# Patient Record
Sex: Male | Born: 1950 | Race: White | Hispanic: No | Smoking: Current every day smoker
Health system: Southern US, Community
[De-identification: ages and names within clinical notes are randomized; demographics above are authoritative.]

## PROBLEM LIST (undated history)

## (undated) DIAGNOSIS — R55 Syncope and collapse: Secondary | ICD-10-CM

## (undated) DIAGNOSIS — B1920 Unspecified viral hepatitis C without hepatic coma: Secondary | ICD-10-CM

## (undated) DIAGNOSIS — F13239 Sedative, hypnotic or anxiolytic dependence with withdrawal, unspecified: Secondary | ICD-10-CM

## (undated) DIAGNOSIS — F1320 Sedative, hypnotic or anxiolytic dependence, uncomplicated: Secondary | ICD-10-CM

## (undated) DIAGNOSIS — J449 Chronic obstructive pulmonary disease, unspecified: Secondary | ICD-10-CM

## (undated) DIAGNOSIS — F13939 Sedative, hypnotic or anxiolytic use, unspecified with withdrawal, unspecified: Secondary | ICD-10-CM

## (undated) DIAGNOSIS — I10 Essential (primary) hypertension: Secondary | ICD-10-CM

## (undated) DIAGNOSIS — R569 Unspecified convulsions: Secondary | ICD-10-CM

## (undated) DIAGNOSIS — C119 Malignant neoplasm of nasopharynx, unspecified: Secondary | ICD-10-CM

## (undated) DIAGNOSIS — F1010 Alcohol abuse, uncomplicated: Secondary | ICD-10-CM

## (undated) DIAGNOSIS — G4730 Sleep apnea, unspecified: Secondary | ICD-10-CM

## (undated) DIAGNOSIS — G8929 Other chronic pain: Secondary | ICD-10-CM

## (undated) DIAGNOSIS — F329 Major depressive disorder, single episode, unspecified: Secondary | ICD-10-CM

## (undated) DIAGNOSIS — M79671 Pain in right foot: Secondary | ICD-10-CM

## (undated) HISTORY — DX: Chronic obstructive pulmonary disease, unspecified: J44.9

## (undated) HISTORY — PX: ANKLE FRACTURE SURGERY: SHX122

## (undated) HISTORY — DX: Malignant neoplasm of nasopharynx, unspecified: C11.9

## (undated) HISTORY — PX: OTHER SURGICAL HISTORY: SHX169

## (undated) HISTORY — PX: COLONOSCOPY WITH PROPOFOL: SHX5780

## (undated) HISTORY — PX: NASOPHARYNGOSCOPY: SHX5210

## (undated) HISTORY — PX: FOOT SURGERY: SHX648

## (undated) HISTORY — PX: MYRINGOTOMY: SUR874

## (undated) HISTORY — PX: TONSILLECTOMY: SUR1361

## (undated) HISTORY — PX: LITHOTRIPSY: SUR834

## (undated) HISTORY — PX: HEMORRHOID SURGERY: SHX153

---

## 2002-03-12 HISTORY — PX: FOOT SURGERY: SHX648

## 2004-03-24 ENCOUNTER — Emergency Department: Payer: Self-pay | Admitting: Emergency Medicine

## 2004-03-24 ENCOUNTER — Other Ambulatory Visit: Payer: Self-pay

## 2004-07-23 ENCOUNTER — Ambulatory Visit: Payer: Self-pay | Admitting: Internal Medicine

## 2004-08-02 ENCOUNTER — Ambulatory Visit: Payer: Self-pay | Admitting: Internal Medicine

## 2006-01-10 ENCOUNTER — Other Ambulatory Visit: Payer: Self-pay

## 2006-01-11 ENCOUNTER — Inpatient Hospital Stay: Payer: Self-pay | Admitting: Internal Medicine

## 2006-02-18 ENCOUNTER — Ambulatory Visit: Payer: Self-pay | Admitting: Internal Medicine

## 2007-10-15 ENCOUNTER — Ambulatory Visit: Payer: Self-pay | Admitting: Pain Medicine

## 2007-10-21 ENCOUNTER — Ambulatory Visit: Payer: Self-pay | Admitting: Pain Medicine

## 2007-10-26 ENCOUNTER — Ambulatory Visit: Payer: Self-pay | Admitting: Pain Medicine

## 2007-11-26 ENCOUNTER — Ambulatory Visit: Payer: Self-pay | Admitting: Pain Medicine

## 2007-12-07 ENCOUNTER — Ambulatory Visit: Payer: Self-pay | Admitting: Pain Medicine

## 2008-01-12 ENCOUNTER — Ambulatory Visit: Payer: Self-pay | Admitting: Pain Medicine

## 2008-01-20 ENCOUNTER — Ambulatory Visit: Payer: Self-pay | Admitting: Pain Medicine

## 2008-02-16 ENCOUNTER — Ambulatory Visit: Payer: Self-pay | Admitting: Pain Medicine

## 2008-02-22 ENCOUNTER — Ambulatory Visit: Payer: Self-pay | Admitting: Pain Medicine

## 2008-03-13 ENCOUNTER — Ambulatory Visit: Payer: Self-pay | Admitting: Internal Medicine

## 2008-03-29 ENCOUNTER — Ambulatory Visit: Payer: Self-pay | Admitting: Internal Medicine

## 2008-03-31 ENCOUNTER — Ambulatory Visit: Payer: Self-pay | Admitting: Pain Medicine

## 2008-04-11 ENCOUNTER — Ambulatory Visit: Payer: Self-pay | Admitting: Pain Medicine

## 2008-04-12 ENCOUNTER — Ambulatory Visit: Payer: Self-pay | Admitting: Internal Medicine

## 2008-04-22 ENCOUNTER — Encounter: Payer: Self-pay | Admitting: Pain Medicine

## 2008-05-13 ENCOUNTER — Encounter: Payer: Self-pay | Admitting: Pain Medicine

## 2008-05-13 DIAGNOSIS — R569 Unspecified convulsions: Secondary | ICD-10-CM

## 2008-05-13 HISTORY — DX: Unspecified convulsions: R56.9

## 2008-05-17 ENCOUNTER — Ambulatory Visit: Payer: Self-pay | Admitting: Pain Medicine

## 2008-05-30 ENCOUNTER — Ambulatory Visit: Payer: Self-pay

## 2008-06-09 ENCOUNTER — Ambulatory Visit: Payer: Self-pay | Admitting: Pain Medicine

## 2008-08-01 ENCOUNTER — Ambulatory Visit: Payer: Self-pay | Admitting: Specialist

## 2008-08-29 ENCOUNTER — Ambulatory Visit: Payer: Self-pay | Admitting: Pain Medicine

## 2008-09-29 ENCOUNTER — Ambulatory Visit: Payer: Self-pay | Admitting: Pain Medicine

## 2009-05-09 ENCOUNTER — Emergency Department: Payer: Self-pay | Admitting: Emergency Medicine

## 2009-07-07 ENCOUNTER — Encounter (INDEPENDENT_AMBULATORY_CARE_PROVIDER_SITE_OTHER): Payer: Self-pay | Admitting: *Deleted

## 2009-07-13 ENCOUNTER — Observation Stay: Payer: Self-pay | Admitting: Internal Medicine

## 2009-12-01 ENCOUNTER — Inpatient Hospital Stay: Payer: Self-pay | Admitting: *Deleted

## 2009-12-04 ENCOUNTER — Inpatient Hospital Stay: Payer: Self-pay | Admitting: Unknown Physician Specialty

## 2010-02-14 ENCOUNTER — Telehealth: Payer: Self-pay | Admitting: Internal Medicine

## 2010-04-21 ENCOUNTER — Inpatient Hospital Stay: Payer: Self-pay | Admitting: Internal Medicine

## 2010-06-12 NOTE — Progress Notes (Signed)
Summary: Schedule Office Visit.   Phone Note Outgoing Call Call back at [facility] Phone [phone]   Call placed by: Harlow Mares CMA Duncan Dull),  February 14, 2010 9:50 AM Call placed to: Patient Summary of Call: patient lives in [place] and plans on asking his primary care MD to refer him to a GI in [place] to have his recall colonoscopy done.  Initial call taken by: Harlow Mares CMA (AAMA),  February 14, 2010 9:51 AM

## 2010-06-12 NOTE — Letter (Signed)
Summary: Office Visit Letter  [facility]  [address] [place], [place] [address]   Phone: [phone]  Fax: [phone]      July 07, 2009 MRN: [id number]   [facility] [address] [place], [place]  [address]   Dear Mr. KIESTER,   According to our records, it is time for you to schedule a follow-up office visit with [place].   At your convenience, please call [phone] (option #2)to schedule an office visit. If you have any questions, concerns, or feel that this letter is in error, we would appreciate your call.   Sincerely,  Iva Boop, M.D.  [facility] [phone]

## 2010-11-13 ENCOUNTER — Emergency Department: Payer: Self-pay | Admitting: Internal Medicine

## 2010-12-14 ENCOUNTER — Other Ambulatory Visit: Payer: Self-pay | Admitting: Diagnostic Neuroimaging

## 2010-12-14 DIAGNOSIS — R569 Unspecified convulsions: Secondary | ICD-10-CM

## 2010-12-14 DIAGNOSIS — R404 Transient alteration of awareness: Secondary | ICD-10-CM

## 2011-03-20 ENCOUNTER — Ambulatory Visit: Payer: Self-pay

## 2011-03-21 LAB — PSA: PSA: 0.4 ng/mL (ref 0.0–4.0)

## 2011-03-26 ENCOUNTER — Ambulatory Visit: Payer: Self-pay | Admitting: Internal Medicine

## 2011-08-08 DIAGNOSIS — I10 Essential (primary) hypertension: Secondary | ICD-10-CM | POA: Diagnosis not present

## 2011-08-08 DIAGNOSIS — E782 Mixed hyperlipidemia: Secondary | ICD-10-CM | POA: Diagnosis not present

## 2011-08-08 DIAGNOSIS — G40909 Epilepsy, unspecified, not intractable, without status epilepticus: Secondary | ICD-10-CM | POA: Diagnosis not present

## 2011-08-08 DIAGNOSIS — M25579 Pain in unspecified ankle and joints of unspecified foot: Secondary | ICD-10-CM | POA: Diagnosis not present

## 2011-08-08 DIAGNOSIS — F411 Generalized anxiety disorder: Secondary | ICD-10-CM | POA: Diagnosis not present

## 2011-08-08 DIAGNOSIS — B182 Chronic viral hepatitis C: Secondary | ICD-10-CM | POA: Diagnosis not present

## 2011-10-16 ENCOUNTER — Inpatient Hospital Stay: Payer: Self-pay | Admitting: Psychiatry

## 2011-10-16 DIAGNOSIS — F332 Major depressive disorder, recurrent severe without psychotic features: Secondary | ICD-10-CM | POA: Diagnosis not present

## 2011-10-16 LAB — COMPREHENSIVE METABOLIC PANEL
Albumin: 4.5 g/dL (ref 3.4–5.0)
Alkaline Phosphatase: 82 U/L (ref 50–136)
Anion Gap: 10 (ref 7–16)
Chloride: 100 mmol/L (ref 98–107)
Co2: 29 mmol/L (ref 21–32)
EGFR (African American): >60
EGFR (Non-African Amer.): >60
Glucose: 108 mg/dL — ABNORMAL HIGH (ref 65–99)
Potassium: 3.5 mmol/L (ref 3.5–5.1)
Total Protein: 8.4 g/dL — ABNORMAL HIGH (ref 6.4–8.2)

## 2011-10-16 LAB — URINALYSIS, COMPLETE
Bacteria: NONE SEEN
Ketone: NEGATIVE
Nitrite: NEGATIVE
Ph: 6 (ref 4.5–8.0)
RBC,UR: NONE SEEN /HPF (ref 0–5)
Specific Gravity: 1.005 (ref 1.003–1.030)
Squamous Epithelial: NONE SEEN

## 2011-10-16 LAB — CBC
HGB: 17.6 g/dL (ref 13.0–18.0)
MCH: 31.4 pg (ref 26.0–34.0)
MCHC: 33.4 g/dL (ref 32.0–36.0)
MCV: 94 fL (ref 80–100)
RBC: 5.6 10*6/uL (ref 4.40–5.90)
WBC: 8.1 10*3/uL (ref 3.8–10.6)

## 2011-10-16 LAB — DRUG SCREEN, URINE
Amphetamines, Ur Screen: NEGATIVE (ref ?–1000)
Barbiturates, Ur Screen: NEGATIVE (ref ?–200)
Benzodiazepine, Ur Scrn: POSITIVE (ref ?–200)
Cocaine Metabolite,Ur ~~LOC~~: NEGATIVE (ref ?–300)
MDMA (Ecstasy)Ur Screen: NEGATIVE (ref ?–500)
Methadone, Ur Screen: NEGATIVE (ref ?–300)
Opiate, Ur Screen: NEGATIVE (ref ?–300)
Phencyclidine (PCP) Ur S: NEGATIVE (ref ?–25)
Tricyclic, Ur Screen: NEGATIVE (ref ?–1000)

## 2011-10-16 LAB — TSH: Thyroid Stimulating Horm: 1.54 u[IU]/mL

## 2011-10-18 DIAGNOSIS — F332 Major depressive disorder, recurrent severe without psychotic features: Secondary | ICD-10-CM | POA: Diagnosis not present

## 2011-10-19 DIAGNOSIS — F332 Major depressive disorder, recurrent severe without psychotic features: Secondary | ICD-10-CM | POA: Diagnosis not present

## 2011-10-20 DIAGNOSIS — F332 Major depressive disorder, recurrent severe without psychotic features: Secondary | ICD-10-CM | POA: Diagnosis not present

## 2011-10-21 DIAGNOSIS — F332 Major depressive disorder, recurrent severe without psychotic features: Secondary | ICD-10-CM | POA: Diagnosis not present

## 2011-10-23 DIAGNOSIS — F332 Major depressive disorder, recurrent severe without psychotic features: Secondary | ICD-10-CM | POA: Diagnosis not present

## 2011-10-24 DIAGNOSIS — F332 Major depressive disorder, recurrent severe without psychotic features: Secondary | ICD-10-CM | POA: Diagnosis not present

## 2011-10-25 DIAGNOSIS — F332 Major depressive disorder, recurrent severe without psychotic features: Secondary | ICD-10-CM | POA: Diagnosis not present

## 2011-10-26 DIAGNOSIS — F331 Major depressive disorder, recurrent, moderate: Secondary | ICD-10-CM | POA: Diagnosis not present

## 2011-10-26 DIAGNOSIS — F4010 Social phobia, unspecified: Secondary | ICD-10-CM | POA: Diagnosis not present

## 2011-10-27 DIAGNOSIS — F331 Major depressive disorder, recurrent, moderate: Secondary | ICD-10-CM | POA: Diagnosis not present

## 2011-10-27 DIAGNOSIS — F4010 Social phobia, unspecified: Secondary | ICD-10-CM | POA: Diagnosis not present

## 2011-10-28 DIAGNOSIS — F332 Major depressive disorder, recurrent severe without psychotic features: Secondary | ICD-10-CM | POA: Diagnosis not present

## 2011-10-29 ENCOUNTER — Ambulatory Visit: Payer: Self-pay | Admitting: Unknown Physician Specialty

## 2011-10-30 DIAGNOSIS — F332 Major depressive disorder, recurrent severe without psychotic features: Secondary | ICD-10-CM | POA: Diagnosis not present

## 2011-10-30 DIAGNOSIS — F411 Generalized anxiety disorder: Secondary | ICD-10-CM | POA: Diagnosis not present

## 2011-11-06 DIAGNOSIS — F332 Major depressive disorder, recurrent severe without psychotic features: Secondary | ICD-10-CM | POA: Diagnosis not present

## 2011-11-06 DIAGNOSIS — F411 Generalized anxiety disorder: Secondary | ICD-10-CM | POA: Diagnosis not present

## 2011-11-20 DIAGNOSIS — F332 Major depressive disorder, recurrent severe without psychotic features: Secondary | ICD-10-CM | POA: Diagnosis not present

## 2011-11-20 DIAGNOSIS — F411 Generalized anxiety disorder: Secondary | ICD-10-CM | POA: Diagnosis not present

## 2011-11-21 ENCOUNTER — Ambulatory Visit: Payer: Self-pay | Admitting: Unknown Physician Specialty

## 2011-12-09 ENCOUNTER — Other Ambulatory Visit: Payer: Self-pay

## 2011-12-09 DIAGNOSIS — G40909 Epilepsy, unspecified, not intractable, without status epilepticus: Secondary | ICD-10-CM | POA: Diagnosis not present

## 2011-12-09 DIAGNOSIS — R5381 Other malaise: Secondary | ICD-10-CM | POA: Diagnosis not present

## 2011-12-09 DIAGNOSIS — E782 Mixed hyperlipidemia: Secondary | ICD-10-CM | POA: Diagnosis not present

## 2011-12-09 DIAGNOSIS — I10 Essential (primary) hypertension: Secondary | ICD-10-CM | POA: Diagnosis not present

## 2011-12-09 DIAGNOSIS — R300 Dysuria: Secondary | ICD-10-CM | POA: Diagnosis not present

## 2011-12-09 DIAGNOSIS — Z125 Encounter for screening for malignant neoplasm of prostate: Secondary | ICD-10-CM | POA: Diagnosis not present

## 2011-12-09 DIAGNOSIS — B182 Chronic viral hepatitis C: Secondary | ICD-10-CM | POA: Diagnosis not present

## 2011-12-09 DIAGNOSIS — F411 Generalized anxiety disorder: Secondary | ICD-10-CM | POA: Diagnosis not present

## 2011-12-09 DIAGNOSIS — Z0000 Encounter for general adult medical examination without abnormal findings: Secondary | ICD-10-CM | POA: Diagnosis not present

## 2011-12-09 DIAGNOSIS — M159 Polyosteoarthritis, unspecified: Secondary | ICD-10-CM | POA: Diagnosis not present

## 2011-12-09 LAB — COMPREHENSIVE METABOLIC PANEL
Alkaline Phosphatase: 102 U/L (ref 50–136)
BUN: 8 mg/dL (ref 7–18)
Chloride: 103 mmol/L (ref 98–107)
Co2: 29 mmol/L (ref 21–32)
Creatinine: 0.86 mg/dL (ref 0.60–1.30)
EGFR (Non-African Amer.): >60
SGOT(AST): 51 U/L — ABNORMAL HIGH (ref 15–37)
SGPT (ALT): 77 U/L
Total Protein: 6.8 g/dL (ref 6.4–8.2)

## 2011-12-09 LAB — CBC WITH DIFFERENTIAL/PLATELET
HGB: 14.7 g/dL (ref 13.0–18.0)
MCH: 31.9 pg (ref 26.0–34.0)
MCHC: 34.9 g/dL (ref 32.0–36.0)
Neutrophil #: 3.2 10*3/uL (ref 1.4–6.5)
Neutrophil %: 57.3 %
Platelet: 155 10*3/uL (ref 150–440)
RBC: 4.61 10*6/uL (ref 4.40–5.90)
RDW: 14.1 % (ref 11.5–14.5)
WBC: 5.6 10*3/uL (ref 3.8–10.6)

## 2011-12-09 LAB — LIPID PANEL
Cholesterol: 175 mg/dL (ref 0–200)
HDL Cholesterol: 56 mg/dL (ref 40–60)
Triglycerides: 158 mg/dL (ref 0–200)

## 2011-12-10 LAB — PSA: PSA: 0.4 ng/mL (ref 0.0–4.0)

## 2011-12-12 ENCOUNTER — Ambulatory Visit: Payer: Self-pay | Admitting: Unknown Physician Specialty

## 2012-01-04 DIAGNOSIS — M159 Polyosteoarthritis, unspecified: Secondary | ICD-10-CM | POA: Diagnosis not present

## 2012-01-04 DIAGNOSIS — Z79899 Other long term (current) drug therapy: Secondary | ICD-10-CM | POA: Diagnosis not present

## 2012-01-04 DIAGNOSIS — F411 Generalized anxiety disorder: Secondary | ICD-10-CM | POA: Diagnosis not present

## 2012-01-04 DIAGNOSIS — I10 Essential (primary) hypertension: Secondary | ICD-10-CM | POA: Diagnosis not present

## 2012-01-04 DIAGNOSIS — G40909 Epilepsy, unspecified, not intractable, without status epilepticus: Secondary | ICD-10-CM | POA: Diagnosis not present

## 2012-01-10 DIAGNOSIS — F411 Generalized anxiety disorder: Secondary | ICD-10-CM | POA: Diagnosis not present

## 2012-01-10 DIAGNOSIS — Z79899 Other long term (current) drug therapy: Secondary | ICD-10-CM | POA: Diagnosis not present

## 2012-01-10 DIAGNOSIS — I10 Essential (primary) hypertension: Secondary | ICD-10-CM | POA: Diagnosis not present

## 2012-01-10 DIAGNOSIS — G40909 Epilepsy, unspecified, not intractable, without status epilepticus: Secondary | ICD-10-CM | POA: Diagnosis not present

## 2012-01-10 DIAGNOSIS — M159 Polyosteoarthritis, unspecified: Secondary | ICD-10-CM | POA: Diagnosis not present

## 2012-01-24 DIAGNOSIS — I10 Essential (primary) hypertension: Secondary | ICD-10-CM | POA: Diagnosis not present

## 2012-01-24 DIAGNOSIS — Z79899 Other long term (current) drug therapy: Secondary | ICD-10-CM | POA: Diagnosis not present

## 2012-01-24 DIAGNOSIS — G40909 Epilepsy, unspecified, not intractable, without status epilepticus: Secondary | ICD-10-CM | POA: Diagnosis not present

## 2012-01-24 DIAGNOSIS — M159 Polyosteoarthritis, unspecified: Secondary | ICD-10-CM | POA: Diagnosis not present

## 2012-01-24 DIAGNOSIS — F411 Generalized anxiety disorder: Secondary | ICD-10-CM | POA: Diagnosis not present

## 2012-02-21 DIAGNOSIS — IMO0002 Reserved for concepts with insufficient information to code with codable children: Secondary | ICD-10-CM | POA: Diagnosis not present

## 2012-09-22 DIAGNOSIS — I10 Essential (primary) hypertension: Secondary | ICD-10-CM | POA: Diagnosis not present

## 2012-09-22 DIAGNOSIS — Z006 Encounter for examination for normal comparison and control in clinical research program: Secondary | ICD-10-CM | POA: Diagnosis not present

## 2012-09-22 DIAGNOSIS — G40909 Epilepsy, unspecified, not intractable, without status epilepticus: Secondary | ICD-10-CM | POA: Diagnosis not present

## 2012-09-22 DIAGNOSIS — F411 Generalized anxiety disorder: Secondary | ICD-10-CM | POA: Diagnosis not present

## 2012-09-22 DIAGNOSIS — B182 Chronic viral hepatitis C: Secondary | ICD-10-CM | POA: Diagnosis not present

## 2012-12-18 DIAGNOSIS — M19079 Primary osteoarthritis, unspecified ankle and foot: Secondary | ICD-10-CM | POA: Diagnosis not present

## 2012-12-18 DIAGNOSIS — G40909 Epilepsy, unspecified, not intractable, without status epilepticus: Secondary | ICD-10-CM | POA: Diagnosis not present

## 2012-12-18 DIAGNOSIS — I10 Essential (primary) hypertension: Secondary | ICD-10-CM | POA: Diagnosis not present

## 2012-12-18 DIAGNOSIS — F411 Generalized anxiety disorder: Secondary | ICD-10-CM | POA: Diagnosis not present

## 2012-12-18 DIAGNOSIS — Z0000 Encounter for general adult medical examination without abnormal findings: Secondary | ICD-10-CM | POA: Diagnosis not present

## 2013-01-15 DIAGNOSIS — F411 Generalized anxiety disorder: Secondary | ICD-10-CM | POA: Diagnosis not present

## 2013-01-15 DIAGNOSIS — M79609 Pain in unspecified limb: Secondary | ICD-10-CM | POA: Diagnosis not present

## 2013-01-15 DIAGNOSIS — Z79899 Other long term (current) drug therapy: Secondary | ICD-10-CM | POA: Diagnosis not present

## 2013-01-15 DIAGNOSIS — M159 Polyosteoarthritis, unspecified: Secondary | ICD-10-CM | POA: Diagnosis not present

## 2013-01-15 DIAGNOSIS — M948X9 Other specified disorders of cartilage, unspecified sites: Secondary | ICD-10-CM | POA: Diagnosis not present

## 2013-04-09 ENCOUNTER — Emergency Department: Payer: Self-pay | Admitting: Emergency Medicine

## 2013-04-09 DIAGNOSIS — F17200 Nicotine dependence, unspecified, uncomplicated: Secondary | ICD-10-CM | POA: Diagnosis not present

## 2013-04-09 DIAGNOSIS — T07XXXA Unspecified multiple injuries, initial encounter: Secondary | ICD-10-CM | POA: Diagnosis not present

## 2013-04-09 DIAGNOSIS — W19XXXA Unspecified fall, initial encounter: Secondary | ICD-10-CM | POA: Diagnosis not present

## 2013-04-09 DIAGNOSIS — R569 Unspecified convulsions: Secondary | ICD-10-CM | POA: Diagnosis not present

## 2013-04-09 DIAGNOSIS — S0990XA Unspecified injury of head, initial encounter: Secondary | ICD-10-CM | POA: Diagnosis not present

## 2013-04-09 DIAGNOSIS — M549 Dorsalgia, unspecified: Secondary | ICD-10-CM | POA: Diagnosis not present

## 2013-04-09 DIAGNOSIS — IMO0002 Reserved for concepts with insufficient information to code with codable children: Secondary | ICD-10-CM | POA: Diagnosis not present

## 2013-04-09 DIAGNOSIS — M129 Arthropathy, unspecified: Secondary | ICD-10-CM | POA: Diagnosis not present

## 2013-04-09 DIAGNOSIS — B1920 Unspecified viral hepatitis C without hepatic coma: Secondary | ICD-10-CM | POA: Diagnosis not present

## 2013-04-09 DIAGNOSIS — S0003XA Contusion of scalp, initial encounter: Secondary | ICD-10-CM | POA: Diagnosis not present

## 2013-04-09 DIAGNOSIS — G40909 Epilepsy, unspecified, not intractable, without status epilepticus: Secondary | ICD-10-CM | POA: Diagnosis not present

## 2013-04-09 DIAGNOSIS — R5381 Other malaise: Secondary | ICD-10-CM | POA: Diagnosis not present

## 2013-04-09 DIAGNOSIS — F3289 Other specified depressive episodes: Secondary | ICD-10-CM | POA: Diagnosis not present

## 2013-04-09 DIAGNOSIS — F329 Major depressive disorder, single episode, unspecified: Secondary | ICD-10-CM | POA: Diagnosis not present

## 2013-04-09 DIAGNOSIS — I10 Essential (primary) hypertension: Secondary | ICD-10-CM | POA: Diagnosis not present

## 2013-04-09 DIAGNOSIS — S0993XA Unspecified injury of face, initial encounter: Secondary | ICD-10-CM | POA: Diagnosis not present

## 2013-04-09 DIAGNOSIS — G8911 Acute pain due to trauma: Secondary | ICD-10-CM | POA: Diagnosis not present

## 2013-04-09 LAB — URINALYSIS, COMPLETE
Blood: NEGATIVE
Glucose,UR: NEGATIVE mg/dL (ref 0–75)
Nitrite: NEGATIVE
Ph: 5 (ref 4.5–8.0)
Protein: 30
RBC,UR: NONE SEEN /HPF (ref 0–5)
Specific Gravity: 1.024 (ref 1.003–1.030)
Squamous Epithelial: NONE SEEN
WBC UR: NONE SEEN /HPF (ref 0–5)

## 2013-04-09 LAB — CBC
HCT: 50.1 % (ref 40.0–52.0)
HGB: 17 g/dL (ref 13.0–18.0)
MCV: 91 fL (ref 80–100)
Platelet: 161 10*3/uL (ref 150–440)
RBC: 5.51 10*6/uL (ref 4.40–5.90)
RDW: 13.6 % (ref 11.5–14.5)
WBC: 12 10*3/uL — ABNORMAL HIGH (ref 3.8–10.6)

## 2013-04-09 LAB — BASIC METABOLIC PANEL
Anion Gap: 6 — ABNORMAL LOW (ref 7–16)
Chloride: 103 mmol/L (ref 98–107)
Co2: 27 mmol/L (ref 21–32)
Creatinine: 1.08 mg/dL (ref 0.60–1.30)
EGFR (African American): >60
EGFR (Non-African Amer.): >60
Sodium: 136 mmol/L (ref 136–145)

## 2013-04-09 LAB — ETHANOL
Ethanol %: <0.003 % (ref 0.000–0.080)
Ethanol: <3 mg/dL

## 2013-04-13 ENCOUNTER — Inpatient Hospital Stay: Payer: Self-pay | Admitting: Psychiatry

## 2013-04-13 DIAGNOSIS — F17200 Nicotine dependence, unspecified, uncomplicated: Secondary | ICD-10-CM | POA: Diagnosis not present

## 2013-04-13 DIAGNOSIS — R4182 Altered mental status, unspecified: Secondary | ICD-10-CM | POA: Diagnosis not present

## 2013-04-13 DIAGNOSIS — R443 Hallucinations, unspecified: Secondary | ICD-10-CM | POA: Diagnosis not present

## 2013-04-13 DIAGNOSIS — T424X4A Poisoning by benzodiazepines, undetermined, initial encounter: Secondary | ICD-10-CM | POA: Diagnosis not present

## 2013-04-13 DIAGNOSIS — E876 Hypokalemia: Secondary | ICD-10-CM | POA: Diagnosis not present

## 2013-04-13 LAB — URINALYSIS, COMPLETE
Bacteria: NONE SEEN
Bilirubin,UR: NEGATIVE
Blood: NEGATIVE
Glucose,UR: NEGATIVE mg/dL (ref 0–75)
Hyaline Cast: 13
Ketone: NEGATIVE
Leukocyte Esterase: NEGATIVE
Nitrite: NEGATIVE
Ph: 5 (ref 4.5–8.0)
Protein: 30
Specific Gravity: 1.025 (ref 1.003–1.030)
Squamous Epithelial: NONE SEEN

## 2013-04-13 LAB — COMPREHENSIVE METABOLIC PANEL
Albumin: 4 g/dL (ref 3.4–5.0)
Alkaline Phosphatase: 48 U/L
EGFR (African American): >60
EGFR (Non-African Amer.): >60
Sodium: 137 mmol/L (ref 136–145)
Total Protein: 7.5 g/dL (ref 6.4–8.2)

## 2013-04-13 LAB — DRUG SCREEN, URINE
Amphetamines, Ur Screen: NEGATIVE (ref ?–1000)
Barbiturates, Ur Screen: NEGATIVE (ref ?–200)
Cocaine Metabolite,Ur ~~LOC~~: NEGATIVE (ref ?–300)
MDMA (Ecstasy)Ur Screen: NEGATIVE (ref ?–500)
Tricyclic, Ur Screen: NEGATIVE (ref ?–1000)

## 2013-04-13 LAB — CBC
HCT: 49.3 % (ref 40.0–52.0)
HGB: 16.7 g/dL (ref 13.0–18.0)
MCV: 92 fL (ref 80–100)

## 2013-04-14 LAB — BEHAVIORAL MEDICINE 1 PANEL
Albumin: 4.1 g/dL (ref 3.4–5.0)
Alkaline Phosphatase: 51 U/L
Anion Gap: 8 (ref 7–16)
BUN: 13 mg/dL (ref 7–18)
Basophil #: 0 10*3/uL (ref 0.0–0.1)
Basophil %: 0.6 %
Bilirubin,Total: 1 mg/dL (ref 0.2–1.0)
Calcium, Total: 9.1 mg/dL (ref 8.5–10.1)
Chloride: 102 mmol/L (ref 98–107)
Co2: 27 mmol/L (ref 21–32)
Creatinine: 0.91 mg/dL (ref 0.60–1.30)
EGFR (African American): >60
EGFR (Non-African Amer.): >60
Eosinophil #: 0.3 10*3/uL (ref 0.0–0.7)
Eosinophil %: 3.5 %
Glucose: 119 mg/dL — ABNORMAL HIGH (ref 65–99)
HCT: 51.1 % (ref 40.0–52.0)
HGB: 17 g/dL (ref 13.0–18.0)
Lymphocyte #: 1.8 10*3/uL (ref 1.0–3.6)
Lymphocyte %: 25.1 %
MCH: 30.4 pg (ref 26.0–34.0)
MCHC: 33.2 g/dL (ref 32.0–36.0)
MCV: 92 fL (ref 80–100)
Monocyte #: 0.8 x10 3/mm (ref 0.2–1.0)
Monocyte %: 11.4 %
Neutrophil #: 4.3 10*3/uL (ref 1.4–6.5)
Neutrophil %: 59.4 %
Osmolality: 275 (ref 275–301)
Platelet: 146 10*3/uL — ABNORMAL LOW (ref 150–440)
Potassium: 3.4 mmol/L — ABNORMAL LOW (ref 3.5–5.1)
RBC: 5.58 10*6/uL (ref 4.40–5.90)
RDW: 13.3 % (ref 11.5–14.5)
SGOT(AST): 31 U/L (ref 15–37)
SGPT (ALT): 32 U/L (ref 12–78)
Sodium: 137 mmol/L (ref 136–145)
Thyroid Stimulating Horm: 1.35 u[IU]/mL
Total Protein: 7.8 g/dL (ref 6.4–8.2)
WBC: 7.2 10*3/uL (ref 3.8–10.6)

## 2013-04-17 DIAGNOSIS — F319 Bipolar disorder, unspecified: Secondary | ICD-10-CM | POA: Diagnosis not present

## 2013-04-18 DIAGNOSIS — F319 Bipolar disorder, unspecified: Secondary | ICD-10-CM | POA: Diagnosis not present

## 2013-04-21 ENCOUNTER — Ambulatory Visit: Payer: Self-pay | Admitting: Psychiatry

## 2013-05-14 ENCOUNTER — Ambulatory Visit: Payer: Self-pay | Admitting: Psychiatry

## 2013-06-07 ENCOUNTER — Observation Stay: Payer: Self-pay | Admitting: Internal Medicine

## 2013-06-07 DIAGNOSIS — R799 Abnormal finding of blood chemistry, unspecified: Secondary | ICD-10-CM | POA: Diagnosis not present

## 2013-06-07 DIAGNOSIS — R55 Syncope and collapse: Secondary | ICD-10-CM | POA: Diagnosis not present

## 2013-06-07 DIAGNOSIS — I10 Essential (primary) hypertension: Secondary | ICD-10-CM | POA: Diagnosis not present

## 2013-06-07 DIAGNOSIS — F17200 Nicotine dependence, unspecified, uncomplicated: Secondary | ICD-10-CM | POA: Diagnosis not present

## 2013-06-07 LAB — HEPATIC FUNCTION PANEL A (ARMC)
ALBUMIN: 4.3 g/dL (ref 3.4–5.0)
ALK PHOS: 79 U/L
BILIRUBIN DIRECT: 0.4 mg/dL — AB (ref 0.00–0.20)
Bilirubin,Total: 0.9 mg/dL (ref 0.2–1.0)
SGOT(AST): 42 U/L — ABNORMAL HIGH (ref 15–37)
SGPT (ALT): 31 U/L (ref 12–78)
TOTAL PROTEIN: 7.8 g/dL (ref 6.4–8.2)

## 2013-06-07 LAB — DRUG SCREEN, URINE
Amphetamines, Ur Screen: NEGATIVE (ref ?–1000)
Barbiturates, Ur Screen: NEGATIVE (ref ?–200)
Benzodiazepine, Ur Scrn: POSITIVE (ref ?–200)
Cannabinoid 50 Ng, Ur ~~LOC~~: NEGATIVE (ref ?–50)
Cocaine Metabolite,Ur ~~LOC~~: NEGATIVE (ref ?–300)
MDMA (ECSTASY) UR SCREEN: NEGATIVE (ref ?–500)
Methadone, Ur Screen: NEGATIVE (ref ?–300)
OPIATE, UR SCREEN: NEGATIVE (ref ?–300)
PHENCYCLIDINE (PCP) UR S: NEGATIVE (ref ?–25)
TRICYCLIC, UR SCREEN: NEGATIVE (ref ?–1000)

## 2013-06-07 LAB — URINALYSIS, COMPLETE
BLOOD: NEGATIVE
Bacteria: NONE SEEN
Bilirubin,UR: NEGATIVE
Glucose,UR: NEGATIVE mg/dL (ref 0–75)
Leukocyte Esterase: NEGATIVE
Nitrite: NEGATIVE
PH: 5 (ref 4.5–8.0)
Specific Gravity: 1.019 (ref 1.003–1.030)
Squamous Epithelial: <1
WBC UR: 5 /HPF (ref 0–5)

## 2013-06-07 LAB — CBC WITH DIFFERENTIAL/PLATELET
BASOS ABS: 0.1 10*3/uL (ref 0.0–0.1)
Basophil %: 0.6 %
EOS PCT: 0.6 %
Eosinophil #: 0.1 10*3/uL (ref 0.0–0.7)
HCT: 52.3 % — AB (ref 40.0–52.0)
HGB: 17.4 g/dL (ref 13.0–18.0)
LYMPHS ABS: 1.4 10*3/uL (ref 1.0–3.6)
LYMPHS PCT: 11.4 %
MCH: 30.8 pg (ref 26.0–34.0)
MCHC: 33.3 g/dL (ref 32.0–36.0)
MCV: 92 fL (ref 80–100)
MONO ABS: 1 x10 3/mm (ref 0.2–1.0)
Monocyte %: 8.4 %
NEUTROS PCT: 79 %
Neutrophil #: 9.5 10*3/uL — ABNORMAL HIGH (ref 1.4–6.5)
PLATELETS: 203 10*3/uL (ref 150–440)
RBC: 5.66 10*6/uL (ref 4.40–5.90)
RDW: 15.2 % — ABNORMAL HIGH (ref 11.5–14.5)
WBC: 12.1 10*3/uL — AB (ref 3.8–10.6)

## 2013-06-07 LAB — BASIC METABOLIC PANEL
Anion Gap: 6 — ABNORMAL LOW (ref 7–16)
BUN: 5 mg/dL — ABNORMAL LOW (ref 7–18)
CO2: 29 mmol/L (ref 21–32)
Calcium, Total: 9.4 mg/dL (ref 8.5–10.1)
Chloride: 99 mmol/L (ref 98–107)
Creatinine: 1.12 mg/dL (ref 0.60–1.30)
EGFR (African American): >60
EGFR (Non-African Amer.): >60
Glucose: 159 mg/dL — ABNORMAL HIGH (ref 65–99)
Osmolality: 269 (ref 275–301)
Potassium: 3.6 mmol/L (ref 3.5–5.1)
Sodium: 134 mmol/L — ABNORMAL LOW (ref 136–145)

## 2013-06-07 LAB — ETHANOL
Ethanol %: <0.003 % (ref 0.000–0.080)
Ethanol: <3 mg/dL

## 2013-06-07 LAB — TROPONIN I
Troponin-I: 0.08 ng/mL — ABNORMAL HIGH
Troponin-I: 0.16 ng/mL — ABNORMAL HIGH

## 2013-06-08 DIAGNOSIS — R55 Syncope and collapse: Secondary | ICD-10-CM | POA: Diagnosis not present

## 2013-06-08 DIAGNOSIS — R799 Abnormal finding of blood chemistry, unspecified: Secondary | ICD-10-CM | POA: Diagnosis not present

## 2013-06-08 DIAGNOSIS — R569 Unspecified convulsions: Secondary | ICD-10-CM | POA: Diagnosis not present

## 2013-06-08 DIAGNOSIS — I10 Essential (primary) hypertension: Secondary | ICD-10-CM | POA: Diagnosis not present

## 2013-06-08 LAB — CBC WITH DIFFERENTIAL/PLATELET
BASOS PCT: 0.4 %
Basophil #: 0 10*3/uL (ref 0.0–0.1)
EOS PCT: 1.3 %
Eosinophil #: 0.1 10*3/uL (ref 0.0–0.7)
HCT: 49.1 % (ref 40.0–52.0)
HGB: 16.3 g/dL (ref 13.0–18.0)
Lymphocyte #: 2.8 10*3/uL (ref 1.0–3.6)
Lymphocyte %: 26.2 %
MCH: 30.4 pg (ref 26.0–34.0)
MCHC: 33.2 g/dL (ref 32.0–36.0)
MCV: 92 fL (ref 80–100)
Monocyte #: 0.9 x10 3/mm (ref 0.2–1.0)
Monocyte %: 8.5 %
NEUTROS ABS: 6.7 10*3/uL — AB (ref 1.4–6.5)
Neutrophil %: 63.6 %
Platelet: 154 10*3/uL (ref 150–440)
RBC: 5.36 10*6/uL (ref 4.40–5.90)
RDW: 14.7 % — AB (ref 11.5–14.5)
WBC: 10.6 10*3/uL (ref 3.8–10.6)

## 2013-06-08 LAB — COMPREHENSIVE METABOLIC PANEL
ANION GAP: 0 — AB (ref 7–16)
Albumin: 3.5 g/dL (ref 3.4–5.0)
Alkaline Phosphatase: 66 U/L
BILIRUBIN TOTAL: 1.2 mg/dL — AB (ref 0.2–1.0)
BUN: 3 mg/dL — AB (ref 7–18)
Calcium, Total: 8.4 mg/dL — ABNORMAL LOW (ref 8.5–10.1)
Chloride: 105 mmol/L (ref 98–107)
Co2: 27 mmol/L (ref 21–32)
Creatinine: 0.85 mg/dL (ref 0.60–1.30)
GLUCOSE: 94 mg/dL (ref 65–99)
Osmolality: 261 (ref 275–301)
Potassium: 3.2 mmol/L — ABNORMAL LOW (ref 3.5–5.1)
SGOT(AST): 30 U/L (ref 15–37)
SGPT (ALT): 23 U/L (ref 12–78)
Sodium: 132 mmol/L — ABNORMAL LOW (ref 136–145)
Total Protein: 6.6 g/dL (ref 6.4–8.2)

## 2013-06-08 LAB — TROPONIN I: Troponin-I: 0.13 ng/mL — ABNORMAL HIGH

## 2013-06-08 LAB — CK: CK, Total: 162 U/L (ref 35–232)

## 2013-06-08 LAB — MAGNESIUM: MAGNESIUM: 1.7 mg/dL — AB

## 2013-06-10 DIAGNOSIS — R569 Unspecified convulsions: Secondary | ICD-10-CM | POA: Diagnosis not present

## 2013-06-13 ENCOUNTER — Ambulatory Visit: Payer: Self-pay | Admitting: Psychiatry

## 2013-06-14 ENCOUNTER — Emergency Department: Payer: Self-pay | Admitting: Emergency Medicine

## 2013-06-22 DIAGNOSIS — M19079 Primary osteoarthritis, unspecified ankle and foot: Secondary | ICD-10-CM | POA: Diagnosis not present

## 2013-06-22 DIAGNOSIS — F411 Generalized anxiety disorder: Secondary | ICD-10-CM | POA: Diagnosis not present

## 2013-06-22 DIAGNOSIS — G40909 Epilepsy, unspecified, not intractable, without status epilepticus: Secondary | ICD-10-CM | POA: Diagnosis not present

## 2013-06-22 DIAGNOSIS — E876 Hypokalemia: Secondary | ICD-10-CM | POA: Diagnosis not present

## 2013-06-22 DIAGNOSIS — R55 Syncope and collapse: Secondary | ICD-10-CM | POA: Diagnosis not present

## 2013-06-22 DIAGNOSIS — I10 Essential (primary) hypertension: Secondary | ICD-10-CM | POA: Diagnosis not present

## 2013-07-13 DIAGNOSIS — G40909 Epilepsy, unspecified, not intractable, without status epilepticus: Secondary | ICD-10-CM | POA: Diagnosis not present

## 2013-08-24 DIAGNOSIS — E876 Hypokalemia: Secondary | ICD-10-CM | POA: Diagnosis not present

## 2013-08-24 DIAGNOSIS — I10 Essential (primary) hypertension: Secondary | ICD-10-CM | POA: Diagnosis not present

## 2013-08-24 DIAGNOSIS — M25579 Pain in unspecified ankle and joints of unspecified foot: Secondary | ICD-10-CM | POA: Diagnosis not present

## 2013-08-24 DIAGNOSIS — G40909 Epilepsy, unspecified, not intractable, without status epilepticus: Secondary | ICD-10-CM | POA: Diagnosis not present

## 2013-08-24 DIAGNOSIS — M19079 Primary osteoarthritis, unspecified ankle and foot: Secondary | ICD-10-CM | POA: Diagnosis not present

## 2013-10-20 DIAGNOSIS — M159 Polyosteoarthritis, unspecified: Secondary | ICD-10-CM | POA: Diagnosis not present

## 2013-10-20 DIAGNOSIS — E782 Mixed hyperlipidemia: Secondary | ICD-10-CM | POA: Diagnosis not present

## 2013-10-20 DIAGNOSIS — G40909 Epilepsy, unspecified, not intractable, without status epilepticus: Secondary | ICD-10-CM | POA: Diagnosis not present

## 2013-10-20 DIAGNOSIS — I10 Essential (primary) hypertension: Secondary | ICD-10-CM | POA: Diagnosis not present

## 2013-10-20 DIAGNOSIS — M25579 Pain in unspecified ankle and joints of unspecified foot: Secondary | ICD-10-CM | POA: Diagnosis not present

## 2013-10-20 DIAGNOSIS — G4700 Insomnia, unspecified: Secondary | ICD-10-CM | POA: Diagnosis not present

## 2013-10-20 DIAGNOSIS — F411 Generalized anxiety disorder: Secondary | ICD-10-CM | POA: Diagnosis not present

## 2013-11-26 DIAGNOSIS — F411 Generalized anxiety disorder: Secondary | ICD-10-CM | POA: Diagnosis not present

## 2013-11-26 DIAGNOSIS — E782 Mixed hyperlipidemia: Secondary | ICD-10-CM | POA: Diagnosis not present

## 2013-11-26 DIAGNOSIS — G40909 Epilepsy, unspecified, not intractable, without status epilepticus: Secondary | ICD-10-CM | POA: Diagnosis not present

## 2013-11-26 DIAGNOSIS — I10 Essential (primary) hypertension: Secondary | ICD-10-CM | POA: Diagnosis not present

## 2013-11-26 DIAGNOSIS — M19079 Primary osteoarthritis, unspecified ankle and foot: Secondary | ICD-10-CM | POA: Diagnosis not present

## 2013-11-26 DIAGNOSIS — IMO0001 Reserved for inherently not codable concepts without codable children: Secondary | ICD-10-CM | POA: Diagnosis not present

## 2013-11-26 DIAGNOSIS — B182 Chronic viral hepatitis C: Secondary | ICD-10-CM | POA: Diagnosis not present

## 2013-11-29 ENCOUNTER — Emergency Department (HOSPITAL_COMMUNITY): Payer: Medicare Other

## 2013-11-29 ENCOUNTER — Observation Stay (HOSPITAL_COMMUNITY)
Admission: EM | Admit: 2013-11-29 | Discharge: 2013-11-30 | Disposition: A | Discharge status: Home / Self Care | Payer: Medicare Other | Attending: Internal Medicine | Admitting: Internal Medicine

## 2013-11-29 ENCOUNTER — Encounter (HOSPITAL_COMMUNITY): Payer: Self-pay | Admitting: Emergency Medicine

## 2013-11-29 ENCOUNTER — Observation Stay (HOSPITAL_COMMUNITY): Payer: Medicare Other

## 2013-11-29 DIAGNOSIS — B1920 Unspecified viral hepatitis C without hepatic coma: Secondary | ICD-10-CM | POA: Diagnosis not present

## 2013-11-29 DIAGNOSIS — E669 Obesity, unspecified: Secondary | ICD-10-CM | POA: Diagnosis present

## 2013-11-29 DIAGNOSIS — F19921 Other psychoactive substance use, unspecified with intoxication with delirium: Secondary | ICD-10-CM

## 2013-11-29 DIAGNOSIS — Z6830 Body mass index (BMI) 30.0-30.9, adult: Secondary | ICD-10-CM | POA: Insufficient documentation

## 2013-11-29 DIAGNOSIS — F1010 Alcohol abuse, uncomplicated: Secondary | ICD-10-CM | POA: Diagnosis present

## 2013-11-29 DIAGNOSIS — F1320 Sedative, hypnotic or anxiolytic dependence, uncomplicated: Secondary | ICD-10-CM

## 2013-11-29 DIAGNOSIS — F411 Generalized anxiety disorder: Secondary | ICD-10-CM | POA: Insufficient documentation

## 2013-11-29 DIAGNOSIS — R9389 Abnormal findings on diagnostic imaging of other specified body structures: Secondary | ICD-10-CM | POA: Diagnosis present

## 2013-11-29 DIAGNOSIS — R4789 Other speech disturbances: Secondary | ICD-10-CM | POA: Diagnosis not present

## 2013-11-29 DIAGNOSIS — R4182 Altered mental status, unspecified: Secondary | ICD-10-CM | POA: Diagnosis not present

## 2013-11-29 DIAGNOSIS — G40909 Epilepsy, unspecified, not intractable, without status epilepticus: Secondary | ICD-10-CM | POA: Diagnosis not present

## 2013-11-29 DIAGNOSIS — F13931 Sedative, hypnotic or anxiolytic use, unspecified with withdrawal delirium: Secondary | ICD-10-CM

## 2013-11-29 DIAGNOSIS — F13939 Sedative, hypnotic or anxiolytic use, unspecified with withdrawal, unspecified: Secondary | ICD-10-CM | POA: Diagnosis present

## 2013-11-29 DIAGNOSIS — F13231 Sedative, hypnotic or anxiolytic dependence with withdrawal delirium: Secondary | ICD-10-CM

## 2013-11-29 DIAGNOSIS — F4489 Other dissociative and conversion disorders: Secondary | ICD-10-CM | POA: Diagnosis not present

## 2013-11-29 DIAGNOSIS — J984 Other disorders of lung: Secondary | ICD-10-CM | POA: Diagnosis not present

## 2013-11-29 DIAGNOSIS — G8929 Other chronic pain: Secondary | ICD-10-CM | POA: Diagnosis not present

## 2013-11-29 DIAGNOSIS — I10 Essential (primary) hypertension: Secondary | ICD-10-CM | POA: Diagnosis present

## 2013-11-29 DIAGNOSIS — G9349 Other encephalopathy: Principal | ICD-10-CM | POA: Insufficient documentation

## 2013-11-29 DIAGNOSIS — F17200 Nicotine dependence, unspecified, uncomplicated: Secondary | ICD-10-CM | POA: Diagnosis present

## 2013-11-29 DIAGNOSIS — F13239 Sedative, hypnotic or anxiolytic dependence with withdrawal, unspecified: Secondary | ICD-10-CM | POA: Diagnosis present

## 2013-11-29 DIAGNOSIS — F19239 Other psychoactive substance dependence with withdrawal, unspecified: Secondary | ICD-10-CM

## 2013-11-29 DIAGNOSIS — R918 Other nonspecific abnormal finding of lung field: Secondary | ICD-10-CM | POA: Diagnosis not present

## 2013-11-29 DIAGNOSIS — R569 Unspecified convulsions: Secondary | ICD-10-CM | POA: Diagnosis not present

## 2013-11-29 DIAGNOSIS — R51 Headache: Secondary | ICD-10-CM | POA: Diagnosis not present

## 2013-11-29 DIAGNOSIS — R6889 Other general symptoms and signs: Secondary | ICD-10-CM | POA: Diagnosis not present

## 2013-11-29 DIAGNOSIS — F19939 Other psychoactive substance use, unspecified with withdrawal, unspecified: Secondary | ICD-10-CM

## 2013-11-29 HISTORY — DX: Sedative, hypnotic or anxiolytic dependence, uncomplicated: F13.20

## 2013-11-29 HISTORY — DX: Sedative, hypnotic or anxiolytic use, unspecified with withdrawal, unspecified: F13.939

## 2013-11-29 HISTORY — DX: Other chronic pain: G89.29

## 2013-11-29 HISTORY — DX: Unspecified viral hepatitis C without hepatic coma: B19.20

## 2013-11-29 HISTORY — DX: Alcohol abuse, uncomplicated: F10.10

## 2013-11-29 HISTORY — DX: Unspecified convulsions: R56.9

## 2013-11-29 HISTORY — DX: Essential (primary) hypertension: I10

## 2013-11-29 HISTORY — DX: Sedative, hypnotic or anxiolytic dependence with withdrawal, unspecified: F13.239

## 2013-11-29 HISTORY — DX: Pain in right foot: M79.671

## 2013-11-29 HISTORY — DX: Epilepsy, unspecified, not intractable, without status epilepticus: G40.909

## 2013-11-29 LAB — URINE MICROSCOPIC-ADD ON

## 2013-11-29 LAB — URINALYSIS, ROUTINE W REFLEX MICROSCOPIC
Glucose, UA: NEGATIVE mg/dL
Hgb urine dipstick: NEGATIVE
Ketones, ur: >80 mg/dL — AB
NITRITE: NEGATIVE
PH: 5.5 (ref 5.0–8.0)
Protein, ur: 30 mg/dL — AB
SPECIFIC GRAVITY, URINE: 1.022 (ref 1.005–1.030)
Urobilinogen, UA: 2 mg/dL — ABNORMAL HIGH (ref 0.0–1.0)

## 2013-11-29 LAB — COMPREHENSIVE METABOLIC PANEL
ALT: 64 U/L — ABNORMAL HIGH (ref 0–53)
ANION GAP: 18 — AB (ref 5–15)
AST: 44 U/L — AB (ref 0–37)
Albumin: 4.1 g/dL (ref 3.5–5.2)
Alkaline Phosphatase: 61 U/L (ref 39–117)
BUN: 10 mg/dL (ref 6–23)
CALCIUM: 8.7 mg/dL (ref 8.4–10.5)
CO2: 23 meq/L (ref 19–32)
Chloride: 100 mEq/L (ref 96–112)
Creatinine, Ser: 0.87 mg/dL (ref 0.50–1.35)
Glucose, Bld: 107 mg/dL — ABNORMAL HIGH (ref 70–99)
Potassium: 4 mEq/L (ref 3.7–5.3)
Sodium: 141 mEq/L (ref 137–147)
Total Bilirubin: 1 mg/dL (ref 0.3–1.2)
Total Protein: 7.7 g/dL (ref 6.0–8.3)

## 2013-11-29 LAB — CBC WITH DIFFERENTIAL/PLATELET
BASOS ABS: 0 10*3/uL (ref 0.0–0.1)
Basophils Relative: 0 % (ref 0–1)
EOS PCT: 0 % (ref 0–5)
Eosinophils Absolute: 0 10*3/uL (ref 0.0–0.7)
HEMATOCRIT: 51 % (ref 39.0–52.0)
Hemoglobin: 17.5 g/dL — ABNORMAL HIGH (ref 13.0–17.0)
LYMPHS PCT: 10 % — AB (ref 12–46)
Lymphs Abs: 1.1 10*3/uL (ref 0.7–4.0)
MCH: 32.3 pg (ref 26.0–34.0)
MCHC: 34.3 g/dL (ref 30.0–36.0)
MCV: 94.1 fL (ref 78.0–100.0)
MONO ABS: 0.8 10*3/uL (ref 0.1–1.0)
Monocytes Relative: 7 % (ref 3–12)
Neutro Abs: 9 10*3/uL — ABNORMAL HIGH (ref 1.7–7.7)
Neutrophils Relative %: 83 % — ABNORMAL HIGH (ref 43–77)
Platelets: 121 10*3/uL — ABNORMAL LOW (ref 150–400)
RBC: 5.42 MIL/uL (ref 4.22–5.81)
RDW: 14.8 % (ref 11.5–15.5)
WBC: 10.9 10*3/uL — AB (ref 4.0–10.5)

## 2013-11-29 LAB — ETHANOL

## 2013-11-29 LAB — RAPID URINE DRUG SCREEN, HOSP PERFORMED
Amphetamines: NOT DETECTED
BENZODIAZEPINES: POSITIVE — AB
Barbiturates: NOT DETECTED
COCAINE: NOT DETECTED
Opiates: NOT DETECTED
Tetrahydrocannabinol: NOT DETECTED

## 2013-11-29 LAB — AMMONIA: Ammonia: 20 umol/L (ref 11–60)

## 2013-11-29 MED ORDER — CHLORDIAZEPOXIDE HCL 5 MG PO CAPS
25.0000 mg | ORAL_CAPSULE | Freq: Three times a day (TID) | ORAL | Status: AC
  Administered 2013-11-29 – 2013-11-30 (×2): 25 mg via ORAL
  Filled 2013-11-29 (×2): qty 5

## 2013-11-29 MED ORDER — LEVETIRACETAM IN NACL 500 MG/100ML IV SOLN
500.0000 mg | Freq: Once | INTRAVENOUS | Status: AC
  Administered 2013-11-29: 500 mg via INTRAVENOUS
  Filled 2013-11-29: qty 100

## 2013-11-29 MED ORDER — ONDANSETRON HCL 4 MG/2ML IJ SOLN: 4.0000 mg | Freq: Four times a day (QID) | INTRAMUSCULAR | Status: DC | PRN

## 2013-11-29 MED ORDER — LORAZEPAM 2 MG/ML IJ SOLN
1.0000 mg | Freq: Once | INTRAMUSCULAR | Status: AC
  Administered 2013-11-29: 1 mg via INTRAVENOUS
  Filled 2013-11-29: qty 1

## 2013-11-29 MED ORDER — SODIUM CHLORIDE 0.9 % IV SOLN
INTRAVENOUS | Status: AC
  Administered 2013-11-29: 20:00:00 via INTRAVENOUS

## 2013-11-29 MED ORDER — LEVETIRACETAM 500 MG PO TABS
500.0000 mg | ORAL_TABLET | Freq: Two times a day (BID) | ORAL | Status: DC
  Administered 2013-11-29 – 2013-11-30 (×2): 500 mg via ORAL
  Filled 2013-11-29 (×2): qty 1

## 2013-11-29 MED ORDER — LORAZEPAM 2 MG/ML IJ SOLN
INTRAMUSCULAR | Status: AC
  Administered 2013-11-29: 1 mg via INTRAVENOUS
  Filled 2013-11-29: qty 1

## 2013-11-29 MED ORDER — SODIUM CHLORIDE 0.9 % IV SOLN
INTRAVENOUS | Status: DC
  Administered 2013-11-29: 13:00:00 via INTRAVENOUS

## 2013-11-29 MED ORDER — OXYCODONE-ACETAMINOPHEN 5-325 MG PO TABS
1.0000 | ORAL_TABLET | Freq: Four times a day (QID) | ORAL | Status: DC | PRN
  Administered 2013-11-29 – 2013-11-30 (×2): 1 via ORAL
  Filled 2013-11-29 (×2): qty 1

## 2013-11-29 MED ORDER — LORAZEPAM 2 MG/ML IJ SOLN
1.0000 mg | Freq: Four times a day (QID) | INTRAMUSCULAR | Status: DC | PRN
Start: 2013-11-29 — End: 2013-11-30
  Administered 2013-11-29 – 2013-11-30 (×2): 1 mg via INTRAVENOUS
  Filled 2013-11-29 (×2): qty 1

## 2013-11-29 MED ORDER — ONDANSETRON HCL 4 MG PO TABS: 4.0000 mg | ORAL_TABLET | Freq: Four times a day (QID) | ORAL | Status: DC | PRN

## 2013-11-29 MED ORDER — CHLORDIAZEPOXIDE HCL 5 MG PO CAPS: 25.0000 mg | ORAL_CAPSULE | Freq: Every day | ORAL | Status: DC

## 2013-11-29 MED ORDER — LORAZEPAM 2 MG/ML IJ SOLN
INTRAMUSCULAR | Status: AC
  Administered 2013-11-29: 1 mg
  Filled 2013-11-29: qty 1

## 2013-11-29 MED ORDER — AMLODIPINE BESYLATE 2.5 MG PO TABS
5.0000 mg | ORAL_TABLET | Freq: Every day | ORAL | Status: DC
  Administered 2013-11-30: 5 mg via ORAL
  Filled 2013-11-29: qty 2

## 2013-11-29 MED ORDER — ENOXAPARIN SODIUM 40 MG/0.4ML ~~LOC~~ SOLN
40.0000 mg | SUBCUTANEOUS | Status: DC
  Administered 2013-11-29: 40 mg via SUBCUTANEOUS
  Filled 2013-11-29: qty 0.4

## 2013-11-29 NOTE — ED Notes (Signed)
Md Camelo (Neurology) at bedside

## 2013-11-29 NOTE — ED Notes (Signed)
Contacted pt placement about wait time.

## 2013-11-29 NOTE — ED Notes (Signed)
Pt noted to have twitching to his legs. Pt asked if he thought he was having a seizure and pt states "yes, this is it." Pt noted to have twitching progressively move up body with twitching to abdomen, then to bilateral arms and then to eye lips. Pt remains verbal, follows commands, PERRLA, no eye deviation noted. Airway intact.

## 2013-11-29 NOTE — H&P (Signed)
History and Physical  [name] JAS:[id number] DOB: Dec 21, 1950 DOA: 11/29/2013  Referring physician: Lurene Shadow, ER physician PCP: Lavera Guise, MD   Chief Complaint: Confusion  HPI: [name] is a 62 y.o. male  Past medical history of anxiety disorder on Xanax, seizure disorder, and hepatitis C who presented to the emergency room today after being brought in after acting confused. Apparently, the patient has a history of anxiety and exam next 2 mg 3 times a day but ran out several days ago. He was driving to his pharmacy for refill but then stated that he became lost and somewhat confused and pulled into a construction site. There he reportedly was unable to talk appropriately and was sent to the emergency room via EMS. In the emergency room, patient was noted to have trembling and lower extremities working its way up through the torso and to the arms. This required IV Ativan and Keppra. During these episodes, patient is able to interact, but it is often times not 100% accurate. ER workup was noteworthy for some bilirubin in his urine, and normal electrolytes, renal function, CBC, CT scan of head.  Patient also noted to have a urine drug screen positive for benzodiazepines. Neurology evaluated the patient and felt seizures with secondary benzo withdrawal and hospitalists were called for further evaluation.   Review of Systems:  Patient seen  after arrival to floor. Pt complains of  fatigue.  Pt denies any  headache, vision changes, dysphagia, chest pain, palpitations, shortness of breath, wheeze, cough, abdominal pain, hematuria, dysuria, constipation, diarrhea, focal extremity numbness or weakness or pain other than some chronic right foot pain..  Review of systems are otherwise negative  Past Medical History  Diagnosis Date  . Hypertension   . Chronic pain in right foot   . Seizures 2010  . Benzodiazepine dependence   . Benzodiazepine withdrawal   . Alcohol  abuse   . Hepatitis C    Past Surgical History  Procedure Laterality Date  . Lithotripsy    . Hemorrhoid surgery     Social History:  reports that he has been smoking Cigarettes.  He has been smoking about 1.00 pack per day. He does not have any smokeless tobacco history on file. He reports that he drinks alcohol. He reports that he does not use illicit drugs. Patient lives at  home by himself & is able to participate in activities of daily living with out assistance  Allergies  Allergen Reactions  . Opana [Oxymorphone Hcl]     Made him BLACKOUT  . Sulfur     Childhood reaction     Family history: Discussed with patient. Her blood pressure runs in his family.  Prior to Admission medications   Medication Sig Start Date End Date Taking? Authorizing Provider  ALPRAZolam Duanne Moron) 1 MG tablet Take 1 mg by mouth 3 (three) times daily as needed for anxiety.  06/16/09  Yes Historical Provider, MD  amLODipine (NORVASC) 5 MG tablet Take 5 mg by mouth daily. 06/16/09  Yes Historical Provider, MD  levETIRAcetam (KEPPRA) 500 MG tablet Take 500 mg by mouth 2 (two) times daily.   Yes Historical Provider, MD  Multiple Vitamin (MULTIVITAMIN WITH MINERALS) TABS tablet Take 1 tablet by mouth daily.   Yes Historical Provider, MD  oxyCODONE-acetaminophen (PERCOCET/ROXICET) 5-325 MG per tablet Take 1 tablet by mouth every 6 (six) hours as needed for moderate pain.  08/08/09  Yes Historical Provider, MD    Physical Exam: BP 146/75  Pulse 90  Temp(Src) 99.6 F (37.6 C) (Oral)  Resp 24  Ht 5\' 10"  (1.778 m)  Wt 97.523 kg (215 lb)  BMI 30.85 kg/m2  SpO2 99%  General:  Alert and oriented x3, no acute distress Eyes:  Sclera, mildly icteric, extraocular movements are intact ENT:  Normocephalic, atraumatic, mucous membranes are slightly dry Neck:  No JVD Cardiovascular:  Regular rate and rhythm, S1-S2 Respiratory:  Clear to auscultation bilaterally Abdomen:  Soft, nontender, nondistended, positive bowel  sounds Skin:  No skin breaks, tears or lesions Musculoskeletal:  No clubbing or cyanosis or edema Psychiatric:  Patient is appropriate, no evidence of psychoses Neurologic:  No focal deficits. Strength is overall slightly diminished 5/5, but symmetric. Downgoing toes          Labs on Admission:  Basic Metabolic Panel:  Recent Labs Lab 11/29/13 1505  NA 141  K 4.0  CL 100  CO2 23  GLUCOSE 107*  BUN 10  CREATININE 0.87  CALCIUM 8.7   Liver Function Tests:  Recent Labs Lab 11/29/13 1505  AST 44*  ALT 64*  ALKPHOS 61  BILITOT 1.0  PROT 7.7  ALBUMIN 4.1   No results found for this basename: LIPASE, AMYLASE,  in the last 168 hours No results found for this basename: AMMONIA,  in the last 168 hours CBC:  Recent Labs Lab 11/29/13 1235  WBC 10.9*  NEUTROABS 9.0*  HGB 17.5*  HCT 51.0  MCV 94.1  PLT 121*   Cardiac Enzymes: No results found for this basename: CKTOTAL, CKMB, CKMBINDEX, TROPONINI,  in the last 168 hours  BNP (last 3 results) No results found for this basename: PROBNP,  in the last 8760 hours CBG: No results found for this basename: GLUCAP,  in the last 168 hours  Radiological Exams on Admission: Dg Chest 2 View  11/29/2013   CLINICAL DATA:  Altered mental status.  History of seizures.  EXAM: CHEST  2 VIEW  COMPARISON:  None.  FINDINGS: Cardiopericardial silhouette within normal limits. Mediastinal contours normal. Trachea midline. No airspace disease or effusion.  Nodular densities are present over both lung bases. These potentially represent nipple shadows. Repeat frontal view of the chest recommended with nipple markers as the next step in assessment.  IMPRESSION: 1. No acute cardiopulmonary disease. 2. Bilateral lower lobe pulmonary nodules which may represent nipple shadows. Repeat frontal view of the chest is recommended with nipple markers as the next step in assessment.   Electronically Signed   By: Dereck Ligas M.D.   On: 11/29/2013 13:12    Ct Head Wo Contrast  11/29/2013   CLINICAL DATA:  Altered mental status.  Headache and slurred speech.  EXAM: CT HEAD WITHOUT CONTRAST  TECHNIQUE: Contiguous axial images were obtained from the base of the skull through the vertex without intravenous contrast.  COMPARISON:  None.  FINDINGS: There is mild cerebral atrophy. There is no evidence of acute cortical infarct, intracranial hemorrhage, mass, midline shift, or extra-axial fluid collection. Patchy hypodensities in the periventricular white matter are nonspecific but compatible with mild chronic small vessel ischemic disease.  Orbits are unremarkable. Mastoid air cells are clear. Mild mucosal thickening is noted in the frontal and maxillary sinuses.  IMPRESSION: No evidence of acute intracranial abnormality.   Electronically Signed   By: Logan Bores   On: 11/29/2013 13:32    EKG: Independently reviewed.  Sinus rhythm, left anterior fascicular block  Assessment/Plan Present on Admission:  . Seizures: Secondary to benzodiazepine withdrawal  .  Benzodiazepine withdrawal: Patient is normally on 2 mg 3 times a day, not 1 mg as stated in his med rec. I spoke to his pharmacy. She last filled his prescription on June 22. He states that he ran out of his prescription on Saturday. By my count, he still should have had 4 or 5 days of a 30 day supply. Suspect that he likely may be abusing his benzodiazepines slightly. I have put him on Librium taper plus when necessary Ativan for acute seizures. It is of note, the patient has a waiting Ativan prescription for him at his pharmacy for pickup  . Seizure disorder: Continue Keppra.  . Alcohol abuse: Patient states that he used to use alcohol at after going to rehabilitation, and now he only has a couple drinks a day and his last drink was several days ago. Monitor closely for alcohol withdrawal  . Hypertension: Continue Norvasc  . Abnormal chest x-ray: Questionable nodule seen on chest x-ray. Repeat in the  morning with 2 view  . Obesity: Patient. BMI greater than 30   history of hepatitis C: Followed by his PCP. Will check ammonia level to ensure that this is not playing underlying role initial confusion, the patient is appropriate now.  . Tobacco use disorder: Patient declined nicotine patch   Consultants:  Neurology  Code Status:  Full code  Family Communication:  Patient declined for me to call his family   Disposition Plan:  Home if no further episodes tomorrow  Time spent:  35 minutes  [facility] Pager [phone]  **Disclaimer: This note may have been dictated with voice recognition software. Similar sounding words can inadvertently be transcribed and this note may contain transcription errors which may not have been corrected upon publication of note.**

## 2013-11-29 NOTE — ED Notes (Signed)
Per EMS: Pt was driving when he started to experience a posterior HA and states "I just didn't feel well." Pt then tried to turn around, but pulled into a construction site. Construction workers state that when pt got out of car that they couldn't understand what the pt was saying and had slurred speech. Pt states "I just couldn't get my words out." On EMS arrival, pt AO x4. NIH 0. VSS. Hx: seizures, HTN

## 2013-11-29 NOTE — ED Notes (Signed)
Per neurology notes for pt, pt has hx of seizures dt benzodiazepine withdraw. Pt states he hasn't taken Xanax since Friday.

## 2013-11-29 NOTE — ED Notes (Signed)
During triage process, pt noted to have seizure like activity. When EMS was in room pt was AO x4, following all commands and was able to slide himself over to other bed. After appx 1 minute pt noted to become confused and slow to answer questions. Pt had twitching to bilateral arms and legs with occasional jerking. PT still verbal, but is slow to answer questions. Md Thurnell Garbe made aware and at bedside assessing. HR noted to increase to 110 ST.

## 2013-11-29 NOTE — Consult Note (Signed)
Reason for Consult:seizures Referring Physician: Dr Thurnell Garbe CC: seizures  HPI: [name] is a 63 y.o. 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 The patient takes Xanax, usually twice daily for his anxiety disorder. He ran out of this medication several days ago and was driving to his pharmacy for a refill. He became lost and somewhat confused and ended up in [place]. He pulled into a construction site to turn around and when he attempted to speak to the construction worker's he felt as if he knew what he wanted to say but was unable to get the words out. The workers felt that there was something wrong and called EMS. The patient was brought to the emergency department.   The nursing staff noted that he appeared to be having seizure activity which started as a trembling in both lower extremities and worked its way up through his body and into his arms. He received 1 mg of IV Ativan on 2 separate occasions in the emergency department as well as Keppra 500 mg IV x1. When I saw the patient he was having difficulty answering questions. He would stop in midsentence occasionally pulling his sheet above his head and randomly moving his arms and legs. During these episodes the patient stated that he felt as if he knew what was going on around him but could not respond. Later these activities stopped and I was able to get a history.   He receives most of his medical care in [place]. He states his seizure started 3-4 years ago. His last seizure prior to today occurred last February when he "fell out" on the way to his physician's office. He denies ever missing any doses of Keppra.  A CT of his head today showed no acute findings. A chest x-ray showed possible bilateral lower lobe pulmonary nodules with followup  recommended.  Past Medical History  Diagnosis Date  . Hypertension   . Chronic pain in right foot   . Seizures 2010  . Benzodiazepine dependence   . Benzodiazepine withdrawal   . Alcohol abuse   . Hepatitis C     Past Surgical History  Procedure Laterality Date  . Lithotripsy    . Hemorrhoid surgery     Family History: The patient's mother is alive and well. His father has hypertension and had a "mini stroke" last year.  Social History:  reports that he has been smoking Cigarettes.  He has been smoking about 1.00 pack per day. He does not have any smokeless tobacco history on file. He reports that he drinks alcohol, usually several beers per day. He reports that he does not use illicit drugs.  Allergies  Allergen Reactions  . Opana [Oxymorphone Hcl]   . Sulfur     Medications:  Scheduled: . levETIRAcetam  500 mg Intravenous Once   Current Facility-Administered Medications  Medication Dose Route Frequency Provider Last Rate Last Dose  . 0.9 %  sodium chloride infusion   Intravenous Continuous Alfonzo Feller, DO 75 mL/hr at 11/29/13 1242    . levETIRAcetam (KEPPRA) IVPB 500 mg/100 mL premix  500 mg Intravenous Once Alfonzo Feller, DO   500 mg at 11/29/13 1432   Current Outpatient Prescriptions  Medication Sig Dispense Refill  . ALPRAZolam (XANAX) 1 MG tablet Take 1 mg by mouth.      Marland Kitchen  amLODipine (NORVASC) 5 MG tablet Take 5 mg by mouth daily.      Marland Kitchen oxyCODONE-acetaminophen (PERCOCET/ROXICET) 5-325 MG per tablet Take 1 tablet by mouth.         ROS: History obtained from the patient  General ROS: negative for - chills, fatigue, fever, night sweats, weight gain or weight loss Psychological ROS: negative for - behavioral disorder, hallucinations, memory difficulties, mood swings or suicidal ideation Ophthalmic ROS: negative for - blurry vision, double vision, eye pain or loss of vision ENT ROS: negative for - epistaxis, nasal discharge, oral lesions, sore throat,  tinnitus or vertigo Allergy and Immunology ROS: negative for - hives or itchy/watery eyes Hematological and Lymphatic ROS: negative for - bleeding problems, bruising or swollen lymph nodes Endocrine ROS: negative for - galactorrhea, hair pattern changes, polydipsia/polyuria or temperature intolerance Respiratory ROS: negative for - cough, hemoptysis, shortness of breath or wheezing Cardiovascular ROS: negative for - chest pain, dyspnea on exertion, edema or irregular heartbeat Gastrointestinal ROS: negative for - abdominal pain, diarrhea, hematemesis, nausea/vomiting or stool incontinence Genito-Urinary ROS: negative for - dysuria, hematuria, incontinence or urinary frequency/urgency Musculoskeletal ROS: Positive for flulike symptoms and achiness yesterday. Neurological ROS: as noted in HPI Dermatological ROS: negative for rash and skin lesion changes   Physical Examination: Blood pressure 135/81, pulse 91, temperature 98.3 F (36.8 C), temperature source Oral, resp. rate 23, height 5\' 10"  (1.778 m), weight 215 lb (97.523 kg), SpO2 94.00%.  Neurologic Examination Mental Status: Alert, oriented, thought content appropriate.  Speech fluent without evidence of aphasia.  Able to follow 3 step commands without difficulty. Cranial Nerves: II: Discs not visualized; Visual fields grossly normal, pupils equal, round, reactive to light and accommodation III,IV, VI: ptosis not present, extra-ocular motions intact bilaterally V,VII: smile symmetric, facial light touch sensation normal bilaterally VIII: hearing normal bilaterally IX,X: gag reflex present XI: bilateral shoulder shrug XII: midline tongue extension Motor: Right : Upper extremity   5/5    Left:     Upper extremity   5/5  Lower extremity   5/5     Lower extremity   5/5 Tone and bulk:normal tone throughout; no atrophy noted Sensory: Pinprick and light touch intact throughout, bilaterally Deep Tendon Reflexes: 2+ and symmetric  throughout Plantars: Right: downgoing   Left: downgoing Cerebellar: normal finger-to-nose, normal rapid alternating movements and normal heel-to-shin test Gait: Deferred at this time for safety reasons   Laboratory Studies:   Basic Metabolic Panel: No results found for this basename: NA, K, CL, CO2, GLUCOSE, BUN, CREATININE, CALCIUM, MG, PHOS,  in the last 168 hours  Liver Function Tests: No results found for this basename: AST, ALT, ALKPHOS, BILITOT, PROT, ALBUMIN,  in the last 168 hours No results found for this basename: LIPASE, AMYLASE,  in the last 168 hours No results found for this basename: AMMONIA,  in the last 168 hours  CBC:  Recent Labs Lab 11/29/13 1235  WBC 10.9*  NEUTROABS 9.0*  HGB 17.5*  HCT 51.0  MCV 94.1  PLT 121*    Cardiac Enzymes: No results found for this basename: CKTOTAL, CKMB, CKMBINDEX, TROPONINI,  in the last 168 hours  BNP: No components found with this basename: POCBNP,   CBG: No results found for this basename: GLUCAP,  in the last 168 hours  Microbiology: No results found for this or any previous visit.  Coagulation Studies: No results found for this basename: LABPROT, INR,  in the last 72 hours  Urinalysis: No results found for this basename:  COLORURINE, APPERANCEUR, LABSPEC, [place], GLUCOSEU, HGBUR, BILIRUBINUR, KETONESUR, PROTEINUR, UROBILINOGEN, NITRITE, LEUKOCYTESUR,  in the last 168 hours  Lipid Panel:  No results found for this basename: chol, trig, hdl, cholhdl, vldl, ldlcalc    HgbA1C:  No results found for this basename: HGBA1C    Urine Drug Screen:   No results found for this basename: labopia, cocainscrnur, labbenz, amphetmu, thcu, labbarb    Alcohol Level: No results found for this basename: ETH,  in the last 168 hours  Other results: EKG: SR rate 88 BPM. Please see formal reading for complete details.  Imaging:  Dg Chest 2 View 11/29/2013    1. No acute cardiopulmonary disease.  2. Bilateral lower lobe  pulmonary nodules which may represent nipple shadows. Repeat frontal view of the chest is recommended with nipple markers as the next step in assessment.      Ct Head Wo Contrast 11/29/2013    No evidence of acute intracranial abnormality.     Assessment/Plan:  63 year old male history of a seizure disorder, anxiety disorder, and alcohol use who recently ran out of his Xanax and was on his way to his pharmacy in [place] to have the medication refilled when he became confused and disoriented. He ended up driving to [facility] and developed speech difficulties while trying to communicate with some construction worker's. EMS was summoned and the patient was brought to the emergency department at [facility]. In the emergency department the patient appear to be having seizure-like activity. He was given IV Ativan IV Keppra and the symptoms improved.  Further assessment and plan to follow per Dr. Armida Sans.   Mikey Bussing PA-C [facility] Pager [phone] 11/29/2013, 3:51 PM   Addendum: Patient seen and evaluated with physician assistant. Mr. Broady had had prior seizures in the setting of xanax withdrawal and his seizures today are strongly suggestive of recurrent provoked xanax withdraw seizures. He has been on keppra as per his outside neurologist and I will not make any changes in that regard as I have not all details regarding prior EEG's and seizure work up. Instead, will suggest management of his benzo withdraw syndrome as per medicine team. His mental status is back to baseline now and thus no further neurological intervention needed at this moment.  Donzetta Kohut Camilo,MD

## 2013-11-29 NOTE — ED Notes (Signed)
Admitting MD at bedside.

## 2013-11-29 NOTE — Progress Notes (Addendum)
Pt called out to nurse stating "its starting again." Shaking and twitching noted in bilateral arms and legs. Pt had episodes of expressive aphasia. During this time patient able to follow commands, and talk to the nurse.  PRN medication administered. This episode lasted approximately 5 minutes, patient able to form words and sentences at this time with minimal jerking noted, neurologically remains intact. On call notified of episode.  Will continue to monitor. Verdie Drown RN BSN

## 2013-11-29 NOTE — ED Provider Notes (Signed)
CSN: [id number]     Arrival date & time 11/29/13  1201 History   First MD Initiated Contact with Patient 11/29/13 1210     Chief Complaint  Patient presents with  . Seizures      HPI Pt was seen at 1215. Per EMS and pt report, c/o sudden onset and resolution of one episode of "not feeling well" that began PTA. Pt states he was driving and "just didn't feel well." Pt states he pulled the car off to the side of the road where witnesses state they "couldn't understand what he was saying" and "wasn't walking right." Pt states "I think I had a seizure." EMS states pt was A&O on their arrival to scene, neuro exam was intact/NIH 0. Pt was not incontinent of bowel/bladder, no fall, no LOC/syncope, no AMS. Pt recalls events PTA. Pt states he has hx seizures, rx keppra. Endorses his LD keppra was this morning, LD xanax "Friday or Saturday." Denies CP/SOB, no abd pain, no N/V/D, no focal motor weakness, no tingling/numbness in extremities, no fevers.    Past Medical History  Diagnosis Date  . Hypertension   . Chronic pain in right foot   . Seizures 2010  . Benzodiazepine dependence   . Benzodiazepine withdrawal   . Alcohol abuse   . Hepatitis C    Past Surgical History  Procedure Laterality Date  . Lithotripsy    . Hemorrhoid surgery      History  Substance Use Topics  . Smoking status: Current Every Day Smoker -- 1.00 packs/day    Types: Cigarettes  . Smokeless tobacco: Not on file  . Alcohol Use: Yes    Review of Systems ROS: Statement: All systems negative except as marked or noted in the HPI; Constitutional: Negative for fever and chills. ; ; Eyes: Negative for eye pain, redness and discharge. ; ; ENMT: Negative for ear pain, hoarseness, nasal congestion, sinus pressure and sore throat. ; ; Cardiovascular: Negative for chest pain, palpitations, diaphoresis, dyspnea and peripheral edema. ; ; Respiratory: Negative for cough, wheezing and stridor. ; ; Gastrointestinal: Negative for  nausea, vomiting, diarrhea, abdominal pain, blood in stool, hematemesis, jaundice and rectal bleeding. . ; ; Genitourinary: Negative for dysuria, flank pain and hematuria. ; ; Musculoskeletal: Negative for back pain and neck pain. Negative for swelling and trauma.; ; Skin: Negative for pruritus, rash, abrasions, blisters, bruising and skin lesion.; ; Neuro: +seizure. Negative for headache, lightheadedness and neck stiffness. Negative for weakness, altered level of consciousness , extremity weakness, paresthesias, syncope.      Allergies  Opana and Sulfur  Home Medications   Prior to Admission medications   Medication Sig Start Date End Date Taking? Authorizing Provider  ALPRAZolam Duanne Moron) 1 MG tablet Take 1 mg by mouth 3 (three) times daily as needed for anxiety.  06/16/09  Yes Historical Provider, MD  amLODipine (NORVASC) 5 MG tablet Take 5 mg by mouth daily. 06/16/09  Yes Historical Provider, MD  levETIRAcetam (KEPPRA) 500 MG tablet Take 500 mg by mouth 2 (two) times daily.   Yes Historical Provider, MD  Multiple Vitamin (MULTIVITAMIN WITH MINERALS) TABS tablet Take 1 tablet by mouth daily.   Yes Historical Provider, MD  oxyCODONE-acetaminophen (PERCOCET/ROXICET) 5-325 MG per tablet Take 1 tablet by mouth every 6 (six) hours as needed for moderate pain.  08/08/09  Yes Historical Provider, MD   BP 119/77  Pulse 97  Temp(Src) 98.3 F (36.8 C) (Oral)  Resp 17  Ht 5\' 10"  (1.778 m)  Wt 215 lb (97.523 kg)  BMI 30.85 kg/m2  SpO2 98% Physical Exam 1220: Physical examination:  Nursing notes reviewed; Vital signs and O2 SAT reviewed;  Constitutional: Well developed, Well nourished, Well hydrated, In no acute distress; Head:  Normocephalic, atraumatic; Eyes: EOMI, PERRL, No scleral icterus; ENMT: Mouth and pharynx normal, Mucous membranes moist; Neck: Supple, Full range of motion, No lymphadenopathy; Cardiovascular: Regular rate and rhythm, No gallop; Respiratory: Breath sounds clear & equal  bilaterally, No wheezes.  Speaking full sentences with ease, Normal respiratory effort/excursion; Chest: Nontender, Movement normal; Abdomen: Soft, Nontender, Nondistended, Normal bowel sounds; Genitourinary: No CVA tenderness; Extremities: Pulses normal, No tenderness, No edema, No calf edema or asymmetry.; Neuro: AA&Ox3, Major CN grossly intact. No facial droop. Speech clear. No gross focal motor or sensory deficits in extremities.; Skin: Color normal, Warm, Dry.   ED Course  Procedures     EKG Interpretation   Date/Time:  Monday November 29 2013 12:12:08 EDT Ventricular Rate:  88 PR Interval:  131 QRS Duration: 89 QT Interval:  337 QTC Calculation: 408 R Axis:   -63 Text Interpretation:  Sinus rhythm Left axis deviation Left anterior  fascicular block Abnormal R-wave progression, early transition No old  tracing to compare Confirmed by [facility]  MD, Nunzio Cory [phone]) on  11/29/2013 12:27:30 PM      MDM  MDM Reviewed: previous chart, nursing note and vitals Reviewed previous: labs Interpretation: labs, x-ray and CT scan   Results for orders placed during the hospital encounter of 11/29/13  URINALYSIS, ROUTINE W REFLEX MICROSCOPIC      Result Value Ref Range   Color, Urine AMBER (*) YELLOW   APPearance CLEAR  CLEAR   Specific Gravity, Urine 1.022  1.005 - 1.030   pH 5.5  5.0 - 8.0   Glucose, UA NEGATIVE  NEGATIVE mg/dL   Hgb urine dipstick NEGATIVE  NEGATIVE   Bilirubin Urine MODERATE (*) NEGATIVE   Ketones, ur >80 (*) NEGATIVE mg/dL   Protein, ur 30 (*) NEGATIVE mg/dL   Urobilinogen, UA 2.0 (*) 0.0 - 1.0 mg/dL   Nitrite NEGATIVE  NEGATIVE   Leukocytes, UA SMALL (*) NEGATIVE  URINE RAPID DRUG SCREEN (HOSP PERFORMED)      Result Value Ref Range   Opiates NONE DETECTED  NONE DETECTED   Cocaine NONE DETECTED  NONE DETECTED   Benzodiazepines POSITIVE (*) NONE DETECTED   Amphetamines NONE DETECTED  NONE DETECTED   Tetrahydrocannabinol NONE DETECTED  NONE DETECTED    Barbiturates NONE DETECTED  NONE DETECTED  CBC WITH DIFFERENTIAL      Result Value Ref Range   WBC 10.9 (*) 4.0 - 10.5 K/uL   RBC 5.42  4.22 - 5.81 MIL/uL   Hemoglobin 17.5 (*) 13.0 - 17.0 g/dL   HCT 51.0  39.0 - 52.0 %   MCV 94.1  78.0 - 100.0 fL   MCH 32.3  26.0 - 34.0 pg   MCHC 34.3  30.0 - 36.0 g/dL   RDW 14.8  11.5 - 15.5 %   Platelets 121 (*) 150 - 400 K/uL   Neutrophils Relative % 83 (*) 43 - 77 %   Neutro Abs 9.0 (*) 1.7 - 7.7 K/uL   Lymphocytes Relative 10 (*) 12 - 46 %   Lymphs Abs 1.1  0.7 - 4.0 K/uL   Monocytes Relative 7  3 - 12 %   Monocytes Absolute 0.8  0.1 - 1.0 K/uL   Eosinophils Relative 0  0 - 5 %   Eosinophils  Absolute 0.0  0.0 - 0.7 K/uL   Basophils Relative 0  0 - 1 %   Basophils Absolute 0.0  0.0 - 0.1 K/uL  COMPREHENSIVE METABOLIC PANEL      Result Value Ref Range   Sodium 141  137 - 147 mEq/L   Potassium 4.0  3.7 - 5.3 mEq/L   Chloride 100  96 - 112 mEq/L   CO2 23  19 - 32 mEq/L   Glucose, Bld 107 (*) 70 - 99 mg/dL   BUN 10  6 - 23 mg/dL   Creatinine, Ser 0.87  0.50 - 1.35 mg/dL   Calcium 8.7  8.4 - 10.5 mg/dL   Total Protein 7.7  6.0 - 8.3 g/dL   Albumin 4.1  3.5 - 5.2 g/dL   AST 44 (*) 0 - 37 U/L   ALT 64 (*) 0 - 53 U/L   Alkaline Phosphatase 61  39 - 117 U/L   Total Bilirubin 1.0  0.3 - 1.2 mg/dL   GFR calc non Af Amer >90  >90 mL/min   GFR calc Af Amer >90  >90 mL/min   Anion gap 18 (*) 5 - 15  ETHANOL      Result Value Ref Range   Alcohol, Ethyl (B) <11  0 - 11 mg/dL  URINE MICROSCOPIC-ADD ON      Result Value Ref Range   Squamous Epithelial / LPF RARE  RARE   WBC, UA 3-6  <3 WBC/hpf   Bacteria, UA FEW (*) RARE   Casts HYALINE CASTS (*) NEGATIVE   Urine-Other MUCOUS PRESENT     Dg Chest 2 View 11/29/2013   CLINICAL DATA:  Altered mental status.  History of seizures.  EXAM: CHEST  2 VIEW  COMPARISON:  None.  FINDINGS: Cardiopericardial silhouette within normal limits. Mediastinal contours normal. Trachea midline. No airspace disease or  effusion.  Nodular densities are present over both lung bases. These potentially represent nipple shadows. Repeat frontal view of the chest recommended with nipple markers as the next step in assessment.  IMPRESSION: 1. No acute cardiopulmonary disease. 2. Bilateral lower lobe pulmonary nodules which may represent nipple shadows. Repeat frontal view of the chest is recommended with nipple markers as the next step in assessment.   Electronically Signed   By: Dereck Ligas M.D.   On: 11/29/2013 13:12   Ct Head Wo Contrast 11/29/2013   CLINICAL DATA:  Altered mental status.  Headache and slurred speech.  EXAM: CT HEAD WITHOUT CONTRAST  TECHNIQUE: Contiguous axial images were obtained from the base of the skull through the vertex without intravenous contrast.  COMPARISON:  None.  FINDINGS: There is mild cerebral atrophy. There is no evidence of acute cortical infarct, intracranial hemorrhage, mass, midline shift, or extra-axial fluid collection. Patchy hypodensities in the periventricular white matter are nonspecific but compatible with mild chronic small vessel ischemic disease.  Orbits are unremarkable. Mastoid air cells are clear. Mild mucosal thickening is noted in the frontal and maxillary sinuses.  IMPRESSION: No evidence of acute intracranial abnormality.   Electronically Signed   By: Logan Bores   On: 11/29/2013 13:32    1425:  Pt has had 2 seizures while in the ED: shaking begins with bilat LE's, progresses up body to bilat arms, then head/face. Pt did not have LOC or AMS. Both episodes resolved after IV ativan. Pt remained A&O, speaking with ED staff, throughout the seizures and after the seizures. No post-ictal state. IV keppra dose ordered.  [facility]  Clinic records received: pt has hx of seizures, have been previously attributed to benzodiazepine withdrawal, or possible etoh withdrawal.  T/C to Neuro Dr. Armida Sans, case discussed, including:  HPI, pertinent PM/SHx, VS/PE, dx testing, ED course  and treatment:  Agreeable with ED treatment, will consult, requests to admit to medicine service.   1600:  No further seizures since last dose of IV ativan and dose of IV keppra. Dx and testing d/w pt.  Questions answered.  Verb understanding, agreeable to admit. T/C to [facility]. [place] Pink, case discussed, including:  HPI, pertinent PM/SHx, VS/PE, dx testing, ED course and treatment:  Agreeable to admit, requests to write temporary orders, obtain observation tele bed to team 10.    Alfonzo Feller, DO 12/02/13 1627

## 2013-11-29 NOTE — ED Notes (Signed)
PT is now AO x4. Aware of seizure. No injury or incontinence noted. Pt states "I feel like I had a seizure."

## 2013-11-30 DIAGNOSIS — E669 Obesity, unspecified: Secondary | ICD-10-CM

## 2013-11-30 DIAGNOSIS — B1920 Unspecified viral hepatitis C without hepatic coma: Secondary | ICD-10-CM

## 2013-11-30 DIAGNOSIS — F1010 Alcohol abuse, uncomplicated: Secondary | ICD-10-CM

## 2013-11-30 DIAGNOSIS — I10 Essential (primary) hypertension: Secondary | ICD-10-CM

## 2013-11-30 DIAGNOSIS — G9349 Other encephalopathy: Secondary | ICD-10-CM | POA: Diagnosis not present

## 2013-11-30 DIAGNOSIS — F17200 Nicotine dependence, unspecified, uncomplicated: Secondary | ICD-10-CM

## 2013-11-30 LAB — CBC
HCT: 46.9 % (ref 39.0–52.0)
Hemoglobin: 15.6 g/dL (ref 13.0–17.0)
MCH: 31.7 pg (ref 26.0–34.0)
MCHC: 33.3 g/dL (ref 30.0–36.0)
MCV: 95.3 fL (ref 78.0–100.0)
Platelets: 109 10*3/uL — ABNORMAL LOW (ref 150–400)
RBC: 4.92 MIL/uL (ref 4.22–5.81)
RDW: 14.6 % (ref 11.5–15.5)
WBC: 8.2 10*3/uL (ref 4.0–10.5)

## 2013-11-30 LAB — BASIC METABOLIC PANEL
ANION GAP: 11 (ref 5–15)
BUN: 10 mg/dL (ref 6–23)
CALCIUM: 8.1 mg/dL — AB (ref 8.4–10.5)
CO2: 26 meq/L (ref 19–32)
CREATININE: 0.82 mg/dL (ref 0.50–1.35)
Chloride: 102 mEq/L (ref 96–112)
GFR calc Af Amer: >90 mL/min (ref >90)
GFR calc non Af Amer: >90 mL/min (ref >90)
Glucose, Bld: 104 mg/dL — ABNORMAL HIGH (ref 70–99)
Potassium: 3.4 mEq/L — ABNORMAL LOW (ref 3.7–5.3)
Sodium: 139 mEq/L (ref 137–147)

## 2013-11-30 LAB — URINE CULTURE: Colony Count: 10000

## 2013-11-30 MED ORDER — ALPRAZOLAM 0.5 MG PO TABS: 1.0000 mg | ORAL_TABLET | Freq: Three times a day (TID) | ORAL | Status: DC | PRN

## 2013-11-30 MED ORDER — CHLORDIAZEPOXIDE HCL 10 MG PO CAPS: ORAL_CAPSULE | ORAL | Status: DC

## 2013-11-30 MED ORDER — POTASSIUM CHLORIDE CRYS ER 20 MEQ PO TBCR
40.0000 meq | EXTENDED_RELEASE_TABLET | Freq: Once | ORAL | Status: AC
  Administered 2013-11-30: 40 meq via ORAL
  Filled 2013-11-30: qty 2

## 2013-11-30 NOTE — Progress Notes (Signed)
Pt had episode of confusion, and slurred speech lasting two minutes. No jerking or spastic movement noted. Pt was unable to answer his age, and then when reasked patient able to state age, birthdate, location, and month. Pt states that he remembers getting confused and the questions that he was asked. Vitals remain stable. Will continue to monitor. Verdie Drown RN BSN

## 2013-11-30 NOTE — Discharge Instructions (Signed)
Benzodiazepine Withdrawal  °Benzodiazepines are a group of drugs that are prescribed for both short-term and long-term treatment of a variety of medical conditions. For some of these conditions, such as seizures and sudden and severe muscle spasms, they are used only for a few hours or a few days. For other conditions, such as anxiety, sleep problems, or frequent muscle spasms or to help prevent seizures, they are used for an extended period, usually weeks or months. °Benzodiazepines work by changing the way your brain functions. Normally, chemicals in your brain called neurotransmitters send messages between your brain cells. The neurotransmitter that benzodiazepines affect is called gamma-aminobutyric acid (GABA). GABA sends out messages that have a calming effect on many of the functions of your brain. Benzodiazepines make these messages stronger and increase this calming effect. °Short-term use of benzodiazepines usually does not cause problems when you stop taking the drugs. However, if you take benzodiazepines for a long time, your body can adjust to the drug and require more of it to produce the same effect (drug tolerance). Eventually, you can develop physical dependence on benzodiazepines, which is when you experience negative effects if your dosage of benzodiazepines is reduced or stopped too quickly. These negative effects are called symptoms of withdrawal. °SYMPTOMS °Symptoms of withdrawal may begin anytime within the first 10 days after you stop taking the benzodiazepine. They can last from several weeks up to a few months but usually are the worst between the first 10 to 14 days.  °The actual symptoms also vary, depending on the type of benzodiazepine you take. Possible symptoms include: °· Anxiety. °· Excitability. °· Irritability. °· Depression. °· Mood swings. °· Trouble sleeping. °· Confusion. °· Uncontrollable shaking (tremors). °· Muscle weakness. °· Seizures. °DIAGNOSIS °To diagnose  benzodiazepine withdrawal, your caregiver will examine you for certain signs, such as: °· Rapid heartbeat. °· Rapid breathing. °· Tremors. °· High blood pressure. °· Fever. °· Mood changes. °Your caregiver also may ask the following questions about your use of benzodiazepines: °· What type of benzodiazepine did you take? °· How much did you take each day? °· How long did you take the drug? °· When was the last time you took the drug? °· Do you take any other drugs? °· Have you had alcohol recently? °· Have you had a seizure recently? °· Have you lost consciousness recently? °· Have you had trouble remembering recent events? °· Have you had a recent increase in anxiety, irritability, or trouble sleeping? °A drug test also may be administered. °TREATMENT °The treatment for benzodiazepine withdrawal can vary, depending on the type and severity of your symptoms, what type of benzodiazepine you have been taking, and how long you have been taking the benzodiazepine. Sometimes it is necessary for you to be treated in a hospital, especially if you are at risk of seizures.  °Often, treatment includes a prescription for a long-acting benzodiazepine, the dosage of which is reduced slowly over a long period. This period could be several weeks or months. Eventually, your dosage will be reduced to a point that you can stop taking the drug, without experiencing withdrawal symptoms. This is called tapered withdrawal. Occasionally, minor symptoms of withdrawal continue for a few days or weeks after you have completed a tapered withdrawal. °SEEK IMMEDIATE MEDICAL CARE IF: °· You have a seizure. °· You develop a craving for drugs or alcohol. °· You begin to experience symptoms of withdrawal during your tapered withdrawal. °· You become very confused. °· You lose consciousness. °· You   have trouble breathing. °· You think about hurting yourself or someone else. °Document Released: 04/18/2011 Document Revised: 07/22/2011 Document  Reviewed: 04/18/2011 °[employer]® Patient Information ©2015 [employer]. This information is not intended to replace advice given to you by your health care provider. Make sure you discuss any questions you have with your health care provider. ° °Seizure, Adult °A seizure is abnormal electrical activity in the brain. Seizures usually last from 30 seconds to 2 minutes. There are various types of seizures. °Before a seizure, you may have a warning sensation (aura) that a seizure is about to occur. An aura may include the following symptoms:  °· Fear or anxiety. °· Nausea. °· Feeling like the room is spinning (vertigo). °· Vision changes, such as seeing flashing lights or spots. °Common symptoms during a seizure include: °· A change in attention or behavior (altered mental status). °· Convulsions with rhythmic jerking movements. °· Drooling. °· Rapid eye movements. °· Grunting. °· Loss of bladder and bowel control. °· Bitter taste in the mouth. °· Tongue biting. °After a seizure, you may feel confused and sleepy. You may also have an injury resulting from convulsions during the seizure. °HOME CARE INSTRUCTIONS  °· If you are given medicines, take them exactly as prescribed by your health care provider. °· Keep all follow-up appointments as directed by your health care provider. °· Do not swim or drive or engage in risky activity during which a seizure could cause further injury to you or others until your health care provider says it is OK. °· Get adequate rest. °· Teach friends and family what to do if you have a seizure. They should: °¨ Lay you on the ground to prevent a fall. °¨ Put a cushion under your head. °¨ Loosen any tight clothing around your neck. °¨ Turn you on your side. If vomiting occurs, this helps keep your airway clear. °¨ Stay with you until you recover. °¨ Know whether or not you need emergency care. °SEEK IMMEDIATE MEDICAL CARE IF: °· The seizure lasts longer than 5 minutes. °· The seizure is  severe or you do not wake up immediately after the seizure. °· You have an altered mental status after the seizure. °· You are having more frequent or worsening seizures. °Someone should drive you to the emergency department or call local emergency services (911 in [place] °MAKE SURE YOU: °· Understand these instructions. °· Will watch your condition. °· Will get help right away if you are not doing well or get worse. °Document Released: 04/26/2000 Document Revised: 02/17/2013 Document Reviewed: 12/09/2012 °[employer]® Patient Information ©2015 [employer]. This information is not intended to replace advice given to you by your health care provider. Make sure you discuss any questions you have with your health care provider. ° °

## 2013-11-30 NOTE — Discharge Summary (Signed)
Physician Discharge Summary  [name]:[id number] DOB: May 15, 1950 DOA: 11/29/2013  PCP: Lavera Guise, MD  Admit date: 11/29/2013 Discharge date: 11/30/2013  Time spent: 30 minutes  Recommendations for Outpatient Follow-up:  Patient will be discharged to home.  He is to continue taking his medications AS PRESCRIBED.  He will need to follow up with his primary care physician within one week of discharge.  He will need to follow up with his neurologist as well.  He is to continue a heart healthy diet. He should also continue physical activity as tolerated.  Spoke with his mother, via phone, patient will likely stay with her for a couple of days.   Discharge Diagnoses:  Acute encephalopathy with seizure disorder likely secondary to benzodiazepine withdrawal Essential hypertension Hepatitis C Abnormal chest x-ray Obesity, BMI greater than 30 Alcohol abuse Tobacco abuse  Discharge Condition: Stable  Diet recommendation: Heart healthy  Filed Weights   11/29/13 1210 11/29/13 1949  Weight: 97.523 kg (215 lb) 90.719 kg (200 lb)    History of present illness:  On 11/29/2013 [name] is a 63 y.o. male with a past medical history of anxiety disorder on Xanax, seizure disorder, and hepatitis C who presented to the emergency room with confusion. Apparently, the patient has a history of anxiety and takes Xanax 2 mg 3 times a day but ran out several days ago. He was driving to his pharmacy for refill, but then stated that he became lost and somewhat confused and pulled into a construction site. There he reportedly was unable to talk appropriately and was sent to the emergency room via EMS. In the emergency room, patient was noted to have trembling and lower extremities working its way up through the torso and to the arms. This required IV Ativan and Keppra. During these episodes, patient was able to interact, but not 100% accurate. ER workup was noteworthy for some bilirubin  in his urine, and normal electrolytes, renal function, CBC, CT scan of head. Patient also noted to have a urine drug screen positive for benzodiazepines. Neurology evaluated the patient and felt seizures with secondary benzo withdrawal and hospitalists were called for further evaluation.  Hospital Course:  Acute encephalopathy with seizure disorder likely secondary to benzodiazepine withdrawal -Resolved -It seems the patient was taking Xanax and recommended, and was found to have withdrawal seizures from benzodiazepines. -Neurology was consulted and recommended Librium taper -Patient is on [place] for seizure disorder, which should be continued -He will need to follow up with his primary care physician as well as his neurologist within one week of discharge. -Spoke with patient's mother via phone, patient will need to stay with her for approximately 2-3 days.  Essential Hypertension -Stable, continue Norvasc  Abnormal chest x-ray -Chest x-ray on 720 showed questionable nodule -Repeat CXR 7/21: Previously questioned lower lung nodules do not persist and are felt  due to summation artifact on the comparison  Obesity, BMI greater than 30 -Patient will need to discuss with his primary care physician diet modification as well as exercise program.  Alcohol abuse -Patient supposedly gone to rehabilitation, and now only drinks a couple drinks per day -Patient did not have any withdrawal symptoms during his hospital course  History of hepatitis C -Patient to continue following up with his primary care physician -Ammonia level 20  Tobacco abuse -Patient states he stopped smoking on Sunday -He refused a nicotine patch -Patient was strongly urged to continue smoking cessation  Procedures: None  Consultations: Neurology  Discharge Exam: Filed Vitals:   11/30/13 0951  BP: 110/78  Pulse: 89  Temp: 98.9 F (37.2 C)  Resp: 18     General: Well developed, well nourished, NAD, appears  stated age  HEENT: NCAT, PERRLA, EOMI,  Mildly Icteic Sclera, mucous membranes moist.  Neck: Supple, no JVD, no masses  Cardiovascular: S1 S2 auscultated, no rubs, murmurs or gallops. Regular rate and rhythm.  Respiratory: Clear to auscultation bilaterally with equal chest rise  Abdomen: Soft, nontender, nondistended, + bowel sounds  Extremities: warm dry without cyanosis clubbing or edema  Neuro: AAOx3, cranial nerves grossly intact. Strength 5/5 in patient's upper and lower extremities bilaterally  Skin: Without rashes exudates or nodules  Psych: Normal affect and demeanor  Discharge Instructions      Discharge Instructions   Discharge instructions    Complete by:  As directed   Patient will be discharged to home.  He is to continue taking his medications AS PRESCRIBED.  He will need to follow up with his primary care physician within one week of discharge.  He will need to follow up with his neurologist as well.  He is to continue a heart healthy diet. He should also continue physical activity as tolerated.  Spoke with his mother, via phone, patient will likely stay with her for a couple of days.            Medication List         ALPRAZolam 1 MG tablet  Commonly known as:  XANAX  Take 1 mg by mouth 3 (three) times daily as needed for anxiety.     amLODipine 5 MG tablet  Commonly known as:  NORVASC  Take 5 mg by mouth daily.     chlordiazePOXIDE 10 MG capsule  Commonly known as:  LIBRIUM  - Take 1 pill three times a day for day 1.   - Take 1 pill two times a day for day 2.  - Take 1 pill daily once for day 3.    - Then OFF.     levETIRAcetam 500 MG tablet  Commonly known as:  KEPPRA  Take 500 mg by mouth 2 (two) times daily.     multivitamin with minerals Tabs tablet  Take 1 tablet by mouth daily.     oxyCODONE-acetaminophen 5-325 MG per tablet  Commonly known as:  PERCOCET/ROXICET  Take 1 tablet by mouth every 6 (six) hours as needed for moderate  pain.       Allergies  Allergen Reactions  . Opana [Oxymorphone Hcl]     Made him BLACKOUT  . Sulfur     Childhood reaction       The results of significant diagnostics from this hospitalization (including imaging, microbiology, ancillary and laboratory) are listed below for reference.    Significant Diagnostic Studies: Dg Chest 2 View  11/29/2013   CLINICAL DATA:  Altered mental status.  History of seizures.  EXAM: CHEST  2 VIEW  COMPARISON:  None.  FINDINGS: Cardiopericardial silhouette within normal limits. Mediastinal contours normal. Trachea midline. No airspace disease or effusion.  Nodular densities are present over both lung bases. These potentially represent nipple shadows. Repeat frontal view of the chest recommended with nipple markers as the next step in assessment.  IMPRESSION: 1. No acute cardiopulmonary disease. 2. Bilateral lower lobe pulmonary nodules which may represent nipple shadows. Repeat frontal view of the chest is recommended with nipple markers as the next step in assessment.   Electronically Signed  By: Dereck Ligas M.D.   On: 11/29/2013 13:12   Ct Head Wo Contrast  11/29/2013   CLINICAL DATA:  Altered mental status.  Headache and slurred speech.  EXAM: CT HEAD WITHOUT CONTRAST  TECHNIQUE: Contiguous axial images were obtained from the base of the skull through the vertex without intravenous contrast.  COMPARISON:  None.  FINDINGS: There is mild cerebral atrophy. There is no evidence of acute cortical infarct, intracranial hemorrhage, mass, midline shift, or extra-axial fluid collection. Patchy hypodensities in the periventricular white matter are nonspecific but compatible with mild chronic small vessel ischemic disease.  Orbits are unremarkable. Mastoid air cells are clear. Mild mucosal thickening is noted in the frontal and maxillary sinuses.  IMPRESSION: No evidence of acute intracranial abnormality.   Electronically Signed   By: Logan Bores   On: 11/29/2013  13:32    Microbiology: No results found for this or any previous visit (from the past 240 hour(s)).   Labs: Basic Metabolic Panel:  Recent Labs Lab 11/29/13 1505 11/30/13 0411  NA 141 139  K 4.0 3.4*  CL 100 102  CO2 23 26  GLUCOSE 107* 104*  BUN 10 10  CREATININE 0.87 0.82  CALCIUM 8.7 8.1*   Liver Function Tests:  Recent Labs Lab 11/29/13 1505  AST 44*  ALT 64*  ALKPHOS 61  BILITOT 1.0  PROT 7.7  ALBUMIN 4.1   No results found for this basename: LIPASE, AMYLASE,  in the last 168 hours  Recent Labs Lab 11/29/13 2030  AMMONIA 20   CBC:  Recent Labs Lab 11/29/13 1235 11/30/13 0411  WBC 10.9* 8.2  NEUTROABS 9.0*  --   HGB 17.5* 15.6  HCT 51.0 46.9  MCV 94.1 95.3  PLT 121* 109*   Cardiac Enzymes: No results found for this basename: CKTOTAL, CKMB, CKMBINDEX, TROPONINI,  in the last 168 hours BNP: BNP (last 3 results) No results found for this basename: PROBNP,  in the last 8760 hours CBG: No results found for this basename: GLUCAP,  in the last 168 hours     Signed:  Cristal Ford  [facility] 11/30/2013, 10:20 AM

## 2013-12-17 ENCOUNTER — Inpatient Hospital Stay: Payer: Self-pay | Admitting: Internal Medicine

## 2013-12-17 DIAGNOSIS — I10 Essential (primary) hypertension: Secondary | ICD-10-CM | POA: Diagnosis not present

## 2013-12-17 DIAGNOSIS — R4182 Altered mental status, unspecified: Secondary | ICD-10-CM | POA: Diagnosis not present

## 2013-12-17 DIAGNOSIS — Z882 Allergy status to sulfonamides status: Secondary | ICD-10-CM | POA: Diagnosis not present

## 2013-12-17 DIAGNOSIS — R4789 Other speech disturbances: Secondary | ICD-10-CM | POA: Diagnosis not present

## 2013-12-17 DIAGNOSIS — R4701 Aphasia: Secondary | ICD-10-CM | POA: Diagnosis not present

## 2013-12-17 DIAGNOSIS — I658 Occlusion and stenosis of other precerebral arteries: Secondary | ICD-10-CM | POA: Diagnosis not present

## 2013-12-17 DIAGNOSIS — F17200 Nicotine dependence, unspecified, uncomplicated: Secondary | ICD-10-CM | POA: Diagnosis not present

## 2013-12-17 DIAGNOSIS — G40909 Epilepsy, unspecified, not intractable, without status epilepticus: Secondary | ICD-10-CM | POA: Diagnosis not present

## 2013-12-17 DIAGNOSIS — R404 Transient alteration of awareness: Secondary | ICD-10-CM | POA: Diagnosis not present

## 2013-12-17 DIAGNOSIS — F3289 Other specified depressive episodes: Secondary | ICD-10-CM | POA: Diagnosis present

## 2013-12-17 DIAGNOSIS — F29 Unspecified psychosis not due to a substance or known physiological condition: Secondary | ICD-10-CM | POA: Diagnosis not present

## 2013-12-17 DIAGNOSIS — M1990 Unspecified osteoarthritis, unspecified site: Secondary | ICD-10-CM | POA: Diagnosis present

## 2013-12-17 DIAGNOSIS — F329 Major depressive disorder, single episode, unspecified: Secondary | ICD-10-CM | POA: Diagnosis present

## 2013-12-17 DIAGNOSIS — I6350 Cerebral infarction due to unspecified occlusion or stenosis of unspecified cerebral artery: Secondary | ICD-10-CM | POA: Diagnosis not present

## 2013-12-17 DIAGNOSIS — I6529 Occlusion and stenosis of unspecified carotid artery: Secondary | ICD-10-CM | POA: Diagnosis not present

## 2013-12-17 DIAGNOSIS — N390 Urinary tract infection, site not specified: Secondary | ICD-10-CM | POA: Diagnosis not present

## 2013-12-17 DIAGNOSIS — B1920 Unspecified viral hepatitis C without hepatic coma: Secondary | ICD-10-CM | POA: Diagnosis present

## 2013-12-17 DIAGNOSIS — F411 Generalized anxiety disorder: Secondary | ICD-10-CM | POA: Diagnosis not present

## 2013-12-17 DIAGNOSIS — G459 Transient cerebral ischemic attack, unspecified: Secondary | ICD-10-CM | POA: Diagnosis not present

## 2013-12-17 LAB — COMPREHENSIVE METABOLIC PANEL
ANION GAP: 7 (ref 7–16)
Albumin: 3.9 g/dL (ref 3.4–5.0)
Alkaline Phosphatase: 70 U/L
BUN: 5 mg/dL — AB (ref 7–18)
Bilirubin,Total: 0.5 mg/dL (ref 0.2–1.0)
CHLORIDE: 105 mmol/L (ref 98–107)
Calcium, Total: 8.3 mg/dL — ABNORMAL LOW (ref 8.5–10.1)
Co2: 29 mmol/L (ref 21–32)
Creatinine: 1.01 mg/dL (ref 0.60–1.30)
EGFR (African American): >60
EGFR (Non-African Amer.): >60
GLUCOSE: 99 mg/dL (ref 65–99)
Osmolality: 279 (ref 275–301)
Potassium: 4.1 mmol/L (ref 3.5–5.1)
SGOT(AST): 42 U/L — ABNORMAL HIGH (ref 15–37)
SGPT (ALT): 42 U/L
Sodium: 141 mmol/L (ref 136–145)
Total Protein: 7.8 g/dL (ref 6.4–8.2)

## 2013-12-17 LAB — URINALYSIS, COMPLETE
Bacteria: NONE SEEN
Bilirubin,UR: NEGATIVE
Blood: NEGATIVE
Glucose,UR: NEGATIVE mg/dL (ref 0–75)
KETONE: NEGATIVE
Leukocyte Esterase: NEGATIVE
Nitrite: NEGATIVE
PH: 6 (ref 4.5–8.0)
PROTEIN: NEGATIVE
RBC, UR: NONE SEEN /HPF (ref 0–5)
SPECIFIC GRAVITY: 1.002 (ref 1.003–1.030)
Squamous Epithelial: NONE SEEN
WBC UR: <1 /HPF (ref 0–5)

## 2013-12-17 LAB — CBC
HCT: 50.4 % (ref 40.0–52.0)
HGB: 16.3 g/dL (ref 13.0–18.0)
MCH: 31.2 pg (ref 26.0–34.0)
MCHC: 32.3 g/dL (ref 32.0–36.0)
MCV: 97 fL (ref 80–100)
Platelet: 170 10*3/uL (ref 150–440)
RBC: 5.22 10*6/uL (ref 4.40–5.90)
RDW: 14.3 % (ref 11.5–14.5)
WBC: 6.2 10*3/uL (ref 3.8–10.6)

## 2013-12-17 LAB — CK-MB
CK-MB: 0.8 ng/mL (ref 0.5–3.6)
CK-MB: 0.7 ng/mL (ref 0.5–3.6)

## 2013-12-17 LAB — TROPONIN I: Troponin-I: <0.02 ng/mL

## 2013-12-18 LAB — BASIC METABOLIC PANEL
Anion Gap: 5 — ABNORMAL LOW (ref 7–16)
BUN: 5 mg/dL — ABNORMAL LOW (ref 7–18)
Calcium, Total: 8.8 mg/dL (ref 8.5–10.1)
Chloride: 106 mmol/L (ref 98–107)
Co2: 30 mmol/L (ref 21–32)
Creatinine: 0.97 mg/dL (ref 0.60–1.30)
EGFR (African American): >60
EGFR (Non-African Amer.): >60
Glucose: 95 mg/dL (ref 65–99)
Osmolality: 278 (ref 275–301)
Potassium: 4 mmol/L (ref 3.5–5.1)
Sodium: 141 mmol/L (ref 136–145)

## 2013-12-18 LAB — LIPID PANEL
Cholesterol: 152 mg/dL (ref 0–200)
HDL Cholesterol: 43 mg/dL (ref 40–60)
Ldl Cholesterol, Calc: 90 mg/dL (ref 0–100)
Triglycerides: 93 mg/dL (ref 0–200)
VLDL Cholesterol, Calc: 19 mg/dL (ref 5–40)

## 2013-12-18 LAB — CK-MB: CK-MB: 0.7 ng/mL (ref 0.5–3.6)

## 2013-12-18 LAB — TROPONIN I

## 2013-12-21 DIAGNOSIS — R4789 Other speech disturbances: Secondary | ICD-10-CM | POA: Diagnosis not present

## 2013-12-31 ENCOUNTER — Emergency Department: Payer: Self-pay | Admitting: Internal Medicine

## 2013-12-31 DIAGNOSIS — Z79899 Other long term (current) drug therapy: Secondary | ICD-10-CM | POA: Diagnosis not present

## 2013-12-31 DIAGNOSIS — F411 Generalized anxiety disorder: Secondary | ICD-10-CM | POA: Diagnosis not present

## 2013-12-31 DIAGNOSIS — Z880 Allergy status to penicillin: Secondary | ICD-10-CM | POA: Diagnosis not present

## 2013-12-31 DIAGNOSIS — I10 Essential (primary) hypertension: Secondary | ICD-10-CM | POA: Diagnosis not present

## 2013-12-31 DIAGNOSIS — Z7902 Long term (current) use of antithrombotics/antiplatelets: Secondary | ICD-10-CM | POA: Diagnosis not present

## 2013-12-31 DIAGNOSIS — F17200 Nicotine dependence, unspecified, uncomplicated: Secondary | ICD-10-CM | POA: Diagnosis not present

## 2013-12-31 DIAGNOSIS — F329 Major depressive disorder, single episode, unspecified: Secondary | ICD-10-CM | POA: Diagnosis not present

## 2013-12-31 DIAGNOSIS — F3289 Other specified depressive episodes: Secondary | ICD-10-CM | POA: Diagnosis not present

## 2013-12-31 LAB — CBC
HCT: 53.3 % — ABNORMAL HIGH (ref 40.0–52.0)
HGB: 17.6 g/dL (ref 13.0–18.0)
MCH: 31.1 pg (ref 26.0–34.0)
MCHC: 33 g/dL (ref 32.0–36.0)
MCV: 94 fL (ref 80–100)
PLATELETS: 164 10*3/uL (ref 150–440)
RBC: 5.65 10*6/uL (ref 4.40–5.90)
RDW: 14.3 % (ref 11.5–14.5)
WBC: 9.7 10*3/uL (ref 3.8–10.6)

## 2013-12-31 LAB — DRUG SCREEN, URINE
Amphetamines, Ur Screen: NEGATIVE (ref ?–1000)
BARBITURATES, UR SCREEN: NEGATIVE (ref ?–200)
BENZODIAZEPINE, UR SCRN: POSITIVE (ref ?–200)
Cannabinoid 50 Ng, Ur ~~LOC~~: NEGATIVE (ref ?–50)
Cocaine Metabolite,Ur ~~LOC~~: NEGATIVE (ref ?–300)
MDMA (ECSTASY) UR SCREEN: POSITIVE (ref ?–500)
Methadone, Ur Screen: NEGATIVE (ref ?–300)
Opiate, Ur Screen: NEGATIVE (ref ?–300)
PHENCYCLIDINE (PCP) UR S: NEGATIVE (ref ?–25)
Tricyclic, Ur Screen: NEGATIVE (ref ?–1000)

## 2013-12-31 LAB — COMPREHENSIVE METABOLIC PANEL
ALBUMIN: 4.3 g/dL (ref 3.4–5.0)
Alkaline Phosphatase: 66 U/L
Anion Gap: 10 (ref 7–16)
BUN: 8 mg/dL (ref 7–18)
Bilirubin,Total: 0.8 mg/dL (ref 0.2–1.0)
CALCIUM: 8.8 mg/dL (ref 8.5–10.1)
CHLORIDE: 101 mmol/L (ref 98–107)
Co2: 28 mmol/L (ref 21–32)
Creatinine: 1.03 mg/dL (ref 0.60–1.30)
EGFR (African American): >60
Glucose: 134 mg/dL — ABNORMAL HIGH (ref 65–99)
Osmolality: 278 (ref 275–301)
Potassium: 3.8 mmol/L (ref 3.5–5.1)
SGOT(AST): 48 U/L — ABNORMAL HIGH (ref 15–37)
SGPT (ALT): 33 U/L
Sodium: 139 mmol/L (ref 136–145)
Total Protein: 8.4 g/dL — ABNORMAL HIGH (ref 6.4–8.2)

## 2013-12-31 LAB — ETHANOL
Ethanol %: <0.003 % (ref 0.000–0.080)
Ethanol: <3 mg/dL

## 2013-12-31 LAB — ACETAMINOPHEN LEVEL: Acetaminophen: <2 ug/mL

## 2013-12-31 LAB — SALICYLATE LEVEL: Salicylates, Serum: <1.7 mg/dL

## 2014-01-02 DIAGNOSIS — F411 Generalized anxiety disorder: Secondary | ICD-10-CM | POA: Diagnosis not present

## 2014-01-19 DIAGNOSIS — H2510 Age-related nuclear cataract, unspecified eye: Secondary | ICD-10-CM | POA: Diagnosis not present

## 2014-01-27 DIAGNOSIS — M25579 Pain in unspecified ankle and joints of unspecified foot: Secondary | ICD-10-CM | POA: Diagnosis not present

## 2014-01-27 DIAGNOSIS — F411 Generalized anxiety disorder: Secondary | ICD-10-CM | POA: Diagnosis not present

## 2014-01-27 DIAGNOSIS — F5102 Adjustment insomnia: Secondary | ICD-10-CM | POA: Diagnosis not present

## 2014-01-27 DIAGNOSIS — I10 Essential (primary) hypertension: Secondary | ICD-10-CM | POA: Diagnosis not present

## 2014-01-27 DIAGNOSIS — F329 Major depressive disorder, single episode, unspecified: Secondary | ICD-10-CM | POA: Diagnosis not present

## 2014-01-27 DIAGNOSIS — F3289 Other specified depressive episodes: Secondary | ICD-10-CM | POA: Diagnosis not present

## 2014-01-27 DIAGNOSIS — R4701 Aphasia: Secondary | ICD-10-CM | POA: Diagnosis not present

## 2014-01-27 DIAGNOSIS — I6789 Other cerebrovascular disease: Secondary | ICD-10-CM | POA: Diagnosis not present

## 2014-01-27 DIAGNOSIS — G40909 Epilepsy, unspecified, not intractable, without status epilepticus: Secondary | ICD-10-CM | POA: Diagnosis not present

## 2014-04-14 DIAGNOSIS — G40909 Epilepsy, unspecified, not intractable, without status epilepticus: Secondary | ICD-10-CM | POA: Diagnosis not present

## 2014-04-14 DIAGNOSIS — M79671 Pain in right foot: Secondary | ICD-10-CM | POA: Diagnosis not present

## 2014-04-14 DIAGNOSIS — M150 Primary generalized (osteo)arthritis: Secondary | ICD-10-CM | POA: Diagnosis not present

## 2014-04-14 DIAGNOSIS — F411 Generalized anxiety disorder: Secondary | ICD-10-CM | POA: Diagnosis not present

## 2014-04-14 DIAGNOSIS — I10 Essential (primary) hypertension: Secondary | ICD-10-CM | POA: Diagnosis not present

## 2014-04-14 DIAGNOSIS — Z23 Encounter for immunization: Secondary | ICD-10-CM | POA: Diagnosis not present

## 2014-07-09 DIAGNOSIS — R410 Disorientation, unspecified: Secondary | ICD-10-CM | POA: Diagnosis not present

## 2014-07-09 DIAGNOSIS — F23 Brief psychotic disorder: Secondary | ICD-10-CM | POA: Diagnosis not present

## 2014-07-09 DIAGNOSIS — R4182 Altered mental status, unspecified: Secondary | ICD-10-CM | POA: Diagnosis not present

## 2014-07-11 ENCOUNTER — Inpatient Hospital Stay: Payer: Self-pay | Admitting: Psychiatry

## 2014-07-11 DIAGNOSIS — F23 Brief psychotic disorder: Secondary | ICD-10-CM | POA: Diagnosis not present

## 2014-07-16 DIAGNOSIS — F328 Other depressive episodes: Secondary | ICD-10-CM | POA: Diagnosis not present

## 2014-07-17 DIAGNOSIS — F328 Other depressive episodes: Secondary | ICD-10-CM | POA: Diagnosis not present

## 2014-08-01 DIAGNOSIS — M722 Plantar fascial fibromatosis: Secondary | ICD-10-CM | POA: Diagnosis not present

## 2014-08-01 DIAGNOSIS — M7751 Other enthesopathy of right foot: Secondary | ICD-10-CM | POA: Diagnosis not present

## 2014-08-01 DIAGNOSIS — M79671 Pain in right foot: Secondary | ICD-10-CM | POA: Diagnosis not present

## 2014-08-08 DIAGNOSIS — G40909 Epilepsy, unspecified, not intractable, without status epilepticus: Secondary | ICD-10-CM | POA: Diagnosis not present

## 2014-08-08 DIAGNOSIS — I10 Essential (primary) hypertension: Secondary | ICD-10-CM | POA: Diagnosis not present

## 2014-08-08 DIAGNOSIS — F411 Generalized anxiety disorder: Secondary | ICD-10-CM | POA: Diagnosis not present

## 2014-08-08 DIAGNOSIS — M150 Primary generalized (osteo)arthritis: Secondary | ICD-10-CM | POA: Diagnosis not present

## 2014-08-08 DIAGNOSIS — M79671 Pain in right foot: Secondary | ICD-10-CM | POA: Diagnosis not present

## 2014-09-02 NOTE — Consult Note (Signed)
PATIENT NAME:  [name], [name] MR#:  [id number] DATE OF BIRTH:  May 26, 1950  DATE OF CONSULTATION:  04/13/2013  REFERRING PHYSICIAN:  ED physician CONSULTING PHYSICIAN:  Cordelia Pen. Gretel Acre, MD  REASON FOR CONSULTATION: "I got locked out of my apartment. The police thought I was confused so they brought me here."   HISTORY OF PRESENT ILLNESS: The patient is a 64 year old male who was brought to the Emergency Room by the police as he appeared very confused and was brought by the police. He reported that he got locked out of the apartment at his apartment complex last night. He reported that he started having vivid nightmares and was telling me in details about the same. However, when he was redirected he reported that he was confused about the whole matter and he was actually locked out of the building. He reported that he is currently getting Percocet from his dentist as well as Xanax for the past 5 years from his primary psychiatrist. When asked about the psychiatrist, he reported "I would rather not say."  The patient reported that he takes Xanax 3 to 4 times per day. He also mentioned that he had 13 teeth pulled out 5 weeks ago and then he started getting Percocet from his dentist. He reported that he currently lives by himself. The patient stated that he does not abuse his Xanax. However, he has filled 1 mg #150 tablets of Xanax yesterday as reported by his pharmacy at [facility]. He denies abusing medications, but appears unsteady on his feet with slurred speech and disorganized presentation. He was presenting vague history and his mother who was present at the bedside also reported that the patient appeared very confused at this time. He kept on appearing and providing disorganized history. He appeared to be somewhat agitated at his mother also as he stated that he has to go back to his home and pack up all his apartment as they are trying to clean the apartment complex from tomorrow.   PAST  PSYCHIATRIC HISTORY: The patient reported that he has been diagnosed with anxiety disorder and is following with Dr. Toy Care in [place] and is getting Xanax for the same. He is currently getting 1 mg 5 times per day and received a prescription of 150 pills yesterday. The patient did not bring the prescription bottle so he does not know how many pills he has taken. He however appeared very confused. He is also getting Percocet from his dentist. He reported that he has never tried to harm himself and does not have any history of previous psychiatric hospitalizations.   CURRENT MEDICAL HISTORY: Hypertension.   CURRENT MEDICATIONS: Xanax 1 mg 5 times daily, Norvasc 5 mg once daily.   ALLERGIES: AMOXICILLIN, OPANA, SULFA DRUGS.   SOCIAL HISTORY: The patient was married twice in the past. He denied any pending charges. He reported that he does not have any children. He lives by himself and his parents live close by. The patient has long history of substance abuse in the past and was admitted for substance abuse detox in the past. He previously attended substance abuse IOP programs here in this facility, the last time in August 2013.   ANCILLARY DATA: Temperature 97.6, pulse 67, respirations 18, blood pressure 122/67.   Glucose 138, BUN 18, creatinine 1.23, sodium 137, potassium 3, chloride 102, bicarbonate 24, anion gap 11. Blood alcohol level less than 3. Protein 7.5, albumin 4, bilirubin 0.9, alkaline phosphatase 48, AST 39, ALT 32. WBC 6.5, RBC  5.38, hemoglobin 16.7, hematocrit 49.3, platelet count 131, MCV 92, RDW 13.7. UDS is not available.   REVIEW OF SYSTEMS: CONSTITUTIONAL: No fever or chills. No weight changes.  EYES: No double or blurred vision.  RESPIRATORY: No shortness of breath or cough.  CARDIOVASCULAR: No chest pain or orthopnea.  GASTROINTESTINAL: No abdominal pain, nausea, vomiting, or diarrhea.  GENITOURINARY: No incontinence or frequency.  ENDOCRINE: No heat or cold intolerance.   LYMPHATIC: No anemia or easy bruising.  INTEGUMENTARY: The patient has several bruises on his body, especially on the leg and feet which shows that he might have recurrent falls in the past, but he reported that they are all due to some scratches.   MENTAL STATUS EXAMINATION: The patient is a moderately built male who appeared his stated age. His speech was somewhat slurred. Eye contact was fair. Mood was depressed and anxious. Affect was congruent. Thought process was tangential. Thought content was nondelusional. He appeared confused during the interview. He was unable to provide a coherent history. He demonstrated poor insight and judgment. He denied having any suicidal ideations or plans. Intelligence was low average.   DIAGNOSTIC IMPRESSION: AXIS I:  1.  Mood disorder, not otherwise specified.  2.  Sedative hypnotic anxiolytic dependence.  AXIS II: None.  AXIS III: Hypertension.   TREATMENT PLAN: The patient will be admitted to the inpatient behavioral health unit for stabilization and safety. I discussed the case with the patient and his mother at length and they demonstrated understanding. He will be started on Librium 25 mg p.o. q. 6 hours on a regular basis to prevent him from going into withdrawals from the Xanax use and monitor closely in the unit. Once he becomes clinically stable, he will be discharged back safely.   Thank you for allowing me to participate in the care of this patient.  ____________________________ Cordelia Pen. Gretel Acre, MD usf:sb D: 04/13/2013 15:18:57 ET T: 04/13/2013 15:44:54 ET JOB#: [id number]  cc: Cordelia Pen. Gretel Acre, MD, <Dictator> Jeronimo Norma MD ELECTRONICALLY SIGNED 04/15/2013 11:15

## 2014-09-02 NOTE — Discharge Summary (Signed)
PATIENT NAME:  [name], [name] MR#:  [id number] DATE OF BIRTH:  Jun 24, 1950  DATE OF ADMISSION:  04/13/2013 DATE OF DISCHARGE:  04/20/2013  HOSPITAL COURSE: See dictated history and physical for details of admission. This 64 year old man with a history of substance abuse was found confused, unable to take care of himself, outside. He appeared to be delirious, having withdrawal from Xanax. He was treated with Librium and a withdrawal protocol. His delirium gradually improved. He did not have a seizure. He remained fairly isolated, not coming to groups or involving himself in treatment, but was pleasant in one-to-one therapy. He gradually showed some improved insight. At one point, he was at least partially agreeable to going to the alcohol and drug abuse treatment center, but then changed his mind and felt like he would prefer not to go there. He was not meeting commitment criteria. He did agree to go to the intensive outpatient program and met with Dr. Marcello Moores, and this will be his followup plan at discharge. The patient was treated with citalopram for symptoms of depression, which he tolerated well, and will be given that at discharge.   DISCHARGE MEDICATIONS: Citalopram 20 mg p.o. daily.   DISPOSITION: Follow-up with the intensive outpatient program and with Dr. Toy Care. The patient has been counseled to not resume the use of Xanax.   LABORATORY RESULTS: Chemistry: Slightly low potassium 3.4, glucose slightly high at 119. Head CT unremarkable. CBC: Slightly low platelet count, 146. Drug screen positive for benzodiazepines. Urinalysis unremarkable.   MENTAL STATUS EXAM AT DISCHARGE: Neatly dressed and groomed man who looks his stated age, cooperative and pleasant. Good eye contact. Normal psychomotor activity. Speech normal rate, tone and volume. Affect euthymic, reactive, appropriate. Mood stated as good. Thoughts lucid. No loosening of associations. No delusions. Denies auditory or visual  hallucinations. Denies suicidal or homicidal ideation. Shows good insight and judgment. Normal intelligence. Alert and oriented x4.   DIAGNOSIS, PRINCIPAL AND PRIMARY:  AXIS I: Benzodiazepine dependence.   SECONDARY DIAGNOSES: AXIS I: Depression, not otherwise specified.   AXIS II: Deferred.   AXIS III: No diagnosis.   AXIS IV: Moderate stress from recent social disruption.   AXIS V: Functioning at time of discharge 55.      ____________________________ Gonzella Lex, MD jtc:cg D: 04/20/2013 23:17:03 ET T: 04/20/2013 23:30:35 ET JOB#: [id number]  cc: Gonzella Lex, MD, <Dictator> Gonzella Lex MD ELECTRONICALLY SIGNED 04/21/2013 17:26

## 2014-09-02 NOTE — H&P (Signed)
PATIENT NAME:  [name], TINNON MR#:  [id number] DATE OF BIRTH:  05-01-1951  DATE OF ADMISSION:  04/13/2013  IDENTIFYING INFORMATION AND CHIEF COMPLAINT: A 64 year old man brought to the hospital by police after he was found confused and disoriented outdoors.   CHIEF COMPLAINT: "They just locked me out."   HISTORY OF PRESENT ILLNESS: Information obtained from the patient and the chart. According to the chart, he was brought in by police who found him wandering outside near his apartment. He appeared to be very confused and disoriented and they thought he should come to the hospital. The patient believes that his parents had gotten angry at him and had forced him to come into the hospital, although I am not sure where he gets that idea. He does state that he had just picked up his Xanax prescription yesterday and had taken 5 mg of Xanax at once because he was feeling "uptight." He also may have taken some pain medication, although that is unclear. He denies that he has been having suicidal ideation. Denies that he has been feeling very depressed. Denies any psychotic symptoms. He has been having increased chronic pain and disability recently because of a fracture to his right foot, which is still causing him pain. Additionally, he has had some falls recently, which he blames on the breaking of his foot, although I am sure that his Xanax use contributes to it. He also said he needs to move out of his apartment because they are doing some kind of repairs on it and that that it has been a stress on him.   PAST PSYCHIATRIC HISTORY: He was here last just a little over a year ago in the hospital for detox from Xanax. He has a history of Xanax dependence. After that detox, he engaged in the intensive outpatient program for quite a while before dropping out. He has a past history of depressive episodes, but mostly long-term chronic use of high doses of Xanax. Denies a history of suicidal behavior. Denies a  history of homicidal behavior. He has been on antidepressants in the past, but it looks like, especially recently, he is just getting Xanax from what I can tell.   PAST MEDICAL HISTORY: He broke his right foot from falling off a roof  sometime recently and is still having pain from it. According to the chart, he has a history of high blood pressure, hepatitis C as well, but is not apparently getting treatment for it.   FAMILY HISTORY: Positive for depression and anxiety.   SOCIAL HISTORY: The patient is twice divorced, has no children. He gets disability. He does not work, and spends most of the time around the house. He says that he sometimes helps out his parents. His elderly parents apparently live nearby and he has an ongoing relationship with them.   CURRENT MEDICATIONS: From what we can tell, Xanax 1 mg 5 times a day.   ALLERGIES: AMOXICILLIN, OPANA AND SULFA DRUGS.   REVIEW OF SYSTEMS: Currently complains of pain in his foot, mild anxiety, but denies suicidal ideation. Denies psychotic symptoms. Denies any other specific physical symptoms.   MENTAL STATUS EXAMINATION: Slightly disheveled man, who looks his stated age, cooperative with the interview. Good eye contact. Psychomotor activity a little bit slow and hesitant. Speech also a little bit slow, but not slurred anymore and easy to understand. Affect a little constricted. Mood stated as okay. Thoughts appear to be slow, a little bit concrete at times, he loses his train  of thought easily, but does not make any psychotic or bizarre statements. Denies auditory or visual hallucinations. Denies suicidal or homicidal ideation. Judgment and insight affable. Intelligence probably normal.   PHYSICAL EXAMINATION:  GENERAL: Thin gentleman has several scabs and recent skin wounds on his knees and hands, which he says were from falls he suffered. He walks with a bit of a limp. His right foot had a recent fracture, but seems to be healing.  HEENT:  Pupils equal and reactive. Face symmetric. No teeth. Strength and reflexes symmetric throughout. Full range of motion at extremities other than at his foot.  LUNGS: Clear without wheezes.  HEART: Regular rate and rhythm.  ABDOMEN: Soft, nontender, normal bowel sounds.  VITAL SIGNS: Temperature 98.4, pulse 73, respirations 20, blood pressure 130/89.   LABORATORY RESULTS: Chemistry panel normal except for elevated glucose 138, slightly low potassium at 3. CBC low platelet count at 131. Urinalysis unremarkable. Drug screen positive only for benzodiazepines. Chest x-ray and head CT normal.   ASSESSMENT: This is a 64 year old man with benzodiazepine dependence, who was detoxed and doing well last year, but now has managed to get himself back on 5 mg a day of Xanax. It seems to clearly be too much for him causing him to get confused, have high risk for falls, probably poor self-care overall. He really would do better with detox and getting back into some sobriety.   TREATMENT PLAN: He agrees to get detoxed. He is on Librium right now. We will gradually use that on other medicines as needed for detoxing him. Engage him in substance abuse treatment here. In the past, he went to the IOP program and it would be ideal if we could possibly get him re-involved in that. I will talk to Dr. Marcello Moores about it.   DIAGNOSIS, PRINCIPAL AND PRIMARY:  AXIS I: Benzodiazepine dependence.  SECONDARY DIAGNOSES:  AXIS I: Delirium due to substance intoxication, resolving.  AXIS II: Deferred.  AXIS III: Recent fracture of his foot, chronic pain, unsteadiness.  AXIS IV: Moderate to severe from chronic isolation and substance use.  AXIS V: Functioning at time of evaluation 35.   ____________________________ Gonzella Lex, MD jtc:aw D: 04/14/2013 14:47:10 ET T: 04/14/2013 14:57:24 ET JOB#: [id number]  cc: Gonzella Lex, MD, <Dictator> Gonzella Lex MD ELECTRONICALLY SIGNED 04/14/2013 16:01

## 2014-09-03 NOTE — H&P (Signed)
PATIENT NAME:  [name], [name] MR#:  [id number] DATE OF BIRTH:  May 12, 1951  DATE OF ADMISSION:  12/17/2013  PRIMARY CARE PHYSICIAN: Lavera Guise, MD  REFERRING PHYSICIAN: Baird Cancer. Jacqualine Code, MD  CHIEF COMPLAINT: Slurred speech; unable to recall the words.  HISTORY OF PRESENT ILLNESS:  Mr. Stauber is a 64 year old male with a history of anxiety, on chronic use of benzodiazepines, who had recent admission in January 2015 after the patient was found to have benzodiazepine withdrawal seizures, after the patient missed a few doses. The patient takes 1 mg of Atrovent 3 times a day. The patient states that this morning, he could not find his words. Concerning this, he called his mother and was brought to the Emergency Department. Workup in the Emergency Department, a CT head without contrast and other labs were completely unremarkable. The patient continues to smoke 1 pack a day. Concerning this, the decision was made to observe the patient. The patient is an extremely poor historian, extremely tangential; unable to obtain much history from the patient. The patient starts the sentence and remains calm, despite prompting the patient.   PAST MEDICAL HISTORY:  1.  Hypertension.  2.  Osteoarthritis.  3.  Kidney stones.  4.  Depression and anxiety. 5.  Hepatitis C.   PAST SURGICAL HISTORY:  1.  Tonsillectomy.  2.  Lithotripsy.   ALLERGIES:  1.    2.  SULFA DRUGS.  3.  AMOXICILLIN.   HOME MEDICATIONS:  1.  Potassium chloride 20 mEq once a day.  2.  Multivitamin 1 tablet once a day.  3.  Magnesium oxide 400 mg once a day.  4.  Keppra 500 mg every 12 hours.  5.  Celexa 10 mg once a day.  6.  Amlodipine 5 mg once a day.  7.  Alprazolam 1 mg 3 times a day.   SOCIAL HISTORY: Continues to smoke 1 pack a day. Denies drinking alcohol or using illicit drugs. Lives by himself.   FAMILY HISTORY: Heart problems.   REVIEW OF SYSTEMS:  CONSTITUTIONAL: Experiences generalized weakness.  EYES: No  change in vision.  ENT: No change in hearing.  RESPIRATORY: No cough or shortness of breath.  CARDIOVASCULAR: No chest pain, palpations.  GASTROINTESTINAL: No nausea, vomiting, abdominal pain.  GENITOURINARY: No dysuria or hematuria. HEMATOLOGIC: No easy bruising or bleeding.  SKIN: No rash or lesions.  MUSCULOSKELETAL: No joint pains and aches.  NEUROLOGIC: Has difficulty findings words; no weakness or numbness in any part of the body.   PHYSICAL EXAMINATION:  GENERAL: This is a well-built, age-appropriate male lying down in the bed, not in distress.  VITAL SIGNS: Temperature 98, pulse 53, blood pressure 142/86, respiratory rate of 16, oxygen saturation is 97% on room air.  HEENT: Head normocephalic, atraumatic. There is no scleral icterus. Conjunctivae normal. Pupils equal,  round, and react to light. Extraocular movements are intact. Mucous membranes moist. No pharyngeal erythema.  NECK: Supple. No lymphadenopathy. No JVD. No carotid bruit.  CHEST: No focal tenderness.  LUNGS: Bilaterally clear to auscultation.  HEART: S1, S2 regular. No murmurs are heard.  ABDOMEN: Bowel sounds plus. Soft, nontender, nondistended.  EXTREMITIES: No pedal edema. Pulses 2+.  SKIN: No rash or lesions.  MUSCULOSKELETAL: Good range of motion in all the extremities.  NEUROLOGIC: The patient is alert, oriented to place, person, and time. Cranial nerves II through XII intact. Motor 5/5 in the upper and lower extremities.   LABORATORY DATA: CT head without contrast, CBC and CMP are  completely within normal limits. Troponin I set is within normal limits. EKG, 12-lead, normal sinus rhythm with no ST-T wave abnormalities.   ASSESSMENT AND PLAN: Mr. Dineen is a 64 year old male who comes with unable to find words.  1.  Transient ischemic attack; question   medication versus transient ischemic attack. Concerning about the patient's history of smoking. The patient is at risk of having stroke. Will obtain MRI of the  brain, carotid Dopplers and echocardiogram.  2.  Continued tobacco use. Counseled with the patient.  3.  Hypertension, currently well-controlled. Continue with the amlodipine.  4.  Benzodiazepine dependence; strongly consider decreasing the doses of benzodiazepines, which keeps the patient at risk of falls and other complications.  5.  History of seizure disorder. Continue with Keppra.  6.  Keep the patient on deep vein thrombosis prophylaxis with Lovenox.   TIME SPENT: 50 minutes     ____________________________ Monica Becton, MD pv:ms D: 12/18/2013 02:04:46 ET T: 12/18/2013 05:44:09 ET JOB#: [id number]  cc: Monica Becton, MD, <Dictator> Lavera Guise, MD Grier Mitts Montee Tallman MD ELECTRONICALLY SIGNED 12/19/2013 20:58

## 2014-09-03 NOTE — Discharge Summary (Signed)
PATIENT NAME:  [name], [name] MR#:  [id number] DATE OF BIRTH:  08-Apr-1951  DATE OF ADMISSION:  12/17/2013 DATE OF DISCHARGE:  12/19/2013  PRIMARY CARE PHYSICIAN:  Lavera Guise, MD.  NEUROLOGIST:  Hemang K. Manuella Ghazi, MD.  FINAL DIAGNOSES:  1.  Possible transient ischemic attack versus anxiety with expressive aphasia.  2.  Seizure disorder.  3.  Hypertension.  4.  Anxiety.   MEDICATIONS ON DISCHARGE: Include: Multivitamin 1 tablet daily, Celexa 10 mg at bedtime, amlodipine 5 mg daily, magnesium oxide 400 mg daily, potassium chloride 20 mEq daily, Xanax 1 mg 0.5 tablet 3 times a day, Keppra 1000 mg every 12 hours, Plavix 75 mg daily, pravastatin 20 mg at bedtime.   DIET: Low sodium diet, regular consistency.   ACTIVITY: As tolerated.   FOLLOWUP: Follow up with Dr. Manuella Ghazi, neurology, as scheduled; Dr. Clayborn Bigness in 1-2 weeks.   HOSPITAL COURSE: The patient was admitted 12/17/2013 and discharged 12/19/2013. He came in with slurred speech, unable to get his words out. He was admitted with a suspected TIA. MRI of the brain was ordered. Carotid ultrasound and echocardiogram ordered.   LABORATORY AND RADIOLOGICAL DATA DURING THE HOSPITAL COURSE: Included: Glucose 99, BUN 5, creatinine 1.01, sodium 141, potassium 4.1, chloride 105, CO2 was 29, calcium 8.3. Liver function tests: AST, slightly elevated at 42. White blood cell count 6.2, H and H 16.3 and 50.4, platelet count 170. Two troponins negative. LDL 90, HDL 43, triglycerides 93. Followup creatinine on August 8th was 0.97.   CT scan of the head was negative.   Urinalysis negative.   MRI of the brain done without contrast showed no acute intracranial abnormality. Stable, noncontrast MRI appearance from previous MRI.   Echocardiogram showed EF 50-55%, low-normal global left ventricular systolic function.   Ultrasound of the carotids showed bilateral plaque, left greater than right. Estimated less than 50%.   HOSPITAL COURSE PER PROBLEM  LIST:  1.  For the patient's suspected transient ischemic attack versus anxiety reaction: The patient had aphasia. Symptoms waxed and waned throughout the entire hospital course. I was going to send the patient home on August 8th after the MRI was negative but the patient had another episode. I called Dr. Valora Corporal in consultation who believed that the physical examination and story was inconsistent with stroke but could be an anxiety reaction. Recommended following up with neurology, increasing Keppra to 1 gram b.i.d.   We did do Plavix and pravastatin just in case this was a transient ischemic attack.  2.  For the patient's seizure disorder, Keppra was increased to 1 gram b.i.d.  3.  For hypertension, blood pressure is stable on Norvasc.  4.  For anxiety. The patient has a lot of anxiety, not working at this point. His wife left him. Follow up with primary care physician. Can consider psychiatric consultation as an outpatient.    Time spent on discharge: 40 minutes.     ____________________________ Tana Conch. Leslye Peer, MD rjw:lt D: 12/19/2013 14:23:21 ET T: 12/19/2013 15:30:51 ET JOB#: [id number]  cc: Tana Conch. Leslye Peer, MD, <Dictator> Hemang K. Manuella Ghazi, MD Lavera Guise, MD Marisue Brooklyn MD ELECTRONICALLY SIGNED 12/20/2013 11:51

## 2014-09-03 NOTE — Discharge Summary (Signed)
PATIENT NAME:  [name], [name] MR#:  [id number] DATE OF BIRTH:  1950/07/10  DATE OF ADMISSION:  06/07/2013 DATE OF DISCHARGE:  06/08/2013  DISCHARGE DIAGNOSES: 1.  Syncopal episode likely seizure. Was taking Xanax 2 mg b.i.d. for years and stopped 3 days ago, as he did not have any pills.  2.  Hypertension, uncontrolled.    CONDITION ON DISCHARGE:  Stable.  MEDICATIONS ON DISCHARGE: 1.  Multivitamin 1 tablet orally once a day.  2.  Celexa 10 mg oral daily.  3.  Alprazolam 1 mg oral 2 times a day for 8 days.   DIET ON DISCHARGE: Low-sodium diet. Diet consistency regular.   FOLLOWUP: Advised to follow up in neurologically with Dr. Manuella Ghazi in 1 to 2 days and advised not to drive for at least six months.   HISTORY OF PRESENT ILLNESS: A 64 year old male with known history of seizure, not on medication.  He was planning to go to the psychiatric appointment around 1:00 p.m., then around 12:30, he got disoriented for about 5 to 10 minutes. He noticed a laceration underneath his chin and he does not know what happened in those 5 to 10 minutes. So there was some laceration and blood was oozing through that so he decided to come to Emergency Room. While in the ED, his chin laceration was repaired with sutures, but he was found having troponin of 0.08 and so was admitted for further work-up.   HOSPITAL COURSE AND STAY:  As he was admitted with this syncopal episode and further work-up, his serial troponin remained negative. On further questioning, he told that he was taking Xanax 2 mg twice daily, but for the last few days as he ran out of the medicine, he stopped taking Xanax currently and so where as it most likely was a seizure episode due to benzodiazepine withdrawal. He was started on Keppra. I spoke to Dr. Manuella Ghazi, neurologist, and he advised to follow within 2 days in office and advised to send him home. So I explained to the patient and he agreed to follow up in neurology clinic.   OTHER MEDICAL  ISSUES:  1.  Borderline elevation troponin, most likely it was due to subclinical ischemia due to seizure. Non-ST-elevation ruled out because troponin remained stable and no EKG changes.  2.  Uncontrolled hypertension. Clonidine was given and medications were adjusted.  3.  Smoking. Counseling was done.  4.  Chronic alcoholism. Advised to quit drinking habit.  There was no sign of alcohol withdrawal during the hospital course.   IMPORTANT LABORATORY RESULTS: WBC count 12.1, hemoglobin 17.4, platelet count 203. Creatinine was 1.12, sodium 134 and potassium was 3.6, bilirubin was 0.9. SGPT was 31, SGOT 42.  CT scan of the head without contrast was done which showed no acute abnormality. Urinalysis on admission was negative. Urine for toxicology was positive for benzodiazepines. Troponin level went up to 0.16 and came down to 0.13. Creatinine level came down to 0.85 on further followup.    TIME SPENT ON THIS DISCHARGE: 40 minutes.    ____________________________ Ceasar Lund Anselm Jungling, MD vgv:dp D: 06/11/2013 22:13:35 ET T: 06/12/2013 08:33:18 ET JOB#: [id number]  cc: Ceasar Lund. Anselm Jungling, MD, <Dictator> Hemang K. Manuella Ghazi, MD Vaughan Basta MD ELECTRONICALLY SIGNED 06/17/2013 23:57

## 2014-09-03 NOTE — Consult Note (Signed)
Psychiatry: Consult note for this 64 year old gentleman who presented to the emergency room complaining of anxiety.  Consultation for appropriate treatment. presented voluntarily to the emergency room complaining of anxiety.  Stating that he was extremely nervous.  On interview with him the patient is able to tell me that he thinks he was born with severe anxiety.  He says he has been treated for anxiety throughout his life.  His history is somewhat vague and rambling.  He indicates that his primary psychiatrist had been prescribing antianxiety medicine for him.  He seems to indicate that he no longer has any of this medicine and needs to take it again.  At another point he stated that he wished to stop it voluntarily.  He told one interviewer that he was trying to get himself detoxed from his medication.  He is a little unclear about exactly what medicine it is seeming to indicate that he is taking both alprazolam and lorazepam.  Patient denies suicidal or homicidal ideation. psychiatric history is unclear.  Patient is a poor historian and becomes disorganized and bizarre in his behavior when questioned closely.  It's not clear whether he has had previous hospitalizations or not.  Not clear what other medications if any he has been treated with. history: Evidently he has dyslipidemia and possibly high blood pressure.  Other medical history unclear. history: He was not able to give any information about this. status exam: Patient appears his stated age.  When allowed to rest on his own me he is calm with no observed bizarre behavior.  When I approached him for an interview he changed his behavior dramatically.  Speech became stammering and eventually stopped altogether.  Behavior included stereotyped tremblings and jerking movements.  Eye contact intermittent.  Thoughts halting and eventually completely blocked.  Denied suicidal or homicidal ideation.  Unclear about hallucinations.  Unable to cooperate with any  cognitive testing. was such a poor historian that ultimately it was impossible to conclude what the best course of action would be.  It's not clear whether he is really being prescribed benzodiazepines or not.  The history he gave did not match up with what I could locate on the Internet data base.  Not clear that he is acutely dangerous on the other hand he was not able to give any information about a living situation. plan: Not clear again that he requires hospital level admission but he was not able to cooperate with discharge planning.  I have restarted him on his medication including low dosage benzodiazepines.  He is also apparently on Keppra and may have a history of seizure disorder although he was not able to give clear history about that.  He can be reevaluated once he is back on his medicine and possibly somewhat more stabilized.  Possibly he can be discharged from the emergency room if he is able to cooperate with some discharge planning. Anxiety disorder NOS, rule out benzodiazepine dependence.  Total time spent 50 minutes.  Electronic Signatures: Gonzella Lex (MD)  (Signed on 21-Aug-15 23:06)  Authored  Last Updated: 21-Aug-15 23:06 by Gonzella Lex (MD)

## 2014-09-03 NOTE — Consult Note (Signed)
PATIENT NAME:  [name], [name] MR#:  [id number] DATE OF BIRTH:  March 28, 1951  DATE OF CONSULTATION:  01/02/2014  REFERRING PHYSICIAN:   CONSULTING PHYSICIAN:  Gustabo Gordillo K. Lasya Vetter, MD  SUBJECTIVE: The patient is a 64 year old white male, not employed and has been on disability and has been living at [facility] for 5 years and he likes it there. The patient is being followed by Dr. Toy Care in [place], [place] for generalized anxiety disorder. He has been prescribed Xanax 2 mg p.o. t.i.d. and according to information obtained from the staff, the patient had been self medicating and taking more than he was supposed to, to the extent that he ran out early and the pharmacy would not fill it for an early refill. The patient had been at [facility] Emergency Room at [facility] for a few days, that is since 12/31/2013, until today, that is 01/02/2014.   ALCOHOL AND DRUGS: Denied. Denies street or prescription drug abuse.  MENTAL STATUS: The patient is alert and oriented to place, person and time, fully aware of situation that got him here. He appears anxious when questions are asked. Otherwise, staff reports that he is calm and quiet and cooperative. No agitation. No psychosis. Memory is intact. Cognition is intact. Denies any ideas or plans to hurt himself or others. He is eager home to [facility] where he has been for 5 years and he likes it there.  IMPRESSION: Generalized anxiety disorder. I recommend discharging the patient home and he is a voluntary patient. The patient reports that his family will give him a ride to [facility] and he will keep up his followup appointment in Dr. Starleen Arms office and will call as soon as possible for appointment.   ____________________________ Wallace Cullens. Franchot Mimes, MD skc:TT D: 01/02/2014 18:43:08 ET T: 01/02/2014 20:40:03 ET JOB#: [id number]  cc: Arlyn Leak K. Franchot Mimes, MD, <Dictator> Dewain Penning MD ELECTRONICALLY SIGNED 01/08/2014 14:11

## 2014-09-03 NOTE — Consult Note (Signed)
Referring Physician:  Monica Becton :   Primary Care Physician:  Monica Becton [facility], [address], [place], [place] [address], [place] [phone]  Reason for Consult: Admit Date: 17-Dec-2013  Chief Complaint: aphasia  Reason for Consult: transient neurologic deficit   History of Present Illness: History of Present Illness:   64 yo RHD M presents to [facility] secondary to episodes of aphasia.  Pt reports that he becomes shaky and cant get his words out.  He denies any focal weakness, numbness or vision changes.  Pt reports that he was diagnosed with seizures when he was shaking all over previously and that he has had multiple episodes.  These have increased lately for unknown reasons.  He has never had confusion after the episodes, tongue biting or incontinence.  It sounds like he has never even lost consciousness.  ROS:  General denies complaints   HEENT no complaints   Lungs no complaints   Cardiac no complaints   GI no complaints   GU no complaints   Musculoskeletal no complaints   Extremities no complaints   Skin no complaints   Neuro seizure   Endocrine no complaints   Psych anxiety   Past Medical/Surgical Hx:  Seizures:   HTN:   chronic smoker:   arthritis:   kidney stones:   depression:   anxiety:   hepatitis c:   Anxiety:   Denies surgical history.:   Tonsillectomy:   Lithotripsy:   Past Medical/ Surgical Hx:  Past Medical History reviewed by me as above   Past Surgical History reviewed by me as above   Home Medications: Medication Instructions Last Modified Date/Time  clopidogrel 75 mg oral tablet 1 tab(s) orally once a day 08-Aug-15 11:56  pravastatin 20 mg oral tablet 1 tab(s) orally once a day (at bedtime) 08-Aug-15 11:56  multivitamin 1 tab(s) orally once a day 07-Aug-15 18:17  CeleXA 10 mg oral tablet 1 tab(s) orally once a day (at bedtime) 07-Aug-15 18:17  levETIRAcetam 500 mg oral tablet 1 tab(s) orally every  12 hours 07-Aug-15 18:17  amLODIPine 5 mg oral tablet 1 tab(s) orally once a day 07-Aug-15 18:17  magnesium oxide 400 mg (241.3 mg elemental magnesium) oral tablet 1 tab(s) orally once a day 07-Aug-15 18:17  potassium chloride 20 mEq oral tablet, extended release 1 tab(s) orally once a day 07-Aug-15 18:17  ALPRAZolam 1 mg oral tablet 0.5 tab(s) orally 3 times a day 08-Aug-15 11:55   Allergies:  Amoxicillin: Unknown  Opana: Pain  Sulfa drugs: Rash  Allergies:  Allergies see above, reviewed by me   Social/Family History: Employment Status: disabled  Lives With: alone  Living Arrangements: apartment  Social History: no tob, no EtOH, no illicits  Family History: no seizures, no strokes   Vital Signs: **Vital Signs.:   08-Aug-15 11:49  Vital Signs Type Routine  Temperature Temperature (F) 98.2  Celsius 36.7  Pulse Pulse 62  Respirations Respirations 20  Systolic BP Systolic BP 115  Diastolic BP (mmHg) Diastolic BP (mmHg) 68  Mean BP 90  Pulse Ox % Pulse Ox % 94  Pulse Ox Activity Level  At rest  Oxygen Delivery Room Air/ 21 %   Physical Exam: General: very anxious, nl weight  HEENT: normocephalic, sclera nonicteric, oropharynx clear  Neck: supple, no JVD, no bruits  Chest: CTA B, no wheezing, good movement  Cardiac: RRR, no murmurs, no edema, 2+ pulses  Extremities: no C/C/E, FROM   Neurologic Exam: Mental Status: alert and oriented x  3,  follows complex commands,  has stuttering with good naming and repetition, poor eye contact  Cranial Nerves: PERRLA, EOMI, nl VF, face symmetric, tongue midline, shoulder shrug equal  Motor Exam: 5/5 B, normal tone, high frequency tremor B hands, giveaway weakness noted  Deep Tendon Reflexes: 1+/4 B, downgoing plantars B  Sensory Exam: pinprick, temperature, and vibration intact B  Coordination: FTN and HTS WNL   Lab Results: LabObservation:  08-Aug-15 11:06   OBSERVATION Reason for Test  Hepatic:  07-Aug-15 16:36   Bilirubin,  Total 0.5  Alkaline Phosphatase 70 (46-116 NOTE: New Reference Range 11/30/13)  SGPT (ALT) 42 (14-63 NOTE: New Reference Range 11/30/13)  SGOT (AST)  42  Total Protein, Serum 7.8  Albumin, Serum 3.9  Routine Chem:  08-Aug-15 02:03   Cholesterol, Serum 152  Triglycerides, Serum 93  HDL (INHOUSE) 43  VLDL Cholesterol Calculated 19  LDL Cholesterol Calculated 90 (Result(s) reported on 18 Dec 2013 at 12:44PM.)  Glucose, Serum 95  BUN  5  Creatinine (comp) 0.97  Sodium, Serum 141  Potassium, Serum 4.0  Chloride, Serum 106  CO2, Serum 30  Calcium (Total), Serum 8.8  Anion Gap  5  Osmolality (calc) 278  eGFR (African American) >60  eGFR (Non-African American) >60 (eGFR values <25m/min/1.73 m2 may be an indication of chronic kidney disease (CKD). Calculated eGFR is useful in patients with stable renal function. The eGFR calculation will not be reliable in acutely ill patients when serum creatinine is changing rapidly. It is not useful in  patients on dialysis. The eGFR calculation may not be applicable to patients at the low and high extremes of body sizes, pregnant women, and vegetarians.)  Cardiac:  08-Aug-15 02:03   CPK-MB, Serum 0.7 (Result(s) reported on 18 Dec 2013 at 03:03AM.)  Troponin I < 0.02 (0.00-0.05 0.05 ng/mL or less: NEGATIVE  Repeat testing in 3-6 hrs  if clinically indicated. >0.05 ng/mL: POTENTIAL  MYOCARDIAL INJURY. Repeat  testing in 3-6 hrs if  clinically indicated. NOTE: An increase or decrease  of 30% or more on serial  testing suggests a  clinically important change)  Routine UA:  07-Aug-15 17:55   Color (UA) Straw  Clarity (UA) Clear  Glucose (UA) Negative  Bilirubin (UA) Negative  Ketones (UA) Negative  Specific Gravity (UA) 1.002  Blood (UA) Negative  pH (UA) 6.0  Protein (UA) Negative  Nitrite (UA) Negative  Leukocyte Esterase (UA) Negative (Result(s) reported on 17 Dec 2013 at 06:17PM.)  RBC (UA) NONE SEEN  WBC (UA) <1 /HPF   Bacteria (UA) NONE SEEN  Epithelial Cells (UA) NONE SEEN  Result(s) reported on 17 Dec 2013 at 06:17PM.  Routine Hem:  07-Aug-15 16:36   WBC (CBC) 6.2  RBC (CBC) 5.22  Hemoglobin (CBC) 16.3  Hematocrit (CBC) 50.4  Platelet Count (CBC) 170 (Result(s) reported on 17 Dec 2013 at 04:58PM.)  MCV 97  MCH 31.2  MCHC 32.3  RDW 14.3   Radiology Results: [place]    08-Aug-15 12:51, [place]orea[place]ot[place] Doppler Bilateral  [place] Doppler Bilateral   REASON FOR EXAM:    slurred speach  COMMENTS:       PROCEDURE: [place] - [place]oreaCAROTID DOPPLER BILATERAL  - Dec 18 2013 12:51PM     CLINICAL DATA:  Slurred speech.    EXAM:  BILATERAL CAROTID DUPLEX ULTRASOUND    TECHNIQUE:  GPearline Cablesscale imaging, color Doppler and duplex ultrasound were  performed of bilateral carotid and vertebral arteries in the neck.  COMPARISON:  None.  FINDINGS:  Criteria: Quantification of carotid stenosis is based on velocity  parameters that correlate the residual internal carotid diameter  with NASCET-based stenosis levels, using the diameter of the distal  internal carotid lumen as the denominator for stenosis measurement.    The following velocity measurements were obtained:    RIGHT    ICA:  59/26 cm/sec    CCA:  26/37 cm/sec    SYSTOLIC ICA/CCA RATIO:  0.7  DIASTOLIC ICA/CCA RATIO:  1.6    ECA:  84 cm/sec    LEFT    ICA:  74/30 cm/sec    CCA:  85/88 cm/sec    SYSTOLIC ICA/CCA RATIO:  0.9    DIASTOLIC ICA/CCA RATIO:  1.3    ECA:  44 cm/sec  RIGHT CAROTID ARTERY: There is a mild amount of partially calcified  plaque at the level of the distal bulb and proximal ICA. Estimated  right ICA stenosis is less than 50%.    RIGHT VERTEBRAL ARTERY: Antegrade flow with normal waveform and  velocity.    LEFT CAROTID ARTERY: There is a mild amount of partially calcified  plaque at the level of the bulb and proximal ICA. Plaque burden on  the left is greater than the right. Estimated left ICA stenosis is  less  than 50%.    LEFT VERTEBRAL ARTERY: Antegrade flow with normal waveform and  velocity.   IMPRESSION:  Bilateral atherosclerotic plaque, left greater than right. Estimated  bilateral ICA stenoses are less than 50%.      Electronically Signed    By: Aletta Edouard M.D.    On: 12/18/2013 15:56         Verified By: Azzie Roup, M.D.,  MRI:    08-Aug-15 10:52, MRI Brain Without Contrast  MRI Brain Without Contrast   REASON FOR EXAM:    slurred speach  COMMENTS:       PROCEDURE: MR  - MR BRAIN WO CONTRAST  - Dec 18 2013 10:52AM     CLINICAL DATA:  64 year old male altered mental status and slurred  speech. Initial encounter.    EXAM:  MRI HEAD WITHOUT CONTRAST    TECHNIQUE:  Multiplanar, multiecho pulse sequences of the brain and surrounding  structures were obtained without intravenous contrast.  COMPARISON:  Non contrast head CT 12/17/2013 and earlier. Brain MRI  04/22/2010.    FINDINGS:  Cerebral volume has not significantly changed since 2011. Major  intracranial vascular flow voids are stable. No restricted diffusion  to suggest acute infarction. No midline shift, mass effect, evidence  of mass lesion, ventriculomegaly, extra-axial collection or acute  intracranial hemorrhage. Cervicomedullary junction and pituitary are  within normal limits. Negative visualized cervical spine.    Stable mild for age nonspecific cerebral white matter T2 and FLAIR  hyperintensity, mostly subcortical. No cortical encephalomalacia. No  new signal abnormality, with deep gray matter nuclei, brainstem, and  cerebellum within normal limits for age.  Visible internal auditory structures appear normal. Mastoids are  clear.    Visualized orbit soft tissues are within normal limits. Normal non  contrast appearance of the cavernous sinus. Mild to moderate  paranasal sinus mucosal thickening is new.    Visualized scalp soft tissues are within normal limits. Normal bone  marrow signal.      IMPRESSION:  1.  No acute intracranial abnormality.  2. Stable non contrast MRI appearance the brain since 2011.  3. New mild to moderate paranasal sinus inflammation.  Electronically Signed  By: Lars Pinks M.D.    On: 12/18/2013 11:05         Verified By: Gwenyth Bender. Nevada Crane, M.D.,  CT:    07-Aug-15 16:46, CT Head Without Contrast  CT Head Without Contrast   REASON FOR EXAM:    confusion, difficulty speaking  COMMENTS:       PROCEDURE: CT  - CT HEAD WITHOUT CONTRAST  - Dec 17 2013  4:46PM     CLINICAL DATA:  Confusion and difficulty speaking.    EXAM:  CT HEAD WITHOUT CONTRAST    TECHNIQUE:  Contiguous axial images were obtained from the base of the skull  through the vertex without intravenous contrast.    COMPARISON:  06/07/2013  FINDINGS:  Stable mild atrophy. The brain demonstrates no evidence of  hemorrhage, infarction, edema, mass effect, extra-axial fluid  collection, hydrocephalus or mass lesion. The skull is unremarkable.     IMPRESSION:  No acute findings.      Electronically Signed    By: Aletta Edouard M.D.    On: 12/17/2013 16:50         Verified By: Azzie Roup, M.D.,   Radiology Impression: Radiology Impression: MRI of brain personally reviewed by me and shows trace white matter changes   Impression/Recommendations: Recommendations:   prior notes reviewed by me reviewed by me   Aphasia-  this appears to be more psychiatric in origin to me and very inconsistent during the episode.  This is also seen with stuttering and shaking in the hands which all go along with anxiety.  No MRI evidence of stroke and these episodes do not sound like seizures.  These episodes are often hard to examine due to fluctating nature. Trace white matter disease-  MRI looks like pt with mild migraines not stroke Anxiety-  not well controlled and maybe causing 1. continue plavix ok to continue Keppra 1gm BID even though this may worsen anxiety recommend psychiatric  consult in/outpatient continue SSRI if episodes continue, recommend transfer to [facility] for long term EEG monitoring no driving or operating heavy machinery x 6 months ok to d/c from Neuro standpoint with f/u at [facility] in 3-4 weeks  Electronic Signatures: Jamison Neighbor (MD)  (Signed 08-Aug-15 19:06)  Authored: REFERRING PHYSICIAN, Primary Care Physician, Consult, History of Present Illness, Review of Systems, PAST MEDICAL/SURGICAL HISTORY, HOME MEDICATIONS, ALLERGIES, Social/Family History, NURSING VITAL SIGNS, Physical Exam-, LAB RESULTS, RADIOLOGY RESULTS, Recommendations   Last Updated: 08-Aug-15 19:06 by Jamison Neighbor (MD)

## 2014-09-03 NOTE — H&P (Signed)
PATIENT NAME:  [name], [name] MR#:  [id number] DATE OF BIRTH:  21-Jun-1950  DATE OF ADMISSION:  06/07/2013  PRIMARY CARE PHYSICIAN:  [facility], Lavera Guise, MD  NEUROLOGY:  [facility].    REQUESTING PHYSICIAN: Baird Cancer. Anthony Code, MD  CHIEF COMPLAINT: Unresponsiveness.   HISTORY OF PRESENT ILLNESS: The patient is a 64 year old male with a known history of seizure disorder, not on medication; hypertension, chronic smoker, anxiety/depression who is being admitted for syncope and/or seizure. The patient remembers until 12:15 to 12:25 when he picked up his clothes basket and was hoping to get ready to go to his psychiatry appointment which was scheduled at 1:00 p.m. with Dr. Marcello Moores. Then, the next time he remembers was 12:30, when he was disoriented. He reports about 5 to 10 minutes being out. He noticed some laceration underneath his chin seen with blood coming so he decided to come to the Emergency Department. While in the ED, his chin laceration was repaired with sutures and he was found to have a troponin of 0.08 and is being admitted for further evaluation and management. The patient denies any other prior symptoms. He does report having a history of seizure but had not been started on any antiseizure medication by his neurologist at [facility]. He denies any headache or any other symptoms at this time.   PAST MEDICAL HISTORY: 1.  Hypertension.  2.  Hepatitis C.  3.  Seizure.   FAMILY HISTORY: Positive for depression and anxiety.   PAST PSYCHIATRIC HISTORY:  History of Xanax dependence, also history of chronic use of high dose of Xanax. History of depression and/or anxiety.   SOCIAL HISTORY: He is divorced x 2. No children. He is getting disability. He smokes 1 pack of cigarettes daily for the last 40 years. He is a heavy alcohol drinker but has not drank lately, reports drinking 3 bourbon shot glasses the night before yesterday, did not drink anything yesterday. He denies any IV  drugs of abuse.   ALLERGIES: AMOXICILLIN, OPANA AND SULFA DRUGS.   MEDICATIONS AT HOME:  Multivitamin once daily. He is listed to be on Xanax 2 mg p.o. b.i.d. although he reports that he was tapered off on last admission at [facility] when he was discharged on December 11th.    REVIEW OF SYSTEMS: CONSTITUTIONAL: No fever, fatigue, weakness.  EYES: No blurred or double vision.  ENT: No tinnitus, ear pain.  RESPIRATORY: No cough, wheezing, hemoptysis.  CARDIOVASCULAR: No chest pain, orthopnea, edema.  GASTROINTESTINAL:  No nausea, vomiting or diarrhea.   GENITOURINARY:  No hematuria.   ENDOCRINE:  No polyuria or polydipsia.   HEMATOLOGY:  No anemia or easy bruising. SKIN:  No rash or lesion.  MUSCULOSKELETAL:  No arthritis or muscle cramps.  NEUROLOGIC: History of possible syncope or seizure.  He had unwitnessed syncope with some chin laceration this afternoon.  PSYCHIATRY: History of anxiety and/or depression, and Xanax dependence.   PHYSICAL EXAMINATION: VITAL SIGNS: Temperature 98, heart rate 74 per minute, respirations 18 per minute, blood pressure 184/92 mmHg. He was saturating 95% on room air.  GENERAL:  The patient is a 64 year old male lying in the bed comfortably without any acute distress.  EYES: Pupils equal, round and reactive to light and accommodation.  No scleral icterus.   HEENT:  Head atraumatic, normocephalic.  Oropharynx and nasopharynx clear.  NECK: Supple. No jugular venous distention. No thyroid enlargement or tenderness.  LUNGS: Clear to auscultation bilaterally. No wheezing, rales, rhonchi or crepitation.  CARDIOVASCULAR: S1  and S2 normal. No murmurs, rubs or gallops.   ABDOMEN: Soft, nontender, nondistended. Bowel sounds present. No organomegaly or masses.   EXTREMITIES:  No pedal edema, cyanosis or clubbing.   NEUROLOGIC:  Nonfocal examination.  Cranial nerves II through XII intact.  Muscle strength 5 out of 5.  Extremity sensation intact.   PSYCHIATRIC:  The  patient is alert and oriented x 3.   SKIN: No obvious rash, lesion, ulcer.    LABORATORY, DIAGNOSTIC AND RADIOLOGICAL DATA:   1.  Normal BMP except sodium of 134. Normal liver function tests except direct bilirubin of 0.40, AST of 42. Troponin 0.08. Urine tox with positive benzodiazepine. CBC within normal limits except white count 12.1. UA  was negative.  2.  CT scan of the head without contrast showed no acute intracranial abnormality. Cerebral atrophy seen.   IMPRESSION AND PLAN:   1.  Brief episode of unwitnessed syncope, certainly could be seizure.  His laceration has been repaired in the ED. This could be from alcohol or benzodiazepine withdrawal. Will consult neurology, obtain EEG, start him on clinical institute withdrawal assessment of alcohol protocol as he does have extensive alcoholism history and he does feel he might undergo withdrawal although I do not see any tremors at this point.  2.  Borderline elevation of troponin could be due to subclinical ischemia from seizure. We will do serial troponin monitoring on telemetry. 3.  Uncontrolled hypertension. We will start him on clonidine and adjust medication as needed. 4.  Tobacco abuse. He was counseled for about 3 minutes. He is not willing to quit at this time, denied any need of nicotine placement therapy while in the hospital.  5.  West STATUS: Full West.   TOTAL TIME:  Taking care of this patient is 55 minutes.     ____________________________ Lucina Mellow. Manuella Ghazi, MD vss:cs D: 06/07/2013 19:23:00 ET T: 06/07/2013 19:52:16 ET JOB#: [id number]  cc: Dimitri Shakespeare S. Manuella Ghazi, MD, <Dictator> Lavera Guise, MD Lucina Mellow [facility] MD ELECTRONICALLY SIGNED 06/08/2013 13:55

## 2014-09-04 NOTE — Discharge Summary (Signed)
PATIENT NAME:  [name], [name] MR#:  [id number] DATE OF BIRTH:  November 15, 1950  DATE OF ADMISSION:  10/16/2011 DATE OF DISCHARGE:  10/28/2011  HISTORY OF PRESENT ILLNESS:  Mr. Aycen Porreca is a 64 year old male presenting with depression as well as physiologic dependence on benzodiazepine Xanax.  He had been increasing his Xanax up to 9 mg per day for over a month.  He has been desperate to get off of the Xanax but has tried without success.   He also has had a depressed mood with difficulty concentrating, anhedonia, and thoughts of hopelessness and helplessness, with intermittent suicidal ideation.   ANCILLARY CLINICAL DATA: Mr. Alberto does have a history of essential hypertension. He did develop hypertension independent of any withdrawal symptoms. Clonidine was added to his calcium channel blocker and increased to 0.2 mg b.i.d. with good results.   HOSPITAL COURSE: Mr. Ardis was admitted to the inpatient behavioral health unit and underwent milieu and group psychotherapy. Celexa was added to his regimen and increased to 20 mg daily without adverse effects.   His mood progressively improved. He also described a long-term history of excess worry, feeling on edge, and muscle tension. He was given Neurontin, increased to 900 mg t.i.d.   The Neurontin was added for anxiety because the patient described the most severe anxiety experience as when he goes to a [employer] such as [employer]. When he goes out into the eating area he gets severe symptoms of anxiety. (There has been efficacy of Neurontin with social anxiety.)   Also Vistaril was added at 25 to 50 mg q.i.d. p.r.n. anxiety.   CONDITION ON DISCHARGE: By 10/28/2011 Mr. Christopherson was showing normal social behavior. His mood, interest, energy, concentration, and appetite were all normal. His hope and future goals were constructive and intact. He was not having any adverse medication effects.   MENTAL STATUS EXAM UPON DISCHARGE:  Mr. Wuthrich is alert. His eye contact is good. Concentration is normal. He is oriented to all spheres. His memory is intact to immediate, recent, and remote. His fund of knowledge, intelligence, and use of language are normal. Speech involves normal rate and prosody without dysarthria. Thought process is logical, coherent, and goal-directed. No looseness of associations. No tangents. Thought content: No thoughts of harming himself or others. No delusions or hallucinations. Insight is intact. Judgment is intact. Affect is broad and appropriate. Mood within normal limits.   DISCHARGE DIAGNOSES:  AXIS I:  1. Sedative hypnotic dependence with physiologic dependence resolved.  2. Generalized anxiety disorder, stable.  3. Major depressive disorder, recurrent, in clinical remission.  4. Social anxiety disorder, stable control.   AXIS II: Deferred.   AXIS III: Hypertension. See past medical history.   AXIS IV: Primary support group.   AXIS V: 55.   DISCHARGE DIET: Regular.   ACTIVITY: Routine.   FOLLOW-UP APPOINTMENTS: He will follow up with [facility] for medication management as well as outpatient substance abuse counseling. He declined a residential chemical dependence rehabilitation and he is not committable.   He is not at risk to harm himself or others. He agrees to call emergency services immediately for any thoughts of harming himself, thoughts of harming others, or distress.   He will follow up with his primary care physician within one week of discharge.   DISCHARGE MEDICATIONS:  1. Norvasc 10 mg 1 daily, #10. 2. Celexa 20 mg 1 daily, #20. 3. Dilantin 100 mg, four at bedtime, #40.  4. Neurontin 900 mg, 1 t.i.d., #30.  5. Clonidine 0.2 mg, 1 b.i.d., #20.  6. Hydroxyzine 25-mg tablets, 1-2 q.i.d. p.r.n. anxiety, #30.   ADDITIONAL NOTE:  Mr. Vincent Dilantin was increased to 400 mg at bedtime due to a slightly subtherapeutic level for prevention of his seizures. (He does have a  history of epilepsy versus withdrawal seizures. His primary care physician started the Dilantin due to the possibility of primary epilepsy.)     ____________________________ Drue Stager. Aland Chestnutt, MD [id number] D: 10/29/2011 11:15:15 ET T: 10/29/2011 11:31:30 ET JOB#: [id number]  cc: Drue Stager. Jebediah Macrae, MD, <Dictator> Billie Ruddy MD ELECTRONICALLY SIGNED 11/04/2011 23:52

## 2014-09-04 NOTE — H&P (Signed)
PATIENT NAME:  [name], KEOWN MR#:  [id number] DATE OF BIRTH:  [birth year]  DATE OF ADMISSION:  10/16/2011  CHIEF COMPLAINT AND IDENTIFYING DATA: Mr. 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   HISTORY OF PRESENT ILLNESS: The patient has been experiencing over two weeks of depressed mood, difficulty concentrating, anhedonia, thoughts of helplessness and hopelessness as well as poor appetite and poor concentration.   He also has been having suicidal ideation as well as intolerable nightmares.   PAST PSYCHIATRIC HISTORY: Mr. Piatek does have a long-term history of depression and anxiety. The anxiety includes excessive worry, feeling on edge, and muscle tension.   He was hospitalized at [facility] in July of 2011. At that time he underwent detox for a combination of dependence with Xanax and drinking. He had tried to stop on his own and had developed multiple seizures.   At that time he did require an initial admission to the 2-A general medical unit.   He also has received at least two DWI's in the past along with experiencing blackouts.   FAMILY PSYCHIATRIC HISTORY: Anxiety, depression.   SOCIAL HISTORY: Divorced twice. Children none. His support system includes his sister.   EDUCATION: One year of college.   OCCUPATION: Architect but out of work.   PAST MEDICAL HISTORY: 1. Hypertension.  2. Hepatitis C.  ALLERGIES: Amoxicillin, Opana, sulfa drugs.  MEDICATIONS: 1. Norvasc 10 mg daily. 2. Xanax as above.  3. He also takes Neurontin 300 mg per day. 4. There was a note regarding Dilantin but the dosage is unknown and it is unclear whether he has a history of primary seizures versus withdrawal seizures versus both.   LABORATORY DATA: Urine drug screen positive for benzodiazepines.   Urinalysis, TSH, alcohol, complete metabolic  panel, and CBC all unremarkable.   REVIEW OF SYSTEMS: Constitutional, HEENT, mouth, neurologic, psychiatric, cardiovascular, respiratory, gastrointestinal, genitourinary, skin, musculoskeletal, hematologic, lymphatic, endocrine, metabolic all unremarkable.   PHYSICAL EXAMINATION:  VITAL SIGNS: Temperature 98.3, pulse 79, respiratory rate 18, blood pressure 170/95.   GENERAL APPEARANCE: Mr. Bublitz is a middle-aged male lying in a supine position in his hospital bed with no abnormal involuntary movements. He has no cachexia. His muscle is normal. His grooming is mildly disheveled. Hygiene normal.   HEENT: Head normocephalic, atraumatic. Pupils equally round and reactive light and accommodation. Oropharynx clear without erythema.   EXTREMITIES: No cyanosis, clubbing, or edema.   SKIN: Normal turgor. No rashes.   NECK: Supple, nontender. No masses.   LUNGS: Clear to auscultation. No wheezing, rhonchi, or rales.   CARDIOVASCULAR: Regular rate and rhythm. No murmurs, rubs, or gallops.   ABDOMEN: Nondistended. Bowel sounds positive. Soft, nontender. No masses.   GENITOURINARY: Deferred.   NEUROLOGIC: Cranial nerves II through XII intact. General sensory intact throughout to light touch. Motor 5/5 strength throughout. Deep tendon reflexes normal strength and symmetry throughout. No Babinski. Coordination intact by finger-to-nose bilaterally.   MENTAL STATUS EXAMINATION: Mr. Runkles is alert. His concentration is mildly decreased. Eye contact intermittent. He is oriented to all spheres. His fund of knowledge, intelligence, and use of language are normal. His speech is mildly soft. He has normal prosody. No dysarthria. Thought process is logical, coherent, and goal directed. No looseness of associations or tangents. Thought content suicidal ideation is passive. He contracts for safety in the hospital. There is no thoughts of harming others. No delusions or hallucinations. Insight is  partial.  Judgment is partial. Affect constricted. Mood depressed.    ASSESSMENT:  AXIS I:  1. Sedative hypnotic dependence.  2. Major depressive disorder, recurrent, severe.  3. Generalized anxiety disorder.   AXIS II: Deferred.   AXIS III:  1. Hypertension.  2. History of Hepatitis C.   AXIS IV: Primary support group.   AXIS V: 30.   Mr. Mccarley is at risk for suicide outside of the supportive environment of the hospital. He also is at risk for severe morbidity and potential mortality if he experiences withdrawal from benzodiazepines.   PLAN:  1. Therefore, will admit Mr. Vigen to the Inpatient Behavioral Unit for further evaluation and treatment.  2. He will undergo the Ativan detox protocol.  3. He will be further evaluated for medication treatment of his anxiety and depression.  4. Milieu and group psychotherapy.   ____________________________ Drue Stager. Tegan Burnside, MD jsw:drc D: 10/16/2011 23:37:14 ET T: 10/17/2011 06:27:43 ET JOB#: [id number]  cc: Drue Stager. Angla Delahunt, MD, <Dictator> Billie Ruddy MD ELECTRONICALLY SIGNED 10/17/2011 17:05

## 2014-09-04 NOTE — Consult Note (Signed)
Patient seen for discharge disposition at the request of attending MD to explore possibility of CD-IOP at this inpatient discharge. He was interviewed and oriented to the CD-IOP and is an excellent candidate for treatment. He was highly expressive about the need for the CD-IOP level of care indicating his need for treatment. He does plan to meet in the CD-IOP. Assigned nurse informed of result of evaluation.   Electronic Signatures: Laqueta Due (PsyD)  (Signed on 13-Jun-13 22:40)  Authored  Last Updated: 13-Jun-13 22:40 by Laqueta Due (PsyD)

## 2014-09-06 DIAGNOSIS — Z125 Encounter for screening for malignant neoplasm of prostate: Secondary | ICD-10-CM | POA: Diagnosis not present

## 2014-09-06 DIAGNOSIS — E782 Mixed hyperlipidemia: Secondary | ICD-10-CM | POA: Diagnosis not present

## 2014-09-06 DIAGNOSIS — I10 Essential (primary) hypertension: Secondary | ICD-10-CM | POA: Diagnosis not present

## 2014-09-06 DIAGNOSIS — Z0001 Encounter for general adult medical examination with abnormal findings: Secondary | ICD-10-CM | POA: Diagnosis not present

## 2014-09-08 DIAGNOSIS — B370 Candidal stomatitis: Secondary | ICD-10-CM | POA: Diagnosis not present

## 2014-09-08 DIAGNOSIS — J301 Allergic rhinitis due to pollen: Secondary | ICD-10-CM | POA: Diagnosis not present

## 2014-09-08 DIAGNOSIS — R7301 Impaired fasting glucose: Secondary | ICD-10-CM | POA: Diagnosis not present

## 2014-09-08 DIAGNOSIS — F411 Generalized anxiety disorder: Secondary | ICD-10-CM | POA: Diagnosis not present

## 2014-09-11 NOTE — Consult Note (Signed)
PATIENT NAME:  [name], [name] MR#:  [id number] DATE OF BIRTH:  [birth year]  DATE OF CONSULTATION:  07/10/2014  REFERRING PHYSICIAN:   CONSULTING PHYSICIAN:  Kerah Hardebeck K. Franchot Mimes, MD  PLACE OF DICTATION: [facility] Emergency Room.  AGE: 64 years.  SEX: Male.  RACE: White.  SUBJECTIVE: The patient was seen again in consultation in [facility] Emergency Room. Staff reported that he did not sleep at all and he was agitated and he was given Ativan 1 mg, which did not help him .   OBJECTIVE: The patient was seen in lying in bed and then he was walking around. Alert and oriented, calmer, but still appears to be anxious very much. The patient again stated that he feels as if he is withdrawing and he needs medication for the same. He does not appear to be responding to external stimuli. No psychosis. Denies auditory or visual hallucinations. Denies suicidal or homicidal plan. Insight and judgment guarded. Impulse control is fair.   IMPRESSION: Alcohol abuse by history and probably going through withdrawal and having anxiety from the same.   RECOMMENDATIONS: I recommend Ativan 1 mg p.o. q. 4 hours p.r.n. for anxiety and agitation, Zyprexa 5 mg p.o. b.i.d., trazodone 100 mg p.o. at bedtime to help him rest overnight. The patient is to be reevaluated in the a.m. of 02/29/2106 for appropriate disposition plans.       ____________________________ Wallace Cullens. Franchot Mimes, MD skc:ts D: 07/10/2014 18:54:01 ET T: 07/11/2014 01:30:53 ET JOB#: [id number]  cc: Arlyn Leak K. Franchot Mimes, MD, <Dictator> Dewain Penning MD ELECTRONICALLY SIGNED 07/11/2014 7:47

## 2014-09-11 NOTE — Consult Note (Signed)
Brief Consult Note: Diagnosis: Brief Psychotic Disorder.   Patient was seen by consultant.   Recommend further assessment or treatment.   Orders entered.   Comments: Pt seen in Ed. Remains delusional and talking about his penis and stated that he has a concern in his bones and was operated upon on Thursday at [place]. He appeared very apprehensive and fixated on the same. He stated that he was lives in his own apartment and his father is trying to sell his sister apartment. He denied use of drugs or alcohol.  Pt was evaluated by ED physician and his PE is WNL.   Plan: He will be admitted to input BHU for stabilization and safety.  Start Seroquel 50mg  po TID Continue Trazodone Will continue to monitor.  Electronic Signatures: Jeronimo Norma (MD)  (Signed 29-Feb-16 11:31)  Authored: Brief Consult Note   Last Updated: 29-Feb-16 11:31 by Jeronimo Norma (MD)

## 2014-09-11 NOTE — Consult Note (Signed)
PATIENT NAME:  [name], [name] MR#:  [id number] DATE OF BIRTH:  Sep 02, 1950  DATE OF CONSULTATION:  00/27/2016  CONSULTING PHYSICIAN:  Jahlon Baines K. Kemya Shed, MD  SUBJECTIVE:  The patient was seen in consultation in room 21A, [facility] Emergency Room in [place].  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  The patient is divorced for many years and reports that he lives by himself.  The patient was brought to the Emergency Room at [facility] by his parents and mother is 40 years old, father is 24 years old.    CHIEF COMPLAINT: "I was brought here for my alcohol drinking and help as needed."    HISTORY OF PRESENT ILLNESS:  The patient reports that he drinks alcohol as much as he can get and said "too much."  He drinks alcohol, as much as he can get his hands on.  He reports that his last drink was 2 days ago when he drank two 12-packs of beer and  he drinks sometimes on a regular basis and as much as he can get.  History of inpatient hospital and psychiatry at [facility] on 2 occasions for alcohol detox.  The patient reports that he is being followed at the clinic for [facility] for his medical problems and he is on Xanax.  Last appointment was 2 weeks ago with a PA, at the clinic.Marland Kitchen Next appointment coming up on 07/11/2014.  In addition he reports that he is being followed at [facility] for his seizures.  He does not know the name of doctor and he, again, stated that he is on Xanax for his seizures.  He, again, stated that his doctor's name is Dr. Helene Kelp.  He reports that his next appointment is coming up on February 29, Monday, 2016.  He report that he is, again, on a memory pill, and then he was asked if it was Aricept, and  he said yes.  No history of suicide attempt, not being followed by any psychiatrist or mental health clinic or outpatient basis  Alcohol and drugs as stated above, alcohol drinking, no history of DWI, never arrested for public drunkenness.  No history of DTs or tremors.  Denies street or prescription drug abuse, denies smokingcigs. mental status.  The patient was seen lying in bed.  He appears sleepy but is arousable.  He closes his eyes through the interview.  He stated that he was in a hospital and he stated this was 1992 and then later he stated this was 2016.  He stated this was August 27.  He stated this was [place] but this was [facility].  When this same question was asked several times he repeatedly stated this was [facility] and the hospital [facility].  Denies responding to any internal stimuli.  Denies any auditory or visual hallucinations.  He repeatedly stated that he needs something for his withdrawal from his alcohol as he felt the same when he was admitted twice to [facility].  Denies any ideas or plans to hurt himself further, is very confused and further interview could not be done.  Insight and judgment guarded.  Impulse control poor.  IMPRESSION:   1.  Alcohol dependence, chronic, continuous by history.   2.  History of seizure disorder.   PLAN:  I discussed the patient with the Emergency Room physician, Dr. Marchelle Gearing and probably the patient needs detox from alcohol and that will help him calm down and will re-evaluate the patient tomorrow, that is 07/10/2014.     ____________________________ Wallace Cullens. Franchot Mimes, MD skc:at D: 07/09/2014 16:14:11 ET T: 07/09/2014 18:28:43 ET JOB#: [id number]  cc: Arlyn Leak K. Franchot Mimes, MD, <Dictator> Dewain Penning MD ELECTRONICALLY SIGNED 07/10/2014 17:37

## 2014-09-11 NOTE — H&P (Signed)
PATIENT NAME:  [name], [name] MR#:  [id number] DATE OF BIRTH:  09-19-1950  DATE OF ADMISSION:  07/11/2014  IDENTIFYING INFORMATION: The patient is a 64 year old divorced Caucasian male from [place], [place].   CHIEF COMPLAINT: "I don't know".   HISTORY OF PRESENT ILLNESS:  This patient was brought into our Emergency Department on 07/09/2014. At that point in time he was interviewed by the intake specialty.  The patient appeared confused and disoriented. His chief complaint was "I was looking around for sign for a school play. I was doing some rock climbing and I went over the wall.." This patient has been in our Emergency Department a multitude of times. He has a history of benzodiazepine misuse and alcohol dependance.  Apparently, he was brought in by EMS to our Emergency Department after his parents reported he was acting in a bizarre manner and was disoriented. Based on my evaluation today, the patient is an unreliable historian. He is not sure as to where he is right now but he knew the date and he knew the month and the year. He tells me he lives in a group home and that he has been there for 5 years, but then later on he went on to say that he lives in an apartment by himself. He denies today having any suicidality, homicidality, or auditory or visual hallucinations. He denies any depression. Denies major problems with sleep, appetite, energy, or concentration. He does complain of feeling confused and "shaky" and is requesting to be restarted on the Xanax he was taking at home. The patient states that he is prescribed Xanax on unknown dose which he takes as needed but at least he takes 1 tablet a day.  The patient also tells me that he has been drinking about a pint of whiskey per week. He states that in the past he used marijuana but denies using any illicit substances recently. He does smoke cigarettes about 1 pack a day. He denies any history of trauma as an adult or as a child.    PAST PSYCHIATRIC HISTORY: Per records the patient has had multiple visits to our Emergency Department. He was involved in our intensive outpatient substance abuse program in 2014. He has a history of alcohol dependence. Also per chart at some point in time he received a diagnosis of bipolar disorder.  I am unclear as to where he received his psychiatric care. Per a prior consultation from December he reported being seen by [facility] for his medical problems and this is where he received Xanax and he has seeing a PA there. Apparently he had an appointment to go there yesterday. Per the medication reconciliation, it says the patient is receiving alprazolam 1 mg p.o. b.i.d. and citalopram 40 mg a day. The patient reports only 1 hospitalization here and denies any history of suicidal attempts.   PAST MEDICAL HISTORY: He reports having issues with hypertension and seizures. The patient is currently prescribed Norvasc 5 mg daily and Keppra 500 mg b.i.d. per the medication reconciliation.   FAMILY HISTORY: Noncontributory. He denies having any pertinent family history.   SOCIAL HISTORY: The patient states he is disabled but the reasons are unknown. He states that he has been receiving disability for 6-7 years. He is divorced and does not have any children.  He currently denies having any legal history.   ALLERGIES: No known drug allergies.  REVIEW OF SYSTEMS: The patient complains of tremors and confusion. The rest of the 10-point review of  systems is negative.   MENTAL STATUS EXAMINATION: The patient is a 64 year old Caucasian male who appears his stated age. He appears tremulous. He was lying in bed wearing hospital scrubs. Hygiene and grooming are fair. Psychomotor activity appears retarded. His eye contact was poor as the patient was lying in bed with the eyes closed. His speech had a regular tone, volume, and rate, but he was minimally spontaneous. Thought content: Negative for suicidality,  homicidality, or psychosis. Thought process appears logical, however, the patient were only provide unreliable and vague answers. His mood appears anxious and his affect is congruent. Insight and judgment is limited. Attention and concentration are impaired as the patient is very confused and at multiple times the questions have to be repeated as he will not grasp them. Fund of knowledge appears to be below average at this point in time.    PHYSICAL EXAMINATION:   GENERAL: The patient is a 64 year old Caucasian male who appears his stated age, displays fair grooming and hygiene and appears to be in mild distress as he is tremulous.  VITAL SIGNS: Blood pressure 129/84, respirations 20, pulse 67, temperature 99.  MUSCULOSKELETAL: Normal gait, normal muscular tone and positive for tremors on both upper extremities.    LABORATORY RESULTS: On 07/09/2014 the patient had a creatinine of 1.37 and a BUN of 19, Sodium 140, potassium 3.8, calcium was 9. Alcohol was below detection limit. Ammonia level was 18. AST was 53, ALT 46. TSH 195. Urine toxicology was negative. WBC is 10.8, hemoglobin 18.9, hematocrit 56.6, platelet count 204,000. Urinalysis was negative. Acetaminophen level was less than 2 and salicylate level was 1.8.   DIAGNOSES:   1.  Unspecified depressive disorder (depressive episode with insufficient symptoms). F 32.8.  2.  Benzodiazepine use disorder, severe benzodiazepine withdrawal.  3.  Alcohol use disorder, severe; alcohol withdrawal.  4.  Tobacco use disorder, severe.  5.  Seizure disorder. 6.  Hypertension.   ASSESSMENT:  The patient is 64 years old and is tremulous and confused this morning. The patient has a long history of alcohol dependence and benzodiazepine misuse and has had multiple presentations in our hospital and Emergency Department under similar circumstances. At this point I feel that the patient is going through withdrawal due to running out of alprazolam.   PLAN: For  depressive symptoms he will be continued on citalopram 40 mg p.o. daily. For insomnia he will be continued on trazodone 100 mg p.o. at bedtime. For alcohol benzodiazepine withdrawal he will be started on Librium 25 mg every 6 hours.  CIWAs will be checked every 8 hours. Vital signs will be checked every 8 hours and orders will be given him for Ativan 2 mg p.o. q. 8 hours p.r.n., which will be given for CIWA score greater than 15. For tobacco use disorder the patient has declined any nicotine replacement.  For seizure disorder, he will be continued on Keppra 500 mg b.i.d. For hypertension, he will be continued on Norvasc 5 mg p.o. daily.   DIET: His diet will be changed to soft diet, easy to chew. The patient does not have dentures with him.    HOSPITALIZATION STATUS: Will be changed to voluntary once the patient is more able to consent. We will not do it today as the patient appears confused.  LABORATORIES FOR TOMORROW:  I will check TSH, comprehensive metabolic panel, and CBC on 07/13/2014.    ____________________________ Hildred Priest, MD ahg:mc D: 07/12/2014 12:20:00 ET T: 07/12/2014 12:36:32 ET JOB#: [id number]  cc: Hildred Priest,  MD, <Dictator> Rhodia Albright MD ELECTRONICALLY SIGNED 07/20/2014 14:26

## 2014-10-04 ENCOUNTER — Encounter: Payer: Self-pay | Admitting: Emergency Medicine

## 2014-10-04 ENCOUNTER — Emergency Department: Payer: Medicare Other

## 2014-10-04 ENCOUNTER — Emergency Department
Admission: EM | Admit: 2014-10-04 | Discharge: 2014-10-05 | Disposition: A | Discharge status: Home / Self Care | Payer: Medicare Other | Attending: Emergency Medicine | Admitting: Emergency Medicine

## 2014-10-04 DIAGNOSIS — M549 Dorsalgia, unspecified: Secondary | ICD-10-CM | POA: Insufficient documentation

## 2014-10-04 DIAGNOSIS — Z720 Tobacco use: Secondary | ICD-10-CM | POA: Insufficient documentation

## 2014-10-04 DIAGNOSIS — F13239 Sedative, hypnotic or anxiolytic dependence with withdrawal, unspecified: Secondary | ICD-10-CM | POA: Diagnosis present

## 2014-10-04 DIAGNOSIS — F1310 Sedative, hypnotic or anxiolytic abuse, uncomplicated: Secondary | ICD-10-CM

## 2014-10-04 DIAGNOSIS — I10 Essential (primary) hypertension: Secondary | ICD-10-CM | POA: Diagnosis not present

## 2014-10-04 DIAGNOSIS — R51 Headache: Secondary | ICD-10-CM | POA: Insufficient documentation

## 2014-10-04 DIAGNOSIS — F13939 Sedative, hypnotic or anxiolytic use, unspecified with withdrawal, unspecified: Secondary | ICD-10-CM | POA: Diagnosis present

## 2014-10-04 DIAGNOSIS — H538 Other visual disturbances: Secondary | ICD-10-CM | POA: Diagnosis not present

## 2014-10-04 DIAGNOSIS — G40909 Epilepsy, unspecified, not intractable, without status epilepticus: Secondary | ICD-10-CM

## 2014-10-04 DIAGNOSIS — Z79899 Other long term (current) drug therapy: Secondary | ICD-10-CM | POA: Insufficient documentation

## 2014-10-04 DIAGNOSIS — F1320 Sedative, hypnotic or anxiolytic dependence, uncomplicated: Secondary | ICD-10-CM | POA: Diagnosis not present

## 2014-10-04 DIAGNOSIS — F1010 Alcohol abuse, uncomplicated: Secondary | ICD-10-CM | POA: Diagnosis present

## 2014-10-04 DIAGNOSIS — R109 Unspecified abdominal pain: Secondary | ICD-10-CM | POA: Insufficient documentation

## 2014-10-04 DIAGNOSIS — R443 Hallucinations, unspecified: Secondary | ICD-10-CM | POA: Diagnosis not present

## 2014-10-04 DIAGNOSIS — R569 Unspecified convulsions: Secondary | ICD-10-CM

## 2014-10-04 DIAGNOSIS — F23 Brief psychotic disorder: Secondary | ICD-10-CM | POA: Diagnosis not present

## 2014-10-04 DIAGNOSIS — F22 Delusional disorders: Secondary | ICD-10-CM | POA: Diagnosis not present

## 2014-10-04 LAB — URINALYSIS COMPLETE WITH MICROSCOPIC (ARMC ONLY)
Bacteria, UA: NONE SEEN
Glucose, UA: NEGATIVE mg/dL
Hgb urine dipstick: NEGATIVE
Leukocytes, UA: NEGATIVE
NITRITE: NEGATIVE
Protein, ur: NEGATIVE mg/dL
SPECIFIC GRAVITY, URINE: 1.028 (ref 1.005–1.030)
Squamous Epithelial / LPF: NONE SEEN
pH: 5 (ref 5.0–8.0)

## 2014-10-04 LAB — CBC WITH DIFFERENTIAL/PLATELET
BASOS PCT: 0 %
Basophils Absolute: 0 10*3/uL (ref 0–0.1)
Eosinophils Absolute: 0.1 10*3/uL (ref 0–0.7)
Eosinophils Relative: 0 %
HCT: 53.9 % — ABNORMAL HIGH (ref 40.0–52.0)
HEMOGLOBIN: 17.9 g/dL (ref 13.0–18.0)
LYMPHS PCT: 13 %
Lymphs Abs: 1.6 10*3/uL (ref 1.0–3.6)
MCH: 31 pg (ref 26.0–34.0)
MCHC: 33.3 g/dL (ref 32.0–36.0)
MCV: 93.1 fL (ref 80.0–100.0)
MONO ABS: 1.1 10*3/uL — AB (ref 0.2–1.0)
Monocytes Relative: 9 %
Neutro Abs: 9.7 10*3/uL — ABNORMAL HIGH (ref 1.4–6.5)
Neutrophils Relative %: 78 %
Platelets: 210 10*3/uL (ref 150–440)
RBC: 5.79 MIL/uL (ref 4.40–5.90)
RDW: 14.5 % (ref 11.5–14.5)
WBC: 12.5 10*3/uL — AB (ref 3.8–10.6)

## 2014-10-04 LAB — BASIC METABOLIC PANEL
Anion gap: 13 (ref 5–15)
BUN: 19 mg/dL (ref 6–20)
CALCIUM: 9.4 mg/dL (ref 8.9–10.3)
CHLORIDE: 103 mmol/L (ref 101–111)
CO2: 23 mmol/L (ref 22–32)
Creatinine, Ser: 1.02 mg/dL (ref 0.61–1.24)
GFR calc Af Amer: >60 mL/min (ref >60)
GFR calc non Af Amer: >60 mL/min (ref >60)
Glucose, Bld: 129 mg/dL — ABNORMAL HIGH (ref 65–99)
Potassium: 4 mmol/L (ref 3.5–5.1)
Sodium: 139 mmol/L (ref 135–145)

## 2014-10-04 LAB — ACETAMINOPHEN LEVEL: Acetaminophen (Tylenol), Serum: <10 ug/mL — ABNORMAL LOW (ref 10–30)

## 2014-10-04 LAB — URINE DRUG SCREEN, QUALITATIVE (ARMC ONLY)
AMPHETAMINES, UR SCREEN: NOT DETECTED
Barbiturates, Ur Screen: NOT DETECTED
Benzodiazepine, Ur Scrn: POSITIVE — AB
Cannabinoid 50 Ng, Ur ~~LOC~~: NOT DETECTED
Cocaine Metabolite,Ur ~~LOC~~: NOT DETECTED
MDMA (Ecstasy)Ur Screen: NOT DETECTED
METHADONE SCREEN, URINE: NOT DETECTED
Opiate, Ur Screen: NOT DETECTED
PHENCYCLIDINE (PCP) UR S: NOT DETECTED
Tricyclic, Ur Screen: NOT DETECTED

## 2014-10-04 LAB — SALICYLATE LEVEL: Salicylate Lvl: <4 mg/dL (ref 2.8–30.0)

## 2014-10-04 LAB — TSH: TSH: 2.093 u[IU]/mL (ref 0.350–4.500)

## 2014-10-04 MED ORDER — AMLODIPINE BESYLATE 5 MG PO TABS
ORAL_TABLET | ORAL | Status: AC
  Filled 2014-10-04: qty 1

## 2014-10-04 MED ORDER — LORAZEPAM 2 MG/ML IJ SOLN
INTRAMUSCULAR | Status: AC
  Administered 2014-10-04: 1 mg via INTRAVENOUS
  Filled 2014-10-04: qty 1

## 2014-10-04 MED ORDER — LORAZEPAM 1 MG PO TABS
1.0000 mg | ORAL_TABLET | Freq: Once | ORAL | Status: AC
  Administered 2014-10-04: 1 mg via ORAL

## 2014-10-04 MED ORDER — LEVETIRACETAM 500 MG PO TABS
500.0000 mg | ORAL_TABLET | Freq: Two times a day (BID) | ORAL | Status: DC
  Administered 2014-10-04 – 2014-10-05 (×3): 500 mg via ORAL

## 2014-10-04 MED ORDER — HALOPERIDOL LACTATE 5 MG/ML IJ SOLN
5.0000 mg | Freq: Once | INTRAMUSCULAR | Status: AC
  Administered 2014-10-04: 5 mg via INTRAVENOUS

## 2014-10-04 MED ORDER — LEVETIRACETAM 500 MG PO TABS
ORAL_TABLET | ORAL | Status: AC
  Administered 2014-10-04: 500 mg via ORAL
  Filled 2014-10-04: qty 1

## 2014-10-04 MED ORDER — HALOPERIDOL LACTATE 5 MG/ML IJ SOLN
INTRAMUSCULAR | Status: AC
  Filled 2014-10-04: qty 1

## 2014-10-04 MED ORDER — LORAZEPAM 1 MG PO TABS
ORAL_TABLET | ORAL | Status: AC
  Administered 2014-10-04: 1 mg via ORAL
  Filled 2014-10-04: qty 1

## 2014-10-04 MED ORDER — AMLODIPINE BESYLATE 5 MG PO TABS
5.0000 mg | ORAL_TABLET | Freq: Every day | ORAL | Status: DC
  Administered 2014-10-04 – 2014-10-05 (×2): 5 mg via ORAL

## 2014-10-04 MED ORDER — LORAZEPAM 2 MG/ML IJ SOLN
1.0000 mg | Freq: Once | INTRAMUSCULAR | Status: AC
  Administered 2014-10-04: 1 mg via INTRAVENOUS

## 2014-10-04 NOTE — ED Notes (Signed)
BEHAVIORAL HEALTH ROUNDING Patient sleeping: No. Patient alert and oriented: oriented to self Behavior appropriate: Yes.  ; If no, describe:  Nutrition and fluids offered: Yes  Toileting and hygiene offered: Yes  Sitter present: no Law enforcement present: Yes  and ODS

## 2014-10-04 NOTE — ED Notes (Signed)
Pt resting on stretcher in exam room with no distress, no c/o reported; will continue to monitor

## 2014-10-04 NOTE — ED Notes (Signed)
Pt sitting up on side of stretcher in exam room with no distress noted, no c/o voiced

## 2014-10-04 NOTE — Consult Note (Signed)
[facility] Consult   Reason for Consult:  This is a consult on this 64 year old man voluntarily in the emergency room who presents with confusion and request for detox Referring Physician:  goodman Patient Identification: [name] MRN:  [id number] Principal Diagnosis: <principal problem not specified> Diagnosis:   Patient Active Problem List   Diagnosis Date Noted  . Seizures [R56.9] 11/29/2013  . Benzodiazepine withdrawal [F13.239] 11/29/2013  . Seizure disorder [G40.909] 11/29/2013  . Alcohol abuse [F10.10] 11/29/2013  . Hypertension [I10] 11/29/2013  . Hepatitis C [B19.20] 11/29/2013  . Abnormal chest x-ray [R93.8] 11/29/2013  . Obesity [E66.9] 11/29/2013  . Tobacco use disorder [Z72.0] 11/29/2013    Total Time spent with patient: 1 hour  Subjective:   [name] is a 64 y.o. male patient admitted with patient's chief complaint "I need to get off this Xanax". He is requesting detox. Does not complain of any suicidal ideation.Marland Kitchen  HPI:  Patient says that he called 911 himself to come into the emergency room. He wants to stop using Xanax. Says that he's been taking Xanax 2 mg twice a day. Claims that he last had any 2 days ago. At first he told me that he just wanted to stop it for its own sake but eventually admitted that he had been cut off by [facility] medical. I checked the controlled substance database and there is no evidence that he's been prescribed any Xanax in the last 6 months so he is probably getting it illegally. He also says he takes oxycodone regularly but there is no evidence of that. Patient says his mood is feeling anxious and jittery but denies feeling depressed. He says he's been feeling sick for the last couple days. He can't remember whether he's had a seizure and can't remember the last time he had a seizure. He says he has been compliant with his other medicine including his Keppra and Norvasc. No particular stress  other than running out of Xanax. He says he's been having hallucinations and claims he is having visual and auditory hallucinations in the room today. He says he hasn't had any alcohol in about 6 weeks.  Past psychiatric history most notable for a long history of drug and alcohol abuse primarily alcohol and benzodiazepines. Patient has been to the emergency room many times over the years. He has had brief admissions. There is no history of any actual suicidality in the past. No history of violence. Patient appears to abuse prescription drugs that he gets off the street. He has been treated I believe in the past for anxiety symptoms but it's not clear whether he has a specific anxiety complaint.  Social history: The patient lives alone in an assisted living apartment. His parents are still alive and he depends on them for a lot of social support. Doesn't get out of the house very much. Few friends little activity.  Medical history notable for history of seizures which she says have been present since he was in his 16s. No known cause. He is currently taking Keppra 500 mg twice a day. It is unclear to me how closely related these are to his substance withdrawal.  Also has high blood pressure  Family history is negative for any mental illness or substance abuse.  Current medicines Keppra 500 mg twice a day Norvasc 5 mg per day  Substance abuse history: Patient has a history of seizures anyway so it's unclear whether any seizures he's had have been related to  alcohol or drug withdrawal. He has had episodes of getting confused at least transiently when he stopped using drugs.   HPI Elements:   Quality:  Severe anxiety and confusion. Severity:  Moderate to severe and impairing function. Timing:  Present for the last 2 days and happening after discontinuation of drugs and alcohol. Duration:  Currently going on for at least 2 days. Context:  Context is apparently discontinuing Xanax use and  discontinuing alcohol use. No known psychosocial stresses out of the ordinary.  Past Medical History:  Past Medical History  Diagnosis Date  . Hypertension   . Chronic pain in right foot   . Seizures 2010  . Benzodiazepine dependence   . Benzodiazepine withdrawal   . Alcohol abuse   . Hepatitis C     Past Surgical History  Procedure Laterality Date  . Lithotripsy    . Hemorrhoid surgery     Family History: No family history on file. Social History:  History  Alcohol Use  . Yes     History  Drug Use No    History   Social History  . Marital Status: Divorced    Spouse Name: N/A  . Number of Children: N/A  . Years of Education: N/A   Social History Main Topics  . Smoking status: Current Every Day Smoker -- 1.00 packs/day    Types: Cigarettes  . Smokeless tobacco: Not on file  . Alcohol Use: Yes  . Drug Use: No  . Sexual Activity: Not on file   Other Topics Concern  . None   Social History Narrative   Additional Social History:    History of alcohol / drug use?: Yes Name of Substance 1: Xanax 1 - Age of First Use: 24 1 - Amount (size/oz): 2 mg 1 - Frequency: Daily 1 - Last Use / Amount: 10/03/2014                   Allergies:   Allergies  Allergen Reactions  . Opana [Oxymorphone Hcl]     Made him BLACKOUT  . Sulfur     Childhood reaction     Labs:  Results for orders placed or performed during the hospital encounter of 10/04/14 (from the past 48 hour(s))  CBC with Differential     Status: Abnormal   Collection Time: 10/04/14  2:34 AM  Result Value Ref Range   WBC 12.5 (H) 3.8 - 10.6 K/uL   RBC 5.79 4.40 - 5.90 MIL/uL   Hemoglobin 17.9 13.0 - 18.0 g/dL   HCT 53.9 (H) 40.0 - 52.0 %   MCV 93.1 80.0 - 100.0 fL   MCH 31.0 26.0 - 34.0 pg   MCHC 33.3 32.0 - 36.0 g/dL   RDW 14.5 11.5 - 14.5 %   Platelets 210 150 - 440 K/uL   Neutrophils Relative % 78 %   Neutro Abs 9.7 (H) 1.4 - 6.5 K/uL   Lymphocytes Relative 13 %   Lymphs Abs 1.6 1.0 -  3.6 K/uL   Monocytes Relative 9 %   Monocytes Absolute 1.1 (H) 0.2 - 1.0 K/uL   Eosinophils Relative 0 %   Eosinophils Absolute 0.1 0 - 0.7 K/uL   Basophils Relative 0 %   Basophils Absolute 0.0 0 - 0.1 K/uL  Basic metabolic panel     Status: Abnormal   Collection Time: 10/04/14  2:34 AM  Result Value Ref Range   Sodium 139 135 - 145 mmol/L   Potassium 4.0 3.5 - 5.1 mmol/L  Chloride 103 101 - 111 mmol/L   CO2 23 22 - 32 mmol/L   Glucose, Bld 129 (H) 65 - 99 mg/dL   BUN 19 6 - 20 mg/dL   Creatinine, Ser 1.02 0.61 - 1.24 mg/dL   Calcium 9.4 8.9 - 10.3 mg/dL   GFR calc non Af Amer >60 >60 mL/min   GFR calc Af Amer >60 >60 mL/min    Comment: (NOTE) The eGFR has been calculated using the CKD EPI equation. This calculation has not been validated in all clinical situations. eGFR's persistently <60 mL/min signify possible Chronic Kidney Disease.    Anion gap 13 5 - 15  Acetaminophen level     Status: Abnormal   Collection Time: 10/04/14  2:34 AM  Result Value Ref Range   Acetaminophen (Tylenol), Serum <10 (L) 10 - 30 ug/mL    Comment:        THERAPEUTIC CONCENTRATIONS VARY SIGNIFICANTLY. A RANGE OF 10-30 ug/mL MAY BE AN EFFECTIVE CONCENTRATION FOR MANY PATIENTS. HOWEVER, SOME ARE BEST TREATED AT CONCENTRATIONS OUTSIDE THIS RANGE. ACETAMINOPHEN CONCENTRATIONS >150 ug/mL AT 4 HOURS AFTER INGESTION AND >50 ug/mL AT 12 HOURS AFTER INGESTION ARE OFTEN ASSOCIATED WITH TOXIC REACTIONS.   Salicylate level     Status: None   Collection Time: 10/04/14  2:34 AM  Result Value Ref Range   Salicylate Lvl <6.0 2.8 - 30.0 mg/dL  TSH     Status: None   Collection Time: 10/04/14  2:34 AM  Result Value Ref Range   TSH 2.093 0.350 - 4.500 uIU/mL  Urine Drug Screen, Qualitative [facility])     Status: Abnormal   Collection Time: 10/04/14  7:00 AM  Result Value Ref Range   Tricyclic, Ur Screen NONE DETECTED NONE DETECTED   Amphetamines, Ur Screen NONE DETECTED NONE DETECTED   MDMA  (Ecstasy)Ur Screen NONE DETECTED NONE DETECTED   Cocaine Metabolite,Ur [place] NONE DETECTED NONE DETECTED   Opiate, Ur Screen NONE DETECTED NONE DETECTED   Phencyclidine (PCP) Ur S NONE DETECTED NONE DETECTED   Cannabinoid 50 Ng, Ur [place] NONE DETECTED NONE DETECTED   Barbiturates, Ur Screen NONE DETECTED NONE DETECTED   Benzodiazepine, Ur Scrn POSITIVE (A) NONE DETECTED   Methadone Scn, Ur NONE DETECTED NONE DETECTED    Comment: (NOTE) 737  Tricyclics, urine               Cutoff 1000 ng/mL 200  Amphetamines, urine             Cutoff 1000 ng/mL 300  MDMA (Ecstasy), urine           Cutoff 500 ng/mL 400  Cocaine Metabolite, urine       Cutoff 300 ng/mL 500  Opiate, urine                   Cutoff 300 ng/mL 600  Phencyclidine (PCP), urine      Cutoff 25 ng/mL 700  Cannabinoid, urine              Cutoff 50 ng/mL 800  Barbiturates, urine             Cutoff 200 ng/mL 900  Benzodiazepine, urine           Cutoff 200 ng/mL 1000 Methadone, urine                Cutoff 300 ng/mL 1100 1200 The urine drug screen provides only a preliminary, unconfirmed 1300 analytical test result and should not be used for non-medical 1400  purposes. Clinical consideration and professional judgment should 1500 be applied to any positive drug screen result due to possible 1600 interfering substances. A more specific alternate chemical method 1700 must be used in order to obtain a confirmed analytical result.  1800 Gas chromato graphy / mass spectrometry (GC/MS) is the preferred 1900 confirmatory method.   Urinalysis complete, with microscopic [place])     Status: Abnormal   Collection Time: 10/04/14  7:00 AM  Result Value Ref Range   Color, Urine AMBER (A) YELLOW   APPearance CLEAR (A) CLEAR   Glucose, UA NEGATIVE NEGATIVE mg/dL   Bilirubin Urine 2+ (A) NEGATIVE   Ketones, ur 1+ (A) NEGATIVE mg/dL   Specific Gravity, Urine 1.028 1.005 - 1.030   Hgb urine dipstick NEGATIVE NEGATIVE   pH 5.0 5.0 - 8.0   Protein, ur  NEGATIVE NEGATIVE mg/dL   Nitrite NEGATIVE NEGATIVE   Leukocytes, UA NEGATIVE NEGATIVE   RBC / HPF 0-5 0 - 5 RBC/hpf   WBC, UA 6-30 0 - 5 WBC/hpf   Bacteria, UA NONE SEEN NONE SEEN   Squamous Epithelial / LPF NONE SEEN NONE SEEN   Mucous PRESENT     Vitals: Blood pressure 139/81, pulse 86, temperature 99 F (37.2 C), temperature source Oral, resp. rate 18, height 5' 10"  (1.778 m), weight 88.451 kg (195 lb), SpO2 97 %.  Risk to Self: Suicidal Ideation: No Suicidal Intent: No Is patient at risk for suicide?: No Suicidal Plan?: No What has been your use of drugs/alcohol within the last 12 months?: Xanax Other Self Harm Risks: None Intentional Self Injurious Behavior: None Risk to Others: Homicidal Ideation: No Thoughts of Harm to Others: No Current Homicidal Intent: No Current Homicidal Plan: No Access to Homicidal Means: No Criminal Charges Pending?: No Does patient have a court date: No Prior Inpatient Therapy:   Prior Outpatient Therapy:    Current Facility-Administered Medications  Medication Dose Route Frequency Provider Last Rate Last Dose  . amLODipine (NORVASC) tablet 5 mg  5 mg Oral Daily Gonzella Lex, MD      . levETIRAcetam (KEPPRA) tablet 500 mg  500 mg Oral BID Gonzella Lex, MD       Current Outpatient Prescriptions  Medication Sig Dispense Refill  . ALPRAZolam (XANAX) 1 MG tablet Take 1 mg by mouth 2 (two) times daily.     Marland Kitchen amLODipine (NORVASC) 5 MG tablet Take 5 mg by mouth daily.    Marland Kitchen etodolac (LODINE) 500 MG tablet Take 500 mg by mouth 2 (two) times daily.    . traZODone (DESYREL) 150 MG tablet Take 150 mg by mouth at bedtime.    . chlordiazePOXIDE (LIBRIUM) 10 MG capsule Take 1 pill three times a day for day 1.  Take 1 pill two times a day for day 2. Take 1 pill daily once for day 3.   Then OFF. (Patient not taking: Reported on 10/04/2014) 6 capsule 0  . levETIRAcetam (KEPPRA) 500 MG tablet Take 500 mg by mouth 2 (two) times daily.    . Multiple  Vitamin (MULTIVITAMIN WITH MINERALS) TABS tablet Take 1 tablet by mouth daily.    Marland Kitchen oxyCODONE-acetaminophen (PERCOCET/ROXICET) 5-325 MG per tablet Take 1 tablet by mouth every 6 (six) hours as needed for moderate pain.       Musculoskeletal: Strength & Muscle Tone: spastic Gait & Station: ataxic Patient leans: N/A  Psychiatric Specialty Exam: Physical Exam  Constitutional: He appears well-developed and well-nourished.  HENT:  Head: Normocephalic and atraumatic.  Eyes: Conjunctivae are normal. Pupils are equal, round, and reactive to light.  Neck: Normal range of motion.  Cardiovascular: Normal heart sounds.   Respiratory: Effort normal.  GI: Soft.  Musculoskeletal: Normal range of motion.  Neurological: He is alert.  Skin: Skin is warm. He is diaphoretic.  Psychiatric: His mood appears anxious. His affect is blunt. His speech is delayed and tangential. He is actively hallucinating. Thought content is delusional. Thought content is not paranoid. Cognition and memory are impaired. He expresses impulsivity. He expresses no homicidal and no suicidal ideation. He exhibits abnormal recent memory.    Review of Systems  Constitutional: Positive for malaise/fatigue and diaphoresis.  HENT: Negative.   Eyes: Negative.   Respiratory: Negative.   Cardiovascular: Negative.   Gastrointestinal: Negative.   Musculoskeletal: Negative.   Skin: Negative.   Neurological: Positive for tremors, seizures and weakness.  Psychiatric/Behavioral: Positive for hallucinations, memory loss and substance abuse. Negative for depression and suicidal ideas. The patient is nervous/anxious and has insomnia.     Blood pressure 139/81, pulse 86, temperature 99 F (37.2 C), temperature source Oral, resp. rate 18, height 5' 10"  (1.778 m), weight 88.451 kg (195 lb), SpO2 97 %.Body mass index is 27.98 kg/(m^2).  General Appearance: Disheveled  Eye Contact::  Minimal  Speech:  Slow and Slurred  Volume:  Decreased   Mood:  Anxious  Affect:  Non-Congruent and Flat  Thought Process:  Disorganized, Loose and Tangential  Orientation:  Other:  A she cooperated with me in appropriate manner but then told me that he believed that he was in a race car. He was able to tell me the year but nothing about the date.  Thought Content:  Hallucinations: Auditory Visual  Suicidal Thoughts:  No  Homicidal Thoughts:  No  Memory:  Immediate;   Fair Recent;   Poor Remote;   Fair  Judgement:  Impaired  Insight:  Shallow  Psychomotor Activity:  Mannerisms and Tremor  Concentration:  Poor  Recall:  Poor  Fund of Knowledge:Poor  Language: Poor  Akathisia:  No  Handed:  Right  AIMS (if indicated):     Assets:  Desire for Improvement Housing Social Support  ADL's:  Impaired  Cognition: Impaired,  Moderate  Sleep:      Medical Decision Making: Review of Psycho-Social Stressors (1), Review or order clinical lab tests (1), Established Problem, Worsening (2), Review or order medicine tests (1), Review of Medication Regimen & Side Effects (2) and Review of New Medication or Change in Dosage (2)  Treatment Plan Summary: Plan Patient with an established history of substance abuse presents with complaints of withdrawal from benzodiazepines and possibly from alcohol. His mental state appears to be confused with some hallucinations. It's possible that some of this may be exaggerated. Patient is denying suicidal ideation and is not acutely threatening agitated or suicidal. Due to his diagnosis and mental state he is not appropriate for admission to the psychiatry ward. I have restarted him on his Keppra and Norvasc. I have asked the social work staff in the psychiatry unit here in the emergency room to try referring him to residential treatment services or to the alcohol and drug abuse treatment center. If these fail and he requires medical admission we may need to refer that to the medical staff. Continue observation meanwhile.  Continue medication as needed for withdrawal.  Plan:  Patient does not meet criteria for psychiatric inpatient admission. Supportive therapy provided about ongoing stressors. Discussed crisis plan, support from social  network, calling 911, coming to the Emergency Department, and calling Suicide Hotline. Disposition: Treating him in the emergency room and working on possible referral to a substance abuse treatment facility  Alethia Berthold 10/04/2014 11:30 AM

## 2014-10-04 NOTE — ED Notes (Addendum)
BEHAVIORAL HEALTH ROUNDING Patient sleeping: Yes.   Patient alert and oriented: not applicable- pt sleeping, woke up to ask if he wanted to eat. Ate a little bit of his sandwich and fell back asleep. Behavior appropriate: Yes.  ; If no, describe: n/a Nutrition and fluids offered: Yes  Toileting and hygiene offered: Yes  Sitter present: no Law enforcement present: Yes

## 2014-10-04 NOTE — ED Notes (Signed)

## 2014-10-04 NOTE — ED Notes (Signed)
Received call from lab reporting hemolyzed AND clotted specimens. Requested that lab send phlebotomist to stick pt for new specimens as pt has been stuck multiple times by ED staff. Lab to send representative.

## 2014-10-04 NOTE — ED Notes (Signed)

## 2014-10-04 NOTE — ED Notes (Signed)
Went to check in on pt, pt to be found pacing the room, restless. Picking up thermometer, attempting to take temperature. Appears to be responding to internal stimuli. Talking back to the people talking on the TV. Pt states "I'm having a hard time with my nerves". MD made aware and order received for ativan 1mg  IVP.

## 2014-10-04 NOTE — ED Notes (Addendum)
BEHAVIORAL HEALTH ROUNDING Patient sleeping: No Patient alert and oriented: yes Behavior appropriate: No, obviously hallucinating and responding to internal stimuli. ; If no, describe: n/a Nutrition and fluids offered: No Toileting and hygiene offered: No Sitter present: no Law enforcement present: Yes

## 2014-10-04 NOTE — ED Notes (Signed)

## 2014-10-04 NOTE — ED Notes (Signed)
Dr. Weber Cooks at bedside speaking with patient.

## 2014-10-04 NOTE — ED Notes (Signed)
BEHAVIORAL HEALTH ROUNDING Patient sleeping: Yes.   Patient alert and oriented: not applicable Behavior appropriate: Yes.  ; If no, describe: n/a Nutrition and fluids offered: No Toileting and hygiene offered: No Sitter present: not applicable Law enforcement present: Yes

## 2014-10-04 NOTE — ED Provider Notes (Signed)
[facility] Emergency Department Provider Note  ____________________________________________  Time seen: Approximately 1:33 AM  I have reviewed the triage vital signs and the nursing notes.   HISTORY  Chief Complaint Hallucinations    HPI Delrick Dehart IV is a 64 y.o. male who reports that he is in a commercial for buying a distillery. The patient reports that he is under a doctor's care and [place] and reports it is for his well-being. The patient reports that he was sent here by his parents who thought he needed observation. 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 He also reports that he has to be important. The patient denies any thoughts of hurting himself or anyone else. Per EMS the patient is out of his Xanax and has not taken Xanax in 2 days. The patient does seem to be confused and is unsure as to where he is. The patient reports initially that he is in a casino and asked me to place a bed for him and then he reports that he is in an ambulance on his way to the psychiatric unit.   Past Medical History  Diagnosis Date  . Hypertension   . Chronic pain in right foot   . Seizures 2010  . Benzodiazepine dependence   . Benzodiazepine withdrawal   . Alcohol abuse   . Hepatitis C     Patient Active Problem List   Diagnosis Date Noted  . Seizures 11/29/2013  . Benzodiazepine withdrawal 11/29/2013  . Seizure disorder 11/29/2013  . Alcohol abuse 11/29/2013  . Hypertension 11/29/2013  . Hepatitis C 11/29/2013  . Abnormal chest x-ray 11/29/2013  . Obesity 11/29/2013  . Tobacco use disorder 11/29/2013    Past Surgical History  Procedure Laterality Date  . Lithotripsy    . Hemorrhoid surgery      Current Outpatient Rx  Name  Route  Sig  Dispense  Refill  . ALPRAZolam (XANAX) 1 MG tablet   Oral   Take 1 mg by mouth 3 (three) times daily as needed for anxiety.          Marland Kitchen amLODipine  (NORVASC) 5 MG tablet   Oral   Take 5 mg by mouth daily.         . chlordiazePOXIDE (LIBRIUM) 10 MG capsule      Take 1 pill three times a day for day 1.  Take 1 pill two times a day for day 2. Take 1 pill daily once for day 3.   Then OFF.   6 capsule   0   . levETIRAcetam (KEPPRA) 500 MG tablet   Oral   Take 500 mg by mouth 2 (two) times daily.         . Multiple Vitamin (MULTIVITAMIN WITH MINERALS) TABS tablet   Oral   Take 1 tablet by mouth daily.         Marland Kitchen oxyCODONE-acetaminophen (PERCOCET/ROXICET) 5-325 MG per tablet   Oral   Take 1 tablet by mouth every 6 (six) hours as needed for moderate pain.            Allergies Opana and Sulfur  No family history on file.  Social History History  Substance Use Topics  . Smoking status: Current Every Day Smoker -- 1.00 packs/day    Types: Cigarettes  . Smokeless tobacco: Not on file  . Alcohol Use: Yes    Review of Systems Constitutional: No fever/chills Eyes:  Blurred vision ENT: No sore throat. Cardiovascular: Denies chest pain. Respiratory: Denies shortness of breath. Gastrointestinal: Mild abdominal pain with no nausea or vomiting. Genitourinary: Negative for dysuria. Musculoskeletal:  back pain. Skin: Negative for rash. Neurological: Headache Psychiatric:Hallucinations and delusions  10-point ROS otherwise negative.  ____________________________________________   PHYSICAL EXAM:  VITAL SIGNS: ED Triage Vitals  Enc Vitals Group     BP 10/04/14 0128 149/88 mmHg     Pulse Rate 10/04/14 0128 95     Resp 10/04/14 0128 18     Temp 10/04/14 0128 98.5 F (36.9 C)     Temp Source 10/04/14 0128 Oral     SpO2 10/04/14 0128 96 %     Weight 10/04/14 0128 195 lb (88.451 kg)     Height 10/04/14 0128 5\' 10"  (1.778 m)     Head Cir --      Peak Flow --      Pain Score --      Pain Loc --      Pain Edu? --      Excl. in [place]? --     Constitutional: Alert and oriented to time or place. No significant  distress Eyes: Conjunctivae are normal. PERRL. EOMI. Head: Atraumatic. Nose: No congestion/rhinnorhea. Mouth/Throat: Mucous membranes are moist.  Oropharynx non-erythematous. Cardiovascular: Normal rate, regular rhythm. Grossly normal heart sounds.  Good peripheral circulation. Respiratory: Normal respiratory effort.  No retractions. Lungs CTAB. Gastrointestinal: Soft and nontender. No distention. Positive bowel sounds Genitourinary: Deferred Musculoskeletal: No lower extremity tenderness nor edema.   Neurologic:  Normal speech and language. No gross focal neurologic deficits are appreciated.  Skin:  Skin is warm, dry and intact. No rash noted. Psychiatric: Delusions and hallucinations  ____________________________________________   LABS (all labs ordered are listed, but only abnormal results are displayed)  Labs Reviewed  URINE DRUG SCREEN, QUALITATIVE ([place]) - Abnormal; Notable for the following:    Benzodiazepine, Ur Scrn POSITIVE (*)    All other components within normal limits  URINALYSIS COMPLETEWITH MICROSCOPIC ([facility])  - Abnormal; Notable for the following:    Color, Urine AMBER (*)    APPearance CLEAR (*)    Bilirubin Urine 2+ (*)    Ketones, ur 1+ (*)    All other components within normal limits  CBC WITH DIFFERENTIAL/PLATELET - Abnormal; Notable for the following:    WBC 12.5 (*)    HCT 53.9 (*)    Neutro Abs 9.7 (*)    Monocytes Absolute 1.1 (*)    All other components within normal limits  BASIC METABOLIC PANEL - Abnormal; Notable for the following:    Glucose, Bld 129 (*)    All other components within normal limits  ACETAMINOPHEN LEVEL - Abnormal; Notable for the following:    Acetaminophen (Tylenol), Serum <10 (*)    All other components within normal limits  SALICYLATE LEVEL  TSH   ____________________________________________  EKG  ED ECG REPORT I, Loney Hering, the attending physician, personally viewed and interpreted this ECG.   Date:  10/04/2014  EKG Time: 223  Rate: 90  Rhythm: normal sinus rhythm  Axis: Left axis  Intervals:none  ST&T Change: None  ____________________________________________  RADIOLOGY  CT head: No other findings of atrophy and microvascular ischemic disease without acute intracranial process ____________________________________________   PROCEDURES  Procedure(s) performed: None  Critical Care performed: No  ____________________________________________   INITIAL IMPRESSION / ASSESSMENT AND PLAN / ED COURSE  Pertinent labs & imaging results that were available during my care of  the patient were reviewed by me and considered in my medical decision making (see chart for details).  The patient is a 64 year old male who comes in today with some hallucinations and confusion. The patient per EMS has been off of the Xanax and does have a history of benzodiazepine withdrawal. The patient also has a history of seizures. I will perform some blood work as well as CT of the patient's head to ensure that this was not a possible lesion that is causing his symptoms. I will give the patient a dose of Ativan 1 mg by mouth 1 to help with possible benzodiazepine withdrawal. ____________________________________________  The patient has a history of bipolar disorder. We will have him evaluated by psych.   FINAL CLINICAL IMPRESSION(S) / ED DIAGNOSES  Delusions and hallucinations    Loney Hering, MD 10/04/14 [phone]

## 2014-10-04 NOTE — BH Assessment (Signed)
Assessment Note  [name] IV is an 64 y.o. male. He states to the TTS that he came to the ED due to stress and his need for detox. He reports that he is not depressed nor anxious.  He states that he does not have auditory or visual hallucinations. He denied homicidal and suicidal ideation and intent.  He states that he is stressed and that he wants to de detoxed from Xanax.  He states that he takes 2mg  of Xanax daily.  It is reported that he stated to other ED staff that he is depressed and was seeking assistance for depression.  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 Axis II: Deferred Axis III:  Past Medical History  Diagnosis Date   Hypertension    Chronic pain in right foot    Seizures 2010   Benzodiazepine dependence    Benzodiazepine withdrawal    Alcohol abuse    Hepatitis C    Axis IV: other psychosocial or environmental problems Axis V: 51-60 moderate symptoms  Past Medical History:  Past Medical History  Diagnosis Date   Hypertension    Chronic pain in right foot    Seizures 2010   Benzodiazepine dependence    Benzodiazepine withdrawal    Alcohol abuse    Hepatitis C     Past Surgical History  Procedure Laterality Date   Lithotripsy     Hemorrhoid surgery      Family History: No family history on file.  Social History:  reports that he has been smoking Cigarettes.  He has been smoking about 1.00 pack per day. He does not have any smokeless tobacco history on file. He reports that he drinks alcohol. He reports that he does not use illicit drugs.  Additional Social History:  Alcohol / Drug Use History of alcohol / drug use?: Yes Substance #1 Name of Substance 1: Xanax 1 - Age of First Use: 24 1 - Amount (size/oz): 2 mg 1 - Frequency: Daily 1 - Last Use / Amount: 10/03/2014  CIWA: CIWA-Ar BP: (!) 149/88 mmHg Pulse Rate: 95 COWS:    Allergies:  Allergies  Allergen Reactions   Opana [Oxymorphone Hcl]    Made him BLACKOUT   Sulfur     Childhood reaction     Home Medications:  (Not in a hospital admission)  OB/GYN Status:  No LMP for male patient.  General Assessment Data Location of Assessment: [facility] ED TTS Assessment: In system Is this a Tele or Face-to-Face Assessment?: Face-to-Face Is this an Initial Assessment or a Re-assessment for this encounter?: Initial Assessment Marital status: Divorced Living Arrangements: Alone Can pt return to current living arrangement?: Yes Admission Status: Voluntary Is patient capable of signing voluntary admission?: No Referral Source: MD Insurance type: Medicare     Crisis Care Plan Living Arrangements: Alone Name of Psychiatrist: None Name of Therapist: None  Education Status Is patient currently in school?: No  Risk to self with the past 6 months Suicidal Ideation: No Has patient been a risk to self within the past 6 months prior to admission? : No Suicidal Intent: No Has patient had any suicidal intent within the past 6 months prior to admission? : No Is patient at risk for suicide?: No Suicidal Plan?: No Has patient had any suicidal plan within the past 6 months prior to admission? : No What has been your use of drugs/alcohol within the last 12 months?: Xanax Previous Attempts/Gestures: No Other Self Harm Risks: None Intentional Self Injurious  Behavior: None Family Suicide History: No Recent stressful life event(s):  (Substance abuse) Persecutory voices/beliefs?: No Depression: No Depression Symptoms:  (Denied) Substance abuse history and/or treatment for substance abuse?: Yes  Risk to Others within the past 6 months Homicidal Ideation: No Does patient have any lifetime risk of violence toward others beyond the six months prior to admission? : No Thoughts of Harm to Others: No Current Homicidal Intent: No Current Homicidal Plan: No Access to Homicidal Means: No Criminal Charges Pending?: No Does patient have a court  date: No Is patient on probation?: No  Psychosis Hallucinations: None noted  Mental Status Report Appearance/Hygiene: In scrubs Eye Contact: Poor Motor Activity: Tics Speech:  (stutter) Level of Consciousness: Quiet/awake Mood: Sullen Affect: Flat Anxiety Level: None Thought Processes: Coherent Orientation: Person, Place, Situation, Time                      Abuse/Neglect Assessment (Assessment to be complete while patient is alone) Physical Abuse: Denies Verbal Abuse: Denies Sexual Abuse: Denies Exploitation of patient/patient's resources: Denies Self-Neglect: Denies     Regulatory affairs officer (For Healthcare) Does patient have an advance directive?: No Would patient like information on creating an advanced directive?: Yes Higher education careers adviser given          Disposition:  Disposition Initial Assessment Completed for this Encounter: Yes Disposition of Patient: Referred to (TO be assessed by the psychiatrist)  On Site Evaluation by:   Reviewed with Physician:    Guerry Minors 10/04/2014 7:07 AM

## 2014-10-04 NOTE — ED Notes (Signed)
BEHAVIORAL HEALTH ROUNDING Patient sleeping: No. Patient alert and oriented: yes Behavior appropriate: Yes.  ; If no, describe: n/a Nutrition and fluids offered: Yes  Toileting and hygiene offered: Yes  Sitter present: not applicable Law enforcement present: yes

## 2014-10-04 NOTE — ED Notes (Signed)
Mother [name] requesting to be called prior to pt being transferred to other facility. Number is [phone].

## 2014-10-04 NOTE — ED Notes (Signed)
BEHAVIORAL HEALTH ROUNDING Patient sleeping: NO Patient alert and oriented: YES Behavior appropriate: YES Nutrition and fluids offered: YES Toileting and hygiene offered: YES Sitter present: YES Law enforcement present: YES 

## 2014-10-04 NOTE — ED Notes (Signed)
Pt found to still be wandering around room. Pt attempting to pull out drawers in room and sit down. Came out of room stating he needed to leave. Easily redirectable. Spoke to Dr. Archie Balboa, orders received for haldol 5 mg IVP.

## 2014-10-04 NOTE — ED Notes (Signed)
BEHAVIORAL HEALTH ROUNDING Patient sleeping: Yes.   Patient alert and oriented: yes Behavior appropriate: Yes.  ; If no, describe: n/a Nutrition and fluids offered: No Toileting and hygiene offered: No Sitter present: no Law enforcement present: Yes

## 2014-10-05 DIAGNOSIS — F1320 Sedative, hypnotic or anxiolytic dependence, uncomplicated: Secondary | ICD-10-CM | POA: Diagnosis not present

## 2014-10-05 MED ORDER — LEVETIRACETAM 500 MG PO TABS
ORAL_TABLET | ORAL | Status: AC
  Administered 2014-10-05: 500 mg via ORAL
  Filled 2014-10-05: qty 1

## 2014-10-05 MED ORDER — AMLODIPINE BESYLATE 5 MG PO TABS
ORAL_TABLET | ORAL | Status: AC
  Administered 2014-10-05: 5 mg via ORAL
  Filled 2014-10-05: qty 1

## 2014-10-05 NOTE — ED Notes (Signed)

## 2014-10-05 NOTE — ED Notes (Signed)
PIV R AC removed. Catheter intact.

## 2014-10-05 NOTE — Discharge Instructions (Signed)
You were evaluated for Xanax abuse. You do not appear to be in acute withdrawal here in the emergency department during your observation here. Return to the emergency department for any new or worsening condition including tremors, headache, blurry vision, weakness, numbness, hallucinations, altered mental status, chest pain, seizure, depression, thoughts of wanting to hurt herself or others, or any other symptoms concerning to you. You need to follow up for an outpatient treatment plan with either your primary care doctor, or the [facility] as a primary care physician, or [facility]. You were given contact information for the [facility] by Kerry Dory the nurse here in the emergency department.  Finding Treatment for Alcohol and Drug Addiction It can be hard to find the right place to get professional treatment. Here are some important things to consider:  There are different types of treatment to choose from.  Some programs are live-in (residential) while others are not (outpatient). Sometimes a combination is offered.  No single type of program is right for everyone.  Most treatment programs involve a combination of education, counseling, and a 12-step, spiritually-based approach.  There are non-spiritually based programs (not 12-step).  Some treatment programs are government sponsored. They are geared for patients without private insurance.  Treatment programs can vary in many respects such as:  Cost and types of insurance accepted.  Types of on-site medical services offered.  Length of stay, setting, and size.  Overall philosophy of treatment. A person may need specialized treatment or have needs not addressed by all programs. For example, adolescents need treatment appropriate for their age. Other people have secondary disorders that must be managed as well. Secondary conditions can include mental illness, such as depression or diabetes. Often, a period of detoxification from  alcohol or drugs is needed. This requires medical supervision and not all programs offer this. THINGS TO CONSIDER WHEN SELECTING A TREATMENT PROGRAM   Is the program certified by the appropriate government agency? Even private programs must be certified and employ certified professionals.  Does the program accept your insurance? If not, can a payment plan be set up?  Is the facility clean, organized, and well run? Do they allow you to speak with graduates who can share their treatment experience with you? Can you tour the facility? Can you meet with staff?  Does the program meet the full range of individual needs?  Does the treatment program address sexual orientation and physical disabilities? Do they provide age, gender, and culturally appropriate treatment services?  Is treatment available in languages other than English?  Is long-term aftercare support or guidance encouraged and provided?  Is assessment of an individual's treatment plan ongoing to ensure it meets changing needs?  Does the program use strategies to encourage reluctant patients to remain in treatment long enough to increase the likelihood of success?  Does the program offer counseling (individual or group) and other behavioral therapies?  Does the program offer medicine as part of the treatment regimen, if needed?  Is there ongoing monitoring of possible relapse? Is there a defined relapse prevention program? Are services or referrals offered to family members to ensure they understand addiction and the recovery process? This would help them support the recovering individual.  Are 12-step meetings held at the center or is transport available for patients to attend outside meetings? In countries outside of the [place] and [place], [employer], [employer] for contact information for services in your area. Document Released: 03/28/2005 Document Revised: 07/22/2011 Document Reviewed: 10/08/2007 ExitCare Patient  Information  2015 [place], [place]. This information is not intended to replace advice given to you by your health care provider. Make sure you discuss any questions you have with your health care provider.  Benzodiazepine Withdrawal  Benzodiazepines are a group of drugs that are prescribed for both short-term and long-term treatment of a variety of medical conditions. For some of these conditions, such as seizures and sudden and severe muscle spasms, they are used only for a few hours or a few days. For other conditions, such as anxiety, sleep problems, or frequent muscle spasms or to help prevent seizures, they are used for an extended period, usually weeks or months. Benzodiazepines work by changing the way your brain functions. Normally, chemicals in your brain called neurotransmitters send messages between your brain cells. The neurotransmitter that benzodiazepines affect is called gamma-aminobutyric acid (GABA). GABA sends out messages that have a calming effect on many of the functions of your brain. Benzodiazepines make these messages stronger and increase this calming effect. Short-term use of benzodiazepines usually does not cause problems when you stop taking the drugs. However, if you take benzodiazepines for a long time, your body can adjust to the drug and require more of it to produce the same effect (drug tolerance). Eventually, you can develop physical dependence on benzodiazepines, which is when you experience negative effects if your dosage of benzodiazepines is reduced or stopped too quickly. These negative effects are called symptoms of withdrawal. SYMPTOMS Symptoms of withdrawal may begin anytime within the first 10 days after you stop taking the benzodiazepine. They can last from several weeks up to a few months but usually are the worst between the first 10 to 14 days.  The actual symptoms also vary, depending on the type of benzodiazepine you take. Possible symptoms  include:  Anxiety.  Excitability.  Irritability.  Depression.  Mood swings.  Trouble sleeping.  Confusion.  Uncontrollable shaking (tremors).  Muscle weakness.  Seizures. DIAGNOSIS To diagnose benzodiazepine withdrawal, your caregiver will examine you for certain signs, such as:  Rapid heartbeat.  Rapid breathing.  Tremors.  High blood pressure.  Fever.  Mood changes. Your caregiver also may ask the following questions about your use of benzodiazepines:  What type of benzodiazepine did you take?  How much did you take each day?  How long did you take the drug?  When was the last time you took the drug?  Do you take any other drugs?  Have you had alcohol recently?  Have you had a seizure recently?  Have you lost consciousness recently?  Have you had trouble remembering recent events?  Have you had a recent increase in anxiety, irritability, or trouble sleeping? A drug test also may be administered. TREATMENT The treatment for benzodiazepine withdrawal can vary, depending on the type and severity of your symptoms, what type of benzodiazepine you have been taking, and how long you have been taking the benzodiazepine. Sometimes it is necessary for you to be treated in a hospital, especially if you are at risk of seizures.  Often, treatment includes a prescription for a long-acting benzodiazepine, the dosage of which is reduced slowly over a long period. This period could be several weeks or months. Eventually, your dosage will be reduced to a point that you can stop taking the drug, without experiencing withdrawal symptoms. This is called tapered withdrawal. Occasionally, minor symptoms of withdrawal continue for a few days or weeks after you have completed a tapered withdrawal. SEEK IMMEDIATE MEDICAL CARE IF:  You  have a seizure.  You develop a craving for drugs or alcohol.  You begin to experience symptoms of withdrawal during your tapered  withdrawal.  You become very confused.  You lose consciousness.  You have trouble breathing.  You think about hurting yourself or someone else. Document Released: 04/18/2011 Document Revised: 07/22/2011 Document Reviewed: 04/18/2011 [facility] Patient Information 2015 [place], [place]. This information is not intended to replace advice given to you by your health care provider. Make sure you discuss any questions you have with your health care provider.

## 2014-10-05 NOTE — ED Notes (Signed)

## 2014-10-05 NOTE — ED Provider Notes (Addendum)
-----------------------------------------   9:47 AM on 10/05/2014 -----------------------------------------  I accepted care from Dr. Owens Shark overnight. I reviewed vitals this morning and they're stable.  Patient himself is in no acute distress, no hallucinations, no tremor, no hypertension or tachycardia. He's not appear to be any acute benzodiazepine withdrawal. I've discussed what symptoms to watch and return for.  I reviewed Dr. Weber Cooks note from yesterday stating he needed no inpatient psychiatric care, and referred for outpatient substance abuse treatment. Patient was referred to [facility] however he does not have insurance coverage for this treatment center. Calvin with behavioral medicine will talk about other options with the patient and give me an update.  ----------------------------------------- 10:03 AM on 10/05/2014 -----------------------------------------  Kerry Dory provided the patient with contact information for the [facility]. If he does not want to go to the [facility], he can follow up with his primary care physician and the [facility] was referred as a primary care doctor if the patient does not have one.  Follow-up instructions and return precautions were discussed with the patient prior to discharge.  Lisa Roca, MD 10/05/14 [id number]  Lisa Roca, MD 10/05/14 1004

## 2014-10-05 NOTE — ED Notes (Signed)
Patient resting with eyes closed, lights in room and TV off for patient comfort. Respirations even and unlabored. NAD noted at this time. Will continue to monitor.

## 2014-10-05 NOTE — [facility] Counselor (Signed)
Spoke with pt. And reviewed with him his options for SA treatment.  Discussed pt. with ER MD (Dr. Julio Alm) and , pt. is able to d/c home.  Pt. Was giving information and instructions on how to follow up with Referral([facility].

## 2014-10-05 NOTE — ED Notes (Signed)
BEHAVIORAL HEALTH ROUNDING Patient sleeping: No. Patient alert and oriented: yes Behavior appropriate: Yes.  ; If no, describe:  Nutrition and fluids offered: Yes  Pt provided with ice water. Toileting and hygiene offered: Yes  Sitter present: no Event organiser present: Yes

## 2014-10-05 NOTE — ED Notes (Signed)
Patient resting with eyes closed, lights and TV turned off for patient comfort. Respirations even and unlabored. NAD noted. Will continue to monitor.

## 2014-10-05 NOTE — ED Notes (Signed)
Patient resting comfortably in bed, lights and TV turned off for patient comfort. Respirations even and unlabored. NAD at this time.

## 2014-10-18 DIAGNOSIS — M150 Primary generalized (osteo)arthritis: Secondary | ICD-10-CM | POA: Diagnosis not present

## 2014-10-18 DIAGNOSIS — I10 Essential (primary) hypertension: Secondary | ICD-10-CM | POA: Diagnosis not present

## 2014-10-18 DIAGNOSIS — G4700 Insomnia, unspecified: Secondary | ICD-10-CM | POA: Diagnosis not present

## 2014-10-18 DIAGNOSIS — R300 Dysuria: Secondary | ICD-10-CM | POA: Diagnosis not present

## 2014-10-18 DIAGNOSIS — F339 Major depressive disorder, recurrent, unspecified: Secondary | ICD-10-CM | POA: Diagnosis not present

## 2014-10-18 DIAGNOSIS — G40909 Epilepsy, unspecified, not intractable, without status epilepticus: Secondary | ICD-10-CM | POA: Diagnosis not present

## 2014-10-18 DIAGNOSIS — Z0001 Encounter for general adult medical examination with abnormal findings: Secondary | ICD-10-CM | POA: Diagnosis not present

## 2014-12-07 ENCOUNTER — Other Ambulatory Visit: Payer: Self-pay

## 2014-12-07 ENCOUNTER — Emergency Department
Admission: EM | Admit: 2014-12-07 | Discharge: 2014-12-07 | Disposition: A | Discharge status: Home / Self Care | Payer: Medicare Other | Attending: Emergency Medicine | Admitting: Emergency Medicine

## 2014-12-07 DIAGNOSIS — Z791 Long term (current) use of non-steroidal anti-inflammatories (NSAID): Secondary | ICD-10-CM | POA: Diagnosis not present

## 2014-12-07 DIAGNOSIS — R5383 Other fatigue: Secondary | ICD-10-CM | POA: Diagnosis not present

## 2014-12-07 DIAGNOSIS — I10 Essential (primary) hypertension: Secondary | ICD-10-CM | POA: Diagnosis not present

## 2014-12-07 DIAGNOSIS — E860 Dehydration: Secondary | ICD-10-CM | POA: Insufficient documentation

## 2014-12-07 DIAGNOSIS — R4182 Altered mental status, unspecified: Secondary | ICD-10-CM | POA: Diagnosis not present

## 2014-12-07 DIAGNOSIS — Z79899 Other long term (current) drug therapy: Secondary | ICD-10-CM | POA: Diagnosis not present

## 2014-12-07 DIAGNOSIS — R569 Unspecified convulsions: Secondary | ICD-10-CM | POA: Diagnosis not present

## 2014-12-07 DIAGNOSIS — R42 Dizziness and giddiness: Secondary | ICD-10-CM | POA: Diagnosis not present

## 2014-12-07 DIAGNOSIS — Z720 Tobacco use: Secondary | ICD-10-CM | POA: Diagnosis not present

## 2014-12-07 DIAGNOSIS — R55 Syncope and collapse: Secondary | ICD-10-CM | POA: Diagnosis present

## 2014-12-07 DIAGNOSIS — M6281 Muscle weakness (generalized): Secondary | ICD-10-CM | POA: Diagnosis not present

## 2014-12-07 LAB — CBC WITH DIFFERENTIAL/PLATELET
Basophils Absolute: 0 10*3/uL (ref 0–0.1)
Basophils Relative: 1 %
EOS ABS: 0.2 10*3/uL (ref 0–0.7)
Eosinophils Relative: 2 %
HCT: 48 % (ref 40.0–52.0)
HEMOGLOBIN: 16.1 g/dL (ref 13.0–18.0)
Lymphocytes Relative: 22 %
Lymphs Abs: 1.6 10*3/uL (ref 1.0–3.6)
MCH: 31.5 pg (ref 26.0–34.0)
MCHC: 33.6 g/dL (ref 32.0–36.0)
MCV: 93.9 fL (ref 80.0–100.0)
Monocytes Absolute: 0.7 10*3/uL (ref 0.2–1.0)
Monocytes Relative: 9 %
NEUTROS PCT: 66 %
Neutro Abs: 4.8 10*3/uL (ref 1.4–6.5)
PLATELETS: 128 10*3/uL — AB (ref 150–440)
RBC: 5.11 MIL/uL (ref 4.40–5.90)
RDW: 16.1 % — AB (ref 11.5–14.5)
WBC: 7.3 10*3/uL (ref 3.8–10.6)

## 2014-12-07 LAB — BASIC METABOLIC PANEL
Anion gap: 9 (ref 5–15)
BUN: 11 mg/dL (ref 6–20)
CALCIUM: 9.3 mg/dL (ref 8.9–10.3)
CO2: 29 mmol/L (ref 22–32)
CREATININE: 1.05 mg/dL (ref 0.61–1.24)
Chloride: 103 mmol/L (ref 101–111)
Glucose, Bld: 118 mg/dL — ABNORMAL HIGH (ref 65–99)
POTASSIUM: 4.1 mmol/L (ref 3.5–5.1)
Sodium: 141 mmol/L (ref 135–145)

## 2014-12-07 LAB — TROPONIN I: Troponin I: <0.03 ng/mL (ref <0.031)

## 2014-12-07 MED ORDER — SODIUM CHLORIDE 0.9 % IV BOLUS (SEPSIS)
1000.0000 mL | Freq: Once | INTRAVENOUS | Status: AC
Start: 2014-12-07 — End: 2014-12-07
  Administered 2014-12-07: 1000 mL via INTRAVENOUS

## 2014-12-07 NOTE — ED Provider Notes (Signed)
[facility] Emergency Department Provider Note    ____________________________________________  Time seen: 2020  I have reviewed the triage vital signs and the nursing notes.   HISTORY  Chief Complaint Got hot, syncope  History limited by: Not Limited   HPI [name] is a 64 y.o. male who presents to the emergency department today because of concerns of syncope and getting hot and today's weather. Patient states that he started getting hot today when he walked to the store. He started feeling funny and somewhat lightheaded. He states he does not recall passing out but does remember waking up about a block from his house with paramedics name. He denies any chest pain or palpitations. He denies any fevers. He states he feels much better now.   Past Medical History  Diagnosis Date  . Hypertension   . Chronic pain in right foot   . Seizures 2010  . Benzodiazepine dependence   . Benzodiazepine withdrawal   . Alcohol abuse   . Hepatitis C     Patient Active Problem List   Diagnosis Date Noted  . Seizures 11/29/2013  . Benzodiazepine withdrawal 11/29/2013  . Seizure disorder 11/29/2013  . Alcohol abuse 11/29/2013  . Hypertension 11/29/2013  . Hepatitis C 11/29/2013  . Abnormal chest x-ray 11/29/2013  . Obesity 11/29/2013  . Tobacco use disorder 11/29/2013    Past Surgical History  Procedure Laterality Date  . Lithotripsy    . Hemorrhoid surgery      Current Outpatient Rx  Name  Route  Sig  Dispense  Refill  . ALPRAZolam (XANAX) 1 MG tablet   Oral   Take 1 mg by mouth 2 (two) times daily.          Marland Kitchen amLODipine (NORVASC) 5 MG tablet   Oral   Take 5 mg by mouth daily.         . chlordiazePOXIDE (LIBRIUM) 10 MG capsule      Take 1 pill three times a day for day 1.  Take 1 pill two times a day for day 2. Take 1 pill daily once for day 3.   Then OFF. Patient not taking: Reported on 10/04/2014   6 capsule   0   .  etodolac (LODINE) 500 MG tablet   Oral   Take 500 mg by mouth 2 (two) times daily.         Marland Kitchen levETIRAcetam (KEPPRA) 500 MG tablet   Oral   Take 500 mg by mouth 2 (two) times daily.         . Multiple Vitamin (MULTIVITAMIN WITH MINERALS) TABS tablet   Oral   Take 1 tablet by mouth daily.         Marland Kitchen oxyCODONE-acetaminophen (PERCOCET/ROXICET) 5-325 MG per tablet   Oral   Take 1 tablet by mouth every 6 (six) hours as needed for moderate pain.          . traZODone (DESYREL) 150 MG tablet   Oral   Take 150 mg by mouth at bedtime.           Allergies Opana and Sulfur  No family history on file.  Social History History  Substance Use Topics  . Smoking status: Current Every Day Smoker -- 1.00 packs/day    Types: Cigarettes  . Smokeless tobacco: Not on file  . Alcohol Use: Yes    Review of Systems  Constitutional: Negative for fever. Cardiovascular: Negative for chest pain. Respiratory: Negative for shortness of breath.  Gastrointestinal: Negative for abdominal pain, vomiting and diarrhea. Genitourinary: Negative for dysuria. Musculoskeletal: Negative for back pain. Skin: Negative for rash. Neurological: Negative for headaches, focal weakness or numbness. Positive for lightheadedness   10-point ROS otherwise negative.  ____________________________________________   PHYSICAL EXAM:  VITAL SIGNS: ED Triage Vitals  Enc Vitals Group     BP 12/07/14 2008 153/94 mmHg     Pulse Rate 12/07/14 2008 108     Resp 12/07/14 2008 18     Temp 12/07/14 2008 98.6 F (37 C)     Temp Source 12/07/14 2008 Oral     SpO2 12/07/14 2008 97 %   Constitutional: Alert and oriented. Well appearing and in no distress. Eyes: Conjunctivae are normal. PERRL. Normal extraocular movements. ENT   Head: Normocephalic and atraumatic.   Nose: No congestion/rhinnorhea.   Mouth/Throat: Mucous membranes are moist.   Neck: No stridor. Hematological/Lymphatic/Immunilogical: No  cervical lymphadenopathy. Cardiovascular: Normal rate, regular rhythm.  No murmurs, rubs, or gallops. Respiratory: Normal respiratory effort without tachypnea nor retractions. Breath sounds are clear and equal bilaterally. No wheezes/rales/rhonchi. Gastrointestinal: Soft and nontender. No distention. There is no CVA tenderness. Genitourinary: Deferred Musculoskeletal: Normal range of motion in all extremities. No joint effusions.  No lower extremity tenderness nor edema. Neurologic:  Normal speech and language. No gross focal neurologic deficits are appreciated. Speech is normal.  Skin:  Skin is warm, dry and intact. No rash noted. Psychiatric: Mood and affect are normal. Speech and behavior are normal. Patient exhibits appropriate insight and judgment.  ____________________________________________    LABS (pertinent positives/negatives)  Labs Reviewed  CBC WITH DIFFERENTIAL/PLATELET - Abnormal; Notable for the following:    RDW 16.1 (*)    Platelets 128 (*)    All other components within normal limits  BASIC METABOLIC PANEL - Abnormal; Notable for the following:    Glucose, Bld 118 (*)    All other components within normal limits  TROPONIN I     ____________________________________________   EKG  I, Nance Pear, attending physician, personally viewed and interpreted this EKG  EKG Time: 2017 Rate: 98 Rhythm: NSR Axis: left axis deviation Intervals: qtc 444 QRS: narrow ST changes: no st elevation    ____________________________________________    RADIOLOGY  None  ____________________________________________   PROCEDURES  Procedure(s) performed: None  Critical Care performed: No  ____________________________________________   INITIAL IMPRESSION / ASSESSMENT AND PLAN / ED COURSE  Pertinent labs & imaging results that were available during my care of the patient were reviewed by me and considered in my medical decision making (see chart for  details).  Patient presents to the emergency department after passing out and considered for heat exhaustion. Concerning findings on physical exam. Patient mildly tachycardic initially. Patient was given West fluids which did help resolve his tachycardia. Additionally patient did feel much improved. I discussed with patient importance of hydration. Additionally discussed return precautions.  ____________________________________________   FINAL CLINICAL IMPRESSION(S) / ED DIAGNOSES  Final diagnoses:  Dehydration     Nance Pear, MD 12/07/14 2129

## 2014-12-07 NOTE — Discharge Instructions (Signed)
Please seek medical attention for any high fevers, chest pain, shortness of breath, change in behavior, persistent vomiting, bloody stool or any other new or concerning symptoms.  Dehydration, Adult Dehydration is when you lose more fluids from the body than you take in. Vital organs like the kidneys, brain, and heart cannot function without a proper amount of fluids and salt. Any loss of fluids from the body can cause dehydration.  CAUSES   Vomiting.  Diarrhea.  Excessive sweating.  Excessive urine output.  Fever. SYMPTOMS  Mild dehydration  Thirst.  Dry lips.  Slightly dry mouth. Moderate dehydration  Very dry mouth.  Sunken eyes.  Skin does not bounce back quickly when lightly pinched and released.  Dark urine and decreased urine production.  Decreased tear production.  Headache. Severe dehydration  Very dry mouth.  Extreme thirst.  Rapid, weak pulse (more than 100 beats per minute at rest).  Cold hands and feet.  Not able to sweat in spite of heat and temperature.  Rapid breathing.  Blue lips.  Confusion and lethargy.  Difficulty being awakened.  Minimal urine production.  No tears. DIAGNOSIS  Your caregiver will diagnose dehydration based on your symptoms and your exam. Blood and urine tests will help confirm the diagnosis. The diagnostic evaluation should also identify the cause of dehydration. TREATMENT  Treatment of mild or moderate dehydration can often be done at home by increasing the amount of fluids that you drink. It is best to drink small amounts of fluid more often. Drinking too much at one time can make vomiting worse. Refer to the home care instructions below. Severe dehydration needs to be treated at the hospital where you will probably be given intravenous (IV) fluids that contain water and electrolytes. HOME CARE INSTRUCTIONS   Ask your caregiver about specific rehydration instructions.  Drink enough fluids to keep your urine  clear or pale yellow.  Drink small amounts frequently if you have nausea and vomiting.  Eat as you normally do.  Avoid:  Foods or drinks high in sugar.  Carbonated drinks.  Juice.  Extremely hot or cold fluids.  Drinks with caffeine.  Fatty, greasy foods.  Alcohol.  Tobacco.  Overeating.  Gelatin desserts.  Wash your hands well to avoid spreading bacteria and viruses.  Only take over-the-counter or prescription medicines for pain, discomfort, or fever as directed by your caregiver.  Ask your caregiver if you should continue all prescribed and over-the-counter medicines.  Keep all follow-up appointments with your caregiver. SEEK MEDICAL CARE IF:  You have abdominal pain and it increases or stays in one area (localizes).  You have a rash, stiff neck, or severe headache.  You are irritable, sleepy, or difficult to awaken.  You are weak, dizzy, or extremely thirsty. SEEK IMMEDIATE MEDICAL CARE IF:   You are unable to keep fluids down or you get worse despite treatment.  You have frequent episodes of vomiting or diarrhea.  You have blood or green matter (bile) in your vomit.  You have blood in your stool or your stool looks black and tarry.  You have not urinated in 6 to 8 hours, or you have only urinated a small amount of very dark urine.  You have a fever.  You faint. MAKE SURE YOU:   Understand these instructions.  Will watch your condition.  Will get help right away if you are not doing well or get worse. Document Released: 04/29/2005 Document Revised: 07/22/2011 Document Reviewed: 12/17/2010 [facility] Patient Information 2015 [place], [place].  This information is not intended to replace advice given to you by your health care provider. Make sure you discuss any questions you have with your health care provider. ° °

## 2014-12-12 DIAGNOSIS — M150 Primary generalized (osteo)arthritis: Secondary | ICD-10-CM | POA: Diagnosis not present

## 2014-12-12 DIAGNOSIS — G4700 Insomnia, unspecified: Secondary | ICD-10-CM | POA: Diagnosis not present

## 2014-12-12 DIAGNOSIS — G40909 Epilepsy, unspecified, not intractable, without status epilepticus: Secondary | ICD-10-CM | POA: Diagnosis not present

## 2014-12-12 DIAGNOSIS — F411 Generalized anxiety disorder: Secondary | ICD-10-CM | POA: Diagnosis not present

## 2014-12-12 DIAGNOSIS — F339 Major depressive disorder, recurrent, unspecified: Secondary | ICD-10-CM | POA: Diagnosis not present

## 2015-01-05 ENCOUNTER — Ambulatory Visit: Payer: Medicare Other | Attending: Pain Medicine | Admitting: Pain Medicine

## 2015-01-05 ENCOUNTER — Encounter: Payer: Self-pay | Admitting: Pain Medicine

## 2015-01-05 VITALS — BP 146/88 | HR 74 | Temp 98.5°F | Resp 16 | Ht 70.0 in | Wt 207.0 lb

## 2015-01-05 DIAGNOSIS — F13239 Sedative, hypnotic or anxiolytic dependence with withdrawal, unspecified: Secondary | ICD-10-CM | POA: Diagnosis not present

## 2015-01-05 DIAGNOSIS — G90523 Complex regional pain syndrome I of lower limb, bilateral: Secondary | ICD-10-CM | POA: Insufficient documentation

## 2015-01-05 DIAGNOSIS — M792 Neuralgia and neuritis, unspecified: Secondary | ICD-10-CM | POA: Insufficient documentation

## 2015-01-05 DIAGNOSIS — G609 Hereditary and idiopathic neuropathy, unspecified: Secondary | ICD-10-CM | POA: Diagnosis not present

## 2015-01-05 DIAGNOSIS — F1010 Alcohol abuse, uncomplicated: Secondary | ICD-10-CM | POA: Insufficient documentation

## 2015-01-05 DIAGNOSIS — G629 Polyneuropathy, unspecified: Secondary | ICD-10-CM | POA: Diagnosis not present

## 2015-01-05 DIAGNOSIS — K759 Inflammatory liver disease, unspecified: Secondary | ICD-10-CM | POA: Diagnosis not present

## 2015-01-05 DIAGNOSIS — F419 Anxiety disorder, unspecified: Secondary | ICD-10-CM | POA: Diagnosis not present

## 2015-01-05 DIAGNOSIS — I10 Essential (primary) hypertension: Secondary | ICD-10-CM | POA: Insufficient documentation

## 2015-01-05 DIAGNOSIS — M722 Plantar fascial fibromatosis: Secondary | ICD-10-CM | POA: Diagnosis not present

## 2015-01-05 DIAGNOSIS — G40909 Epilepsy, unspecified, not intractable, without status epilepticus: Secondary | ICD-10-CM | POA: Diagnosis not present

## 2015-01-05 DIAGNOSIS — M79604 Pain in right leg: Secondary | ICD-10-CM | POA: Diagnosis present

## 2015-01-05 DIAGNOSIS — G620 Drug-induced polyneuropathy: Secondary | ICD-10-CM | POA: Insufficient documentation

## 2015-01-05 DIAGNOSIS — M79605 Pain in left leg: Secondary | ICD-10-CM | POA: Diagnosis present

## 2015-01-05 NOTE — Patient Instructions (Addendum)
Continue present medications  Lumbar sympathetic block to be performed at time return appointment  F/U PCP Dr.F Humphrey Rolls   for evaliation of  BP and general medical  condition  F/U surgical evaluation  F/U neurological evaluation  F/U psych eval as presently doing  F/U Dr. Elvina Mattes as discussed  May consider radiofrequency rhizolysis or intraspinal procedures pending response to present treatment and F/U evaluation   Patient to call [facility] should patient have concerns prior to scheduled return appointmen. GENERAL RISKS AND COMPLICATIONS  What are the risk, side effects and possible complications? Generally speaking, most procedures are safe.  However, with any procedure there are risks, side effects, and the possibility of complications.  The risks and complications are dependent upon the sites that are lesioned, or the type of nerve block to be performed.  The closer the procedure is to the spine, the more serious the risks are.  Great care is taken when placing the radio frequency needles, block needles or lesioning probes, but sometimes complications can occur. 1. Infection: Any time there is an injection through the skin, there is a risk of infection.  This is why sterile conditions are used for these blocks.  There are four possible types of infection. 1. Localized skin infection. 2. Central Nervous System Infection-This can be in the form of Meningitis, which can be deadly. 3. Epidural Infections-This can be in the form of an epidural abscess, which can cause pressure inside of the spine, causing compression of the spinal cord with subsequent paralysis. This would require an emergency surgery to decompress, and there are no guarantees that the patient would recover from the paralysis. 4. Discitis-This is an infection of the intervertebral discs.  It occurs in about 1% of discography procedures.  It is difficult to treat and it may lead to surgery.        2. Pain: the  needles have to go through skin and soft tissues, will cause soreness.       3. Damage to internal structures:  The nerves to be lesioned may be near blood vessels or    other nerves which can be potentially damaged.       4. Bleeding: Bleeding is more common if the patient is taking blood thinners such as  aspirin, Coumadin, Ticiid, Plavix, etc., or if he/she have some genetic predisposition  such as hemophilia. Bleeding into the spinal canal can cause compression of the spinal  cord with subsequent paralysis.  This would require an emergency surgery to  decompress and there are no guarantees that the patient would recover from the  paralysis.       5. Pneumothorax:  Puncturing of a lung is a possibility, every time a needle is introduced in  the area of the chest or upper back.  Pneumothorax refers to free air around the  collapsed lung(s), inside of the thoracic cavity (chest cavity).  Another two possible  complications related to a similar event would include: Hemothorax and Chylothorax.   These are variations of the Pneumothorax, where instead of air around the collapsed  lung(s), you may have blood or chyle, respectively.       6. Spinal headaches: They may occur with any procedures in the area of the spine.       7. Persistent CSF (Cerebro-Spinal Fluid) leakage: This is a rare problem, but may occur  with prolonged intrathecal or epidural catheters either due to the formation of a fistulous  track or a dural tear.  8. Nerve damage: By working so close to the spinal cord, there is always a possibility of  nerve damage, which could be as serious as a permanent spinal cord injury with  paralysis.       9. Death:  Although rare, severe deadly allergic reactions known as "Anaphylactic  reaction" can occur to any of the medications used.      10. Worsening of the symptoms:  We can always make thing worse.  What are the chances of something like this happening? Chances of any of this occuring are  extremely low.  By statistics, you have more of a chance of getting killed in a motor vehicle accident: while driving to the hospital than any of the above occurring .  Nevertheless, you should be aware that they are possibilities.  In general, it is similar to taking a shower.  Everybody knows that you can slip, hit your head and get killed.  Does that mean that you should not shower again?  Nevertheless always keep in mind that statistics do not mean anything if you happen to be on the wrong side of them.  Even if a procedure has a 1 (one) in a 1,000,000 (million) chance of going wrong, it you happen to be that one..Also, keep in mind that by statistics, you have more of a chance of having something go wrong when taking medications.  Who should not have this procedure? If you are on a blood thinning medication (e.g. Coumadin, Plavix, see list of "Blood Thinners"), or if you have an active infection going on, you should not have the procedure.  If you are taking any blood thinners, please inform your physician.  How should I prepare for this procedure?  Do not eat or drink anything at least six hours prior to the procedure.  Bring a driver with you .  It cannot be a taxi.  Come accompanied by an adult that can drive you back, and that is strong enough to help you if your legs get weak or numb from the local anesthetic.  Take all of your medicines the morning of the procedure with just enough water to swallow them.  If you have diabetes, make sure that you are scheduled to have your procedure done first thing in the morning, whenever possible.  If you have diabetes, take only half of your insulin dose and notify our nurse that you have done so as soon as you arrive at the clinic.  If you are diabetic, but only take blood sugar pills (oral hypoglycemic), then do not take them on the morning of your procedure.  You may take them after you have had the procedure.  Do not take aspirin or any  aspirin-containing medications, at least eleven (11) days prior to the procedure.  They may prolong bleeding.  Wear loose fitting clothing that may be easy to take off and that you would not mind if it got stained with Betadine or blood.  Do not wear any jewelry or perfume  Remove any nail coloring.  It will interfere with some of our monitoring equipment.  NOTE: Remember that this is not meant to be interpreted as a complete list of all possible complications.  Unforeseen problems may occur.  BLOOD THINNERS The following drugs contain aspirin or other products, which can cause increased bleeding during surgery and should not be taken for 2 weeks prior to and 1 week after surgery.  If you should need take something for relief of minor  pain, you may take acetaminophen which is found in Tylenol,m Datril, Anacin-3 and Panadol. It is not blood thinner. The products listed below are.  Do not take any of the products listed below in addition to any listed on your instruction sheet.  A.P.C or A.P.C with Codeine Codeine Phosphate Capsules #3 Ibuprofen Ridaura  ABC compound Congesprin Imuran rimadil  Advil Cope Indocin Robaxisal  Alka-Seltzer Effervescent Pain Reliever and Antacid Coricidin or Coricidin-D  Indomethacin Rufen  Alka-Seltzer plus Cold Medicine Cosprin Ketoprofen S-A-C Tablets  Anacin Analgesic Tablets or Capsules Coumadin Korlgesic Salflex  Anacin Extra Strength Analgesic tablets or capsules CP-2 Tablets Lanoril Salicylate  Anaprox Cuprimine Capsules Levenox Salocol  Anexsia-D Dalteparin Magan Salsalate  Anodynos Darvon compound Magnesium Salicylate Sine-off  Ansaid Dasin Capsules Magsal Sodium Salicylate  Anturane Depen Capsules Marnal Soma  APF Arthritis pain formula Dewitt's Pills Measurin Stanback  Argesic Dia-Gesic Meclofenamic Sulfinpyrazone  Arthritis Bayer Timed Release Aspirin Diclofenac Meclomen Sulindac  Arthritis pain formula Anacin Dicumarol Medipren Supac  Analgesic  (Safety coated) Arthralgen Diffunasal Mefanamic Suprofen  Arthritis Strength Bufferin Dihydrocodeine Mepro Compound Suprol  Arthropan liquid Dopirydamole Methcarbomol with Aspirin Synalgos  ASA tablets/Enseals Disalcid Micrainin Tagament  Ascriptin Doan's Midol Talwin  Ascriptin A/D Dolene Mobidin Tanderil  Ascriptin Extra Strength Dolobid Moblgesic Ticlid  Ascriptin with Codeine Doloprin or Doloprin with Codeine Momentum Tolectin  Asperbuf Duoprin Mono-gesic Trendar  Aspergum Duradyne Motrin or Motrin IB Triminicin  Aspirin plain, buffered or enteric coated Durasal Myochrisine Trigesic  Aspirin Suppositories Easprin Nalfon Trillsate  Aspirin with Codeine Ecotrin Regular or Extra Strength Naprosyn Uracel  Atromid-S Efficin Naproxen Ursinus  Auranofin Capsules Elmiron Neocylate Vanquish  Axotal Emagrin Norgesic Verin  Azathioprine Empirin or Empirin with Codeine Normiflo Vitamin E  Azolid Emprazil Nuprin Voltaren  Bayer Aspirin plain, buffered or children's or timed BC Tablets or powders Encaprin Orgaran Warfarin Sodium  Buff-a-Comp Enoxaparin Orudis Zorpin  Buff-a-Comp with Codeine Equegesic Os-Cal-Gesic   Buffaprin Excedrin plain, buffered or Extra Strength Oxalid   Bufferin Arthritis Strength Feldene Oxphenbutazone   Bufferin plain or Extra Strength Feldene Capsules Oxycodone with Aspirin   Bufferin with Codeine Fenoprofen Fenoprofen Pabalate or Pabalate-SF   Buffets II Flogesic Panagesic   Buffinol plain or Extra Strength Florinal or Florinal with Codeine Panwarfarin   Buf-Tabs Flurbiprofen Penicillamine   Butalbital Compound Four-way cold tablets Penicillin   Butazolidin Fragmin Pepto-Bismol   Carbenicillin Geminisyn Percodan   Carna Arthritis Reliever Geopen Persantine   Carprofen Gold's salt Persistin   Chloramphenicol Goody's Phenylbutazone   Chloromycetin Haltrain Piroxlcam   Clmetidine heparin Plaquenil   Cllnoril Hyco-pap Ponstel   Clofibrate Hydroxy chloroquine  Propoxyphen         Before stopping any of these medications, be sure to consult the physician who ordered them.  Some, such as Coumadin (Warfarin) are ordered to prevent or treat serious conditions such as "deep thrombosis", "pumonary embolisms", and other heart problems.  The amount of time that you may need off of the medication may also vary with the medication and the reason for which you were taking it.  If you are taking any of these medications, please make sure you notify your pain physician before you undergo any procedures.         Pain Management Discharge Instructions  General Discharge Instructions :  If you need to reach your doctor call: Monday-Friday 8:00 am - 4:00 pm at [phone] or toll free [phone].  After clinic hours [phone] to have operator reach doctor.  Bring all of  your medication bottles to all your appointments in the pain clinic.  To cancel or reschedule your appointment with Pain Management please remember to call 24 hours in advance to avoid a fee.  Refer to the educational materials which you have been given on: General Risks, I had my Procedure. Discharge Instructions, Post Sedation.  Post Procedure Instructions:  The drugs you were given will stay in your system until tomorrow, so for the next 24 hours you should not drive, make any legal decisions or drink any alcoholic beverages.  You may eat anything you prefer, but it is better to start with liquids then soups and crackers, and gradually work up to solid foods.  Please notify your doctor immediately if you have any unusual bleeding, trouble breathing or pain that is not related to your normal pain.  Depending on the type of procedure that was done, some parts of your body may feel week and/or numb.  This usually clears up by tonight or the next day.  Walk with the use of an assistive device or accompanied by an adult for the 24 hours.  You may use ice on the affected area for  the first 24 hours.  Put ice in a Ziploc bag and cover with a towel and place against area 15 minutes on 15 minutes off.  You may switch to heat after 24 hours.

## 2015-01-05 NOTE — Progress Notes (Signed)
Safety precautions to be maintained throughout the outpatient stay will include: orient to surroundings, keep bed in low position, maintain call bell within reach at all times, provide assistance with transfer out of bed and ambulation.  

## 2015-01-05 NOTE — Progress Notes (Signed)
Subjective:    Patient ID: Brayan Votaw, male    DOB: 07-28-1950, 64 y.o.   MRN: [id number]  HPI Patient is 64 year old gentleman who comes to pain management Center is requested Dr. Renee Rival condyle further evaluation and treatment of pain involving the lower extremity regions and lumbar region. Patient states that his pain is a burning stinging sensation of the lower extremities. Patient states that he has history of plantar fasciitis as well as neuropathy. Patient is with continued pain despite present treatment regimen. On today's visit described patient described his pain as a burning constant sensation of the lower extremities which awaken patient from sleep and interferes patient ability to go to sleep. Patient stated the pain increases with walking predominantly patient stated the pain had decreased with stretching warmth showers and medications we informed patient that we will consider patient for treatment and will consider interventional treatment at time return appointment in attempt to decrease severity of symptoms, minimize progression of symptoms, and hopefully avoid the need for more involved treatment. The patient will also follow-up with Dr. Elvina Mattes for further evaluation and assessment and treatment of his condition as discussed. The patient was with understanding and in agreement suggested treatment plan. Patient continue the care of of Dr. Renee Rival condyle and will also continue the care of psychiatrist and will continue treatment follow-up evaluations with Mrs. Sisk for psych evaluation as well was understanding and agreement status treatment plan.   Review of Systems   Cardiovascular High blood pressure  Pulmonary Unremarkable  Neurological History of seizure disorder  Psychological Anxiety Panic  attacks  Gastrointestinal Hepatitis  Genitourinary Unremarkable  Hematological Unremarkable  Endocrine Unremarkable  Rheumatological Unremarkable  Musculoskeletal Unremarkable  Other significant Unremarkable         Objective:   Physical Exam  There was tends to palpation of paraspinal muscular region cervical region cervical facet region palpation of which reproduced mild discomfort. There was mild tennis over the splenius capitis and occipitalis musculature regions. Palpation over the acromioclavicular and glenohumeral joint regions were with mild tends to palpation. Palpation over the region of the thoracic facet thoracic paraspinal musculature region reproduced mild discomfort. There was unremarkable Tinel's and Phalen's maneuver. Palpation over the lumbar facet lumbar paraspinal musculature region was with minimal tenderness to palpation. Lateral bending and rotation and extension and palpation of the lumbar facets reproduce mild to moderate discomfort. There was tenderness over the greater trochanteric region iliotibial band region a minimal degree. Leg raising was tolerated follow-up 30 without increased pain dorsiflexion noted. There appeared to be negative clonus negative Homans. No definite sensory deficit of dermatomal distribution was detected. EHL strength appeared to be decreased. There was minimal tennis over the PSIS PII S regions. Mild tinnitus of the gluteal and piriformis musculature regions were noted. Knees were without increased warmth or erythema. There appeared to be negative anterior and posterior drawer signs and no ballottement of the patella there was no apparent temperature discrepancy of the lower extremities and no new lesions of the lower extremities were noted. No definite allodynia of the lower extremities were noted. There was tends to palpation of the feet noted lateral aspect and also tends to palpation in the region between the great toe and the  first toe of the feet There was negative clonus negative Homans. Abdomen soft nontender and no costovertebral tenderness was noted.      Assessment & Plan:   Neuropathy  Plantar fasciitis  Seizure disorder  Benzodiazepine withdrawal  Alcohol abuse  Hepatitis    Plan   Continue present medications  Lumbar synthetic block to be performed at time return appointment  F/U PCP Dr.F Humphrey Rolls   for evaliation of  BP and general medical  condition  F/U surgical evaluation. Patient will follow-up with Dr. Elvina Mattes as discussed  Psych follow-up evaluation with Sisk and psychiatrist as discussed  F/U neurological evaluation Neurological evaluation with PNCV/EMG studies and other studies to be considered as discussed  May consider radiofrequency rhizolysis or intraspinal procedures pending response to present treatment and F/U evaluation   Patient to call Pain Management Center should patient have concerns prior to scheduled return appointment.

## 2015-01-12 ENCOUNTER — Other Ambulatory Visit: Payer: Self-pay | Admitting: Pain Medicine

## 2015-01-19 ENCOUNTER — Telehealth: Payer: Self-pay | Admitting: Pain Medicine

## 2015-01-19 NOTE — Telephone Encounter (Signed)
Would like to talk to someone about meds

## 2015-01-20 NOTE — Telephone Encounter (Signed)
Patient calling for pain medicines.  States that he had talked to you about possibly ordering pain medicines.  Patient has appointment on 01-30-15.  I told him that it could be discussed at his next appointment.

## 2015-01-20 NOTE — Telephone Encounter (Signed)
Spoke with Dr. Primus Bravo, he has not begun prescribing meds, have patient see PCP for this.  Patient notified.

## 2015-01-22 IMAGING — CR DG CHEST 2V
2 series · 2 of 2 positions shown · non-contrast
Comparison: None.

CLINICAL DATA: Altered mental status.  History of seizures.

EXAM:
CHEST  2 VIEW

[w chest lat]
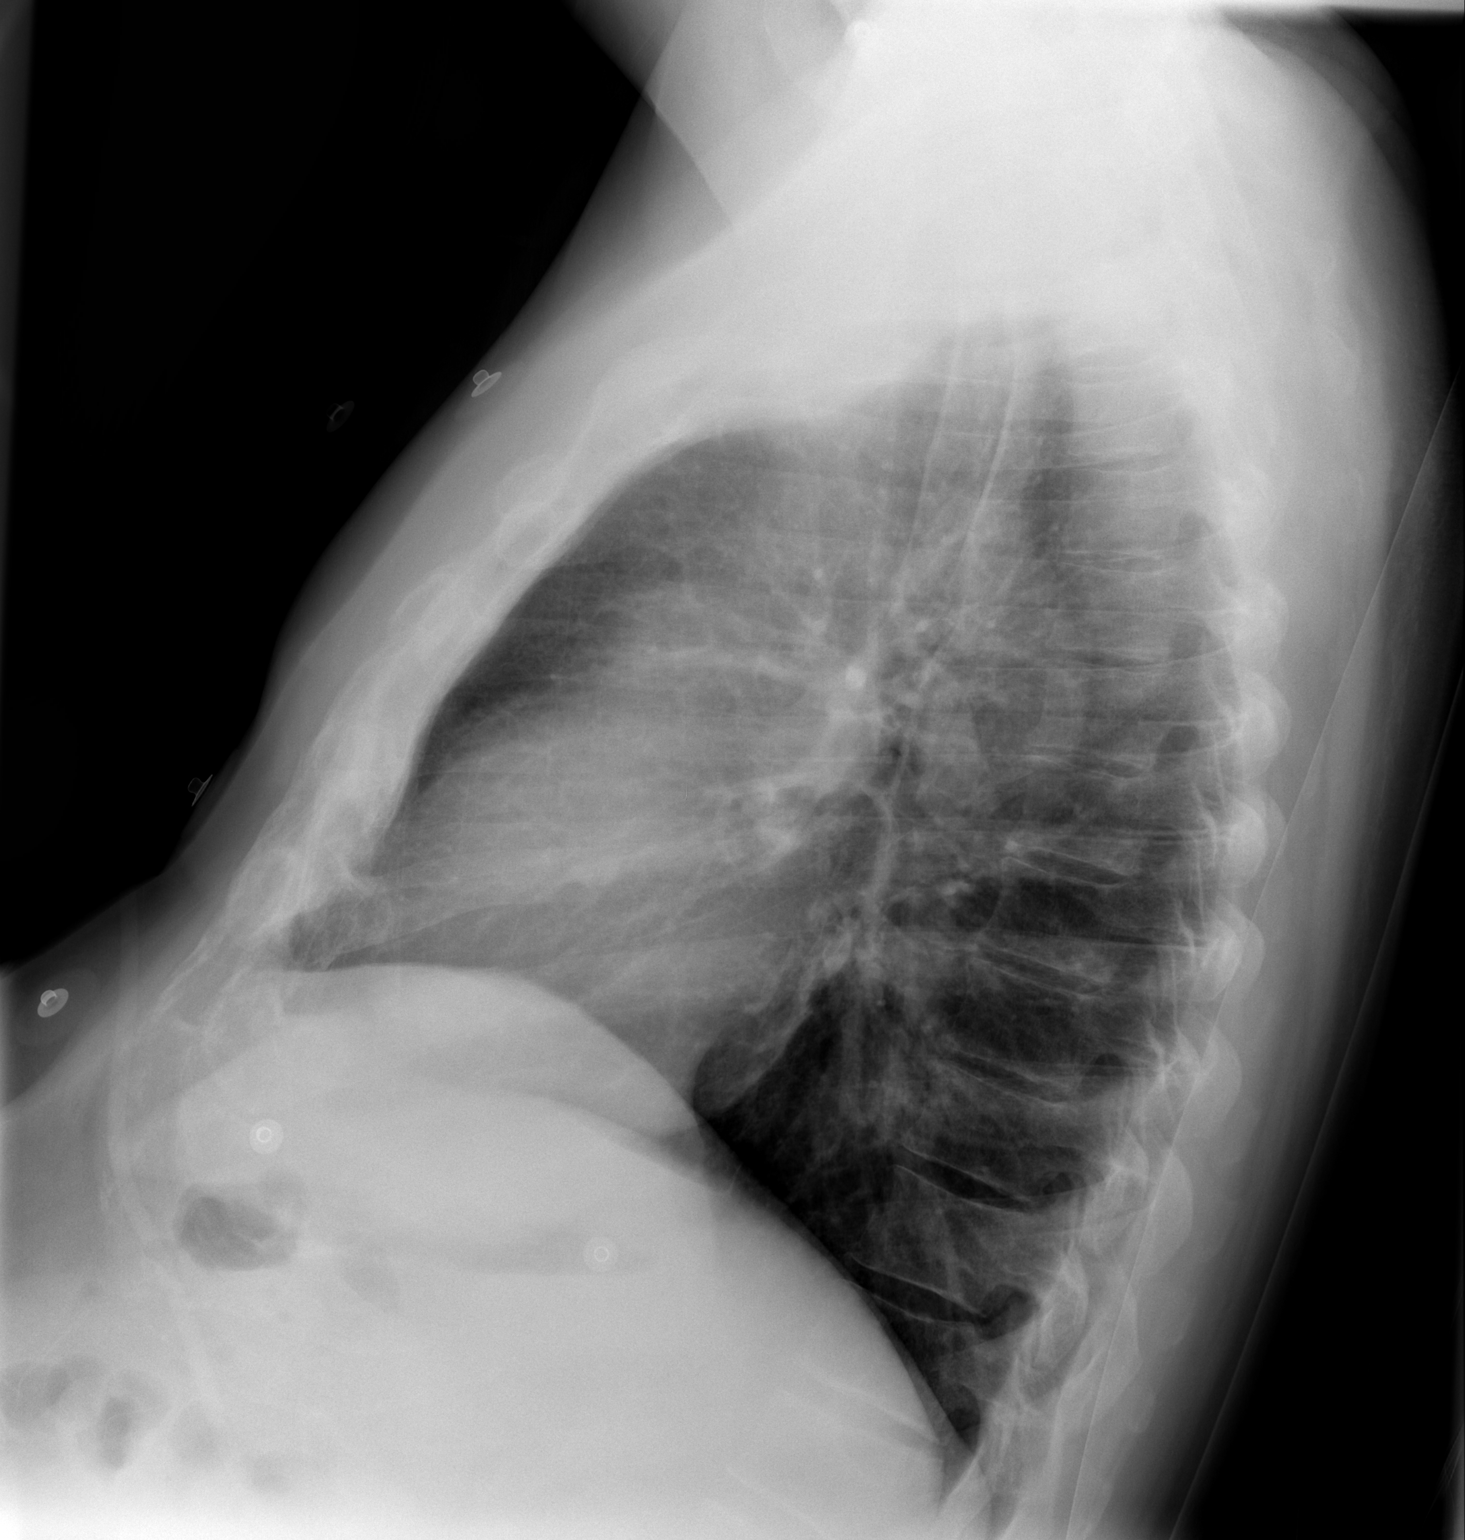

[w chest ap]
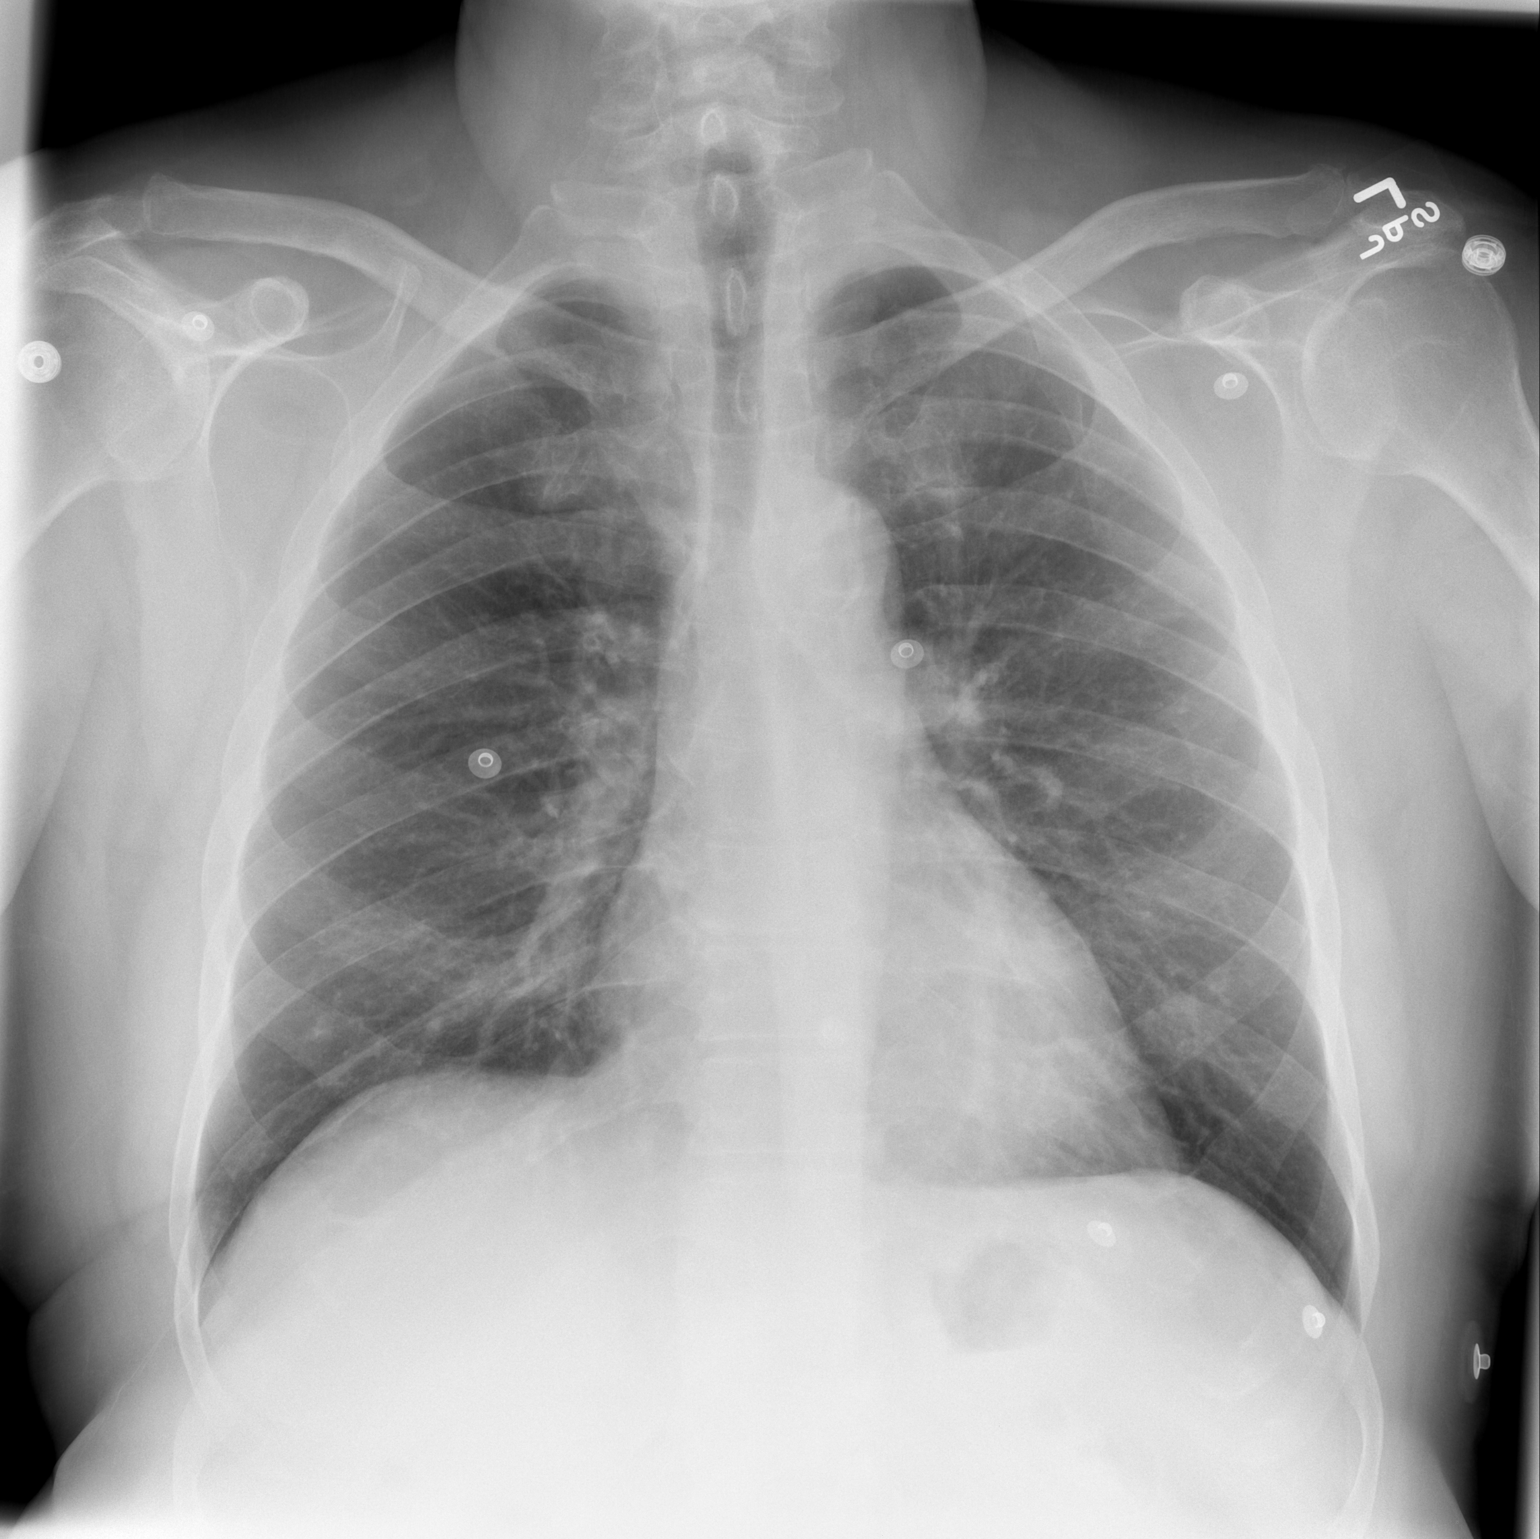

[2 of 2 positions shown; findings below may reference images not displayed]

FINDINGS: Cardiopericardial silhouette within normal limits. Mediastinal
contours normal. Trachea midline. No airspace disease or effusion.

Nodular densities are present over both lung bases. These
potentially represent nipple shadows. Repeat frontal view of the
chest recommended with nipple markers as the next step in
assessment.
IMPRESSION: 1. No acute cardiopulmonary disease.
2. Bilateral lower lobe pulmonary nodules which may represent nipple
shadows. Repeat frontal view of the chest is recommended with nipple
markers as the next step in assessment.

## 2015-01-26 ENCOUNTER — Telehealth: Payer: Self-pay | Admitting: Pain Medicine

## 2015-01-26 NOTE — Telephone Encounter (Signed)
Thank you :)

## 2015-01-26 NOTE — Telephone Encounter (Signed)
Has lost medicaid for at least 30 days / will call when gets ins. back

## 2015-01-30 ENCOUNTER — Ambulatory Visit: Payer: Self-pay | Admitting: Pain Medicine

## 2015-02-10 IMAGING — US US CAROTID DUPLEX BILAT
1 series · 13 of 24 positions shown · non-contrast
Comparison: None.

CLINICAL DATA: Slurred speech.

EXAM:
BILATERAL CAROTID DUPLEX ULTRASOUND
TECHNIQUE: Gray scale imaging, color Doppler and duplex ultrasound were
performed of bilateral carotid and vertebral arteries in the neck.

[Series 1: us carotid duplex bilat · 0.06mm/px · 13 of 55 slices shown]
[im 1/55]
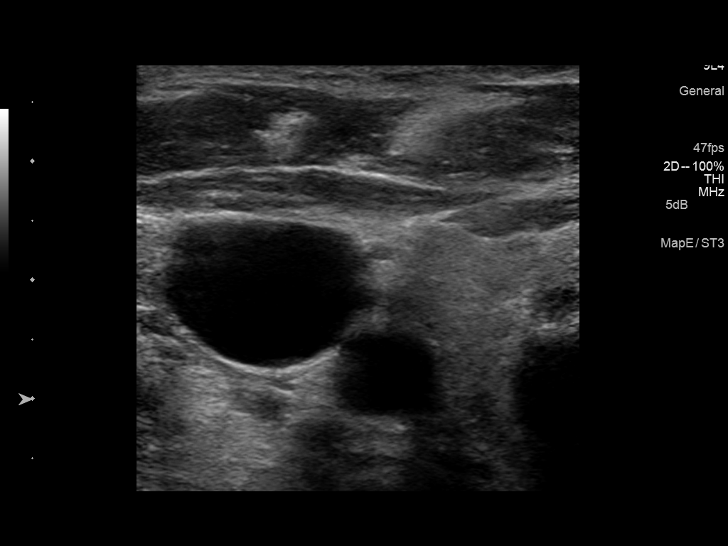
[im 5/55]
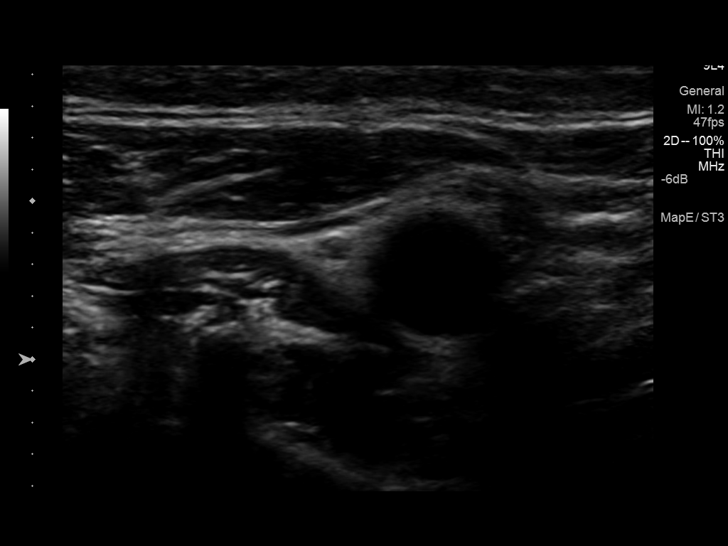
[im 10/55]
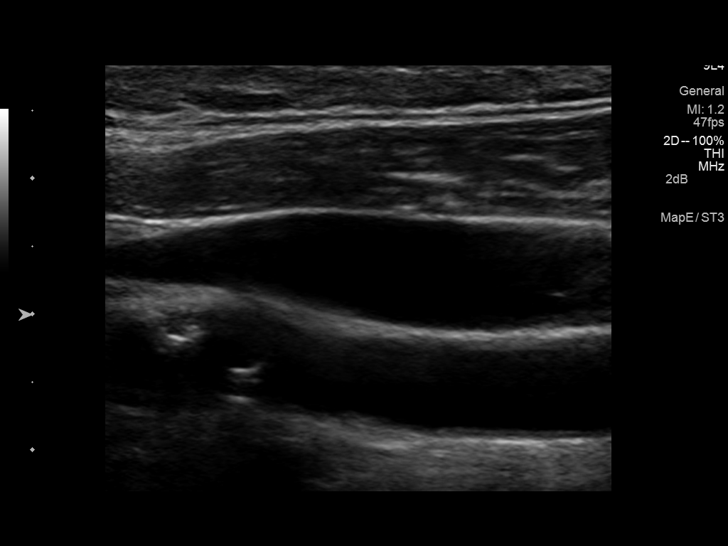
[im 15/55]
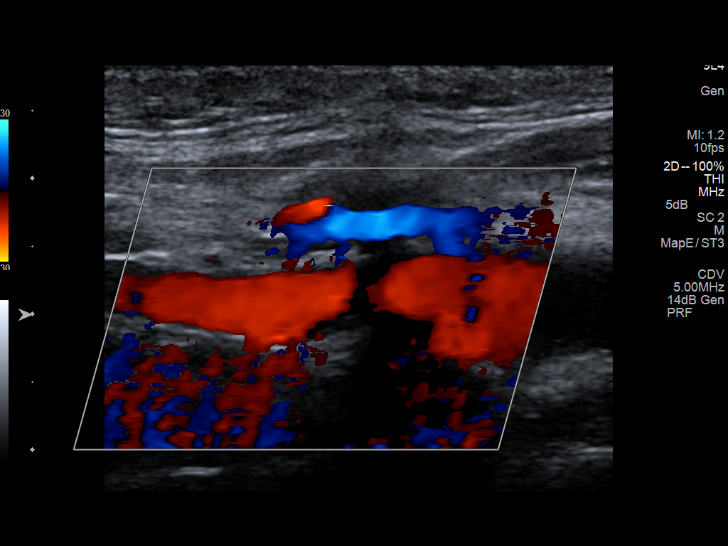
[im 19/55]
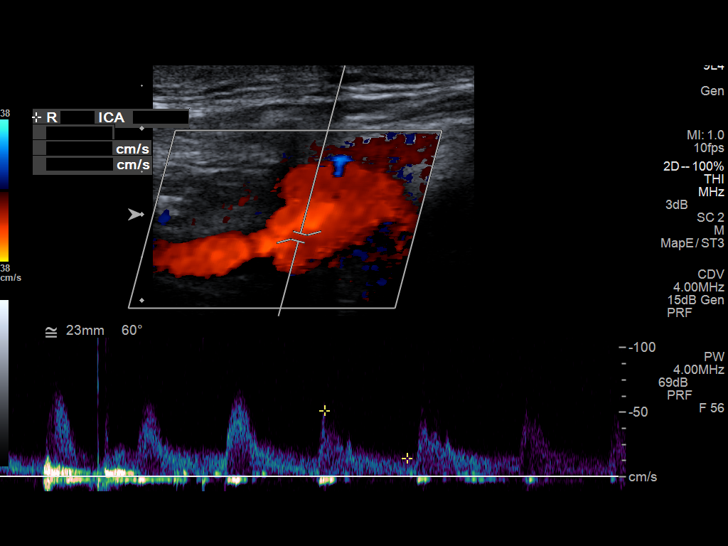
[im 24/55]
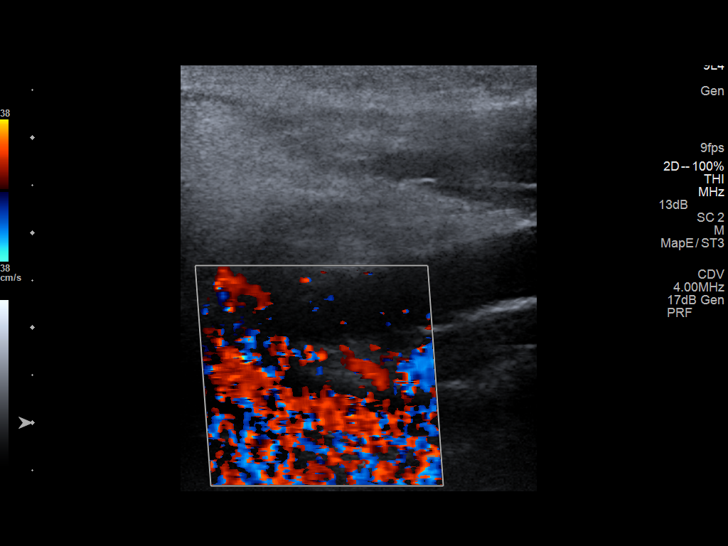
[im 29/55]
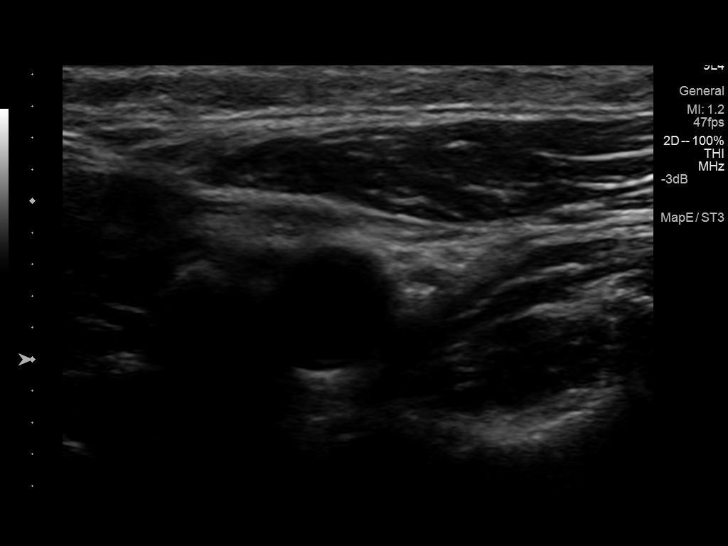
[im 31/55]
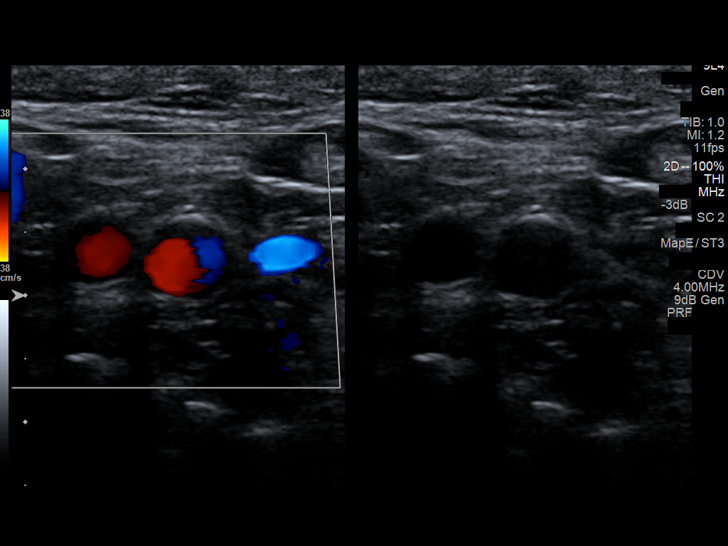
[im 36/55]
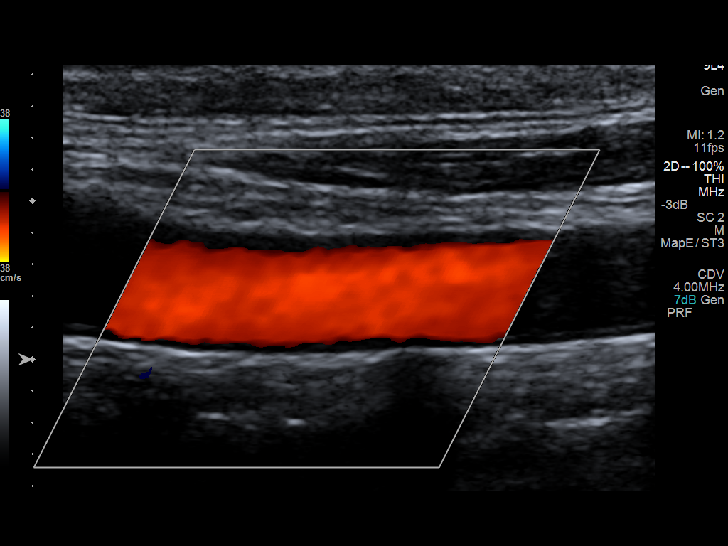
[im 40/55]
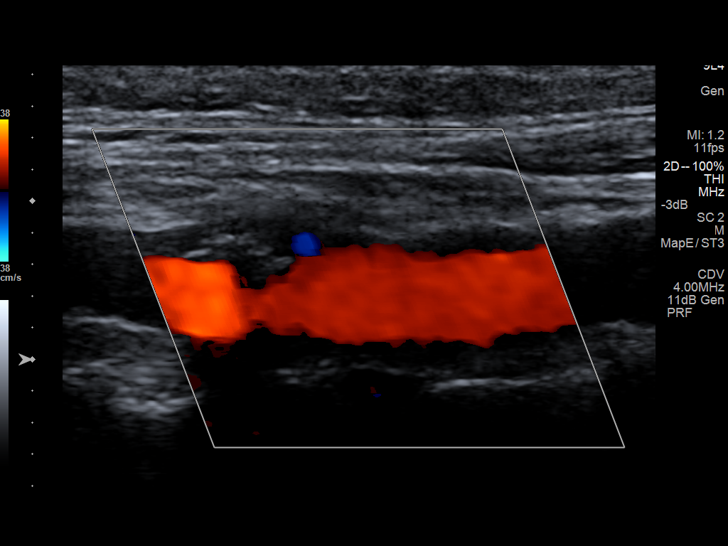
[im 45/55]
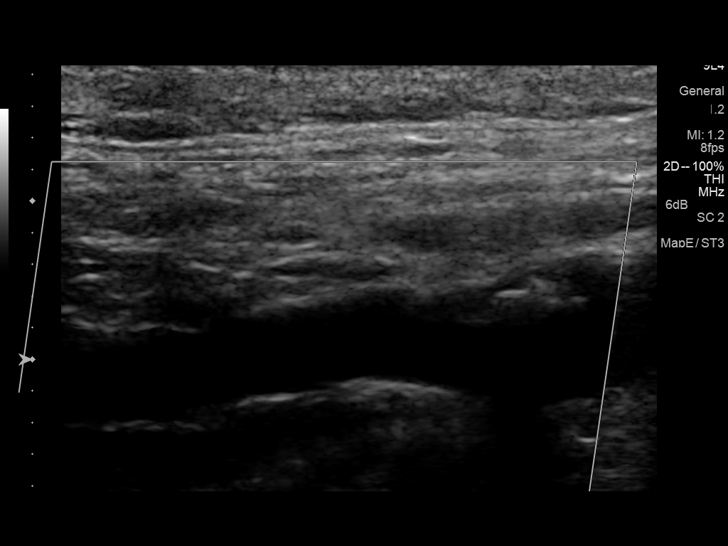
[im 50/55]
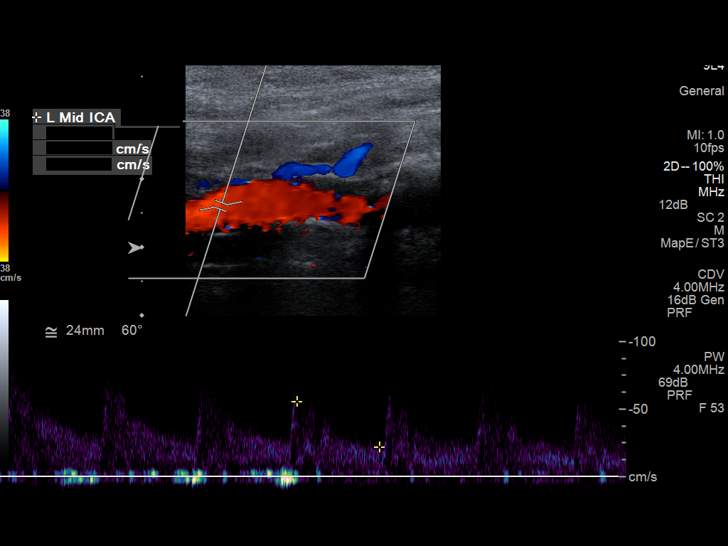
[im 55/55]
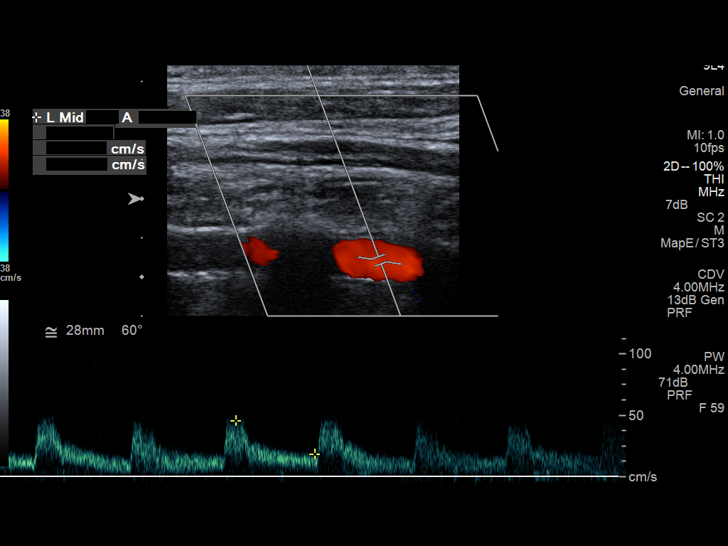

[13 of 24 positions shown; findings below may reference images not displayed]

FINDINGS: Criteria: Quantification of carotid stenosis is based on velocity
parameters that correlate the residual internal carotid diameter
with NASCET-based stenosis levels, using the diameter of the distal
internal carotid lumen as the denominator for stenosis measurement.

The following velocity measurements were obtained:

RIGHT

ICA:  59/26 cm/sec

CCA:  86/17 cm/sec

SYSTOLIC ICA/CCA RATIO:

DIASTOLIC ICA/CCA RATIO:

ECA:  84 cm/sec

LEFT

ICA:  74/30 cm/sec

CCA:  81/23 cm/sec

SYSTOLIC ICA/CCA RATIO:

DIASTOLIC ICA/CCA RATIO:

ECA:  44 cm/sec

RIGHT CAROTID ARTERY: There is a mild amount of partially calcified
plaque at the level of the distal bulb and proximal ICA. Estimated
right ICA stenosis is less than 50%.

RIGHT VERTEBRAL ARTERY: Antegrade flow with normal waveform and
velocity.

LEFT CAROTID ARTERY: There is a mild amount of partially calcified
plaque at the level of the bulb and proximal ICA. Plaque burden on
the left is greater than the right. Estimated left ICA stenosis is
less than 50%.

LEFT VERTEBRAL ARTERY: Antegrade flow with normal waveform and
velocity.
IMPRESSION: Bilateral atherosclerotic plaque, left greater than right. Estimated
bilateral ICA stenoses are less than 50%.

## 2015-04-13 ENCOUNTER — Ambulatory Visit: Payer: Self-pay | Admitting: Pain Medicine

## 2015-09-11 DIAGNOSIS — M2041 Other hammer toe(s) (acquired), right foot: Secondary | ICD-10-CM | POA: Diagnosis not present

## 2015-09-11 DIAGNOSIS — M2021 Hallux rigidus, right foot: Secondary | ICD-10-CM | POA: Diagnosis not present

## 2015-09-11 DIAGNOSIS — M6701 Short Achilles tendon (acquired), right ankle: Secondary | ICD-10-CM | POA: Diagnosis not present

## 2015-09-19 DIAGNOSIS — M7741 Metatarsalgia, right foot: Secondary | ICD-10-CM | POA: Diagnosis not present

## 2015-09-19 DIAGNOSIS — M2041 Other hammer toe(s) (acquired), right foot: Secondary | ICD-10-CM | POA: Diagnosis not present

## 2015-09-19 DIAGNOSIS — M6701 Short Achilles tendon (acquired), right ankle: Secondary | ICD-10-CM | POA: Diagnosis not present

## 2015-09-19 DIAGNOSIS — G8918 Other acute postprocedural pain: Secondary | ICD-10-CM | POA: Diagnosis not present

## 2015-09-19 DIAGNOSIS — M62471 Contracture of muscle, right ankle and foot: Secondary | ICD-10-CM | POA: Diagnosis not present

## 2015-09-19 DIAGNOSIS — M205X1 Other deformities of toe(s) (acquired), right foot: Secondary | ICD-10-CM | POA: Diagnosis not present

## 2016-01-30 DIAGNOSIS — M79671 Pain in right foot: Secondary | ICD-10-CM | POA: Diagnosis not present

## 2016-01-30 DIAGNOSIS — Z0001 Encounter for general adult medical examination with abnormal findings: Secondary | ICD-10-CM | POA: Diagnosis not present

## 2016-01-30 DIAGNOSIS — I10 Essential (primary) hypertension: Secondary | ICD-10-CM | POA: Diagnosis not present

## 2016-02-20 DIAGNOSIS — I679 Cerebrovascular disease, unspecified: Secondary | ICD-10-CM | POA: Diagnosis not present

## 2016-02-20 DIAGNOSIS — Z0001 Encounter for general adult medical examination with abnormal findings: Secondary | ICD-10-CM | POA: Diagnosis not present

## 2016-02-20 DIAGNOSIS — E782 Mixed hyperlipidemia: Secondary | ICD-10-CM | POA: Diagnosis not present

## 2016-02-20 DIAGNOSIS — R945 Abnormal results of liver function studies: Secondary | ICD-10-CM | POA: Diagnosis not present

## 2016-02-20 DIAGNOSIS — R7301 Impaired fasting glucose: Secondary | ICD-10-CM | POA: Diagnosis not present

## 2016-02-20 DIAGNOSIS — I10 Essential (primary) hypertension: Secondary | ICD-10-CM | POA: Diagnosis not present

## 2016-02-20 DIAGNOSIS — Z1211 Encounter for screening for malignant neoplasm of colon: Secondary | ICD-10-CM | POA: Diagnosis not present

## 2016-02-27 DIAGNOSIS — G479 Sleep disorder, unspecified: Secondary | ICD-10-CM | POA: Diagnosis not present

## 2016-02-27 DIAGNOSIS — I10 Essential (primary) hypertension: Secondary | ICD-10-CM | POA: Diagnosis not present

## 2016-02-27 DIAGNOSIS — R0600 Dyspnea, unspecified: Secondary | ICD-10-CM | POA: Diagnosis not present

## 2016-02-27 DIAGNOSIS — H25813 Combined forms of age-related cataract, bilateral: Secondary | ICD-10-CM | POA: Diagnosis not present

## 2016-02-27 DIAGNOSIS — Z23 Encounter for immunization: Secondary | ICD-10-CM | POA: Diagnosis not present

## 2016-03-06 DIAGNOSIS — G4733 Obstructive sleep apnea (adult) (pediatric): Secondary | ICD-10-CM | POA: Diagnosis not present

## 2016-03-06 DIAGNOSIS — G4700 Insomnia, unspecified: Secondary | ICD-10-CM | POA: Diagnosis not present

## 2016-03-20 DIAGNOSIS — R945 Abnormal results of liver function studies: Secondary | ICD-10-CM | POA: Diagnosis not present

## 2016-03-27 DIAGNOSIS — R0602 Shortness of breath: Secondary | ICD-10-CM | POA: Diagnosis not present

## 2016-04-02 DIAGNOSIS — M79671 Pain in right foot: Secondary | ICD-10-CM | POA: Diagnosis not present

## 2016-04-02 DIAGNOSIS — J449 Chronic obstructive pulmonary disease, unspecified: Secondary | ICD-10-CM | POA: Diagnosis not present

## 2016-04-02 DIAGNOSIS — G4733 Obstructive sleep apnea (adult) (pediatric): Secondary | ICD-10-CM | POA: Diagnosis not present

## 2016-04-02 DIAGNOSIS — G4723 Circadian rhythm sleep disorder, irregular sleep wake type: Secondary | ICD-10-CM | POA: Diagnosis not present

## 2016-04-11 DIAGNOSIS — J449 Chronic obstructive pulmonary disease, unspecified: Secondary | ICD-10-CM | POA: Diagnosis not present

## 2016-04-11 DIAGNOSIS — M79671 Pain in right foot: Secondary | ICD-10-CM | POA: Diagnosis not present

## 2016-04-17 ENCOUNTER — Other Ambulatory Visit: Payer: Self-pay | Admitting: Internal Medicine

## 2016-04-17 ENCOUNTER — Ambulatory Visit
Admission: RE | Admit: 2016-04-17 | Discharge: 2016-04-17 | Disposition: A | Discharge status: Home / Self Care | Payer: Medicare Other | Source: Ambulatory Visit | Attending: Internal Medicine | Admitting: Internal Medicine

## 2016-04-17 DIAGNOSIS — J449 Chronic obstructive pulmonary disease, unspecified: Secondary | ICD-10-CM | POA: Diagnosis not present

## 2016-04-17 DIAGNOSIS — R918 Other nonspecific abnormal finding of lung field: Secondary | ICD-10-CM | POA: Diagnosis not present

## 2016-04-17 DIAGNOSIS — I700 Atherosclerosis of aorta: Secondary | ICD-10-CM | POA: Diagnosis not present

## 2016-04-17 DIAGNOSIS — J984 Other disorders of lung: Secondary | ICD-10-CM | POA: Diagnosis not present

## 2016-05-23 ENCOUNTER — Other Ambulatory Visit: Payer: Self-pay | Admitting: Gastroenterology

## 2016-05-23 DIAGNOSIS — B182 Chronic viral hepatitis C: Secondary | ICD-10-CM

## 2016-05-30 ENCOUNTER — Ambulatory Visit
Admission: RE | Admit: 2016-05-30 | Discharge: 2016-05-30 | Disposition: A | Discharge status: Home / Self Care | Payer: Medicare Other | Source: Ambulatory Visit | Attending: Gastroenterology | Admitting: Gastroenterology

## 2016-06-06 ENCOUNTER — Ambulatory Visit
Admission: RE | Admit: 2016-06-06 | Discharge: 2016-06-06 | Disposition: A | Discharge status: Home / Self Care | Payer: Medicare Other | Source: Ambulatory Visit | Attending: Gastroenterology | Admitting: Gastroenterology

## 2016-06-06 DIAGNOSIS — B182 Chronic viral hepatitis C: Secondary | ICD-10-CM | POA: Diagnosis not present

## 2016-06-06 DIAGNOSIS — K760 Fatty (change of) liver, not elsewhere classified: Secondary | ICD-10-CM | POA: Insufficient documentation

## 2016-06-06 DIAGNOSIS — K8020 Calculus of gallbladder without cholecystitis without obstruction: Secondary | ICD-10-CM | POA: Insufficient documentation

## 2016-07-09 DIAGNOSIS — R768 Other specified abnormal immunological findings in serum: Secondary | ICD-10-CM | POA: Diagnosis not present

## 2016-07-09 DIAGNOSIS — Z8619 Personal history of other infectious and parasitic diseases: Secondary | ICD-10-CM | POA: Diagnosis not present

## 2016-09-03 DIAGNOSIS — G4723 Circadian rhythm sleep disorder, irregular sleep wake type: Secondary | ICD-10-CM | POA: Diagnosis not present

## 2016-09-03 DIAGNOSIS — J449 Chronic obstructive pulmonary disease, unspecified: Secondary | ICD-10-CM | POA: Diagnosis not present

## 2016-09-03 DIAGNOSIS — G4733 Obstructive sleep apnea (adult) (pediatric): Secondary | ICD-10-CM | POA: Diagnosis not present

## 2016-11-12 ENCOUNTER — Other Ambulatory Visit: Payer: Self-pay | Admitting: Gastroenterology

## 2016-11-12 DIAGNOSIS — Z8619 Personal history of other infectious and parasitic diseases: Secondary | ICD-10-CM

## 2016-11-18 ENCOUNTER — Ambulatory Visit
Admission: RE | Admit: 2016-11-18 | Discharge: 2016-11-18 | Disposition: A | Discharge status: Home / Self Care | Payer: Medicare Other | Source: Ambulatory Visit | Attending: Gastroenterology | Admitting: Gastroenterology

## 2016-12-04 ENCOUNTER — Ambulatory Visit
Admission: RE | Admit: 2016-12-04 | Discharge: 2016-12-04 | Disposition: A | Discharge status: Home / Self Care | Payer: Medicare Other | Source: Ambulatory Visit | Attending: Gastroenterology | Admitting: Gastroenterology

## 2016-12-04 DIAGNOSIS — Z8619 Personal history of other infectious and parasitic diseases: Secondary | ICD-10-CM

## 2016-12-13 ENCOUNTER — Encounter: Payer: Self-pay | Admitting: *Deleted

## 2016-12-16 ENCOUNTER — Ambulatory Visit
Admission: RE | Admit: 2016-12-16 | Discharge: 2016-12-16 | Disposition: A | Discharge status: Home / Self Care | Payer: Medicare Other | Source: Ambulatory Visit | Attending: Gastroenterology | Admitting: Gastroenterology

## 2016-12-16 ENCOUNTER — Ambulatory Visit: Payer: Medicare Other | Admitting: Anesthesiology

## 2016-12-16 ENCOUNTER — Encounter
Admission: RE | Disposition: A | Discharge status: Home / Self Care | Payer: Self-pay | Source: Ambulatory Visit | Attending: Gastroenterology

## 2016-12-16 DIAGNOSIS — Z79899 Other long term (current) drug therapy: Secondary | ICD-10-CM | POA: Insufficient documentation

## 2016-12-16 DIAGNOSIS — J449 Chronic obstructive pulmonary disease, unspecified: Secondary | ICD-10-CM | POA: Diagnosis not present

## 2016-12-16 DIAGNOSIS — F17200 Nicotine dependence, unspecified, uncomplicated: Secondary | ICD-10-CM | POA: Diagnosis not present

## 2016-12-16 DIAGNOSIS — Z1211 Encounter for screening for malignant neoplasm of colon: Secondary | ICD-10-CM | POA: Insufficient documentation

## 2016-12-16 DIAGNOSIS — K621 Rectal polyp: Secondary | ICD-10-CM | POA: Diagnosis not present

## 2016-12-16 DIAGNOSIS — M79671 Pain in right foot: Secondary | ICD-10-CM | POA: Insufficient documentation

## 2016-12-16 DIAGNOSIS — I10 Essential (primary) hypertension: Secondary | ICD-10-CM | POA: Diagnosis not present

## 2016-12-16 DIAGNOSIS — Z791 Long term (current) use of non-steroidal anti-inflammatories (NSAID): Secondary | ICD-10-CM | POA: Diagnosis not present

## 2016-12-16 DIAGNOSIS — G8929 Other chronic pain: Secondary | ICD-10-CM | POA: Diagnosis not present

## 2016-12-16 DIAGNOSIS — Z885 Allergy status to narcotic agent status: Secondary | ICD-10-CM | POA: Diagnosis not present

## 2016-12-16 DIAGNOSIS — G4730 Sleep apnea, unspecified: Secondary | ICD-10-CM | POA: Insufficient documentation

## 2016-12-16 DIAGNOSIS — Z880 Allergy status to penicillin: Secondary | ICD-10-CM | POA: Insufficient documentation

## 2016-12-16 DIAGNOSIS — Z882 Allergy status to sulfonamides status: Secondary | ICD-10-CM | POA: Diagnosis not present

## 2016-12-16 DIAGNOSIS — Z9889 Other specified postprocedural states: Secondary | ICD-10-CM | POA: Diagnosis not present

## 2016-12-16 DIAGNOSIS — D128 Benign neoplasm of rectum: Secondary | ICD-10-CM | POA: Insufficient documentation

## 2016-12-16 DIAGNOSIS — Z7982 Long term (current) use of aspirin: Secondary | ICD-10-CM | POA: Insufficient documentation

## 2016-12-16 DIAGNOSIS — Z538 Procedure and treatment not carried out for other reasons: Secondary | ICD-10-CM | POA: Diagnosis not present

## 2016-12-16 DIAGNOSIS — B1920 Unspecified viral hepatitis C without hepatic coma: Secondary | ICD-10-CM | POA: Diagnosis not present

## 2016-12-16 HISTORY — DX: Syncope and collapse: R55

## 2016-12-16 HISTORY — PX: COLONOSCOPY WITH PROPOFOL: SHX5780

## 2016-12-16 HISTORY — DX: Sleep apnea, unspecified: G47.30

## 2016-12-16 HISTORY — DX: Major depressive disorder, single episode, unspecified: F32.9

## 2016-12-16 SURGERY — COLONOSCOPY WITH PROPOFOL
Anesthesia: General

## 2016-12-16 MED ORDER — MIDAZOLAM HCL 2 MG/2ML IJ SOLN
INTRAMUSCULAR | Status: DC | PRN
Start: 2016-12-16 — End: 2016-12-16
  Administered 2016-12-16: 1 mg via INTRAVENOUS

## 2016-12-16 MED ORDER — PHENYLEPHRINE HCL 10 MG/ML IJ SOLN
INTRAMUSCULAR | Status: DC | PRN
  Administered 2016-12-16: 50 ug via INTRAVENOUS

## 2016-12-16 MED ORDER — SODIUM CHLORIDE 0.9 % IV SOLN
INTRAVENOUS | Status: DC
  Administered 2016-12-16: 08:00:00 via INTRAVENOUS

## 2016-12-16 MED ORDER — PROPOFOL 10 MG/ML IV BOLUS
INTRAVENOUS | Status: DC | PRN
  Administered 2016-12-16: 70 mg via INTRAVENOUS

## 2016-12-16 MED ORDER — MIDAZOLAM HCL 2 MG/2ML IJ SOLN
INTRAMUSCULAR | Status: AC
  Filled 2016-12-16: qty 2

## 2016-12-16 MED ORDER — PROPOFOL 500 MG/50ML IV EMUL
INTRAVENOUS | Status: DC | PRN
  Administered 2016-12-16: 140 ug/kg/min via INTRAVENOUS

## 2016-12-16 MED ORDER — LIDOCAINE HCL (CARDIAC) 20 MG/ML IV SOLN
INTRAVENOUS | Status: DC | PRN
  Administered 2016-12-16: 40 mg via INTRAVENOUS

## 2016-12-16 MED ORDER — PROPOFOL 500 MG/50ML IV EMUL
INTRAVENOUS | Status: AC
  Filled 2016-12-16: qty 50

## 2016-12-16 MED ORDER — SODIUM CHLORIDE 0.9 % IV SOLN: INTRAVENOUS | Status: DC

## 2016-12-16 NOTE — Anesthesia Preprocedure Evaluation (Signed)
Anesthesia Evaluation  Patient identified by MRN, date of birth, ID band Patient awake    Reviewed: Allergy & Precautions, H&P , NPO status , Patient's Chart, lab work & pertinent test results  History of Anesthesia Complications Negative for: history of anesthetic complications  Airway Mallampati: III  TM Distance: >3 FB Neck ROM: limited    Dental  (+) Poor Dentition, Missing, Upper Dentures, Edentulous Upper, Edentulous Lower   Pulmonary neg shortness of breath, sleep apnea , COPD, Current Smoker,           Cardiovascular Exercise Tolerance: Good hypertension, (-) angina(-) Past MI and (-) DOE      Neuro/Psych Seizures -, Well Controlled,  PSYCHIATRIC DISORDERS  Neuromuscular disease    GI/Hepatic negative GI ROS, (+) Hepatitis -, C  Endo/Other  negative endocrine ROS  Renal/GU negative Renal ROS  negative genitourinary   Musculoskeletal   Abdominal   Peds  Hematology negative hematology ROS (+)   Anesthesia Other Findings Past Medical History: No date: Alcohol abuse No date: Benzodiazepine dependence (HCC) No date: Benzodiazepine withdrawal (HCC) No date: Chronic pain in right foot No date: Hepatitis C No date: Hypertension 2010: Seizures ([place]) No date: Sleep apnea No date: Syncope     Comment:  questionable vasovagal  Past Surgical History: No date: ANKLE FRACTURE SURGERY; Right No date: HEMORRHOID SURGERY No date: LITHOTRIPSY No date: TONSILLECTOMY  BMI    Body Mass Index:  29.39 kg/m      Reproductive/Obstetrics negative OB ROS                             Anesthesia Physical Anesthesia Plan  ASA: III  Anesthesia Plan: General   Post-op Pain Management:    Induction: Intravenous  PONV Risk Score and Plan:   Airway Management Planned: Natural Airway and Nasal Cannula  Additional Equipment:   Intra-op Plan:   Post-operative Plan:   Informed Consent: I  have reviewed the patients History and Physical, chart, labs and discussed the procedure including the risks, benefits and alternatives for the proposed anesthesia with the patient or authorized representative who has indicated his/her understanding and acceptance.   Dental Advisory Given  Plan Discussed with: Anesthesiologist, CRNA and Surgeon  Anesthesia Plan Comments: (Patient consented for risks of anesthesia including but not limited to:  - adverse reactions to medications - risk of intubation if required - damage to teeth, lips or other oral mucosa - sore throat or hoarseness - Damage to heart, brain, lungs or loss of life  Patient voiced understanding.)        Anesthesia Quick Evaluation

## 2016-12-16 NOTE — H&P (Signed)
Outpatient short stay form Pre-procedure 12/16/2016 8:20 AM Anthony Sails MD  Primary Physician: Dr Clayborn Bigness  Reason for visit:  Colonoscopy  History of present illness:  Patient is a 66 year old male presenting today as above. All her his prep well. He takes 81 mg aspirin this been held for several days. He takes no other blood thinning agents or aspirin products. He does have a history of hepatitis C that has recently completed treatment. He is currently negative.    Current Facility-Administered Medications:  .  0.9 %  sodium chloride infusion, , Intravenous, Continuous, Anthony Sails, MD, Last Rate: 20 mL/hr at 12/16/16 0747 .  0.9 %  sodium chloride infusion, , Intravenous, Continuous, Anthony Sails, MD  Facility-Administered Medications Ordered in Other Encounters:  .  lidocaine (cardiac) 100 mg/67ml (XYLOCAINE) 20 MG/ML injection 2%, , , Anesthesia Intra-op, Hedda Slade, CRNA, 40 mg at 12/16/16 0818  Prescriptions Prior to Admission  Medication Sig Dispense Refill Last Dose  . aspirin EC 81 MG tablet Take 81 mg by mouth daily.   12/11/2016  . magnesium oxide (MAG-OX) 400 MG tablet Take 400 mg by mouth daily.   12/11/2016  . Multiple Vitamin (MULTIVITAMIN WITH MINERALS) TABS tablet Take 1 tablet by mouth daily.   12/11/2016     Allergies  Allergen Reactions  . Opana [Oxymorphone Hcl]     Made him BLACKOUT  . Amoxicillin   . Sulfur     Childhood reaction      Past Medical History:  Diagnosis Date  . Alcohol abuse   . Benzodiazepine dependence ([place])   . Benzodiazepine withdrawal ([place])   . Chronic pain in right foot   . Hepatitis C   . Hypertension   . Seizures ([place]) 2010  . Sleep apnea   . Syncope    questionable vasovagal    Review of systems:      Physical Exam    Heart and lungs: Regular rate and rhythm without rub or gallop, lungs are bilaterally clear.    HEENT: Normocephalic atraumatic eyes are anicteric    Other:     Pertinant exam for  procedure: Soft nontender nondistended bowel sounds positive normoactive    Planned proceedures: Colonoscopy and indicated procedures. I have discussed the risks benefits and complications of procedures to include not limited to bleeding, infection, perforation and the risk of sedation and the patient wishes to proceed.    Anthony Sails, MD Gastroenterology 12/16/2016  8:20 AM

## 2016-12-16 NOTE — Op Note (Signed)
[facility] Gastroenterology Patient Name: [name] Procedure Date: 12/16/2016 8:16 AM MRN: [id number] Account #: [id number] Date of Birth: [birth year] Admit Type: Outpatient Age: 66 Room: [facility] ENDO ROOM 1 Gender: Male Note Status: Finalized Procedure:            Colonoscopy Indications:          Screening for colorectal malignant neoplasm Providers:            Lollie Sails, MD Referring MD:         Lavera Guise, MD (Referring MD) Medicines:            Monitored Anesthesia Care Complications:        No immediate complications. Procedure:            Pre-Anesthesia Assessment:                       - ASA Grade Assessment: III - A patient with severe                        systemic disease.                       After obtaining informed consent, the colonoscope was                        passed under direct vision. Throughout the procedure,                        the patient's blood pressure, pulse, and oxygen                        saturations were monitored continuously. The                        Colonoscope was introduced through the anus and                        advanced to the the cecum, identified by appendiceal                        orifice and ileocecal valve. The colonoscopy was                        unusually difficult due to inadequate bowel prep. The                        patient tolerated the procedure well. The quality of                        the bowel preparation was poor. Findings:      Five sessile polyps were found in the rectum. The polyps were 1 to 3 mm       in size. These polyps were removed with a cold biopsy forceps. Resection       and retrieval were complete. Impression:           - Preparation of the colon was poor.                       - Five 1 to 3 mm polyps in the rectum, removed with a  cold biopsy forceps. Resected and retrieved. Recommendation:       - Await pathology results.              - repeat prep and reschedule procedure. Procedure Code(s):    --- Professional ---                       [phone], Colonoscopy, flexible; with biopsy, single or                        multiple Diagnosis Code(s):    --- Professional ---                       Z12.11, Encounter for screening for malignant neoplasm                        of colon                       K62.1, Rectal polyp CPT copyright 2016 American Medical Association. All rights reserved. The codes documented in this report are preliminary and upon coder review may  be revised to meet current compliance requirements. Lollie Sails, MD 12/16/2016 8:39:06 AM This report has been signed electronically. Number of Addenda: 0 Note Initiated On: 12/16/2016 8:16 AM Total Procedure Duration: 0 hours 8 minutes 13 seconds       [facility]

## 2016-12-16 NOTE — Anesthesia Post-op Follow-up Note (Cosign Needed)
Anesthesia QCDR form completed.        

## 2016-12-16 NOTE — Anesthesia Postprocedure Evaluation (Signed)
Anesthesia Post Note  Patient: [name]  Procedure(s) Performed: Procedure(s) (LRB): COLONOSCOPY WITH PROPOFOL (N/A)  Patient location during evaluation: Endoscopy Anesthesia Type: General Level of consciousness: awake and alert Pain management: pain level controlled Vital Signs Assessment: post-procedure vital signs reviewed and stable Respiratory status: spontaneous breathing, nonlabored ventilation, respiratory function stable and patient connected to nasal cannula oxygen Cardiovascular status: blood pressure returned to baseline and stable Postop Assessment: no signs of nausea or vomiting Anesthetic complications: no     Last Vitals:  Vitals:   12/16/16 0900 12/16/16 0910  BP: 137/76 (!) 144/73  Pulse: 60 (!) 58  Resp: 17 18  Temp:      Last Pain:  Vitals:   12/16/16 0839  TempSrc: Tympanic  PainSc:                  [name]

## 2016-12-16 NOTE — Transfer of Care (Signed)
Immediate Anesthesia Transfer of Care Note  Patient: [name]  Procedure(s) Performed: Procedure(s): COLONOSCOPY WITH PROPOFOL (N/A)  Patient Location: PACU  Anesthesia Type:General  Level of Consciousness: awake, alert  and oriented  Airway & Oxygen Therapy: Patient Spontanous Breathing and Patient connected to nasal cannula oxygen  Post-op Assessment: Report given to RN and Post -op Vital signs reviewed and stable  Post vital signs: Reviewed and stable  Last Vitals:  Vitals:   12/16/16 0839 12/16/16 0840  BP: 108/73   Pulse: 65   Resp: 12   Temp: (!) 36 C (!) 35.7 C    Last Pain:  Vitals:   12/16/16 0839  TempSrc: Tympanic  PainSc:          Complications: No apparent anesthesia complications

## 2016-12-17 ENCOUNTER — Encounter: Payer: Self-pay | Admitting: Gastroenterology

## 2016-12-17 LAB — SURGICAL PATHOLOGY

## 2016-12-18 ENCOUNTER — Ambulatory Visit
Admission: RE | Admit: 2016-12-18 | Discharge: 2016-12-18 | Disposition: A | Discharge status: Home / Self Care | Payer: Medicare Other | Source: Ambulatory Visit | Attending: Gastroenterology | Admitting: Gastroenterology

## 2016-12-18 DIAGNOSIS — K8020 Calculus of gallbladder without cholecystitis without obstruction: Secondary | ICD-10-CM | POA: Insufficient documentation

## 2016-12-18 DIAGNOSIS — Z8619 Personal history of other infectious and parasitic diseases: Secondary | ICD-10-CM | POA: Diagnosis not present

## 2017-02-06 DIAGNOSIS — H25813 Combined forms of age-related cataract, bilateral: Secondary | ICD-10-CM | POA: Diagnosis not present

## 2017-03-14 ENCOUNTER — Ambulatory Visit
Admission: RE | Admit: 2017-03-14 | Discharge: 2017-03-14 | Disposition: A | Discharge status: Home / Self Care | Payer: Medicare Other | Source: Ambulatory Visit | Attending: Gastroenterology | Admitting: Gastroenterology

## 2017-03-14 ENCOUNTER — Encounter
Admission: RE | Disposition: A | Discharge status: Home / Self Care | Payer: Self-pay | Source: Ambulatory Visit | Attending: Gastroenterology

## 2017-03-14 ENCOUNTER — Ambulatory Visit: Payer: Medicare Other | Admitting: Anesthesiology

## 2017-03-14 DIAGNOSIS — Z880 Allergy status to penicillin: Secondary | ICD-10-CM | POA: Insufficient documentation

## 2017-03-14 DIAGNOSIS — D123 Benign neoplasm of transverse colon: Secondary | ICD-10-CM | POA: Insufficient documentation

## 2017-03-14 DIAGNOSIS — D125 Benign neoplasm of sigmoid colon: Secondary | ICD-10-CM | POA: Diagnosis not present

## 2017-03-14 DIAGNOSIS — K621 Rectal polyp: Secondary | ICD-10-CM | POA: Insufficient documentation

## 2017-03-14 DIAGNOSIS — D127 Benign neoplasm of rectosigmoid junction: Secondary | ICD-10-CM | POA: Insufficient documentation

## 2017-03-14 DIAGNOSIS — Z7982 Long term (current) use of aspirin: Secondary | ICD-10-CM | POA: Insufficient documentation

## 2017-03-14 DIAGNOSIS — Z1211 Encounter for screening for malignant neoplasm of colon: Secondary | ICD-10-CM | POA: Diagnosis not present

## 2017-03-14 DIAGNOSIS — C189 Malignant neoplasm of colon, unspecified: Secondary | ICD-10-CM | POA: Diagnosis not present

## 2017-03-14 DIAGNOSIS — G4730 Sleep apnea, unspecified: Secondary | ICD-10-CM | POA: Diagnosis not present

## 2017-03-14 DIAGNOSIS — Z79899 Other long term (current) drug therapy: Secondary | ICD-10-CM | POA: Diagnosis not present

## 2017-03-14 DIAGNOSIS — K635 Polyp of colon: Secondary | ICD-10-CM | POA: Diagnosis not present

## 2017-03-14 DIAGNOSIS — I10 Essential (primary) hypertension: Secondary | ICD-10-CM | POA: Insufficient documentation

## 2017-03-14 DIAGNOSIS — D122 Benign neoplasm of ascending colon: Secondary | ICD-10-CM | POA: Insufficient documentation

## 2017-03-14 DIAGNOSIS — D124 Benign neoplasm of descending colon: Secondary | ICD-10-CM | POA: Insufficient documentation

## 2017-03-14 DIAGNOSIS — D126 Benign neoplasm of colon, unspecified: Secondary | ICD-10-CM | POA: Diagnosis not present

## 2017-03-14 DIAGNOSIS — K5730 Diverticulosis of large intestine without perforation or abscess without bleeding: Secondary | ICD-10-CM | POA: Diagnosis not present

## 2017-03-14 HISTORY — PX: COLONOSCOPY WITH PROPOFOL: SHX5780

## 2017-03-14 SURGERY — COLONOSCOPY WITH PROPOFOL
Anesthesia: General

## 2017-03-14 MED ORDER — SODIUM CHLORIDE 0.9 % IV SOLN: INTRAVENOUS | Status: DC

## 2017-03-14 MED ORDER — PROPOFOL 500 MG/50ML IV EMUL
INTRAVENOUS | Status: DC | PRN
  Administered 2017-03-14: 125 ug/kg/min via INTRAVENOUS

## 2017-03-14 MED ORDER — PROPOFOL 10 MG/ML IV BOLUS
INTRAVENOUS | Status: DC | PRN
  Administered 2017-03-14: 60 mg via INTRAVENOUS

## 2017-03-14 MED ORDER — SODIUM CHLORIDE 0.9 % IV SOLN
INTRAVENOUS | Status: DC
  Administered 2017-03-14: 11:00:00 via INTRAVENOUS

## 2017-03-14 MED ORDER — PHENYLEPHRINE HCL 10 MG/ML IJ SOLN
INTRAMUSCULAR | Status: DC | PRN
  Administered 2017-03-14 (×2): 100 ug via INTRAVENOUS

## 2017-03-14 NOTE — Anesthesia Preprocedure Evaluation (Signed)
Anesthesia Evaluation  Patient identified by MRN, date of birth, ID band Patient awake    Reviewed: Allergy & Precautions, H&P , NPO status , Patient's Chart, lab work & pertinent test results, reviewed documented beta blocker date and time   Airway Mallampati: II   Neck ROM: full    Dental  (+) Poor Dentition, Teeth Intact   Pulmonary neg pulmonary ROS, sleep apnea , Current Smoker,    Pulmonary exam normal        Cardiovascular Exercise Tolerance: Poor hypertension, On Medications negative cardio ROS Normal cardiovascular exam Rhythm:regular Rate:Normal     Neuro/Psych Seizures -, Well Controlled,  PSYCHIATRIC DISORDERS  Neuromuscular disease negative neurological ROS  negative psych ROS   GI/Hepatic negative GI ROS, Neg liver ROS, (+) Hepatitis -  Endo/Other  negative endocrine ROS  Renal/GU negative Renal ROS  negative genitourinary   Musculoskeletal   Abdominal   Peds  Hematology negative hematology ROS (+)   Anesthesia Other Findings Past Medical History: No date: Alcohol abuse No date: Benzodiazepine dependence (HCC) No date: Benzodiazepine withdrawal (HCC) No date: Chronic pain in right foot No date: Hepatitis C No date: Hypertension 2010: Seizures ([place]) No date: Sleep apnea No date: Syncope     Comment:  questionable vasovagal Past Surgical History: No date: ANKLE FRACTURE SURGERY; Right 12/16/2016: COLONOSCOPY WITH PROPOFOL; N/A     Comment:  Procedure: COLONOSCOPY WITH PROPOFOL;  Surgeon:               Lollie Sails, MD;  Location: [facility] ENDOSCOPY;                Service: Endoscopy;  Laterality: N/A; No date: HEMORRHOID SURGERY No date: LITHOTRIPSY No date: TONSILLECTOMY BMI    Body Mass Index:  28.70 kg/m     Reproductive/Obstetrics negative OB ROS                             Anesthesia Physical Anesthesia Plan  ASA: III  Anesthesia Plan: General    Post-op Pain Management:    Induction:   PONV Risk Score and Plan:   Airway Management Planned:   Additional Equipment:   Intra-op Plan:   Post-operative Plan:   Informed Consent: I have reviewed the patients History and Physical, chart, labs and discussed the procedure including the risks, benefits and alternatives for the proposed anesthesia with the patient or authorized representative who has indicated his/her understanding and acceptance.   Dental Advisory Given  Plan Discussed with: CRNA  Anesthesia Plan Comments:         Anesthesia Quick Evaluation

## 2017-03-14 NOTE — Anesthesia Post-op Follow-up Note (Signed)
Anesthesia QCDR form completed.        

## 2017-03-14 NOTE — Transfer of Care (Signed)
Immediate Anesthesia Transfer of Care Note  Patient: [name]  Procedure(s) Performed: COLONOSCOPY WITH PROPOFOL (N/A )  Patient Location: PACU  Anesthesia Type:General  Level of Consciousness: sedated  Airway & Oxygen Therapy: Patient Spontanous Breathing and Patient connected to nasal cannula oxygen  Post-op Assessment: Report given to RN and Post -op Vital signs reviewed and stable  Post vital signs: Reviewed and stable  Last Vitals:  Vitals:   03/14/17 1040 03/14/17 1235  BP: 132/80 90/70  Pulse: 78 66  Resp: 16 14  Temp: (!) 35.7 C (!) 36.2 C  SpO2: 93% 94%    Last Pain:  Vitals:   03/14/17 1235  TempSrc: Tympanic  PainSc: Asleep         Complications: No apparent anesthesia complications

## 2017-03-14 NOTE — Anesthesia Procedure Notes (Signed)
Date/Time: 03/14/2017 12:06 PM Performed by: Nelda Marseille Pre-anesthesia Checklist: Patient identified, Emergency Drugs available, Suction available and Patient being monitored Oxygen Delivery Method: Nasal cannula

## 2017-03-14 NOTE — H&P (Signed)
Outpatient short stay form Pre-procedure 03/14/2017 11:34 AM Lollie Sails MD  Primary Physician: Dr Clayborn Bigness  Reason for visit:  Colon cancer screening  History of present illness:  Patient is a 66 year old male presenting today as above. He had an attempted colonoscopy on the sixth 2018 however had a poor prep and had to be rescheduled. He does take 81 mg aspirin regularly. He takes no other aspirin products or blood thinning agents.  Patient has completed treatment for hepatitis C and is negative currently. He tolerated his prep well.    Current Facility-Administered Medications:  .  0.9 %  sodium chloride infusion, , Intravenous, Continuous, Lollie Sails, MD, Last Rate: 20 mL/hr at 03/14/17 1128 .  0.9 %  sodium chloride infusion, , Intravenous, Continuous, Lollie Sails, MD  Prescriptions Prior to Admission  Medication Sig Dispense Refill Last Dose  . aspirin EC 81 MG tablet Take 81 mg by mouth daily.   Past Week at Unknown time  . celecoxib (CELEBREX) 200 MG capsule Take 200 mg by mouth 2 (two) times daily.   Past Week at Unknown time  . magnesium oxide (MAG-OX) 400 MG tablet Take 400 mg by mouth daily.   Past Week at Unknown time  . Multiple Vitamin (MULTIVITAMIN WITH MINERALS) TABS tablet Take 1 tablet by mouth daily.   Past Week at Unknown time     Allergies  Allergen Reactions  . Opana [Oxymorphone Hcl]     Made him BLACKOUT  . Amoxicillin   . Sulfur     Childhood reaction      Past Medical History:  Diagnosis Date  . Alcohol abuse   . Benzodiazepine dependence ([place])   . Benzodiazepine withdrawal ([place])   . Chronic pain in right foot   . Hepatitis C   . Hypertension   . Seizures ([place]) 2010  . Sleep apnea   . Syncope    questionable vasovagal    Review of systems:      Physical Exam    Heart and lungs: Regular rate and rhythm without rub or gallop, lungs are bilaterally clear.    HEENT: Normocephalic atraumatic eyes are anicteric   Other:     Pertinant exam for procedure: Soft nontender nondistended bowel sounds positive normoactive.    Planned proceedures: Colonoscopy and indicated procedures. I have discussed the risks benefits and complications of procedures to include not limited to bleeding, infection, perforation and the risk of sedation and the patient wishes to proceed.    Lollie Sails, MD Gastroenterology 03/14/2017  11:34 AM

## 2017-03-14 NOTE — Op Note (Addendum)
[facility] Gastroenterology Patient Name: [name] Procedure Date: 03/14/2017 11:16 AM MRN: [id number] Account #: [id number] Date of Birth: 03-17-1951 Admit Type: Outpatient Age: 66 Room: [facility] ENDO ROOM 1 Gender: Male Note Status: Finalized Procedure:            Colonoscopy Indications:          Screening for colorectal malignant neoplasm Providers:            Lollie Sails, MD Referring MD:         Lavera Guise, MD (Referring MD) Medicines:            Monitored Anesthesia Care Complications:        No immediate complications. Procedure:            Pre-Anesthesia Assessment:                       - ASA Grade Assessment: III - A patient with severe                        systemic disease.                       After obtaining informed consent, the colonoscope was                        passed under direct vision. Throughout the procedure,                        the patient's blood pressure, pulse, and oxygen                        saturations were monitored continuously. The                        Colonoscope was introduced through the anus and                        advanced to the the cecum, identified by appendiceal                        orifice and ileocecal valve. The colonoscopy was                        performed with moderate difficulty. Successful                        completion of the procedure was aided by changing the                        patient to a supine position, changing the patient to a                        prone position and using manual pressure. The patient                        tolerated the procedure well. The quality of the bowel                        preparation was fair. Findings:      Six sessile polyps were found in the recto-sigmoid colon.  The polyps       were 3 to 7 mm in size. These polyps were removed with a cold snare.       Resection and retrieval were complete.      A 2 mm polyp was found in the  hepatic flexure. The polyp was sessile.       The polyp was removed with a cold biopsy forceps. Resection and       retrieval were complete.      A 3 mm polyp was found in the proximal ascending colon. The polyp was       sessile. The polyp was removed with a cold biopsy forceps. Resection and       retrieval were complete.      A 3 mm polyp was found in the proximal descending colon. The polyp was       sessile. The polyp was removed with a cold biopsy forceps. Resection and       retrieval were complete.      A 3 mm polyp was found in the mid sigmoid colon. The polyp was sessile.       The polyp was removed with a cold biopsy forceps. Resection and       retrieval were complete.      Five sessile polyps were found in the recto-sigmoid colon. The polyps       were 1 to 3 mm in size. These polyps were removed with a cold biopsy       forceps. Resection and retrieval were complete.      A 3 mm polyp was found in the rectum. The polyp was sessile. The polyp       was removed with a cold biopsy forceps. Resection and retrieval were       complete.      A few small-mouthed diverticula were found in the sigmoid colon and       distal descending colon.      The retroflexed view of the distal rectum and anal verge was normal and       showed no anal or rectal abnormalities.      The digital rectal exam was normal. Impression:           - Preparation of the colon was fair.                       - Six 3 to 7 mm polyps at the recto-sigmoid colon,                        removed with a cold snare. Resected and retrieved.                       - One 2 mm polyp at the hepatic flexure, removed with a                        cold biopsy forceps. Resected and retrieved.                       - One 3 mm polyp in the proximal ascending colon,                        removed with a cold biopsy forceps. Resected and  retrieved.                       - One 3 mm polyp in the proximal  descending colon,                        removed with a cold biopsy forceps. Resected and                        retrieved.                       - One 3 mm polyp in the mid sigmoid colon, removed with                        a cold biopsy forceps. Resected and retrieved.                       - Five 1 to 3 mm polyps at the recto-sigmoid colon,                        removed with a cold biopsy forceps. Resected and                        retrieved.                       - One 3 mm polyp in the rectum, removed with a cold                        biopsy forceps. Resected and retrieved.                       - Diverticulosis in the sigmoid colon and in the distal                        descending colon.                       - The distal rectum and anal verge are normal on                        retroflexion view. Recommendation:       - Full liquid diet today, then advance as tolerated to                        advance diet as tolerated. Procedure Code(s):    --- Professional ---                       [phone], Colonoscopy, flexible; with removal of tumor(s),                        polyp(s), or other lesion(s) by snare technique                       34193, 38, Colonoscopy, flexible; with biopsy, single                        or multiple Diagnosis Code(s):    --- Professional ---  Z12.11, Encounter for screening for malignant neoplasm                        of colon                       D12.7, Benign neoplasm of rectosigmoid junction                       D12.3, Benign neoplasm of transverse colon (hepatic                        flexure or splenic flexure)                       D12.2, Benign neoplasm of ascending colon                       D12.4, Benign neoplasm of descending colon                       D12.5, Benign neoplasm of sigmoid colon                       K62.1, Rectal polyp                       K57.30, Diverticulosis of large intestine without                         perforation or abscess without bleeding CPT copyright 2016 American Medical Association. All rights reserved. The codes documented in this report are preliminary and upon coder review may  be revised to meet current compliance requirements. Lollie Sails, MD 03/14/2017 12:35:42 PM This report has been signed electronically. Number of Addenda: 0 Note Initiated On: 03/14/2017 11:16 AM Scope Withdrawal Time: 0 hours 29 minutes 29 seconds  Total Procedure Duration: 0 hours 48 minutes 47 seconds       [facility]

## 2017-03-17 ENCOUNTER — Encounter: Payer: Self-pay | Admitting: Gastroenterology

## 2017-03-17 LAB — SURGICAL PATHOLOGY

## 2017-03-17 NOTE — Anesthesia Postprocedure Evaluation (Signed)
Anesthesia Post Note  Patient: [name] IV  Procedure(s) Performed: COLONOSCOPY WITH PROPOFOL (N/A )  Patient location during evaluation: PACU Anesthesia Type: General Level of consciousness: awake and alert Pain management: pain level controlled Vital Signs Assessment: post-procedure vital signs reviewed and stable Respiratory status: spontaneous breathing, nonlabored ventilation, respiratory function stable and patient connected to nasal cannula oxygen Cardiovascular status: blood pressure returned to baseline and stable Postop Assessment: no apparent nausea or vomiting Anesthetic complications: no     Last Vitals:  Vitals:   03/14/17 1255 03/14/17 1305  BP: (!) 162/90 (!) 159/92  Pulse: 76 69  Resp: 15 17  Temp:    SpO2: 95% 97%    Last Pain:  Vitals:   03/15/17 1517  TempSrc:   PainSc: 0-No pain                 Molli Barrows

## 2017-03-25 ENCOUNTER — Other Ambulatory Visit: Payer: Self-pay | Admitting: Nurse Practitioner

## 2017-03-25 DIAGNOSIS — R4182 Altered mental status, unspecified: Secondary | ICD-10-CM

## 2017-03-25 DIAGNOSIS — I10 Essential (primary) hypertension: Secondary | ICD-10-CM | POA: Diagnosis not present

## 2017-04-01 ENCOUNTER — Ambulatory Visit: Payer: Medicare Other

## 2017-04-07 ENCOUNTER — Ambulatory Visit
Admission: RE | Admit: 2017-04-07 | Discharge: 2017-04-07 | Disposition: A | Discharge status: Home / Self Care | Payer: Medicare Other | Source: Ambulatory Visit | Attending: Nurse Practitioner | Admitting: Nurse Practitioner

## 2017-04-07 DIAGNOSIS — R413 Other amnesia: Secondary | ICD-10-CM | POA: Diagnosis present

## 2017-04-07 DIAGNOSIS — I6782 Cerebral ischemia: Secondary | ICD-10-CM | POA: Insufficient documentation

## 2017-04-07 DIAGNOSIS — R569 Unspecified convulsions: Secondary | ICD-10-CM | POA: Diagnosis not present

## 2017-04-07 DIAGNOSIS — J3489 Other specified disorders of nose and nasal sinuses: Secondary | ICD-10-CM | POA: Insufficient documentation

## 2017-04-07 DIAGNOSIS — R4182 Altered mental status, unspecified: Secondary | ICD-10-CM

## 2017-04-07 DIAGNOSIS — G319 Degenerative disease of nervous system, unspecified: Secondary | ICD-10-CM | POA: Diagnosis not present

## 2017-07-17 ENCOUNTER — Ambulatory Visit: Payer: Medicare Other | Admitting: Nurse Practitioner

## 2017-07-17 ENCOUNTER — Encounter: Payer: Self-pay | Admitting: Nurse Practitioner

## 2017-07-17 VITALS — BP 158/80 | HR 88 | Resp 16 | Ht 70.0 in | Wt 208.0 lb

## 2017-07-17 DIAGNOSIS — R300 Dysuria: Secondary | ICD-10-CM | POA: Diagnosis not present

## 2017-07-17 DIAGNOSIS — F5101 Primary insomnia: Secondary | ICD-10-CM | POA: Diagnosis not present

## 2017-07-17 DIAGNOSIS — E876 Hypokalemia: Secondary | ICD-10-CM

## 2017-07-17 DIAGNOSIS — I10 Essential (primary) hypertension: Secondary | ICD-10-CM

## 2017-07-17 DIAGNOSIS — Z0001 Encounter for general adult medical examination with abnormal findings: Secondary | ICD-10-CM

## 2017-07-17 DIAGNOSIS — M064 Inflammatory polyarthropathy: Secondary | ICD-10-CM | POA: Diagnosis not present

## 2017-07-17 DIAGNOSIS — Z125 Encounter for screening for malignant neoplasm of prostate: Secondary | ICD-10-CM

## 2017-07-17 MED ORDER — AMLODIPINE BESYLATE 5 MG PO TABS: 5.0000 mg | ORAL_TABLET | Freq: Every day | ORAL | 3 refills | Status: DC

## 2017-07-17 MED ORDER — CELECOXIB 200 MG PO CAPS: 200.0000 mg | ORAL_CAPSULE | Freq: Two times a day (BID) | ORAL | 3 refills | Status: DC

## 2017-07-17 MED ORDER — SUVOREXANT 10 MG PO TABS: 10.0000 mg | ORAL_TABLET | Freq: Every evening | ORAL | 0 refills | Status: DC | PRN

## 2017-07-17 NOTE — Progress Notes (Signed)
[facility] [place], [place] [address]  Internal MEDICINE  Office Visit Note  Patient Name: [name]  [id number]  [id number]  Date of Service: 08/06/2017  Chief Complaint  Patient presents with  . Hypertension     Patient is here for routine health maintenance exam. He admits to having increase in his stress levels. His father had stroke 2 weeks ago. Survived, but is weak. Is having to take him and his mother back and forth to doctor's appointments. He is feeding them and helping to take physical care of both parents. The patient states that he is not sleeping well. Having nightmares. Will wake up with nightmare and be afraid to go back to sleep. Blood pressure is elevated.    Pt is here for routine health maintenance examination  Current Medication: Outpatient Encounter Medications as of 07/17/2017  Medication Sig  . magnesium oxide (MAG-OX) 400 MG tablet Take 400 mg by mouth daily.  . Multiple Vitamin (MULTIVITAMIN WITH MINERALS) TABS tablet Take 1 tablet by mouth daily.  . Potassium 99 MG TABS Take by mouth.  Marland Kitchen amLODipine (NORVASC) 5 MG tablet Take 1 tablet (5 mg total) by mouth daily.  Marland Kitchen aspirin EC 81 MG tablet Take 81 mg by mouth daily.  . celecoxib (CELEBREX) 200 MG capsule Take 1 capsule (200 mg total) by mouth 2 (two) times daily.  . Suvorexant (BELSOMRA) 10 MG TABS Take 10 mg by mouth at bedtime as needed.  . [DISCONTINUED] celecoxib (CELEBREX) 200 MG capsule Take 200 mg by mouth 2 (two) times daily.   No facility-administered encounter medications on file as of 07/17/2017.     Surgical History: Past Surgical History:  Procedure Laterality Date  . ANKLE FRACTURE SURGERY Right   . COLONOSCOPY WITH PROPOFOL N/A 12/16/2016   Procedure: COLONOSCOPY WITH PROPOFOL;  Surgeon: Lollie Sails, MD;  Location: [facility] ENDOSCOPY;  Service: Endoscopy;  Laterality: N/A;  . COLONOSCOPY WITH PROPOFOL N/A 03/14/2017   Procedure: COLONOSCOPY WITH  PROPOFOL;  Surgeon: Lollie Sails, MD;  Location: [facility] ENDOSCOPY;  Service: Endoscopy;  Laterality: N/A;  . HEMORRHOID SURGERY    . LITHOTRIPSY    . TONSILLECTOMY      Medical History: Past Medical History:  Diagnosis Date  . Alcohol abuse   . Benzodiazepine dependence ([place])   . Benzodiazepine withdrawal ([place])   . Chronic pain in right foot   . Hepatitis C   . Hypertension   . Seizures ([place]) 2010  . Sleep apnea   . Syncope    questionable vasovagal    Family History: Family History  Problem Relation Age of Onset  . Arthritis Mother   . Hypertension Mother   . Cancer Father   . Kidney disease Father   . Alcohol abuse Paternal Aunt   . Depression Maternal Grandfather       Review of Systems  Constitutional: Positive for appetite change. Negative for chills, fatigue and unexpected weight change.  HENT: Negative for congestion, postnasal drip, rhinorrhea, sneezing and sore throat.   Eyes: Negative for redness.  Respiratory: Negative for cough, chest tightness and shortness of breath.   Cardiovascular: Negative for chest pain and palpitations.       Blood pressure is elevated, likely due to stress.   Gastrointestinal: Negative for abdominal pain, constipation, diarrhea, nausea and vomiting.  Genitourinary: Negative for dysuria and frequency.  Musculoskeletal: Negative for arthralgias, back pain, joint swelling and neck pain.  Skin: Negative for rash.  Neurological:  Negative.  Negative for tremors and numbness.  Hematological: Negative for adenopathy. Does not bruise/bleed easily.  Psychiatric/Behavioral: Negative for behavioral problems (Depression), sleep disturbance and suicidal ideas. The patient is not nervous/anxious.      Today's Vitals   07/17/17 1101 07/17/17 1149  BP: (!) 180/98 (!) 158/80  Pulse: 88   Resp: 16   SpO2: 97%   Weight: 208 lb (94.3 kg)   Height: 5\' 10"  (1.778 m)     Physical Exam  Constitutional: He is oriented to person, place, and  time. He appears well-developed and well-nourished. No distress.  HENT:  Head: Normocephalic and atraumatic.  Mouth/Throat: Oropharynx is clear and moist. No oropharyngeal exudate.  Eyes: Pupils are equal, round, and reactive to light. EOM are normal.  Neck: Normal range of motion. Neck supple. No JVD present. Carotid bruit is not present. No tracheal deviation present. No thyromegaly present.  Cardiovascular: Normal rate, regular rhythm, normal heart sounds and intact distal pulses. Exam reveals no gallop and no friction rub.  No murmur heard. Pulmonary/Chest: Effort normal and breath sounds normal. No respiratory distress. He has no wheezes. He has no rales. He exhibits no tenderness.  Abdominal: Soft. Bowel sounds are normal. There is no tenderness.  Musculoskeletal:  Generalized right foot tenderness with mild swelling exertion and weight bearing increase pain. ROM and strength are slightly diminished due to pain. He does walk with mild limp, favoring the right side .  Lymphadenopathy:    He has no cervical adenopathy.  Neurological: He is alert and oriented to person, place, and time. No cranial nerve deficit.  Skin: Skin is warm and dry. He is not diaphoretic.  Psychiatric: His speech is normal and behavior is normal. Judgment and thought content normal. His mood appears anxious. Cognition and memory are normal.  Nursing note and vitals reviewed.   Assessment/Plan: 1. Encounter for general adult medical examination with abnormal findings Annual wellness visit today.  - CBC with Differential/Platelet - Comprehensive metabolic panel - Lipid panel - TSH  2. Essential hypertension Stable on current meds.  - CBC with Differential/Platelet - Comprehensive metabolic panel - Lipid panel - TSH - amLODipine (NORVASC) 5 MG tablet; Take 1 tablet (5 mg total) by mouth daily.  Dispense: 90 tablet; Refill: 3  3. Primary insomnia Samples belsomra given to patient today. Instructions  provided for proper use. Will call for prescription if effective.  - Suvorexant (BELSOMRA) 10 MG TABS; Take 10 mg by mouth at bedtime as needed.  Dispense: 30 tablet; Refill: 0  4. Inflammatory polyarthritis (HCC) - celecoxib (CELEBREX) 200 MG capsule; Take 1 capsule (200 mg total) by mouth 2 (two) times daily.  Dispense: 30 capsule; Refill: 3  5. Screening for prostate cancer -Cologuard order to be sent out.   6. Dysuria - Urinalysis, Routine w reflex microscopic  General Counseling: [place] verbalizes understanding of the findings of todays visit and agrees with plan of treatment. I have discussed any further diagnostic evaluation that may be needed or ordered today. We also reviewed his medications today. he has been encouraged to call the office with any questions or concerns that should arise related to todays visit.   Hypertension Counseling:   The following hypertensive lifestyle modification were recommended and discussed:  1. Limiting alcohol intake to less than 1 oz/day of ethanol:(24 oz of beer or 8 oz of wine or 2 oz of 100-proof whiskey). 2. Take baby ASA 81 mg daily. 3. Importance of regular aerobic exercise and losing weight. 4.  Reduce dietary saturated fat and cholesterol intake for overall cardiovascular health. 5. Maintaining adequate dietary potassium, calcium, and magnesium intake. 6. Regular monitoring of the blood pressure. 7. Reduce sodium intake to less than 100 mmol/day (less than 2.3 gm of sodium or less than 6 gm of sodium choride)   This patient was seen by Leretha Pol, FNP- C in Collaboration with Dr Lavera Guise as a part of collaborative care agreement    Orders Placed This Encounter  Procedures  . Microscopic Examination  . PSA  . CBC with Differential/Platelet  . Comprehensive metabolic panel  . Lipid panel  . TSH  . Urinalysis, Routine w reflex microscopic    Meds ordered this encounter  Medications  . Suvorexant (BELSOMRA) 10 MG TABS     Sig: Take 10 mg by mouth at bedtime as needed.    Dispense:  30 tablet    Refill:  0    Order Specific Question:   Supervising Provider    Answer:   Lavera Guise [[id number]]  . amLODipine (NORVASC) 5 MG tablet    Sig: Take 1 tablet (5 mg total) by mouth daily.    Dispense:  90 tablet    Refill:  3    Order Specific Question:   Supervising Provider    Answer:   Lavera Guise [[id number]]  . celecoxib (CELEBREX) 200 MG capsule    Sig: Take 1 capsule (200 mg total) by mouth 2 (two) times daily.    Dispense:  30 capsule    Refill:  3    Order Specific Question:   Supervising Provider    Answer:   Lavera Guise [[id number]]    Time spent: [place], MD  Internal Medicine

## 2017-07-18 LAB — URINALYSIS, ROUTINE W REFLEX MICROSCOPIC
Bilirubin, UA: NEGATIVE
Glucose, UA: NEGATIVE
Ketones, UA: NEGATIVE
Nitrite, UA: NEGATIVE
Protein, UA: NEGATIVE
RBC, UA: NEGATIVE
Specific Gravity, UA: 1.026 (ref 1.005–1.030)
Urobilinogen, Ur: 1 mg/dL (ref 0.2–1.0)
pH, UA: 5 (ref 5.0–7.5)

## 2017-07-18 LAB — MICROSCOPIC EXAMINATION
Bacteria, UA: NONE SEEN
Casts: NONE SEEN /LPF
Epithelial Cells (non renal): NONE SEEN /HPF (ref 0–10)

## 2017-08-06 DIAGNOSIS — R300 Dysuria: Secondary | ICD-10-CM | POA: Insufficient documentation

## 2017-08-06 DIAGNOSIS — Z1211 Encounter for screening for malignant neoplasm of colon: Secondary | ICD-10-CM | POA: Insufficient documentation

## 2017-08-06 DIAGNOSIS — M064 Inflammatory polyarthropathy: Secondary | ICD-10-CM | POA: Insufficient documentation

## 2017-08-06 DIAGNOSIS — Z0001 Encounter for general adult medical examination with abnormal findings: Secondary | ICD-10-CM | POA: Insufficient documentation

## 2017-08-06 DIAGNOSIS — F5101 Primary insomnia: Secondary | ICD-10-CM | POA: Insufficient documentation

## 2017-08-14 ENCOUNTER — Ambulatory Visit: Payer: Medicare Other | Admitting: Nurse Practitioner

## 2017-08-19 ENCOUNTER — Ambulatory Visit (INDEPENDENT_AMBULATORY_CARE_PROVIDER_SITE_OTHER): Payer: Medicare Other | Admitting: Nurse Practitioner

## 2017-08-19 ENCOUNTER — Encounter: Payer: Self-pay | Admitting: Nurse Practitioner

## 2017-08-19 VITALS — BP 138/76 | HR 75 | Resp 16 | Ht 70.0 in | Wt 205.6 lb

## 2017-08-19 DIAGNOSIS — K219 Gastro-esophageal reflux disease without esophagitis: Secondary | ICD-10-CM | POA: Diagnosis not present

## 2017-08-19 DIAGNOSIS — F5101 Primary insomnia: Secondary | ICD-10-CM

## 2017-08-19 DIAGNOSIS — I10 Essential (primary) hypertension: Secondary | ICD-10-CM | POA: Diagnosis not present

## 2017-08-19 DIAGNOSIS — R55 Syncope and collapse: Secondary | ICD-10-CM | POA: Insufficient documentation

## 2017-08-19 DIAGNOSIS — F32A Depression, unspecified: Secondary | ICD-10-CM | POA: Insufficient documentation

## 2017-08-19 DIAGNOSIS — M064 Inflammatory polyarthropathy: Secondary | ICD-10-CM

## 2017-08-19 DIAGNOSIS — F329 Major depressive disorder, single episode, unspecified: Secondary | ICD-10-CM | POA: Insufficient documentation

## 2017-08-19 DIAGNOSIS — Z8619 Personal history of other infectious and parasitic diseases: Secondary | ICD-10-CM | POA: Insufficient documentation

## 2017-08-19 HISTORY — DX: Depression, unspecified: F32.A

## 2017-08-19 MED ORDER — PANTOPRAZOLE SODIUM 40 MG PO TBEC: 40.0000 mg | DELAYED_RELEASE_TABLET | Freq: Every day | ORAL | 3 refills | Status: DC

## 2017-08-19 MED ORDER — SUVOREXANT 15 MG PO TABS: 15.0000 mg | ORAL_TABLET | Freq: Every evening | ORAL | 2 refills | Status: DC | PRN

## 2017-08-19 NOTE — Progress Notes (Signed)
[facility] [place], [place] [address]  Internal MEDICINE  Office Visit Note  Patient Name: [name]  [id number]  [id number]  Date of Service: 08/19/2017  Chief Complaint  Patient presents with  . Hypertension    follow up on norvasc   . Insomnia    The patient is here for routine follow up visit. He was started on amlodipine. Blood pressure is much improved. No negative side effects. He was also given samples of Belsomra 10mg  to help with insomnia. He states that this has helped with difficulty sleeping. No negative side effects were reported. He would like to try a slightly higher dose.    Pt is here for routine follow up.    Current Medication: Outpatient Encounter Medications as of 08/19/2017  Medication Sig Note  . amLODipine (NORVASC) 5 MG tablet Take 1 tablet (5 mg total) by mouth daily.   Marland Kitchen aspirin EC 81 MG tablet Take 81 mg by mouth daily.   . celecoxib (CELEBREX) 200 MG capsule Take 1 capsule (200 mg total) by mouth 2 (two) times daily.   . magnesium oxide (MAG-OX) 400 MG tablet Take 400 mg by mouth daily.   . Multiple Vitamin (MULTIVITAMIN WITH MINERALS) TABS tablet Take 1 tablet by mouth daily.   . Multiple Vitamin (MULTIVITAMIN) capsule Take by mouth.   . polyethylene glycol-electrolytes (NULYTELY/GOLYTELY) 420 g solution Take by mouth.   . Potassium 99 MG TABS Take by mouth.   . [DISCONTINUED] Suvorexant (BELSOMRA) 10 MG TABS Take 10 mg by mouth at bedtime as needed. 08/19/2017: increased dose   . pantoprazole (PROTONIX) 40 MG tablet Take 1 tablet (40 mg total) by mouth daily.   . Suvorexant (BELSOMRA) 15 MG TABS Take 15 mg by mouth at bedtime as needed.    No facility-administered encounter medications on file as of 08/19/2017.     Surgical History: Past Surgical History:  Procedure Laterality Date  . ANKLE FRACTURE SURGERY Right   . COLONOSCOPY WITH PROPOFOL N/A 12/16/2016   Procedure: COLONOSCOPY WITH PROPOFOL;  Surgeon:  Lollie Sails, MD;  Location: [facility] ENDOSCOPY;  Service: Endoscopy;  Laterality: N/A;  . COLONOSCOPY WITH PROPOFOL N/A 03/14/2017   Procedure: COLONOSCOPY WITH PROPOFOL;  Surgeon: Lollie Sails, MD;  Location: [facility] ENDOSCOPY;  Service: Endoscopy;  Laterality: N/A;  . HEMORRHOID SURGERY    . LITHOTRIPSY    . TONSILLECTOMY      Medical History: Past Medical History:  Diagnosis Date  . Alcohol abuse   . Benzodiazepine dependence ([place])   . Benzodiazepine withdrawal ([place])   . Chronic pain in right foot   . Depression 08/19/2017  . Hepatitis C   . Hypertension   . Seizures ([place]) 2010  . Sleep apnea   . Syncope    questionable vasovagal    Family History: Family History  Problem Relation Age of Onset  . Arthritis Mother   . Hypertension Mother   . Cancer Father   . Kidney disease Father   . Alcohol abuse Paternal Aunt   . Depression Maternal Grandfather     Social History   Socioeconomic History  . Marital status: Divorced    Spouse name: Not on file  . Number of children: Not on file  . Years of education: Not on file  . Highest education level: Not on file  Occupational History  . Not on file  Social Needs  . Financial resource strain: Not on file  . Food insecurity:  Worry: Not on file    Inability: Not on file  . Transportation needs:    Medical: Not on file    Non-medical: Not on file  Tobacco Use  . Smoking status: Former Smoker    Packs/day: 0.50    Types: Cigarettes    Last attempt to quit: 05/06/2017    Years since quitting: 0.2  . Smokeless tobacco: Never Used  Substance and Sexual Activity  . Alcohol use: No    Alcohol/week: 0.0 oz  . Drug use: No  . Sexual activity: Not on file  Lifestyle  . Physical activity:    Days per week: Not on file    Minutes per session: Not on file  . Stress: Not on file  Relationships  . Social connections:    Talks on phone: Not on file    Gets together: Not on file    Attends religious service: Not on  file    Active member of club or organization: Not on file    Attends meetings of clubs or organizations: Not on file    Relationship status: Not on file  . Intimate partner violence:    Fear of current or ex partner: Not on file    Emotionally abused: Not on file    Physically abused: Not on file    Forced sexual activity: Not on file  Other Topics Concern  . Not on file  Social History Narrative  . Not on file      Review of Systems  Constitutional: Negative for appetite change, chills, fatigue and unexpected weight change.  HENT: Negative for congestion, postnasal drip, rhinorrhea, sneezing and sore throat.   Eyes: Negative.  Negative for redness.  Respiratory: Negative for cough, chest tightness and shortness of breath.   Cardiovascular: Negative for chest pain and palpitations.       Blood pressure improved since starting on amlodipine at last visit.   Gastrointestinal: Negative for abdominal pain, constipation, diarrhea, nausea and vomiting.  Genitourinary: Negative for dysuria and frequency.  Musculoskeletal: Positive for arthralgias. Negative for back pain, joint swelling and neck pain.       Chronic and persistent right foot pain. Taking celebrex does take the edge off discomfort.   Skin: Negative for rash.  Neurological: Negative.  Negative for tremors and numbness.  Hematological: Negative for adenopathy. Does not bruise/bleed easily.  Psychiatric/Behavioral: Positive for sleep disturbance. Negative for behavioral problems (Depression) and suicidal ideas. The patient is not nervous/anxious.     Today's Vitals   08/19/17 0934  BP: 138/76  Pulse: 75  Resp: 16  SpO2: 92%  Weight: 205 lb 9.6 oz (93.3 kg)  Height: 5\' 10"  (1.778 m)    Physical Exam  Constitutional: He is oriented to person, place, and time. He appears well-developed and well-nourished. No distress.  HENT:  Head: Normocephalic and atraumatic.  Mouth/Throat: Oropharynx is clear and moist. No  oropharyngeal exudate.  Eyes: Pupils are equal, round, and reactive to light. EOM are normal.  Neck: Normal range of motion. Neck supple. No JVD present. Carotid bruit is not present. No tracheal deviation present. No thyromegaly present.  Cardiovascular: Normal rate, regular rhythm and normal heart sounds. Exam reveals no gallop and no friction rub.  No murmur heard. Pulmonary/Chest: Effort normal and breath sounds normal. No respiratory distress. He has no wheezes. He has no rales. He exhibits no tenderness.  Abdominal: Soft. Bowel sounds are normal. There is no tenderness.  Musculoskeletal:  Generalized right foot tenderness with mild  swelling exertion and weight bearing increase pain. ROM and strength are slightly diminished due to pain. He does walk with mild limp, favoring the right side .  Lymphadenopathy:    He has no cervical adenopathy.  Neurological: He is alert and oriented to person, place, and time. No cranial nerve deficit.  Skin: Skin is warm and dry. He is not diaphoretic.  Psychiatric: His speech is normal and behavior is normal. Judgment and thought content normal. His mood appears anxious. Cognition and memory are normal.  Nursing note and vitals reviewed.   Assessment/Plan: 1. Essential hypertension Stable. Continue amlodipine as prescribed   2. Primary insomnia Increase belsomra to 15mg  QHS PRN. New prescription sent to pharmacy. - Suvorexant (BELSOMRA) 15 MG TABS; Take 15 mg by mouth at bedtime as needed.  Dispense: 30 tablet; Refill: 2  3. Inflammatory polyarthritis (HCC) contineu celebrex 200mg  QD   4. Gastroesophageal reflux disease without esophagitis - pantoprazole (PROTONIX) 40 MG tablet; Take 1 tablet (40 mg total) by mouth daily.  Dispense: 30 tablet; Refill: 3   General Counseling: [place] verbalizes understanding of the findings of todays visit and agrees with plan of treatment. I have discussed any further diagnostic evaluation that may be needed or  ordered today. We also reviewed his medications today. he has been encouraged to call the office with any questions or concerns that should arise related to todays visit.   This patient was seen by Leretha Pol, FNP- C in Collaboration with Dr Lavera Guise as a part of collaborative care agreement     Meds ordered this encounter  Medications  . pantoprazole (PROTONIX) 40 MG tablet    Sig: Take 1 tablet (40 mg total) by mouth daily.    Dispense:  30 tablet    Refill:  3    Order Specific Question:   Supervising Provider    Answer:   Lavera Guise [[id number]]  . Suvorexant (BELSOMRA) 15 MG TABS    Sig: Take 15 mg by mouth at bedtime as needed.    Dispense:  30 tablet    Refill:  2    Order Specific Question:   Supervising Provider    Answer:   Lavera Guise [[id number]]    Time spent: 69 Minutes      Dr Lavera Guise Internal medicine

## 2017-11-20 ENCOUNTER — Ambulatory Visit (INDEPENDENT_AMBULATORY_CARE_PROVIDER_SITE_OTHER): Payer: Medicare Other | Admitting: Nurse Practitioner

## 2017-11-20 ENCOUNTER — Encounter (INDEPENDENT_AMBULATORY_CARE_PROVIDER_SITE_OTHER): Payer: Self-pay

## 2017-11-20 ENCOUNTER — Encounter: Payer: Self-pay | Admitting: Nurse Practitioner

## 2017-11-20 VITALS — BP 158/90 | HR 61 | Resp 16 | Ht 70.0 in | Wt 208.2 lb

## 2017-11-20 DIAGNOSIS — I10 Essential (primary) hypertension: Secondary | ICD-10-CM

## 2017-11-20 DIAGNOSIS — B370 Candidal stomatitis: Secondary | ICD-10-CM

## 2017-11-20 DIAGNOSIS — F5101 Primary insomnia: Secondary | ICD-10-CM | POA: Diagnosis not present

## 2017-11-20 DIAGNOSIS — M064 Inflammatory polyarthropathy: Secondary | ICD-10-CM

## 2017-11-20 MED ORDER — NYSTATIN 100000 UNIT/ML MT SUSP: 5.0000 mL | Freq: Four times a day (QID) | OROMUCOSAL | 1 refills | Status: DC

## 2017-11-20 MED ORDER — AMLODIPINE BESYLATE 5 MG PO TABS: 5.0000 mg | ORAL_TABLET | Freq: Every day | ORAL | 3 refills | Status: DC

## 2017-11-20 MED ORDER — FLUCONAZOLE 150 MG PO TABS: ORAL_TABLET | ORAL | 1 refills | Status: DC

## 2017-11-20 MED ORDER — SUVOREXANT 20 MG PO TABS: 20.0000 | ORAL_TABLET | Freq: Every evening | ORAL | 3 refills | Status: DC | PRN

## 2017-11-20 NOTE — Progress Notes (Signed)
[facility] [place], [place] [address]  Internal MEDICINE  Office Visit Note  Patient Name: [name]  [id number]  [id number]  Date of Service: 12/17/2017   Pt is here for routine follow up.   Chief Complaint  Patient presents with  . Hypertension  . Insomnia    The patient is having problem with plaques of thrush on his tongue. Has been going on since he had teeth removed and partial placed. Dentist did prescribe him nystatin rinse but this is not helping.   Hypertension  This is a chronic problem. The current episode started more than 1 year ago. The problem has been waxing and waning since onset. The problem is controlled. Associated symptoms include headaches. Pertinent negatives include no chest pain, neck pain, palpitations or shortness of breath. There are no associated agents to hypertension. Risk factors for coronary artery disease include male gender, family history and dyslipidemia. Past treatments include central alpha agonists. The current treatment provides moderate improvement. Compliance problems include psychosocial issues.   Insomnia  Primary symptoms: sleep disturbance, difficulty falling asleep, frequent awakening.  The onset quality is gradual. The problem occurs every several days. The problem is unchanged. The symptoms are aggravated by anxiety. How many beverages per day that contain caffeine: 0 - 1.  The symptoms are relieved by medication and relaxation. Past treatments include medication. The treatment provided mild relief. PMH includes: hypertension, depression, family stress or anxiety. Prior diagnostic workup includes:  Blood work and polysomnogram.       Current Medication: Outpatient Encounter Medications as of 11/20/2017  Medication Sig  . amLODipine (NORVASC) 5 MG tablet Take 1 tablet (5 mg total) by mouth daily.  . magnesium oxide (MAG-OX) 400 MG tablet Take 400 mg by mouth daily.  . Multiple Vitamin  (MULTIVITAMIN WITH MINERALS) TABS tablet Take 1 tablet by mouth daily.  . Multiple Vitamin (MULTIVITAMIN) capsule Take by mouth.  . pantoprazole (PROTONIX) 40 MG tablet Take 1 tablet (40 mg total) by mouth daily.  . [DISCONTINUED] amLODipine (NORVASC) 5 MG tablet Take 1 tablet (5 mg total) by mouth daily.  . [DISCONTINUED] Suvorexant (BELSOMRA) 15 MG TABS Take 15 mg by mouth at bedtime as needed.  Marland Kitchen aspirin EC 81 MG tablet Take 81 mg by mouth daily.  . celecoxib (CELEBREX) 200 MG capsule Take 1 capsule (200 mg total) by mouth 2 (two) times daily. (Patient not taking: Reported on 11/20/2017)  . fluconazole (DIFLUCAN) 150 MG tablet Take 1 tablet po once. May repeat dose in 3 days as needed for persistent symptoms.  Marland Kitchen nystatin (MYCOSTATIN) 100000 UNIT/ML suspension Take 5 mLs (500,000 Units total) by mouth 4 (four) times daily.  . polyethylene glycol-electrolytes (NULYTELY/GOLYTELY) 420 g solution Take by mouth.  . Potassium 99 MG TABS Take by mouth.  . Suvorexant (BELSOMRA) 20 MG TABS Take 20 tablets by mouth at bedtime as needed.   No facility-administered encounter medications on file as of 11/20/2017.     Surgical History: Past Surgical History:  Procedure Laterality Date  . ANKLE FRACTURE SURGERY Right   . COLONOSCOPY WITH PROPOFOL N/A 12/16/2016   Procedure: COLONOSCOPY WITH PROPOFOL;  Surgeon: Lollie Sails, MD;  Location[facility] ENDOSCOPY;  Service: Endoscopy;  Laterality: N/A;  . COLONOSCOPY WITH PROPOFOL N/A 03/14/2017   Procedure: COLONOSCOPY WITH PROPOFOL;  Surgeon: Lollie Sails, MD;  Location: [facility] ENDOSCOPY;  Service: Endoscopy;  Laterality: N/A;  . HEMORRHOID SURGERY    . LITHOTRIPSY    .  TONSILLECTOMY      Medical History: Past Medical History:  Diagnosis Date  . Alcohol abuse   . Benzodiazepine dependence ([place])   . Benzodiazepine withdrawal ([place])   . Chronic pain in right foot   . Depression 08/19/2017  . Hepatitis C   . Hypertension   . Seizures ([place]) 2010  .  Sleep apnea   . Syncope    questionable vasovagal    Family History: Family History  Problem Relation Age of Onset  . Arthritis Mother   . Hypertension Mother   . Cancer Father   . Kidney disease Father   . Alcohol abuse Paternal Aunt   . Depression Maternal Grandfather     Social History   Socioeconomic History  . Marital status: Divorced    Spouse name: Not on file  . Number of children: Not on file  . Years of education: Not on file  . Highest education level: Not on file  Occupational History  . Not on file  Social Needs  . Financial resource strain: Not on file  . Food insecurity:    Worry: Not on file    Inability: Not on file  . Transportation needs:    Medical: Not on file    Non-medical: Not on file  Tobacco Use  . Smoking status: Former Smoker    Packs/day: 0.50    Types: Cigarettes    Last attempt to quit: 05/06/2017    Years since quitting: 0.6  . Smokeless tobacco: Never Used  Substance and Sexual Activity  . Alcohol use: No    Alcohol/week: 0.0 oz  . Drug use: No  . Sexual activity: Not on file  Lifestyle  . Physical activity:    Days per week: Not on file    Minutes per session: Not on file  . Stress: Not on file  Relationships  . Social connections:    Talks on phone: Not on file    Gets together: Not on file    Attends religious service: Not on file    Active member of club or organization: Not on file    Attends meetings of clubs or organizations: Not on file    Relationship status: Not on file  . Intimate partner violence:    Fear of current or ex partner: Not on file    Emotionally abused: Not on file    Physically abused: Not on file    Forced sexual activity: Not on file  Other Topics Concern  . Not on file  Social History Narrative  . Not on file      Review of Systems  Constitutional: Negative for appetite change, chills, fatigue and unexpected weight change.  HENT: Negative for congestion, postnasal drip, rhinorrhea,  sneezing and sore throat.   Eyes: Negative.  Negative for redness.  Respiratory: Negative for cough, chest tightness and shortness of breath.   Cardiovascular: Negative for chest pain and palpitations.       Blood pressure elevated today. Has been out of amlodipine for a few days.  Gastrointestinal: Negative for abdominal pain, constipation, diarrhea, nausea and vomiting.  Endocrine: Negative for cold intolerance, heat intolerance, polydipsia, polyphagia and polyuria.  Genitourinary: Negative.  Negative for dysuria and frequency.  Musculoskeletal: Positive for arthralgias. Negative for back pain, joint swelling and neck pain.       Chronic and persistent right foot pain. Taking celebrex does take the edge off discomfort.   Skin: Negative for rash.  Allergic/Immunologic: Negative for environmental allergies.  Neurological: Positive for headaches. Negative for tremors and numbness.  Hematological: Negative for adenopathy. Does not bruise/bleed easily.  Psychiatric/Behavioral: Positive for depression and sleep disturbance. Negative for behavioral problems (Depression) and suicidal ideas. The patient has insomnia. The patient is not nervous/anxious.     Today's Vitals   11/20/17 1511  BP: (!) 158/90  Pulse: 61  Resp: 16  SpO2: 96%  Weight: 208 lb 3.2 oz (94.4 kg)  Height: 5\' 10"  (1.778 m)    Physical Exam  Constitutional: He is oriented to person, place, and time. He appears well-developed and well-nourished. No distress.  HENT:  Head: Normocephalic and atraumatic.  Mouth/Throat: Oropharyngeal exudate present.  Plaques noted on the tongue.  Eyes: Pupils are equal, round, and reactive to light. EOM are normal.  Neck: Normal range of motion. Neck supple. No JVD present. Carotid bruit is not present. No tracheal deviation present. No thyromegaly present.  Cardiovascular: Normal rate, regular rhythm and normal heart sounds. Exam reveals no gallop and no friction rub.  No murmur  heard. Pulmonary/Chest: Effort normal and breath sounds normal. No respiratory distress. He has no wheezes. He has no rales. He exhibits no tenderness.  Abdominal: Soft. Bowel sounds are normal. There is no tenderness.  Musculoskeletal:  Generalized right foot tenderness with mild swelling exertion and weight bearing increase pain. ROM and strength are slightly diminished due to pain. He does walk with mild limp, favoring the right side .  Lymphadenopathy:    He has no cervical adenopathy.  Neurological: He is alert and oriented to person, place, and time. No cranial nerve deficit.  Skin: Skin is warm and dry. He is not diaphoretic.  Psychiatric: His speech is normal and behavior is normal. Judgment and thought content normal. His mood appears anxious. Cognition and memory are normal.  Nursing note and vitals reviewed.  Assessment/Plan: 1. Essential hypertension Generally stable. Continue amlodipine 5mg  daily. New rx provided to pharmacy.  - amLODipine (NORVASC) 5 MG tablet; Take 1 tablet (5 mg total) by mouth daily.  Dispense: 90 tablet; Refill: 3  2. Primary insomnia Improved with belsomra 20mg  every night as needed.  - Suvorexant (BELSOMRA) 20 MG TABS; Take 20 tablets by mouth at bedtime as needed.  Dispense: 30 tablet; Refill: 3  3. Thrush, oral - nystatin (MYCOSTATIN) 100000 UNIT/ML suspension; Take 5 mLs (500,000 Units total) by mouth 4 (four) times daily.  Dispense: 200 mL; Refill: 1 - fluconazole (DIFLUCAN) 150 MG tablet; Take 1 tablet po once. May repeat dose in 3 days as needed for persistent symptoms.  Dispense: 3 tablet; Refill: 1  4. Inflammatory polyarthritis (HCC)  Continue celebrex 200mg  twice daily as needed.   General Counseling: [name] understanding of the findings of todays visit and agrees with plan of treatment. I have discussed any further diagnostic evaluation that may be needed or ordered today. We also reviewed his medications today. he has been  encouraged to call the office with any questions or concerns that should arise related to todays visit.    Counseling:  Hypertension Counseling:   The following hypertensive lifestyle modification were recommended and discussed:  1. Limiting alcohol intake to less than 1 oz/day of ethanol:(24 oz of beer or 8 oz of wine or 2 oz of 100-proof whiskey). 2. Take baby ASA 81 mg daily. 3. Importance of regular aerobic exercise and losing weight. 4. Reduce dietary saturated fat and cholesterol intake for overall cardiovascular health. 5. Maintaining adequate dietary potassium, calcium, and magnesium intake. 6. Regular monitoring  of the blood pressure. 7. Reduce sodium intake to less than 100 mmol/day (less than 2.3 gm of sodium or less than 6 gm of sodium choride)   This patient was seen by [place] with Dr Lavera Guise as a part of collaborative care agreement    Meds ordered this encounter  Medications  . amLODipine (NORVASC) 5 MG tablet    Sig: Take 1 tablet (5 mg total) by mouth daily.    Dispense:  90 tablet    Refill:  3    Order Specific Question:   Supervising Provider    Answer:   Lavera Guise [[id number]]  . Suvorexant (BELSOMRA) 20 MG TABS    Sig: Take 20 tablets by mouth at bedtime as needed.    Dispense:  30 tablet    Refill:  3    Order Specific Question:   Supervising Provider    Answer:   Lavera Guise [[id number]]  . nystatin (MYCOSTATIN) 100000 UNIT/ML suspension    Sig: Take 5 mLs (500,000 Units total) by mouth 4 (four) times daily.    Dispense:  200 mL    Refill:  1    Order Specific Question:   Supervising Provider    Answer:   Lavera Guise [[id number]]  . fluconazole (DIFLUCAN) 150 MG tablet    Sig: Take 1 tablet po once. May repeat dose in 3 days as needed for persistent symptoms.    Dispense:  3 tablet    Refill:  1    Order Specific Question:   Supervising Provider    Answer:   Lavera Guise [[id number]]    Time spent: 54 Minutes     Dr Lavera Guise Internal medicine

## 2017-12-17 DIAGNOSIS — B370 Candidal stomatitis: Secondary | ICD-10-CM | POA: Insufficient documentation

## 2018-02-02 ENCOUNTER — Other Ambulatory Visit: Payer: Self-pay | Admitting: *Deleted

## 2018-02-02 NOTE — Patient Outreach (Signed)
[facility]) Care Management  02/02/2018  [name] January 20, 1951 [id number]   opened in error  Spoke with this pt who is the incorrect pt   Anthony Fabela L. Lavina Hamman, RN, BSN, [place] Coordinator Office number [phone] Mobile number [phone]  Main [facility] number [phone] Fax number [phone]

## 2018-02-06 ENCOUNTER — Other Ambulatory Visit: Payer: Self-pay

## 2018-02-06 DIAGNOSIS — I10 Essential (primary) hypertension: Secondary | ICD-10-CM

## 2018-02-06 MED ORDER — AMLODIPINE BESYLATE 5 MG PO TABS: 5.0000 mg | ORAL_TABLET | Freq: Every day | ORAL | 3 refills | Status: DC

## 2018-02-24 ENCOUNTER — Ambulatory Visit: Payer: Self-pay | Admitting: Nurse Practitioner

## 2018-02-26 ENCOUNTER — Telehealth: Payer: Self-pay

## 2018-02-26 ENCOUNTER — Other Ambulatory Visit: Payer: Self-pay | Admitting: Nurse Practitioner

## 2018-02-26 DIAGNOSIS — I10 Essential (primary) hypertension: Secondary | ICD-10-CM | POA: Diagnosis not present

## 2018-02-26 DIAGNOSIS — Z0001 Encounter for general adult medical examination with abnormal findings: Secondary | ICD-10-CM | POA: Diagnosis not present

## 2018-02-26 DIAGNOSIS — E782 Mixed hyperlipidemia: Secondary | ICD-10-CM | POA: Diagnosis not present

## 2018-02-26 NOTE — Telephone Encounter (Signed)
Pt came in today to pick up lab slip to get blood work done before his appointment next Tuesday, 03/03/18

## 2018-02-27 LAB — COMPREHENSIVE METABOLIC PANEL
ALBUMIN: 4.9 g/dL — AB (ref 3.6–4.8)
ALT: 41 IU/L (ref 0–44)
AST: 41 IU/L — ABNORMAL HIGH (ref 0–40)
Albumin/Globulin Ratio: 1.5 (ref 1.2–2.2)
Alkaline Phosphatase: 71 IU/L (ref 39–117)
BUN / CREAT RATIO: 10 (ref 10–24)
BUN: 12 mg/dL (ref 8–27)
Bilirubin Total: 0.6 mg/dL (ref 0.0–1.2)
CHLORIDE: 97 mmol/L (ref 96–106)
CO2: 24 mmol/L (ref 20–29)
Calcium: 9.8 mg/dL (ref 8.6–10.2)
Creatinine, Ser: 1.15 mg/dL (ref 0.76–1.27)
GFR calc non Af Amer: 66 mL/min/{1.73_m2} (ref >59)
GFR, EST AFRICAN AMERICAN: 77 mL/min/{1.73_m2} (ref >59)
GLOBULIN, TOTAL: 3.2 g/dL (ref 1.5–4.5)
GLUCOSE: 113 mg/dL — AB (ref 65–99)
Potassium: 4.6 mmol/L (ref 3.5–5.2)
SODIUM: 139 mmol/L (ref 134–144)
TOTAL PROTEIN: 8.1 g/dL (ref 6.0–8.5)

## 2018-02-27 LAB — CBC
HEMATOCRIT: 53 % — AB (ref 37.5–51.0)
Hemoglobin: 18 g/dL — ABNORMAL HIGH (ref 13.0–17.7)
MCH: 31.6 pg (ref 26.6–33.0)
MCHC: 34 g/dL (ref 31.5–35.7)
MCV: 93 fL (ref 79–97)
Platelets: 217 10*3/uL (ref 150–450)
RBC: 5.69 x10E6/uL (ref 4.14–5.80)
RDW: 13.6 % (ref 12.3–15.4)
WBC: 9.6 10*3/uL (ref 3.4–10.8)

## 2018-02-27 LAB — LIPID PANEL W/O CHOL/HDL RATIO
Cholesterol, Total: 224 mg/dL — ABNORMAL HIGH (ref 100–199)
HDL: 59 mg/dL (ref >39)
LDL Calculated: 143 mg/dL — ABNORMAL HIGH (ref 0–99)
TRIGLYCERIDES: 109 mg/dL (ref 0–149)
VLDL Cholesterol Cal: 22 mg/dL (ref 5–40)

## 2018-02-27 LAB — T4, FREE: Free T4: 1.13 ng/dL (ref 0.82–1.77)

## 2018-02-27 LAB — PSA: Prostate Specific Ag, Serum: 0.3 ng/mL (ref 0.0–4.0)

## 2018-02-27 LAB — T3: T3 TOTAL: 91 ng/dL (ref 71–180)

## 2018-02-27 LAB — TSH: TSH: 1.46 u[IU]/mL (ref 0.450–4.500)

## 2018-03-03 ENCOUNTER — Ambulatory Visit (INDEPENDENT_AMBULATORY_CARE_PROVIDER_SITE_OTHER): Payer: Medicare Other | Admitting: Nurse Practitioner

## 2018-03-03 ENCOUNTER — Encounter: Payer: Self-pay | Admitting: Nurse Practitioner

## 2018-03-03 VITALS — BP 149/84 | HR 71 | Resp 16 | Ht 69.75 in | Wt 218.2 lb

## 2018-03-03 DIAGNOSIS — E782 Mixed hyperlipidemia: Secondary | ICD-10-CM

## 2018-03-03 DIAGNOSIS — F5101 Primary insomnia: Secondary | ICD-10-CM | POA: Diagnosis not present

## 2018-03-03 DIAGNOSIS — E1169 Type 2 diabetes mellitus with other specified complication: Secondary | ICD-10-CM | POA: Insufficient documentation

## 2018-03-03 DIAGNOSIS — I10 Essential (primary) hypertension: Secondary | ICD-10-CM

## 2018-03-03 NOTE — Progress Notes (Signed)
[facility] [place], [place] [address]  Internal MEDICINE  Office Visit Note  Patient Name: [name]  [id number]  [id number]  Date of Service: 03/03/2018  Chief Complaint  Patient presents with  . Medical Management of Chronic Issues    3 month follow up  . Hypertension  . Sleep Apnea  . Depression    The patient is having a little depression. Recently lost his father. Father had been ill. Passed away in Hospice care. Mother is now in retirement community with skilled nursing available as needed.  He has had his routine, fasting labs drawn. Results discussed with patient today. Has mild elevation of LDL and total cholesterol. Otherwise, his labs looked good. Blood pressure is relatively well managed.   Hypertension  This is a chronic problem. The current episode started more than 1 year ago. The problem is unchanged. The problem is resistant. Associated symptoms include headaches and peripheral edema. Pertinent negatives include no chest pain, neck pain, palpitations or shortness of breath. Agents associated with hypertension include NSAIDs. Risk factors for coronary artery disease include male gender, family history and stress. Past treatments include calcium channel blockers. The current treatment provides moderate improvement. Compliance problems include exercise and psychosocial issues.   Depression         This is a chronic problem.  The current episode started more than 1 year ago.   The onset quality is gradual.   The problem has been gradually improving since onset.  Associated symptoms include headaches.  Associated symptoms include no fatigue, no appetite change and no suicidal ideas.     The symptoms are aggravated by family issues.  Past treatments include SSRIs - Selective serotonin reuptake inhibitors and SNRIs - Serotonin and norepinephrine reuptake inhibitors.  Compliance with treatment is variable.  Past compliance problems include  difficulty with treatment plan.  Previous treatment provided mild relief.  Risk factors include major life event (father recently passed away - 03/11/2018).   Past medical history includes mental health disorder.        Current Medication: Outpatient Encounter Medications as of 03/03/2018  Medication Sig  . amLODipine (NORVASC) 5 MG tablet Take 1 tablet (5 mg total) by mouth daily.  Marland Kitchen aspirin EC 81 MG tablet Take 81 mg by mouth daily.  . Multiple Vitamin (MULTIVITAMIN WITH MINERALS) TABS tablet Take 1 tablet by mouth daily.  . Multiple Vitamin (MULTIVITAMIN) capsule Take by mouth.  . Suvorexant (BELSOMRA) 20 MG TABS Take 20 tablets by mouth at bedtime as needed.  . celecoxib (CELEBREX) 200 MG capsule Take 1 capsule (200 mg total) by mouth 2 (two) times daily. (Patient not taking: Reported on 11/20/2017)  . fluconazole (DIFLUCAN) 150 MG tablet Take 1 tablet po once. May repeat dose in 3 days as needed for persistent symptoms. (Patient not taking: Reported on 03/03/2018)  . magnesium oxide (MAG-OX) 400 MG tablet Take 400 mg by mouth daily.  Marland Kitchen nystatin (MYCOSTATIN) 100000 UNIT/ML suspension Take 5 mLs (500,000 Units total) by mouth 4 (four) times daily. (Patient not taking: Reported on 03/03/2018)  . pantoprazole (PROTONIX) 40 MG tablet Take 1 tablet (40 mg total) by mouth daily. (Patient not taking: Reported on 03/03/2018)  . polyethylene glycol-electrolytes (NULYTELY/GOLYTELY) 420 g solution Take by mouth.  . Potassium 99 MG TABS Take by mouth.   No facility-administered encounter medications on file as of 03/03/2018.     Surgical History: Past Surgical History:  Procedure Laterality Date  .  ANKLE FRACTURE SURGERY Right   . COLONOSCOPY WITH PROPOFOL N/A 12/16/2016   Procedure: COLONOSCOPY WITH PROPOFOL;  Surgeon: Lollie Sails, MD;  Location: [facility] ENDOSCOPY;  Service: Endoscopy;  Laterality: N/A;  . COLONOSCOPY WITH PROPOFOL N/A 03/14/2017   Procedure: COLONOSCOPY WITH PROPOFOL;   Surgeon: Lollie Sails, MD;  Location: [facility] ENDOSCOPY;  Service: Endoscopy;  Laterality: N/A;  . HEMORRHOID SURGERY    . LITHOTRIPSY    . TONSILLECTOMY      Medical History: Past Medical History:  Diagnosis Date  . Alcohol abuse   . Benzodiazepine dependence ([place])   . Benzodiazepine withdrawal ([place])   . Chronic pain in right foot   . Depression 08/19/2017  . Hepatitis C   . Hypertension   . Seizures ([place]) 2010  . Sleep apnea   . Syncope    questionable vasovagal    Family History: Family History  Problem Relation Age of Onset  . Arthritis Mother   . Hypertension Mother   . Cancer Father   . Kidney disease Father   . Alcohol abuse Paternal Aunt   . Depression Maternal Grandfather     Social History   Socioeconomic History  . Marital status: Divorced    Spouse name: Not on file  . Number of children: Not on file  . Years of education: Not on file  . Highest education level: Not on file  Occupational History  . Not on file  Social Needs  . Financial resource strain: Not on file  . Food insecurity:    Worry: Not on file    Inability: Not on file  . Transportation needs:    Medical: Not on file    Non-medical: Not on file  Tobacco Use  . Smoking status: Former Smoker    Packs/day: 0.50    Types: Cigarettes    Last attempt to quit: 05/06/2017    Years since quitting: 0.8  . Smokeless tobacco: Never Used  Substance and Sexual Activity  . Alcohol use: No    Alcohol/week: 0.0 standard drinks  . Drug use: No  . Sexual activity: Not on file  Lifestyle  . Physical activity:    Days per week: Not on file    Minutes per session: Not on file  . Stress: Not on file  Relationships  . Social connections:    Talks on phone: Not on file    Gets together: Not on file    Attends religious service: Not on file    Active member of club or organization: Not on file    Attends meetings of clubs or organizations: Not on file    Relationship status: Not on file  .  Intimate partner violence:    Fear of current or ex partner: Not on file    Emotionally abused: Not on file    Physically abused: Not on file    Forced sexual activity: Not on file  Other Topics Concern  . Not on file  Social History Narrative  . Not on file      Review of Systems  Constitutional: Negative for appetite change, chills, fatigue and unexpected weight change.  HENT: Negative for congestion, postnasal drip, rhinorrhea, sneezing and sore throat.   Eyes: Negative.  Negative for redness.  Respiratory: Negative for cough, chest tightness, shortness of breath and wheezing.   Cardiovascular: Negative for chest pain and palpitations.  Gastrointestinal: Negative for abdominal pain, constipation, diarrhea, nausea and vomiting.  Endocrine: Negative for cold intolerance, heat intolerance, polydipsia,  polyphagia and polyuria.  Genitourinary: Negative.  Negative for dysuria and frequency.  Musculoskeletal: Positive for arthralgias. Negative for back pain, joint swelling and neck pain.       Chronic and persistent right foot pain. Stopped taking celebrex. States that this stopped working. Feels like nothing really helps for long periods of time.   Skin: Negative for rash.  Allergic/Immunologic: Negative for environmental allergies.  Neurological: Positive for headaches. Negative for tremors and numbness.  Hematological: Negative for adenopathy. Does not bruise/bleed easily.  Psychiatric/Behavioral: Positive for depression, dysphoric mood and sleep disturbance. Negative for behavioral problems (Depression) and suicidal ideas. The patient is not nervous/anxious.     Today's Vitals   03/03/18 0855  BP: (!) 149/84  Pulse: 71  Resp: 16  SpO2: 93%  Weight: 218 lb 3.2 oz (99 kg)  Height: 5' 9.75" (1.772 m)    Physical Exam  Constitutional: He is oriented to person, place, and time. He appears well-developed and well-nourished. No distress.  HENT:  Head: Normocephalic and  atraumatic.  Mouth/Throat: Oropharynx is clear and moist. No oropharyngeal exudate.  Eyes: Pupils are equal, round, and reactive to light. EOM are normal.  Neck: Normal range of motion. Neck supple. No JVD present. Carotid bruit is not present. No tracheal deviation present. No thyromegaly present.  Cardiovascular: Normal rate, regular rhythm and normal heart sounds. Exam reveals no gallop and no friction rub.  No murmur heard. Pulmonary/Chest: Effort normal and breath sounds normal. No respiratory distress. He has no wheezes. He has no rales. He exhibits no tenderness.  Abdominal: Soft. Bowel sounds are normal. There is no tenderness.  Musculoskeletal:  Generalized right foot tenderness with mild swelling exertion and weight bearing increase pain. ROM and strength are slightly diminished due to pain. He does walk with mild limp, favoring the right side .  Lymphadenopathy:    He has no cervical adenopathy.  Neurological: He is alert and oriented to person, place, and time. No cranial nerve deficit.  Skin: Skin is warm and dry. He is not diaphoretic.  Psychiatric: His speech is normal and behavior is normal. Judgment and thought content normal. His mood appears anxious. Cognition and memory are normal.  Nursing note and vitals reviewed.  Assessment/Plan: 1. Essential hypertension Stable. Continue bp medication as prescribed   2. Primary insomnia May continue to take belsomra at bedtime as needed. Refills to be provided as needed  3. Mixed hyperlipidemia Reviewed labs showing mild elevation of LDL and total cholesterol. Prudent, heart healthy diet recommended.   General Counseling: [email or web address] mindel understanding of the findings of todays visit and agrees with plan of treatment. I have discussed any further diagnostic evaluation that may be needed or ordered today. We also reviewed his medications today. he has been encouraged to call the office with any questions or concerns that should  arise related to todays visit.  Cardiac risk factor modification:  1. Control blood pressure. 2. Exercise as prescribed. 3. Follow low sodium, low fat diet. and low fat and low cholestrol diet. 4. Take ASA 81mg  once a day. 5. Restricted calories diet to lose weight.  This patient was seen by Leretha Pol FNP Collaboration with Dr Lavera Guise as a part of collaborative care agreement   Time spent: 25 Minutes      Dr Lavera Guise Internal medicine

## 2018-04-30 ENCOUNTER — Other Ambulatory Visit: Payer: Self-pay

## 2018-04-30 DIAGNOSIS — B370 Candidal stomatitis: Secondary | ICD-10-CM

## 2018-04-30 MED ORDER — FLUCONAZOLE 150 MG PO TABS: ORAL_TABLET | ORAL | 1 refills | Status: DC

## 2018-06-03 ENCOUNTER — Encounter: Payer: Self-pay | Admitting: Adult Health

## 2018-06-03 ENCOUNTER — Ambulatory Visit (INDEPENDENT_AMBULATORY_CARE_PROVIDER_SITE_OTHER): Payer: Medicare HMO | Admitting: Adult Health

## 2018-06-03 ENCOUNTER — Other Ambulatory Visit: Payer: Self-pay

## 2018-06-03 ENCOUNTER — Other Ambulatory Visit: Payer: Self-pay | Admitting: Nurse Practitioner

## 2018-06-03 ENCOUNTER — Other Ambulatory Visit: Payer: Self-pay | Admitting: Adult Health

## 2018-06-03 VITALS — BP 174/97 | HR 68 | Temp 97.5°F | Resp 16 | Ht 69.0 in | Wt 213.4 lb

## 2018-06-03 DIAGNOSIS — B370 Candidal stomatitis: Secondary | ICD-10-CM | POA: Diagnosis not present

## 2018-06-03 DIAGNOSIS — I10 Essential (primary) hypertension: Secondary | ICD-10-CM | POA: Diagnosis not present

## 2018-06-03 MED ORDER — NYSTATIN 100000 UNIT/ML MT SUSP: 5.0000 mL | Freq: Four times a day (QID) | OROMUCOSAL | 1 refills | Status: DC

## 2018-06-03 MED ORDER — ITRACONAZOLE 10 MG/ML PO SOLN: 200.0000 mg | Freq: Every day | ORAL | 0 refills | Status: DC

## 2018-06-03 NOTE — Patient Instructions (Signed)
Oral Thrush, Adult  Oral thrush is an infection in your mouth and throat. It causes white patches on your tongue and in your mouth. Follow these instructions at home: Helping with soreness   To lessen your pain: ? Drink cold liquids, like water and iced tea. ? Eat frozen ice pops or frozen juices. ? Eat foods that are easy to swallow, like gelatin and ice cream. ? Drink from a straw if the patches in your mouth are painful. General instructions  Take or use over-the-counter and prescription medicines only as told by your doctor. Medicine for oral thrush may be something to swallow, or it may be something to put on the infected area.  Eat plain yogurt that has live cultures in it. Read the label to make sure.  If you wear dentures: ? Take out your dentures before you go to bed. ? Brush them well. ? Soak them in a denture cleaner.  Rinse your mouth with warm salt-water many times a day. To make the salt-water mixture, completely dissolve 1/2-1 teaspoon of salt in 1 cup of warm water. Contact a doctor if:  Your problems are getting worse.  Your problems do not get better in less than 7 days with treatment.  Your infection is spreading. This may show as white patches on the skin outside of your mouth.  You are nursing your baby and you have redness and pain in the nipples. This information is not intended to replace advice given to you by your health care provider. Make sure you discuss any questions you have with your health care provider. Document Released: 07/24/2009 Document Revised: 01/22/2016 Document Reviewed: 01/22/2016 Elsevier Interactive Patient Education  2019 Elsevier Inc.  

## 2018-06-03 NOTE — Progress Notes (Signed)
[facility] [place], [place] [address]  Internal MEDICINE  Office Visit Note  Patient Name: [name]  [id number]  [id number]  Date of Service: 06/03/2018  Chief Complaint  Patient presents with  . Thrush    ongoing  issue      HPI Pt is here for a sick visit.  He complains of thrush in his mouth that has been persistent for the past 11 months.  He has taken 2 courses of Diflucan with minimal success.  He has recently started taking multiple over-the-counter medications as well as some herbal supplements that are supposed to help with overgrowth of yeast.  Listed in his medications.  This seems to have helped some however patient continues to report candidiasis of his oral cavity.  Patient reports he has seen his dentist for multiple therapies and nothing appears to be working at this time.  Patient denies any history of HIV infection, denies any risky behaviors or sexual contact in the last 10 years.     Current Medication:  Outpatient Encounter Medications as of 06/03/2018  Medication Sig  . amLODipine (NORVASC) 5 MG tablet Take 1 tablet (5 mg total) by mouth daily.  Marland Kitchen antiseptic oral rinse (BIOTENE) LIQD 15 mLs by Mouth Rinse route as needed for dry mouth.  Marland Kitchen aspirin EC 81 MG tablet Take 81 mg by mouth daily.  . Multiple Vitamin (MULTIVITAMIN WITH MINERALS) TABS tablet Take 1 tablet by mouth daily.  Marland Kitchen nystatin (MYCOSTATIN) 100000 UNIT/ML suspension Take 5 mLs (500,000 Units total) by mouth 4 (four) times daily.  . Oreg-Peppermint-Thyme-Gldnseal (YEAST FORMULA PO) Take by mouth. Yeast cleanse  6 pills a day  . fluconazole (DIFLUCAN) 150 MG tablet Take 1 tablet po once. May repeat dose in 3 days as needed for persistent symptoms. (Patient not taking: Reported on 06/03/2018)  . itraconazole (SPORANOX) 10 MG/ML solution Take 20 mLs (200 mg total) by mouth daily.  . [DISCONTINUED] celecoxib (CELEBREX) 200 MG capsule Take 1 capsule (200 mg total)  by mouth 2 (two) times daily. (Patient not taking: Reported on 11/20/2017)  . [DISCONTINUED] magnesium oxide (MAG-OX) 400 MG tablet Take 400 mg by mouth daily.  . [DISCONTINUED] Multiple Vitamin (MULTIVITAMIN) capsule Take by mouth.  . [DISCONTINUED] nystatin (MYCOSTATIN) 100000 UNIT/ML suspension Take 5 mLs (500,000 Units total) by mouth 4 (four) times daily. (Patient not taking: Reported on 03/03/2018)  . [DISCONTINUED] pantoprazole (PROTONIX) 40 MG tablet Take 1 tablet (40 mg total) by mouth daily. (Patient not taking: Reported on 03/03/2018)  . [DISCONTINUED] polyethylene glycol-electrolytes (NULYTELY/GOLYTELY) 420 g solution Take by mouth.  . [DISCONTINUED] Potassium 99 MG TABS Take by mouth.  . [DISCONTINUED] Suvorexant (BELSOMRA) 20 MG TABS Take 20 tablets by mouth at bedtime as needed. (Patient not taking: Reported on 06/03/2018)   No facility-administered encounter medications on file as of 06/03/2018.       Medical History: Past Medical History:  Diagnosis Date  . Alcohol abuse   . Benzodiazepine dependence ([place])   . Benzodiazepine withdrawal ([place])   . Chronic pain in right foot   . Depression 08/19/2017  . Hepatitis C   . Hypertension   . Seizures ([place]) 2010  . Sleep apnea   . Syncope    questionable vasovagal     Vital Signs: BP (!) 174/97 (BP Location: Left Arm, Patient Position: Sitting, Cuff Size: Normal)   Pulse 68   Temp (!) 97.5 F (36.4 C) (Oral)   Resp 16   Ht  5\' 9"  (1.753 m)   Wt 213 lb 6.4 oz (96.8 kg)   SpO2 97%   BMI 31.51 kg/m    Review of Systems  Constitutional: Negative.  Negative for chills, fatigue and unexpected weight change.  HENT: Negative.  Negative for congestion, rhinorrhea, sneezing and sore throat.   Eyes: Negative for redness.  Respiratory: Negative.  Negative for cough, chest tightness and shortness of breath.   Cardiovascular: Negative.  Negative for chest pain and palpitations.  Gastrointestinal: Negative.  Negative for abdominal  pain, constipation, diarrhea, nausea and vomiting.  Endocrine: Negative.   Genitourinary: Negative.  Negative for dysuria and frequency.  Musculoskeletal: Negative.  Negative for arthralgias, back pain, joint swelling and neck pain.  Skin: Negative.  Negative for rash.  Allergic/Immunologic: Negative.   Neurological: Negative.  Negative for tremors and numbness.  Hematological: Negative for adenopathy. Does not bruise/bleed easily.  Psychiatric/Behavioral: Negative.  Negative for behavioral problems, sleep disturbance and suicidal ideas. The patient is not nervous/anxious.     Physical Exam Vitals signs and nursing note reviewed.  Constitutional:      General: He is not in acute distress.    Appearance: He is well-developed. He is not diaphoretic.  HENT:     Head: Normocephalic and atraumatic.     Mouth/Throat:     Pharynx: No oropharyngeal exudate.  Eyes:     Pupils: Pupils are equal, round, and reactive to light.  Neck:     Musculoskeletal: Normal range of motion and neck supple.     Thyroid: No thyromegaly.     Vascular: No JVD.     Trachea: No tracheal deviation.  Cardiovascular:     Rate and Rhythm: Normal rate and regular rhythm.     Heart sounds: Normal heart sounds. No murmur. No friction rub. No gallop.   Pulmonary:     Effort: Pulmonary effort is normal. No respiratory distress.     Breath sounds: Normal breath sounds. No wheezing or rales.  Chest:     Chest wall: No tenderness.  Abdominal:     Palpations: Abdomen is soft.     Tenderness: There is no abdominal tenderness. There is no guarding.  Musculoskeletal: Normal range of motion.  Lymphadenopathy:     Cervical: No cervical adenopathy.  Skin:    General: Skin is warm and dry.  Neurological:     Mental Status: He is alert and oriented to person, place, and time.     Cranial Nerves: No cranial nerve deficit.  Psychiatric:        Behavior: Behavior normal.        Thought Content: Thought content normal.         Judgment: Judgment normal.    Assessment/Plan: 1. Thrush, oral Prescribe patient hydrocortisone solution for yeast growth.  Had lengthy discussion with patient about possible therapies and decided it would be the most appropriate at this time.  - itraconazole (SPORANOX) 10 MG/ML solution; Take 20 mLs (200 mg total) by mouth daily.  Dispense: 280 mL; Refill: 0  2. Essential hypertension Elevated slightly today.  We will continue to monitor future visits.  Likely due to patient's discomfort at this time.  General Counseling: morrill [email or web address] understanding of the findings of todays visit and agrees with plan of treatment. I have discussed any further diagnostic evaluation that may be needed or ordered today. We also reviewed his medications today. he has been encouraged to call the office with any questions or concerns that should arise related to todays  visit.   No orders of the defined types were placed in this encounter.   Meds ordered this encounter  Medications  . itraconazole (SPORANOX) 10 MG/ML solution    Sig: Take 20 mLs (200 mg total) by mouth daily.    Dispense:  280 mL    Refill:  0    Time spent: 25 Minutes  This patient was seen by Orson Gear AGNP-C in Collaboration with Dr Lavera Guise as a part of collaborative care agreement.  Kendell Bane AGNP-C Internal Medicine

## 2018-06-22 ENCOUNTER — Encounter: Payer: Self-pay | Admitting: Adult Health

## 2018-06-22 ENCOUNTER — Ambulatory Visit (INDEPENDENT_AMBULATORY_CARE_PROVIDER_SITE_OTHER): Payer: Medicare HMO | Admitting: Adult Health

## 2018-06-22 VITALS — BP 162/78 | HR 80 | Temp 98.5°F | Resp 16 | Ht 69.0 in | Wt 214.0 lb

## 2018-06-22 DIAGNOSIS — M62838 Other muscle spasm: Secondary | ICD-10-CM

## 2018-06-22 DIAGNOSIS — M545 Low back pain, unspecified: Secondary | ICD-10-CM

## 2018-06-22 NOTE — Progress Notes (Signed)
[facility] [name], [name] [address]  Internal MEDICINE  Office Visit Note  Patient Name: [name]  [id number]  [id number]  Date of Service: 08/26/2018  Chief Complaint  Patient presents with  . Back Pain    started sunday, patient believes its muscle related      HPI Pt is here for a sick visit. Pt reports Back pain that started yesterday.  He feels like he pulled a muscle. He was taking the trash out, and feels like he may have twisted wrong.  He denies any radiation of the pain.  Denies bowel or bladder issues. No headaches, fever or other symptoms.      Current Medication:  Outpatient Encounter Medications as of 06/22/2018  Medication Sig  . amLODipine (NORVASC) 5 MG tablet Take 1 tablet (5 mg total) by mouth daily.  Marland Kitchen antiseptic oral rinse (BIOTENE) LIQD 15 mLs by Mouth Rinse route as needed for dry mouth.  Marland Kitchen aspirin EC 81 MG tablet Take 81 mg by mouth daily.  . Multiple Vitamin (MULTIVITAMIN WITH MINERALS) TABS tablet Take 1 tablet by mouth daily.  Marland Kitchen nystatin (MYCOSTATIN) 100000 UNIT/ML suspension Take 5 mLs (500,000 Units total) by mouth 4 (four) times daily.  . Oreg-Peppermint-Thyme-Gldnseal (YEAST FORMULA PO) Take by mouth. Yeast cleanse  6 pills a day  . [DISCONTINUED] fluconazole (DIFLUCAN) 150 MG tablet Take 1 tablet po once. May repeat dose in 3 days as needed for persistent symptoms.  . [DISCONTINUED] itraconazole (SPORANOX) 10 MG/ML solution Take 20 mLs (200 mg total) by mouth daily.   No facility-administered encounter medications on file as of 06/22/2018.       Medical History: Past Medical History:  Diagnosis Date  . Alcohol abuse   . Benzodiazepine dependence ([place])   . Benzodiazepine withdrawal ([place])   . Chronic pain in right foot   . Depression 08/19/2017  . Hepatitis C   . Hypertension   . Seizures ([place]) 2010  . Sleep apnea   . Syncope    questionable vasovagal     Vital Signs: BP (!) 162/78    Pulse 80   Temp 98.5 F (36.9 C) (Oral)   Resp 16   Ht 5\' 9"  (1.753 m)   Wt 214 lb (97.1 kg)   SpO2 94%   BMI 31.60 kg/m    Review of Systems  Constitutional: Negative.  Negative for chills, fatigue and unexpected weight change.  HENT: Negative.  Negative for congestion, rhinorrhea, sneezing and sore throat.   Eyes: Negative for redness.  Respiratory: Negative.  Negative for cough, chest tightness and shortness of breath.   Cardiovascular: Negative.  Negative for chest pain and palpitations.  Gastrointestinal: Negative.  Negative for abdominal pain, constipation, diarrhea, nausea and vomiting.  Endocrine: Negative.   Genitourinary: Negative.  Negative for dysuria and frequency.  Musculoskeletal: Positive for back pain. Negative for arthralgias, joint swelling and neck pain.  Skin: Negative.  Negative for rash.  Allergic/Immunologic: Negative.   Neurological: Negative.  Negative for dizziness, tremors, weakness and numbness.  Hematological: Negative for adenopathy. Does not bruise/bleed easily.  Psychiatric/Behavioral: Negative.  Negative for behavioral problems, sleep disturbance and suicidal ideas. The patient is not nervous/anxious.     Physical Exam Vitals signs and nursing note reviewed.  Constitutional:      General: He is not in acute distress.    Appearance: He is well-developed. He is not diaphoretic.  HENT:     Head: Normocephalic and atraumatic.  Mouth/Throat:     Pharynx: No oropharyngeal exudate.  Eyes:     Pupils: Pupils are equal, round, and reactive to light.  Neck:     Musculoskeletal: Normal range of motion and neck supple.     Thyroid: No thyromegaly.     Vascular: No JVD.     Trachea: No tracheal deviation.  Cardiovascular:     Rate and Rhythm: Normal rate and regular rhythm.     Heart sounds: Normal heart sounds. No murmur. No friction rub. No gallop.   Pulmonary:     Effort: Pulmonary effort is normal. No respiratory distress.     Breath  sounds: Normal breath sounds. No wheezing or rales.  Chest:     Chest wall: No tenderness.  Abdominal:     Palpations: Abdomen is soft.     Tenderness: There is no abdominal tenderness. There is no guarding.  Musculoskeletal: Normal range of motion.        General: Tenderness present.     Comments: Back tenderness with palpation  Lymphadenopathy:     Cervical: No cervical adenopathy.  Skin:    General: Skin is warm and dry.  Neurological:     Mental Status: He is alert and oriented to person, place, and time.     Cranial Nerves: No cranial nerve deficit.  Psychiatric:        Behavior: Behavior normal.        Thought Content: Thought content normal.        Judgment: Judgment normal.     Assessment/Plan: 1. Acute midline low back pain without sciatica Rest, Ice, anti-inflammatory like ibuprofen.  Return to clinic if symptoms change or fail to improve.  2. Muscle spasm Use muslce relaxer as prescribed.  Do not mix with alcohol.  Flexeril 10mg  po #20, no refills.   General Counseling: norwin aleman understanding of the findings of todays visit and agrees with plan of treatment. I have discussed any further diagnostic evaluation that may be needed or ordered today. We also reviewed his medications today. he has been encouraged to call the office with any questions or concerns that should arise related to todays visit.   No orders of the defined types were placed in this encounter.   No orders of the defined types were placed in this encounter.   Time spent: 15 Minutes  This patient was seen by Orson Gear AGNP-C in Collaboration with Dr Lavera Guise as a part of collaborative care agreement.  Kendell Bane AGNP-C Internal Medicine

## 2018-07-20 ENCOUNTER — Ambulatory Visit (INDEPENDENT_AMBULATORY_CARE_PROVIDER_SITE_OTHER): Payer: Medicare HMO | Admitting: Nurse Practitioner

## 2018-07-20 ENCOUNTER — Encounter: Payer: Self-pay | Admitting: Nurse Practitioner

## 2018-07-20 VITALS — BP 150/82 | HR 60 | Resp 16 | Ht 69.5 in | Wt 208.0 lb

## 2018-07-20 DIAGNOSIS — Z0001 Encounter for general adult medical examination with abnormal findings: Secondary | ICD-10-CM

## 2018-07-20 DIAGNOSIS — M5441 Lumbago with sciatica, right side: Secondary | ICD-10-CM | POA: Diagnosis not present

## 2018-07-20 DIAGNOSIS — B370 Candidal stomatitis: Secondary | ICD-10-CM | POA: Diagnosis not present

## 2018-07-20 DIAGNOSIS — I10 Essential (primary) hypertension: Secondary | ICD-10-CM

## 2018-07-20 DIAGNOSIS — R300 Dysuria: Secondary | ICD-10-CM | POA: Diagnosis not present

## 2018-07-20 DIAGNOSIS — M5442 Lumbago with sciatica, left side: Secondary | ICD-10-CM | POA: Diagnosis not present

## 2018-07-20 DIAGNOSIS — G2581 Restless legs syndrome: Secondary | ICD-10-CM | POA: Insufficient documentation

## 2018-07-20 MED ORDER — CYCLOBENZAPRINE HCL 5 MG PO TABS: 5.0000 mg | ORAL_TABLET | Freq: Two times a day (BID) | ORAL | 1 refills | Status: DC | PRN

## 2018-07-20 MED ORDER — ITRACONAZOLE 10 MG/ML PO SOLN: 200.0000 mg | Freq: Every day | ORAL | 0 refills | Status: DC

## 2018-07-20 MED ORDER — ROPINIROLE HCL 0.25 MG PO TABS: 0.2500 mg | ORAL_TABLET | Freq: Every day | ORAL | 3 refills | Status: DC

## 2018-07-20 MED ORDER — FLUCONAZOLE 150 MG PO TABS: ORAL_TABLET | ORAL | 5 refills | Status: DC

## 2018-07-20 NOTE — Patient Instructions (Signed)
Oral Thrush, Adult  Oral thrush is an infection in your mouth and throat. It causes white patches on your tongue and in your mouth. Follow these instructions at home: Helping with soreness   To lessen your pain: ? Drink cold liquids, like water and iced tea. ? Eat frozen ice pops or frozen juices. ? Eat foods that are easy to swallow, like gelatin and ice cream. ? Drink from a straw if the patches in your mouth are painful. General instructions  Take or use over-the-counter and prescription medicines only as told by your doctor. Medicine for oral thrush may be something to swallow, or it may be something to put on the infected area.  Eat plain yogurt that has live cultures in it. Read the label to make sure.  If you wear dentures: ? Take out your dentures before you go to bed. ? Brush them well. ? Soak them in a denture cleaner.  Rinse your mouth with warm salt-water many times a day. To make the salt-water mixture, completely dissolve 1/2-1 teaspoon of salt in 1 cup of warm water. Contact a doctor if:  Your problems are getting worse.  Your problems do not get better in less than 7 days with treatment.  Your infection is spreading. This may show as white patches on the skin outside of your mouth.  You are nursing your baby and you have redness and pain in the nipples. This information is not intended to replace advice given to you by your health care provider. Make sure you discuss any questions you have with your health care provider. Document Released: 07/24/2009 Document Revised: 01/22/2016 Document Reviewed: 01/22/2016 Elsevier Interactive Patient Education  2019 Elsevier Inc.  

## 2018-07-20 NOTE — Progress Notes (Signed)
[facility] [place], [place] [address]  Internal MEDICINE  Office Visit Note  Patient Name: [name]  [id number]  [id number]  Date of Service: 07/20/2018   Pt is here for routine health maintenance examination  Chief Complaint  Patient presents with  . Annual Exam  . Depression     He complains of thrush in his mouth that has been persistent for the past 11 months.  He has taken 2 courses of Diflucan with minimal success.  He has recently started taking multiple over-the-counter medications as well as some herbal supplements that are supposed to help with overgrowth of yeast.  Listed in his medications.  This seems to have helped some however patient continues to report candidiasis of his oral cavity. Did do round of oral swish and swallow with sporanox which did help for a little longer than diflucan. However, symptoms started again about 2 days ago. Makes his gums hurt and inside of his mouth hurt. Hard to eat due to the pain.  moderato lower back pain as well. Feels like he may have pulled a muscle or something. Has been using ice intermittently to help pain. In the past, he took muscle relaxer, flexeril, and this helped more than anything. Would like to have new prescription for this today. He is also experiencing some burning type pain along the top of his feet, mostly when he is lying down at night. May be coming from the lower back. Does have history of significant injury to the right foot, however, this pain is affecting both feet.     Current Medication: Outpatient Encounter Medications as of 07/20/2018  Medication Sig  . amLODipine (NORVASC) 5 MG tablet Take 1 tablet (5 mg total) by mouth daily.  Marland Kitchen antiseptic oral rinse (BIOTENE) LIQD 15 mLs by Mouth Rinse route as needed for dry mouth.  Marland Kitchen aspirin EC 81 MG tablet Take 81 mg by mouth daily.  . fluconazole (DIFLUCAN) 150 MG tablet Take 1 tablet po once weekly  . itraconazole (SPORANOX) 10  MG/ML solution Take 20 mLs (200 mg total) by mouth daily.  . Multiple Vitamin (MULTIVITAMIN WITH MINERALS) TABS tablet Take 1 tablet by mouth daily.  Marland Kitchen nystatin (MYCOSTATIN) 100000 UNIT/ML suspension Take 5 mLs (500,000 Units total) by mouth 4 (four) times daily.  . Oreg-Peppermint-Thyme-Gldnseal (YEAST FORMULA PO) Take by mouth. Yeast cleanse  6 pills a day  . [DISCONTINUED] fluconazole (DIFLUCAN) 150 MG tablet Take 1 tablet po once. May repeat dose in 3 days as needed for persistent symptoms.  . [DISCONTINUED] itraconazole (SPORANOX) 10 MG/ML solution Take 20 mLs (200 mg total) by mouth daily.  . cyclobenzaprine (FLEXERIL) 5 MG tablet Take 1 tablet (5 mg total) by mouth 2 (two) times daily as needed for muscle spasms.  Marland Kitchen rOPINIRole (REQUIP) 0.25 MG tablet Take 1 tablet (0.25 mg total) by mouth at bedtime.   No facility-administered encounter medications on file as of 07/20/2018.     Surgical History: Past Surgical History:  Procedure Laterality Date  . ANKLE FRACTURE SURGERY Right   . COLONOSCOPY WITH PROPOFOL N/A 12/16/2016   Procedure: COLONOSCOPY WITH PROPOFOL;  Surgeon: Lollie Sails, MD;  Location[facility] ENDOSCOPY;  Service: Endoscopy;  Laterality: N/A;  . COLONOSCOPY WITH PROPOFOL N/A 03/14/2017   Procedure: COLONOSCOPY WITH PROPOFOL;  Surgeon: Lollie Sails, MD;  Location[facility] ENDOSCOPY;  Service: Endoscopy;  Laterality: N/A;  . HEMORRHOID SURGERY    . LITHOTRIPSY    . TONSILLECTOMY  Medical History: Past Medical History:  Diagnosis Date  . Alcohol abuse   . Benzodiazepine dependence ([place])   . Benzodiazepine withdrawal ([place])   . Chronic pain in right foot   . Depression 08/19/2017  . Hepatitis C   . Hypertension   . Seizures ([place]) 2010  . Sleep apnea   . Syncope    questionable vasovagal    Family History: Family History  Problem Relation Age of Onset  . Arthritis Mother   . Hypertension Mother   . Cancer Father   . Kidney disease Father   . Alcohol  abuse Paternal Aunt   . Depression Maternal Grandfather       Review of Systems  Constitutional: Positive for activity change. Negative for chills, fatigue and unexpected weight change.  HENT: Positive for mouth sores. Negative for congestion, rhinorrhea, sneezing and sore throat.        Persistent flush infection.  Respiratory: Negative for cough, chest tightness, shortness of breath and wheezing.   Cardiovascular: Negative for chest pain and palpitations.       Blood pressure slightly elevated  Gastrointestinal: Negative for abdominal pain, constipation, diarrhea, nausea and vomiting.  Endocrine: Negative for cold intolerance, heat intolerance, polydipsia and polyuria.  Genitourinary: Negative for dysuria and frequency.  Musculoskeletal: Positive for back pain and myalgias. Negative for arthralgias, joint swelling and neck pain.       Bilateral foot pain, worse at night. Burning sensation in the feet.   Skin: Negative.  Negative for rash.  Allergic/Immunologic: Negative for environmental allergies.  Neurological: Negative for dizziness, tremors, numbness and headaches.  Hematological: Negative for adenopathy. Does not bruise/bleed easily.  Psychiatric/Behavioral: Negative for behavioral problems, sleep disturbance and suicidal ideas. The patient is nervous/anxious.      Today's Vitals   07/20/18 0839  BP: (!) 150/82  Pulse: 60  Resp: 16  SpO2: 95%  Weight: 208 lb (94.3 kg)  Height: 5' 9.5" (1.765 m)   Body mass index is 30.28 kg/m.  Physical Exam Vitals signs and nursing note reviewed.  Constitutional:      General: He is not in acute distress.    Appearance: Normal appearance. He is well-developed. He is not diaphoretic.  HENT:     Head: Normocephalic and atraumatic.     Mouth/Throat:     Pharynx: No oropharyngeal exudate.     Comments: Plaques of thrush on the tongue. Sore gums and cheeks.  Eyes:     Conjunctiva/sclera: Conjunctivae normal.     Pupils: Pupils are  equal, round, and reactive to light.  Neck:     Musculoskeletal: Normal range of motion and neck supple.     Thyroid: No thyromegaly.     Vascular: No carotid bruit or JVD.     Trachea: No tracheal deviation.  Cardiovascular:     Rate and Rhythm: Normal rate and regular rhythm.     Pulses: Normal pulses.     Heart sounds: Normal heart sounds. No murmur. No friction rub. No gallop.   Pulmonary:     Effort: Pulmonary effort is normal. No respiratory distress.     Breath sounds: Normal breath sounds. No wheezing or rales.  Chest:     Chest wall: No tenderness.  Abdominal:     General: Bowel sounds are normal.     Palpations: Abdomen is soft.     Tenderness: There is no abdominal tenderness. There is no guarding.  Musculoskeletal: Normal range of motion.     Comments: Moderate lower back  pain, worse when bending and twisting at the waist. Grimacing when changing position. No visible or palpable abnormalities today.   Lymphadenopathy:     Cervical: No cervical adenopathy.  Skin:    General: Skin is warm and dry.  Neurological:     Mental Status: He is alert and oriented to person, place, and time. Mental status is at baseline.     Cranial Nerves: No cranial nerve deficit.  Psychiatric:        Behavior: Behavior normal.        Thought Content: Thought content normal.        Judgment: Judgment normal.    Depression screen [facility] 2/9 07/20/2018 06/22/2018 06/03/2018 03/03/2018 11/20/2017  Decreased Interest 0 0 0 0 3  Down, Depressed, Hopeless 0 0 0 0 3  PHQ - 2 Score 0 0 0 0 6  Altered sleeping - - - - 3  Tired, decreased energy - - - - 1  Change in appetite - - - - 0  Feeling bad or failure about yourself  - - - - 0  Trouble concentrating - - - - 1  Moving slowly or fidgety/restless - - - - 0  Suicidal thoughts - - - - 0  PHQ-9 Score - - - - 11    Functional Status Survey: Is the patient deaf or have difficulty hearing?: No Does the patient have difficulty seeing, even when  wearing glasses/contacts?: No Does the patient have difficulty concentrating, remembering, or making decisions?: No Does the patient have difficulty walking or climbing stairs?: No Does the patient have difficulty dressing or bathing?: No Does the patient have difficulty doing errands alone such as visiting a doctor's office or shopping?: No  MMSE - [place] Exam 07/20/2018  Orientation to time 5  Orientation to Place 5  Registration 3  Attention/ Calculation 5  Recall 3  Language- name 2 objects 2  Language- repeat 1  Language- follow 3 step command 3  Language- read & follow direction 1  Write a sentence 1  Copy design 1  Total score 30    Fall Risk  07/20/2018 06/22/2018 06/03/2018 03/03/2018 11/20/2017  Falls in the past year? 0 0 0 No No  Number falls in past yr: 0 - - - -  Injury with Fall? 0 - - - -    Assessment/Plan: 1. Encounter for general adult medical examination with abnormal findings Annual health maintenance exam today.   2. Essential hypertension Generally stable. Continue bp medication as prescribed   3. 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 Recommend application of heat to sore areas in the back. May take OTC tylenol or ibuprofen for pain if needed  - cyclobenzaprine (FLEXERIL) 5 MG tablet; Take 1 tablet (5 mg total) by mouth 2 (two) times daily as needed for muscle spasms.  Dispense: 45 tablet; Refill: 1  4. Restless leg syndrome Add requip 0.25mg  at bedtime to help with pain in feet at night. Reassess at next visit.  - rOPINIRole (REQUIP) 0.25 MG tablet; Take 1 tablet (0.25 mg total) by mouth at bedtime.  Dispense: 30 tablet; Refill: 3  5. Thrush, oral Start diflucan 150mg  once weekly as thrush has been persistent. Repeat sporanox solution. Swish and swallow with 20mg  daily.  - fluconazole (DIFLUCAN) 150 MG tablet; Take 1 tablet po once weekly  Dispense: 4 tablet; Refill:  5 - itraconazole (SPORANOX) 10 MG/ML  solution; Take 20 mLs (200 mg total) by mouth daily.  Dispense: 280 mL; Refill: 0  6. Dysuria - UA/M w/rflx Culture, Routine  General Counseling: [place] verbalizes understanding of the findings of todays visit and agrees with plan of treatment. I have discussed any further diagnostic evaluation that may be needed or ordered today. We also reviewed his medications today. he has been encouraged to call the office with any questions or concerns that should arise related to todays visit.    Counseling:  Hypertension Counseling:   The following hypertensive lifestyle modification were recommended and discussed:  1. Limiting alcohol intake to less than 1 oz/day of ethanol:(24 oz of beer or 8 oz of wine or 2 oz of 100-proof whiskey). 2. Take baby ASA 81 mg daily. 3. Importance of regular aerobic exercise and losing weight. 4. Reduce dietary saturated fat and cholesterol intake for overall cardiovascular health. 5. Maintaining adequate dietary potassium, calcium, and magnesium intake. 6. Regular monitoring of the blood pressure. 7. Reduce sodium intake to less than 100 mmol/day (less than 2.3 gm of sodium or less than 6 gm of sodium choride)   This patient was seen by [place] with Dr Lavera Guise as a part of collaborative care agreement  Orders Placed This Encounter  Procedures  . UA/M w/rflx Culture, Routine    Meds ordered this encounter  Medications  . fluconazole (DIFLUCAN) 150 MG tablet    Sig: Take 1 tablet po once weekly    Dispense:  4 tablet    Refill:  5    Order Specific Question:   Supervising Provider    Answer:   Lavera Guise [[id number]]  . itraconazole (SPORANOX) 10 MG/ML solution    Sig: Take 20 mLs (200 mg total) by mouth daily.    Dispense:  280 mL    Refill:  0    Order Specific Question:   Supervising Provider    Answer:   Lavera Guise [[id number]]  . cyclobenzaprine (FLEXERIL) 5 MG tablet    Sig: Take 1  tablet (5 mg total) by mouth 2 (two) times daily as needed for muscle spasms.    Dispense:  45 tablet    Refill:  1    Order Specific Question:   Supervising Provider    Answer:   Lavera Guise [[id number]]  . rOPINIRole (REQUIP) 0.25 MG tablet    Sig: Take 1 tablet (0.25 mg total) by mouth at bedtime.    Dispense:  30 tablet    Refill:  3    Order Specific Question:   Supervising Provider    Answer:   Lavera Guise [[id number]]    Time spent: [place], MD  Internal Medicine

## 2018-07-21 LAB — UA/M W/RFLX CULTURE, ROUTINE
Bilirubin, UA: NEGATIVE
Glucose, UA: NEGATIVE
Ketones, UA: NEGATIVE
Leukocytes, UA: NEGATIVE
Nitrite, UA: NEGATIVE
PH UA: 5 (ref 5.0–7.5)
Protein, UA: NEGATIVE
RBC, UA: NEGATIVE
Specific Gravity, UA: 1.024 (ref 1.005–1.030)
Urobilinogen, Ur: 0.2 mg/dL (ref 0.2–1.0)

## 2018-07-21 LAB — MICROSCOPIC EXAMINATION
Bacteria, UA: NONE SEEN
Casts: NONE SEEN /lpf
EPITHELIAL CELLS (NON RENAL): NONE SEEN /HPF (ref 0–10)

## 2018-08-13 ENCOUNTER — Other Ambulatory Visit: Payer: Self-pay

## 2018-08-13 DIAGNOSIS — M5442 Lumbago with sciatica, left side: Principal | ICD-10-CM

## 2018-08-13 DIAGNOSIS — M5441 Lumbago with sciatica, right side: Secondary | ICD-10-CM

## 2018-08-13 MED ORDER — CYCLOBENZAPRINE HCL 5 MG PO TABS: 5.0000 mg | ORAL_TABLET | Freq: Two times a day (BID) | ORAL | 0 refills | Status: DC | PRN

## 2018-08-26 ENCOUNTER — Encounter: Payer: Self-pay | Admitting: Adult Health

## 2018-08-26 NOTE — Patient Instructions (Signed)
Muscle Strain A muscle strain is an injury that happens when a muscle is stretched longer than normal. This can happen during a fall, sports, or lifting. This can tear some muscle fibers. Usually, recovery from muscle strain takes 1-2 weeks. Complete healing normally takes 5-6 weeks. This condition is first treated with PRICE therapy. This involves:  Protecting your muscle from being injured again.  Resting your injured muscle.  Icing your injured muscle.  Applying pressure (compression) to your injured muscle. This may be done with a splint or elastic bandage.  Raising (elevating) your injured muscle. Your doctor may also recommend medicine for pain. Follow these instructions at home: If you have a splint:  Wear the splint as told by your doctor. Take it off only as told by your doctor.  Loosen the splint if your fingers or toes tingle, get numb, or turn cold and blue.  Keep the splint clean.  If the splint is not waterproof: ? Do not let it get wet. ? Cover it with a watertight covering when you take a bath or a shower. Managing pain, stiffness, and swelling   If directed, put ice on your injured area. ? If you have a removable splint, take it off as told by your doctor. ? Put ice in a plastic bag. ? Place a towel between your skin and the bag. ? Leave the ice on for 20 minutes, 2-3 times a day.  Move your fingers or toes often. This helps to avoid stiffness and lessen swelling.  Raise your injured area above the level of your heart while you are sitting or lying down.  Wear an elastic bandage as told by your doctor. Make sure it is not too tight. General instructions  Take over-the-counter and prescription medicines only as told by your doctor.  Limit your activity. Rest your injured muscle as told by your doctor. Your doctor may say that gentle movements are okay.  If physical therapy was prescribed, do exercises as told by your doctor.  Do not put pressure on any  part of the splint until it is fully hardened. This may take many hours.  Do not use any products that contain nicotine or tobacco, such as cigarettes and e-cigarettes. These can delay bone healing. If you need help quitting, ask your doctor.  Warm up before you exercise. This helps to prevent more muscle strains.  Ask your doctor when it is safe to drive if you have a splint.  Keep all follow-up visits as told by your doctor. This is important. Contact a doctor if:  You have more pain or swelling in your injured area. Get help right away if:  You have any of these problems in your injured area: ? You have numbness. ? You have tingling. ? You lose a lot of strength. Summary  A muscle strain is an injury that happens when a muscle is stretched longer than normal.  This condition is first treated with PRICE therapy. This includes protecting, resting, icing, adding pressure, and raising your injury.  Limit your activity. Rest your injured muscle as told by your doctor. Your doctor may say that gentle movements are okay.  Warm up before you exercise. This helps to prevent more muscle strains. This information is not intended to replace advice given to you by your health care provider. Make sure you discuss any questions you have with your health care provider. Document Released: 02/06/2008 Document Revised: 06/05/2016 Document Reviewed: 06/05/2016 Elsevier Interactive Patient Education  2019 [employer]  

## 2018-09-01 ENCOUNTER — Other Ambulatory Visit: Payer: Self-pay

## 2018-09-01 ENCOUNTER — Ambulatory Visit (INDEPENDENT_AMBULATORY_CARE_PROVIDER_SITE_OTHER): Payer: Medicare HMO | Admitting: Nurse Practitioner

## 2018-09-01 ENCOUNTER — Encounter: Payer: Self-pay | Admitting: Nurse Practitioner

## 2018-09-01 VITALS — Resp 16 | Ht 69.5 in | Wt 209.0 lb

## 2018-09-01 DIAGNOSIS — G2581 Restless legs syndrome: Secondary | ICD-10-CM

## 2018-09-01 DIAGNOSIS — B370 Candidal stomatitis: Secondary | ICD-10-CM

## 2018-09-01 DIAGNOSIS — I10 Essential (primary) hypertension: Secondary | ICD-10-CM | POA: Diagnosis not present

## 2018-09-01 MED ORDER — FLUCONAZOLE 150 MG PO TABS: ORAL_TABLET | ORAL | 5 refills | Status: DC

## 2018-09-01 MED ORDER — AMLODIPINE BESYLATE 5 MG PO TABS: 5.0000 mg | ORAL_TABLET | Freq: Every day | ORAL | 3 refills | Status: DC

## 2018-09-01 MED ORDER — ROPINIROLE HCL 0.5 MG PO TABS: 0.2500 mg | ORAL_TABLET | Freq: Every day | ORAL | 2 refills | Status: DC

## 2018-09-01 MED ORDER — ROPINIROLE HCL 0.5 MG PO TABS: 0.5000 mg | ORAL_TABLET | Freq: Every day | ORAL | 2 refills | Status: DC

## 2018-09-01 NOTE — Progress Notes (Signed)
[facility] [place], [place] [address]  Internal MEDICINE  Telephone Visit  Patient Name: [name]  [id number]  [id number]  Date of Service: 09/01/2018  I connected with the patient at 11:17am by telephone and verified the patients identity using two identifiers.   I discussed the limitations, risks, security and privacy concerns of performing an evaluation and management service by telephone and the availability of in person appointments. I also discussed with the patient that there may be a patient responsible charge related to the service.  The patient expressed understanding and agrees to proceed.    Chief Complaint  Patient presents with  . Telephone Screen  . Medical Management of Chronic Issues    6 week follow up medication , mouth is doing a little better , burning in feet still there   . Telephone Assessment    The patient has been contacted via telephone for follow up visit due to concerns for spread of novel coronavirus. The patient continues to c/o severe pain, burning, and cramping in his feet. This is especially bad at night. He was started on Requip 0.25mg  at bedtime.does not feel like this is really helping at all. He complains of thrush in his mouth that has been persistent for the past 11 months.  He has is currently taking course of weekly diflucan and swishing with  sporanox twice daily.  He is having some success with this persistent treatment. Will follow up with his dentist as soon as restrictions lift related COVID 19. Currently, they are currently not seeing routine visits and will not until these restrictions start to lift.          Current Medication: Outpatient Encounter Medications as of 09/01/2018  Medication Sig  . amLODipine (NORVASC) 5 MG tablet Take 1 tablet (5 mg total) by mouth daily.  Marland Kitchen antiseptic oral rinse (BIOTENE) LIQD 15 mLs by Mouth Rinse route as needed for dry mouth.  Marland Kitchen aspirin EC 81 MG tablet Take  81 mg by mouth daily.  . cyclobenzaprine (FLEXERIL) 5 MG tablet Take 1 tablet (5 mg total) by mouth 2 (two) times daily as needed for muscle spasms.  . fluconazole (DIFLUCAN) 150 MG tablet Take 1 tablet po once weekly  . itraconazole (SPORANOX) 10 MG/ML solution Take 20 mLs (200 mg total) by mouth daily.  . Multiple Vitamin (MULTIVITAMIN WITH MINERALS) TABS tablet Take 1 tablet by mouth daily.  Marland Kitchen nystatin (MYCOSTATIN) 100000 UNIT/ML suspension Take 5 mLs (500,000 Units total) by mouth 4 (four) times daily.  . Oreg-Peppermint-Thyme-Gldnseal (YEAST FORMULA PO) Take by mouth. Yeast cleanse  6 pills a day  . rOPINIRole (REQUIP) 0.5 MG tablet Take 0.5 tablets (0.25 mg total) by mouth at bedtime.  . [DISCONTINUED] amLODipine (NORVASC) 5 MG tablet Take 1 tablet (5 mg total) by mouth daily.  . [DISCONTINUED] fluconazole (DIFLUCAN) 150 MG tablet Take 1 tablet po once weekly  . [DISCONTINUED] rOPINIRole (REQUIP) 0.25 MG tablet Take 1 tablet (0.25 mg total) by mouth at bedtime.   No facility-administered encounter medications on file as of 09/01/2018.     Surgical History: Past Surgical History:  Procedure Laterality Date  . ANKLE FRACTURE SURGERY Right   . COLONOSCOPY WITH PROPOFOL N/A 12/16/2016   Procedure: COLONOSCOPY WITH PROPOFOL;  Surgeon: Lollie Sails, MD;  Location: [facility] ENDOSCOPY;  Service: Endoscopy;  Laterality: N/A;  . COLONOSCOPY WITH PROPOFOL N/A 03/14/2017   Procedure: COLONOSCOPY WITH PROPOFOL;  Surgeon: Lollie Sails, MD;  Location:  [facility] ENDOSCOPY;  Service: Endoscopy;  Laterality: N/A;  . HEMORRHOID SURGERY    . LITHOTRIPSY    . TONSILLECTOMY      Medical History: Past Medical History:  Diagnosis Date  . Alcohol abuse   . Benzodiazepine dependence ([place])   . Benzodiazepine withdrawal ([place])   . Chronic pain in right foot   . Depression 08/19/2017  . Hepatitis C   . Hypertension   . Seizures ([place]) 2010  . Sleep apnea   . Syncope    questionable vasovagal     Family History: Family History  Problem Relation Age of Onset  . Arthritis Mother   . Hypertension Mother   . Cancer Father   . Kidney disease Father   . Alcohol abuse Paternal Aunt   . Depression Maternal Grandfather     Social History   Socioeconomic History  . Marital status: Divorced    Spouse name: Not on file  . Number of children: Not on file  . Years of education: Not on file  . Highest education level: Not on file  Occupational History  . Not on file  Social Needs  . Financial resource strain: Not on file  . Food insecurity:    Worry: Not on file    Inability: Not on file  . Transportation needs:    Medical: Not on file    Non-medical: Not on file  Tobacco Use  . Smoking status: Former Smoker    Packs/day: 0.50    Types: Cigarettes    Last attempt to quit: 05/06/2017    Years since quitting: 1.3  . Smokeless tobacco: Never Used  Substance and Sexual Activity  . Alcohol use: No    Alcohol/week: 0.0 standard drinks  . Drug use: No  . Sexual activity: Not on file  Lifestyle  . Physical activity:    Days per week: Not on file    Minutes per session: Not on file  . Stress: Not on file  Relationships  . Social connections:    Talks on phone: Not on file    Gets together: Not on file    Attends religious service: Not on file    Active member of club or organization: Not on file    Attends meetings of clubs or organizations: Not on file    Relationship status: Not on file  . Intimate partner violence:    Fear of current or ex partner: Not on file    Emotionally abused: Not on file    Physically abused: Not on file    Forced sexual activity: Not on file  Other Topics Concern  . Not on file  Social History Narrative  . Not on file      Review of Systems  Constitutional: Negative for activity change, chills, fatigue and unexpected weight change.  HENT: Positive for mouth sores. Negative for congestion, rhinorrhea, sneezing and sore throat.         Persistent thrush infection.  Respiratory: Negative for cough, chest tightness, shortness of breath and wheezing.   Cardiovascular: Negative for chest pain and palpitations.       Blood pressure slightly elevated  Gastrointestinal: Negative for abdominal pain, constipation, diarrhea, nausea and vomiting.  Endocrine: Negative for cold intolerance, heat intolerance, polydipsia and polyuria.  Musculoskeletal: Positive for back pain and myalgias. Negative for arthralgias, joint swelling and neck pain.       Bilateral foot pain, worse at night. Burning sensation in the feet.   Skin: Negative for rash.  Allergic/Immunologic: Negative for environmental allergies.  Neurological: Negative for dizziness, tremors, numbness and headaches.  Hematological: Negative for adenopathy. Does not bruise/bleed easily.  Psychiatric/Behavioral: Negative for behavioral problems, sleep disturbance and suicidal ideas. The patient is nervous/anxious.    Today's Vitals   09/01/18 1059  Resp: 16  Weight: 209 lb (94.8 kg)  Height: 5' 9.5" (1.765 m)   Body mass index is 30.42 kg/m.   Observation/Objective:   the patient is alert, oriented, and pleasant. He is currently in no acute distress.    Assessment/Plan: 1. Essential hypertension Has been stable. Continue bp medication as prescribed  - amLODipine (NORVASC) 5 MG tablet; Take 1 tablet (5 mg total) by mouth daily.  Dispense: 90 tablet; Refill: 3  2. Restless leg syndrome Unchanged. Increase Requip to 0.5mg  tablets. Reassess at next visit.  - rOPINIRole (REQUIP) 0.5 MG tablet; Take 0.5 tablets (0.25 mg total) by mouth at bedtime.  Dispense: 30 tablet; Refill: 2  3. Thrush, oral Continue current preventive treatment. Will follow up with dentist, perhaps ENT after COVID 19 restrictions begin to lift.  - fluconazole (DIFLUCAN) 150 MG tablet; Take 1 tablet po once weekly  Dispense: 4 tablet; Refill: 5  General Counseling: Caprice verbalizes  understanding of the findings of today's phone visit and agrees with plan of treatment. I have discussed any further diagnostic evaluation that may be needed or ordered today. We also reviewed his medications today. he has been encouraged to call the office with any questions or concerns that should arise related to todays visit.  This patient was seen by [place] with Dr Lavera Guise as a part of collaborative care agreement  Meds ordered this encounter  Medications  . amLODipine (NORVASC) 5 MG tablet    Sig: Take 1 tablet (5 mg total) by mouth daily.    Dispense:  90 tablet    Refill:  3    Order Specific Question:   Supervising Provider    Answer:   Lavera Guise [[id number]]  . rOPINIRole (REQUIP) 0.5 MG tablet    Sig: Take 0.5 tablets (0.25 mg total) by mouth at bedtime.    Dispense:  30 tablet    Refill:  2    Increased dose    Order Specific Question:   Supervising Provider    Answer:   Lavera Guise [[id number]]  . fluconazole (DIFLUCAN) 150 MG tablet    Sig: Take 1 tablet po once weekly    Dispense:  4 tablet    Refill:  5    Order Specific Question:   Supervising Provider    Answer:   Lavera Guise [[id number]]    Time spent: 57 Minutes    Dr Lavera Guise Internal medicine

## 2018-09-03 ENCOUNTER — Telehealth: Payer: Self-pay | Admitting: Nurse Practitioner

## 2018-09-03 NOTE — Telephone Encounter (Signed)
Medication is not working on his mouth, he has been on nystatin for over year , and currently on diflucan and nothing is working he is about to go insane , would like something to help with the anxiety and a referral to maybe infectious disease , having severe anxiety and pain in the mouth.

## 2018-09-24 DIAGNOSIS — K143 Hypertrophy of tongue papillae: Secondary | ICD-10-CM | POA: Diagnosis not present

## 2018-09-24 DIAGNOSIS — R682 Dry mouth, unspecified: Secondary | ICD-10-CM | POA: Diagnosis not present

## 2018-10-02 DIAGNOSIS — K117 Disturbances of salivary secretion: Secondary | ICD-10-CM | POA: Diagnosis not present

## 2018-10-23 ENCOUNTER — Ambulatory Visit (INDEPENDENT_AMBULATORY_CARE_PROVIDER_SITE_OTHER): Payer: Medicare HMO | Admitting: Nurse Practitioner

## 2018-10-23 ENCOUNTER — Encounter: Payer: Self-pay | Admitting: Nurse Practitioner

## 2018-10-23 ENCOUNTER — Telehealth: Payer: Self-pay | Admitting: Internal Medicine

## 2018-10-23 ENCOUNTER — Other Ambulatory Visit: Payer: Self-pay

## 2018-10-23 VITALS — BP 147/72 | HR 69 | Resp 16 | Ht 69.5 in | Wt 215.4 lb

## 2018-10-23 DIAGNOSIS — K121 Other forms of stomatitis: Secondary | ICD-10-CM

## 2018-10-23 DIAGNOSIS — M5442 Lumbago with sciatica, left side: Secondary | ICD-10-CM | POA: Diagnosis not present

## 2018-10-23 DIAGNOSIS — R69 Illness, unspecified: Secondary | ICD-10-CM | POA: Diagnosis not present

## 2018-10-23 DIAGNOSIS — F5101 Primary insomnia: Secondary | ICD-10-CM | POA: Diagnosis not present

## 2018-10-23 DIAGNOSIS — G2581 Restless legs syndrome: Secondary | ICD-10-CM

## 2018-10-23 DIAGNOSIS — M5441 Lumbago with sciatica, right side: Secondary | ICD-10-CM | POA: Diagnosis not present

## 2018-10-23 DIAGNOSIS — K1230 Oral mucositis (ulcerative), unspecified: Secondary | ICD-10-CM | POA: Diagnosis not present

## 2018-10-23 DIAGNOSIS — E538 Deficiency of other specified B group vitamins: Secondary | ICD-10-CM

## 2018-10-23 MED ORDER — CYCLOBENZAPRINE HCL 5 MG PO TABS: 5.0000 mg | ORAL_TABLET | Freq: Two times a day (BID) | ORAL | 2 refills | Status: DC | PRN

## 2018-10-23 MED ORDER — ROPINIROLE HCL 0.5 MG PO TABS: 0.5000 mg | ORAL_TABLET | Freq: Every day | ORAL | 2 refills | Status: DC

## 2018-10-23 MED ORDER — HYDROXYZINE HCL 25 MG PO TABS: ORAL_TABLET | ORAL | 2 refills | Status: DC

## 2018-10-23 NOTE — Progress Notes (Signed)
Pt is under a lot of stress and anxiety, blood pressure elevated, informed provider.

## 2018-10-23 NOTE — Progress Notes (Signed)
[facility] [place], [place] [address]  Internal MEDICINE  Office Visit Note  Patient Name: [name]  [id number]  [id number]  Date of Service: 10/24/2018   t is here for a sick visit.  Chief Complaint  Patient presents with  . Anxiety    pt is worried about his mout issues and not able to eat or sleep due to mouth problems, having a lot of anxiety, misses his mom that is in the rest home,      The patient is here for sick visit. Continues to have problems with his mouth. Did see Dr. Pryor Ochoa, ENT provider. Does not believe the issue is thrush. Feels like it may be bacterial in nature. Did culture, but will; not have the results for four to six weeks. Dentist is concerned that maybe there is B12 deficiency or thyroid issue. Last labs were done 02/2018 and looked ok. The patient still having issues with restless legs. Increase in ropinerole to 0.5mg  at bedtime has really not helped. He also states that he is having significant anxiety. Is unable to see his mother, as she lives in assisted living home and has been on lockdown due to spread of COVID 52.  P     Current Medication:  Outpatient Encounter Medications as of 10/23/2018  Medication Sig  . amLODipine (NORVASC) 5 MG tablet Take 1 tablet (5 mg total) by mouth daily.  Marland Kitchen aspirin EC 81 MG tablet Take 81 mg by mouth daily.  . cyclobenzaprine (FLEXERIL) 5 MG tablet Take 1 tablet (5 mg total) by mouth 2 (two) times daily as needed for muscle spasms.  Marland Kitchen MAGNESIUM PO Take by mouth.  . Multiple Vitamin (MULTIVITAMIN WITH MINERALS) TABS tablet Take 1 tablet by mouth daily.  . Oreg-Peppermint-Thyme-Gldnseal (YEAST FORMULA PO) Take by mouth. Yeast cleanse  6 pills a day  . rOPINIRole (REQUIP) 0.5 MG tablet Take 1 tablet (0.5 mg total) by mouth at bedtime.  . [DISCONTINUED] cyclobenzaprine (FLEXERIL) 5 MG tablet Take 1 tablet (5 mg total) by mouth 2 (two) times daily as needed for muscle spasms.  .  [DISCONTINUED] rOPINIRole (REQUIP) 0.5 MG tablet Take 1 tablet (0.5 mg total) by mouth at bedtime.  Marland Kitchen antiseptic oral rinse (BIOTENE) LIQD 15 mLs by Mouth Rinse route as needed for dry mouth.  . fluconazole (DIFLUCAN) 150 MG tablet Take 1 tablet po once weekly (Patient not taking: Reported on 10/23/2018)  . hydrOXYzine (ATARAX/VISTARIL) 25 MG tablet Take 1 to 2 tablets po QHS prn insomnia/anxiety  . itraconazole (SPORANOX) 10 MG/ML solution Take 20 mLs (200 mg total) by mouth daily. (Patient not taking: Reported on 10/23/2018)  . nystatin (MYCOSTATIN) 100000 UNIT/ML suspension Take 5 mLs (500,000 Units total) by mouth 4 (four) times daily. (Patient not taking: Reported on 10/23/2018)   No facility-administered encounter medications on file as of 10/23/2018.       Medical History: Past Medical History:  Diagnosis Date  . Alcohol abuse   . Benzodiazepine dependence ([place])   . Benzodiazepine withdrawal ([place])   . Chronic pain in right foot   . Depression 08/19/2017  . Hepatitis C   . Hypertension   . Seizures ([place]) 2010  . Sleep apnea   . Syncope    questionable vasovagal     Today's Vitals   10/23/18 1135  BP: (!) 147/72  Pulse: 69  Resp: 16  SpO2: 95%  Weight: 215 lb 6.4 oz (97.7 kg)  Height: 5' 9.5" (1.765 m)  Body mass index is 31.35 kg/m.  Review of Systems  Constitutional: Positive for fatigue. Negative for activity change, chills and unexpected weight change.  HENT: Positive for mouth sores. Negative for congestion, rhinorrhea, sneezing and sore throat.        Persistent mouth infection.  Recently saw ENT. They don't believe this is thrush recently took bacterial swabs without results as of yet.   Respiratory: Negative for cough, chest tightness, shortness of breath and wheezing.   Cardiovascular: Negative for chest pain and palpitations.       Blood pressure slightly elevated  Gastrointestinal: Negative for abdominal pain, constipation, diarrhea, nausea and vomiting.   Endocrine: Negative for cold intolerance, heat intolerance, polydipsia and polyuria.  Musculoskeletal: Positive for back pain and myalgias. Negative for arthralgias, joint swelling and neck pain.       Bilateral foot pain, worse at night. Burning sensation in the feet.   Skin: Negative for rash.  Allergic/Immunologic: Negative for environmental allergies.  Neurological: Negative for dizziness, tremors, numbness and headaches.  Hematological: Negative for adenopathy. Does not bruise/bleed easily.  Psychiatric/Behavioral: Positive for sleep disturbance. Negative for behavioral problems and suicidal ideas. The patient is nervous/anxious.     Physical Exam Vitals signs and nursing note reviewed.  Constitutional:      General: He is not in acute distress.    Appearance: Normal appearance. He is well-developed. He is not diaphoretic.  HENT:     Head: Normocephalic and atraumatic.     Mouth/Throat:     Pharynx: No oropharyngeal exudate.  Eyes:     Conjunctiva/sclera: Conjunctivae normal.     Pupils: Pupils are equal, round, and reactive to light.  Neck:     Musculoskeletal: Normal range of motion and neck supple.     Thyroid: No thyromegaly.     Vascular: No carotid bruit or JVD.     Trachea: No tracheal deviation.  Cardiovascular:     Rate and Rhythm: Normal rate and regular rhythm.     Pulses: Normal pulses.     Heart sounds: Normal heart sounds. No murmur. No friction rub. No gallop.   Pulmonary:     Effort: Pulmonary effort is normal. No respiratory distress.     Breath sounds: Normal breath sounds. No wheezing or rales.  Chest:     Chest wall: No tenderness.  Abdominal:     General: Bowel sounds are normal.     Palpations: Abdomen is soft.     Tenderness: There is no abdominal tenderness. There is no guarding.  Musculoskeletal: Normal range of motion.     Comments: Moderate lower back pain, worse when bending and twisting at the waist. Grimacing when changing position. No  visible or palpable abnormalities today.   Lymphadenopathy:     Cervical: No cervical adenopathy.  Skin:    General: Skin is warm and dry.  Neurological:     Mental Status: He is alert and oriented to person, place, and time. Mental status is at baseline.     Cranial Nerves: No cranial nerve deficit.  Psychiatric:        Attention and Perception: Attention and perception normal.        Mood and Affect: Mood is anxious. Affect is labile.        Speech: Speech normal.        Behavior: Behavior normal. Behavior is cooperative.        Thought Content: Thought content normal.        Cognition and Memory: Cognition  and memory normal.        Judgment: Judgment normal.   Assessment/Plan: 1. Vitamin B12 deficiency Will check labs, including full iron panel for further evaluation.   2. 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  - cyclobenzaprine (FLEXERIL) 5 MG tablet; Take 1 tablet (5 mg total) by mouth 2 (two) times daily as needed for muscle spasms.  Dispense: 45 tablet; Refill: 2  3. Primary insomnia Add hydroxyzine 25mg , taking one to two tablets at bedtime as needed for anxiety/insomnia.  - hydrOXYzine (ATARAX/VISTARIL) 25 MG tablet; Take 1 to 2 tablets po QHS prn insomnia/anxiety  Dispense: 60 tablet; Refill: 2  4. Restless leg syndrome Continue requip 0.5mg  every evening as needed for restless legs.  - rOPINIRole (REQUIP) 0.5 MG tablet; Take 1 tablet (0.5 mg total) by mouth at bedtime.  Dispense: 30 tablet; Refill: 2  5. Stomatitis and mucositis Continue to use mouth washes as previously prescribed. Follow up with ENT/dentis as scheduled.   General Counseling: lavontae cornia understanding of the findings of todays visit and agrees with plan of treatment. I have discussed any further diagnostic evaluation that may be needed or ordered today. We also reviewed his medications today. he has been encouraged to call  the office with any questions or concerns that should arise related to todays visit.    Counseling:  This patient was seen by [place] with Dr Lavera Guise as a part of collaborative care agreement  Meds ordered this encounter  Medications  . hydrOXYzine (ATARAX/VISTARIL) 25 MG tablet    Sig: Take 1 to 2 tablets po QHS prn insomnia/anxiety    Dispense:  60 tablet    Refill:  2    Order Specific Question:   Supervising Provider    Answer:   Lavera Guise [[id number]]  . rOPINIRole (REQUIP) 0.5 MG tablet    Sig: Take 1 tablet (0.5 mg total) by mouth at bedtime.    Dispense:  30 tablet    Refill:  2    Increased dose    Order Specific Question:   Supervising Provider    Answer:   Lavera Guise [[id number]]  . cyclobenzaprine (FLEXERIL) 5 MG tablet    Sig: Take 1 tablet (5 mg total) by mouth 2 (two) times daily as needed for muscle spasms.    Dispense:  45 tablet    Refill:  2    Order Specific Question:   Supervising Provider    Answer:   Lavera Guise [[id number]]    Time spent: 25 Minutes

## 2018-10-23 NOTE — Telephone Encounter (Signed)
  Cyclobenzaprine HCl 5MG  tablets Approvedtoday [place] Case: [id number], Status: Approved, Coverage Starts on: 05/11/2018, Coverage Ends on: 01/23/2019. Questions? Contact [phone]. hydrOXYzine HCl 25MG  tablets Approvedtoday [place] Case: [id number], Status: Approved, Coverage Starts on: 05/11/2018, Coverage Ends on: 05/13/2019. Pharmacy notified

## 2018-10-24 DIAGNOSIS — E538 Deficiency of other specified B group vitamins: Secondary | ICD-10-CM | POA: Insufficient documentation

## 2018-10-24 DIAGNOSIS — K1230 Oral mucositis (ulcerative), unspecified: Secondary | ICD-10-CM | POA: Insufficient documentation

## 2018-10-24 DIAGNOSIS — K121 Other forms of stomatitis: Secondary | ICD-10-CM | POA: Insufficient documentation

## 2018-10-26 ENCOUNTER — Other Ambulatory Visit: Payer: Self-pay | Admitting: Nurse Practitioner

## 2018-10-26 DIAGNOSIS — K121 Other forms of stomatitis: Secondary | ICD-10-CM | POA: Diagnosis not present

## 2018-10-26 DIAGNOSIS — R790 Abnormal level of blood mineral: Secondary | ICD-10-CM | POA: Diagnosis not present

## 2018-10-26 DIAGNOSIS — E0781 Sick-euthyroid syndrome: Secondary | ICD-10-CM | POA: Diagnosis not present

## 2018-10-26 DIAGNOSIS — D509 Iron deficiency anemia, unspecified: Secondary | ICD-10-CM | POA: Diagnosis not present

## 2018-10-26 DIAGNOSIS — E538 Deficiency of other specified B group vitamins: Secondary | ICD-10-CM | POA: Diagnosis not present

## 2018-10-26 DIAGNOSIS — E611 Iron deficiency: Secondary | ICD-10-CM | POA: Diagnosis not present

## 2018-10-26 DIAGNOSIS — E079 Disorder of thyroid, unspecified: Secondary | ICD-10-CM | POA: Diagnosis not present

## 2018-10-27 LAB — CBC
Hematocrit: 48.6 % (ref 37.5–51.0)
Hemoglobin: 16.6 g/dL (ref 13.0–17.7)
MCH: 31 pg (ref 26.6–33.0)
MCHC: 34.2 g/dL (ref 31.5–35.7)
MCV: 91 fL (ref 79–97)
Platelets: 187 10*3/uL (ref 150–450)
RBC: 5.35 x10E6/uL (ref 4.14–5.80)
RDW: 13.1 % (ref 11.6–15.4)
WBC: 8 10*3/uL (ref 3.4–10.8)

## 2018-10-27 LAB — IRON AND TIBC
Iron Saturation: 27 % (ref 15–55)
Iron: 101 ug/dL (ref 38–169)
Total Iron Binding Capacity: 368 ug/dL (ref 250–450)
UIBC: 267 ug/dL (ref 111–343)

## 2018-10-27 LAB — T3: T3, Total: 137 ng/dL (ref 71–180)

## 2018-10-27 LAB — TSH: TSH: 3.16 u[IU]/mL (ref 0.450–4.500)

## 2018-10-27 LAB — FERRITIN: Ferritin: 158 ng/mL (ref 30–400)

## 2018-10-27 LAB — B12 AND FOLATE PANEL
Folate: 7.2 ng/mL (ref >3.0)
Vitamin B-12: 286 pg/mL (ref 232–1245)

## 2018-10-27 LAB — T4, FREE: Free T4: 1.24 ng/dL (ref 0.82–1.77)

## 2018-10-29 ENCOUNTER — Encounter: Payer: Self-pay | Admitting: Nurse Practitioner

## 2018-10-29 ENCOUNTER — Other Ambulatory Visit: Payer: Self-pay

## 2018-10-29 ENCOUNTER — Ambulatory Visit (INDEPENDENT_AMBULATORY_CARE_PROVIDER_SITE_OTHER): Payer: Medicare HMO | Admitting: Nurse Practitioner

## 2018-10-29 VITALS — BP 139/71 | HR 73 | Resp 16 | Ht 69.0 in | Wt 215.0 lb

## 2018-10-29 DIAGNOSIS — I10 Essential (primary) hypertension: Secondary | ICD-10-CM

## 2018-10-29 DIAGNOSIS — G2581 Restless legs syndrome: Secondary | ICD-10-CM | POA: Diagnosis not present

## 2018-10-29 DIAGNOSIS — R69 Illness, unspecified: Secondary | ICD-10-CM | POA: Diagnosis not present

## 2018-10-29 DIAGNOSIS — F5101 Primary insomnia: Secondary | ICD-10-CM | POA: Diagnosis not present

## 2018-10-29 DIAGNOSIS — K1230 Oral mucositis (ulcerative), unspecified: Secondary | ICD-10-CM

## 2018-10-29 DIAGNOSIS — K121 Other forms of stomatitis: Secondary | ICD-10-CM

## 2018-10-29 MED ORDER — ROPINIROLE HCL 1 MG PO TABS: 1.0000 mg | ORAL_TABLET | Freq: Every day | ORAL | 3 refills | Status: DC

## 2018-10-29 NOTE — Progress Notes (Signed)
[facility] [place], [place] [address]  Internal MEDICINE  Office Visit Note  Patient Name: [name]  [id number]  [id number]  Date of Service: 10/31/2018  Chief Complaint  Patient presents with  . Medical Management of Chronic Issues    2 month follow up  . Hypertension  . Labs Only    review lab results from Monday  . Pain    pt stated that his back has went out again, he done something to it on Monday and in pain    The patient had labs drawn earlier this week, looking for problems related to the thyroid and possibly anemia. Labs were all within normal limits. I added hydroxyzine 25mg  to take at night to help reduce anxiety and to help him sleep better. He states that this has helped some and would like to continue taking it. He states that he heard from ENT office about the culture they took from his mouth and throat. Was told there is no unusual bacteria causing the current problems.       Current Medication: Outpatient Encounter Medications as of 10/29/2018  Medication Sig  . amLODipine (NORVASC) 5 MG tablet Take 1 tablet (5 mg total) by mouth daily.  Marland Kitchen antiseptic oral rinse (BIOTENE) LIQD 15 mLs by Mouth Rinse route as needed for dry mouth.  Marland Kitchen aspirin EC 81 MG tablet Take 81 mg by mouth daily.  . clotrimazole (MYCELEX) 10 MG troche   . cyclobenzaprine (FLEXERIL) 5 MG tablet Take 1 tablet (5 mg total) by mouth 2 (two) times daily as needed for muscle spasms.  . fluconazole (DIFLUCAN) 150 MG tablet Take 1 tablet po once weekly  . hydrOXYzine (ATARAX/VISTARIL) 25 MG tablet Take 1 to 2 tablets po QHS prn insomnia/anxiety  . itraconazole (SPORANOX) 10 MG/ML solution Take 20 mLs (200 mg total) by mouth daily.  Marland Kitchen MAGNESIUM PO Take by mouth.  . Multiple Vitamin (MULTIVITAMIN WITH MINERALS) TABS tablet Take 1 tablet by mouth daily.  Marland Kitchen nystatin (MYCOSTATIN) 100000 UNIT/ML suspension Take 5 mLs (500,000 Units total) by mouth 4 (four) times  daily.  . Oreg-Peppermint-Thyme-Gldnseal (YEAST FORMULA PO) Take by mouth. Yeast cleanse  6 pills a day  . rOPINIRole (REQUIP) 1 MG tablet Take 1 tablet (1 mg total) by mouth at bedtime.  . [DISCONTINUED] rOPINIRole (REQUIP) 0.5 MG tablet Take 1 tablet (0.5 mg total) by mouth at bedtime.   No facility-administered encounter medications on file as of 10/29/2018.     Surgical History: Past Surgical History:  Procedure Laterality Date  . ANKLE FRACTURE SURGERY Right   . COLONOSCOPY WITH PROPOFOL N/A 12/16/2016   Procedure: COLONOSCOPY WITH PROPOFOL;  Surgeon: Lollie Sails, MD;  Location: [facility] ENDOSCOPY;  Service: Endoscopy;  Laterality: N/A;  . COLONOSCOPY WITH PROPOFOL N/A 03/14/2017   Procedure: COLONOSCOPY WITH PROPOFOL;  Surgeon: Lollie Sails, MD;  Location: [facility] ENDOSCOPY;  Service: Endoscopy;  Laterality: N/A;  . HEMORRHOID SURGERY    . LITHOTRIPSY    . TONSILLECTOMY      Medical History: Past Medical History:  Diagnosis Date  . Alcohol abuse   . Benzodiazepine dependence ([place])   . Benzodiazepine withdrawal ([place])   . Chronic pain in right foot   . Depression 08/19/2017  . Hepatitis C   . Hypertension   . Seizures ([place]) 2010  . Sleep apnea   . Syncope    questionable vasovagal    Family History: Family History  Problem Relation Age of  Onset  . Arthritis Mother   . Hypertension Mother   . Cancer Father   . Kidney disease Father   . Alcohol abuse Paternal Aunt   . Depression Maternal Grandfather     Social History   Socioeconomic History  . Marital status: Divorced    Spouse name: Not on file  . Number of children: Not on file  . Years of education: Not on file  . Highest education level: Not on file  Occupational History  . Not on file  Social Needs  . Financial resource strain: Not on file  . Food insecurity    Worry: Not on file    Inability: Not on file  . Transportation needs    Medical: Not on file    Non-medical: Not on file  Tobacco Use   . Smoking status: Former Smoker    Packs/day: 0.50    Types: Cigarettes    Quit date: 05/06/2017    Years since quitting: 1.4  . Smokeless tobacco: Never Used  Substance and Sexual Activity  . Alcohol use: No    Alcohol/week: 0.0 standard drinks  . Drug use: No  . Sexual activity: Not on file  Lifestyle  . Physical activity    Days per week: Not on file    Minutes per session: Not on file  . Stress: Not on file  Relationships  . Social Herbalist on phone: Not on file    Gets together: Not on file    Attends religious service: Not on file    Active member of club or organization: Not on file    Attends meetings of clubs or organizations: Not on file    Relationship status: Not on file  . Intimate partner violence    Fear of current or ex partner: Not on file    Emotionally abused: Not on file    Physically abused: Not on file    Forced sexual activity: Not on file  Other Topics Concern  . Not on file  Social History Narrative  . Not on file      Review of Systems  Constitutional: Positive for fatigue. Negative for activity change, chills and unexpected weight change.  HENT: Positive for mouth sores. Negative for congestion, rhinorrhea, sneezing and sore throat.        Persistent mouth infection.  Recently saw ENT. They don't believe this is thrush recently took bacterial swabs without results as of yet.   Respiratory: Negative for cough, chest tightness, shortness of breath and wheezing.   Cardiovascular: Negative for chest pain and palpitations.  Gastrointestinal: Negative for abdominal pain, constipation, diarrhea, nausea and vomiting.  Endocrine: Negative for cold intolerance, heat intolerance, polydipsia and polyuria.  Musculoskeletal: Positive for back pain and myalgias. Negative for arthralgias, joint swelling and neck pain.       Bilateral foot pain, worse at night. Burning sensation in the feet.   Skin: Negative for rash.  Allergic/Immunologic:  Negative for environmental allergies.  Neurological: Negative for dizziness, tremors, numbness and headaches.  Hematological: Negative for adenopathy. Does not bruise/bleed easily.  Psychiatric/Behavioral: Positive for sleep disturbance. Negative for behavioral problems and suicidal ideas. The patient is nervous/anxious.     Today's Vitals   10/29/18 1145  BP: 139/71  Pulse: 73  Resp: 16  SpO2: 96%  Weight: 215 lb (97.5 kg)  Height: 5\' 9"  (1.753 m)   Body mass index is 31.75 kg/m.  Physical Exam Vitals signs and nursing note reviewed.  Constitutional:      General: He is not in acute distress.    Appearance: Normal appearance. He is well-developed. He is not diaphoretic.  HENT:     Head: Normocephalic and atraumatic.     Mouth/Throat:     Pharynx: No oropharyngeal exudate.  Eyes:     Conjunctiva/sclera: Conjunctivae normal.     Pupils: Pupils are equal, round, and reactive to light.  Neck:     Musculoskeletal: Normal range of motion and neck supple.     Thyroid: No thyromegaly.     Vascular: No carotid bruit or JVD.     Trachea: No tracheal deviation.  Cardiovascular:     Rate and Rhythm: Normal rate and regular rhythm.     Pulses: Normal pulses.     Heart sounds: Normal heart sounds. No murmur. No friction rub. No gallop.   Pulmonary:     Effort: Pulmonary effort is normal. No respiratory distress.     Breath sounds: Normal breath sounds. No wheezing or rales.  Chest:     Chest wall: No tenderness.  Abdominal:     General: Bowel sounds are normal.     Palpations: Abdomen is soft.     Tenderness: There is no abdominal tenderness. There is no guarding.  Musculoskeletal: Normal range of motion.     Comments: Moderate lower back pain, worse when bending and twisting at the waist. Grimacing when changing position. No visible or palpable abnormalities today.   Lymphadenopathy:     Cervical: No cervical adenopathy.  Skin:    General: Skin is warm and dry.   Neurological:     Mental Status: He is alert and oriented to person, place, and time. Mental status is at baseline.     Cranial Nerves: No cranial nerve deficit.  Psychiatric:        Attention and Perception: Attention and perception normal.        Mood and Affect: Mood is anxious. Affect is labile.        Speech: Speech normal.        Behavior: Behavior normal. Behavior is cooperative.        Thought Content: Thought content normal.        Cognition and Memory: Cognition and memory normal.        Judgment: Judgment normal.    Assessment/Plan: 1. Essential hypertension Stable. Continue bp medication as prescribed   2. Restless leg syndrome Increase ropinirole to 1mg  every evening. Reassess at next visit - rOPINIRole (REQUIP) 1 MG tablet; Take 1 tablet (1 mg total) by mouth at bedtime.  Dispense: 30 tablet; Refill: 3  3. Stomatitis and mucositis Reviewed lab results with the patient. All within normal limits. Continue swish and swallow suspension as prescribed. Follow up with ENT as scheduled.   4. Primary insomnia Improved. Continue hydroxyzine as needed and as prescribed   General Counseling: [place] understanding of the findings of todays visit and agrees with plan of treatment. I have discussed any further diagnostic evaluation that may be needed or ordered today. We also reviewed his medications today. he has been encouraged to call the office with any questions or concerns that should arise related to todays visit.  This patient was seen by [place] with Dr Lavera Guise as a part of collaborative care agreement  Meds ordered this encounter  Medications  . rOPINIRole (REQUIP) 1 MG tablet    Sig: Take 1 tablet (1 mg total) by mouth at bedtime.  Dispense:  30 tablet    Refill:  3    Increased dose    Order Specific Question:   Supervising Provider    Answer:   Lavera Guise [[id number]]    Time spent: 54 Minutes      Dr Lavera Guise Internal medicine

## 2018-12-10 ENCOUNTER — Other Ambulatory Visit: Payer: Self-pay

## 2018-12-10 DIAGNOSIS — G2581 Restless legs syndrome: Secondary | ICD-10-CM

## 2019-01-12 ENCOUNTER — Other Ambulatory Visit: Payer: Self-pay

## 2019-01-12 DIAGNOSIS — F5101 Primary insomnia: Secondary | ICD-10-CM

## 2019-01-12 DIAGNOSIS — M5441 Lumbago with sciatica, right side: Secondary | ICD-10-CM

## 2019-01-12 DIAGNOSIS — G2581 Restless legs syndrome: Secondary | ICD-10-CM

## 2019-01-12 MED ORDER — HYDROXYZINE HCL 25 MG PO TABS: ORAL_TABLET | ORAL | 2 refills | Status: DC

## 2019-01-12 MED ORDER — CYCLOBENZAPRINE HCL 5 MG PO TABS: 5.0000 mg | ORAL_TABLET | Freq: Two times a day (BID) | ORAL | 2 refills | Status: DC | PRN

## 2019-01-12 MED ORDER — ROPINIROLE HCL 1 MG PO TABS: 1.0000 mg | ORAL_TABLET | Freq: Every day | ORAL | 3 refills | Status: DC

## 2019-01-15 ENCOUNTER — Ambulatory Visit: Payer: Medicare HMO | Admitting: Nurse Practitioner

## 2019-02-01 ENCOUNTER — Ambulatory Visit (INDEPENDENT_AMBULATORY_CARE_PROVIDER_SITE_OTHER): Payer: Medicare HMO | Admitting: Nurse Practitioner

## 2019-02-01 ENCOUNTER — Encounter: Payer: Self-pay | Admitting: Nurse Practitioner

## 2019-02-01 ENCOUNTER — Other Ambulatory Visit: Payer: Self-pay

## 2019-02-01 VITALS — BP 153/80 | HR 80 | Temp 98.2°F | Resp 16 | Ht 69.0 in | Wt 217.0 lb

## 2019-02-01 DIAGNOSIS — L209 Atopic dermatitis, unspecified: Secondary | ICD-10-CM | POA: Diagnosis not present

## 2019-02-01 DIAGNOSIS — G2581 Restless legs syndrome: Secondary | ICD-10-CM | POA: Diagnosis not present

## 2019-02-01 DIAGNOSIS — I10 Essential (primary) hypertension: Secondary | ICD-10-CM | POA: Diagnosis not present

## 2019-02-01 MED ORDER — TRIAMCINOLONE ACETONIDE 0.025 % EX CREA: 1.0000 "application " | TOPICAL_CREAM | Freq: Two times a day (BID) | CUTANEOUS | 2 refills | Status: DC

## 2019-02-01 MED ORDER — ROPINIROLE HCL 1 MG PO TABS: 1.0000 mg | ORAL_TABLET | Freq: Every day | ORAL | 3 refills | Status: DC

## 2019-02-01 NOTE — Progress Notes (Signed)
[facility] [place], [place] [address]  Internal MEDICINE  Office Visit Note  Patient Name: [name]  [id number]  [id number]  Date of Service: 02/14/2019  Chief Complaint  Patient presents with  . Hypertension  . Depression  . Numbness    tingling and numbness in feet has gotten worse    The patient is here for routine follow up. He is complaining of tingling and numbness in both feet. We have tried gabapentin and lyrica in the past. These medications have not helped. He does have chronic issues with the right foot and ankle. He is learning to cope with the pain and is looking to get a stationary bike so he can exercise without putting weight on his foot. Blood pressure is mildly elevated today. He is taking his medication as prescribed.       Current Medication: Outpatient Encounter Medications as of 02/01/2019  Medication Sig  . amLODipine (NORVASC) 5 MG tablet Take 1 tablet (5 mg total) by mouth daily.  Marland Kitchen antiseptic oral rinse (BIOTENE) LIQD 15 mLs by Mouth Rinse route as needed for dry mouth.  Marland Kitchen aspirin EC 81 MG tablet Take 81 mg by mouth daily.  . clotrimazole (MYCELEX) 10 MG troche   . cyclobenzaprine (FLEXERIL) 5 MG tablet Take 1 tablet (5 mg total) by mouth 2 (two) times daily as needed for muscle spasms.  . fluconazole (DIFLUCAN) 150 MG tablet Take 1 tablet po once weekly  . hydrOXYzine (ATARAX/VISTARIL) 25 MG tablet Take 1 to 2 tablets po QHS prn insomnia/anxiety  . itraconazole (SPORANOX) 10 MG/ML solution Take 20 mLs (200 mg total) by mouth daily.  Marland Kitchen MAGNESIUM PO Take by mouth.  . Multiple Vitamin (MULTIVITAMIN WITH MINERALS) TABS tablet Take 1 tablet by mouth daily.  Marland Kitchen nystatin (MYCOSTATIN) 100000 UNIT/ML suspension Take 5 mLs (500,000 Units total) by mouth 4 (four) times daily.  . Oreg-Peppermint-Thyme-Gldnseal (YEAST FORMULA PO) Take by mouth. Yeast cleanse  6 pills a day  . rOPINIRole (REQUIP) 1 MG tablet Take 1 tablet (1  mg total) by mouth at bedtime.  . [DISCONTINUED] rOPINIRole (REQUIP) 1 MG tablet Take 1 tablet (1 mg total) by mouth at bedtime.  . triamcinolone (KENALOG) 0.025 % cream Apply 1 application topically 2 (two) times daily.   No facility-administered encounter medications on file as of 02/01/2019.     Surgical History: Past Surgical History:  Procedure Laterality Date  . ANKLE FRACTURE SURGERY Right   . COLONOSCOPY WITH PROPOFOL N/A 12/16/2016   Procedure: COLONOSCOPY WITH PROPOFOL;  Surgeon: Lollie Sails, MD;  Location: [facility] ENDOSCOPY;  Service: Endoscopy;  Laterality: N/A;  . COLONOSCOPY WITH PROPOFOL N/A 03/14/2017   Procedure: COLONOSCOPY WITH PROPOFOL;  Surgeon: Lollie Sails, MD;  Location: [facility] ENDOSCOPY;  Service: Endoscopy;  Laterality: N/A;  . HEMORRHOID SURGERY    . LITHOTRIPSY    . TONSILLECTOMY      Medical History: Past Medical History:  Diagnosis Date  . Alcohol abuse   . Benzodiazepine dependence ([place])   . Benzodiazepine withdrawal ([place])   . Chronic pain in right foot   . Depression 08/19/2017  . Hepatitis C   . Hypertension   . Seizures ([place]) 2010  . Sleep apnea   . Syncope    questionable vasovagal    Family History: Family History  Problem Relation Age of Onset  . Arthritis Mother   . Hypertension Mother   . Cancer Father   . Kidney disease Father   .  Alcohol abuse Paternal Aunt   . Depression Maternal Grandfather     Social History   Socioeconomic History  . Marital status: Divorced    Spouse name: Not on file  . Number of children: Not on file  . Years of education: Not on file  . Highest education level: Not on file  Occupational History  . Not on file  Social Needs  . Financial resource strain: Not on file  . Food insecurity    Worry: Not on file    Inability: Not on file  . Transportation needs    Medical: Not on file    Non-medical: Not on file  Tobacco Use  . Smoking status: Former Smoker    Packs/day: 0.50    Types:  Cigarettes    Quit date: 05/06/2017    Years since quitting: 1.7  . Smokeless tobacco: Never Used  Substance and Sexual Activity  . Alcohol use: No    Alcohol/week: 0.0 standard drinks  . Drug use: No  . Sexual activity: Not on file  Lifestyle  . Physical activity    Days per week: Not on file    Minutes per session: Not on file  . Stress: Not on file  Relationships  . Social Herbalist on phone: Not on file    Gets together: Not on file    Attends religious service: Not on file    Active member of club or organization: Not on file    Attends meetings of clubs or organizations: Not on file    Relationship status: Not on file  . Intimate partner violence    Fear of current or ex partner: Not on file    Emotionally abused: Not on file    Physically abused: Not on file    Forced sexual activity: Not on file  Other Topics Concern  . Not on file  Social History Narrative  . Not on file      Review of Systems  Constitutional: Negative for activity change, chills, fatigue and unexpected weight change.  HENT: Positive for mouth sores. Negative for congestion, rhinorrhea, sneezing and sore throat.        Persistent mouth infection.  Recently saw ENT. They don't believe this is thrush recently took bacterial swabs without results as of yet.   Respiratory: Negative for cough, chest tightness, shortness of breath and wheezing.   Cardiovascular: Negative for chest pain and palpitations.  Gastrointestinal: Negative for abdominal pain, constipation, diarrhea, nausea and vomiting.  Endocrine: Negative for cold intolerance, heat intolerance, polydipsia and polyuria.  Musculoskeletal: Positive for back pain and myalgias. Negative for arthralgias, joint swelling and neck pain.       Bilateral foot pain, worse at night. Burning sensation in the feet.   Skin: Negative for rash.  Allergic/Immunologic: Negative for environmental allergies.  Neurological: Negative for dizziness,  tremors, numbness and headaches.  Hematological: Negative for adenopathy. Does not bruise/bleed easily.  Psychiatric/Behavioral: Positive for sleep disturbance. Negative for behavioral problems and suicidal ideas. The patient is nervous/anxious.     Today's Vitals   02/01/19 1150  BP: (!) 153/80  Pulse: 80  Resp: 16  Temp: 98.2 F (36.8 C)  SpO2: 96%  Weight: 217 lb (98.4 kg)  Height: 5\' 9"  (1.753 m)   Body mass index is 32.05 kg/m.  Physical Exam Vitals signs and nursing note reviewed.  Constitutional:      General: He is not in acute distress.    Appearance: Normal appearance.  He is well-developed. He is not diaphoretic.  HENT:     Head: Normocephalic and atraumatic.     Nose: Nose normal.     Mouth/Throat:     Pharynx: No oropharyngeal exudate.  Eyes:     Conjunctiva/sclera: Conjunctivae normal.     Pupils: Pupils are equal, round, and reactive to light.  Neck:     Musculoskeletal: Normal range of motion and neck supple.     Thyroid: No thyromegaly.     Vascular: No carotid bruit or JVD.     Trachea: No tracheal deviation.  Cardiovascular:     Rate and Rhythm: Normal rate and regular rhythm.     Pulses: Normal pulses.     Heart sounds: Normal heart sounds. No murmur. No friction rub. No gallop.   Pulmonary:     Effort: Pulmonary effort is normal. No respiratory distress.     Breath sounds: Normal breath sounds. No wheezing or rales.  Chest:     Chest wall: No tenderness.  Abdominal:     General: Bowel sounds are normal.     Palpations: Abdomen is soft.     Tenderness: There is no abdominal tenderness. There is no guarding.  Musculoskeletal: Normal range of motion.     Comments: Moderate lower back pain, worse when bending and twisting at the waist. Grimacing when changing position. No visible or palpable abnormalities today.   Lymphadenopathy:     Cervical: No cervical adenopathy.  Skin:    General: Skin is warm and dry.  Neurological:     Mental Status: He  is alert and oriented to person, place, and time. Mental status is at baseline.     Cranial Nerves: No cranial nerve deficit.  Psychiatric:        Attention and Perception: Attention and perception normal.        Mood and Affect: Mood is anxious. Affect is labile.        Speech: Speech normal.        Behavior: Behavior normal. Behavior is cooperative.        Thought Content: Thought content normal.        Cognition and Memory: Cognition and memory normal.        Judgment: Judgment normal.   Assessment/Plan: 1. Essential hypertension bp stable. Continue bp medication as prescribed   2. Restless leg syndrome Continue ropinirole 1mg  at bedtime to help with restless legs. Refill provided today.  - rOPINIRole (REQUIP) 1 MG tablet; Take 1 tablet (1 mg total) by mouth at bedtime.  Dispense: 30 tablet; Refill: 3  3. Atopic dermatitis, unspecified type May use triamcinolone 0.025% cream twice daily.  - triamcinolone (KENALOG) 0.025 % cream; Apply 1 application topically 2 (two) times daily.  Dispense: 80 g; Refill: 2  General Counseling: [place] verbalizes understanding of the findings of todays visit and agrees with plan of treatment. I have discussed any further diagnostic evaluation that may be needed or ordered today. We also reviewed his medications today. he has been encouraged to call the office with any questions or concerns that should arise related to todays visit.  This patient was seen by [place] with Dr Lavera Guise as a part of collaborative care agreement  Meds ordered this encounter  Medications  . rOPINIRole (REQUIP) 1 MG tablet    Sig: Take 1 tablet (1 mg total) by mouth at bedtime.    Dispense:  30 tablet    Refill:  3    Patient  should be taking 1mg  tablet at bedtime.    Order Specific Question:   Supervising Provider    Answer:   Lavera Guise [id number]  . triamcinolone (KENALOG) 0.025 % cream    Sig: Apply 1 application topically 2 (two) times  daily.    Dispense:  80 g    Refill:  2    Order Specific Question:   Supervising Provider    Answer:   Lavera Guise [id number]    Time spent: 56 Minutes      Dr Lavera Guise Internal medicine

## 2019-02-14 DIAGNOSIS — L209 Atopic dermatitis, unspecified: Secondary | ICD-10-CM | POA: Insufficient documentation

## 2019-03-02 ENCOUNTER — Other Ambulatory Visit: Payer: Self-pay

## 2019-03-02 ENCOUNTER — Encounter: Payer: Self-pay | Admitting: Nurse Practitioner

## 2019-03-02 ENCOUNTER — Ambulatory Visit (INDEPENDENT_AMBULATORY_CARE_PROVIDER_SITE_OTHER): Payer: Medicare HMO | Admitting: Nurse Practitioner

## 2019-03-02 VITALS — BP 161/72 | HR 74 | Temp 97.5°F | Resp 16 | Ht 69.0 in | Wt 219.0 lb

## 2019-03-02 DIAGNOSIS — I10 Essential (primary) hypertension: Secondary | ICD-10-CM | POA: Diagnosis not present

## 2019-03-02 DIAGNOSIS — G609 Hereditary and idiopathic neuropathy, unspecified: Secondary | ICD-10-CM | POA: Diagnosis not present

## 2019-03-02 DIAGNOSIS — L209 Atopic dermatitis, unspecified: Secondary | ICD-10-CM | POA: Diagnosis not present

## 2019-03-02 DIAGNOSIS — L03811 Cellulitis of head [any part, except face]: Secondary | ICD-10-CM

## 2019-03-02 DIAGNOSIS — L02811 Cutaneous abscess of head [any part, except face]: Secondary | ICD-10-CM | POA: Diagnosis not present

## 2019-03-02 MED ORDER — DOXYCYCLINE HYCLATE 100 MG PO TABS: 100.0000 mg | ORAL_TABLET | Freq: Two times a day (BID) | ORAL | 0 refills | Status: DC

## 2019-03-02 MED ORDER — EUCRISA 2 % EX OINT: 1.0000 "application " | TOPICAL_OINTMENT | Freq: Two times a day (BID) | CUTANEOUS | 0 refills | Status: DC | PRN

## 2019-03-02 MED ORDER — GABAPENTIN 100 MG PO CAPS: 100.0000 mg | ORAL_CAPSULE | Freq: Three times a day (TID) | ORAL | 3 refills | Status: DC

## 2019-03-02 NOTE — Progress Notes (Signed)
[facility] [place], [place] [address]  Internal MEDICINE  Office Visit Note  Patient Name: [name]  [id number]  PD:[id number]  Date of Service: 03/02/2019   Pt is here for a sick visit.  Chief Complaint  Patient presents with  . Otitis Media    pain in left ear and radiating back to head, swollen externally as well   . Medication Management    wants to start taking something for neuropathy burning and pain      The patinet is here for sick visit. He is complaining of severe pain in left ear. Started on Saturday. Has started to radiate into the left neck and into the left aspect of the neck. Pain is severe. States it "drops him to his knees."  He denies headache or fever, just the shooting pain across his ear and down the neck. Denies nasal congestion. He has been using OTC ear drops to help but he feels like this is just making it worse.  He would like to start back on gabapentin to help the neuropathy in his feet. Has been on this in the past and did well, just did not get as much relief from pain as he would have liked.  He has also brought paperwork with him which needs to be filled out. He is trying to apply for home care aid through his insurance and needs provider to fill out some of the paperwork to help get this approved.        Current Medication:  Outpatient Encounter Medications as of 03/02/2019  Medication Sig  . amLODipine (NORVASC) 5 MG tablet Take 1 tablet (5 mg total) by mouth daily.  Marland Kitchen antiseptic oral rinse (BIOTENE) LIQD 15 mLs by Mouth Rinse route as needed for dry mouth.  Marland Kitchen aspirin EC 81 MG tablet Take 81 mg by mouth daily.  . clotrimazole (MYCELEX) 10 MG troche   . cyclobenzaprine (FLEXERIL) 5 MG tablet Take 1 tablet (5 mg total) by mouth 2 (two) times daily as needed for muscle spasms.  . fluconazole (DIFLUCAN) 150 MG tablet Take 1 tablet po once weekly  . hydrOXYzine (ATARAX/VISTARIL) 25 MG tablet Take 1 to 2  tablets po QHS prn insomnia/anxiety  . itraconazole (SPORANOX) 10 MG/ML solution Take 20 mLs (200 mg total) by mouth daily.  Marland Kitchen MAGNESIUM PO Take by mouth.  . Multiple Vitamin (MULTIVITAMIN WITH MINERALS) TABS tablet Take 1 tablet by mouth daily.  Marland Kitchen nystatin (MYCOSTATIN) 100000 UNIT/ML suspension Take 5 mLs (500,000 Units total) by mouth 4 (four) times daily.  . Oreg-Peppermint-Thyme-Gldnseal (YEAST FORMULA PO) Take by mouth. Yeast cleanse  6 pills a day  . rOPINIRole (REQUIP) 1 MG tablet Take 1 tablet (1 mg total) by mouth at bedtime.  . triamcinolone (KENALOG) 0.025 % cream Apply 1 application topically 2 (two) times daily.  Marland Kitchen doxycycline (VIBRA-TABS) 100 MG tablet Take 1 tablet (100 mg total) by mouth 2 (two) times daily.  Marland Kitchen gabapentin (NEURONTIN) 100 MG capsule Take 1 capsule (100 mg total) by mouth 3 (three) times daily.   No facility-administered encounter medications on file as of 03/02/2019.       Medical History: Past Medical History:  Diagnosis Date  . Alcohol abuse   . Benzodiazepine dependence ([place])   . Benzodiazepine withdrawal ([place])   . Chronic pain in right foot   . Depression 08/19/2017  . Hepatitis C   . Hypertension   . Seizures ([place]) 2010  . Sleep apnea   .  Syncope    questionable vasovagal     Today's Vitals   03/02/19 0834  BP: (!) 161/72  Pulse: 74  Resp: 16  Temp: (!) 97.5 F (36.4 C)  SpO2: 96%  Weight: 219 lb (99.3 kg)  Height: 5\' 9"  (1.753 m)   Body mass index is 32.34 kg/m.  Review of Systems  Constitutional: Positive for activity change. Negative for chills, fatigue, fever and unexpected weight change.  HENT: Positive for ear discharge, ear pain and facial swelling. Negative for congestion, postnasal drip, rhinorrhea, sinus pressure, sneezing and sore throat.   Respiratory: Negative for cough, chest tightness, shortness of breath and wheezing.   Cardiovascular: Negative for chest pain and palpitations.       Blood pressure elevated today.    Gastrointestinal: Negative for abdominal pain, constipation, diarrhea, nausea and vomiting.  Musculoskeletal: Positive for arthralgias and myalgias. Negative for back pain, joint swelling and neck pain.       Bilateral foot pain which is chronic in nature.   Skin: Negative for rash.       falky and dry, itchy, red skin around the earlobe and behind the left ear. Stretching down the left side of the neck.   Neurological: Positive for weakness and numbness. Negative for tremors.  Hematological: Positive for adenopathy. Does not bruise/bleed easily.  Psychiatric/Behavioral: Negative for behavioral problems (Depression), sleep disturbance and suicidal ideas. The patient is not nervous/anxious.     Physical Exam Vitals signs and nursing note reviewed.  Constitutional:      General: He is not in acute distress.    Appearance: Normal appearance. He is well-developed. He is not diaphoretic.  HENT:     Head: Normocephalic and atraumatic.     Right Ear: Swelling present. Tympanic membrane is bulging.     Left Ear: Swelling and tenderness present. Tympanic membrane is erythematous and bulging.     Ears:     Comments: Redness and tenderness of the left ear lobe and behind the left ear. Stretches down to left upper aspect of the neck.     Mouth/Throat:     Pharynx: No oropharyngeal exudate.  Eyes:     Pupils: Pupils are equal, round, and reactive to light.  Neck:     Musculoskeletal: Normal range of motion and neck supple.     Thyroid: No thyromegaly.     Vascular: No JVD.     Trachea: No tracheal deviation.  Cardiovascular:     Rate and Rhythm: Normal rate and regular rhythm.     Heart sounds: Normal heart sounds. No murmur. No friction rub. No gallop.   Pulmonary:     Effort: Pulmonary effort is normal. No respiratory distress.     Breath sounds: Normal breath sounds. No wheezing or rales.  Chest:     Chest wall: No tenderness.  Abdominal:     Palpations: Abdomen is soft.   Musculoskeletal: Normal range of motion.     Comments: Bilateral foot pain which is chronic in nature.   Lymphadenopathy:     Cervical: Cervical adenopathy present.  Skin:    General: Skin is warm and dry.     Comments: Flaky, dry, red, rough skin at the inferior aspct of the ear lobe. Tender to touch.   Neurological:     Mental Status: He is alert and oriented to person, place, and time.     Cranial Nerves: No cranial nerve deficit.  Psychiatric:        Behavior: Behavior normal.  Thought Content: Thought content normal.        Judgment: Judgment normal.    Assessment/Plan: 1. Cellulitis and abscess of head Ear and earlobe. Start doxycycline 100mg  twice daily for 10 days. Keep skin clean and dry.  - doxycycline (VIBRA-TABS) 100 MG tablet; Take 1 tablet (100 mg total) by mouth 2 (two) times daily.  Dispense: 20 tablet; Refill: 0  2. Atopic dermatitis, unspecified type eucrisa may be applied to affected areas twice daily when needed. Samples were provided today.   3. Hereditary and idiopathic peripheral neuropathy Restart gabapentin 100mg . Advised he start with single dose in the evening. May titrate, slowly, to three times daily as needed and as tolerated.  - gabapentin (NEURONTIN) 100 MG capsule; Take 1 capsule (100 mg total) by mouth 3 (three) times daily.  Dispense: 90 capsule; Refill: 3  4. Essential hypertension Generally stable. Continue bp medication as prescribed   General Counseling: cayton dafoe understanding of the findings of todays visit and agrees with plan of treatment. I have discussed any further diagnostic evaluation that may be needed or ordered today. We also reviewed his medications today. he has been encouraged to call the office with any questions or concerns that should arise related to todays visit.    Counseling:  This patient was seen by [place] with Dr Lavera Guise as a part of collaborative care  agreement  Paperwork to be completed and returned to the patient when finished.    Meds ordered this encounter  Medications  . doxycycline (VIBRA-TABS) 100 MG tablet    Sig: Take 1 tablet (100 mg total) by mouth 2 (two) times daily.    Dispense:  20 tablet    Refill:  0    Order Specific Question:   Supervising Provider    Answer:   Lavera Guise [id number]  . gabapentin (NEURONTIN) 100 MG capsule    Sig: Take 1 capsule (100 mg total) by mouth 3 (three) times daily.    Dispense:  90 capsule    Refill:  3    Order Specific Question:   Supervising Provider    Answer:   Lavera Guise [id number]    Time spent: 25 Minutes

## 2019-03-10 ENCOUNTER — Telehealth: Payer: Self-pay

## 2019-03-10 NOTE — Telephone Encounter (Signed)
Patient paperwork up front and ready for pick up. [name]

## 2019-03-22 ENCOUNTER — Ambulatory Visit (INDEPENDENT_AMBULATORY_CARE_PROVIDER_SITE_OTHER): Payer: Medicare HMO | Admitting: Nurse Practitioner

## 2019-03-22 ENCOUNTER — Encounter: Payer: Self-pay | Admitting: Nurse Practitioner

## 2019-03-22 ENCOUNTER — Other Ambulatory Visit: Payer: Self-pay

## 2019-03-22 VITALS — BP 151/75 | HR 72 | Temp 98.0°F | Resp 16 | Ht 69.0 in | Wt 219.0 lb

## 2019-03-22 DIAGNOSIS — H6502 Acute serous otitis media, left ear: Secondary | ICD-10-CM | POA: Diagnosis not present

## 2019-03-22 MED ORDER — CIPROFLOXACIN-DEXAMETHASONE 0.3-0.1 % OT SUSP: 4.0000 [drp] | Freq: Two times a day (BID) | OTIC | 0 refills | Status: DC

## 2019-03-22 NOTE — Progress Notes (Addendum)
[facility] [place], [place] [address]  Internal MEDICINE  Office Visit Note  Patient Name: [name]  [id number]  PD:[id number]  Date of Service: 03/22/2019   Complaints/HPI  The patient is here for sick visit.    Chief Complaint  Patient presents with  . Ear Pain    left ear, unable to hear and dizziness   . Medication Management    gabapentin was making him sick    The patient is here for acute visit. He states that he started having sharp pain in the lett ear. He is also having trouble hearing out of the left ear. This is intermittent and started on Thursday. He was treated for abscess on the left earlobe on 03/02/2019. Took full course of antibiotics. Did feel better until this past week. Abscess is resolved.    Current Medication: Outpatient Encounter Medications as of 03/22/2019  Medication Sig  . amLODipine (NORVASC) 5 MG tablet Take 1 tablet (5 mg total) by mouth daily.  Marland Kitchen antiseptic oral rinse (BIOTENE) LIQD 15 mLs by Mouth Rinse route as needed for dry mouth.  Marland Kitchen aspirin EC 81 MG tablet Take 81 mg by mouth daily.  . clotrimazole (MYCELEX) 10 MG troche   . Crisaborole (EUCRISA) 2 % OINT Apply 1 application topically 2 (two) times daily as needed.  . cyclobenzaprine (FLEXERIL) 5 MG tablet Take 1 tablet (5 mg total) by mouth 2 (two) times daily as needed for muscle spasms.  Marland Kitchen doxycycline (VIBRA-TABS) 100 MG tablet Take 1 tablet (100 mg total) by mouth 2 (two) times daily.  . fluconazole (DIFLUCAN) 150 MG tablet Take 1 tablet po once weekly  . gabapentin (NEURONTIN) 100 MG capsule Take 1 capsule (100 mg total) by mouth 3 (three) times daily.  . hydrOXYzine (ATARAX/VISTARIL) 25 MG tablet Take 1 to 2 tablets po QHS prn insomnia/anxiety  . itraconazole (SPORANOX) 10 MG/ML solution Take 20 mLs (200 mg total) by mouth daily.  Marland Kitchen MAGNESIUM PO Take by mouth.  . Multiple Vitamin (MULTIVITAMIN WITH MINERALS) TABS tablet Take 1 tablet by mouth  daily.  Marland Kitchen nystatin (MYCOSTATIN) 100000 UNIT/ML suspension Take 5 mLs (500,000 Units total) by mouth 4 (four) times daily.  . Oreg-Peppermint-Thyme-Gldnseal (YEAST FORMULA PO) Take by mouth. Yeast cleanse  6 pills a day  . rOPINIRole (REQUIP) 1 MG tablet Take 1 tablet (1 mg total) by mouth at bedtime.  . triamcinolone (KENALOG) 0.025 % cream Apply 1 application topically 2 (two) times daily.  . ciprofloxacin-dexamethasone (CIPRODEX) OTIC suspension Place 4 drops into the right ear 2 (two) times daily.   No facility-administered encounter medications on file as of 03/22/2019.     Surgical History: Past Surgical History:  Procedure Laterality Date  . ANKLE FRACTURE SURGERY Right   . COLONOSCOPY WITH PROPOFOL N/A 12/16/2016   Procedure: COLONOSCOPY WITH PROPOFOL;  Surgeon: Lollie Sails, MD;  Location[facility] ENDOSCOPY;  Service: Endoscopy;  Laterality: N/A;  . COLONOSCOPY WITH PROPOFOL N/A 03/14/2017   Procedure: COLONOSCOPY WITH PROPOFOL;  Surgeon: Lollie Sails, MD;  Location[facility] ENDOSCOPY;  Service: Endoscopy;  Laterality: N/A;  . HEMORRHOID SURGERY    . LITHOTRIPSY    . TONSILLECTOMY      Medical History: Past Medical History:  Diagnosis Date  . Alcohol abuse   . Benzodiazepine dependence ([place])   . Benzodiazepine withdrawal ([place])   . Chronic pain in right foot   . Depression 08/19/2017  . Hepatitis C   . Hypertension   .  Seizures ([place]) 2010  . Sleep apnea   . Syncope    questionable vasovagal    Family History: Family History  Problem Relation Age of Onset  . Arthritis Mother   . Hypertension Mother   . Cancer Father   . Kidney disease Father   . Alcohol abuse Paternal Aunt   . Depression Maternal Grandfather     Social History   Socioeconomic History  . Marital status: Divorced    Spouse name: Not on file  . Number of children: Not on file  . Years of education: Not on file  . Highest education level: Not on file  Occupational History  . Not on file   Social Needs  . Financial resource strain: Not on file  . Food insecurity    Worry: Not on file    Inability: Not on file  . Transportation needs    Medical: Not on file    Non-medical: Not on file  Tobacco Use  . Smoking status: Former Smoker    Packs/day: 0.50    Types: Cigarettes    Quit date: 05/06/2017    Years since quitting: 1.8  . Smokeless tobacco: Never Used  Substance and Sexual Activity  . Alcohol use: No    Alcohol/week: 0.0 standard drinks  . Drug use: No  . Sexual activity: Not on file  Lifestyle  . Physical activity    Days per week: Not on file    Minutes per session: Not on file  . Stress: Not on file  Relationships  . Social Herbalist on phone: Not on file    Gets together: Not on file    Attends religious service: Not on file    Active member of club or organization: Not on file    Attends meetings of clubs or organizations: Not on file    Relationship status: Not on file  . Intimate partner violence    Fear of current or ex partner: Not on file    Emotionally abused: Not on file    Physically abused: Not on file    Forced sexual activity: Not on file  Other Topics Concern  . Not on file  Social History Narrative  . Not on file     Review of Systems  Constitutional: Negative for activity change, chills, fatigue, fever and unexpected weight change.  HENT: Positive for ear discharge, ear pain and tinnitus. Negative for congestion, facial swelling, postnasal drip, rhinorrhea, sinus pressure, sneezing and sore throat.        The patient states that he has shooting pain in his left ear.   Respiratory: Negative for cough, chest tightness, shortness of breath and wheezing.   Cardiovascular: Negative for chest pain and palpitations.       Blood pressure elevated today.   Gastrointestinal: Negative for abdominal pain, constipation, diarrhea, nausea and vomiting.  Musculoskeletal: Positive for arthralgias and myalgias. Negative for back pain,  joint swelling and neck pain.       Bilateral foot pain which is chronic in nature.   Skin: Negative for rash.  Neurological: Positive for weakness and numbness. Negative for tremors.  Hematological: Positive for adenopathy. Does not bruise/bleed easily.  Psychiatric/Behavioral: Negative for behavioral problems (Depression), sleep disturbance and suicidal ideas. The patient is not nervous/anxious.     Today's Vitals   03/22/19 1343  BP: (!) 151/75  Pulse: 72  Resp: 16  Temp: 98 F (36.7 C)  SpO2: 97%  Weight: 219 lb (99.3  kg)  Height: 5\' 9"  (1.753 m)   Body mass index is 32.34 kg/m.  Physical Exam Vitals signs and nursing note reviewed.  Constitutional:      General: He is not in acute distress.    Appearance: Normal appearance. He is well-developed. He is not diaphoretic.  HENT:     Head: Normocephalic and atraumatic.     Right Ear: External ear normal.     Left Ear: Decreased hearing noted. Swelling and tenderness present. Tympanic membrane is erythematous and bulging.     Ears:      Comments: Significant redness and inflammation to the ear canal of the left ear.  There is mild ear wax present in the right outer ear canal.     Mouth/Throat:     Pharynx: No oropharyngeal exudate.  Eyes:     Pupils: Pupils are equal, round, and reactive to light.  Neck:     Musculoskeletal: Normal range of motion and neck supple.     Thyroid: No thyromegaly.     Vascular: No JVD.     Trachea: No tracheal deviation.  Cardiovascular:     Rate and Rhythm: Normal rate and regular rhythm.     Heart sounds: Normal heart sounds. No murmur. No friction rub. No gallop.   Pulmonary:     Effort: Pulmonary effort is normal. No respiratory distress.     Breath sounds: Normal breath sounds. No wheezing or rales.  Chest:     Chest wall: No tenderness.  Abdominal:     Palpations: Abdomen is soft.  Musculoskeletal: Normal range of motion.  Lymphadenopathy:     Cervical: No cervical adenopathy.   Skin:    General: Skin is warm and dry.  Neurological:     Mental Status: He is alert and oriented to person, place, and time.     Cranial Nerves: No cranial nerve deficit.  Psychiatric:        Behavior: Behavior normal.        Thought Content: Thought content normal.        Judgment: Judgment normal.    Assessment/Plan: 1. Non-recurrent acute serous otitis media of left ear ciprodex ear drops. Insert four drops in left ear twice daily for next seven days. Advised him to contact office if this is no better in next 3 to 4 days. He voiced understanding and agrees with this plan.  - ciprofloxacin-dexamethasone (CIPRODEX) OTIC suspension; Place 4 drops into the right ear 2 (two) times daily.  Dispense: 7.5 mL; Refill: 0  General Counseling: [place] verbalizes understanding of the findings of todays visit and agrees with plan of treatment. I have discussed any further diagnostic evaluation that may be needed or ordered today. We also reviewed his medications today. he has been encouraged to call the office with any questions or concerns that should arise related to todays visit.    Counseling:  This patient was seen by [place] with Dr Lavera Guise as a part of collaborative care agreement  Meds ordered this encounter  Medications  . ciprofloxacin-dexamethasone (CIPRODEX) OTIC suspension    Sig: Place 4 drops into the right ear 2 (two) times daily.    Dispense:  7.5 mL    Refill:  0    Order Specific Question:   Supervising Provider    Answer:   Lavera Guise [id number]    Time spent: 15 Minutes

## 2019-04-01 ENCOUNTER — Other Ambulatory Visit: Payer: Self-pay

## 2019-04-01 ENCOUNTER — Emergency Department
Admission: EM | Admit: 2019-04-01 | Discharge: 2019-04-01 | Disposition: A | Discharge status: Home / Self Care | Payer: Medicare HMO | Attending: Emergency Medicine | Admitting: Emergency Medicine

## 2019-04-01 DIAGNOSIS — Z87891 Personal history of nicotine dependence: Secondary | ICD-10-CM | POA: Insufficient documentation

## 2019-04-01 DIAGNOSIS — I10 Essential (primary) hypertension: Secondary | ICD-10-CM | POA: Insufficient documentation

## 2019-04-01 DIAGNOSIS — H6692 Otitis media, unspecified, left ear: Secondary | ICD-10-CM | POA: Diagnosis not present

## 2019-04-01 DIAGNOSIS — Z79899 Other long term (current) drug therapy: Secondary | ICD-10-CM | POA: Diagnosis not present

## 2019-04-01 DIAGNOSIS — H9202 Otalgia, left ear: Secondary | ICD-10-CM | POA: Diagnosis not present

## 2019-04-01 DIAGNOSIS — H6690 Otitis media, unspecified, unspecified ear: Secondary | ICD-10-CM

## 2019-04-01 DIAGNOSIS — Z7982 Long term (current) use of aspirin: Secondary | ICD-10-CM | POA: Diagnosis not present

## 2019-04-01 MED ORDER — PREDNISONE 10 MG PO TABS: ORAL_TABLET | ORAL | 0 refills | Status: DC

## 2019-04-01 MED ORDER — CEFDINIR 300 MG PO CAPS: 300.0000 mg | ORAL_CAPSULE | Freq: Two times a day (BID) | ORAL | 0 refills | Status: DC

## 2019-04-01 NOTE — ED Notes (Signed)
See triage note  Presents with left ear pain which started about 1 month ago   Was seen by his PCP and placed on PO meds and then again ear drops  conts to have pain

## 2019-04-01 NOTE — ED Provider Notes (Signed)
[facility] Emergency Department Provider Note   ____________________________________________   First MD Initiated Contact with Patient 04/01/19 [phone]     (approximate)  I have reviewed the triage vital signs and the nursing notes.   HISTORY  Chief Complaint Otalgia   HPI [name] is a 68 y.o. male presents to the ED with complaint of left ear pain that started approximately 1 month ago.  He was seen by his PCP and placed on p.o. antibiotics and then later was given a prescription for eardrops.  Patient continues to have difficulties.  He states that he is waiting for referral to ENT but that his Medicaid card was miscoded and is having to be reassigned a local doctor.  He denies any fever, chills, nausea or vomiting.  He states that his initial ear pain started as a crackling popping sensation.  He denies any drainage from his ear.  He rates his pain as a 6 out of 10.      Past Medical History:  Diagnosis Date  . Alcohol abuse   . Benzodiazepine dependence ([place])   . Benzodiazepine withdrawal ([place])   . Chronic pain in right foot   . Depression 08/19/2017  . Hepatitis C   . Hypertension   . Seizures ([place]) 2010  . Sleep apnea   . Syncope    questionable vasovagal    Patient Active Problem List   Diagnosis Date Noted  . Non-recurrent acute serous otitis media of left ear 03/22/2019  . Cellulitis and abscess of head 03/02/2019  . Atopic dermatitis 02/14/2019  . Vitamin B12 deficiency 10/24/2018  . Stomatitis and mucositis 10/24/2018  . Low back pain due to bilateral sciatica 07/20/2018  . Restless leg syndrome 07/20/2018  . Mixed hyperlipidemia 03/03/2018  . Thrush, oral 12/17/2017  . Depression 08/19/2017  . History of hepatitis C 08/19/2017  . Syncope 08/19/2017  . Gastroesophageal reflux disease without esophagitis 08/19/2017  . Encounter for general adult medical examination with abnormal findings 08/06/2017  . Primary  insomnia 08/06/2017  . 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 Screening for colon cancer 08/06/2017  . Dysuria 08/06/2017  . Hereditary and idiopathic peripheral neuropathy 01/05/2015  . Complex regional pain syndrome of both lower extremities 01/05/2015  . Neuralgia 01/05/2015  . Seizures ([place]) 11/29/2013  . Benzodiazepine withdrawal ([place]) 11/29/2013  . Seizure disorder ([place]) 11/29/2013  . Alcohol abuse 11/29/2013  . Essential hypertension 11/29/2013  . Hepatitis C 11/29/2013  . Abnormal chest x-ray 11/29/2013  . Obesity 11/29/2013  . Tobacco use disorder 11/29/2013    Past Surgical History:  Procedure Laterality Date  . ANKLE FRACTURE SURGERY Right   . COLONOSCOPY WITH PROPOFOL N/A 12/16/2016   Procedure: COLONOSCOPY WITH PROPOFOL;  Surgeon: Lollie Sails, MD;  Location: [facility] ENDOSCOPY;  Service: Endoscopy;  Laterality: N/A;  . COLONOSCOPY WITH PROPOFOL N/A 03/14/2017   Procedure: COLONOSCOPY WITH PROPOFOL;  Surgeon: Lollie Sails, MD;  Location: [facility] ENDOSCOPY;  Service: Endoscopy;  Laterality: N/A;  . HEMORRHOID SURGERY    . LITHOTRIPSY    . TONSILLECTOMY      Prior to Admission medications   Medication Sig Start Date End Date Taking? Authorizing Provider  amLODipine (NORVASC) 5 MG tablet Take 1 tablet (5 mg total) by mouth daily. 09/01/18   Ronnell Freshwater, NP  antiseptic oral rinse (BIOTENE) LIQD 15 mLs by Mouth Rinse route as needed for dry mouth.    [provider]  aspirin EC 81 MG tablet  Take 81 mg by mouth daily.    [provider]  cefdinir (OMNICEF) 300 MG capsule Take 1 capsule (300 mg total) by mouth 2 (two) times daily. 04/01/19   Johnn Hai, PA-C  ciprofloxacin-dexamethasone (CIPRODEX) OTIC suspension Place 4 drops into the right ear 2 (two) times daily. 03/22/19   Ronnell Freshwater, NP  clotrimazole (MYCELEX) 10 MG troche  10/28/18   [provider]  Crisaborole (EUCRISA) 2 % OINT Apply 1 application topically 2  (two) times daily as needed. 03/02/19   Ronnell Freshwater, NP  cyclobenzaprine (FLEXERIL) 5 MG tablet Take 1 tablet (5 mg total) by mouth 2 (two) times daily as needed for muscle spasms. 01/12/19   Ronnell Freshwater, NP  doxycycline (VIBRA-TABS) 100 MG tablet Take 1 tablet (100 mg total) by mouth 2 (two) times daily. 03/02/19   Ronnell Freshwater, NP  fluconazole (DIFLUCAN) 150 MG tablet Take 1 tablet po once weekly 09/01/18   Ronnell Freshwater, NP  gabapentin (NEURONTIN) 100 MG capsule Take 1 capsule (100 mg total) by mouth 3 (three) times daily. 03/02/19   Ronnell Freshwater, NP  hydrOXYzine (ATARAX/VISTARIL) 25 MG tablet Take 1 to 2 tablets po QHS prn insomnia/anxiety 01/12/19   Ronnell Freshwater, NP  itraconazole (SPORANOX) 10 MG/ML solution Take 20 mLs (200 mg total) by mouth daily. 07/20/18   Ronnell Freshwater, NP  MAGNESIUM PO Take by mouth.    [provider]  Multiple Vitamin (MULTIVITAMIN WITH MINERALS) TABS tablet Take 1 tablet by mouth daily.    [provider]  nystatin (MYCOSTATIN) 100000 UNIT/ML suspension Take 5 mLs (500,000 Units total) by mouth 4 (four) times daily. 06/03/18   Kendell Bane, NP  Oreg-Peppermint-Thyme-Gldnseal (YEAST FORMULA PO) Take by mouth. Yeast cleanse  6 pills a day    [provider]  predniSONE (DELTASONE) 10 MG tablet Take 6 tablets  today, on day 2 take 5 tablets, day 3 take 4 tablets, day 4 take 3 tablets, day 5 take  2 tablets and 1 tablet the last day 04/01/19   Johnn Hai, PA-C  rOPINIRole (REQUIP) 1 MG tablet Take 1 tablet (1 mg total) by mouth at bedtime. 02/01/19   Ronnell Freshwater, NP  triamcinolone (KENALOG) 0.025 % cream Apply 1 application topically 2 (two) times daily. 02/01/19   Ronnell Freshwater, NP    Allergies Opana [oxymorphone hcl], Amoxicillin, Oxymorphone, Sulfa antibiotics, and Sulfur  Family History  Problem Relation Age of Onset  . Arthritis Mother   . Hypertension Mother   . Cancer Father   . Kidney  disease Father   . Alcohol abuse Paternal Aunt   . Depression Maternal Grandfather     Social History Social History   Tobacco Use  . Smoking status: Former Smoker    Packs/day: 0.50    Types: Cigarettes    Quit date: 05/06/2017    Years since quitting: 1.9  . Smokeless tobacco: Never Used  Substance Use Topics  . Alcohol use: No    Alcohol/week: 0.0 standard drinks  . Drug use: No    Review of Systems Constitutional: No fever/chills Eyes: No visual changes. ENT: No sore throat.  Positive for left ear pain. Cardiovascular: Denies chest pain. Respiratory: Denies shortness of breath. Skin: Negative for rash. Neurological: Negative for headaches, focal weakness or numbness. ____________________________________________   PHYSICAL EXAM:  VITAL SIGNS: ED Triage Vitals  Enc Vitals Group     BP 04/01/19 0834 (!) 170/86  Pulse Rate 04/01/19 0834 84     Resp 04/01/19 0834 17     Temp 04/01/19 0834 98.5 F (36.9 C)     Temp Source 04/01/19 0834 Oral     SpO2 04/01/19 0834 96 %     Weight 04/01/19 0831 219 lb (99.3 kg)     Height 04/01/19 0831 5\' 9"  (1.753 m)     Head Circumference --      Peak Flow --      Pain Score 04/01/19 0831 6     Pain Loc --      Pain Edu? --      Excl. in [place]? --     Constitutional: Alert and oriented. Well appearing and in no acute distress. Eyes: Conjunctivae are normal.  Head: Atraumatic. Nose: No congestion/rhinnorhea.  Right EAC is clear TM is dull.  Left EAC is clear with TM being cloudy, mildly injected with poor light reflex. Mouth/Throat: Mucous membranes are moist.  Oropharynx non-erythematous. Neck: No stridor.   Cardiovascular: Normal rate, regular rhythm. Grossly normal heart sounds.  Good peripheral circulation. Respiratory: Normal respiratory effort.  No retractions. Lungs CTAB. Musculoskeletal: Moves upper and lower extremities without any difficulty.  Normal gait was noted. Neurologic:  Normal speech and language. No  gross focal neurologic deficits are appreciated. No gait instability. Skin:  Skin is warm, dry and intact. No rash noted. Psychiatric: Mood and affect are normal. Speech and behavior are normal.  ____________________________________________   LABS (all labs ordered are listed, but only abnormal results are displayed)  Labs Reviewed - No data to display  PROCEDURES  Procedure(s) performed (including Critical Care):  Procedures  ____________________________________________   INITIAL IMPRESSION / ASSESSMENT AND PLAN / ED COURSE  As part of my medical decision making, I reviewed the following data within the electronic MEDICAL RECORD NUMBER Notes from prior ED visits and [place] Controlled Substance Database  68 year old male presents to the ED with complaint of left ear pain for approximately 1 month.  He has been treated with p.o. antibiotics and also eardrops with minimal improvement.  On exam there is physical findings to suggest an otitis media with effusion.  Patient was placed on prednisone and Omnicef.  He was given referral to [place] who is on-call for [facility].  He states that he will need to get his Medicaid card reassigned before being able to make that appointment.  ____________________________________________   FINAL CLINICAL IMPRESSION(S) / ED DIAGNOSES  Final diagnoses:  Subacute otitis media, unspecified otitis media type     ED Discharge Orders         Ordered    predniSONE (DELTASONE) 10 MG tablet     04/01/19 0927    cefdinir (OMNICEF) 300 MG capsule  2 times daily     04/01/19 [id number]           Note:  This document was prepared using Dragon voice recognition software and may include unintentional dictation errors.    Johnn Hai, PA-C 04/01/19 1530    Delman Kitten, MD 04/01/19 [phone]

## 2019-04-01 NOTE — Discharge Instructions (Signed)
Call make an appointment with [facility].  Dr.Vaught is the specialist that is on-call today.  Begin taking antibiotics and prednisone as directed.  Your primary care provider may be able to manage your ear problems until your Medicaid card has been changed to the local doctors.

## 2019-04-01 NOTE — ED Triage Notes (Signed)
Pt c/o left ear pain and loss of earring over the past month, states he was referred to ENT here but his medicaid card has him going to someone in [place].

## 2019-04-15 ENCOUNTER — Other Ambulatory Visit: Payer: Self-pay | Admitting: Otolaryngology

## 2019-04-15 DIAGNOSIS — D3705 Neoplasm of uncertain behavior of pharynx: Secondary | ICD-10-CM | POA: Diagnosis not present

## 2019-04-15 DIAGNOSIS — R69 Illness, unspecified: Secondary | ICD-10-CM | POA: Diagnosis not present

## 2019-04-15 DIAGNOSIS — J392 Other diseases of pharynx: Secondary | ICD-10-CM

## 2019-04-15 DIAGNOSIS — H6502 Acute serous otitis media, left ear: Secondary | ICD-10-CM | POA: Diagnosis not present

## 2019-04-15 DIAGNOSIS — H9202 Otalgia, left ear: Secondary | ICD-10-CM | POA: Diagnosis not present

## 2019-04-21 ENCOUNTER — Ambulatory Visit
Admission: RE | Admit: 2019-04-21 | Discharge: 2019-04-21 | Disposition: A | Discharge status: Home / Self Care | Payer: Medicare HMO | Source: Ambulatory Visit | Attending: Otolaryngology | Admitting: Otolaryngology

## 2019-04-21 ENCOUNTER — Other Ambulatory Visit
Admission: RE | Admit: 2019-04-21 | Discharge: 2019-04-21 | Disposition: A | Discharge status: Home / Self Care | Payer: Medicare HMO | Source: Ambulatory Visit | Attending: Otolaryngology | Admitting: Otolaryngology

## 2019-04-21 ENCOUNTER — Other Ambulatory Visit: Payer: Self-pay

## 2019-04-21 DIAGNOSIS — J392 Other diseases of pharynx: Secondary | ICD-10-CM

## 2019-04-21 DIAGNOSIS — R221 Localized swelling, mass and lump, neck: Secondary | ICD-10-CM | POA: Diagnosis not present

## 2019-04-21 LAB — BUN: BUN: 10 mg/dL (ref 8–23)

## 2019-04-21 LAB — CREATININE, SERUM
Creatinine, Ser: 1.03 mg/dL (ref 0.61–1.24)
GFR calc Af Amer: >60 mL/min (ref >60)
GFR calc non Af Amer: >60 mL/min (ref >60)

## 2019-04-21 MED ORDER — IOHEXOL 300 MG/ML  SOLN
75.0000 mL | Freq: Once | INTRAMUSCULAR | Status: AC | PRN
  Administered 2019-04-21: 10:00:00 75 mL via INTRAVENOUS

## 2019-04-22 ENCOUNTER — Other Ambulatory Visit: Payer: Self-pay

## 2019-04-22 ENCOUNTER — Encounter: Payer: Self-pay | Admitting: Otolaryngology

## 2019-04-23 ENCOUNTER — Encounter
Admission: RE | Admit: 2019-04-23 | Discharge: 2019-04-23 | Disposition: A | Discharge status: Home / Self Care | Payer: Medicare HMO | Source: Ambulatory Visit | Attending: Otolaryngology | Admitting: Otolaryngology

## 2019-04-23 DIAGNOSIS — Z20828 Contact with and (suspected) exposure to other viral communicable diseases: Secondary | ICD-10-CM | POA: Diagnosis not present

## 2019-04-23 DIAGNOSIS — Z01812 Encounter for preprocedural laboratory examination: Secondary | ICD-10-CM | POA: Diagnosis not present

## 2019-04-24 LAB — SARS CORONAVIRUS 2 (TAT 6-24 HRS): SARS Coronavirus 2: NEGATIVE

## 2019-04-25 NOTE — Anesthesia Preprocedure Evaluation (Addendum)
Anesthesia Evaluation  Patient identified by MRN, date of birth, ID band Patient awake    Reviewed: Allergy & Precautions, NPO status , Patient's Chart, lab work & pertinent test results  History of Anesthesia Complications Negative for: history of anesthetic complications  Airway Mallampati: III   Neck ROM: Full    Dental  (+) Edentulous Upper, Edentulous Lower   Pulmonary sleep apnea , former smoker (quit 01/2019),    Pulmonary exam normal breath sounds clear to auscultation       Cardiovascular hypertension, Normal cardiovascular exam Rhythm:Regular Rate:Normal     Neuro/Psych Seizures - (from benzo withdrawal),  PSYCHIATRIC DISORDERS Depression Alcohol abuse, reports last intake 4 days ago; hx benzodiazepine dependence, last used 2017  Neuromuscular disease (peripheral neuropathy)    GI/Hepatic negative GI ROS, (+) Hepatitis -, C  Endo/Other  negative endocrine ROS  Renal/GU negative Renal ROS     Musculoskeletal   Abdominal   Peds  Hematology negative hematology ROS (+)   Anesthesia Other Findings   Reproductive/Obstetrics                            Anesthesia Physical Anesthesia Plan  ASA: III  Anesthesia Plan: General   Post-op Pain Management:    Induction: Intravenous  PONV Risk Score and Plan: 2 and Dexamethasone, Ondansetron and Treatment may vary due to age or medical condition  Airway Management Planned: Oral ETT  Additional Equipment:   Intra-op Plan:   Post-operative Plan: Extubation in OR  Informed Consent: I have reviewed the patients History and Physical, chart, labs and discussed the procedure including the risks, benefits and alternatives for the proposed anesthesia with the patient or authorized representative who has indicated his/her understanding and acceptance.       Plan Discussed with: CRNA  Anesthesia Plan Comments:        Anesthesia  Quick Evaluation

## 2019-04-27 NOTE — Discharge Instructions (Signed)
[name] °DISCHARGE INSTRUCTIONS FOR MYRINGOTOMY AND TUBE INSERTION ° °[facility] °PAUL JUENGEL, M.D. °CHAPMAN T. MCQUEEN, M.D. °SCOTT BENNETT, M.D. °CREIGHTON VAUGHT, M.D. ° °Diet:   After surgery, the patient should take only liquids and foods as tolerated.  The patient may then have a regular diet after the effects of anesthesia have worn off, usually about four to six hours after surgery. ° °Activities:   The patient should rest until the effects of anesthesia have worn off.  After this, there are no restrictions on the normal daily activities. ° °Medications:   You will be given antibiotic drops to be used in the ears postoperatively.  It is recommended to use 4 drops 2 times a day for 4 days, then the drops should be saved for possible future use. ° °The tubes should not cause any discomfort to the patient, but if there is any question, Tylenol should be given according to the instructions for the age of the patient. ° °Other medications should be continued normally. ° °Precautions:   Should there be recurrent drainage after the tubes are placed, the drops should be used for approximately 3-4 days.  If it does not clear, you should call the ENT office. ° °Earplugs:   Earplugs are only needed for those who are going to be submerged under water.  When taking a bath or shower and using a cup or showerhead to rinse hair, it is not necessary to wear earplugs.  These come in a variety of fashions, all of which can be obtained at our office.  However, if one is not able to come by the office, then silicone plugs can be found at most pharmacies.  It is not advised to stick anything in the ear that is not approved as an earplug.  Silly putty is not to be used as an earplug.  Swimming is allowed in patients after ear tubes are inserted, however, they must wear earplugs if they are going to be submerged under water.  For those children who are going to be swimming a lot, it is  recommended to use a fitted ear mold, which can be made by our audiologist.  If discharge is noticed from the ears, this most likely represents an ear infection.  We would recommend getting your eardrops and using them as indicated above.  If it does not clear, then you should call the ENT office.  For follow up, the patient should return to the ENT office three weeks postoperatively and then every six months as required by the doctor. ° ° °General Anesthesia, Adult, Care After °This sheet gives you information about how to care for yourself after your procedure. Your health care provider may also give you more specific instructions. If you have problems or questions, contact your health care provider. °What can I expect after the procedure? °After the procedure, the following side effects are common: °· Pain or discomfort at the IV site. °· Nausea. °· Vomiting. °· Sore throat. °· Trouble concentrating. °· Feeling cold or chills. °· Weak or tired. °· Sleepiness and fatigue. °· Soreness and body aches. These side effects can affect parts of the body that were not involved in surgery. °Follow these instructions at home: ° °For at least 24 hours after the procedure: °· Have a responsible adult stay with you. It is important to have someone help care for you until you are awake and alert. °· Rest as needed. °· Do not: °? Participate in   activities in which you could fall or become injured. °? Drive. °? Use heavy machinery. °? Drink alcohol. °? Take sleeping pills or medicines that cause drowsiness. °? Make important decisions or sign legal documents. °? Take care of children on your own. °Eating and drinking °· Follow any instructions from your health care provider about eating or drinking restrictions. °· When you feel hungry, start by eating small amounts of foods that are soft and easy to digest (bland), such as toast. Gradually return to your regular diet. °· Drink enough fluid to keep your urine pale yellow. °· If  you vomit, rehydrate by drinking water, juice, or clear broth. °General instructions °· If you have sleep apnea, surgery and certain medicines can increase your risk for breathing problems. Follow instructions from your health care provider about wearing your sleep device: °? Anytime you are sleeping, including during daytime naps. °? While taking prescription pain medicines, sleeping medicines, or medicines that make you drowsy. °· Return to your normal activities as told by your health care provider. Ask your health care provider what activities are safe for you. °· Take over-the-counter and prescription medicines only as told by your health care provider. °· If you smoke, do not smoke without supervision. °· Keep all follow-up visits as told by your health care provider. This is important. °Contact a health care provider if: °· You have nausea or vomiting that does not get better with medicine. °· You cannot eat or drink without vomiting. °· You have pain that does not get better with medicine. °· You are unable to pass urine. °· You develop a skin rash. °· You have a fever. °· You have redness around your IV site that gets worse. °Get help right away if: °· You have difficulty breathing. °· You have chest pain. °· You have blood in your urine or stool, or you vomit blood. °Summary °· After the procedure, it is common to have a sore throat or nausea. It is also common to feel tired. °· Have a responsible adult stay with you for the first 24 hours after general anesthesia. It is important to have someone help care for you until you are awake and alert. °· When you feel hungry, start by eating small amounts of foods that are soft and easy to digest (bland), such as toast. Gradually return to your regular diet. °· Drink enough fluid to keep your urine pale yellow. °· Return to your normal activities as told by your health care provider. Ask your health care provider what activities are safe for you. °This  information is not intended to replace advice given to you by your health care provider. Make sure you discuss any questions you have with your health care provider. °Document Released: 08/05/2000 Document Revised: 05/02/2017 Document Reviewed: 12/13/2016 °Elsevier Patient Education © 2020 [employer] Inc[employer] ° °

## 2019-04-28 ENCOUNTER — Ambulatory Visit
Admission: RE | Admit: 2019-04-28 | Discharge: 2019-04-28 | Disposition: A | Discharge status: Home / Self Care | Payer: Medicare HMO | Attending: Otolaryngology | Admitting: Otolaryngology

## 2019-04-28 ENCOUNTER — Other Ambulatory Visit: Payer: Self-pay

## 2019-04-28 ENCOUNTER — Ambulatory Visit: Payer: Medicare HMO | Admitting: Anesthesiology

## 2019-04-28 ENCOUNTER — Telehealth: Payer: Self-pay

## 2019-04-28 ENCOUNTER — Encounter: Payer: Self-pay | Admitting: Otolaryngology

## 2019-04-28 ENCOUNTER — Encounter
Admission: RE | Disposition: A | Discharge status: Home / Self Care | Payer: Self-pay | Source: Home / Self Care | Attending: Otolaryngology

## 2019-04-28 DIAGNOSIS — E782 Mixed hyperlipidemia: Secondary | ICD-10-CM | POA: Insufficient documentation

## 2019-04-28 DIAGNOSIS — K219 Gastro-esophageal reflux disease without esophagitis: Secondary | ICD-10-CM | POA: Diagnosis not present

## 2019-04-28 DIAGNOSIS — Z87891 Personal history of nicotine dependence: Secondary | ICD-10-CM | POA: Insufficient documentation

## 2019-04-28 DIAGNOSIS — Z79899 Other long term (current) drug therapy: Secondary | ICD-10-CM | POA: Insufficient documentation

## 2019-04-28 DIAGNOSIS — F1010 Alcohol abuse, uncomplicated: Secondary | ICD-10-CM | POA: Diagnosis not present

## 2019-04-28 DIAGNOSIS — I10 Essential (primary) hypertension: Secondary | ICD-10-CM | POA: Diagnosis not present

## 2019-04-28 DIAGNOSIS — G2581 Restless legs syndrome: Secondary | ICD-10-CM | POA: Diagnosis not present

## 2019-04-28 DIAGNOSIS — R69 Illness, unspecified: Secondary | ICD-10-CM | POA: Diagnosis not present

## 2019-04-28 DIAGNOSIS — G4730 Sleep apnea, unspecified: Secondary | ICD-10-CM | POA: Insufficient documentation

## 2019-04-28 DIAGNOSIS — H6522 Chronic serous otitis media, left ear: Secondary | ICD-10-CM | POA: Diagnosis not present

## 2019-04-28 DIAGNOSIS — Z7982 Long term (current) use of aspirin: Secondary | ICD-10-CM | POA: Diagnosis not present

## 2019-04-28 DIAGNOSIS — E538 Deficiency of other specified B group vitamins: Secondary | ICD-10-CM | POA: Insufficient documentation

## 2019-04-28 DIAGNOSIS — G90523 Complex regional pain syndrome I of lower limb, bilateral: Secondary | ICD-10-CM | POA: Insufficient documentation

## 2019-04-28 DIAGNOSIS — B1920 Unspecified viral hepatitis C without hepatic coma: Secondary | ICD-10-CM | POA: Insufficient documentation

## 2019-04-28 DIAGNOSIS — I739 Peripheral vascular disease, unspecified: Secondary | ICD-10-CM | POA: Diagnosis not present

## 2019-04-28 DIAGNOSIS — H6592 Unspecified nonsuppurative otitis media, left ear: Secondary | ICD-10-CM | POA: Insufficient documentation

## 2019-04-28 DIAGNOSIS — F329 Major depressive disorder, single episode, unspecified: Secondary | ICD-10-CM | POA: Insufficient documentation

## 2019-04-28 DIAGNOSIS — Z7952 Long term (current) use of systemic steroids: Secondary | ICD-10-CM | POA: Insufficient documentation

## 2019-04-28 DIAGNOSIS — C119 Malignant neoplasm of nasopharynx, unspecified: Secondary | ICD-10-CM | POA: Insufficient documentation

## 2019-04-28 DIAGNOSIS — D3705 Neoplasm of uncertain behavior of pharynx: Secondary | ICD-10-CM | POA: Diagnosis not present

## 2019-04-28 DIAGNOSIS — C118 Malignant neoplasm of overlapping sites of nasopharynx: Secondary | ICD-10-CM | POA: Diagnosis not present

## 2019-04-28 HISTORY — PX: MYRINGOTOMY WITH TUBE PLACEMENT: SHX5663

## 2019-04-28 HISTORY — PX: NASOPHARYNGOSCOPY: SHX5210

## 2019-04-28 SURGERY — MYRINGOTOMY WITH TUBE PLACEMENT
Anesthesia: General

## 2019-04-28 MED ORDER — OXYMETAZOLINE HCL 0.05 % NA SOLN
NASAL | Status: DC | PRN
  Administered 2019-04-28: 1

## 2019-04-28 MED ORDER — CIPROFLOXACIN-DEXAMETHASONE 0.3-0.1 % OT SUSP
OTIC | Status: DC | PRN
  Administered 2019-04-28: 4 [drp] via OTIC

## 2019-04-28 MED ORDER — LACTATED RINGERS IV SOLN: INTRAVENOUS | Status: DC

## 2019-04-28 MED ORDER — LACTATED RINGERS IV SOLN
10.0000 mL/h | INTRAVENOUS | Status: DC
  Administered 2019-04-28: 08:00:00 10 mL/h via INTRAVENOUS

## 2019-04-28 MED ORDER — SUCCINYLCHOLINE CHLORIDE 20 MG/ML IJ SOLN
INTRAMUSCULAR | Status: DC | PRN
  Administered 2019-04-28: 100 mg via INTRAVENOUS

## 2019-04-28 MED ORDER — HYDROCODONE-ACETAMINOPHEN 5-325 MG PO TABS: 1.0000 | ORAL_TABLET | ORAL | 0 refills | Status: AC | PRN

## 2019-04-28 MED ORDER — OXYCODONE HCL 5 MG/5ML PO SOLN
5.0000 mg | Freq: Once | ORAL | Status: AC | PRN
  Administered 2019-04-28: 5 mg via ORAL

## 2019-04-28 MED ORDER — ACETAMINOPHEN 10 MG/ML IV SOLN: 1000.0000 mg | Freq: Once | INTRAVENOUS | Status: DC | PRN

## 2019-04-28 MED ORDER — OXYCODONE HCL 5 MG PO TABS: 5.0000 mg | ORAL_TABLET | Freq: Once | ORAL | Status: AC | PRN

## 2019-04-28 MED ORDER — ONDANSETRON HCL 4 MG PO TABS: 4.0000 mg | ORAL_TABLET | Freq: Three times a day (TID) | ORAL | 0 refills | Status: DC | PRN

## 2019-04-28 MED ORDER — MIDAZOLAM HCL 5 MG/5ML IJ SOLN
INTRAMUSCULAR | Status: DC | PRN
  Administered 2019-04-28: 2 mg via INTRAVENOUS

## 2019-04-28 MED ORDER — FENTANYL CITRATE (PF) 100 MCG/2ML IJ SOLN
INTRAMUSCULAR | Status: DC | PRN
  Administered 2019-04-28: 50 ug via INTRAVENOUS

## 2019-04-28 MED ORDER — PROPOFOL 10 MG/ML IV BOLUS
INTRAVENOUS | Status: DC | PRN
  Administered 2019-04-28: 200 mg via INTRAVENOUS

## 2019-04-28 MED ORDER — ONDANSETRON HCL 4 MG/2ML IJ SOLN
INTRAMUSCULAR | Status: DC | PRN
  Administered 2019-04-28: 4 mg via INTRAVENOUS

## 2019-04-28 MED ORDER — ONDANSETRON HCL 4 MG/2ML IJ SOLN: 4.0000 mg | Freq: Once | INTRAMUSCULAR | Status: DC | PRN

## 2019-04-28 MED ORDER — GLYCOPYRROLATE 0.2 MG/ML IJ SOLN
INTRAMUSCULAR | Status: DC | PRN
  Administered 2019-04-28: .1 mg via INTRAVENOUS

## 2019-04-28 MED ORDER — FENTANYL CITRATE (PF) 100 MCG/2ML IJ SOLN: 25.0000 ug | INTRAMUSCULAR | Status: DC | PRN

## 2019-04-28 MED ORDER — LIDOCAINE HCL (CARDIAC) PF 100 MG/5ML IV SOSY
PREFILLED_SYRINGE | INTRAVENOUS | Status: DC | PRN
  Administered 2019-04-28: 40 mg via INTRAVENOUS

## 2019-04-28 MED ORDER — DEXAMETHASONE SODIUM PHOSPHATE 4 MG/ML IJ SOLN
INTRAMUSCULAR | Status: DC | PRN
  Administered 2019-04-28: 4 mg via INTRAVENOUS

## 2019-04-28 SURGICAL SUPPLY — 13 items
BALL CTTN LRG ABS STRL LF (GAUZE/BANDAGES/DRESSINGS) ×2
BLADE MYR LANCE NRW W/HDL (BLADE) ×3 IMPLANT
CANISTER SUCT 1200ML W/VALVE (MISCELLANEOUS) ×3 IMPLANT
COAGULATOR SUCT 8FR VV (MISCELLANEOUS) ×1 IMPLANT
COTTONBALL LRG STERILE PKG (GAUZE/BANDAGES/DRESSINGS) ×3 IMPLANT
GLOVE BIO SURGEON STRL SZ7.5 (GLOVE) ×3 IMPLANT
STRAP BODY AND KNEE 60X3 (MISCELLANEOUS) ×3 IMPLANT
TOWEL OR 17X26 4PK STRL BLUE (TOWEL DISPOSABLE) ×3 IMPLANT
TUBE EAR ARMSTRONG HC 1.14X3.5 (OTOLOGIC RELATED) ×6 IMPLANT
TUBE EAR T 1.27X4.5 GO LF (OTOLOGIC RELATED) IMPLANT
TUBE EAR T 1.27X5.3 BFLY (OTOLOGIC RELATED) IMPLANT
TUBING CONN 6MMX3.1M (TUBING) ×1
TUBING SUCTION CONN 0.25 STRL (TUBING) ×2 IMPLANT

## 2019-04-28 NOTE — Op Note (Signed)
..  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  Left serous otitis media.  Procedure: With the patient in a comfortable supine position, general anesthesia was administered.  At an appropriate level, microscope and speculum were used to examine and clean the LEFT ear canal.  The findings were as described above.  An anterior inferior radial myringotomy incision was sharply executed.  Middle ear contents were suctioned clear with a size 5 otologic suction.  A PE tube was placed without difficulty using a Rosen pick and Animal nutritionist.  Ciprodex otic solution was instilled into the external canal, and insufflated into the middle ear.  A cotton ball was placed at the external meatus. Hemostasis was observed.  This side was completed.  Attention as this time was directed to the patient's nasopharyngoscopy.  The patient's bilateral nasal cavities were spray with AFRIN and bilateral nasal pledgets were placed for vasoconstriction.  This demonstrated a very narrow nasal cavity.  The left inferior turbinate was lateralized with a freer elevator for access to the nasopharynx.    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  Using ethmoid forceps, multiple biopsies were taken.  Using Bovie suction cautery, hemostasis was achieved.  Following this  The patient was returned to anesthesia, awakened, and transferred to recovery in stable condition.  Dispo:  PACU to home  Plan: Routine drop use and water precautions.  Await pathology.  Follow up in 1  week.   Jeannie Fend Ramsey Midgett 9:25 AM 04/28/2019

## 2019-04-28 NOTE — Transfer of Care (Signed)
Immediate Anesthesia Transfer of Care Note  Patient: [name]  Procedure(s) Performed: MYRINGOTOMY WITH TUBE PLACEMENT (Left ) NASOPHARYNGOSCOPY WITH BIOPSY (N/A )  Patient Location: PACU  Anesthesia Type: General  Level of Consciousness: awake, alert  and patient cooperative  Airway and Oxygen Therapy: Patient Spontanous Breathing and Patient connected to supplemental oxygen  Post-op Assessment: Post-op Vital signs reviewed, Patient's Cardiovascular Status Stable, Respiratory Function Stable, Patent Airway and No signs of Nausea or vomiting  Post-op Vital Signs: Reviewed and stable  Complications: No apparent anesthesia complications

## 2019-04-28 NOTE — H&P (Signed)
..  History and Physical paper copy reviewed and updated date of procedure and will be scanned into system.  Patient seen and examined.  

## 2019-04-28 NOTE — Anesthesia Procedure Notes (Signed)
Procedure Name: Intubation Date/Time: 04/28/2019 8:57 AM Performed by: Cameron Ali, CRNA Pre-anesthesia Checklist: Patient identified, Emergency Drugs available, Suction available, Patient being monitored and Timeout performed Patient Re-evaluated:Patient Re-evaluated prior to induction Oxygen Delivery Method: Circle system utilized Preoxygenation: Pre-oxygenation with 100% oxygen Induction Type: IV induction Ventilation: Mask ventilation without difficulty Laryngoscope Size: Mac and 4 Grade View: Grade II Tube type: Oral Tube size: 7.5 mm Number of attempts: 2 Placement Confirmation: ETT inserted through vocal cords under direct vision,  positive ETCO2 and breath sounds checked- equal and bilateral Secured at: 24 cm Tube secured with: Tape Dental Injury: Teeth and Oropharynx as per pre-operative assessment  Comments: DVL X 1 MAC 3 LIMITED VIEW. DVL X 1 MAC 4 AS CHARTED.

## 2019-04-28 NOTE — Anesthesia Postprocedure Evaluation (Signed)
Anesthesia Post Note  Patient: [name]  Procedure(s) Performed: MYRINGOTOMY WITH TUBE PLACEMENT (Left ) NASOPHARYNGOSCOPY WITH BIOPSY (N/A )     Patient location during evaluation: PACU Anesthesia Type: General Level of consciousness: awake and alert, oriented and patient cooperative Pain management: pain level controlled Vital Signs Assessment: post-procedure vital signs reviewed and stable Respiratory status: spontaneous breathing, nonlabored ventilation and respiratory function stable Cardiovascular status: blood pressure returned to baseline and stable Postop Assessment: adequate PO intake Anesthetic complications: no    Darrin Nipper

## 2019-04-28 NOTE — Telephone Encounter (Signed)
Called lmom informing patient of appointment. klh 

## 2019-04-29 ENCOUNTER — Encounter: Payer: Self-pay | Admitting: *Deleted

## 2019-04-30 ENCOUNTER — Encounter: Payer: Self-pay | Admitting: Adult Health

## 2019-04-30 ENCOUNTER — Ambulatory Visit (INDEPENDENT_AMBULATORY_CARE_PROVIDER_SITE_OTHER): Payer: Medicare HMO | Admitting: Adult Health

## 2019-04-30 ENCOUNTER — Other Ambulatory Visit: Payer: Self-pay

## 2019-04-30 DIAGNOSIS — E782 Mixed hyperlipidemia: Secondary | ICD-10-CM | POA: Diagnosis not present

## 2019-04-30 DIAGNOSIS — M5442 Lumbago with sciatica, left side: Secondary | ICD-10-CM

## 2019-04-30 DIAGNOSIS — M5441 Lumbago with sciatica, right side: Secondary | ICD-10-CM

## 2019-04-30 DIAGNOSIS — K219 Gastro-esophageal reflux disease without esophagitis: Secondary | ICD-10-CM | POA: Diagnosis not present

## 2019-04-30 DIAGNOSIS — I10 Essential (primary) hypertension: Secondary | ICD-10-CM | POA: Diagnosis not present

## 2019-04-30 MED ORDER — CYCLOBENZAPRINE HCL 5 MG PO TABS: 10.0000 mg | ORAL_TABLET | Freq: Two times a day (BID) | ORAL | 0 refills | Status: DC | PRN

## 2019-04-30 NOTE — Progress Notes (Signed)
[facility] [place], [place] [address]  Internal MEDICINE  Telephone Visit  Patient Name: [name]  [id number]  [id number]  Date of Service: 04/30/2019  I connected with the patient at 1333 by telephone and verified the patients identity using two identifiers.   I discussed the limitations, risks, security and privacy concerns of performing an evaluation and management service by telephone and the availability of in person appointments. I also discussed with the patient that there may be a patient responsible charge related to the service.  The patient expressed understanding and agrees to proceed.    Chief Complaint  Patient presents with  . Telephone Screen  . Telephone Assessment  . Depression    HPI  Pt is seen via telephone. Pt reports he is doing well.  He reports he was finally able to see ENT.  Dr. Pryor Ochoa, put a tube in his left ear, and biopsied a place in his nasopharynx.  He is awaiting follow up for the biopsy results.  His blood pressure has been stable, and he denies any issues. He does report some intermittent ongoing low back pain.  He uses flexeril, at times. And he is requesting refill at this time.    Current Medication: Outpatient Encounter Medications as of 04/30/2019  Medication Sig  . amLODipine (NORVASC) 5 MG tablet Take 1 tablet (5 mg total) by mouth daily.  Marland Kitchen antiseptic oral rinse (BIOTENE) LIQD 15 mLs by Mouth Rinse route as needed for dry mouth.  Marland Kitchen aspirin EC 81 MG tablet Take 81 mg by mouth daily.  Stasia Cavalier (EUCRISA) 2 % OINT Apply 1 application topically 2 (two) times daily as needed.  . gabapentin (NEURONTIN) 100 MG capsule Take 1 capsule (100 mg total) by mouth 3 (three) times daily.  Marland Kitchen HYDROcodone-acetaminophen (NORCO/VICODIN) 5-325 MG tablet Take 1 tablet by mouth every 4 (four) hours as needed for up to 7 days for moderate pain.  . hydrOXYzine (ATARAX/VISTARIL) 25 MG tablet Take 1 to 2 tablets po QHS  prn insomnia/anxiety  . MAGNESIUM PO Take by mouth.  . Multiple Vitamin (MULTIVITAMIN WITH MINERALS) TABS tablet Take 1 tablet by mouth daily.  . ondansetron (ZOFRAN) 4 MG tablet Take 1 tablet (4 mg total) by mouth every 8 (eight) hours as needed for up to 10 doses for nausea or vomiting.  . Oreg-Peppermint-Thyme-Gldnseal (YEAST FORMULA PO) Take by mouth. Yeast cleanse  6 pills a day  . rOPINIRole (REQUIP) 1 MG tablet Take 1 tablet (1 mg total) by mouth at bedtime.  . triamcinolone (KENALOG) 0.025 % cream Apply 1 application topically 2 (two) times daily.  . cyclobenzaprine (FLEXERIL) 5 MG tablet Take 2 tablets (10 mg total) by mouth 2 (two) times daily as needed for muscle spasms.   No facility-administered encounter medications on file as of 04/30/2019.    Surgical History: Past Surgical History:  Procedure Laterality Date  . ANKLE FRACTURE SURGERY Right   . COLONOSCOPY WITH PROPOFOL N/A 12/16/2016   Procedure: COLONOSCOPY WITH PROPOFOL;  Surgeon: Lollie Sails, MD;  Location: [facility] ENDOSCOPY;  Service: Endoscopy;  Laterality: N/A;  . COLONOSCOPY WITH PROPOFOL N/A 03/14/2017   Procedure: COLONOSCOPY WITH PROPOFOL;  Surgeon: Lollie Sails, MD;  Location: [facility] ENDOSCOPY;  Service: Endoscopy;  Laterality: N/A;  . HEMORRHOID SURGERY    . LITHOTRIPSY    . MYRINGOTOMY WITH TUBE PLACEMENT Left 04/28/2019   Procedure: MYRINGOTOMY WITH TUBE PLACEMENT;  Surgeon: Carloyn Manner, MD;  Location: [place];  Service: ENT;  Laterality: Left;  . NASOPHARYNGOSCOPY N/A 04/28/2019   Procedure: NASOPHARYNGOSCOPY WITH BIOPSY;  Surgeon: Carloyn Manner, MD;  Location: [place];  Service: ENT;  Laterality: N/A;  . TONSILLECTOMY      Medical History: Past Medical History:  Diagnosis Date  . Alcohol abuse   . Benzodiazepine dependence ([place])   . Benzodiazepine withdrawal ([place])   . Chronic pain in right foot   . Depression 08/19/2017  . Hepatitis C   . Hypertension   .  Seizures ([place]) 2010  . Sleep apnea   . Syncope    questionable vasovagal    Family History: Family History  Problem Relation Age of Onset  . Arthritis Mother   . Hypertension Mother   . Cancer Father   . Kidney disease Father   . Alcohol abuse Paternal Aunt   . Depression Maternal Grandfather     Social History   Socioeconomic History  . Marital status: Divorced    Spouse name: Not on file  . Number of children: Not on file  . Years of education: Not on file  . Highest education level: Not on file  Occupational History  . Not on file  Tobacco Use  . Smoking status: Former Smoker    Packs/day: 0.50    Types: Cigarettes    Quit date: 05/06/2017    Years since quitting: 1.9  . Smokeless tobacco: Never Used  Substance and Sexual Activity  . Alcohol use: Yes    Alcohol/week: 7.0 standard drinks    Types: 7 Standard drinks or equivalent per week  . Drug use: No  . Sexual activity: Not on file  Other Topics Concern  . Not on file  Social History Narrative  . Not on file   Social Determinants of Health   Financial Resource Strain:   . Difficulty of Paying Living Expenses: Not on file  Food Insecurity:   . Worried About Charity fundraiser in the Last Year: Not on file  . Ran Out of Food in the Last Year: Not on file  Transportation Needs:   . Lack of Transportation (Medical): Not on file  . Lack of Transportation (Non-Medical): Not on file  Physical Activity:   . Days of Exercise per Week: Not on file  . Minutes of Exercise per Session: Not on file  Stress:   . Feeling of Stress : Not on file  Social Connections:   . Frequency of Communication with Friends and Family: Not on file  . Frequency of Social Gatherings with Friends and Family: Not on file  . Attends Religious Services: Not on file  . Active Member of Clubs or Organizations: Not on file  . Attends Archivist Meetings: Not on file  . Marital Status: Not on file  Intimate Partner Violence:    . Fear of Current or Ex-Partner: Not on file  . Emotionally Abused: Not on file  . Physically Abused: Not on file  . Sexually Abused: Not on file      Review of Systems  Constitutional: Negative.  Negative for chills, fatigue and unexpected weight change.  HENT: Negative.  Negative for congestion, rhinorrhea, sneezing and sore throat.   Eyes: Negative for redness.  Respiratory: Negative.  Negative for cough, chest tightness and shortness of breath.   Cardiovascular: Negative.  Negative for chest pain and palpitations.  Gastrointestinal: Negative.  Negative for abdominal pain, constipation, diarrhea, nausea and vomiting.  Endocrine: Negative.   Genitourinary: Negative.  Negative  for dysuria and frequency.  Musculoskeletal: Negative.  Negative for arthralgias, back pain, joint swelling and neck pain.  Skin: Negative.  Negative for rash.  Allergic/Immunologic: Negative.   Neurological: Negative.  Negative for tremors and numbness.  Hematological: Negative for adenopathy. Does not bruise/bleed easily.  Psychiatric/Behavioral: Negative.  Negative for behavioral problems, sleep disturbance and suicidal ideas. The patient is not nervous/anxious.     Vital Signs: There were no vitals taken for this visit.   Observation/Objective:  Well sounding, NAD noted at this time.    Assessment/Plan: 1. Low back pain due to bilateral sciatica Use flexeril as discussed. PRN - cyclobenzaprine (FLEXERIL) 5 MG tablet; Take 2 tablets (10 mg total) by mouth 2 (two) times daily as needed for muscle spasms. (Patient not taking: Reported on 05/10/2019)  Dispense: 30 tablet; Refill: 0  2. Essential hypertension Stable, continue current mgmt.  3. Gastroesophageal reflux disease without esophagitis No recent issues.  Continue therapy.  4. Mixed hyperlipidemia continue to monitor.  General Counseling: [name] understanding of the findings of today's phone visit and agrees with plan of  treatment. I have discussed any further diagnostic evaluation that may be needed or ordered today. We also reviewed his medications today. he has been encouraged to call the office with any questions or concerns that should arise related to todays visit.    No orders of the defined types were placed in this encounter.   Meds ordered this encounter  Medications  . cyclobenzaprine (FLEXERIL) 5 MG tablet    Sig: Take 2 tablets (10 mg total) by mouth 2 (two) times daily as needed for muscle spasms.    Dispense:  30 tablet    Refill:  0    Time spent: Wall AGNP-C Internal medicine

## 2019-05-10 ENCOUNTER — Encounter (INDEPENDENT_AMBULATORY_CARE_PROVIDER_SITE_OTHER): Payer: Self-pay

## 2019-05-10 ENCOUNTER — Inpatient Hospital Stay: Payer: Medicare HMO | Attending: Oncology | Admitting: Oncology

## 2019-05-10 ENCOUNTER — Encounter: Payer: Self-pay | Admitting: Oncology

## 2019-05-10 ENCOUNTER — Inpatient Hospital Stay: Payer: Medicare HMO

## 2019-05-10 ENCOUNTER — Other Ambulatory Visit: Payer: Self-pay

## 2019-05-10 ENCOUNTER — Other Ambulatory Visit: Payer: Self-pay | Admitting: Anatomic Pathology & Clinical Pathology

## 2019-05-10 VITALS — BP 159/90 | HR 101 | Temp 98.4°F | Ht 69.0 in | Wt 219.0 lb

## 2019-05-10 DIAGNOSIS — Z87891 Personal history of nicotine dependence: Secondary | ICD-10-CM | POA: Diagnosis not present

## 2019-05-10 DIAGNOSIS — K219 Gastro-esophageal reflux disease without esophagitis: Secondary | ICD-10-CM

## 2019-05-10 DIAGNOSIS — F329 Major depressive disorder, single episode, unspecified: Secondary | ICD-10-CM

## 2019-05-10 DIAGNOSIS — Z7982 Long term (current) use of aspirin: Secondary | ICD-10-CM

## 2019-05-10 DIAGNOSIS — Z7189 Other specified counseling: Secondary | ICD-10-CM

## 2019-05-10 DIAGNOSIS — Z79899 Other long term (current) drug therapy: Secondary | ICD-10-CM | POA: Diagnosis not present

## 2019-05-10 DIAGNOSIS — Z8249 Family history of ischemic heart disease and other diseases of the circulatory system: Secondary | ICD-10-CM

## 2019-05-10 DIAGNOSIS — M545 Low back pain: Secondary | ICD-10-CM | POA: Diagnosis not present

## 2019-05-10 DIAGNOSIS — E782 Mixed hyperlipidemia: Secondary | ICD-10-CM

## 2019-05-10 DIAGNOSIS — I10 Essential (primary) hypertension: Secondary | ICD-10-CM

## 2019-05-10 DIAGNOSIS — G609 Hereditary and idiopathic neuropathy, unspecified: Secondary | ICD-10-CM | POA: Diagnosis not present

## 2019-05-10 DIAGNOSIS — C119 Malignant neoplasm of nasopharynx, unspecified: Secondary | ICD-10-CM

## 2019-05-10 DIAGNOSIS — H9192 Unspecified hearing loss, left ear: Secondary | ICD-10-CM

## 2019-05-10 DIAGNOSIS — R69 Illness, unspecified: Secondary | ICD-10-CM | POA: Diagnosis not present

## 2019-05-10 LAB — CBC WITH DIFFERENTIAL/PLATELET
Abs Immature Granulocytes: 0.12 10*3/uL — ABNORMAL HIGH (ref 0.00–0.07)
Basophils Absolute: 0 10*3/uL (ref 0.0–0.1)
Basophils Relative: 0 %
Eosinophils Absolute: 0.1 10*3/uL (ref 0.0–0.5)
Eosinophils Relative: 1 %
HCT: 48.4 % (ref 39.0–52.0)
Hemoglobin: 15.9 g/dL (ref 13.0–17.0)
Immature Granulocytes: 1 %
Lymphocytes Relative: 22 %
Lymphs Abs: 2.3 10*3/uL (ref 0.7–4.0)
MCH: 30.1 pg (ref 26.0–34.0)
MCHC: 32.9 g/dL (ref 30.0–36.0)
MCV: 91.7 fL (ref 80.0–100.0)
Monocytes Absolute: 0.7 10*3/uL (ref 0.1–1.0)
Monocytes Relative: 7 %
Neutro Abs: 7.2 10*3/uL (ref 1.7–7.7)
Neutrophils Relative %: 69 %
Platelets: 226 10*3/uL (ref 150–400)
RBC: 5.28 MIL/uL (ref 4.22–5.81)
RDW: 14.1 % (ref 11.5–15.5)
WBC: 10.5 10*3/uL (ref 4.0–10.5)
nRBC: 0 % (ref 0.0–0.2)

## 2019-05-10 LAB — COMPREHENSIVE METABOLIC PANEL
ALT: 29 U/L (ref 0–44)
AST: 29 U/L (ref 15–41)
Albumin: 4.6 g/dL (ref 3.5–5.0)
Alkaline Phosphatase: 71 U/L (ref 38–126)
Anion gap: 9 (ref 5–15)
BUN: 16 mg/dL (ref 8–23)
CO2: 26 mmol/L (ref 22–32)
Calcium: 9.2 mg/dL (ref 8.9–10.3)
Chloride: 104 mmol/L (ref 98–111)
Creatinine, Ser: 1.36 mg/dL — ABNORMAL HIGH (ref 0.61–1.24)
GFR calc Af Amer: >60 mL/min (ref >60)
GFR calc non Af Amer: 54 mL/min — ABNORMAL LOW (ref >60)
Glucose, Bld: 119 mg/dL — ABNORMAL HIGH (ref 70–99)
Potassium: 4 mmol/L (ref 3.5–5.1)
Sodium: 139 mmol/L (ref 135–145)
Total Bilirubin: 0.6 mg/dL (ref 0.3–1.2)
Total Protein: 8.2 g/dL — ABNORMAL HIGH (ref 6.5–8.1)

## 2019-05-10 LAB — FOLATE: Folate: 8.5 ng/mL (ref >5.9)

## 2019-05-10 LAB — SURGICAL PATHOLOGY

## 2019-05-10 LAB — VITAMIN B12: Vitamin B-12: 202 pg/mL (ref 180–914)

## 2019-05-10 NOTE — Progress Notes (Signed)
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

## 2019-05-11 ENCOUNTER — Telehealth (INDEPENDENT_AMBULATORY_CARE_PROVIDER_SITE_OTHER): Payer: Self-pay

## 2019-05-11 ENCOUNTER — Encounter (INDEPENDENT_AMBULATORY_CARE_PROVIDER_SITE_OTHER): Payer: Self-pay

## 2019-05-11 ENCOUNTER — Encounter: Payer: Self-pay | Admitting: Oncology

## 2019-05-11 ENCOUNTER — Telehealth: Payer: Self-pay | Admitting: *Deleted

## 2019-05-11 DIAGNOSIS — C119 Malignant neoplasm of nasopharynx, unspecified: Secondary | ICD-10-CM | POA: Insufficient documentation

## 2019-05-11 DIAGNOSIS — Z7189 Other specified counseling: Secondary | ICD-10-CM | POA: Insufficient documentation

## 2019-05-11 NOTE — Progress Notes (Signed)
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  Pryor Ochoa for symptoms of ear pain and discharge.  Patient also noticed associated hearing loss and tinnitus.  Patient was noted to have erythema and edema in the left sphenopalatine fossa as well as an exophytic mass on NPL exam.  CT soft tissue of the neck showed asymmetric soft tissue within the left nasopharynx measuring 2.4 x 2 x 3.2 cm.  There is lateral bulging with encroachment upon the left parapharyngeal space without frank infiltration.  No extension into oropharynx or nasal cavity.  No skull base infiltration.  No appreciable mass within the larynx.  No pathologically enlarged cervical lymph nodes.  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  Patient referred for further management.  He lives alone and is independent of his ADLs and IADLs.  He does have some baseline neuropathy especially in his right foot from prior trauma but also reports some tingling numbness that is mild in his bilateral fingers.  His sister is his primary caregiver and close relative.  Currently patient reports ongoing diminished hearing in his left ear.  Reports that the discharge from his hearing has gone down.  Appetite and weight have remained stable.  Denies any difficulty swallowing  ECOG PS- 1  Pain scale- 0   Review of systems- ROS  Allergies  Allergen  Reactions  . Opana [Oxymorphone Hcl]     Made him BLACKOUT  . Amoxicillin Other (See Comments)  . Oxymorphone Other (See Comments)  . Sulfa Antibiotics Other (See Comments)  . Sulfur     Childhood reaction     Patient Active Problem List   Diagnosis Date Noted  . Non-recurrent acute serous otitis media of left ear 03/22/2019  . Cellulitis and abscess of head 03/02/2019  . Atopic dermatitis 02/14/2019  . Vitamin B12 deficiency 10/24/2018  . Stomatitis and mucositis 10/24/2018  . Low back pain due to bilateral sciatica 07/20/2018  . Restless leg syndrome 07/20/2018  . Mixed hyperlipidemia 03/03/2018  . Thrush, oral 12/17/2017  . Depression 08/19/2017  . History of hepatitis C 08/19/2017  . Syncope 08/19/2017  . Gastroesophageal reflux disease without esophagitis 08/19/2017  . Encounter for general adult medical examination with abnormal findings 08/06/2017  . Primary insomnia 08/06/2017  . 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 Screening for colon cancer 08/06/2017  . Dysuria 08/06/2017  . Hereditary and idiopathic peripheral neuropathy 01/05/2015  . Complex regional pain syndrome of both lower extremities 01/05/2015  . Neuralgia 01/05/2015  . Seizures ([place]) 11/29/2013  . Benzodiazepine withdrawal ([place]) 11/29/2013  . Seizure disorder ([place]) 11/29/2013  . Alcohol abuse 11/29/2013  . Essential hypertension 11/29/2013  . Hepatitis C 11/29/2013  . Abnormal chest x-ray 11/29/2013  . Obesity 11/29/2013  . Tobacco use disorder 11/29/2013     Past Medical History:  Diagnosis Date  . Alcohol abuse   . Benzodiazepine dependence ([place])   . Benzodiazepine withdrawal ([place])   . Chronic pain  in right foot   . Depression 08/19/2017  . Hepatitis C   . Hypertension   . Seizures ([place]) 2010  . Sleep apnea   . Syncope    questionable vasovagal     Past Surgical History:  Procedure Laterality Date  . ANKLE FRACTURE SURGERY Right   . COLONOSCOPY WITH PROPOFOL N/A 12/16/2016    Procedure: COLONOSCOPY WITH PROPOFOL;  Surgeon: Lollie Sails, MD;  Location[facility] ENDOSCOPY;  Service: Endoscopy;  Laterality: N/A;  . COLONOSCOPY WITH PROPOFOL N/A 03/14/2017   Procedure: COLONOSCOPY WITH PROPOFOL;  Surgeon: Lollie Sails, MD;  Location: [facility] ENDOSCOPY;  Service: Endoscopy;  Laterality: N/A;  . HEMORRHOID SURGERY    . LITHOTRIPSY    . MYRINGOTOMY WITH TUBE PLACEMENT Left 04/28/2019   Procedure: MYRINGOTOMY WITH TUBE PLACEMENT;  Surgeon: Carloyn Manner, MD;  Location: [place];  Service: ENT;  Laterality: Left;  . NASOPHARYNGOSCOPY N/A 04/28/2019   Procedure: NASOPHARYNGOSCOPY WITH BIOPSY;  Surgeon: Carloyn Manner, MD;  Location: [place];  Service: ENT;  Laterality: N/A;  . TONSILLECTOMY      Social History   Socioeconomic History  . Marital status: Divorced    Spouse name: Not on file  . Number of children: Not on file  . Years of education: Not on file  . Highest education level: Not on file  Occupational History  . Not on file  Tobacco Use  . Smoking status: Former Smoker    Packs/day: 0.50    Years: 50.00    Pack years: 25.00    Types: Cigarettes    Quit date: 02/05/2019    Years since quitting: 0.2  . Smokeless tobacco: Never Used  Substance and Sexual Activity  . Alcohol use: Yes    Alcohol/week: 7.0 standard drinks    Types: 7 Standard drinks or equivalent per week    Comment: rarely  . Drug use: No  . Sexual activity: Not on file  Other Topics Concern  . Not on file  Social History Narrative  . Not on file   Social Determinants of Health   Financial Resource Strain:   . Difficulty of Paying Living Expenses: Not on file  Food Insecurity:   . Worried About Charity fundraiser in the Last Year: Not on file  . Ran Out of Food in the Last Year: Not on file  Transportation Needs:   . Lack of Transportation (Medical): Not on file  . Lack of Transportation (Non-Medical): Not on file  Physical Activity:   .  Days of Exercise per Week: Not on file  . Minutes of Exercise per Session: Not on file  Stress:   . Feeling of Stress : Not on file  Social Connections:   . Frequency of Communication with Friends and Family: Not on file  . Frequency of Social Gatherings with Friends and Family: Not on file  . Attends Religious Services: Not on file  . Active Member of Clubs or Organizations: Not on file  . Attends Archivist Meetings: Not on file  . Marital Status: Not on file  Intimate Partner Violence:   . Fear of Current or Ex-Partner: Not on file  . Emotionally Abused: Not on file  . Physically Abused: Not on file  . Sexually Abused: Not on file     Family History  Problem Relation Age of Onset  . Arthritis Mother   . Hypertension Mother   . Cancer Father   . Kidney disease Father   .  Alcohol abuse Paternal Aunt   . Depression Maternal Grandfather      Current Outpatient Medications:  .  amLODipine (NORVASC) 5 MG tablet, Take 1 tablet (5 mg total) by mouth daily., Disp: 90 tablet, Rfl: 3 .  antiseptic oral rinse (BIOTENE) LIQD, 15 mLs by Mouth Rinse route as needed for dry mouth., Disp: , Rfl:  .  aspirin EC 81 MG tablet, Take 81 mg by mouth daily., Disp: , Rfl:  .  Crisaborole (EUCRISA) 2 % OINT, Apply 1 application topically 2 (two) times daily as needed., Disp: 100 g, Rfl: 0 .  gabapentin (NEURONTIN) 100 MG capsule, Take 1 capsule (100 mg total) by mouth 3 (three) times daily., Disp: 90 capsule, Rfl: 3 .  hydrOXYzine (ATARAX/VISTARIL) 25 MG tablet, Take 1 to 2 tablets po QHS prn insomnia/anxiety, Disp: 60 tablet, Rfl: 2 .  cyclobenzaprine (FLEXERIL) 5 MG tablet, Take 2 tablets (10 mg total) by mouth 2 (two) times daily as needed for muscle spasms. (Patient not taking: Reported on 05/10/2019), Disp: 30 tablet, Rfl: 0 .  MAGNESIUM PO, Take by mouth., Disp: , Rfl:  .  Multiple Vitamin (MULTIVITAMIN WITH MINERALS) TABS tablet, Take 1 tablet by mouth daily., Disp: , Rfl:  .   ondansetron (ZOFRAN) 4 MG tablet, Take 1 tablet (4 mg total) by mouth every 8 (eight) hours as needed for up to 10 doses for nausea or vomiting. (Patient not taking: Reported on 05/10/2019), Disp: 20 tablet, Rfl: 0 .  Oreg-Peppermint-Thyme-Gldnseal (YEAST FORMULA PO), Take by mouth. Yeast cleanse  6 pills a day, Disp: , Rfl:  .  rOPINIRole (REQUIP) 1 MG tablet, Take 1 tablet (1 mg total) by mouth at bedtime. (Patient not taking: Reported on 05/10/2019), Disp: 30 tablet, Rfl: 3 .  triamcinolone (KENALOG) 0.025 % cream, Apply 1 application topically 2 (two) times daily. (Patient not taking: Reported on 05/10/2019), Disp: 80 g, Rfl: 2   Physical exam:  Vitals:   05/10/19 1457  BP: (!) 159/90  Pulse: (!) 101  Temp: 98.4 F (36.9 C)  TempSrc: Tympanic  Weight: 219 lb (99.3 kg)  Height: 5\' 9"  (1.753 m)   Physical Exam     CMP Latest Ref Rng & Units 05/10/2019  Glucose 70 - 99 mg/dL 119(H)  BUN 8 - 23 mg/dL 16  Creatinine 0.61 - 1.24 mg/dL 1.36(H)  Sodium 135 - 145 mmol/L 139  Potassium 3.5 - 5.1 mmol/L 4.0  Chloride 98 - 111 mmol/L 104  CO2 22 - 32 mmol/L 26  Calcium 8.9 - 10.3 mg/dL 9.2  Total Protein 6.5 - 8.1 g/dL 8.2(H)  Total Bilirubin 0.3 - 1.2 mg/dL 0.6  Alkaline Phos 38 - 126 U/L 71  AST 15 - 41 U/L 29  ALT 0 - 44 U/L 29   CBC Latest Ref Rng & Units 05/10/2019  WBC 4.0 - 10.5 K/uL 10.5  Hemoglobin 13.0 - 17.0 g/dL 15.9  Hematocrit 39.0 - 52.0 % 48.4  Platelets 150 - 400 K/uL 226    No images are attached to the encounter.  CT SOFT TISSUE NECK W CONTRAST  Result Date: 04/21/2019 CLINICAL DATA:  Nasopharyngeal mass. Additional history provided: Patient reports left ear hearing loss for 4 weeks. EXAM: CT NECK WITH CONTRAST TECHNIQUE: Multidetector CT imaging of the neck was performed using the standard protocol following the bolus administration of intravenous contrast. CONTRAST:  52mL OMNIPAQUE IOHEXOL 300 MG/ML  SOLN COMPARISON:  Head CT 04/07/2017, report from neck CT  11/10/2000 (images unavailable) FINDINGS: Pharynx and  larynx: There is asymmetric soft tissue within the left nasopharynx measuring 2.4 x 2.0 x 3.2 cm (series 2, image 29) (series 6, image 59). Findings consistent with the provided history of nasopharyngeal mass. There is lateral bulging with encroachment upon the left parapharyngeal space without frank infiltration. No extension into the oropharynx or nasal cavity. No skull base infiltration. No appreciable mass or swelling within the larynx. Salivary glands: No evidence of inflammation, mass or stone. Thyroid: Negative. Lymph nodes: No pathologically enlarged cervical chain lymph nodes Vascular: The major vascular structures of the neck appear patent. Atherosclerotic disease at the carotid bifurcations and within the proximal internal carotid arteries bilaterally. Limited intracranial: No abnormality identified. Visualized orbits: Visualized orbits demonstrate no acute abnormality. Mastoids and visualized paranasal sinuses: Moderate mucosal thickening within the bilateral frontal sinuses with partial opacification of the bilateral frontal sinus drainage pathways. Moderate mucosal thickening within bilateral anterior ethmoid air cells. Mild mucosal thickening also present within the inferior left maxillary sinus. Left mastoid effusion. Skeleton: No acute bony abnormality. Cervical spondylosis greatest at C5-C6 and C6-C7 without high-grade bony spinal canal narrowing. Upper chest: No consolidation within the imaged lung apices. IMPRESSION: Left nasopharyngeal soft tissue mass measuring 2.4 x 2.0 x 3.2 cm. Findings are favored to reflect nasopharyngeal carcinoma. There is lateral bulging with encroachment upon the left parapharyngeal space without frank infiltration. No extension into the oropharynx or nasal cavity. No skull base infiltration. No cervical lymphadenopathy. Left mastoid effusion. Paranasal sinus disease as described. Electronically Signed   By: Kellie Simmering DO   On: 04/21/2019 11:10    Assessment and plan- Patient is a 68 y.o. 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  Patient noted to have a 3.2 cm nasopharyngeal mass with extension into the left parapharyngeal space without frank infiltration.  No involvement of the oropharynx or skull base.  This would be consistent with a T2 lesion.  At this time I recommend the following:  1.  PET CT scan to complete his staging work-up  2.  MRI base of the skull to clavicle to get a better characterization of the nasopharyngeal mass and the potential help with radiation planning  3. If Patient does not have evidence of distant metastatic disease, per NCCN guidelines I would recommend concurrent chemoradiation with weekly cisplatin at 40 mg per metered squared.  Discussed risks and benefits of cisplatin including all but not limited to nausea, vomiting, low blood counts, risk of infection and hospitalization.  Risk of further hearing loss, peripheral neuropathy and kidney injury associated with cisplatin.  Treatment will be given with a curative intent  4.  Patient will be seeing radiation oncology shortly.  I will refer him to vascular surgery for port placement.  He will also need chemo teach.  I will obtain a baseline audiogram as well.  5.  Patient does have some baseline peripheral neuropathy which I will watch closely with cisplatin treatment.  I will also check his baseline B12 and folate levels today.  6.  Patient has HPV positive/EBV negative disease which overall portends poor prognosis as compared to EBV positive.  Will check peripheral blood EBV DNA PCR as well.  Therefore, upon completion of chemoradiation, I will also consider giving him 3 cycles of adjuvant chemotherapy with cisplatin and 5-FU every 3 weeks for 3 cycles based on  tolerance.  I am avoiding induction chemotherapy at this time to allow for better marrow reserve and to avoid potential worsening of his neuropathy prior to starting concurrent chemoradiation  I will tentatively see him back in 2 weeks time with CBC with differential, CMP for start of first cycle of cisplatin depending on when he starts radiation treatment.  Patient had multiple insightful questions which were answered to his satisfaction   Thank you for this kind referral and the opportunity to participate in the care of this patient   Visit Diagnosis 1. Nasopharyngeal carcinoma ([place])   2. Goals of care, counseling/discussion     Dr. Randa Evens, MD, MPH [facility] [id number] 05/11/2019 1:36 PM

## 2019-05-11 NOTE — Telephone Encounter (Signed)
Called pt to let him know that pt need to increase water intake and then any other fluids to help with creat to get better. Also his b12 is low and she wants him to start 100 mcg b12 and take it once a day every day. He also states he spoke to vascular today and will get port placed 1/7 at 9 am

## 2019-05-11 NOTE — Telephone Encounter (Signed)
Spoke with the patient and he is now scheduled with Dr .Lucky Cowboy for a port placement on 05/20/19 with a 9:00 am arrival time to the [facility]. Patient will do covid testing on 05/18/19 between 12:30-2:30 pm at the [facility]. Pre-procedure instructions were discussed and will be mailed to the patient.

## 2019-05-11 NOTE — Telephone Encounter (Signed)
-----   Message from Sindy Guadeloupe, MD sent at 05/11/2019  8:13 AM EST ----- Please ask him to up his fluid intake given creatinine 1.3. also take po b12 1000 mcg daily

## 2019-05-12 LAB — EPSTEIN BARR VRS(EBV DNA BY PCR)
EBV DNA QN by PCR: NEGATIVE copies/mL
log10 EBV DNA Qn PCR: UNDETERMINED log10 copy/mL

## 2019-05-14 DIAGNOSIS — C801 Malignant (primary) neoplasm, unspecified: Secondary | ICD-10-CM

## 2019-05-14 HISTORY — DX: Malignant (primary) neoplasm, unspecified: C80.1

## 2019-05-17 ENCOUNTER — Ambulatory Visit
Admission: RE | Admit: 2019-05-17 | Discharge: 2019-05-17 | Disposition: A | Discharge status: Home / Self Care | Payer: Medicare HMO | Source: Ambulatory Visit | Attending: Radiation Oncology | Admitting: Radiation Oncology

## 2019-05-17 ENCOUNTER — Other Ambulatory Visit: Payer: Self-pay

## 2019-05-17 VITALS — BP 173/69 | HR 82 | Temp 97.5°F | Resp 18 | Wt 219.3 lb

## 2019-05-17 DIAGNOSIS — H9202 Otalgia, left ear: Secondary | ICD-10-CM | POA: Insufficient documentation

## 2019-05-17 DIAGNOSIS — F329 Major depressive disorder, single episode, unspecified: Secondary | ICD-10-CM | POA: Diagnosis not present

## 2019-05-17 DIAGNOSIS — B1920 Unspecified viral hepatitis C without hepatic coma: Secondary | ICD-10-CM | POA: Diagnosis not present

## 2019-05-17 DIAGNOSIS — Z79899 Other long term (current) drug therapy: Secondary | ICD-10-CM | POA: Insufficient documentation

## 2019-05-17 DIAGNOSIS — Z7982 Long term (current) use of aspirin: Secondary | ICD-10-CM | POA: Diagnosis not present

## 2019-05-17 DIAGNOSIS — F1010 Alcohol abuse, uncomplicated: Secondary | ICD-10-CM | POA: Insufficient documentation

## 2019-05-17 DIAGNOSIS — I10 Essential (primary) hypertension: Secondary | ICD-10-CM | POA: Insufficient documentation

## 2019-05-17 DIAGNOSIS — C119 Malignant neoplasm of nasopharynx, unspecified: Secondary | ICD-10-CM

## 2019-05-17 DIAGNOSIS — R69 Illness, unspecified: Secondary | ICD-10-CM | POA: Diagnosis not present

## 2019-05-17 DIAGNOSIS — G4730 Sleep apnea, unspecified: Secondary | ICD-10-CM | POA: Diagnosis not present

## 2019-05-17 NOTE — Patient Instructions (Signed)

## 2019-05-17 NOTE — Consult Note (Signed)
NEW PATIENT EVALUATION  Name: [name]  MRN: [id number]  Date:   05/17/2019     DOB: [birth year]   This 69 y.o. 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  REFERRING PHYSICIAN: Lavera Guise, MD  CHIEF COMPLAINT:  Chief Complaint  Patient presents with  . 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   PREVIOUS INVESTIGATIONS:  CT scan reviewed PET CT scan ordered Pathology report reviewed Clinical notes reviewed  HPI: Patient is a 69 year old male who presented with left ear pain and discharge.  This was associated with some hearing loss and tinnitus.  On examination by ENT was found to have a proximately 2.4 x 3.2 cm exophytic mass in the nasopharynx.  There was encroachment on the left parapharyngeal space.  Neck was clinically negative.  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  CT scan confirmed a 2.4 x 3.2 cm mass in the nasopharynx with no evidence of cervical adenopathy.  PET CT scan has been ordered for next week.  P16 positivity is not present of evidence of HPV associated carcinoma which is reported to be uncommon at this site.  Patient been seen by medical oncology.  He is seen today for radiation oncology opinion.  He is edentulous is recently had teeth made.  He has been on a relatively soft food diet he specifically denies head or neck pain or dysphagia at this time.  PLANNED TREATMENT REGIMEN: Concurrent chemoradiation  PAST MEDICAL HISTORY:  has a past medical history of Alcohol abuse, Benzodiazepine dependence ([place]), Benzodiazepine withdrawal ([place]), Chronic pain in right foot, Depression (08/19/2017), Hepatitis C, Hypertension, Seizures ([place]) (2010), Sleep apnea, and Syncope.    PAST SURGICAL HISTORY:  Past Surgical History:  Procedure Laterality Date  . ANKLE  FRACTURE SURGERY Right   . COLONOSCOPY WITH PROPOFOL N/A 12/16/2016   Procedure: COLONOSCOPY WITH PROPOFOL;  Surgeon: Lollie Sails, MD;  Location: [facility] ENDOSCOPY;  Service: Endoscopy;  Laterality: N/A;  . COLONOSCOPY WITH PROPOFOL N/A 03/14/2017   Procedure: COLONOSCOPY WITH PROPOFOL;  Surgeon: Lollie Sails, MD;  Location: [facility] ENDOSCOPY;  Service: Endoscopy;  Laterality: N/A;  . HEMORRHOID SURGERY    . LITHOTRIPSY    . MYRINGOTOMY WITH TUBE PLACEMENT Left 04/28/2019   Procedure: MYRINGOTOMY WITH TUBE PLACEMENT;  Surgeon: Carloyn Manner, MD;  Location: [facility];  Service: ENT;  Laterality: Left;  . NASOPHARYNGOSCOPY N/A 04/28/2019   Procedure: NASOPHARYNGOSCOPY WITH BIOPSY;  Surgeon: Carloyn Manner, MD;  Location: [facility];  Service: ENT;  Laterality: N/A;  . TONSILLECTOMY      FAMILY HISTORY: family history includes Alcohol abuse in his paternal aunt; Arthritis in his mother; Cancer in his father; Depression in his maternal grandfather; Hypertension in his mother; Kidney disease in his father.  SOCIAL HISTORY:  reports that he quit smoking about 3 months ago. His smoking use included cigarettes. He has a 25.00 pack-year smoking history. He has never used smokeless tobacco. He reports current alcohol use of about 7.0 standard drinks of alcohol per week. He reports that he does not use drugs.  ALLERGIES: Opana [oxymorphone hcl], Amoxicillin, Oxymorphone, Sulfa antibiotics, and Sulfur  MEDICATIONS:  Current Outpatient Medications  Medication Sig Dispense Refill  . amLODipine (NORVASC) 5 MG tablet Take 1 tablet (5 mg total) by mouth daily. 90 tablet 3  .  antiseptic oral rinse (BIOTENE) LIQD 15 mLs by Mouth Rinse route as needed for dry mouth.    Marland Kitchen aspirin EC 81 MG tablet Take 81 mg by mouth daily.    Stasia Cavalier (EUCRISA) 2 % OINT Apply 1 application topically 2 (two) times daily as needed. 100 g 0  . gabapentin (NEURONTIN) 100 MG capsule Take 1 capsule  (100 mg total) by mouth 3 (three) times daily. 90 capsule 3  . hydrOXYzine (ATARAX/VISTARIL) 25 MG tablet Take 1 to 2 tablets po QHS prn insomnia/anxiety 60 tablet 2  . MAGNESIUM PO Take by mouth.    . Multiple Vitamin (MULTIVITAMIN WITH MINERALS) TABS tablet Take 1 tablet by mouth daily.    . Oreg-Peppermint-Thyme-Gldnseal (YEAST FORMULA PO) Take by mouth. Yeast cleanse  6 pills a day    . cyclobenzaprine (FLEXERIL) 5 MG tablet Take 2 tablets (10 mg total) by mouth 2 (two) times daily as needed for muscle spasms. (Patient not taking: Reported on 05/10/2019) 30 tablet 0  . ondansetron (ZOFRAN) 4 MG tablet Take 1 tablet (4 mg total) by mouth every 8 (eight) hours as needed for up to 10 doses for nausea or vomiting. (Patient not taking: Reported on 05/10/2019) 20 tablet 0  . rOPINIRole (REQUIP) 1 MG tablet Take 1 tablet (1 mg total) by mouth at bedtime. (Patient not taking: Reported on 05/10/2019) 30 tablet 3  . triamcinolone (KENALOG) 0.025 % cream Apply 1 application topically 2 (two) times daily. (Patient not taking: Reported on 05/10/2019) 80 g 2   No current facility-administered medications for this encounter.    ECOG PERFORMANCE STATUS:  1 - Symptomatic but completely ambulatory  REVIEW OF SYSTEMS: Patient denies any weight loss, fatigue, weakness, fever, chills or night sweats. Patient denies any loss of vision, blurred vision. Patient denies any ringing  of the ears or hearing loss. No irregular heartbeat. Patient denies heart murmur or history of fainting. Patient denies any chest pain or pain radiating to her upper extremities. Patient denies any shortness of breath, difficulty breathing at night, cough or hemoptysis. Patient denies any swelling in the lower legs. Patient denies any nausea vomiting, vomiting of blood, or coffee ground material in the vomitus. Patient denies any stomach pain. Patient states has had normal bowel movements no significant constipation or diarrhea. Patient denies  any dysuria, hematuria or significant nocturia. Patient denies any problems walking, swelling in the joints or loss of balance. Patient denies any skin changes, loss of hair or loss of weight. Patient denies any excessive worrying or anxiety or significant depression. Patient denies any problems with insomnia. Patient denies excessive thirst, polyuria, polydipsia. Patient denies any swollen glands, patient denies easy bruising or easy bleeding. Patient denies any recent infections, allergies or URI. Patient "s visual fields have not changed significantly in recent time.   PHYSICAL EXAM: BP (!) 173/69 (BP Location: Left Arm, Patient Position: Sitting)   Pulse 82   Temp (!) 97.5 F (36.4 C)   Resp 18   Wt 219 lb 4.8 oz (99.5 kg)   BMI 32.38 kg/m  No evidence of cervical or supraclavicular adenopathy is noted.  Well-developed well-nourished patient in NAD. HEENT reveals PERLA, EOMI, discs not visualized.  Oral cavity is clear. No oral mucosal lesions are identified. Neck is clear without evidence of cervical or supraclavicular adenopathy. Lungs are clear to A&P. Cardiac examination is essentially unremarkable with regular rate and rhythm without murmur rub or thrill. Abdomen is benign with no organomegaly or masses noted. Motor sensory  and DTR levels are equal and symmetric in the upper and lower extremities. Cranial nerves II through XII are grossly intact. Proprioception is intact. No peripheral adenopathy or edema is identified. No motor or sensory levels are noted. Crude visual fields are within normal range.  LABORATORY DATA: Pathology report reviewed    RADIOLOGY RESULTS: CT scan reviewed PET CT scan is pending   IMPRESSION: Probable localized squamous cell carcinoma the nasopharynx in a 69 year old male as yet not completely staged pending PET CT scan  PLAN: At this time if this is confined to the head and neck region would recommend concurrent chemoradiation.  We will plan on delivering  7000 cGy to the area of hypermetabolic activity in the nasopharynx and any positive neck nodes.  We will treat the rest of the neck to 5400 cGy bilaterally using IMRT treatment planning and delivery.  Risks and benefits of treatment occluding possible dryness of the mouth fatigue sore throat dysphagia secondary to radiation esophagitis skin reaction loss of taste or were described in detail to the patient.  He seems to comprehend my treatment plan well.  I personally set up and ordered CT simulation for shortly after his PET CT scan next week.  Patient comprehends my recommendations well.  I would like to take this opportunity to thank you for allowing me to participate in the care of your patient.Noreene Filbert, MD

## 2019-05-18 ENCOUNTER — Inpatient Hospital Stay: Payer: Medicare HMO | Attending: Oncology

## 2019-05-18 ENCOUNTER — Other Ambulatory Visit: Payer: Self-pay

## 2019-05-18 ENCOUNTER — Other Ambulatory Visit
Admission: RE | Admit: 2019-05-18 | Discharge: 2019-05-18 | Disposition: A | Discharge status: Home / Self Care | Payer: Medicare HMO | Source: Ambulatory Visit | Attending: Vascular Surgery | Admitting: Vascular Surgery

## 2019-05-18 DIAGNOSIS — Z87891 Personal history of nicotine dependence: Secondary | ICD-10-CM | POA: Insufficient documentation

## 2019-05-18 DIAGNOSIS — R1310 Dysphagia, unspecified: Secondary | ICD-10-CM | POA: Insufficient documentation

## 2019-05-18 DIAGNOSIS — Z7982 Long term (current) use of aspirin: Secondary | ICD-10-CM | POA: Insufficient documentation

## 2019-05-18 DIAGNOSIS — J449 Chronic obstructive pulmonary disease, unspecified: Secondary | ICD-10-CM | POA: Insufficient documentation

## 2019-05-18 DIAGNOSIS — C119 Malignant neoplasm of nasopharynx, unspecified: Secondary | ICD-10-CM | POA: Insufficient documentation

## 2019-05-18 DIAGNOSIS — Z20822 Contact with and (suspected) exposure to covid-19: Secondary | ICD-10-CM | POA: Insufficient documentation

## 2019-05-18 DIAGNOSIS — F329 Major depressive disorder, single episode, unspecified: Secondary | ICD-10-CM | POA: Insufficient documentation

## 2019-05-18 DIAGNOSIS — I10 Essential (primary) hypertension: Secondary | ICD-10-CM | POA: Insufficient documentation

## 2019-05-18 DIAGNOSIS — Z5111 Encounter for antineoplastic chemotherapy: Secondary | ICD-10-CM | POA: Insufficient documentation

## 2019-05-18 DIAGNOSIS — Z79899 Other long term (current) drug therapy: Secondary | ICD-10-CM | POA: Insufficient documentation

## 2019-05-18 DIAGNOSIS — Z01812 Encounter for preprocedural laboratory examination: Secondary | ICD-10-CM | POA: Insufficient documentation

## 2019-05-18 DIAGNOSIS — G4730 Sleep apnea, unspecified: Secondary | ICD-10-CM | POA: Insufficient documentation

## 2019-05-18 DIAGNOSIS — F1010 Alcohol abuse, uncomplicated: Secondary | ICD-10-CM | POA: Insufficient documentation

## 2019-05-18 DIAGNOSIS — Z7952 Long term (current) use of systemic steroids: Secondary | ICD-10-CM | POA: Insufficient documentation

## 2019-05-19 ENCOUNTER — Other Ambulatory Visit (INDEPENDENT_AMBULATORY_CARE_PROVIDER_SITE_OTHER): Payer: Self-pay | Admitting: Nurse Practitioner

## 2019-05-19 DIAGNOSIS — H903 Sensorineural hearing loss, bilateral: Secondary | ICD-10-CM | POA: Diagnosis not present

## 2019-05-19 LAB — SARS CORONAVIRUS 2 (TAT 6-24 HRS): SARS Coronavirus 2: NEGATIVE

## 2019-05-19 MED ORDER — CLINDAMYCIN PHOSPHATE 300 MG/50ML IV SOLN
300.0000 mg | Freq: Once | INTRAVENOUS | Status: AC
  Administered 2019-05-20: 300 mg via INTRAVENOUS

## 2019-05-19 MED ORDER — SODIUM CHLORIDE 0.9 % IV SOLN
Freq: Once | INTRAVENOUS | Status: DC
  Filled 2019-05-19: qty 2

## 2019-05-20 ENCOUNTER — Other Ambulatory Visit: Payer: Self-pay

## 2019-05-20 ENCOUNTER — Ambulatory Visit
Admission: RE | Admit: 2019-05-20 | Discharge: 2019-05-20 | Disposition: A | Discharge status: Home / Self Care | Payer: Medicare HMO | Attending: Vascular Surgery | Admitting: Vascular Surgery

## 2019-05-20 ENCOUNTER — Encounter: Payer: Self-pay | Admitting: Vascular Surgery

## 2019-05-20 ENCOUNTER — Telehealth (INDEPENDENT_AMBULATORY_CARE_PROVIDER_SITE_OTHER): Payer: Self-pay

## 2019-05-20 ENCOUNTER — Inpatient Hospital Stay: Payer: Medicare HMO | Admitting: Nurse Practitioner

## 2019-05-20 ENCOUNTER — Other Ambulatory Visit: Payer: Self-pay | Admitting: Oncology

## 2019-05-20 ENCOUNTER — Encounter
Admission: RE | Disposition: A | Discharge status: Home / Self Care | Payer: Self-pay | Source: Home / Self Care | Attending: Vascular Surgery

## 2019-05-20 DIAGNOSIS — R569 Unspecified convulsions: Secondary | ICD-10-CM | POA: Insufficient documentation

## 2019-05-20 DIAGNOSIS — G2581 Restless legs syndrome: Secondary | ICD-10-CM | POA: Diagnosis not present

## 2019-05-20 DIAGNOSIS — Z79899 Other long term (current) drug therapy: Secondary | ICD-10-CM | POA: Insufficient documentation

## 2019-05-20 DIAGNOSIS — Z7982 Long term (current) use of aspirin: Secondary | ICD-10-CM | POA: Insufficient documentation

## 2019-05-20 DIAGNOSIS — Z87891 Personal history of nicotine dependence: Secondary | ICD-10-CM | POA: Insufficient documentation

## 2019-05-20 DIAGNOSIS — K219 Gastro-esophageal reflux disease without esophagitis: Secondary | ICD-10-CM | POA: Diagnosis not present

## 2019-05-20 DIAGNOSIS — F329 Major depressive disorder, single episode, unspecified: Secondary | ICD-10-CM | POA: Diagnosis not present

## 2019-05-20 DIAGNOSIS — C119 Malignant neoplasm of nasopharynx, unspecified: Secondary | ICD-10-CM | POA: Insufficient documentation

## 2019-05-20 DIAGNOSIS — E782 Mixed hyperlipidemia: Secondary | ICD-10-CM | POA: Diagnosis not present

## 2019-05-20 DIAGNOSIS — G629 Polyneuropathy, unspecified: Secondary | ICD-10-CM | POA: Diagnosis not present

## 2019-05-20 DIAGNOSIS — R69 Illness, unspecified: Secondary | ICD-10-CM | POA: Diagnosis not present

## 2019-05-20 DIAGNOSIS — I10 Essential (primary) hypertension: Secondary | ICD-10-CM | POA: Insufficient documentation

## 2019-05-20 DIAGNOSIS — D490 Neoplasm of unspecified behavior of digestive system: Secondary | ICD-10-CM

## 2019-05-20 HISTORY — PX: PORTA CATH INSERTION: CATH118285

## 2019-05-20 SURGERY — PORTA CATH INSERTION
Anesthesia: Moderate Sedation

## 2019-05-20 MED ORDER — METHYLPREDNISOLONE SODIUM SUCC 125 MG IJ SOLR: 125.0000 mg | Freq: Once | INTRAMUSCULAR | Status: DC | PRN

## 2019-05-20 MED ORDER — FENTANYL CITRATE (PF) 100 MCG/2ML IJ SOLN
INTRAMUSCULAR | Status: AC
  Filled 2019-05-20: qty 2

## 2019-05-20 MED ORDER — FAMOTIDINE 20 MG PO TABS: 40.0000 mg | ORAL_TABLET | Freq: Once | ORAL | Status: DC | PRN

## 2019-05-20 MED ORDER — MIDAZOLAM HCL 2 MG/2ML IJ SOLN
INTRAMUSCULAR | Status: DC | PRN
  Administered 2019-05-20 (×2): 2 mg via INTRAVENOUS

## 2019-05-20 MED ORDER — ONDANSETRON HCL 4 MG/2ML IJ SOLN: 4.0000 mg | Freq: Four times a day (QID) | INTRAMUSCULAR | Status: DC | PRN

## 2019-05-20 MED ORDER — HYDROMORPHONE HCL 1 MG/ML IJ SOLN: 1.0000 mg | Freq: Once | INTRAMUSCULAR | Status: DC | PRN

## 2019-05-20 MED ORDER — SODIUM CHLORIDE 0.9 % IV SOLN: INTRAVENOUS | Status: DC

## 2019-05-20 MED ORDER — MIDAZOLAM HCL 2 MG/ML PO SYRP: 8.0000 mg | ORAL_SOLUTION | Freq: Once | ORAL | Status: DC | PRN

## 2019-05-20 MED ORDER — CLINDAMYCIN PHOSPHATE 300 MG/50ML IV SOLN
INTRAVENOUS | Status: AC
  Filled 2019-05-20: qty 50

## 2019-05-20 MED ORDER — FENTANYL CITRATE (PF) 100 MCG/2ML IJ SOLN
INTRAMUSCULAR | Status: DC | PRN
  Administered 2019-05-20 (×2): 50 ug via INTRAVENOUS

## 2019-05-20 MED ORDER — DIPHENHYDRAMINE HCL 50 MG/ML IJ SOLN: 50.0000 mg | Freq: Once | INTRAMUSCULAR | Status: DC | PRN

## 2019-05-20 MED ORDER — MIDAZOLAM HCL 2 MG/2ML IJ SOLN
INTRAMUSCULAR | Status: AC
  Filled 2019-05-20: qty 4

## 2019-05-20 SURGICAL SUPPLY — 12 items
ADH SKN CLS APL DERMABOND .7 (GAUZE/BANDAGES/DRESSINGS) ×1
DERMABOND ADVANCED (GAUZE/BANDAGES/DRESSINGS) ×1
DERMABOND ADVANCED .7 DNX12 (GAUZE/BANDAGES/DRESSINGS) IMPLANT
ELECT REM PT RETURN 9FT ADLT (ELECTROSURGICAL) ×2
ELECTRODE REM PT RTRN 9FT ADLT (ELECTROSURGICAL) IMPLANT
KIT PORT POWER 8FR ISP CVUE (Port) ×1 IMPLANT
PACK ANGIOGRAPHY (CUSTOM PROCEDURE TRAY) ×2 IMPLANT
PENCIL ELECTRO HAND CTR (MISCELLANEOUS) ×2 IMPLANT
SUT MNCRL AB 4-0 PS2 18 (SUTURE) ×2 IMPLANT
SUT PROLENE 0 CT 1 30 (SUTURE) ×2 IMPLANT
SUT VIC AB 3-0 SH 27 (SUTURE) ×2
SUT VIC AB 3-0 SH 27X BRD (SUTURE) IMPLANT

## 2019-05-20 NOTE — H&P (Signed)
[facility] History & Physical Update  The patient was interviewed and re-examined.  The patient's previous History and Physical has been reviewed and is unchanged.  There is no change in the plan of care. We plan to proceed with the scheduled procedure.  Leotis Pain, MD  05/20/2019, 9:10 AM

## 2019-05-20 NOTE — Discharge Instructions (Signed)
Implanted Port Removal, Care After Refer to this sheet in the next few weeks. These instructions provide you with information about caring for yourself after your procedure. Your health care provider may also give you more specific instructions. Your treatment has been planned according to current medical practices, but problems sometimes occur. Call your health care provider if you have any problems or questions after your procedure. What can I expect after the procedure? After the procedure, it is common to have: Soreness or pain near your incision. Some swelling or bruising near your incision.  Follow these instructions at home: Medicines Take over-the-counter and prescription medicines only as told by your health care provider. If you were prescribed an antibiotic medicine, take it as told by your health care provider. Do not stop taking the antibiotic even if you start to feel better. Bathing You may shower tomorrow.  Incision care You have an incision with sutures that are dissolvable and skin glue over top incision.  This skin glue will wear off over time.  Do not peel or try to remove this yourself. Keep your dressing dry. Check your incision area every day for signs of infection and if present call your doctor and let them know. Check for: More redness, swelling, or pain. More fluid or blood. Warmth. Pus or a bad smell. Driving If you received a sedative, do not drive for 24 hours after the procedure. If you did not receive a sedative, ask your health care provider when it is safe to drive. Activity Return to your normal activities as told by your health care provider. Ask your health care provider what activities are safe for you. Until your health care provider says it is safe: Do not lift anything that is heavier than 10 lb (4.5 kg). Do not do activities that involve lifting your arms over your head. General instructions Do not use any tobacco products, such as cigarettes,  chewing tobacco, and e-cigarettes. Tobacco can delay healing. If you need help quitting, ask your health care provider. Keep all follow-up visits as told by your health care provider. This is important. Contact a health care provider if: You have more redness, swelling, or pain around your incision. You have more fluid or blood coming from your incision. Your incision feels warm to the touch. You have pus or a bad smell coming from your incision. You have a fever. You have pain that is not relieved by your pain medicine. Get help right away if: You have chest pain. You have difficulty breathing. This information is not intended to replace advice given to you by your health care provider. Make sure you discuss any questions you have with your health care provider. Document Released: 04/10/2015 Document Revised: 10/05/2015 Document Reviewed: 02/01/2015 Elsevier Interactive Patient Education  2018 [employer]  

## 2019-05-20 NOTE — Op Note (Signed)
      [facility]       Operative Note  Date: 05/20/2019  Preoperative diagnosis:  1. Nasopharyngeal cancer  Postoperative diagnosis:  Same as above  Procedures: #1. Ultrasound guidance for vascular access to the right internal jugular vein. #2. Fluoroscopic guidance for placement of catheter. #3. Placement of CT compatible Port-A-Cath, right internal jugular vein.  Surgeon: Leotis Pain, MD.   Anesthesia: Local with moderate conscious sedation for approximately 15  minutes using 4 mg of Versed and 100 mcg of Fentanyl  Fluoroscopy time: less than 1 minute  Contrast used: 0  Estimated blood loss: 5 cc  Indication for the procedure:  The patient is a 69 y.o.male with nasopharyngeal cancer.  The patient needs a Port-A-Cath for durable venous access, chemotherapy, lab draws, and CT scans. We are asked to place this. Risks and benefits were discussed and informed consent was obtained.  Description of procedure: The patient was brought to the vascular and interventional radiology suite.  Moderate conscious sedation was administered throughout the procedure during a face to face encounter with the patient with my supervision of the RN administering medicines and monitoring the patient's vital signs, pulse oximetry, telemetry and mental status throughout from the start of the procedure until the patient was taken to the recovery room. The right neck chest and shoulder were sterilely prepped and draped, and a sterile surgical field was created. Ultrasound was used to help visualize a patent right internal jugular vein. This was then accessed under direct ultrasound guidance without difficulty with the Seldinger needle and a permanent image was recorded. A J-wire was placed. After skin nick and dilatation, the peel-away sheath was then placed over the wire. I then anesthetized an area under the clavicle approximately 1-2 fingerbreadths. A transverse incision was created and an  inferior pocket was created with electrocautery and blunt dissection. The port was then brought onto the field, placed into the pocket and secured to the chest wall with 2 Prolene sutures. The catheter was connected to the port and tunneled from the subclavicular incision to the access site. Fluoroscopic guidance was then used to cut the catheter to an appropriate length. The catheter was then placed through the peel-away sheath and the peel-away sheath was removed. The catheter tip was parked in excellent location under fluorocoscopic guidance in the SVC just above the right atrium. The pocket was then irrigated with antibiotic impregnated saline and the wound was closed with a running 3-0 Vicryl and a 4-0 Monocryl. The access incision was closed with a single 4-0 Monocryl. The Huber needle was used to withdraw blood and flush the port with heparinized saline. Dermabond was then placed as a dressing. The patient tolerated the procedure well and was taken to the recovery room in stable condition.   Leotis Pain 05/20/2019 11:03 AM   This note was created with Dragon Medical transcription system. Any errors in dictation are purely unintentional.

## 2019-05-20 NOTE — Telephone Encounter (Signed)
Patient had a porta cath insertion on today and called stating that he is having soreness,redness,bruise, and some pain. I spoke with Eulogio Ditch NP and she advise that this is to be expected and he can alternate tylenol 1000mg  and ibuprofen 600-800mg  every 3-4 hours as needed. Patient has been made aware with medical advice and verbalized understanding.

## 2019-05-24 ENCOUNTER — Other Ambulatory Visit: Payer: Self-pay

## 2019-05-24 ENCOUNTER — Inpatient Hospital Stay (HOSPITAL_BASED_OUTPATIENT_CLINIC_OR_DEPARTMENT_OTHER): Payer: Medicare HMO | Admitting: Nurse Practitioner

## 2019-05-24 DIAGNOSIS — Z79899 Other long term (current) drug therapy: Secondary | ICD-10-CM

## 2019-05-24 DIAGNOSIS — I10 Essential (primary) hypertension: Secondary | ICD-10-CM

## 2019-05-24 DIAGNOSIS — C119 Malignant neoplasm of nasopharynx, unspecified: Secondary | ICD-10-CM | POA: Diagnosis not present

## 2019-05-24 DIAGNOSIS — Z87891 Personal history of nicotine dependence: Secondary | ICD-10-CM | POA: Diagnosis not present

## 2019-05-24 DIAGNOSIS — Z7982 Long term (current) use of aspirin: Secondary | ICD-10-CM

## 2019-05-24 DIAGNOSIS — Z8249 Family history of ischemic heart disease and other diseases of the circulatory system: Secondary | ICD-10-CM | POA: Diagnosis not present

## 2019-05-24 NOTE — Progress Notes (Signed)
Virtual Visit Progress Note  [place] [facility] [place]  [phone] [phone]  Patient Care Team: Lavera Guise, MD as PCP - General (Internal Medicine) Sindy Guadeloupe, MD as Consulting Physician (Hematology and Oncology)   Name of the patient: [name]  PD:[id number]  1950/07/19   Date of visit: 05/24/19  I connected with [name] on 05/24/19 at  1:30 PM EST by telephone visit and verified that I am speaking with the correct person using two identifiers.   I discussed the limitations, risks, security and privacy concerns of performing an evaluation and management service by telemedicine and the availability of in-person appointments. I also discussed with the patient that there may be a patient responsible charge related to this service. The patient expressed understanding and agreed to proceed.   Other persons participating in the visit and their role in the encounter: Magdalene Patricia, Therapist, sports (Nurse Navigator & Chemo Education)  Patient's location: home Provider's location: home  Diagnosis-nasopharyngeal carcinoma  Chief complaint/Reason for visit- Initial Meeting for [facility], preparing for starting chemotherapy  Heme/Onc history:  Patient initially presented a 69 year old male who presented with left ear pain and discharge with associated hearing loss and tinnitus.  On exam by ENT he was found to have a approximately 2.4 x 3.2 cm exophytic mass in the nasopharynx.  There was slight encroachment on the left parapharyngeal space.  Note was medically negative.  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  CT scan confirmed a 2.4 x 3.2 cm mass in the nasopharynx with no evidence of cervical adenopathy.   PET CT scan scheduled for 05/25/2019.  Port was placed on 05/20/2019 by Dr. Lucky Cowboy. He was evaluated by radiation and medical oncology with  recommendation for concurrent chemo-cisplatin and radiation (Plan for 7000 cGy to the area of hypermetabolic activity in the nasopharynx and any positive neck nodes.  Will plan to treat the rest of the neck to 5400 cGy bilaterally using IMRT).   Oncology History   No history exists.    Interval history-  [name], West, 69 year old male recently diagnosed with nasopharyngeal carcinoma, who presents to chemo care clinic today for initial meeting in preparation for starting chemotherapy. I introduced the chemo care clinic and we discussed that the role of the clinic is to assist those who are at an increased risk of emergency room visits and/or complications during the course of chemotherapy treatment. We discussed that the increased risk takes into account factors such as age, performance status, and co-morbidities. We also discussed that for some, this might include barriers to care such as not having a primary care provider, lack of insurance/transportation, or not being able to afford medications. We discussed that the goal of the program is to help prevent unplanned ER visits and help reduce complications during chemotherapy. We do this by discussing specific risk factors to each individual and identifying ways that we can help improve these risk factors and reduce barriers to care.  We specifically reviewed that he was identified as high risk based on previous hospitalizations and ER visits, not identifying as being in a relationship, Medicaid status, Medicare status, history of anemia and CVD, COPD, connective tissue disorder, depression, and liver disease.  Per patient he has a history of hepatitis C which was treated with Harvoni years ago and he has been on surveillance since that time.  He says that  depression is currently well controlled and he is not actively on treatment.  Does not take inhalers and is unaware of COPD diagnosis.  Has a primary care doctor whom he sees regularly.  Says that  he drinks alcohol occasionally.  Has quit smoking  Allergies, current medications, past medical and surgical histories, family, and social histories were reviewed and updated as below.  ECOG FS:1 - Symptomatic but completely ambulatory  Review of systems- Review of Systems  Constitutional: Negative for chills, fever, malaise/fatigue and weight loss.  HENT: Negative for hearing loss, nosebleeds, sore throat and tinnitus.   Eyes: Negative for blurred vision and double vision.  Respiratory: Negative for cough, hemoptysis, shortness of breath and wheezing.   Cardiovascular: Negative for chest pain, palpitations and leg swelling.  Gastrointestinal: Negative for abdominal pain, blood in stool, constipation, diarrhea, melena, nausea and vomiting.  Genitourinary: Negative for dysuria and urgency.  Musculoskeletal: Negative for back pain, falls, joint pain and myalgias.  Skin: Negative for itching and rash.  Neurological: Negative for dizziness, tingling, sensory change, loss of consciousness, weakness and headaches.  Endo/Heme/Allergies: Negative for environmental allergies. Does not bruise/bleed easily.  Psychiatric/Behavioral: Negative for depression. The patient is not nervous/anxious and does not have insomnia.      Allergies  Allergen Reactions  . Opana [Oxymorphone Hcl]     Made him BLACKOUT  . Amoxicillin Other (See Comments)  . Oxymorphone Other (See Comments)  . Sulfa Antibiotics Other (See Comments)  . Sulfur     Childhood reaction     Past Medical History:  Diagnosis Date  . Alcohol abuse   . Benzodiazepine dependence ([place])   . Benzodiazepine withdrawal ([place])   . Chronic pain in right foot   . Depression 08/19/2017  . Hepatitis C   . Hypertension   . Seizures ([place]) 2010  . Sleep apnea   . Syncope    questionable vasovagal    Past Surgical History:  Procedure Laterality Date  . COLONOSCOPY WITH PROPOFOL N/A 12/16/2016   Procedure: COLONOSCOPY WITH PROPOFOL;  Surgeon:  Lollie Sails, MD;  Location: [facility] ENDOSCOPY;  Service: Endoscopy;  Laterality: N/A;  . COLONOSCOPY WITH PROPOFOL N/A 03/14/2017   Procedure: COLONOSCOPY WITH PROPOFOL;  Surgeon: Lollie Sails, MD;  [facility] ENDOSCOPY;  Service: Endoscopy;  Laterality: N/A;  . FOOT SURGERY Right 03/12/2002  . HEMORRHOID SURGERY    . LITHOTRIPSY    . MYRINGOTOMY WITH TUBE PLACEMENT Left 04/28/2019   Procedure: MYRINGOTOMY WITH TUBE PLACEMENT;  Surgeon: Carloyn Manner, MD;  Location: [place];  Service: ENT;  Laterality: Left;  . NASOPHARYNGOSCOPY N/A 04/28/2019   Procedure: NASOPHARYNGOSCOPY WITH BIOPSY;  Surgeon: Carloyn Manner, MD;  Location: [place];  Service: ENT;  Laterality: N/A;  . PORTA CATH INSERTION N/A 05/20/2019   Procedure: PORTA CATH INSERTION;  Surgeon: Algernon Huxley, MD;  Location: [place] CV LAB;  Service: Cardiovascular;  Laterality: N/A;  . TONSILLECTOMY      Social History   Socioeconomic History  . Marital status: Divorced    Spouse name: Not on file  . Number of children: Not on file  . Years of education: Not on file  . Highest education level: Not on file  Occupational History  . Not on file  Tobacco Use  . Smoking status: Former Smoker    Packs/day: 0.50    Years: 50.00    Pack years: 25.00    Types: Cigarettes    Quit date: 02/05/2019    Years  since quitting: 0.2  . Smokeless tobacco: Never Used  Substance and Sexual Activity  . Alcohol use: Yes    Alcohol/week: 7.0 standard drinks    Types: 7 Standard drinks or equivalent per week    Comment: rarely  . Drug use: No  . Sexual activity: Not on file  Other Topics Concern  . Not on file  Social History Narrative  . Not on file   Social Determinants of Health   Financial Resource Strain:   . Difficulty of Paying Living Expenses: Not on file  Food Insecurity:   . Worried About Charity fundraiser in the Last Year: Not on file  . Ran Out of Food in the Last Year: Not  on file  Transportation Needs:   . Lack of Transportation (Medical): Not on file  . Lack of Transportation (Non-Medical): Not on file  Physical Activity:   . Days of Exercise per Week: Not on file  . Minutes of Exercise per Session: Not on file  Stress:   . Feeling of Stress : Not on file  Social Connections:   . Frequency of Communication with Friends and Family: Not on file  . Frequency of Social Gatherings with Friends and Family: Not on file  . Attends Religious Services: Not on file  . Active Member of Clubs or Organizations: Not on file  . Attends Archivist Meetings: Not on file  . Marital Status: Not on file  Intimate Partner Violence:   . Fear of Current or Ex-Partner: Not on file  . Emotionally Abused: Not on file  . Physically Abused: Not on file  . Sexually Abused: Not on file    Family History  Problem Relation Age of Onset  . Arthritis Mother   . Hypertension Mother   . Cancer Father   . Kidney disease Father   . Alcohol abuse Paternal Aunt   . Depression Maternal Grandfather      Current Outpatient Medications:  .  amLODipine (NORVASC) 5 MG tablet, Take 1 tablet (5 mg total) by mouth daily., Disp: 90 tablet, Rfl: 3 .  antiseptic oral rinse (BIOTENE) LIQD, 15 mLs by Mouth Rinse route as needed for dry mouth., Disp: , Rfl:  .  aspirin EC 81 MG tablet, Take 81 mg by mouth daily., Disp: , Rfl:  .  Crisaborole (EUCRISA) 2 % OINT, Apply 1 application topically 2 (two) times daily as needed. (Patient not taking: Reported on 05/20/2019), Disp: 100 g, Rfl: 0 .  cyclobenzaprine (FLEXERIL) 5 MG tablet, Take 2 tablets (10 mg total) by mouth 2 (two) times daily as needed for muscle spasms. (Patient not taking: Reported on 05/10/2019), Disp: 30 tablet, Rfl: 0 .  gabapentin (NEURONTIN) 100 MG capsule, Take 1 capsule (100 mg total) by mouth 3 (three) times daily., Disp: 90 capsule, Rfl: 3 .  hydrOXYzine (ATARAX/VISTARIL) 25 MG tablet, Take 1 to 2 tablets po QHS prn  insomnia/anxiety, Disp: 60 tablet, Rfl: 2 .  MAGNESIUM PO, Take by mouth., Disp: , Rfl:  .  Multiple Vitamin (MULTIVITAMIN WITH MINERALS) TABS tablet, Take 1 tablet by mouth daily., Disp: , Rfl:  .  ondansetron (ZOFRAN) 4 MG tablet, Take 1 tablet (4 mg total) by mouth every 8 (eight) hours as needed for up to 10 doses for nausea or vomiting. (Patient not taking: Reported on 05/10/2019), Disp: 20 tablet, Rfl: 0 .  Oreg-Peppermint-Thyme-Gldnseal (YEAST FORMULA PO), Take by mouth. Yeast cleanse  6 pills a day, Disp: , Rfl:  .  rOPINIRole (REQUIP) 1 MG tablet, Take 1 tablet (1 mg total) by mouth at bedtime. (Patient not taking: Reported on 05/10/2019), Disp: 30 tablet, Rfl: 3 .  triamcinolone (KENALOG) 0.025 % cream, Apply 1 application topically 2 (two) times daily. (Patient not taking: Reported on 05/10/2019), Disp: 80 g, Rfl: 2  Physical exam:  Physical Exam Vitals reviewed: Exam limited due to telemedicine.  Constitutional:      General: He is not in acute distress. Neurological:     Mental Status: He is alert.  Psychiatric:        Mood and Affect: Mood normal.        Thought Content: Thought content normal.      CMP Latest Ref Rng & Units 05/10/2019  Glucose 70 - 99 mg/dL 119(H)  BUN 8 - 23 mg/dL 16  Creatinine 0.61 - 1.24 mg/dL 1.36(H)  Sodium 135 - 145 mmol/L 139  Potassium 3.5 - 5.1 mmol/L 4.0  Chloride 98 - 111 mmol/L 104  CO2 22 - 32 mmol/L 26  Calcium 8.9 - 10.3 mg/dL 9.2  Total Protein 6.5 - 8.1 g/dL 8.2(H)  Total Bilirubin 0.3 - 1.2 mg/dL 0.6  Alkaline Phos 38 - 126 U/L 71  AST 15 - 41 U/L 29  ALT 0 - 44 U/L 29   CBC Latest Ref Rng & Units 05/10/2019  WBC 4.0 - 10.5 K/uL 10.5  Hemoglobin 13.0 - 17.0 g/dL 15.9  Hematocrit 39.0 - 52.0 % 48.4  Platelets 150 - 400 K/uL 226    No images are attached to the encounter.  PERIPHERAL VASCULAR CATHETERIZATION  Result Date: 05/20/2019 See op note    Assessment and plan- Patient is a 69 y.o. male who presents to [facility] for initial meeting in preparation for starting chemotherapy for the treatment of nasopharyngeal carcinoma.    1. Nasopharyngeal carcinoma- Pathology was HPV positive EBV negative.  Awaiting PET for staging.  Currently, plan for concurrent chemotherapy with cisplatin and radiation.  Treatment given with curative intent.    We again reviewed risks and benefits of treatment.  I specifically reviewed measures to help combat dry mouth, sore throat, dysphagia.  We discussed the importance of maintaining his weight and calorie consumption.  I advised that he may need to consume softened or liquids.  Will send referral to dietitian, Jennet Maduro, to follow for weights and trends.  He has a Pharmacist, community.  2. Chemo Care Clinic/High Risk for ER/Hospitalization during chemotherapy- We discussed the role of the chemo care clinic and identified patient specific risk factors. I discussed that patient was identified as high risk primarily based on: Prior hospital admissions and ER visits, Medicaid status, Medicare status, not identifying as being in a relationship, history of anemia, history of CVD, history of COPD, history of connective tissue disorder, history of depression, history of liver disease.   He has a primary care doctor whom he sees regularly.  He identifies a strong social support system despite not identifying as being in a relationship.  He says that he feels well supported through his journey and has has friends with similar cancers and he is optimistic regarding treatment.  No recent history of anemia.  He has a history of hypertension and based on review of previous blood pressure readings not currently well controlled; encouraged compliance with amlodipine which is prescribed by PCP.  Encouraged him to follow back up with his PCP for reevaluation, discussed lifestyle modifications, and encouraged home measurements/blood pressure diary.  He is unaware of  history of COPD and I do not see a formal  diagnosis however, review of imaging from 04/17/2016 showed parabronchial thickening consistent with mild COPD and history significant for smoking; symptomatically, he reports shortness of breath with exertion and I question if clinical COPD.  Encouraged him to follow back up with PCP for evaluation and consideration of PFTs.  Due to COVID-19 pandemic, PFTs may be limited at this point and patient may benefit from trial of medication/maintenance inhalers.  Patient has history of connective tissue disorder which I do not see correlated in his chart and he is unaware of diagnosis.  Can discuss with PCP to clarify.  History of depression which patient says he is not currently requiring medication for and is currently well controlled.  We discussed that exacerbations of depression are not uncommon while receiving treatment or having received a diagnosis of cancer.  We discussed having an open discussion with his PCP has been managing this and consider counseling.  He has a history of hepatitis C and was treated with Harvoni per his account in 2016 and has had no evidence of disease since that time.  Liver numbers have been normal upon review of recent blood work.  3. Social Determinants of Health- we discussed that social determinants of health may have significant impacts on health and outcomes for cancer patients.  Today we discussed specific social determinants of performance status, alcohol use, depression, financial needs, food insecurity, housing, interpersonal violence, social connections, stress, tobacco use, and transportation.  We discussed programs available through the cancer center should he need assistance.    Based on performance status/activity level we discussed options including home based and outpatient services, DME, and CARE program. We discusssed that patients who participate in regular physical activity report fewer negative impacts of cancer and treatments and report less fatigue.   Based on  depression: we discussed self-referral to for counseling services, psychiatry for medication management, or palliative care/symptom management as well as primary care providers.   Based on financial insecurity: We discussed that living with cancer can create tremendous financial burden.  He currently denies significant financial burden and denies needing assistance.  I asked that if this becomes a problem to please let us know so that we could evaluate his need.   He currently denies any food insecurities.  We discussed options including social services and food pantry that is available through the cancer center.   He denies any concerns of interpersonal violence.  He does use alcohol which I discouraged today.  Discussed risks of electrolyte imbalances and increased risk for falls specifically.  Also long-term effect given history of hepatitis C.  Encouraged limiting to discontinuing use during treatment.  He denies any housing insecurities.  This becomes a problem, he will contact the cancer center for referral to social work.  He denies a lack of social connections and says that he is well supported through friends and family.  We discussed options for support groups available through the cancer center which are also currently available virtually.  If interested, he will notify the cancer center to connect him with nurse navigator.  We discussed that having a cancer diagnosis and cancer treatments can be significantly stressful.  We discussed options for managing stress including healthy eating, exercise, and participating in counseling services and support groups.  If he is interested in these in the future he can notify myself or primary nursing team for referrals.  He is currently quit smoking but has a long history  of tobacco use and says that he has tried to quit over 50 times over the past few years.  Currently quit 1 month ago.  I applauded his efforts to remain tobacco free and cited the  specific benefits to reducing cancer progression and/or recurrence.  I offered that should he need assistance in continuing to quit, contact cancer center for referral to quit Smart program.  He currently denies any transportation needs.  However, if this becomes an issue in the future, consider options such as: acta, paratransit, bus routes, link transit, taxi/uber/lyft, and cancer center van.  He will notify his primary oncology team if this becomes an issue.  Based on Medicaid status, he may also have some additional benefits available for transportation to appointments.  4. Palliative Care- based type of cancer and identified needs today, I feel that he will benefit from ongoing symptom management and advanced care planning with palliative care.  We will send referral today.  Can coordinate visits with upcoming appointments.  .  5. Head & Neck Cancer-given propensity of those with head and neck cancers to have significant side effects and weight loss as well as impaired food intake, I will refer him to RD, Jennet Maduro to monitor weight and trends as well as side effects.  We also discussed the role of the Symptom Management Clinic at [facility] for acute issues and methods of contacting clinic/provider. He denies needing specific assistance at this time.   Disposition Referral to palliative care Referral to Jennet Maduro, RD  Visit Diagnosis 1. Nasopharyngeal carcinoma ([place])     I discussed the assessment and treatment plan with the patient. The patient was provided an opportunity to ask questions and all were answered. The patient agreed with the plan and demonstrated an understanding of the instructions.   The patient was advised to call back or seek an in-person evaluation if the symptoms worsen or if the condition fails to improve as anticipated.   I provided 23 minutes of non face-to-face telephone visit time during this encounter, and > 50% was spent counseling as documented under my assessment  & plan.  Beckey Rutter, DNP, AGNP-C [facility] [phone] (clinic)  CC: Billey Chang, NP Jennet Maduro, RD

## 2019-05-25 ENCOUNTER — Ambulatory Visit
Admission: RE | Admit: 2019-05-25 | Discharge: 2019-05-25 | Disposition: A | Discharge status: Home / Self Care | Payer: Medicare HMO | Source: Ambulatory Visit | Attending: Oncology | Admitting: Oncology

## 2019-05-25 ENCOUNTER — Encounter
Admission: RE | Admit: 2019-05-25 | Discharge: 2019-05-25 | Disposition: A | Discharge status: Home / Self Care | Payer: Medicare HMO | Source: Ambulatory Visit | Attending: Oncology | Admitting: Oncology

## 2019-05-25 DIAGNOSIS — Z8619 Personal history of other infectious and parasitic diseases: Secondary | ICD-10-CM | POA: Diagnosis not present

## 2019-05-25 DIAGNOSIS — K8020 Calculus of gallbladder without cholecystitis without obstruction: Secondary | ICD-10-CM | POA: Insufficient documentation

## 2019-05-25 DIAGNOSIS — C119 Malignant neoplasm of nasopharynx, unspecified: Secondary | ICD-10-CM

## 2019-05-25 DIAGNOSIS — Z87891 Personal history of nicotine dependence: Secondary | ICD-10-CM | POA: Insufficient documentation

## 2019-05-25 DIAGNOSIS — Z79899 Other long term (current) drug therapy: Secondary | ICD-10-CM | POA: Diagnosis not present

## 2019-05-25 DIAGNOSIS — J439 Emphysema, unspecified: Secondary | ICD-10-CM | POA: Insufficient documentation

## 2019-05-25 DIAGNOSIS — I700 Atherosclerosis of aorta: Secondary | ICD-10-CM | POA: Insufficient documentation

## 2019-05-25 DIAGNOSIS — I10 Essential (primary) hypertension: Secondary | ICD-10-CM | POA: Insufficient documentation

## 2019-05-25 DIAGNOSIS — I6782 Cerebral ischemia: Secondary | ICD-10-CM | POA: Insufficient documentation

## 2019-05-25 DIAGNOSIS — Z7982 Long term (current) use of aspirin: Secondary | ICD-10-CM | POA: Diagnosis not present

## 2019-05-25 LAB — GLUCOSE, CAPILLARY: Glucose-Capillary: 130 mg/dL — ABNORMAL HIGH (ref 70–99)

## 2019-05-25 MED ORDER — GADOBUTROL 1 MMOL/ML IV SOLN
9.0000 mL | Freq: Once | INTRAVENOUS | Status: AC | PRN
  Administered 2019-05-25: 9 mL via INTRAVENOUS

## 2019-05-25 MED ORDER — FLUDEOXYGLUCOSE F - 18 (FDG) INJECTION
11.4000 | Freq: Once | INTRAVENOUS | Status: AC | PRN
  Administered 2019-05-25: 09:00:00 11.25 via INTRAVENOUS

## 2019-05-26 ENCOUNTER — Other Ambulatory Visit: Payer: Self-pay | Admitting: Oncology

## 2019-05-26 DIAGNOSIS — C119 Malignant neoplasm of nasopharynx, unspecified: Secondary | ICD-10-CM

## 2019-05-26 MED ORDER — LIDOCAINE-PRILOCAINE 2.5-2.5 % EX CREA: TOPICAL_CREAM | CUTANEOUS | 3 refills | Status: DC

## 2019-05-26 NOTE — Progress Notes (Signed)
START ON PATHWAY REGIMEN - Head and Neck   Cisplatin 40 mg/m2 IV D1 q7 Days + RT:   A cycle is every 7 days:     Cisplatin   **Always confirm dose/schedule in your pharmacy ordering system**  Cisplatin 80 mg/m2 IV D1 + Fluorouracil 1,000 mg/m2/day CIV D1,2,3,4 q28 Days:   A cycle is every 28 days:     Cisplatin      Fluorouracil   **Always confirm dose/schedule in your pharmacy ordering system**  Patient Characteristics: Nasopharyngeal, Stage II - IVA Disease Classification: Nasopharyngeal Current Disease Status: No Distant Metastases and No Recurrent Disease AJCC T Category: T2 AJCC N Category: N0 AJCC M Category: M0 AJCC 8 Stage Grouping: II Intent of Therapy: Curative Intent, Discussed with Patient

## 2019-05-27 ENCOUNTER — Encounter: Payer: Self-pay | Admitting: Nurse Practitioner

## 2019-05-27 ENCOUNTER — Ambulatory Visit: Payer: Medicare HMO

## 2019-05-27 ENCOUNTER — Other Ambulatory Visit: Payer: Self-pay

## 2019-05-27 ENCOUNTER — Ambulatory Visit: Payer: Medicare HMO | Admitting: Oncology

## 2019-05-27 ENCOUNTER — Ambulatory Visit
Admission: RE | Admit: 2019-05-27 | Discharge: 2019-05-27 | Disposition: A | Discharge status: Home / Self Care | Payer: Medicare HMO | Source: Ambulatory Visit | Attending: Radiation Oncology | Admitting: Radiation Oncology

## 2019-05-27 ENCOUNTER — Other Ambulatory Visit: Payer: Medicare HMO

## 2019-05-27 DIAGNOSIS — Z510 Encounter for antineoplastic radiation therapy: Secondary | ICD-10-CM | POA: Insufficient documentation

## 2019-05-27 DIAGNOSIS — C119 Malignant neoplasm of nasopharynx, unspecified: Secondary | ICD-10-CM | POA: Insufficient documentation

## 2019-05-31 ENCOUNTER — Encounter: Payer: Self-pay | Admitting: Nurse Practitioner

## 2019-05-31 ENCOUNTER — Ambulatory Visit (INDEPENDENT_AMBULATORY_CARE_PROVIDER_SITE_OTHER): Payer: Medicare HMO | Admitting: Nurse Practitioner

## 2019-05-31 ENCOUNTER — Encounter: Payer: Self-pay | Admitting: Oncology

## 2019-05-31 ENCOUNTER — Encounter: Payer: Self-pay | Admitting: *Deleted

## 2019-05-31 VITALS — Ht 69.0 in

## 2019-05-31 DIAGNOSIS — M5442 Lumbago with sciatica, left side: Secondary | ICD-10-CM

## 2019-05-31 DIAGNOSIS — C119 Malignant neoplasm of nasopharynx, unspecified: Secondary | ICD-10-CM

## 2019-05-31 DIAGNOSIS — G609 Hereditary and idiopathic neuropathy, unspecified: Secondary | ICD-10-CM

## 2019-05-31 DIAGNOSIS — J449 Chronic obstructive pulmonary disease, unspecified: Secondary | ICD-10-CM

## 2019-05-31 DIAGNOSIS — M5441 Lumbago with sciatica, right side: Secondary | ICD-10-CM | POA: Diagnosis not present

## 2019-05-31 MED ORDER — CYCLOBENZAPRINE HCL 5 MG PO TABS: 10.0000 mg | ORAL_TABLET | Freq: Two times a day (BID) | ORAL | 1 refills | Status: DC | PRN

## 2019-05-31 MED ORDER — ALBUTEROL SULFATE HFA 108 (90 BASE) MCG/ACT IN AERS: 2.0000 | INHALATION_SPRAY | Freq: Four times a day (QID) | RESPIRATORY_TRACT | 3 refills | Status: DC | PRN

## 2019-05-31 MED ORDER — GABAPENTIN 100 MG PO CAPS: 200.0000 mg | ORAL_CAPSULE | Freq: Three times a day (TID) | ORAL | 3 refills | Status: DC

## 2019-05-31 NOTE — Progress Notes (Signed)
[facility] [place], [place] [address]  Internal MEDICINE  Telephone Visit  Patient Name: [name]  [id number]  [id number]  Date of Service: 06/03/2019  I connected with the patient at 3:53pm by telephone and verified the patients identity using two identifiers.   I discussed the limitations, risks, security and privacy concerns of performing an evaluation and management service by telephone and the availability of in person appointments. I also discussed with the patient that there may be a patient responsible charge related to the service.  The patient expressed understanding and agrees to proceed.    Chief Complaint  Patient presents with  . Telephone Assessment  . Telephone Screen  . Medication Management    PT WOULD LIKE TO KNOW WHICH MEDICATIONS HE SHOULD STAY ON OR BE ON WHILE GETTING CHEMO    The patient has been contacted via telephone for follow up visit due to concerns for spread of novel coronavirus. The patient presents for routine follow up visit. 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 He will have radiation after he completes chemotherapy. He was told cancer is Stage 2 With no lymph node involvement at this time. He states that his neuropathy in his feet is bad. Both feet are tingling with some numbness. He is afraid chemotherapy will make neuropathy worse. He is currently taking gabapentin 100mg  three times daily. He started this back in October and, thus far, has not had negative side effects.       Current Medication: Outpatient Encounter Medications as of 05/31/2019  Medication Sig  . amLODipine (NORVASC) 5 MG tablet Take 1 tablet (5 mg total) by mouth daily.  Marland Kitchen antiseptic oral rinse (BIOTENE) LIQD 15 mLs by Mouth Rinse route as needed for dry mouth.  Marland Kitchen aspirin EC 81 MG tablet Take 81 mg by mouth daily.  . cyclobenzaprine (FLEXERIL) 5 MG tablet Take 2 tablets (10 mg total) by  mouth 2 (two) times daily as needed for muscle spasms.  Marland Kitchen gabapentin (NEURONTIN) 100 MG capsule Take 2 capsules (200 mg total) by mouth 3 (three) times daily.  . hydrOXYzine (ATARAX/VISTARIL) 25 MG tablet Take 1 to 2 tablets po QHS prn insomnia/anxiety  . lidocaine-prilocaine (EMLA) cream Apply to affected area once  . MAGNESIUM PO Take by mouth.  . Multiple Vitamin (MULTIVITAMIN WITH MINERALS) TABS tablet Take 1 tablet by mouth daily.  . ondansetron (ZOFRAN) 4 MG tablet Take 1 tablet (4 mg total) by mouth every 8 (eight) hours as needed for up to 10 doses for nausea or vomiting.  . [DISCONTINUED] cyclobenzaprine (FLEXERIL) 5 MG tablet Take 2 tablets (10 mg total) by mouth 2 (two) times daily as needed for muscle spasms.  . [DISCONTINUED] gabapentin (NEURONTIN) 100 MG capsule Take 1 capsule (100 mg total) by mouth 3 (three) times daily.  Marland Kitchen albuterol (VENTOLIN HFA) 108 (90 Base) MCG/ACT inhaler Inhale 2 puffs into the lungs every 6 (six) hours as needed for wheezing or shortness of breath.   No facility-administered encounter medications on file as of 05/31/2019.          Surgical History: Past Surgical History:  Procedure Laterality Date  . COLONOSCOPY WITH PROPOFOL N/A 12/16/2016   Procedure: COLONOSCOPY WITH PROPOFOL;  Surgeon: Lollie Sails, MD;  Location: [facility] ENDOSCOPY;  Service: Endoscopy;  Laterality: N/A;  . COLONOSCOPY WITH PROPOFOL N/A 03/14/2017   Procedure: COLONOSCOPY WITH PROPOFOL;  Surgeon: Lollie Sails, MD;  Location: [facility] ENDOSCOPY;  Service: Endoscopy;  Laterality: N/A;  . FOOT SURGERY Right 03/12/2002  . HEMORRHOID SURGERY    . LITHOTRIPSY    . MYRINGOTOMY WITH TUBE PLACEMENT Left 04/28/2019   Procedure: MYRINGOTOMY WITH TUBE PLACEMENT;  Surgeon: Carloyn Manner, MD;  Location: [place];  Service: ENT;  Laterality: Left;  . NASOPHARYNGOSCOPY N/A 04/28/2019   Procedure: NASOPHARYNGOSCOPY WITH BIOPSY;  Surgeon: Carloyn Manner, MD;  Location: [facility];  Service: ENT;  Laterality: N/A;  . PORTA CATH INSERTION N/A 05/20/2019   Procedure: PORTA CATH INSERTION;  Surgeon: Algernon Huxley, MD;  Location: [place] [facility] CV LAB;  Service: Cardiovascular;  Laterality: N/A;  . TONSILLECTOMY      Medical History: Past Medical History:  Diagnosis Date  . Alcohol abuse   . Benzodiazepine dependence ([place])   . Benzodiazepine withdrawal ([place])   . Chronic pain in right foot   . COPD (chronic obstructive pulmonary disease) ([place])   . Depression 08/19/2017  . Hepatitis C   . Hypertension   . Seizures ([place]) 2010  . Sleep apnea   . Syncope    questionable vasovagal    Family History: Family History  Problem Relation Age of Onset  . Arthritis Mother   . Hypertension Mother   . Cancer Father   . Kidney disease Father   . Alcohol abuse Paternal Aunt   . Depression Maternal Grandfather     Social History   Socioeconomic History  . Marital status: Divorced    Spouse name: Not on file  . Number of children: Not on file  . Years of education: Not on file  . Highest education level: Not on file  Occupational History  . Not on file  Tobacco Use  . Smoking status: Former Smoker    Packs/day: 0.50    Years: 50.00    Pack years: 25.00    Types: Cigarettes    Quit date: 02/05/2019    Years since quitting: 0.3  . Smokeless tobacco: Never Used  Substance and Sexual Activity  . Alcohol use: Yes    Alcohol/week: 7.0 standard drinks    Types: 7 Standard drinks or equivalent per week    Comment: rarely  . Drug use: Not Currently  . Sexual activity: Not on file  Other Topics Concern  . Not on file  Social History Narrative  . Not on file   Social Determinants of Health   Financial Resource Strain:   . Difficulty of Paying Living Expenses: Not on file  Food Insecurity: No Food Insecurity  . Worried About Charity fundraiser in the Last Year: Never true  . Ran Out of Food in the Last Year: Never true  Transportation Needs: No  Transportation Needs  . Lack of Transportation (Medical): No  . Lack of Transportation (Non-Medical): No  Physical Activity: Inactive  . Days of Exercise per Week: 0 days  . Minutes of Exercise per Session: 0 min  Stress:   . Feeling of Stress : Not on file  Social Connections:   . Frequency of Communication with Friends and Family: Not on file  . Frequency of Social Gatherings with Friends and Family: Not on file  . Attends Religious Services: Not on file  . Active Member of Clubs or Organizations: Not on file  . Attends Archivist Meetings: Not on file  . Marital Status: Not on file  Intimate Partner Violence: Not At Risk  . Fear of Current or Ex-Partner: No  . Emotionally Abused:  No  . Physically Abused: No  . Sexually Abused: No      Review of Systems  Constitutional: Negative for chills, fatigue and unexpected weight change.  HENT: Positive for mouth sores. Negative for congestion, rhinorrhea, sinus pressure, sinus pain, sneezing and sore throat.   Respiratory: Negative for cough, chest tightness, shortness of breath and wheezing.   Cardiovascular: Negative for chest pain and palpitations.  Gastrointestinal: Negative for abdominal pain, constipation, diarrhea, nausea and vomiting.  Endocrine: Negative for cold intolerance, heat intolerance, polydipsia and polyuria.  Musculoskeletal: Positive for arthralgias and myalgias. Negative for back pain, joint swelling and neck pain.       Bilateral foot pain.   Skin: Negative.  Negative for rash.  Allergic/Immunologic: Negative for environmental allergies.  Neurological: Negative for dizziness, tremors, numbness and headaches.  Hematological: Negative for adenopathy. Does not bruise/bleed easily.  Psychiatric/Behavioral: Negative for behavioral problems, sleep disturbance and suicidal ideas. The patient is nervous/anxious.     Today's Vitals   05/31/19 1533  Height: 5\' 9"  (1.753 m)   Body mass index is 32.49  kg/m.  Observation/Objective:  The patient is alert and oriented. He is pleasant and answering all questions appropriately. Breathing is non-labored. He is in no acute distress.    Assessment/Plan: 1. COPD, mild (HCC) Add albuterol rescue inhaler. Use two puffs every six hours as needed for wheezing/shortness of breath.  - albuterol (VENTOLIN HFA) 108 (90 Base) MCG/ACT inhaler; Inhale 2 puffs into the lungs every 6 (six) hours as needed for wheezing or shortness of breath.  Dispense: 18 g; Refill: 3  2. 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  - cyclobenzaprine (FLEXERIL) 5 MG tablet; Take 2 tablets (10 mg total) by mouth 2 (two) times daily as needed for muscle spasms.  Dispense: 45 tablet; Refill: 1  3. 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 Hereditary and idiopathic peripheral neuropathy Increase gabapentin to 200mg  up to three times daily as needed.  - gabapentin (NEURONTIN) 100 MG capsule; Take 2 capsules (200 mg total) by mouth 3 (three) times daily.  Dispense: 180 capsule; Refill: 3  General Counseling: Ladanian verbalizes understanding of the findings of today's phone visit and agrees with plan of treatment. I have discussed any further diagnostic evaluation that may be needed or ordered today. We also reviewed his medications today. he has been encouraged to call the office with any questions or concerns that should arise related to todays visit.  This patient was seen by [place] with Dr Lavera Guise as a part of collaborative care agreement  Meds ordered this encounter  Medications  . albuterol (VENTOLIN HFA) 108 (90 Base) MCG/ACT inhaler    Sig: Inhale 2 puffs into the lungs every 6 (six) hours as needed for wheezing or shortness of breath.    Dispense:  18 g    Refill:  3    Order Specific Question:   Supervising Provider     Answer:   Lavera Guise [id number]  . cyclobenzaprine (FLEXERIL) 5 MG tablet    Sig: Take 2 tablets (10 mg total) by mouth 2 (two) times daily as needed for muscle spasms.    Dispense:  45 tablet    Refill:  1    Order Specific Question:   Supervising Provider    Answer:   Lavera Guise [id number]  . gabapentin (NEURONTIN) 100  MG capsule    Sig: Take 2 capsules (200 mg total) by mouth 3 (three) times daily.    Dispense:  180 capsule    Refill:  3    Order Specific Question:   Supervising Provider    Answer:   Lavera Guise [[id number]]    Time spent: 22 Minutes    Dr Lavera Guise Internal medicine

## 2019-06-01 DIAGNOSIS — C119 Malignant neoplasm of nasopharynx, unspecified: Secondary | ICD-10-CM | POA: Diagnosis not present

## 2019-06-01 DIAGNOSIS — Z510 Encounter for antineoplastic radiation therapy: Secondary | ICD-10-CM | POA: Diagnosis not present

## 2019-06-03 ENCOUNTER — Inpatient Hospital Stay: Payer: Medicare HMO | Admitting: Oncology

## 2019-06-03 ENCOUNTER — Telehealth: Payer: Self-pay

## 2019-06-03 ENCOUNTER — Inpatient Hospital Stay: Payer: Medicare HMO

## 2019-06-03 DIAGNOSIS — J441 Chronic obstructive pulmonary disease with (acute) exacerbation: Secondary | ICD-10-CM | POA: Insufficient documentation

## 2019-06-03 DIAGNOSIS — J449 Chronic obstructive pulmonary disease, unspecified: Secondary | ICD-10-CM | POA: Insufficient documentation

## 2019-06-03 NOTE — Progress Notes (Signed)
Nutrition Assessmen:  Patient new head and neck cancer. Referral from Beckey Rutter, NP  Patient with newly diagnosed nasopharyngeal carcinoma.  Planning concurrent chemotherapy and radiation therapy.  Past medical history of COPD, hep C, depression, HTN, liver disease, etoh use.   Spoke with patient via phone for nutrition assessment. Patient reports recently got new dentures, previously did not have any teeth as was eating mostly soft foods (meatloaf, meats in stews, soups).  Reports that he eats 1 good meal per day.  Recently purchased a blender, ensure shakes and whey protein powder.  Has issues with dry mouth already and has been using biotene.  Likes pudding and peanut butter crackers.      Medications: MVI, Mag, zofran  Labs: reviewed  Anthropometrics:   Height: 69 inches Weight: 220 on 05/19/2018 Chart review patient weight 215-220 since June 2020 BMI: 32 Stable weight  Estimated Energy Needs  Kcals: 2200-2500 Protein: 110-125 g Fluid: 2.2 L  NUTRITION DIAGNOSIS: Predicted suboptimal energy intake related to planned radiation and chemotherapy treatment and associated side effects   INTERVENTION:  Discussed role RD has in current care.  Discussed ways to prepare soft, moist foods with upcoming treatment.   Discussed ways to increase calories and protein.  Will email high calorie , high protein recipes and send smoothie recipes.   Patient has contact information    MONITORING, EVALUATION, GOAL: Patient will consume adequate calories and protein to prevent weight loss during treatment   NEXT VISIT: Monday, Feb 8 after radiation  Zurri Rudden B. Zenia Resides, [place], [place] Registered Dietitian [phone] (pager)

## 2019-06-03 NOTE — Telephone Encounter (Signed)
PRIOR AUTHORIZATION FOR GABAPENTIN HAS BEEN APPROVED FROM 05/14/2019-05/12/2020.TAT

## 2019-06-07 ENCOUNTER — Ambulatory Visit
Admission: RE | Admit: 2019-06-07 | Discharge: 2019-06-07 | Disposition: A | Discharge status: Home / Self Care | Payer: Medicare HMO | Source: Ambulatory Visit | Attending: Radiation Oncology | Admitting: Radiation Oncology

## 2019-06-07 ENCOUNTER — Other Ambulatory Visit: Payer: Self-pay

## 2019-06-07 NOTE — Progress Notes (Signed)
Patient pre screened for office appointment, no questions or concerns today. Patient reminded of upcoming appointment time and date. 

## 2019-06-08 ENCOUNTER — Inpatient Hospital Stay (HOSPITAL_BASED_OUTPATIENT_CLINIC_OR_DEPARTMENT_OTHER): Payer: Medicare HMO | Admitting: Oncology

## 2019-06-08 ENCOUNTER — Inpatient Hospital Stay: Payer: Medicare HMO

## 2019-06-08 ENCOUNTER — Other Ambulatory Visit: Payer: Self-pay | Admitting: *Deleted

## 2019-06-08 ENCOUNTER — Other Ambulatory Visit: Payer: Self-pay

## 2019-06-08 ENCOUNTER — Ambulatory Visit
Admission: RE | Admit: 2019-06-08 | Discharge: 2019-06-08 | Disposition: A | Discharge status: Home / Self Care | Payer: Medicare HMO | Source: Ambulatory Visit | Attending: Radiation Oncology | Admitting: Radiation Oncology

## 2019-06-08 VITALS — BP 149/79 | HR 86 | Temp 97.3°F | Ht 69.0 in | Wt 223.0 lb

## 2019-06-08 DIAGNOSIS — F411 Generalized anxiety disorder: Secondary | ICD-10-CM

## 2019-06-08 DIAGNOSIS — Z79899 Other long term (current) drug therapy: Secondary | ICD-10-CM | POA: Diagnosis not present

## 2019-06-08 DIAGNOSIS — R1310 Dysphagia, unspecified: Secondary | ICD-10-CM | POA: Diagnosis not present

## 2019-06-08 DIAGNOSIS — Z7952 Long term (current) use of systemic steroids: Secondary | ICD-10-CM | POA: Diagnosis not present

## 2019-06-08 DIAGNOSIS — F329 Major depressive disorder, single episode, unspecified: Secondary | ICD-10-CM | POA: Diagnosis not present

## 2019-06-08 DIAGNOSIS — Z87891 Personal history of nicotine dependence: Secondary | ICD-10-CM | POA: Diagnosis not present

## 2019-06-08 DIAGNOSIS — Z5111 Encounter for antineoplastic chemotherapy: Secondary | ICD-10-CM | POA: Diagnosis not present

## 2019-06-08 DIAGNOSIS — C119 Malignant neoplasm of nasopharynx, unspecified: Secondary | ICD-10-CM

## 2019-06-08 DIAGNOSIS — Z7189 Other specified counseling: Secondary | ICD-10-CM | POA: Diagnosis not present

## 2019-06-08 DIAGNOSIS — Z510 Encounter for antineoplastic radiation therapy: Secondary | ICD-10-CM | POA: Diagnosis not present

## 2019-06-08 DIAGNOSIS — G4730 Sleep apnea, unspecified: Secondary | ICD-10-CM | POA: Diagnosis not present

## 2019-06-08 DIAGNOSIS — J449 Chronic obstructive pulmonary disease, unspecified: Secondary | ICD-10-CM | POA: Diagnosis not present

## 2019-06-08 DIAGNOSIS — Z7982 Long term (current) use of aspirin: Secondary | ICD-10-CM | POA: Diagnosis not present

## 2019-06-08 DIAGNOSIS — I10 Essential (primary) hypertension: Secondary | ICD-10-CM | POA: Diagnosis not present

## 2019-06-08 DIAGNOSIS — R69 Illness, unspecified: Secondary | ICD-10-CM | POA: Diagnosis not present

## 2019-06-08 DIAGNOSIS — F1010 Alcohol abuse, uncomplicated: Secondary | ICD-10-CM | POA: Diagnosis not present

## 2019-06-08 LAB — BASIC METABOLIC PANEL
Anion gap: 8 (ref 5–15)
BUN: 18 mg/dL (ref 8–23)
CO2: 26 mmol/L (ref 22–32)
Calcium: 9 mg/dL (ref 8.9–10.3)
Chloride: 102 mmol/L (ref 98–111)
Creatinine, Ser: 1.14 mg/dL (ref 0.61–1.24)
GFR calc Af Amer: >60 mL/min (ref >60)
GFR calc non Af Amer: >60 mL/min (ref >60)
Glucose, Bld: 135 mg/dL — ABNORMAL HIGH (ref 70–99)
Potassium: 3.5 mmol/L (ref 3.5–5.1)
Sodium: 136 mmol/L (ref 135–145)

## 2019-06-08 LAB — CBC WITH DIFFERENTIAL/PLATELET
Abs Immature Granulocytes: 0.01 10*3/uL (ref 0.00–0.07)
Basophils Absolute: 0 10*3/uL (ref 0.0–0.1)
Basophils Relative: 1 %
Eosinophils Absolute: 0.3 10*3/uL (ref 0.0–0.5)
Eosinophils Relative: 4 %
HCT: 43.9 % (ref 39.0–52.0)
Hemoglobin: 14.7 g/dL (ref 13.0–17.0)
Immature Granulocytes: 0 %
Lymphocytes Relative: 35 %
Lymphs Abs: 2.9 10*3/uL (ref 0.7–4.0)
MCH: 30.1 pg (ref 26.0–34.0)
MCHC: 33.5 g/dL (ref 30.0–36.0)
MCV: 90 fL (ref 80.0–100.0)
Monocytes Absolute: 1 10*3/uL (ref 0.1–1.0)
Monocytes Relative: 12 %
Neutro Abs: 4.1 10*3/uL (ref 1.7–7.7)
Neutrophils Relative %: 48 %
Platelets: 189 10*3/uL (ref 150–400)
RBC: 4.88 MIL/uL (ref 4.22–5.81)
RDW: 13.2 % (ref 11.5–15.5)
WBC: 8.4 10*3/uL (ref 4.0–10.5)
nRBC: 0 % (ref 0.0–0.2)

## 2019-06-08 LAB — MAGNESIUM: Magnesium: 1.9 mg/dL (ref 1.7–2.4)

## 2019-06-08 MED ORDER — SODIUM CHLORIDE 0.9 % IV SOLN
40.0000 mg/m2 | Freq: Once | INTRAVENOUS | Status: AC
  Administered 2019-06-08: 88 mg via INTRAVENOUS
  Filled 2019-06-08: qty 88

## 2019-06-08 MED ORDER — ONDANSETRON HCL 8 MG PO TABS: 8.0000 mg | ORAL_TABLET | Freq: Two times a day (BID) | ORAL | 1 refills | Status: DC | PRN

## 2019-06-08 MED ORDER — HEPARIN SOD (PORK) LOCK FLUSH 100 UNIT/ML IV SOLN
500.0000 [IU] | Freq: Once | INTRAVENOUS | Status: DC | PRN
  Filled 2019-06-08: qty 5

## 2019-06-08 MED ORDER — PALONOSETRON HCL INJECTION 0.25 MG/5ML
0.2500 mg | Freq: Once | INTRAVENOUS | Status: AC
  Administered 2019-06-08: 0.25 mg via INTRAVENOUS
  Filled 2019-06-08: qty 5

## 2019-06-08 MED ORDER — SODIUM CHLORIDE 0.9% FLUSH
10.0000 mL | Freq: Once | INTRAVENOUS | Status: DC
  Filled 2019-06-08: qty 10

## 2019-06-08 MED ORDER — HEPARIN SOD (PORK) LOCK FLUSH 100 UNIT/ML IV SOLN
500.0000 [IU] | Freq: Once | INTRAVENOUS | Status: DC
  Filled 2019-06-08: qty 5

## 2019-06-08 MED ORDER — SODIUM CHLORIDE 0.9 % IV SOLN
150.0000 mg | Freq: Once | INTRAVENOUS | Status: AC
  Administered 2019-06-08: 150 mg via INTRAVENOUS
  Filled 2019-06-08: qty 150

## 2019-06-08 MED ORDER — SODIUM CHLORIDE 0.9% FLUSH
10.0000 mL | Freq: Once | INTRAVENOUS | Status: AC
  Administered 2019-06-08: 10 mL via INTRAVENOUS
  Filled 2019-06-08: qty 10

## 2019-06-08 MED ORDER — POTASSIUM CHLORIDE 2 MEQ/ML IV SOLN
Freq: Once | INTRAVENOUS | Status: AC
  Filled 2019-06-08: qty 1000

## 2019-06-08 MED ORDER — DEXAMETHASONE 4 MG PO TABS: ORAL_TABLET | ORAL | 1 refills | Status: DC

## 2019-06-08 MED ORDER — DIPHENHYDRAMINE HCL 50 MG/ML IJ SOLN
25.0000 mg | Freq: Once | INTRAMUSCULAR | Status: AC
  Administered 2019-06-08: 25 mg via INTRAVENOUS

## 2019-06-08 MED ORDER — SODIUM CHLORIDE 0.9 % IV SOLN
10.0000 mg | Freq: Once | INTRAVENOUS | Status: AC
  Administered 2019-06-08: 10 mg via INTRAVENOUS
  Filled 2019-06-08: qty 1

## 2019-06-08 MED ORDER — ALPRAZOLAM 0.5 MG PO TABS: 0.5000 mg | ORAL_TABLET | Freq: Four times a day (QID) | ORAL | 0 refills | Status: DC | PRN

## 2019-06-08 MED ORDER — METHYLPREDNISOLONE SODIUM SUCC 125 MG IJ SOLR
125.0000 mg | Freq: Once | INTRAMUSCULAR | Status: AC
  Administered 2019-06-08: 125 mg via INTRAVENOUS

## 2019-06-08 MED ORDER — SODIUM CHLORIDE 0.9 % IV SOLN
Freq: Once | INTRAVENOUS | Status: AC
  Filled 2019-06-08: qty 250

## 2019-06-08 MED ORDER — PROCHLORPERAZINE MALEATE 10 MG PO TABS: 10.0000 mg | ORAL_TABLET | Freq: Four times a day (QID) | ORAL | 1 refills | Status: DC | PRN

## 2019-06-08 MED ORDER — FAMOTIDINE IN NACL 20-0.9 MG/50ML-% IV SOLN
20.0000 mg | Freq: Once | INTRAVENOUS | Status: AC
  Administered 2019-06-08: 20 mg via INTRAVENOUS

## 2019-06-08 MED ORDER — HEPARIN SOD (PORK) LOCK FLUSH 100 UNIT/ML IV SOLN
500.0000 [IU] | Freq: Once | INTRAVENOUS | Status: AC
  Administered 2019-06-08: 500 [IU] via INTRAVENOUS
  Filled 2019-06-08: qty 5

## 2019-06-08 MED ORDER — LORAZEPAM 2 MG/ML IJ SOLN
0.2500 mg | Freq: Once | INTRAMUSCULAR | Status: AC
  Administered 2019-06-08: 0.25 mg via INTRAVENOUS
  Filled 2019-06-08: qty 1

## 2019-06-08 NOTE — Progress Notes (Signed)
1305: Pt reports "My throat feels scratchy and my voice is hoarse". Pt denies any other symptoms at this time, including SOB, pt currently eating lunch without difficulty.  Cisplatin stopped, NS started, and Beckey Rutter NP aware.  1308: Benadryl 25MG  IV given 1309: Solumedrol 125mg  IV given, Beckey Rutter NP at chairside.  1313: Pt reports that symptoms have improved but are still present. Pepcid 20 mg IV given per Beckey Rutter NP.  [id number]: Pt reports that symptoms continue to improve.  1318: Dr. Janese Banks at chairside.  1322: Pt reports symptoms resolved, Per Dr. Janese Banks okay to restart Cisplatin. Per Beckey Rutter NP start Cisplatin at 50% the ordered rate, and increase by 25% every 15 minutes until max.  *Pt denies SOB at all times, pt remains stable at all times* *See flow sheets for VS. VS remains stable at all times. * 1325: Cisplatin restarted.   1520: Pt tolerated remainder of infusion well. Pt denies any concerns, questions or complaints at this time. No s/s of distress noted. Pt educated to call clinic with any questions or to report to ER/call 911 in the event of an emergency, such as but not limited to SOB. Pt and VS stable at discharge.

## 2019-06-08 NOTE — Progress Notes (Signed)
Patient stated that he had been doing well with no concerns. 

## 2019-06-09 ENCOUNTER — Other Ambulatory Visit: Payer: Self-pay

## 2019-06-09 ENCOUNTER — Ambulatory Visit
Admission: RE | Admit: 2019-06-09 | Discharge: 2019-06-09 | Disposition: A | Discharge status: Home / Self Care | Payer: Medicare HMO | Source: Ambulatory Visit | Attending: Radiation Oncology | Admitting: Radiation Oncology

## 2019-06-09 ENCOUNTER — Telehealth: Payer: Self-pay

## 2019-06-09 DIAGNOSIS — Z510 Encounter for antineoplastic radiation therapy: Secondary | ICD-10-CM | POA: Diagnosis not present

## 2019-06-09 DIAGNOSIS — C119 Malignant neoplasm of nasopharynx, unspecified: Secondary | ICD-10-CM | POA: Diagnosis not present

## 2019-06-09 NOTE — Telephone Encounter (Signed)
Telephone call to patient for follow up after receiving first infusion yesterday.   Pt states did well with his chemo and is feeling great today.  No complaints voiced.  Eating and drinking plenty of fluids.  Encouraged pt to call for any questions or concerns.

## 2019-06-10 ENCOUNTER — Ambulatory Visit: Payer: Medicare HMO

## 2019-06-10 ENCOUNTER — Ambulatory Visit: Payer: Medicare HMO | Admitting: Adult Health

## 2019-06-10 ENCOUNTER — Encounter: Payer: Self-pay | Admitting: Oncology

## 2019-06-10 ENCOUNTER — Telehealth: Payer: Self-pay | Admitting: *Deleted

## 2019-06-10 NOTE — Progress Notes (Signed)
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  Pryor Ochoa for symptoms of ear pain and discharge.  Patient also noticed associated hearing loss and tinnitus.  Patient was noted to have erythema and edema in the left sphenopalatine fossa as well as an exophytic mass on NPL exam.  CT soft tissue of the neck showed asymmetric soft tissue within the left nasopharynx measuring 2.4 x 2 x 3.2 cm.  There is lateral bulging with encroachment upon the left parapharyngeal space without frank infiltration.  No extension into oropharynx or nasal cavity.  No skull base infiltration.  No appreciable mass within the larynx.  No pathologically enlarged cervical lymph nodes.  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  He has ongoing diminished hearing in his left ear  ECOG PS- 1 Pain scale- 0   Review of systems- Review of Systems  Constitutional: Negative for chills, fever, malaise/fatigue and weight loss.  HENT: Positive for hearing loss. Negative for congestion, ear discharge and nosebleeds.   Eyes: Negative for blurred vision.  Respiratory: Negative for cough,  hemoptysis, sputum production, shortness of breath and wheezing.   Cardiovascular: Negative for chest pain, palpitations, orthopnea and claudication.  Gastrointestinal: Negative for abdominal pain, blood in stool, constipation, diarrhea, heartburn, melena, nausea and vomiting.  Genitourinary: Negative for dysuria, flank pain, frequency, hematuria and urgency.  Musculoskeletal: Negative for back pain, joint pain and myalgias.  Skin: Negative for rash.  Neurological: Negative for dizziness, tingling, focal weakness, seizures, weakness and headaches.  Endo/Heme/Allergies: Does not bruise/bleed easily.  Psychiatric/Behavioral: Negative for depression and suicidal ideas. The patient is nervous/anxious. The patient does not have insomnia.       Allergies  Allergen Reactions   Opana [Oxymorphone Hcl]     Made him BLACKOUT   Amoxicillin Other (See Comments)   Oxymorphone Other (See Comments)   Sulfa Antibiotics Other (See Comments)   Sulfur     Childhood reaction      Past Medical History:  Diagnosis Date   Alcohol abuse    Benzodiazepine dependence ([place])    Benzodiazepine withdrawal ([place])    Chronic pain in right foot    COPD (chronic obstructive pulmonary disease) ([place])    Depression 08/19/2017   Hepatitis C    Hypertension    Seizures ([place]) 2010   Sleep apnea    Syncope    questionable vasovagal     Past Surgical History:  Procedure Laterality Date   COLONOSCOPY WITH PROPOFOL N/A 12/16/2016   Procedure: COLONOSCOPY WITH PROPOFOL;  Surgeon: Lollie Sails, MD;  Location: [facility] ENDOSCOPY;  Service: Endoscopy;  Laterality: N/A;   COLONOSCOPY WITH PROPOFOL N/A 03/14/2017   Procedure: COLONOSCOPY WITH PROPOFOL;  Surgeon: Lollie Sails, MD;  Location: [facility] ENDOSCOPY;  Service: Endoscopy;  Laterality: N/A;   FOOT SURGERY Right 03/12/2002   HEMORRHOID SURGERY     LITHOTRIPSY     MYRINGOTOMY WITH TUBE PLACEMENT Left 04/28/2019   Procedure: MYRINGOTOMY  WITH TUBE PLACEMENT;  Surgeon: Carloyn Manner, MD;  Location: [place];  Service: ENT;  Laterality: Left;   NASOPHARYNGOSCOPY N/A 04/28/2019   Procedure: NASOPHARYNGOSCOPY WITH BIOPSY;  Surgeon: Carloyn Manner, MD;  Location: [place];  Service: ENT;  Laterality: N/A;   PORTA CATH INSERTION N/A 05/20/2019   Procedure: PORTA CATH INSERTION;  Surgeon: Algernon Huxley, MD;  Location: [place] CV LAB;  Service: Cardiovascular;  Laterality: N/A;   TONSILLECTOMY      Social History   Socioeconomic History   Marital status: Divorced    Spouse name: Not on file   Number of children: Not on file   Years of education: Not on file   Highest education level: Not on file  Occupational History   Not on file  Tobacco Use   Smoking status: Former Smoker    Packs/day: 0.50    Years: 50.00    Pack years: 25.00    Types: Cigarettes    Quit date: 02/05/2019    Years since quitting: 0.3   Smokeless tobacco: Never Used  Substance and Sexual Activity   Alcohol use: Yes    Alcohol/week: 7.0 standard drinks    Types: 7 Standard drinks or equivalent per week    Comment: rarely   Drug use: Not Currently   Sexual activity: Not on file  Other Topics Concern   Not on file  Social History Narrative   Not on file   Social Determinants of Health   Financial Resource Strain:    Difficulty of Paying Living Expenses: Not on file  Food Insecurity: No Food Insecurity   Worried About Running Out of Food in the Last Year: Never true   Ran Out of Food in the Last Year: Never true  Transportation Needs: No Transportation Needs   Lack of Transportation (Medical): No   Lack of Transportation (Non-Medical): No  Physical Activity: Inactive   Days of Exercise per Week: 0 days   Minutes of Exercise per Session: 0 min  Stress:    Feeling of Stress : Not on file  Social Connections:    Frequency of Communication with Friends and Family: Not on file    Frequency of Social Gatherings with Friends and Family: Not on file   Attends Religious Services: Not on file   Active Member of Clubs or Organizations: Not on file   Attends Archivist Meetings: Not on file   Marital Status: Not on file  Intimate Partner Violence: Not At Risk   Fear of Current or Ex-Partner: No   Emotionally Abused: No   Physically Abused: No   Sexually Abused: No    Family History  Problem Relation Age of Onset   Arthritis Mother    Hypertension Mother    Cancer Father    Kidney disease Father    Alcohol abuse Paternal Aunt    Depression Maternal Grandfather      Current Outpatient Medications:    albuterol (VENTOLIN HFA) 108 (90 Base) MCG/ACT inhaler, Inhale 2 puffs into the lungs every 6 (six) hours as needed for wheezing or shortness of breath., Disp: 18 g, Rfl: 3   amLODipine (NORVASC) 5 MG tablet, Take 1 tablet (5 mg total) by mouth daily., Disp: 90 tablet, Rfl: 3   antiseptic oral rinse (  BIOTENE) LIQD, 15 mLs by Mouth Rinse route as needed for dry mouth., Disp: , Rfl:    aspirin EC 81 MG tablet, Take 81 mg by mouth daily., Disp: , Rfl:    cyclobenzaprine (FLEXERIL) 5 MG tablet, Take 2 tablets (10 mg total) by mouth 2 (two) times daily as needed for muscle spasms., Disp: 45 tablet, Rfl: 1   gabapentin (NEURONTIN) 100 MG capsule, Take 2 capsules (200 mg total) by mouth 3 (three) times daily., Disp: 180 capsule, Rfl: 3   hydrOXYzine (ATARAX/VISTARIL) 25 MG tablet, Take 1 to 2 tablets po QHS prn insomnia/anxiety, Disp: 60 tablet, Rfl: 2   lidocaine-prilocaine (EMLA) cream, Apply to affected area once, Disp: 30 g, Rfl: 3   MAGNESIUM PO, Take by mouth., Disp: , Rfl:    Multiple Vitamin (MULTIVITAMIN WITH MINERALS) TABS tablet, Take 1 tablet by mouth daily., Disp: , Rfl:    ondansetron (ZOFRAN) 4 MG tablet, Take 1 tablet (4 mg total) by mouth every 8 (eight) hours as needed for up to 10 doses for nausea or vomiting., Disp: 20  tablet, Rfl: 0   ALPRAZolam (XANAX) 0.5 MG tablet, Take 1 tablet (0.5 mg total) by mouth every 6 (six) hours as needed for anxiety., Disp: 60 tablet, Rfl: 0   dexamethasone (DECADRON) 4 MG tablet, Take 2 tablets by mouth once a day on the day after chemotherapy and then take 2 tablets two times a day for 2 days. Take with food., Disp: 30 tablet, Rfl: 1   ondansetron (ZOFRAN) 8 MG tablet, Take 1 tablet (8 mg total) by mouth 2 (two) times daily as needed. Start on the third day after chemotherapy., Disp: 30 tablet, Rfl: 1   prochlorperazine (COMPAZINE) 10 MG tablet, Take 1 tablet (10 mg total) by mouth every 6 (six) hours as needed (Nausea or vomiting)., Disp: 30 tablet, Rfl: 1  Physical exam:  Vitals:   06/08/19 0856  BP: (!) 149/79  Pulse: 86  Temp: (!) 97.3 F (36.3 C)  TempSrc: Tympanic  Weight: 223 lb (101.2 kg)  Height: 5\' 9"  (1.753 m)   Physical Exam Constitutional:      General: He is not in acute distress. HENT:     Head: Normocephalic and atraumatic.  Eyes:     Pupils: Pupils are equal, round, and reactive to light.  Cardiovascular:     Rate and Rhythm: Normal rate and regular rhythm.     Heart sounds: Normal heart sounds.  Pulmonary:     Effort: Pulmonary effort is normal.     Breath sounds: Normal breath sounds.  Abdominal:     General: Bowel sounds are normal.     Palpations: Abdomen is soft.  Musculoskeletal:     Cervical back: Normal range of motion.  Skin:    General: Skin is warm and dry.  Neurological:     Mental Status: He is alert and oriented to person, place, and time.      CMP Latest Ref Rng & Units 06/08/2019  Glucose 70 - 99 mg/dL 135(H)  BUN 8 - 23 mg/dL 18  Creatinine 0.61 - 1.24 mg/dL 1.14  Sodium 135 - 145 mmol/L 136  Potassium 3.5 - 5.1 mmol/L 3.5  Chloride 98 - 111 mmol/L 102  CO2 22 - 32 mmol/L 26  Calcium 8.9 - 10.3 mg/dL 9.0  Total Protein 6.5 - 8.1 g/dL -  Total Bilirubin 0.3 - 1.2 mg/dL -  Alkaline Phos 38 - 126 U/L -  AST 15  - 41 U/L -  ALT 0 - 44 U/L -   CBC Latest Ref Rng & Units 06/08/2019  WBC 4.0 - 10.5 K/uL 8.4  Hemoglobin 13.0 - 17.0 g/dL 14.7  Hematocrit 39.0 - 52.0 % 43.9  Platelets 150 - 400 K/uL 189    No images are attached to the encounter.  MR Brain W Wo Contrast  Result Date: 05/25/2019 CLINICAL DATA:  New nasopharyngeal carcinoma. EXAM: MRI HEAD WITHOUT AND WITH CONTRAST TECHNIQUE: Multiplanar, multiecho pulse sequences of the brain and surrounding structures were obtained without and with intravenous contrast. CONTRAST:  33mL GADAVIST GADOBUTROL 1 MMOL/ML IV SOLN COMPARISON:  MRI head 82,015 FINDINGS: Brain: Mild cortical atrophy. Negative for hydrocephalus. Scattered small white matter hyperintensities consistent with chronic microvascular ischemia. Negative for acute infarct or hemorrhage. No intracranial mass lesion. Vascular: Normal arterial flow voids. Skull and upper cervical spine: No skeletal lesion identified. Sinuses/Orbits: Enhancing mass lesion in the left nasopharynx measuring approximately 12 x 18 x 30 mm. This is most compatible with primary carcinoma of the nasopharynx on the left. No definite perineural spread of tumor although postcontrast fat suppression imaging not obtained. Mucosal edema paranasal sinuses. Left mastoid effusion. Normal orbit Other: None IMPRESSION: 1. Atrophy and chronic microvascular ischemic change in the white matter. No acute intracranial abnormality 2. Enhancing solid mass left nasopharynx compatible with carcinoma. No definite perineural spread however fat suppressed postcontrast imaging not obtained Electronically Signed   By: Franchot Gallo M.D.   On: 05/25/2019 14:03   PERIPHERAL VASCULAR CATHETERIZATION  Result Date: 05/20/2019 See op note  NM PET Image Initial (PI) Skull Base To Thigh  Result Date: 05/28/2019 CLINICAL DATA:  Initial treatment strategy for nasopharyngeal carcinoma. Status post biopsy 04/28/2019. Port-A-Cath placement. Alcohol abuse.  Hepatitis C. Smoker. EXAM: NUCLEAR MEDICINE PET SKULL BASE TO THIGH TECHNIQUE: 11.3 mCi F-18 FDG was injected intravenously. Full-ring PET imaging was performed from the skull base to thigh after the radiotracer. CT data was obtained and used for attenuation correction and anatomic localization. Fasting blood glucose: 130 mg/dl COMPARISON:  04/21/2019 neck CT. Chest CT from 01/11/2006 is reviewed. FINDINGS: Mediastinal blood pool activity: SUV max 2.4 Liver activity: SUV max NA NECK: Left nasopharyngeal primary measures on the order of 2.6 x 2.3 cm and a S.U.V. max of 17.8 on 19/3. No cervical nodal hypermetabolism. Incidental CT findings: No cervical adenopathy. Bilateral carotid atherosclerosis. CHEST: No pulmonary parenchymal or thoracic nodal hypermetabolism. Incidental CT findings: Right Port-A-Cath tip at low SVC. Aortic atherosclerosis. No thoracic adenopathy. Favor extrapleural fat prominence at the anterior right lung base measuring 5 mm on 129/3. Mild centrilobular emphysema. ABDOMEN/PELVIS: Prominent porta hepatis nodes with hypermetabolism. Example 1.2 cm and a S.U.V. max of 3.4 on 157/3. Incidental CT findings: Caudate prominence and suggestion of medial segment left liver lobe atrophy. Stone within the gallbladder neck measures 1.9 cm. Normal adrenal glands. Abdominal aortic atherosclerosis. SKELETON: No abnormal marrow activity. Incidental CT findings: none IMPRESSION: 1. Left nasopharyngeal primary, without cervical nodal metastasis. 2. Hepatic morphology for which mild cirrhosis cannot be excluded, especially given history of alcohol abuse and hepatitis C. Prominent porta hepatis nodes with hypermetabolism. Favored to be reactive and related to hepatic disease 3. Incidental findings, including: Aortic atherosclerosis (ICD10-I70.0) and emphysema (ICD10-J43.9). Cholelithiasis. Electronically Signed   By: Abigail Miyamoto M.D.   On: 05/28/2019 11:30     Assessment and plan- Patient is a 69 y.o. male  with history of nasopharyngeal carcinoma stage II T2 N0 M0 here for on treatment assessment prior to cycle  1 of weekly cisplatin chemotherapy  Patient will be starting weekly cisplatin chemotherapy at 40 mg per metered squared today.  Plan is to do weekly treatment along with radiation which will likely go on for 7 weeks.  Counts are otherwise okay to proceed with cycle 1 of cisplatin chemotherapy today and I will see him back in 1 week for cycle 2.  Discussed risks and benefits of chemotherapy including all but not limited to nausea, vomiting, low blood counts, risk of infections and hospitalization.  Risk of peripheral neuropathy, renal dysfunction and potential worsening of his hearing with cisplatin.  Patient understands and agrees to proceed.  Treatment is being given with curative intent.  I will add as needed Ativan for his anxiety.   Visit Diagnosis 1. Nasopharyngeal carcinoma ([place])   2. Encounter for antineoplastic chemotherapy   3. Goals of care, counseling/discussion      Dr. Randa Evens, MD, MPH [facility] [id number] 06/10/2019 8:42 AM

## 2019-06-10 NOTE — Telephone Encounter (Signed)
Called pt after I got a message from [place] in radiation that pt was not coming to radiation today. I called him this afternoon and he said that yest. Dr Janese Banks came back to chemo area because he has hoarseness and sore throat. He did get some extra meds and he was fine and got his chemo. He has no temp, no wheezing. He has not ate anything today but drank sprite and it tastes good. He got up early and called radiation and told them he was not coming. When I called him he states he has right chest discomfort at times. He does not feel like it is chest pain. I spoke to Janese Banks and she states that if he has chest pain he should go to ER. He can use inhaler if he feels like it is usuall tightness that comes from lung issues and he also has xanax to help relax him and it usually helps muscle relaxing. He states that he thinks he will be in radiation tom. I told him that if h needs anything to let us know. He agrees

## 2019-06-11 ENCOUNTER — Ambulatory Visit: Payer: Medicare HMO

## 2019-06-14 ENCOUNTER — Ambulatory Visit
Admission: RE | Admit: 2019-06-14 | Discharge: 2019-06-14 | Disposition: A | Discharge status: Home / Self Care | Payer: Medicare HMO | Source: Ambulatory Visit | Attending: Radiation Oncology | Admitting: Radiation Oncology

## 2019-06-14 ENCOUNTER — Other Ambulatory Visit: Payer: Self-pay | Admitting: *Deleted

## 2019-06-14 ENCOUNTER — Other Ambulatory Visit: Payer: Self-pay

## 2019-06-14 DIAGNOSIS — C119 Malignant neoplasm of nasopharynx, unspecified: Secondary | ICD-10-CM | POA: Diagnosis not present

## 2019-06-14 DIAGNOSIS — Z510 Encounter for antineoplastic radiation therapy: Secondary | ICD-10-CM | POA: Insufficient documentation

## 2019-06-14 MED ORDER — MAGIC MOUTHWASH W/LIDOCAINE: 5.0000 mL | Freq: Four times a day (QID) | ORAL | 3 refills | Status: DC | PRN

## 2019-06-15 ENCOUNTER — Inpatient Hospital Stay: Payer: Medicare HMO | Attending: Oncology

## 2019-06-15 ENCOUNTER — Ambulatory Visit
Admission: RE | Admit: 2019-06-15 | Discharge: 2019-06-15 | Disposition: A | Discharge status: Home / Self Care | Payer: Medicare HMO | Source: Ambulatory Visit | Attending: Radiation Oncology | Admitting: Radiation Oncology

## 2019-06-15 ENCOUNTER — Other Ambulatory Visit: Payer: Self-pay

## 2019-06-15 ENCOUNTER — Encounter: Payer: Self-pay | Admitting: Oncology

## 2019-06-15 ENCOUNTER — Inpatient Hospital Stay (HOSPITAL_BASED_OUTPATIENT_CLINIC_OR_DEPARTMENT_OTHER): Payer: Medicare HMO | Admitting: Oncology

## 2019-06-15 ENCOUNTER — Inpatient Hospital Stay: Payer: Medicare HMO

## 2019-06-15 VITALS — BP 125/68 | HR 72 | Temp 95.7°F | Resp 16 | Wt 220.0 lb

## 2019-06-15 DIAGNOSIS — Z79899 Other long term (current) drug therapy: Secondary | ICD-10-CM | POA: Insufficient documentation

## 2019-06-15 DIAGNOSIS — Y842 Radiological procedure and radiotherapy as the cause of abnormal reaction of the patient, or of later complication, without mention of misadventure at the time of the procedure: Secondary | ICD-10-CM | POA: Diagnosis not present

## 2019-06-15 DIAGNOSIS — N179 Acute kidney failure, unspecified: Secondary | ICD-10-CM | POA: Insufficient documentation

## 2019-06-15 DIAGNOSIS — C119 Malignant neoplasm of nasopharynx, unspecified: Secondary | ICD-10-CM | POA: Diagnosis not present

## 2019-06-15 DIAGNOSIS — I10 Essential (primary) hypertension: Secondary | ICD-10-CM | POA: Insufficient documentation

## 2019-06-15 DIAGNOSIS — B1920 Unspecified viral hepatitis C without hepatic coma: Secondary | ICD-10-CM | POA: Diagnosis not present

## 2019-06-15 DIAGNOSIS — Z510 Encounter for antineoplastic radiation therapy: Secondary | ICD-10-CM | POA: Diagnosis not present

## 2019-06-15 DIAGNOSIS — Z7952 Long term (current) use of systemic steroids: Secondary | ICD-10-CM | POA: Insufficient documentation

## 2019-06-15 DIAGNOSIS — J029 Acute pharyngitis, unspecified: Secondary | ICD-10-CM | POA: Insufficient documentation

## 2019-06-15 DIAGNOSIS — Z8249 Family history of ischemic heart disease and other diseases of the circulatory system: Secondary | ICD-10-CM | POA: Insufficient documentation

## 2019-06-15 DIAGNOSIS — Z87891 Personal history of nicotine dependence: Secondary | ICD-10-CM | POA: Insufficient documentation

## 2019-06-15 DIAGNOSIS — D696 Thrombocytopenia, unspecified: Secondary | ICD-10-CM | POA: Diagnosis not present

## 2019-06-15 DIAGNOSIS — F329 Major depressive disorder, single episode, unspecified: Secondary | ICD-10-CM | POA: Diagnosis not present

## 2019-06-15 DIAGNOSIS — Z5111 Encounter for antineoplastic chemotherapy: Secondary | ICD-10-CM | POA: Insufficient documentation

## 2019-06-15 DIAGNOSIS — R12 Heartburn: Secondary | ICD-10-CM

## 2019-06-15 DIAGNOSIS — K208 Other esophagitis without bleeding: Secondary | ICD-10-CM | POA: Diagnosis not present

## 2019-06-15 DIAGNOSIS — Z7982 Long term (current) use of aspirin: Secondary | ICD-10-CM | POA: Diagnosis not present

## 2019-06-15 DIAGNOSIS — K1230 Oral mucositis (ulcerative), unspecified: Secondary | ICD-10-CM | POA: Diagnosis not present

## 2019-06-15 DIAGNOSIS — Z8261 Family history of arthritis: Secondary | ICD-10-CM | POA: Insufficient documentation

## 2019-06-15 DIAGNOSIS — Z95828 Presence of other vascular implants and grafts: Secondary | ICD-10-CM

## 2019-06-15 DIAGNOSIS — J449 Chronic obstructive pulmonary disease, unspecified: Secondary | ICD-10-CM | POA: Diagnosis not present

## 2019-06-15 DIAGNOSIS — F1010 Alcohol abuse, uncomplicated: Secondary | ICD-10-CM | POA: Insufficient documentation

## 2019-06-15 DIAGNOSIS — R5383 Other fatigue: Secondary | ICD-10-CM | POA: Insufficient documentation

## 2019-06-15 LAB — CBC WITH DIFFERENTIAL/PLATELET
Abs Immature Granulocytes: 0.06 10*3/uL (ref 0.00–0.07)
Basophils Absolute: 0 10*3/uL (ref 0.0–0.1)
Basophils Relative: 0 %
Eosinophils Absolute: 0.3 10*3/uL (ref 0.0–0.5)
Eosinophils Relative: 3 %
HCT: 44.4 % (ref 39.0–52.0)
Hemoglobin: 14.9 g/dL (ref 13.0–17.0)
Immature Granulocytes: 1 %
Lymphocytes Relative: 22 %
Lymphs Abs: 2.6 10*3/uL (ref 0.7–4.0)
MCH: 29.8 pg (ref 26.0–34.0)
MCHC: 33.6 g/dL (ref 30.0–36.0)
MCV: 88.8 fL (ref 80.0–100.0)
Monocytes Absolute: 1.4 10*3/uL — ABNORMAL HIGH (ref 0.1–1.0)
Monocytes Relative: 12 %
Neutro Abs: 7.3 10*3/uL (ref 1.7–7.7)
Neutrophils Relative %: 62 %
Platelets: 170 10*3/uL (ref 150–400)
RBC: 5 MIL/uL (ref 4.22–5.81)
RDW: 13 % (ref 11.5–15.5)
WBC: 11.6 10*3/uL — ABNORMAL HIGH (ref 4.0–10.5)
nRBC: 0 % (ref 0.0–0.2)

## 2019-06-15 LAB — BASIC METABOLIC PANEL
Anion gap: 8 (ref 5–15)
BUN: 25 mg/dL — ABNORMAL HIGH (ref 8–23)
CO2: 28 mmol/L (ref 22–32)
Calcium: 8.8 mg/dL — ABNORMAL LOW (ref 8.9–10.3)
Chloride: 100 mmol/L (ref 98–111)
Creatinine, Ser: 1.19 mg/dL (ref 0.61–1.24)
GFR calc Af Amer: >60 mL/min (ref >60)
GFR calc non Af Amer: >60 mL/min (ref >60)
Glucose, Bld: 118 mg/dL — ABNORMAL HIGH (ref 70–99)
Potassium: 3.7 mmol/L (ref 3.5–5.1)
Sodium: 136 mmol/L (ref 135–145)

## 2019-06-15 MED ORDER — PALONOSETRON HCL INJECTION 0.25 MG/5ML
0.2500 mg | Freq: Once | INTRAVENOUS | Status: AC
  Administered 2019-06-15: 0.25 mg via INTRAVENOUS
  Filled 2019-06-15: qty 5

## 2019-06-15 MED ORDER — SODIUM CHLORIDE 0.9% FLUSH
10.0000 mL | Freq: Once | INTRAVENOUS | Status: AC
  Administered 2019-06-15: 08:00:00 10 mL via INTRAVENOUS
  Filled 2019-06-15: qty 10

## 2019-06-15 MED ORDER — FAMOTIDINE IN NACL 20-0.9 MG/50ML-% IV SOLN
20.0000 mg | Freq: Once | INTRAVENOUS | Status: AC
  Administered 2019-06-15: 20 mg via INTRAVENOUS
  Filled 2019-06-15: qty 50

## 2019-06-15 MED ORDER — SODIUM CHLORIDE 0.9 % IV SOLN
150.0000 mg | Freq: Once | INTRAVENOUS | Status: AC
  Administered 2019-06-15: 150 mg via INTRAVENOUS
  Filled 2019-06-15: qty 5

## 2019-06-15 MED ORDER — PANTOPRAZOLE SODIUM 20 MG PO TBEC: 20.0000 mg | DELAYED_RELEASE_TABLET | Freq: Every day | ORAL | 1 refills | Status: DC

## 2019-06-15 MED ORDER — POTASSIUM CHLORIDE 2 MEQ/ML IV SOLN
Freq: Once | INTRAVENOUS | Status: AC
  Filled 2019-06-15: qty 1000

## 2019-06-15 MED ORDER — SODIUM CHLORIDE 0.9 % IV SOLN
Freq: Once | INTRAVENOUS | Status: AC
  Filled 2019-06-15: qty 250

## 2019-06-15 MED ORDER — HEPARIN SOD (PORK) LOCK FLUSH 100 UNIT/ML IV SOLN
500.0000 [IU] | Freq: Once | INTRAVENOUS | Status: AC | PRN
  Administered 2019-06-15: 500 [IU]
  Filled 2019-06-15: qty 5

## 2019-06-15 MED ORDER — SODIUM CHLORIDE 0.9 % IV SOLN
40.0000 mg/m2 | Freq: Once | INTRAVENOUS | Status: AC
  Administered 2019-06-15: 14:00:00 88 mg via INTRAVENOUS
  Filled 2019-06-15: qty 88

## 2019-06-15 MED ORDER — SODIUM CHLORIDE 0.9 % IV SOLN
10.0000 mg | Freq: Once | INTRAVENOUS | Status: AC
  Administered 2019-06-15: 10 mg via INTRAVENOUS
  Filled 2019-06-15: qty 10

## 2019-06-15 MED ORDER — ALUM & MAG HYDROXIDE-SIMETH 200-200-20 MG/5ML PO SUSP
30.0000 mL | Freq: Once | ORAL | Status: AC
  Administered 2019-06-15: 30 mL via ORAL
  Filled 2019-06-15: qty 30

## 2019-06-15 MED ORDER — HEPARIN SOD (PORK) LOCK FLUSH 100 UNIT/ML IV SOLN
INTRAVENOUS | Status: AC
  Filled 2019-06-15: qty 5

## 2019-06-15 NOTE — Progress Notes (Signed)
PT HAVING SORES IN HIS MOUTH, HE USED BIOTENE MOUTHWASH, GOT THE mmw YEST. Feels like reflux also. Will add protonix for him

## 2019-06-16 ENCOUNTER — Other Ambulatory Visit: Payer: Self-pay

## 2019-06-16 ENCOUNTER — Ambulatory Visit
Admission: RE | Admit: 2019-06-16 | Discharge: 2019-06-16 | Disposition: A | Discharge status: Home / Self Care | Payer: Medicare HMO | Source: Ambulatory Visit | Attending: Radiation Oncology | Admitting: Radiation Oncology

## 2019-06-16 DIAGNOSIS — C119 Malignant neoplasm of nasopharynx, unspecified: Secondary | ICD-10-CM | POA: Diagnosis not present

## 2019-06-16 DIAGNOSIS — Z510 Encounter for antineoplastic radiation therapy: Secondary | ICD-10-CM | POA: Diagnosis not present

## 2019-06-17 ENCOUNTER — Other Ambulatory Visit: Payer: Self-pay | Admitting: *Deleted

## 2019-06-17 ENCOUNTER — Other Ambulatory Visit: Payer: Self-pay

## 2019-06-17 ENCOUNTER — Ambulatory Visit
Admission: RE | Admit: 2019-06-17 | Discharge: 2019-06-17 | Disposition: A | Discharge status: Home / Self Care | Payer: Medicare HMO | Source: Ambulatory Visit | Attending: Radiation Oncology | Admitting: Radiation Oncology

## 2019-06-17 ENCOUNTER — Encounter: Payer: Self-pay | Admitting: Oncology

## 2019-06-17 DIAGNOSIS — C119 Malignant neoplasm of nasopharynx, unspecified: Secondary | ICD-10-CM | POA: Diagnosis not present

## 2019-06-17 DIAGNOSIS — Z510 Encounter for antineoplastic radiation therapy: Secondary | ICD-10-CM | POA: Diagnosis not present

## 2019-06-17 MED ORDER — SUCRALFATE 1 G PO TABS: 1.0000 g | ORAL_TABLET | Freq: Three times a day (TID) | ORAL | 6 refills | Status: DC

## 2019-06-17 MED ORDER — FLUCONAZOLE 100 MG PO TABS: 100.0000 mg | ORAL_TABLET | Freq: Every day | ORAL | 0 refills | Status: DC

## 2019-06-17 NOTE — Progress Notes (Signed)
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 Pryor Ochoa for symptoms of ear pain and discharge. Patient also noticed associated hearing loss and tinnitus. Patient was noted to have erythema and edema in the left sphenopalatine fossa as well as an exophytic mass on NPL exam. CT soft tissue of the neck showed asymmetric soft tissue within the left nasopharynx measuring 2.4 x 2 x 3.2 cm. There is lateral bulging with encroachment upon the left parapharyngeal space without frank infiltration. No extension into oropharynx or nasal cavity. No skull base infiltration. No appreciable mass within the larynx. No pathologically enlarged cervical lymph nodes. 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 Negative for chills, fever and weight loss.  HENT: Negative for congestion, ear discharge and nosebleeds.   Eyes: Negative for blurred vision.  Respiratory: Negative for cough, hemoptysis, sputum production, shortness of breath and wheezing.   Cardiovascular:  Negative for chest pain, palpitations, orthopnea and claudication.  Gastrointestinal: Positive for heartburn. Negative for abdominal pain, blood in stool, constipation, diarrhea, melena, nausea and vomiting.       Pain during swallowing  Genitourinary: Negative for dysuria, flank pain, frequency, hematuria and urgency.  Musculoskeletal: Negative for back pain, joint pain and myalgias.  Skin: Negative for rash.  Neurological: Negative for dizziness, tingling, focal weakness, seizures, weakness and headaches.  Endo/Heme/Allergies: Does not bruise/bleed easily.  Psychiatric/Behavioral: Negative for depression and suicidal ideas. The patient is nervous/anxious. The patient does not have insomnia.       Allergies  Allergen Reactions  . Opana [Oxymorphone Hcl]     Made him BLACKOUT  . Amoxicillin Other (See Comments)  . Oxymorphone Other (See Comments)  . Sulfa Antibiotics Other (See Comments)  . Sulfur     Childhood reaction      Past Medical History:  Diagnosis Date  . Alcohol abuse   . Benzodiazepine dependence ([place])   . Benzodiazepine withdrawal ([place])   . Chronic pain in right foot   . COPD (chronic obstructive pulmonary disease) ([place])   . Depression 08/19/2017  . Hepatitis C   . Hypertension   . 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[place])   . Seizures ([place]) 2010  . Sleep apnea   . Syncope    questionable vasovagal     Past Surgical History:  Procedure Laterality Date  . COLONOSCOPY WITH PROPOFOL N/A 12/16/2016   Procedure: COLONOSCOPY WITH PROPOFOL;  Surgeon: Lollie Sails, MD;  Location: [facility] ENDOSCOPY;  Service: Endoscopy;  Laterality: N/A;  . COLONOSCOPY WITH PROPOFOL N/A 03/14/2017   Procedure: COLONOSCOPY WITH PROPOFOL;  Surgeon: Lollie Sails, MD;  Location: [facility] ENDOSCOPY;  Service: Endoscopy;  Laterality:  N/A;  . FOOT SURGERY Right 03/12/2002  . HEMORRHOID SURGERY    . LITHOTRIPSY    . MYRINGOTOMY WITH TUBE PLACEMENT Left 04/28/2019   Procedure: MYRINGOTOMY WITH TUBE  PLACEMENT;  Surgeon: Carloyn Manner, MD;  Location: [place];  Service: ENT;  Laterality: Left;  . NASOPHARYNGOSCOPY N/A 04/28/2019   Procedure: NASOPHARYNGOSCOPY WITH BIOPSY;  Surgeon: Carloyn Manner, MD;  Location: [place];  Service: ENT;  Laterality: N/A;  . PORTA CATH INSERTION N/A 05/20/2019   Procedure: PORTA CATH INSERTION;  Surgeon: Algernon Huxley, MD;  Location: [place] CV LAB;  Service: Cardiovascular;  Laterality: N/A;  . TONSILLECTOMY      Social History   Socioeconomic History  . Marital status: Divorced    Spouse name: Not on file  . Number of children: Not on file  . Years of education: Not on file  . Highest education level: Not on file  Occupational History  . Not on file  Tobacco Use  . Smoking status: Former Smoker    Packs/day: 0.50    Years: 50.00    Pack years: 25.00    Types: Cigarettes    Quit date: 02/05/2019    Years since quitting: 0.3  . Smokeless tobacco: Never Used  Substance and Sexual Activity  . Alcohol use: Yes    Alcohol/week: 7.0 standard drinks    Types: 7 Standard drinks or equivalent per week    Comment: rarely  . Drug use: Not Currently  . Sexual activity: Not Currently  Other Topics Concern  . Not on file  Social History Narrative  . Not on file   Social Determinants of Health   Financial Resource Strain:   . Difficulty of Paying Living Expenses: Not on file  Food Insecurity: No Food Insecurity  . Worried About Charity fundraiser in the Last Year: Never true  . Ran Out of Food in the Last Year: Never true  Transportation Needs: No Transportation Needs  . Lack of Transportation (Medical): No  . Lack of Transportation (Non-Medical): No  Physical Activity: Inactive  . Days of Exercise per Week: 0 days  . Minutes of Exercise per Session: 0 min  Stress:   . Feeling of Stress : Not on file  Social Connections:   . Frequency of Communication with Friends and Family: Not on file  . Frequency of  Social Gatherings with Friends and Family: Not on file  . Attends Religious Services: Not on file  . Active Member of Clubs or Organizations: Not on file  . Attends Archivist Meetings: Not on file  . Marital Status: Not on file  Intimate Partner Violence: Not At Risk  . Fear of Current or Ex-Partner: No  . Emotionally Abused: No  . Physically Abused: No  . Sexually Abused: No    Family History  Problem Relation Age of Onset  . Arthritis Mother   . Hypertension Mother   . Cancer Father   . Kidney disease Father   . Alcohol abuse Paternal Aunt   . Depression Maternal Grandfather      Current Outpatient Medications:  .  albuterol (VENTOLIN HFA) 108 (90 Base) MCG/ACT inhaler, Inhale 2 puffs into the lungs every 6 (six) hours as needed for wheezing or shortness of breath., Disp: 18 g, Rfl: 3 .  ALPRAZolam (XANAX) 0.5 MG tablet, Take 1 tablet (0.5 mg total) by mouth every 6 (six) hours as needed for anxiety., Disp: 60 tablet, Rfl: 0 .  amLODipine (  NORVASC) 5 MG tablet, Take 1 tablet (5 mg total) by mouth daily., Disp: 90 tablet, Rfl: 3 .  antiseptic oral rinse (BIOTENE) LIQD, 15 mLs by Mouth Rinse route as needed for dry mouth., Disp: , Rfl:  .  aspirin EC 81 MG tablet, Take 81 mg by mouth daily., Disp: , Rfl:  .  cyclobenzaprine (FLEXERIL) 5 MG tablet, Take 2 tablets (10 mg total) by mouth 2 (two) times daily as needed for muscle spasms., Disp: 45 tablet, Rfl: 1 .  dexamethasone (DECADRON) 4 MG tablet, Take 2 tablets by mouth once a day on the day after chemotherapy and then take 2 tablets two times a day for 2 days. Take with food., Disp: 30 tablet, Rfl: 1 .  gabapentin (NEURONTIN) 100 MG capsule, Take 2 capsules (200 mg total) by mouth 3 (three) times daily., Disp: 180 capsule, Rfl: 3 .  hydrOXYzine (ATARAX/VISTARIL) 25 MG tablet, Take 1 to 2 tablets po QHS prn insomnia/anxiety, Disp: 60 tablet, Rfl: 2 .  lidocaine-prilocaine (EMLA) cream, Apply to affected area once, Disp:  30 g, Rfl: 3 .  magic mouthwash w/lidocaine SOLN, Take 5 mLs by mouth 4 (four) times daily as needed for mouth pain., Disp: 240 mL, Rfl: 3 .  MAGNESIUM PO, Take by mouth., Disp: , Rfl:  .  Multiple Vitamin (MULTIVITAMIN WITH MINERALS) TABS tablet, Take 1 tablet by mouth daily., Disp: , Rfl:  .  ondansetron (ZOFRAN) 4 MG tablet, Take 1 tablet (4 mg total) by mouth every 8 (eight) hours as needed for up to 10 doses for nausea or vomiting., Disp: 20 tablet, Rfl: 0 .  ondansetron (ZOFRAN) 8 MG tablet, Take 1 tablet (8 mg total) by mouth 2 (two) times daily as needed. Start on the third day after chemotherapy., Disp: 30 tablet, Rfl: 1 .  prochlorperazine (COMPAZINE) 10 MG tablet, Take 1 tablet (10 mg total) by mouth every 6 (six) hours as needed (Nausea or vomiting)., Disp: 30 tablet, Rfl: 1 .  fluconazole (DIFLUCAN) 100 MG tablet, Take 1 tablet (100 mg total) by mouth daily., Disp: 7 tablet, Rfl: 0 .  pantoprazole (PROTONIX) 20 MG tablet, Take 1 tablet (20 mg total) by mouth daily., Disp: 30 tablet, Rfl: 1 .  sucralfate (CARAFATE) 1 g tablet, Take 1 tablet (1 g total) by mouth 3 (three) times daily before meals. Dissolve in4to 5 tbs warm water, swish &swallow, Disp: 90 tablet, Rfl: 6  Physical exam:  Vitals:   06/15/19 0848  BP: 125/68  Pulse: 72  Resp: 16  Temp: (!) 95.7 F (35.4 C)  TempSrc: Tympanic  Weight: 220 lb (99.8 kg)   Physical Exam Constitutional:      General: He is not in acute distress. HENT:     Head: Normocephalic and atraumatic.     Mouth/Throat:     Mouth: Mucous membranes are moist.     Pharynx: Oropharynx is clear.  Eyes:     Pupils: Pupils are equal, round, and reactive to light.  Cardiovascular:     Rate and Rhythm: Normal rate and regular rhythm.     Heart sounds: Normal heart sounds.  Pulmonary:     Effort: Pulmonary effort is normal.     Breath sounds: Normal breath sounds.  Abdominal:     General: Bowel sounds are normal.     Palpations: Abdomen is soft.   Musculoskeletal:     Cervical back: Normal range of motion.  Skin:    General: Skin is warm and dry.  Neurological:  Mental Status: He is alert and oriented to person, place, and time.      CMP Latest Ref Rng & Units 06/15/2019  Glucose 70 - 99 mg/dL 118(H)  BUN 8 - 23 mg/dL 25(H)  Creatinine 0.61 - 1.24 mg/dL 1.19  Sodium 135 - 145 mmol/L 136  Potassium 3.5 - 5.1 mmol/L 3.7  Chloride 98 - 111 mmol/L 100  CO2 22 - 32 mmol/L 28  Calcium 8.9 - 10.3 mg/dL 8.8(L)  Total Protein 6.5 - 8.1 g/dL -  Total Bilirubin 0.3 - 1.2 mg/dL -  Alkaline Phos 38 - 126 U/L -  AST 15 - 41 U/L -  ALT 0 - 44 U/L -   CBC Latest Ref Rng & Units 06/15/2019  WBC 4.0 - 10.5 K/uL 11.6(H)  Hemoglobin 13.0 - 17.0 g/dL 14.9  Hematocrit 39.0 - 52.0 % 44.4  Platelets 150 - 400 K/uL 170    No images are attached to the encounter.  MR Brain W Wo Contrast  Result Date: 05/25/2019 CLINICAL DATA:  New nasopharyngeal carcinoma. EXAM: MRI HEAD WITHOUT AND WITH CONTRAST TECHNIQUE: Multiplanar, multiecho pulse sequences of the brain and surrounding structures were obtained without and with intravenous contrast. CONTRAST:  18mL GADAVIST GADOBUTROL 1 MMOL/ML IV SOLN COMPARISON:  MRI head 82,015 FINDINGS: Brain: Mild cortical atrophy. Negative for hydrocephalus. Scattered small white matter hyperintensities consistent with chronic microvascular ischemia. Negative for acute infarct or hemorrhage. No intracranial mass lesion. Vascular: Normal arterial flow voids. Skull and upper cervical spine: No skeletal lesion identified. Sinuses/Orbits: Enhancing mass lesion in the left nasopharynx measuring approximately 12 x 18 x 30 mm. This is most compatible with primary carcinoma of the nasopharynx on the left. No definite perineural spread of tumor although postcontrast fat suppression imaging not obtained. Mucosal edema paranasal sinuses. Left mastoid effusion. Normal orbit Other: None IMPRESSION: 1. Atrophy and chronic microvascular  ischemic change in the white matter. No acute intracranial abnormality 2. Enhancing solid mass left nasopharynx compatible with carcinoma. No definite perineural spread however fat suppressed postcontrast imaging not obtained Electronically Signed   By: Franchot Gallo M.D.   On: 05/25/2019 14:03   PERIPHERAL VASCULAR CATHETERIZATION  Result Date: 05/20/2019 See op note  NM PET Image Initial (PI) Skull Base To Thigh  Result Date: 05/28/2019 CLINICAL DATA:  Initial treatment strategy for nasopharyngeal carcinoma. Status post biopsy 04/28/2019. Port-A-Cath placement. Alcohol abuse. Hepatitis C. Smoker. EXAM: NUCLEAR MEDICINE PET SKULL BASE TO THIGH TECHNIQUE: 11.3 mCi F-18 FDG was injected intravenously. Full-ring PET imaging was performed from the skull base to thigh after the radiotracer. CT data was obtained and used for attenuation correction and anatomic localization. Fasting blood glucose: 130 mg/dl COMPARISON:  04/21/2019 neck CT. Chest CT from 01/11/2006 is reviewed. FINDINGS: Mediastinal blood pool activity: SUV max 2.4 Liver activity: SUV max NA NECK: Left nasopharyngeal primary measures on the order of 2.6 x 2.3 cm and a S.U.V. max of 17.8 on 19/3. No cervical nodal hypermetabolism. Incidental CT findings: No cervical adenopathy. Bilateral carotid atherosclerosis. CHEST: No pulmonary parenchymal or thoracic nodal hypermetabolism. Incidental CT findings: Right Port-A-Cath tip at low SVC. Aortic atherosclerosis. No thoracic adenopathy. Favor extrapleural fat prominence at the anterior right lung base measuring 5 mm on 129/3. Mild centrilobular emphysema. ABDOMEN/PELVIS: Prominent porta hepatis nodes with hypermetabolism. Example 1.2 cm and a S.U.V. max of 3.4 on 157/3. Incidental CT findings: Caudate prominence and suggestion of medial segment left liver lobe atrophy. Stone within the gallbladder neck measures 1.9 cm. Normal adrenal glands.  Abdominal aortic atherosclerosis. SKELETON: No abnormal marrow  activity. Incidental CT findings: none IMPRESSION: 1. Left nasopharyngeal primary, without cervical nodal metastasis. 2. Hepatic morphology for which mild cirrhosis cannot be excluded, especially given history of alcohol abuse and hepatitis C. Prominent porta hepatis nodes with hypermetabolism. Favored to be reactive and related to hepatic disease 3. Incidental findings, including: Aortic atherosclerosis (ICD10-I70.0) and emphysema (ICD10-J43.9). Cholelithiasis. Electronically Signed   By: Abigail Miyamoto M.D.   On: 05/28/2019 11:30     Assessment and plan- Patient is a 69 y.o. male with history of nasopharyngeal carcinoma stage II T2 N0 M0. He is here for on treatment assessment prior to cycle 2 of weekly cisplatin chemotherapy  Counts ok to proceed with cycle 2 of weekly cisplatin chemotherapy today. He will directly proceed for cycle 3 next week and I will see him in 2 weeks for cycle 4  Magic mouthwash for mouth soreness. No overt mucositis noted today. Coated tongue but not thrush  protonix for heartburn  Magic mouthwash for mouth soreness     Visit Diagnosis 1. Encounter for antineoplastic chemotherapy   2. Nasopharyngeal carcinoma (HCC)   3. Heartburn      Dr. Randa Evens, MD, MPH [facility] at [facility] [id number] 06/17/2019 1:08 PM

## 2019-06-18 ENCOUNTER — Other Ambulatory Visit: Payer: Self-pay

## 2019-06-18 ENCOUNTER — Ambulatory Visit
Admission: RE | Admit: 2019-06-18 | Discharge: 2019-06-18 | Disposition: A | Discharge status: Home / Self Care | Payer: Medicare HMO | Source: Ambulatory Visit | Attending: Radiation Oncology | Admitting: Radiation Oncology

## 2019-06-18 DIAGNOSIS — C119 Malignant neoplasm of nasopharynx, unspecified: Secondary | ICD-10-CM | POA: Diagnosis not present

## 2019-06-18 DIAGNOSIS — Z510 Encounter for antineoplastic radiation therapy: Secondary | ICD-10-CM | POA: Diagnosis not present

## 2019-06-21 ENCOUNTER — Other Ambulatory Visit: Payer: Self-pay

## 2019-06-21 ENCOUNTER — Inpatient Hospital Stay: Payer: Medicare HMO

## 2019-06-21 ENCOUNTER — Ambulatory Visit
Admission: RE | Admit: 2019-06-21 | Discharge: 2019-06-21 | Disposition: A | Discharge status: Home / Self Care | Payer: Medicare HMO | Source: Ambulatory Visit | Attending: Radiation Oncology | Admitting: Radiation Oncology

## 2019-06-21 DIAGNOSIS — C119 Malignant neoplasm of nasopharynx, unspecified: Secondary | ICD-10-CM | POA: Diagnosis not present

## 2019-06-21 DIAGNOSIS — Z510 Encounter for antineoplastic radiation therapy: Secondary | ICD-10-CM | POA: Diagnosis not present

## 2019-06-21 NOTE — Progress Notes (Signed)
Nutrition Follow-up:  Patient with newly diagnosed nasopharyngeal carcinoma.  Patient receiving concurrent chemotherapy and radiation therapy.    Met with patient following radiation. Patient reports pain on swallowing, dry mouth (before treatment) and mouth sores (no thrush).  Reports that he has been making shakes using raw eggs, whey protein powder, milk, fruit 2-4 a day. Has also been drinking boost shakes.  Plans on making soups/stews and eating pudding and ice cream.     Medications: carfate, diflucan, protonix  Labs: reviewed  Anthropometrics:   Weight 224 lb 9 oz noted on 2/3 via [facility] 220 lb on 2/2 in med onc clinic.   220 on 05/20/2019.  Patient weight 215-220 lb since June 2020   NUTRITION DIAGNOSIS: Predicted suboptimal energy intake continues with treatment   INTERVENTION:  Encouraged patient not to use raw eggs in shakes due to salmonella and weakened immune system from chemotherapy.  Reviewed soft, moist foods for patient to consume.   Patient has contact information    MONITORING, EVALUATION, GOAL: Patient will consume adequate calories and protein to prevent weight loss during treatment   NEXT VISIT: Monday, Feb 15 after raduation   B. , RD, LDN Registered Dietitian [phone] (pager)     

## 2019-06-22 ENCOUNTER — Other Ambulatory Visit: Payer: Self-pay | Admitting: Oncology

## 2019-06-22 ENCOUNTER — Other Ambulatory Visit: Payer: Self-pay

## 2019-06-22 ENCOUNTER — Ambulatory Visit
Admission: RE | Admit: 2019-06-22 | Discharge: 2019-06-22 | Disposition: A | Discharge status: Home / Self Care | Payer: Medicare HMO | Source: Ambulatory Visit | Attending: Radiation Oncology | Admitting: Radiation Oncology

## 2019-06-22 ENCOUNTER — Other Ambulatory Visit: Payer: Self-pay | Admitting: *Deleted

## 2019-06-22 ENCOUNTER — Inpatient Hospital Stay: Payer: Medicare HMO

## 2019-06-22 VITALS — BP 132/75 | HR 81 | Temp 97.0°F | Resp 19

## 2019-06-22 DIAGNOSIS — K1230 Oral mucositis (ulcerative), unspecified: Secondary | ICD-10-CM | POA: Diagnosis not present

## 2019-06-22 DIAGNOSIS — C119 Malignant neoplasm of nasopharynx, unspecified: Secondary | ICD-10-CM | POA: Diagnosis not present

## 2019-06-22 DIAGNOSIS — N179 Acute kidney failure, unspecified: Secondary | ICD-10-CM | POA: Diagnosis not present

## 2019-06-22 DIAGNOSIS — Z5111 Encounter for antineoplastic chemotherapy: Secondary | ICD-10-CM | POA: Diagnosis not present

## 2019-06-22 DIAGNOSIS — D696 Thrombocytopenia, unspecified: Secondary | ICD-10-CM | POA: Diagnosis not present

## 2019-06-22 DIAGNOSIS — K208 Other esophagitis without bleeding: Secondary | ICD-10-CM | POA: Diagnosis not present

## 2019-06-22 DIAGNOSIS — Z510 Encounter for antineoplastic radiation therapy: Secondary | ICD-10-CM | POA: Diagnosis not present

## 2019-06-22 DIAGNOSIS — R12 Heartburn: Secondary | ICD-10-CM | POA: Diagnosis not present

## 2019-06-22 DIAGNOSIS — G893 Neoplasm related pain (acute) (chronic): Secondary | ICD-10-CM

## 2019-06-22 DIAGNOSIS — I10 Essential (primary) hypertension: Secondary | ICD-10-CM | POA: Diagnosis not present

## 2019-06-22 DIAGNOSIS — Y842 Radiological procedure and radiotherapy as the cause of abnormal reaction of the patient, or of later complication, without mention of misadventure at the time of the procedure: Secondary | ICD-10-CM | POA: Diagnosis not present

## 2019-06-22 DIAGNOSIS — J029 Acute pharyngitis, unspecified: Secondary | ICD-10-CM | POA: Diagnosis not present

## 2019-06-22 LAB — CBC WITH DIFFERENTIAL/PLATELET
Abs Immature Granulocytes: 0.05 10*3/uL (ref 0.00–0.07)
Basophils Absolute: 0 10*3/uL (ref 0.0–0.1)
Basophils Relative: 0 %
Eosinophils Absolute: 0.1 10*3/uL (ref 0.0–0.5)
Eosinophils Relative: 1 %
HCT: 39.2 % (ref 39.0–52.0)
Hemoglobin: 13 g/dL (ref 13.0–17.0)
Immature Granulocytes: 0 %
Lymphocytes Relative: 8 %
Lymphs Abs: 1.1 10*3/uL (ref 0.7–4.0)
MCH: 30 pg (ref 26.0–34.0)
MCHC: 33.2 g/dL (ref 30.0–36.0)
MCV: 90.5 fL (ref 80.0–100.0)
Monocytes Absolute: 1.1 10*3/uL — ABNORMAL HIGH (ref 0.1–1.0)
Monocytes Relative: 9 %
Neutro Abs: 10.9 10*3/uL — ABNORMAL HIGH (ref 1.7–7.7)
Neutrophils Relative %: 82 %
Platelets: 146 10*3/uL — ABNORMAL LOW (ref 150–400)
RBC: 4.33 MIL/uL (ref 4.22–5.81)
RDW: 12.8 % (ref 11.5–15.5)
WBC: 13.3 10*3/uL — ABNORMAL HIGH (ref 4.0–10.5)
nRBC: 0 % (ref 0.0–0.2)

## 2019-06-22 LAB — BASIC METABOLIC PANEL
Anion gap: 9 (ref 5–15)
BUN: 17 mg/dL (ref 8–23)
CO2: 26 mmol/L (ref 22–32)
Calcium: 8.6 mg/dL — ABNORMAL LOW (ref 8.9–10.3)
Chloride: 98 mmol/L (ref 98–111)
Creatinine, Ser: 1.12 mg/dL (ref 0.61–1.24)
GFR calc Af Amer: >60 mL/min (ref >60)
GFR calc non Af Amer: >60 mL/min (ref >60)
Glucose, Bld: 139 mg/dL — ABNORMAL HIGH (ref 70–99)
Potassium: 4.2 mmol/L (ref 3.5–5.1)
Sodium: 133 mmol/L — ABNORMAL LOW (ref 135–145)

## 2019-06-22 LAB — MAGNESIUM: Magnesium: 1.9 mg/dL (ref 1.7–2.4)

## 2019-06-22 MED ORDER — HEPARIN SOD (PORK) LOCK FLUSH 100 UNIT/ML IV SOLN
500.0000 [IU] | Freq: Once | INTRAVENOUS | Status: DC | PRN
  Filled 2019-06-22: qty 5

## 2019-06-22 MED ORDER — SODIUM CHLORIDE 0.9 % IV SOLN
Freq: Once | INTRAVENOUS | Status: AC
  Filled 2019-06-22: qty 250

## 2019-06-22 MED ORDER — SODIUM CHLORIDE 0.9 % IV SOLN
40.0000 mg/m2 | Freq: Once | INTRAVENOUS | Status: AC
  Administered 2019-06-22: 88 mg via INTRAVENOUS
  Filled 2019-06-22: qty 88

## 2019-06-22 MED ORDER — PALONOSETRON HCL INJECTION 0.25 MG/5ML
0.2500 mg | Freq: Once | INTRAVENOUS | Status: AC
  Administered 2019-06-22: 0.25 mg via INTRAVENOUS
  Filled 2019-06-22: qty 5

## 2019-06-22 MED ORDER — PROCHLORPERAZINE EDISYLATE 10 MG/2ML IJ SOLN
10.0000 mg | Freq: Once | INTRAMUSCULAR | Status: AC
  Administered 2019-06-22: 09:00:00 10 mg via INTRAVENOUS
  Filled 2019-06-22: qty 2

## 2019-06-22 MED ORDER — OXYCODONE HCL 5 MG PO TABS
5.0000 mg | ORAL_TABLET | Freq: Once | ORAL | Status: AC
  Administered 2019-06-22: 5 mg via ORAL
  Filled 2019-06-22: qty 1

## 2019-06-22 MED ORDER — SODIUM CHLORIDE 0.9 % IV SOLN
150.0000 mg | Freq: Once | INTRAVENOUS | Status: AC
  Administered 2019-06-22: 150 mg via INTRAVENOUS
  Filled 2019-06-22: qty 5

## 2019-06-22 MED ORDER — SODIUM CHLORIDE 0.9 % IV SOLN
10.0000 mg | Freq: Once | INTRAVENOUS | Status: AC
  Administered 2019-06-22: 12:00:00 10 mg via INTRAVENOUS
  Filled 2019-06-22: qty 1

## 2019-06-22 MED ORDER — HEPARIN SOD (PORK) LOCK FLUSH 100 UNIT/ML IV SOLN
INTRAVENOUS | Status: AC
  Filled 2019-06-22: qty 5

## 2019-06-22 MED ORDER — POTASSIUM CHLORIDE 2 MEQ/ML IV SOLN
Freq: Once | INTRAVENOUS | Status: AC
  Filled 2019-06-22: qty 1000

## 2019-06-22 MED ORDER — HEPARIN SOD (PORK) LOCK FLUSH 100 UNIT/ML IV SOLN
500.0000 [IU] | Freq: Once | INTRAVENOUS | Status: AC
  Administered 2019-06-22: 15:00:00 500 [IU] via INTRAVENOUS
  Filled 2019-06-22: qty 5

## 2019-06-22 MED ORDER — OXYCODONE HCL 5 MG PO TABS: 5.0000 mg | ORAL_TABLET | ORAL | 0 refills | Status: DC | PRN

## 2019-06-22 MED ORDER — FAMOTIDINE IN NACL 20-0.9 MG/50ML-% IV SOLN
20.0000 mg | Freq: Once | INTRAVENOUS | Status: AC
  Administered 2019-06-22: 12:00:00 20 mg via INTRAVENOUS
  Filled 2019-06-22: qty 50

## 2019-06-22 MED ORDER — SODIUM CHLORIDE 0.9% FLUSH
10.0000 mL | Freq: Once | INTRAVENOUS | Status: AC
  Administered 2019-06-22: 08:00:00 10 mL via INTRAVENOUS
  Filled 2019-06-22: qty 10

## 2019-06-22 NOTE — Progress Notes (Signed)
Prior to treatment, pt stated that he was "really nauseated" MD and treatment team notified, verbal order to place compazine 10 mg IV once at this time prior to treatment. Pt updated and all questions answered at this time. VSS.   [name] [employer]

## 2019-06-23 ENCOUNTER — Ambulatory Visit
Admission: RE | Admit: 2019-06-23 | Discharge: 2019-06-23 | Disposition: A | Discharge status: Home / Self Care | Payer: Medicare HMO | Source: Ambulatory Visit | Attending: Radiation Oncology | Admitting: Radiation Oncology

## 2019-06-23 ENCOUNTER — Other Ambulatory Visit: Payer: Self-pay

## 2019-06-23 DIAGNOSIS — C119 Malignant neoplasm of nasopharynx, unspecified: Secondary | ICD-10-CM | POA: Diagnosis not present

## 2019-06-23 DIAGNOSIS — Z510 Encounter for antineoplastic radiation therapy: Secondary | ICD-10-CM | POA: Diagnosis not present

## 2019-06-24 ENCOUNTER — Other Ambulatory Visit: Payer: Self-pay

## 2019-06-24 ENCOUNTER — Ambulatory Visit
Admission: RE | Admit: 2019-06-24 | Discharge: 2019-06-24 | Disposition: A | Discharge status: Home / Self Care | Payer: Medicare HMO | Source: Ambulatory Visit | Attending: Radiation Oncology | Admitting: Radiation Oncology

## 2019-06-24 ENCOUNTER — Other Ambulatory Visit: Payer: Self-pay | Admitting: *Deleted

## 2019-06-24 DIAGNOSIS — Z510 Encounter for antineoplastic radiation therapy: Secondary | ICD-10-CM | POA: Diagnosis not present

## 2019-06-24 DIAGNOSIS — C119 Malignant neoplasm of nasopharynx, unspecified: Secondary | ICD-10-CM | POA: Diagnosis not present

## 2019-06-24 MED ORDER — DEXAMETHASONE 2 MG PO TABS: 2.0000 mg | ORAL_TABLET | Freq: Two times a day (BID) | ORAL | 1 refills | Status: DC

## 2019-06-25 ENCOUNTER — Telehealth: Payer: Self-pay

## 2019-06-25 ENCOUNTER — Ambulatory Visit
Admission: RE | Admit: 2019-06-25 | Discharge: 2019-06-25 | Disposition: A | Discharge status: Home / Self Care | Payer: Medicare HMO | Source: Ambulatory Visit | Attending: Radiation Oncology | Admitting: Radiation Oncology

## 2019-06-25 ENCOUNTER — Other Ambulatory Visit: Payer: Self-pay

## 2019-06-25 DIAGNOSIS — Z510 Encounter for antineoplastic radiation therapy: Secondary | ICD-10-CM | POA: Diagnosis not present

## 2019-06-25 DIAGNOSIS — C119 Malignant neoplasm of nasopharynx, unspecified: Secondary | ICD-10-CM | POA: Diagnosis not present

## 2019-06-25 NOTE — Telephone Encounter (Signed)
RECORD REQUEST COMPLETED AND RECORDS PLACED IN MAIL.

## 2019-06-27 ENCOUNTER — Ambulatory Visit: Payer: Medicare HMO

## 2019-06-28 ENCOUNTER — Inpatient Hospital Stay: Payer: Medicare HMO

## 2019-06-28 ENCOUNTER — Ambulatory Visit
Admission: RE | Admit: 2019-06-28 | Discharge: 2019-06-28 | Disposition: A | Discharge status: Home / Self Care | Payer: Medicare HMO | Source: Ambulatory Visit | Attending: Radiation Oncology | Admitting: Radiation Oncology

## 2019-06-28 ENCOUNTER — Other Ambulatory Visit: Payer: Self-pay

## 2019-06-28 DIAGNOSIS — C119 Malignant neoplasm of nasopharynx, unspecified: Secondary | ICD-10-CM | POA: Diagnosis not present

## 2019-06-28 DIAGNOSIS — Z510 Encounter for antineoplastic radiation therapy: Secondary | ICD-10-CM | POA: Diagnosis not present

## 2019-06-28 NOTE — Progress Notes (Addendum)
Nutrition Follow-up:  Patient with nasopharyngeal carcinoma.  Patient receiving concurrent chemotherapy and radiation therapy.   Met with patient prior to radiation per patient request.  Patient reports continued pain with swallowing and no taste.  Reports nausea last week was able to take nausea medications and symptoms improved.  Reports over the weekend was able to eat monkfish, scallops, barbecue, honeybun, applesauce, mashed potatoes.  Reports got sick after drinking protein shake on Tuesday so has not been drinking them as much this week.      Medications: carafate, decadron, MVI, zofran, compazine  Labs: reviewed  Anthropometrics:   Weight measured by RD in radiation today 217 lb 9 oz.    215-220 since June 2020.  Patient reports weight gain during pandemic.    NUTRITION DIAGNOSIS: Predicted suboptimal energy intake continues   INTERVENTION:  Encouraged choosing high calorie and protein foods.   Encouraged taking nausea medications. Patient would benefit from evaluation from [place], [place] to determine rehab needs, if any during treatment.    Patient has contact information    MONITORING, EVALUATION, GOAL: Patient will consume adequate calories and protein to prevent weight loss during treatment   NEXT VISIT: Monday, Feb 22 after radiation  Latrise Bowland B. Zenia Resides, [place], [place] Registered Dietitian [phone] (pager)

## 2019-06-29 ENCOUNTER — Other Ambulatory Visit: Payer: Self-pay

## 2019-06-29 ENCOUNTER — Encounter: Payer: Self-pay | Admitting: Oncology

## 2019-06-29 ENCOUNTER — Inpatient Hospital Stay (HOSPITAL_BASED_OUTPATIENT_CLINIC_OR_DEPARTMENT_OTHER): Payer: Medicare HMO | Admitting: Oncology

## 2019-06-29 ENCOUNTER — Ambulatory Visit
Admission: RE | Admit: 2019-06-29 | Discharge: 2019-06-29 | Disposition: A | Discharge status: Home / Self Care | Payer: Medicare HMO | Source: Ambulatory Visit | Attending: Radiation Oncology | Admitting: Radiation Oncology

## 2019-06-29 ENCOUNTER — Inpatient Hospital Stay: Payer: Medicare HMO

## 2019-06-29 VITALS — BP 136/71 | HR 75 | Temp 97.8°F | Wt 214.7 lb

## 2019-06-29 DIAGNOSIS — K1233 Oral mucositis (ulcerative) due to radiation: Secondary | ICD-10-CM

## 2019-06-29 DIAGNOSIS — Y842 Radiological procedure and radiotherapy as the cause of abnormal reaction of the patient, or of later complication, without mention of misadventure at the time of the procedure: Secondary | ICD-10-CM | POA: Diagnosis not present

## 2019-06-29 DIAGNOSIS — Z5111 Encounter for antineoplastic chemotherapy: Secondary | ICD-10-CM | POA: Diagnosis not present

## 2019-06-29 DIAGNOSIS — G893 Neoplasm related pain (acute) (chronic): Secondary | ICD-10-CM

## 2019-06-29 DIAGNOSIS — R7989 Other specified abnormal findings of blood chemistry: Secondary | ICD-10-CM

## 2019-06-29 DIAGNOSIS — R12 Heartburn: Secondary | ICD-10-CM | POA: Diagnosis not present

## 2019-06-29 DIAGNOSIS — K208 Other esophagitis without bleeding: Secondary | ICD-10-CM | POA: Diagnosis not present

## 2019-06-29 DIAGNOSIS — C119 Malignant neoplasm of nasopharynx, unspecified: Secondary | ICD-10-CM

## 2019-06-29 DIAGNOSIS — D696 Thrombocytopenia, unspecified: Secondary | ICD-10-CM | POA: Diagnosis not present

## 2019-06-29 DIAGNOSIS — I10 Essential (primary) hypertension: Secondary | ICD-10-CM | POA: Diagnosis not present

## 2019-06-29 DIAGNOSIS — Z510 Encounter for antineoplastic radiation therapy: Secondary | ICD-10-CM | POA: Diagnosis not present

## 2019-06-29 DIAGNOSIS — N179 Acute kidney failure, unspecified: Secondary | ICD-10-CM | POA: Diagnosis not present

## 2019-06-29 DIAGNOSIS — K1230 Oral mucositis (ulcerative), unspecified: Secondary | ICD-10-CM | POA: Diagnosis not present

## 2019-06-29 DIAGNOSIS — J029 Acute pharyngitis, unspecified: Secondary | ICD-10-CM | POA: Diagnosis not present

## 2019-06-29 LAB — CBC WITH DIFFERENTIAL/PLATELET
Abs Immature Granulocytes: 0.03 10*3/uL (ref 0.00–0.07)
Basophils Absolute: 0 10*3/uL (ref 0.0–0.1)
Basophils Relative: 0 %
Eosinophils Absolute: 0 10*3/uL (ref 0.0–0.5)
Eosinophils Relative: 0 %
HCT: 39.8 % (ref 39.0–52.0)
Hemoglobin: 13.2 g/dL (ref 13.0–17.0)
Immature Granulocytes: 0 %
Lymphocytes Relative: 20 %
Lymphs Abs: 1.5 10*3/uL (ref 0.7–4.0)
MCH: 29.8 pg (ref 26.0–34.0)
MCHC: 33.2 g/dL (ref 30.0–36.0)
MCV: 89.8 fL (ref 80.0–100.0)
Monocytes Absolute: 0.7 10*3/uL (ref 0.1–1.0)
Monocytes Relative: 9 %
Neutro Abs: 5.2 10*3/uL (ref 1.7–7.7)
Neutrophils Relative %: 71 %
Platelets: 142 10*3/uL — ABNORMAL LOW (ref 150–400)
RBC: 4.43 MIL/uL (ref 4.22–5.81)
RDW: 12.9 % (ref 11.5–15.5)
WBC: 7.4 10*3/uL (ref 4.0–10.5)
nRBC: 0 % (ref 0.0–0.2)

## 2019-06-29 LAB — BASIC METABOLIC PANEL
Anion gap: 11 (ref 5–15)
BUN: 19 mg/dL (ref 8–23)
CO2: 27 mmol/L (ref 22–32)
Calcium: 8.5 mg/dL — ABNORMAL LOW (ref 8.9–10.3)
Chloride: 100 mmol/L (ref 98–111)
Creatinine, Ser: 1.28 mg/dL — ABNORMAL HIGH (ref 0.61–1.24)
GFR calc Af Amer: >60 mL/min (ref >60)
GFR calc non Af Amer: 58 mL/min — ABNORMAL LOW (ref >60)
Glucose, Bld: 128 mg/dL — ABNORMAL HIGH (ref 70–99)
Potassium: 3.6 mmol/L (ref 3.5–5.1)
Sodium: 138 mmol/L (ref 135–145)

## 2019-06-29 LAB — MAGNESIUM: Magnesium: 1.8 mg/dL (ref 1.7–2.4)

## 2019-06-29 MED ORDER — SODIUM CHLORIDE 0.9 % IV SOLN
Freq: Once | INTRAVENOUS | Status: AC
  Filled 2019-06-29: qty 250

## 2019-06-29 MED ORDER — SODIUM CHLORIDE 0.9% FLUSH
10.0000 mL | Freq: Once | INTRAVENOUS | Status: AC
  Administered 2019-06-29: 10 mL via INTRAVENOUS
  Filled 2019-06-29: qty 10

## 2019-06-29 MED ORDER — HEPARIN SOD (PORK) LOCK FLUSH 100 UNIT/ML IV SOLN
500.0000 [IU] | Freq: Once | INTRAVENOUS | Status: AC | PRN
  Administered 2019-06-29: 500 [IU]
  Filled 2019-06-29: qty 5

## 2019-06-29 MED ORDER — SODIUM CHLORIDE 0.9 % IV SOLN
10.0000 mg | Freq: Once | INTRAVENOUS | Status: AC
  Administered 2019-06-29: 12:00:00 10 mg via INTRAVENOUS
  Filled 2019-06-29: qty 1

## 2019-06-29 MED ORDER — SODIUM CHLORIDE 0.9 % IV SOLN
40.0000 mg/m2 | Freq: Once | INTRAVENOUS | Status: AC
  Administered 2019-06-29: 13:00:00 88 mg via INTRAVENOUS
  Filled 2019-06-29: qty 88

## 2019-06-29 MED ORDER — POTASSIUM CHLORIDE 2 MEQ/ML IV SOLN
Freq: Once | INTRAVENOUS | Status: AC
  Filled 2019-06-29: qty 1000

## 2019-06-29 MED ORDER — PALONOSETRON HCL INJECTION 0.25 MG/5ML
0.2500 mg | Freq: Once | INTRAVENOUS | Status: AC
  Administered 2019-06-29: 0.25 mg via INTRAVENOUS
  Filled 2019-06-29: qty 5

## 2019-06-29 MED ORDER — FAMOTIDINE IN NACL 20-0.9 MG/50ML-% IV SOLN
20.0000 mg | Freq: Once | INTRAVENOUS | Status: AC
  Administered 2019-06-29: 20 mg via INTRAVENOUS
  Filled 2019-06-29: qty 50

## 2019-06-29 MED ORDER — OXYCODONE HCL 5 MG PO TABS
10.0000 mg | ORAL_TABLET | Freq: Once | ORAL | Status: AC
  Administered 2019-06-29: 09:00:00 10 mg via ORAL
  Filled 2019-06-29: qty 2

## 2019-06-29 MED ORDER — SODIUM CHLORIDE 0.9 % IV SOLN
150.0000 mg | Freq: Once | INTRAVENOUS | Status: AC
  Administered 2019-06-29: 150 mg via INTRAVENOUS
  Filled 2019-06-29: qty 150

## 2019-06-30 ENCOUNTER — Other Ambulatory Visit: Payer: Self-pay

## 2019-06-30 ENCOUNTER — Ambulatory Visit
Admission: RE | Admit: 2019-06-30 | Discharge: 2019-06-30 | Disposition: A | Discharge status: Home / Self Care | Payer: Medicare HMO | Source: Ambulatory Visit | Attending: Radiation Oncology | Admitting: Radiation Oncology

## 2019-06-30 DIAGNOSIS — Z510 Encounter for antineoplastic radiation therapy: Secondary | ICD-10-CM | POA: Diagnosis not present

## 2019-06-30 DIAGNOSIS — C119 Malignant neoplasm of nasopharynx, unspecified: Secondary | ICD-10-CM | POA: Diagnosis not present

## 2019-07-01 ENCOUNTER — Ambulatory Visit
Admission: RE | Admit: 2019-07-01 | Discharge: 2019-07-01 | Disposition: A | Discharge status: Home / Self Care | Payer: Medicare HMO | Source: Ambulatory Visit | Attending: Radiation Oncology | Admitting: Radiation Oncology

## 2019-07-01 ENCOUNTER — Other Ambulatory Visit: Payer: Self-pay | Admitting: *Deleted

## 2019-07-01 ENCOUNTER — Encounter: Payer: Self-pay | Admitting: Oncology

## 2019-07-01 ENCOUNTER — Other Ambulatory Visit: Payer: Self-pay

## 2019-07-01 DIAGNOSIS — C119 Malignant neoplasm of nasopharynx, unspecified: Secondary | ICD-10-CM | POA: Diagnosis not present

## 2019-07-01 DIAGNOSIS — Z510 Encounter for antineoplastic radiation therapy: Secondary | ICD-10-CM | POA: Diagnosis not present

## 2019-07-01 MED ORDER — FENTANYL 12 MCG/HR TD PT72: 1.0000 | MEDICATED_PATCH | TRANSDERMAL | 0 refills | Status: DC

## 2019-07-01 MED ORDER — OXYCODONE HCL 10 MG PO TABS: 10.0000 mg | ORAL_TABLET | ORAL | 0 refills | Status: DC | PRN

## 2019-07-01 NOTE — Progress Notes (Signed)
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 Pryor Ochoa for symptoms of ear pain and discharge. Patient also noticed associated hearing loss and tinnitus. Patient was noted to have erythema and edema in the left sphenopalatine fossa as well as an exophytic mass on NPL exam. CT soft tissue of the neck showed asymmetric soft tissue within the left nasopharynx measuring 2.4 x 2 x 3.2 cm. There is lateral bulging with encroachment upon the left parapharyngeal space without frank infiltration. No extension into oropharynx or nasal cavity. No skull base infiltration. No appreciable mass within the larynx. No pathologically enlarged cervical lymph nodes. 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 Negative for chills, fever and weight loss.  HENT: Negative for congestion, ear discharge and nosebleeds.        Throat pain  Eyes: Negative for blurred vision.  Respiratory: Negative for cough, hemoptysis, sputum production,  shortness of breath and wheezing.   Cardiovascular: Negative for chest pain, palpitations, orthopnea and claudication.  Gastrointestinal: Negative for abdominal pain, blood in stool, constipation, diarrhea, heartburn, melena, nausea and vomiting.  Genitourinary: Negative for dysuria, flank pain, frequency, hematuria and urgency.  Musculoskeletal: Negative for back pain, joint pain and myalgias.  Skin: Negative for rash.  Neurological: Negative for dizziness, tingling, focal weakness, seizures, weakness and headaches.  Endo/Heme/Allergies: Does not bruise/bleed easily.  Psychiatric/Behavioral: Negative for depression and suicidal ideas. The patient does not have insomnia.       Allergies  Allergen Reactions  . Opana [Oxymorphone Hcl]     Made him BLACKOUT  . Amoxicillin Other (See Comments)  . Oxymorphone Other (See Comments)  . Sulfa Antibiotics Other (See Comments)  . Sulfur     Childhood reaction      Past Medical History:  Diagnosis Date  . Alcohol abuse   . Benzodiazepine dependence ([place])   . Benzodiazepine withdrawal ([place])   . Chronic pain in right foot   . COPD (chronic obstructive pulmonary disease) ([place])   . Depression 08/19/2017  . Hepatitis C   . Hypertension   . 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[place])   . Seizures ([place]) 2010  . Sleep apnea   . Syncope    questionable vasovagal     Past Surgical History:  Procedure Laterality Date  . COLONOSCOPY WITH PROPOFOL N/A 12/16/2016   Procedure: COLONOSCOPY WITH PROPOFOL;  Surgeon: Lollie Sails, MD;  Location: [facility] ENDOSCOPY;  Service: Endoscopy;  Laterality: N/A;  . COLONOSCOPY WITH PROPOFOL N/A 03/14/2017   Procedure: COLONOSCOPY WITH PROPOFOL;  Surgeon: Lollie Sails, MD;  Location: [facility] ENDOSCOPY;  Service: Endoscopy;  Laterality:  N/A;  . FOOT SURGERY Right 03/12/2002  . HEMORRHOID SURGERY    . LITHOTRIPSY    . MYRINGOTOMY WITH TUBE PLACEMENT Left 04/28/2019   Procedure: MYRINGOTOMY WITH TUBE PLACEMENT;  Surgeon:  Carloyn Manner, MD;  Location: [place];  Service: ENT;  Laterality: Left;  . NASOPHARYNGOSCOPY N/A 04/28/2019   Procedure: NASOPHARYNGOSCOPY WITH BIOPSY;  Surgeon: Carloyn Manner, MD;  Location: [place];  Service: ENT;  Laterality: N/A;  . PORTA CATH INSERTION N/A 05/20/2019   Procedure: PORTA CATH INSERTION;  Surgeon: Algernon Huxley, MD;  Location: [place] CV LAB;  Service: Cardiovascular;  Laterality: N/A;  . TONSILLECTOMY      Social History   Socioeconomic History  . Marital status: Divorced    Spouse name: Not on file  . Number of children: Not on file  . Years of education: Not on file  . Highest education level: Not on file  Occupational History  . Not on file  Tobacco Use  . Smoking status: Former Smoker    Packs/day: 0.50    Years: 50.00    Pack years: 25.00    Types: Cigarettes    Quit date: 02/05/2019    Years since quitting: 0.4  . Smokeless tobacco: Never Used  Substance and Sexual Activity  . Alcohol use: Yes    Alcohol/week: 7.0 standard drinks    Types: 7 Standard drinks or equivalent per week    Comment: rarely  . Drug use: Not Currently  . Sexual activity: Not Currently  Other Topics Concern  . Not on file  Social History Narrative  . Not on file   Social Determinants of Health   Financial Resource Strain:   . Difficulty of Paying Living Expenses: Not on file  Food Insecurity: No Food Insecurity  . Worried About Charity fundraiser in the Last Year: Never true  . Ran Out of Food in the Last Year: Never true  Transportation Needs: No Transportation Needs  . Lack of Transportation (Medical): No  . Lack of Transportation (Non-Medical): No  Physical Activity: Inactive  . Days of Exercise per Week: 0 days  . Minutes of Exercise per Session: 0 min  Stress:   . Feeling of Stress : Not on file  Social Connections:   . Frequency of Communication with Friends and Family: Not on file  . Frequency of Social Gatherings with  Friends and Family: Not on file  . Attends Religious Services: Not on file  . Active Member of Clubs or Organizations: Not on file  . Attends Archivist Meetings: Not on file  . Marital Status: Not on file  Intimate Partner Violence: Not At Risk  . Fear of Current or Ex-Partner: No  . Emotionally Abused: No  . Physically Abused: No  . Sexually Abused: No    Family History  Problem Relation Age of Onset  . Arthritis Mother   . Hypertension Mother   . Cancer Father   . Kidney disease Father   . Alcohol abuse Paternal Aunt   . Depression Maternal Grandfather      Current Outpatient Medications:  .  albuterol (VENTOLIN HFA) 108 (90 Base) MCG/ACT inhaler, Inhale 2 puffs into the lungs every 6 (six) hours as needed for wheezing or shortness of breath., Disp: 18 g, Rfl: 3 .  ALPRAZolam (XANAX) 0.5 MG tablet, Take 1 tablet (0.5 mg total) by mouth every 6 (six) hours as needed for anxiety., Disp: 60 tablet, Rfl: 0 .  amLODipine (  NORVASC) 5 MG tablet, Take 1 tablet (5 mg total) by mouth daily., Disp: 90 tablet, Rfl: 3 .  aspirin EC 81 MG tablet, Take 81 mg by mouth daily., Disp: , Rfl:  .  cyclobenzaprine (FLEXERIL) 5 MG tablet, Take 2 tablets (10 mg total) by mouth 2 (two) times daily as needed for muscle spasms., Disp: 45 tablet, Rfl: 1 .  dexamethasone (DECADRON) 2 MG tablet, Take 1 tablet (2 mg total) by mouth 2 (two) times daily., Disp: 60 tablet, Rfl: 1 .  fluconazole (DIFLUCAN) 100 MG tablet, Take 1 tablet (100 mg total) by mouth daily., Disp: 7 tablet, Rfl: 0 .  gabapentin (NEURONTIN) 100 MG capsule, Take 2 capsules (200 mg total) by mouth 3 (three) times daily., Disp: 180 capsule, Rfl: 3 .  hydrOXYzine (ATARAX/VISTARIL) 25 MG tablet, Take 1 to 2 tablets po QHS prn insomnia/anxiety, Disp: 60 tablet, Rfl: 2 .  magic mouthwash w/lidocaine SOLN, Take 5 mLs by mouth 4 (four) times daily as needed for mouth pain., Disp: 240 mL, Rfl: 3 .  Multiple Vitamin (MULTIVITAMIN WITH  MINERALS) TABS tablet, Take 1 tablet by mouth daily., Disp: , Rfl:  .  ondansetron (ZOFRAN) 4 MG tablet, Take 1 tablet (4 mg total) by mouth every 8 (eight) hours as needed for up to 10 doses for nausea or vomiting., Disp: 20 tablet, Rfl: 0 .  ondansetron (ZOFRAN) 8 MG tablet, Take 1 tablet (8 mg total) by mouth 2 (two) times daily as needed. Start on the third day after chemotherapy., Disp: 30 tablet, Rfl: 1 .  oxyCODONE (ROXICODONE) 5 MG immediate release tablet, Take 1 tablet (5 mg total) by mouth every 4 (four) hours as needed for severe pain., Disp: 60 tablet, Rfl: 0 .  pantoprazole (PROTONIX) 20 MG tablet, Take 1 tablet (20 mg total) by mouth daily., Disp: 30 tablet, Rfl: 1 .  prochlorperazine (COMPAZINE) 10 MG tablet, Take 1 tablet (10 mg total) by mouth every 6 (six) hours as needed (Nausea or vomiting)., Disp: 30 tablet, Rfl: 1 .  sucralfate (CARAFATE) 1 g tablet, Take 1 tablet (1 g total) by mouth 3 (three) times daily before meals. Dissolve in4to 5 tbs warm water, swish &swallow, Disp: 90 tablet, Rfl: 6 .  antiseptic oral rinse (BIOTENE) LIQD, 15 mLs by Mouth Rinse route as needed for dry mouth., Disp: , Rfl:  .  dexamethasone (DECADRON) 4 MG tablet, Take 2 tablets by mouth once a day on the day after chemotherapy and then take 2 tablets two times a day for 2 days. Take with food. (Patient not taking: Reported on 06/29/2019), Disp: 30 tablet, Rfl: 1 .  lidocaine-prilocaine (EMLA) cream, Apply to affected area once (Patient not taking: Reported on 06/29/2019), Disp: 30 g, Rfl: 3 .  MAGNESIUM PO, Take by mouth., Disp: , Rfl:   Physical exam:  Vitals:   06/29/19 0835  BP: 136/71  Pulse: 75  Temp: 97.8 F (36.6 C)  TempSrc: Tympanic  Weight: 214 lb 11.2 oz (97.4 kg)   Physical Exam Constitutional:      General: He is not in acute distress. HENT:     Head: Normocephalic and atraumatic.     Mouth/Throat:     Comments: Mucositis grade 2 noted over oropharynx Eyes:     Pupils: Pupils  are equal, round, and reactive to light.  Cardiovascular:     Rate and Rhythm: Normal rate and regular rhythm.     Heart sounds: Normal heart sounds.  Pulmonary:  Effort: Pulmonary effort is normal.     Breath sounds: Normal breath sounds.  Abdominal:     General: Bowel sounds are normal.     Palpations: Abdomen is soft.  Musculoskeletal:     Cervical back: Normal range of motion.  Skin:    General: Skin is warm and dry.  Neurological:     Mental Status: He is alert and oriented to person, place, and time.      CMP Latest Ref Rng & Units 06/29/2019  Glucose 70 - 99 mg/dL 128(H)  BUN 8 - 23 mg/dL 19  Creatinine 0.61 - 1.24 mg/dL 1.28(H)  Sodium 135 - 145 mmol/L 138  Potassium 3.5 - 5.1 mmol/L 3.6  Chloride 98 - 111 mmol/L 100  CO2 22 - 32 mmol/L 27  Calcium 8.9 - 10.3 mg/dL 8.5(L)  Total Protein 6.5 - 8.1 g/dL -  Total Bilirubin 0.3 - 1.2 mg/dL -  Alkaline Phos 38 - 126 U/L -  AST 15 - 41 U/L -  ALT 0 - 44 U/L -   CBC Latest Ref Rng & Units 06/29/2019  WBC 4.0 - 10.5 K/uL 7.4  Hemoglobin 13.0 - 17.0 g/dL 13.2  Hematocrit 39.0 - 52.0 % 39.8  Platelets 150 - 400 K/uL 142(L)      Assessment and plan- Patient is a 69 y.o. male with history of nasopharyngeal carcinoma stage II T2 N0 M0. He is here for on treatment assessment prior to cycle 4 of weekly cisplatin chemotherapy  Counts are okay to proceed with cycle 4 of weekly starting chemotherapy today.  I will see him in 1 week for cycle 5 given his ongoing mucositis  Mucositis- will add prn oxycodone to magic mouthwash. Reassess in 1 week   Visit Diagnosis 1. Mucositis due to radiation therapy   2. Nasopharyngeal carcinoma ([place])   3. Encounter for antineoplastic chemotherapy      Dr. Randa Evens, MD, MPH [facility] at [facility] [id number] 07/01/2019 7:50 AM

## 2019-07-02 ENCOUNTER — Ambulatory Visit
Admission: RE | Admit: 2019-07-02 | Discharge: 2019-07-02 | Disposition: A | Discharge status: Home / Self Care | Payer: Medicare HMO | Source: Ambulatory Visit | Attending: Radiation Oncology | Admitting: Radiation Oncology

## 2019-07-02 ENCOUNTER — Other Ambulatory Visit: Payer: Self-pay

## 2019-07-02 DIAGNOSIS — C119 Malignant neoplasm of nasopharynx, unspecified: Secondary | ICD-10-CM | POA: Diagnosis not present

## 2019-07-02 DIAGNOSIS — Z510 Encounter for antineoplastic radiation therapy: Secondary | ICD-10-CM | POA: Diagnosis not present

## 2019-07-05 ENCOUNTER — Inpatient Hospital Stay: Payer: Medicare HMO

## 2019-07-05 ENCOUNTER — Other Ambulatory Visit: Payer: Self-pay

## 2019-07-05 ENCOUNTER — Ambulatory Visit: Payer: Medicare HMO

## 2019-07-05 ENCOUNTER — Other Ambulatory Visit: Payer: Self-pay | Admitting: *Deleted

## 2019-07-05 ENCOUNTER — Encounter: Payer: Self-pay | Admitting: Oncology

## 2019-07-05 ENCOUNTER — Ambulatory Visit
Admission: RE | Admit: 2019-07-05 | Discharge: 2019-07-05 | Disposition: A | Discharge status: Home / Self Care | Payer: Medicare HMO | Source: Ambulatory Visit | Attending: Radiation Oncology | Admitting: Radiation Oncology

## 2019-07-05 DIAGNOSIS — K1233 Oral mucositis (ulcerative) due to radiation: Secondary | ICD-10-CM | POA: Diagnosis not present

## 2019-07-05 DIAGNOSIS — R12 Heartburn: Secondary | ICD-10-CM | POA: Diagnosis not present

## 2019-07-05 DIAGNOSIS — N179 Acute kidney failure, unspecified: Secondary | ICD-10-CM | POA: Diagnosis not present

## 2019-07-05 DIAGNOSIS — Y842 Radiological procedure and radiotherapy as the cause of abnormal reaction of the patient, or of later complication, without mention of misadventure at the time of the procedure: Secondary | ICD-10-CM | POA: Diagnosis not present

## 2019-07-05 DIAGNOSIS — J029 Acute pharyngitis, unspecified: Secondary | ICD-10-CM | POA: Diagnosis not present

## 2019-07-05 DIAGNOSIS — K1230 Oral mucositis (ulcerative), unspecified: Secondary | ICD-10-CM | POA: Diagnosis not present

## 2019-07-05 DIAGNOSIS — I10 Essential (primary) hypertension: Secondary | ICD-10-CM | POA: Diagnosis not present

## 2019-07-05 DIAGNOSIS — C119 Malignant neoplasm of nasopharynx, unspecified: Secondary | ICD-10-CM | POA: Diagnosis not present

## 2019-07-05 DIAGNOSIS — Z510 Encounter for antineoplastic radiation therapy: Secondary | ICD-10-CM | POA: Diagnosis not present

## 2019-07-05 DIAGNOSIS — Z5111 Encounter for antineoplastic chemotherapy: Secondary | ICD-10-CM | POA: Diagnosis not present

## 2019-07-05 DIAGNOSIS — K208 Other esophagitis without bleeding: Secondary | ICD-10-CM | POA: Diagnosis not present

## 2019-07-05 DIAGNOSIS — D696 Thrombocytopenia, unspecified: Secondary | ICD-10-CM | POA: Diagnosis not present

## 2019-07-05 MED ORDER — ALPRAZOLAM 0.5 MG PO TABS: 0.5000 mg | ORAL_TABLET | Freq: Four times a day (QID) | ORAL | 0 refills | Status: DC | PRN

## 2019-07-05 NOTE — Progress Notes (Signed)
Patient stated that he had not been sleeping well at nighttime but he tries to rest during the day.

## 2019-07-05 NOTE — Progress Notes (Signed)
Nutrition Follow-up:  Patient with nasopharyngeal carcinoma. Patient receiving concurrent chemotherapy and radiation therapy.   Met with patient following radiation today.  Patient reports that he ate well over the weekend.  Had a couple of [employer] burgers and fries, whey protein shake, pancakes and sausage from [employer].  Snacking on pudding, shaved ice, ice cream.  Reports taste alterations and pain on swallowing but continues to eat.      Medications: oxycodone  Labs: reviewed  Anthropometrics:   Weight 216 lb 4 oz on 2/18 per [facility].    215-220 lb since June 2020.  Patient reports weight gain during pandemic.     NUTRITION DIAGNOSIS: Predicted suboptimal energy intake continues   INTERVENTION:  Patient to continue eating high calorie, high protein foods. Offered samples of oral nutrition supplements and declined at this time.  Will continue with whey protein shake.   Patient has contact information.    MONITORING, EVALUATION, GOAL: Patient will consume adequate calories and protein to prevent weight loss during treatment   NEXT VISIT: Monday, March 1 after radiation  Nikko Goldwire B. Zenia Resides, [place], [place] Registered Dietitian [phone] (pager)

## 2019-07-06 ENCOUNTER — Inpatient Hospital Stay: Payer: Medicare HMO

## 2019-07-06 ENCOUNTER — Other Ambulatory Visit: Payer: Self-pay

## 2019-07-06 ENCOUNTER — Inpatient Hospital Stay (HOSPITAL_BASED_OUTPATIENT_CLINIC_OR_DEPARTMENT_OTHER): Payer: Medicare HMO | Admitting: Oncology

## 2019-07-06 ENCOUNTER — Ambulatory Visit
Admission: RE | Admit: 2019-07-06 | Discharge: 2019-07-06 | Disposition: A | Discharge status: Home / Self Care | Payer: Medicare HMO | Source: Ambulatory Visit | Attending: Radiation Oncology | Admitting: Radiation Oncology

## 2019-07-06 VITALS — BP 160/62 | HR 76 | Temp 97.1°F | Ht 69.0 in | Wt 216.0 lb

## 2019-07-06 VITALS — Resp 18

## 2019-07-06 DIAGNOSIS — C119 Malignant neoplasm of nasopharynx, unspecified: Secondary | ICD-10-CM

## 2019-07-06 DIAGNOSIS — Z5111 Encounter for antineoplastic chemotherapy: Secondary | ICD-10-CM | POA: Diagnosis not present

## 2019-07-06 DIAGNOSIS — K1233 Oral mucositis (ulcerative) due to radiation: Secondary | ICD-10-CM | POA: Diagnosis not present

## 2019-07-06 DIAGNOSIS — Y842 Radiological procedure and radiotherapy as the cause of abnormal reaction of the patient, or of later complication, without mention of misadventure at the time of the procedure: Secondary | ICD-10-CM

## 2019-07-06 DIAGNOSIS — K208 Other esophagitis without bleeding: Secondary | ICD-10-CM | POA: Diagnosis not present

## 2019-07-06 DIAGNOSIS — R12 Heartburn: Secondary | ICD-10-CM | POA: Diagnosis not present

## 2019-07-06 DIAGNOSIS — K1230 Oral mucositis (ulcerative), unspecified: Secondary | ICD-10-CM | POA: Diagnosis not present

## 2019-07-06 DIAGNOSIS — Z510 Encounter for antineoplastic radiation therapy: Secondary | ICD-10-CM | POA: Diagnosis not present

## 2019-07-06 DIAGNOSIS — I10 Essential (primary) hypertension: Secondary | ICD-10-CM | POA: Diagnosis not present

## 2019-07-06 DIAGNOSIS — N179 Acute kidney failure, unspecified: Secondary | ICD-10-CM

## 2019-07-06 DIAGNOSIS — J029 Acute pharyngitis, unspecified: Secondary | ICD-10-CM | POA: Diagnosis not present

## 2019-07-06 DIAGNOSIS — D696 Thrombocytopenia, unspecified: Secondary | ICD-10-CM | POA: Diagnosis not present

## 2019-07-06 LAB — CBC WITH DIFFERENTIAL/PLATELET
Abs Immature Granulocytes: 0.05 10*3/uL (ref 0.00–0.07)
Basophils Absolute: 0 10*3/uL (ref 0.0–0.1)
Basophils Relative: 0 %
Eosinophils Absolute: 0 10*3/uL (ref 0.0–0.5)
Eosinophils Relative: 0 %
HCT: 37.6 % — ABNORMAL LOW (ref 39.0–52.0)
Hemoglobin: 12.5 g/dL — ABNORMAL LOW (ref 13.0–17.0)
Immature Granulocytes: 1 %
Lymphocytes Relative: 3 %
Lymphs Abs: 0.3 10*3/uL — ABNORMAL LOW (ref 0.7–4.0)
MCH: 29.4 pg (ref 26.0–34.0)
MCHC: 33.2 g/dL (ref 30.0–36.0)
MCV: 88.5 fL (ref 80.0–100.0)
Monocytes Absolute: 0.8 10*3/uL (ref 0.1–1.0)
Monocytes Relative: 9 %
Neutro Abs: 7.7 10*3/uL (ref 1.7–7.7)
Neutrophils Relative %: 87 %
Platelets: 113 10*3/uL — ABNORMAL LOW (ref 150–400)
RBC: 4.25 MIL/uL (ref 4.22–5.81)
RDW: 13.2 % (ref 11.5–15.5)
WBC: 8.8 10*3/uL (ref 4.0–10.5)
nRBC: 0 % (ref 0.0–0.2)

## 2019-07-06 LAB — BASIC METABOLIC PANEL
Anion gap: 12 (ref 5–15)
BUN: 27 mg/dL — ABNORMAL HIGH (ref 8–23)
CO2: 25 mmol/L (ref 22–32)
Calcium: 8.7 mg/dL — ABNORMAL LOW (ref 8.9–10.3)
Chloride: 99 mmol/L (ref 98–111)
Creatinine, Ser: 1.5 mg/dL — ABNORMAL HIGH (ref 0.61–1.24)
GFR calc Af Amer: 55 mL/min — ABNORMAL LOW (ref >60)
GFR calc non Af Amer: 48 mL/min — ABNORMAL LOW (ref >60)
Glucose, Bld: 185 mg/dL — ABNORMAL HIGH (ref 70–99)
Potassium: 4.4 mmol/L (ref 3.5–5.1)
Sodium: 136 mmol/L (ref 135–145)

## 2019-07-06 LAB — MAGNESIUM: Magnesium: 1.9 mg/dL (ref 1.7–2.4)

## 2019-07-06 MED ORDER — SODIUM CHLORIDE 0.9 % IV SOLN
150.0000 mg | Freq: Once | INTRAVENOUS | Status: AC
  Administered 2019-07-06: 14:00:00 150 mg via INTRAVENOUS
  Filled 2019-07-06: qty 5

## 2019-07-06 MED ORDER — SODIUM CHLORIDE 0.9 % IV SOLN
Freq: Once | INTRAVENOUS | Status: AC
  Filled 2019-07-06: qty 250

## 2019-07-06 MED ORDER — SODIUM CHLORIDE 0.9 % IV SOLN
10.0000 mg | Freq: Once | INTRAVENOUS | Status: AC
  Administered 2019-07-06: 14:00:00 10 mg via INTRAVENOUS
  Filled 2019-07-06: qty 1

## 2019-07-06 MED ORDER — POTASSIUM CHLORIDE 2 MEQ/ML IV SOLN
Freq: Once | INTRAVENOUS | Status: AC
  Filled 2019-07-06: qty 1000

## 2019-07-06 MED ORDER — SODIUM CHLORIDE 0.9% FLUSH
10.0000 mL | Freq: Once | INTRAVENOUS | Status: AC
  Administered 2019-07-06: 09:00:00 10 mL via INTRAVENOUS
  Filled 2019-07-06: qty 10

## 2019-07-06 MED ORDER — PALONOSETRON HCL INJECTION 0.25 MG/5ML
0.2500 mg | Freq: Once | INTRAVENOUS | Status: AC
  Administered 2019-07-06: 13:00:00 0.25 mg via INTRAVENOUS
  Filled 2019-07-06: qty 5

## 2019-07-06 MED ORDER — FAMOTIDINE IN NACL 20-0.9 MG/50ML-% IV SOLN
20.0000 mg | Freq: Once | INTRAVENOUS | Status: AC
  Administered 2019-07-06: 13:00:00 20 mg via INTRAVENOUS
  Filled 2019-07-06: qty 50

## 2019-07-06 MED ORDER — HEPARIN SOD (PORK) LOCK FLUSH 100 UNIT/ML IV SOLN
INTRAVENOUS | Status: AC
  Filled 2019-07-06: qty 5

## 2019-07-06 MED ORDER — HEPARIN SOD (PORK) LOCK FLUSH 100 UNIT/ML IV SOLN
500.0000 [IU] | Freq: Once | INTRAVENOUS | Status: AC
  Administered 2019-07-06: 16:00:00 500 [IU] via INTRAVENOUS
  Filled 2019-07-06: qty 5

## 2019-07-06 MED ORDER — SODIUM CHLORIDE 0.9 % IV SOLN
40.0000 mg/m2 | Freq: Once | INTRAVENOUS | Status: AC
  Administered 2019-07-06: 15:00:00 88 mg via INTRAVENOUS
  Filled 2019-07-06: qty 88

## 2019-07-07 ENCOUNTER — Other Ambulatory Visit: Payer: Self-pay

## 2019-07-07 ENCOUNTER — Ambulatory Visit: Payer: Medicare HMO

## 2019-07-07 ENCOUNTER — Encounter: Payer: Self-pay | Admitting: Oncology

## 2019-07-07 DIAGNOSIS — C119 Malignant neoplasm of nasopharynx, unspecified: Secondary | ICD-10-CM | POA: Diagnosis not present

## 2019-07-07 DIAGNOSIS — Z510 Encounter for antineoplastic radiation therapy: Secondary | ICD-10-CM | POA: Diagnosis not present

## 2019-07-08 ENCOUNTER — Other Ambulatory Visit: Payer: Self-pay

## 2019-07-08 ENCOUNTER — Ambulatory Visit: Payer: Medicare HMO

## 2019-07-08 ENCOUNTER — Telehealth: Payer: Self-pay | Admitting: *Deleted

## 2019-07-08 ENCOUNTER — Telehealth: Payer: Self-pay

## 2019-07-08 ENCOUNTER — Other Ambulatory Visit: Payer: Self-pay | Admitting: Oncology

## 2019-07-08 DIAGNOSIS — C119 Malignant neoplasm of nasopharynx, unspecified: Secondary | ICD-10-CM | POA: Diagnosis not present

## 2019-07-08 DIAGNOSIS — Z510 Encounter for antineoplastic radiation therapy: Secondary | ICD-10-CM | POA: Diagnosis not present

## 2019-07-08 NOTE — Telephone Encounter (Signed)
Screened and confirmed 07-09-19 ov at time the appt was scheduled.

## 2019-07-08 NOTE — Telephone Encounter (Signed)
Dr Janese Banks wanted pt to inc. Patch. I called him and explained that he can put on 2 patches and that will equal 25 mcg.  He put his new patch on wed. And I told him to put extra patch on today to make total of 2 patches and on sat. When he is suppose to change patches and take both off and then put 2 new ones on. When he has put last patches on then call me that day and we will send new rx for 84mcg patches for the future. Pt agreeable to the plan

## 2019-07-09 ENCOUNTER — Ambulatory Visit: Payer: Medicare HMO

## 2019-07-09 ENCOUNTER — Ambulatory Visit: Payer: Medicare HMO | Admitting: Nurse Practitioner

## 2019-07-09 ENCOUNTER — Other Ambulatory Visit: Payer: Self-pay

## 2019-07-09 DIAGNOSIS — C119 Malignant neoplasm of nasopharynx, unspecified: Secondary | ICD-10-CM | POA: Diagnosis not present

## 2019-07-09 DIAGNOSIS — Z510 Encounter for antineoplastic radiation therapy: Secondary | ICD-10-CM | POA: Diagnosis not present

## 2019-07-09 NOTE — Progress Notes (Signed)
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 Pryor Ochoa for symptoms of ear pain and discharge. Patient also noticed associated hearing loss and tinnitus. Patient was noted to have erythema and edema in the left sphenopalatine fossa as well as an exophytic mass on NPL exam. CT soft tissue of the neck showed asymmetric soft tissue within the left nasopharynx measuring 2.4 x 2 x 3.2 cm. There is lateral bulging with encroachment upon the left parapharyngeal space without frank infiltration. No extension into oropharynx or nasal cavity. No skull base infiltration. No appreciable mass within the larynx. No pathologically enlarged cervical lymph nodes. 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  It is a little better as compared to last week  ECOG PS- 1 Pain scale- 4 Opioid associated constipation- no  Review of systems- Review of Systems  Constitutional: Negative for chills, fever, malaise/fatigue and weight loss.  HENT: Negative for congestion, ear discharge and nosebleeds.   Eyes: Negative for blurred vision.   Respiratory: Negative for cough, hemoptysis, sputum production, shortness of breath and wheezing.   Cardiovascular: Negative for chest pain, palpitations, orthopnea and claudication.  Gastrointestinal: Negative for abdominal pain, blood in stool, constipation, diarrhea, heartburn, melena, nausea and vomiting.       Pain with swallowing  Genitourinary: Negative for dysuria, flank pain, frequency, hematuria and urgency.  Musculoskeletal: Negative for back pain, joint pain and myalgias.  Skin: Negative for rash.  Neurological: Negative for dizziness, tingling, focal weakness, seizures, weakness and headaches.  Endo/Heme/Allergies: Does not bruise/bleed easily.  Psychiatric/Behavioral: Negative for depression and suicidal ideas. The patient does not have insomnia.        Allergies  Allergen Reactions  . Opana [Oxymorphone Hcl]     Made him BLACKOUT  . Amoxicillin Other (See Comments)  . Oxymorphone Other (See Comments)  . Sulfa Antibiotics Other (See Comments)  . Sulfur     Childhood reaction      Past Medical History:  Diagnosis Date  . Alcohol abuse   . Benzodiazepine dependence ([place])   . Benzodiazepine withdrawal ([place])   . Chronic pain in right foot   . COPD (chronic obstructive pulmonary disease) ([place])   . Depression 08/19/2017  . Hepatitis C   . Hypertension   . 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[place])   . Seizures ([place]) 2010  . Sleep apnea   . Syncope    questionable vasovagal     Past Surgical History:  Procedure Laterality Date  . COLONOSCOPY WITH PROPOFOL N/A 12/16/2016   Procedure: COLONOSCOPY WITH PROPOFOL;  Surgeon: Lollie Sails, MD;  Location: [facility] ENDOSCOPY;  Service: Endoscopy;  Laterality: N/A;  . COLONOSCOPY WITH PROPOFOL N/A 03/14/2017   Procedure: COLONOSCOPY WITH  PROPOFOL;  Surgeon: Lollie Sails, MD;  Location: [facility] ENDOSCOPY;  Service: Endoscopy;  Laterality: N/A;  . FOOT SURGERY Right 03/12/2002  . HEMORRHOID SURGERY    . LITHOTRIPSY    . MYRINGOTOMY  WITH TUBE PLACEMENT Left 04/28/2019   Procedure: MYRINGOTOMY WITH TUBE PLACEMENT;  Surgeon: Carloyn Manner, MD;  Location: [place];  Service: ENT;  Laterality: Left;  . NASOPHARYNGOSCOPY N/A 04/28/2019   Procedure: NASOPHARYNGOSCOPY WITH BIOPSY;  Surgeon: Carloyn Manner, MD;  Location: [place];  Service: ENT;  Laterality: N/A;  . PORTA CATH INSERTION N/A 05/20/2019   Procedure: PORTA CATH INSERTION;  Surgeon: Algernon Huxley, MD;  Location: [place] CV LAB;  Service: Cardiovascular;  Laterality: N/A;  . TONSILLECTOMY      Social History   Socioeconomic History  . Marital status: Divorced    Spouse name: Not on file  . Number of children: Not on file  . Years of education: Not on file  . Highest education level: Not on file  Occupational History  . Not on file  Tobacco Use  . Smoking status: Former Smoker    Packs/day: 0.50    Years: 50.00    Pack years: 25.00    Types: Cigarettes    Quit date: 02/05/2019    Years since quitting: 0.4  . Smokeless tobacco: Never Used  Substance and Sexual Activity  . Alcohol use: Yes    Alcohol/week: 7.0 standard drinks    Types: 7 Standard drinks or equivalent per week    Comment: rarely  . Drug use: Not Currently  . Sexual activity: Not Currently  Other Topics Concern  . Not on file  Social History Narrative  . Not on file   Social Determinants of Health   Financial Resource Strain:   . Difficulty of Paying Living Expenses: Not on file  Food Insecurity: No Food Insecurity  . Worried About Charity fundraiser in the Last Year: Never true  . Ran Out of Food in the Last Year: Never true  Transportation Needs: No Transportation Needs  . Lack of Transportation (Medical): No  . Lack of Transportation (Non-Medical): No  Physical Activity: Inactive  . Days of Exercise per Week: 0 days  . Minutes of Exercise per Session: 0 min  Stress:   . Feeling of Stress : Not on file  Social Connections:   . Frequency of  Communication with Friends and Family: Not on file  . Frequency of Social Gatherings with Friends and Family: Not on file  . Attends Religious Services: Not on file  . Active Member of Clubs or Organizations: Not on file  . Attends Archivist Meetings: Not on file  . Marital Status: Not on file  Intimate Partner Violence: Not At Risk  . Fear of Current or Ex-Partner: No  . Emotionally Abused: No  . Physically Abused: No  . Sexually Abused: No    Family History  Problem Relation Age of Onset  . Arthritis Mother   . Hypertension Mother   . Cancer Father   . Kidney disease Father   . Alcohol abuse Paternal Aunt   . Depression Maternal Grandfather      Current Outpatient Medications:  .  albuterol (VENTOLIN HFA) 108 (90 Base) MCG/ACT inhaler, Inhale 2 puffs into the lungs every 6 (six) hours as needed for wheezing or shortness of breath., Disp: 18 g, Rfl: 3 .  ALPRAZolam (XANAX) 0.5 MG tablet, Take 1 tablet (0.5 mg total) by mouth  every 6 (six) hours as needed for anxiety., Disp: 60 tablet, Rfl: 0 .  amLODipine (NORVASC) 5 MG tablet, Take 1 tablet (5 mg total) by mouth daily., Disp: 90 tablet, Rfl: 3 .  antiseptic oral rinse (BIOTENE) LIQD, 15 mLs by Mouth Rinse route as needed for dry mouth., Disp: , Rfl:  .  aspirin EC 81 MG tablet, Take 81 mg by mouth daily., Disp: , Rfl:  .  cyclobenzaprine (FLEXERIL) 5 MG tablet, Take 2 tablets (10 mg total) by mouth 2 (two) times daily as needed for muscle spasms., Disp: 45 tablet, Rfl: 1 .  dexamethasone (DECADRON) 2 MG tablet, Take 1 tablet (2 mg total) by mouth 2 (two) times daily., Disp: 60 tablet, Rfl: 1 .  dexamethasone (DECADRON) 4 MG tablet, Take 2 tablets by mouth once a day on the day after chemotherapy and then take 2 tablets two times a day for 2 days. Take with food., Disp: 30 tablet, Rfl: 1 .  fentaNYL (DURAGESIC) 12 MCG/HR, Place 1 patch onto the skin every 3 (three) days., Disp: 10 patch, Rfl: 0 .  fluconazole (DIFLUCAN)  100 MG tablet, Take 1 tablet (100 mg total) by mouth daily., Disp: 7 tablet, Rfl: 0 .  gabapentin (NEURONTIN) 100 MG capsule, Take 2 capsules (200 mg total) by mouth 3 (three) times daily., Disp: 180 capsule, Rfl: 3 .  hydrOXYzine (ATARAX/VISTARIL) 25 MG tablet, Take 1 to 2 tablets po QHS prn insomnia/anxiety, Disp: 60 tablet, Rfl: 2 .  lidocaine-prilocaine (EMLA) cream, Apply to affected area once, Disp: 30 g, Rfl: 3 .  magic mouthwash w/lidocaine SOLN, Take 5 mLs by mouth 4 (four) times daily as needed for mouth pain., Disp: 240 mL, Rfl: 3 .  MAGNESIUM PO, Take by mouth., Disp: , Rfl:  .  Multiple Vitamin (MULTIVITAMIN WITH MINERALS) TABS tablet, Take 1 tablet by mouth daily., Disp: , Rfl:  .  ondansetron (ZOFRAN) 4 MG tablet, Take 1 tablet (4 mg total) by mouth every 8 (eight) hours as needed for up to 10 doses for nausea or vomiting., Disp: 20 tablet, Rfl: 0 .  ondansetron (ZOFRAN) 8 MG tablet, Take 1 tablet (8 mg total) by mouth 2 (two) times daily as needed. Start on the third day after chemotherapy., Disp: 30 tablet, Rfl: 1 .  Oxycodone HCl 10 MG TABS, Take 1 tablet (10 mg total) by mouth every 4 (four) hours as needed., Disp: 180 tablet, Rfl: 0 .  pantoprazole (PROTONIX) 20 MG tablet, Take 1 tablet (20 mg total) by mouth daily., Disp: 30 tablet, Rfl: 1 .  prochlorperazine (COMPAZINE) 10 MG tablet, Take 1 tablet (10 mg total) by mouth every 6 (six) hours as needed (Nausea or vomiting)., Disp: 30 tablet, Rfl: 1 .  sucralfate (CARAFATE) 1 g tablet, Take 1 tablet (1 g total) by mouth 3 (three) times daily before meals. Dissolve in4to 5 tbs warm water, swish &swallow, Disp: 90 tablet, Rfl: 6  Physical exam:  Vitals:   07/06/19 0948  BP: (!) 160/62  Pulse: 76  Temp: (!) 97.1 F (36.2 C)  TempSrc: Tympanic  Weight: 216 lb (98 kg)  Height: 5\' 9"  (1.753 m)   Physical Exam Constitutional:      General: He is not in acute distress. HENT:     Head: Normocephalic and atraumatic.      Mouth/Throat:     Comments: Grade 2 mucositis Eyes:     Pupils: Pupils are equal, round, and reactive to light.  Cardiovascular:  Rate and Rhythm: Normal rate and regular rhythm.     Heart sounds: Normal heart sounds.  Pulmonary:     Effort: Pulmonary effort is normal.     Breath sounds: Normal breath sounds.  Abdominal:     General: Bowel sounds are normal.     Palpations: Abdomen is soft.  Musculoskeletal:     Cervical back: Normal range of motion.  Skin:    General: Skin is warm and dry.  Neurological:     Mental Status: He is alert and oriented to person, place, and time.      CMP Latest Ref Rng & Units 07/06/2019  Glucose 70 - 99 mg/dL 185(H)  BUN 8 - 23 mg/dL 27(H)  Creatinine 0.61 - 1.24 mg/dL 1.50(H)  Sodium 135 - 145 mmol/L 136  Potassium 3.5 - 5.1 mmol/L 4.4  Chloride 98 - 111 mmol/L 99  CO2 22 - 32 mmol/L 25  Calcium 8.9 - 10.3 mg/dL 8.7(L)  Total Protein 6.5 - 8.1 g/dL -  Total Bilirubin 0.3 - 1.2 mg/dL -  Alkaline Phos 38 - 126 U/L -  AST 15 - 41 U/L -  ALT 0 - 44 U/L -   CBC Latest Ref Rng & Units 07/06/2019  WBC 4.0 - 10.5 K/uL 8.8  Hemoglobin 13.0 - 17.0 g/dL 12.5(L)  Hematocrit 39.0 - 52.0 % 37.6(L)  Platelets 150 - 400 K/uL 113(L)    Assessment and plan- Patient is a 69 y.o. male with history of nasopharyngeal carcinoma stage II T2 N0 M0.  He is here for on treatment assessment prior to cycle 5 of weekly cisplatin chemotherapy  Counts okay to proceed with cycle 5 of weekly cisplatin chemotherapy today.  He has mild thrombocytopenia which we will continue to monitor  Radiation esophagitis and mucositis: Patient will continue Magic mouthwash and sucralfate.  He is also taking as needed oxycodone.  I will increase his fentanyl patch to 25 mcg given his uncontrolled pain.  He is continuing with his bowel medications to prevent opioid-induced constipation.  AKI: Serum creatinine is up to 1.5 today.  He will be receiving 1 L of fluids along with  cisplatin and I will give him an additional liter today.  Also encouraged patient to improve his oral intake  Patient will directly proceed for cisplatin next week and will likely receive additional IV fluids at that time and I will see him back in 2 weeks for cycle 7 of cisplatin   Visit Diagnosis 1. Encounter for antineoplastic chemotherapy   2. Mucositis due to radiation therapy   3. Nasopharyngeal carcinoma ([place])   4. AKI (acute kidney injury) ([place])      Dr. Randa Evens, MD, MPH [facility] [id number] 07/09/2019 7:52 AM

## 2019-07-10 ENCOUNTER — Ambulatory Visit: Payer: Medicare HMO

## 2019-07-12 ENCOUNTER — Other Ambulatory Visit: Payer: Self-pay | Admitting: *Deleted

## 2019-07-12 ENCOUNTER — Encounter: Payer: Self-pay | Admitting: Oncology

## 2019-07-12 ENCOUNTER — Other Ambulatory Visit: Payer: Self-pay

## 2019-07-12 ENCOUNTER — Ambulatory Visit
Admission: RE | Admit: 2019-07-12 | Discharge: 2019-07-12 | Disposition: A | Discharge status: Home / Self Care | Payer: Medicare HMO | Source: Ambulatory Visit | Attending: Radiation Oncology | Admitting: Radiation Oncology

## 2019-07-12 ENCOUNTER — Telehealth: Payer: Self-pay | Admitting: *Deleted

## 2019-07-12 ENCOUNTER — Inpatient Hospital Stay: Payer: Medicare HMO | Attending: Radiation Oncology

## 2019-07-12 DIAGNOSIS — Z8249 Family history of ischemic heart disease and other diseases of the circulatory system: Secondary | ICD-10-CM | POA: Insufficient documentation

## 2019-07-12 DIAGNOSIS — R112 Nausea with vomiting, unspecified: Secondary | ICD-10-CM | POA: Insufficient documentation

## 2019-07-12 DIAGNOSIS — Z7952 Long term (current) use of systemic steroids: Secondary | ICD-10-CM | POA: Insufficient documentation

## 2019-07-12 DIAGNOSIS — R7989 Other specified abnormal findings of blood chemistry: Secondary | ICD-10-CM | POA: Insufficient documentation

## 2019-07-12 DIAGNOSIS — R42 Dizziness and giddiness: Secondary | ICD-10-CM | POA: Insufficient documentation

## 2019-07-12 DIAGNOSIS — C119 Malignant neoplasm of nasopharynx, unspecified: Secondary | ICD-10-CM | POA: Insufficient documentation

## 2019-07-12 DIAGNOSIS — G893 Neoplasm related pain (acute) (chronic): Secondary | ICD-10-CM | POA: Insufficient documentation

## 2019-07-12 DIAGNOSIS — Z510 Encounter for antineoplastic radiation therapy: Secondary | ICD-10-CM | POA: Diagnosis not present

## 2019-07-12 DIAGNOSIS — Z5111 Encounter for antineoplastic chemotherapy: Secondary | ICD-10-CM | POA: Insufficient documentation

## 2019-07-12 DIAGNOSIS — I10 Essential (primary) hypertension: Secondary | ICD-10-CM | POA: Insufficient documentation

## 2019-07-12 DIAGNOSIS — Z5189 Encounter for other specified aftercare: Secondary | ICD-10-CM | POA: Insufficient documentation

## 2019-07-12 DIAGNOSIS — J449 Chronic obstructive pulmonary disease, unspecified: Secondary | ICD-10-CM | POA: Insufficient documentation

## 2019-07-12 DIAGNOSIS — D701 Agranulocytosis secondary to cancer chemotherapy: Secondary | ICD-10-CM | POA: Insufficient documentation

## 2019-07-12 DIAGNOSIS — F329 Major depressive disorder, single episode, unspecified: Secondary | ICD-10-CM | POA: Insufficient documentation

## 2019-07-12 DIAGNOSIS — Z79899 Other long term (current) drug therapy: Secondary | ICD-10-CM | POA: Insufficient documentation

## 2019-07-12 DIAGNOSIS — N179 Acute kidney failure, unspecified: Secondary | ICD-10-CM | POA: Insufficient documentation

## 2019-07-12 DIAGNOSIS — D6959 Other secondary thrombocytopenia: Secondary | ICD-10-CM | POA: Insufficient documentation

## 2019-07-12 DIAGNOSIS — Z8261 Family history of arthritis: Secondary | ICD-10-CM | POA: Insufficient documentation

## 2019-07-12 DIAGNOSIS — T451X5A Adverse effect of antineoplastic and immunosuppressive drugs, initial encounter: Secondary | ICD-10-CM | POA: Insufficient documentation

## 2019-07-12 DIAGNOSIS — R5383 Other fatigue: Secondary | ICD-10-CM | POA: Insufficient documentation

## 2019-07-12 DIAGNOSIS — R040 Epistaxis: Secondary | ICD-10-CM | POA: Insufficient documentation

## 2019-07-12 DIAGNOSIS — Z87891 Personal history of nicotine dependence: Secondary | ICD-10-CM | POA: Insufficient documentation

## 2019-07-12 DIAGNOSIS — Z7982 Long term (current) use of aspirin: Secondary | ICD-10-CM | POA: Insufficient documentation

## 2019-07-12 MED ORDER — OXYCODONE HCL ER 20 MG PO T12A: 20.0000 mg | EXTENDED_RELEASE_TABLET | Freq: Two times a day (BID) | ORAL | 0 refills | Status: DC

## 2019-07-12 NOTE — Telephone Encounter (Signed)
Pt put on 2 patches because we inc. To 25 mcg and changed patch on sat. And woke up Sunday and patches fell off. Then put 2 more on. Today 1 fell off and he went back to bed to nap and woke up and second was off. We are switching him to oxycontin

## 2019-07-12 NOTE — Telephone Encounter (Signed)
The pt. Was having problems keeping the patches on his arm. He cleans his arm, dries his arm and puts them on and they come off. We had asked him to use 2 patches to inc. Dose for pain and they have fallen off twice. He has put new one on today and we covered them with tegaderm. Due to this dr Janese Banks will cancel duragesic patches and we will add on oxycontin 20 mg q12 hour starting when patches end in 72 hours or if they fall off again before wed of this week. Pt understands. Then the pharmacy calls and says that drug not covered. 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 MS ER that she would like to keep the same med oxycodone that is break through med with oxycontin long acting to be same drug family the drug was over irdden and approved to give. They needed 1 hour before pharmacy can run the drug again. They will deliver the med to the pt. Because pt. Signed up for delivery service and pharmacy and pt. Aware of all above.

## 2019-07-12 NOTE — Telephone Encounter (Signed)
Patient called reporting that he cannot get his fentanyl patches to stay on and he has used all but 3 patches due to them falling off. He states he will run out before they are due again. He states he has been putting them on his shoulder which is not hairy and even cleaned the area with alcohol before applying patch and they did not stick for long. Please advise

## 2019-07-12 NOTE — Progress Notes (Signed)
Nutrition Follow-up:   Patient with nasopharyngeal carcinoma.  Patient receiving concurrent chemotherapy and radiation therapy.    Met with patient following radiation.  Patient reports taste alterations.  Reports tried to eat some chicken this weekend and tasted like cardboard. Eating eggs, shakes, ice cream.  Wanting to speak with Dr. Rao's nurse regarding pain medications and recent phone call from pharmacy.    Medications: reviewed  Labs: reviewed  Anthropometrics:   Weight 216 lb on 2/23, relatively stable.  215-220 lb since June 2020.  Patient reports weight gain during pandemic   NUTRITION DIAGNOSIS:  Predicted suboptimal energy intake continues   INTERVENTION:  Discussed good sources of protein and provided handout on foods that are protein rich.   Had Sherri, RN for Dr. Rao speak with patient and answer questions.  Patient has contact information    MONITORING, EVALUATION, GOAL: Patient will consume adequate calories and protein to prevent weight loss during treatment   NEXT VISIT: March 8th after radiation   B. , RD, LDN Registered Dietitian [phone] (pager)    

## 2019-07-12 NOTE — Progress Notes (Signed)
Patient stated that he had been doing well but has had non-stop nose bleeds.

## 2019-07-13 ENCOUNTER — Telehealth: Payer: Self-pay

## 2019-07-13 ENCOUNTER — Inpatient Hospital Stay: Payer: Medicare HMO

## 2019-07-13 ENCOUNTER — Ambulatory Visit: Payer: Medicare HMO

## 2019-07-13 ENCOUNTER — Other Ambulatory Visit: Payer: Self-pay

## 2019-07-13 ENCOUNTER — Ambulatory Visit
Admission: RE | Admit: 2019-07-13 | Discharge: 2019-07-13 | Disposition: A | Discharge status: Home / Self Care | Payer: Medicare HMO | Source: Ambulatory Visit | Attending: Radiation Oncology | Admitting: Radiation Oncology

## 2019-07-13 ENCOUNTER — Inpatient Hospital Stay (HOSPITAL_BASED_OUTPATIENT_CLINIC_OR_DEPARTMENT_OTHER): Payer: Medicare HMO | Admitting: Oncology

## 2019-07-13 VITALS — BP 185/92 | HR 103 | Temp 98.7°F | Ht 69.0 in | Wt 220.0 lb

## 2019-07-13 DIAGNOSIS — Z8249 Family history of ischemic heart disease and other diseases of the circulatory system: Secondary | ICD-10-CM | POA: Diagnosis not present

## 2019-07-13 DIAGNOSIS — Z87891 Personal history of nicotine dependence: Secondary | ICD-10-CM | POA: Diagnosis not present

## 2019-07-13 DIAGNOSIS — Z8261 Family history of arthritis: Secondary | ICD-10-CM | POA: Diagnosis not present

## 2019-07-13 DIAGNOSIS — T451X5A Adverse effect of antineoplastic and immunosuppressive drugs, initial encounter: Secondary | ICD-10-CM

## 2019-07-13 DIAGNOSIS — Z7952 Long term (current) use of systemic steroids: Secondary | ICD-10-CM | POA: Diagnosis not present

## 2019-07-13 DIAGNOSIS — G893 Neoplasm related pain (acute) (chronic): Secondary | ICD-10-CM | POA: Diagnosis not present

## 2019-07-13 DIAGNOSIS — R42 Dizziness and giddiness: Secondary | ICD-10-CM | POA: Diagnosis not present

## 2019-07-13 DIAGNOSIS — R040 Epistaxis: Secondary | ICD-10-CM

## 2019-07-13 DIAGNOSIS — D6959 Other secondary thrombocytopenia: Secondary | ICD-10-CM | POA: Diagnosis not present

## 2019-07-13 DIAGNOSIS — C119 Malignant neoplasm of nasopharynx, unspecified: Secondary | ICD-10-CM

## 2019-07-13 DIAGNOSIS — J449 Chronic obstructive pulmonary disease, unspecified: Secondary | ICD-10-CM

## 2019-07-13 DIAGNOSIS — F329 Major depressive disorder, single episode, unspecified: Secondary | ICD-10-CM | POA: Diagnosis not present

## 2019-07-13 DIAGNOSIS — I10 Essential (primary) hypertension: Secondary | ICD-10-CM | POA: Diagnosis not present

## 2019-07-13 DIAGNOSIS — Z5111 Encounter for antineoplastic chemotherapy: Secondary | ICD-10-CM

## 2019-07-13 DIAGNOSIS — N179 Acute kidney failure, unspecified: Secondary | ICD-10-CM | POA: Diagnosis not present

## 2019-07-13 DIAGNOSIS — R69 Illness, unspecified: Secondary | ICD-10-CM | POA: Diagnosis not present

## 2019-07-13 DIAGNOSIS — Z7982 Long term (current) use of aspirin: Secondary | ICD-10-CM | POA: Diagnosis not present

## 2019-07-13 DIAGNOSIS — D701 Agranulocytosis secondary to cancer chemotherapy: Secondary | ICD-10-CM | POA: Diagnosis not present

## 2019-07-13 DIAGNOSIS — Z510 Encounter for antineoplastic radiation therapy: Secondary | ICD-10-CM | POA: Diagnosis not present

## 2019-07-13 DIAGNOSIS — Z79899 Other long term (current) drug therapy: Secondary | ICD-10-CM

## 2019-07-13 DIAGNOSIS — R5383 Other fatigue: Secondary | ICD-10-CM | POA: Diagnosis not present

## 2019-07-13 DIAGNOSIS — R7989 Other specified abnormal findings of blood chemistry: Secondary | ICD-10-CM | POA: Diagnosis not present

## 2019-07-13 DIAGNOSIS — Z5189 Encounter for other specified aftercare: Secondary | ICD-10-CM | POA: Diagnosis present

## 2019-07-13 DIAGNOSIS — R112 Nausea with vomiting, unspecified: Secondary | ICD-10-CM | POA: Diagnosis not present

## 2019-07-13 LAB — BASIC METABOLIC PANEL
Anion gap: 10 (ref 5–15)
BUN: 14 mg/dL (ref 8–23)
CO2: 28 mmol/L (ref 22–32)
Calcium: 8.1 mg/dL — ABNORMAL LOW (ref 8.9–10.3)
Chloride: 95 mmol/L — ABNORMAL LOW (ref 98–111)
Creatinine, Ser: 1.18 mg/dL (ref 0.61–1.24)
GFR calc Af Amer: >60 mL/min (ref >60)
GFR calc non Af Amer: >60 mL/min (ref >60)
Glucose, Bld: 128 mg/dL — ABNORMAL HIGH (ref 70–99)
Potassium: 4.1 mmol/L (ref 3.5–5.1)
Sodium: 133 mmol/L — ABNORMAL LOW (ref 135–145)

## 2019-07-13 LAB — CBC WITH DIFFERENTIAL/PLATELET
Abs Immature Granulocytes: 0.01 10*3/uL (ref 0.00–0.07)
Basophils Absolute: 0 10*3/uL (ref 0.0–0.1)
Basophils Relative: 0 %
Eosinophils Absolute: 0 10*3/uL (ref 0.0–0.5)
Eosinophils Relative: 0 %
HCT: 32.5 % — ABNORMAL LOW (ref 39.0–52.0)
Hemoglobin: 10.6 g/dL — ABNORMAL LOW (ref 13.0–17.0)
Immature Granulocytes: 0 %
Lymphocytes Relative: 15 %
Lymphs Abs: 0.4 10*3/uL — ABNORMAL LOW (ref 0.7–4.0)
MCH: 29.3 pg (ref 26.0–34.0)
MCHC: 32.6 g/dL (ref 30.0–36.0)
MCV: 89.8 fL (ref 80.0–100.0)
Monocytes Absolute: 0.2 10*3/uL (ref 0.1–1.0)
Monocytes Relative: 7 %
Neutro Abs: 1.8 10*3/uL (ref 1.7–7.7)
Neutrophils Relative %: 78 %
Platelets: 48 10*3/uL — ABNORMAL LOW (ref 150–400)
RBC: 3.62 MIL/uL — ABNORMAL LOW (ref 4.22–5.81)
RDW: 13.2 % (ref 11.5–15.5)
WBC: 2.3 10*3/uL — ABNORMAL LOW (ref 4.0–10.5)
nRBC: 0 % (ref 0.0–0.2)

## 2019-07-13 LAB — MAGNESIUM: Magnesium: 1.2 mg/dL — ABNORMAL LOW (ref 1.7–2.4)

## 2019-07-13 MED ORDER — SODIUM CHLORIDE 0.9 % IV SOLN
INTRAVENOUS | Status: DC
  Filled 2019-07-13: qty 250

## 2019-07-13 MED ORDER — SODIUM CHLORIDE 0.9% FLUSH
10.0000 mL | Freq: Once | INTRAVENOUS | Status: AC
  Administered 2019-07-13: 10 mL via INTRAVENOUS
  Filled 2019-07-13: qty 10

## 2019-07-13 MED ORDER — HEPARIN SOD (PORK) LOCK FLUSH 100 UNIT/ML IV SOLN
500.0000 [IU] | Freq: Once | INTRAVENOUS | Status: AC
  Administered 2019-07-13: 500 [IU] via INTRAVENOUS
  Filled 2019-07-13: qty 5

## 2019-07-13 MED ORDER — MAGNESIUM SULFATE 2 GM/50ML IV SOLN
2.0000 g | Freq: Once | INTRAVENOUS | Status: AC
  Administered 2019-07-13: 2 g via INTRAVENOUS
  Filled 2019-07-13: qty 50

## 2019-07-13 NOTE — Progress Notes (Signed)
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 Pryor Ochoa for symptoms of ear pain and discharge. Patient also noticed associated hearing loss and tinnitus. Patient was noted to have erythema and edema in the left sphenopalatine fossa as well as an exophytic mass on NPL exam. CT soft tissue of the neck showed asymmetric soft tissue within the left nasopharynx measuring 2.4 x 2 x 3.2 cm. There is lateral bulging with encroachment upon the left parapharyngeal space without frank infiltration. No extension into oropharynx or nasal cavity. No skull base infiltration. No appreciable mass within the larynx. No pathologically enlarged cervical lymph nodes. 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  Reports more frequent nosebleeds when he blows his nose.  He has been using Vaseline and nasal saline spray.  These episodes happen only when he blows his nose but not otherwise 2.  Continues to have pain during swallowing and he has been unable to use his fentanyl patches correctly and reports that they keep falling off  ECOG PS- 1 Pain scale- 4 Opioid associated constipation- no  Review of systems- Review of Systems  Constitutional: Positive  for malaise/fatigue. Negative for chills, fever and weight loss.  HENT: Positive for nosebleeds. Negative for congestion and ear discharge.   Eyes: Negative for blurred vision.  Respiratory: Negative for cough, hemoptysis, sputum production, shortness of breath and wheezing.   Cardiovascular: Negative for chest pain, palpitations, orthopnea and claudication.  Gastrointestinal: Negative for abdominal pain, blood in stool, constipation, diarrhea, heartburn, melena, nausea and vomiting.       Pain with swallowing  Genitourinary: Negative for dysuria, flank pain, frequency, hematuria and urgency.  Musculoskeletal: Negative for back pain, joint pain and myalgias.  Skin: Negative for rash.  Neurological: Negative for dizziness, tingling, focal weakness, seizures, weakness and headaches.  Endo/Heme/Allergies: Does not bruise/bleed easily.  Psychiatric/Behavioral: Negative for depression and suicidal ideas. The patient does not have insomnia.       Allergies  Allergen Reactions  . Opana [Oxymorphone Hcl]     Made him BLACKOUT  . Amoxicillin Other (See Comments)  . Oxymorphone Other (See Comments)  . Sulfa Antibiotics Other (See Comments)  . Sulfur     Childhood reaction      Past Medical History:  Diagnosis Date  . Alcohol abuse   . Benzodiazepine dependence ([place])   . Benzodiazepine withdrawal ([place])   . Chronic pain in right foot   . COPD (chronic obstructive pulmonary disease) ([place])   . Depression 08/19/2017  . Hepatitis C   . Hypertension   . 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[place])   . Seizures ([place]) 2010  . Sleep apnea   . Syncope    questionable vasovagal     Past Surgical History:  Procedure Laterality Date  . COLONOSCOPY WITH PROPOFOL N/A 12/16/2016   Procedure:  COLONOSCOPY WITH PROPOFOL;  Surgeon: Lollie Sails, MD;  Location: [facility] ENDOSCOPY;  Service: Endoscopy;  Laterality: N/A;  . COLONOSCOPY WITH PROPOFOL N/A 03/14/2017   Procedure: COLONOSCOPY WITH PROPOFOL;  Surgeon:  Lollie Sails, MD;  Location: [facility] ENDOSCOPY;  Service: Endoscopy;  Laterality: N/A;  . FOOT SURGERY Right 03/12/2002  . HEMORRHOID SURGERY    . LITHOTRIPSY    . MYRINGOTOMY WITH TUBE PLACEMENT Left 04/28/2019   Procedure: MYRINGOTOMY WITH TUBE PLACEMENT;  Surgeon: Carloyn Manner, MD;  Location: [place];  Service: ENT;  Laterality: Left;  . NASOPHARYNGOSCOPY N/A 04/28/2019   Procedure: NASOPHARYNGOSCOPY WITH BIOPSY;  Surgeon: Carloyn Manner, MD;  Location: [place];  Service: ENT;  Laterality: N/A;  . PORTA CATH INSERTION N/A 05/20/2019   Procedure: PORTA CATH INSERTION;  Surgeon: Algernon Huxley, MD;  Location: [place] CV LAB;  Service: Cardiovascular;  Laterality: N/A;  . TONSILLECTOMY      Social History   Socioeconomic History  . Marital status: Divorced    Spouse name: Not on file  . Number of children: Not on file  . Years of education: Not on file  . Highest education level: Not on file  Occupational History  . Not on file  Tobacco Use  . Smoking status: Former Smoker    Packs/day: 0.50    Years: 50.00    Pack years: 25.00    Types: Cigarettes    Quit date: 02/05/2019    Years since quitting: 0.4  . Smokeless tobacco: Never Used  Substance and Sexual Activity  . Alcohol use: Yes    Alcohol/week: 7.0 standard drinks    Types: 7 Standard drinks or equivalent per week    Comment: rarely  . Drug use: Not Currently  . Sexual activity: Not Currently  Other Topics Concern  . Not on file  Social History Narrative  . Not on file   Social Determinants of Health   Financial Resource Strain:   . Difficulty of Paying Living Expenses: Not on file  Food Insecurity: No Food Insecurity  . Worried About Charity fundraiser in the Last Year: Never true  . Ran Out of Food in the Last Year: Never true  Transportation Needs: No Transportation Needs  . Lack of Transportation (Medical): No  . Lack of Transportation (Non-Medical): No  Physical  Activity: Inactive  . Days of Exercise per Week: 0 days  . Minutes of Exercise per Session: 0 min  Stress:   . Feeling of Stress : Not on file  Social Connections:   . Frequency of Communication with Friends and Family: Not on file  . Frequency of Social Gatherings with Friends and Family: Not on file  . Attends Religious Services: Not on file  . Active Member of Clubs or Organizations: Not on file  . Attends Archivist Meetings: Not on file  . Marital Status: Not on file  Intimate Partner Violence: Not At Risk  . Fear of Current or Ex-Partner: No  . Emotionally Abused: No  . Physically Abused: No  . Sexually Abused: No    Family History  Problem Relation Age of Onset  . Arthritis Mother   . Hypertension Mother   . Cancer Father   . Kidney disease Father   . Alcohol abuse Paternal Aunt   . Depression Maternal Grandfather      Current Outpatient Medications:  .  albuterol (VENTOLIN HFA) 108 (90 Base) MCG/ACT inhaler, Inhale 2 puffs into the lungs every  6 (six) hours as needed for wheezing or shortness of breath., Disp: 18 g, Rfl: 3 .  ALPRAZolam (XANAX) 0.5 MG tablet, Take 1 tablet (0.5 mg total) by mouth every 6 (six) hours as needed for anxiety., Disp: 60 tablet, Rfl: 0 .  amLODipine (NORVASC) 5 MG tablet, Take 1 tablet (5 mg total) by mouth daily., Disp: 90 tablet, Rfl: 3 .  antiseptic oral rinse (BIOTENE) LIQD, 15 mLs by Mouth Rinse route as needed for dry mouth., Disp: , Rfl:  .  aspirin EC 81 MG tablet, Take 81 mg by mouth daily., Disp: , Rfl:  .  cyclobenzaprine (FLEXERIL) 5 MG tablet, Take 2 tablets (10 mg total) by mouth 2 (two) times daily as needed for muscle spasms., Disp: 45 tablet, Rfl: 1 .  dexamethasone (DECADRON) 2 MG tablet, Take 1 tablet (2 mg total) by mouth 2 (two) times daily., Disp: 60 tablet, Rfl: 1 .  dexamethasone (DECADRON) 4 MG tablet, Take 2 tablets by mouth once a day on the day after chemotherapy and then take 2 tablets two times a day for  2 days. Take with food., Disp: 30 tablet, Rfl: 1 .  fluconazole (DIFLUCAN) 100 MG tablet, Take 1 tablet (100 mg total) by mouth daily., Disp: 7 tablet, Rfl: 0 .  gabapentin (NEURONTIN) 100 MG capsule, Take 2 capsules (200 mg total) by mouth 3 (three) times daily., Disp: 180 capsule, Rfl: 3 .  hydrOXYzine (ATARAX/VISTARIL) 25 MG tablet, Take 1 to 2 tablets po QHS prn insomnia/anxiety, Disp: 60 tablet, Rfl: 2 .  lidocaine-prilocaine (EMLA) cream, Apply to affected area once, Disp: 30 g, Rfl: 3 .  magic mouthwash w/lidocaine SOLN, Take 5 mLs by mouth 4 (four) times daily as needed for mouth pain., Disp: 240 mL, Rfl: 3 .  MAGNESIUM PO, Take by mouth., Disp: , Rfl:  .  Multiple Vitamin (MULTIVITAMIN WITH MINERALS) TABS tablet, Take 1 tablet by mouth daily., Disp: , Rfl:  .  ondansetron (ZOFRAN) 4 MG tablet, Take 1 tablet (4 mg total) by mouth every 8 (eight) hours as needed for up to 10 doses for nausea or vomiting., Disp: 20 tablet, Rfl: 0 .  ondansetron (ZOFRAN) 8 MG tablet, Take 1 tablet (8 mg total) by mouth 2 (two) times daily as needed. Start on the third day after chemotherapy., Disp: 30 tablet, Rfl: 1 .  oxyCODONE (OXYCONTIN) 20 mg 12 hr tablet, Take 1 tablet (20 mg total) by mouth every 12 (twelve) hours., Disp: 60 tablet, Rfl: 0 .  Oxycodone HCl 10 MG TABS, Take 1 tablet (10 mg total) by mouth every 4 (four) hours as needed., Disp: 180 tablet, Rfl: 0 .  pantoprazole (PROTONIX) 20 MG tablet, Take 1 tablet (20 mg total) by mouth daily., Disp: 30 tablet, Rfl: 1 .  prochlorperazine (COMPAZINE) 10 MG tablet, Take 1 tablet (10 mg total) by mouth every 6 (six) hours as needed (Nausea or vomiting)., Disp: 30 tablet, Rfl: 1 .  sucralfate (CARAFATE) 1 g tablet, Take 1 tablet (1 g total) by mouth 3 (three) times daily before meals. Dissolve in4to 5 tbs warm water, swish &swallow, Disp: 90 tablet, Rfl: 6 .  fentaNYL (DURAGESIC) 12 MCG/HR, Place 1 patch onto the skin every 3 (three) days. (Patient not taking:  Reported on 07/12/2019), Disp: 10 patch, Rfl: 0 No current facility-administered medications for this visit.  Facility-Administered Medications Ordered in Other Visits:  .  0.9 %  sodium chloride infusion, , Intravenous, Continuous, Sindy Guadeloupe, MD, Stopped at 07/13/19 [phone]  Physical exam:  Vitals:   07/13/19 0844  BP: (!) 185/92  Pulse: (!) 103  Temp: 98.7 F (37.1 C)  TempSrc: Tympanic  Weight: 220 lb (99.8 kg)  Height: 5\' 9"  (1.753 m)   Physical Exam Constitutional:      General: He is not in acute distress. HENT:     Head: Normocephalic and atraumatic.     Mouth/Throat:     Comments: Grade 1 mucositis Eyes:     Pupils: Pupils are equal, round, and reactive to light.  Cardiovascular:     Rate and Rhythm: Normal rate and regular rhythm.     Heart sounds: Normal heart sounds.  Pulmonary:     Effort: Pulmonary effort is normal.     Breath sounds: Normal breath sounds.  Abdominal:     General: Bowel sounds are normal.     Palpations: Abdomen is soft.  Musculoskeletal:     Cervical back: Normal range of motion.  Skin:    General: Skin is warm and dry.  Neurological:     Mental Status: He is alert and oriented to person, place, and time.      CMP Latest Ref Rng & Units 07/13/2019  Glucose 70 - 99 mg/dL 128(H)  BUN 8 - 23 mg/dL 14  Creatinine 0.61 - 1.24 mg/dL 1.18  Sodium 135 - 145 mmol/L 133(L)  Potassium 3.5 - 5.1 mmol/L 4.1  Chloride 98 - 111 mmol/L 95(L)  CO2 22 - 32 mmol/L 28  Calcium 8.9 - 10.3 mg/dL 8.1(L)  Total Protein 6.5 - 8.1 g/dL -  Total Bilirubin 0.3 - 1.2 mg/dL -  Alkaline Phos 38 - 126 U/L -  AST 15 - 41 U/L -  ALT 0 - 44 U/L -   CBC Latest Ref Rng & Units 07/13/2019  WBC 4.0 - 10.5 K/uL 2.3(L)  Hemoglobin 13.0 - 17.0 g/dL 10.6(L)  Hematocrit 39.0 - 52.0 % 32.5(L)  Platelets 150 - 400 K/uL 48(L)     Assessment and plan- Patient is a 69 y.o. male with history of nasopharyngeal carcinoma stage II T2 N0 M0.   He is here for on treatment  assessment prior to cycle 6 of weekly cisplatin chemotherapy  Patient's white cell count is down to 2.3 today with an ANC of 1.8.  He has significant thrombocytopenia with a platelet count of 48.  I will therefore hold his chemotherapy today.  I will see him back in 1 week for cycle 6 of chemotherapy and if his counts tolerate he will receive chemotherapy the following week as well.  Neoplasm related pain: We have asked him to discontinue his fentanyl patch and he will be getting OxyContin 20 mg twice daily delivered today.  I have asked him to take off his fentanyl patch tonight and start using OxyContin from tomorrow morning.  Continue to use as needed oxycodone for pain  Hypomagnesemia: We will give 2 g of IV magnesium today  Chemo-induced thrombocytopenia: Hold chemotherapy today continue to monitor.  Hopefully counts will improve by next week  Nosebleeds: We have given him written instructions to also try as needed Afrin spray if bleeding is not controlled with conservative measures   Visit Diagnosis 1. Neoplasm related pain   2. Chemotherapy-induced thrombocytopenia   3. Hypomagnesemia      Dr. Randa Evens, MD, MPH [facility] [id number] 07/13/2019 12:33 PM

## 2019-07-13 NOTE — Telephone Encounter (Signed)
CONFIRMED AND SCREENED FOR 07-15-19 OV.

## 2019-07-14 ENCOUNTER — Telehealth: Payer: Self-pay | Admitting: *Deleted

## 2019-07-14 ENCOUNTER — Ambulatory Visit
Admission: RE | Admit: 2019-07-14 | Discharge: 2019-07-14 | Disposition: A | Discharge status: Home / Self Care | Payer: Medicare HMO | Attending: Oncology | Admitting: Oncology

## 2019-07-14 ENCOUNTER — Other Ambulatory Visit: Payer: Self-pay | Admitting: *Deleted

## 2019-07-14 ENCOUNTER — Other Ambulatory Visit: Payer: Self-pay

## 2019-07-14 ENCOUNTER — Ambulatory Visit: Payer: Medicare HMO

## 2019-07-14 ENCOUNTER — Ambulatory Visit: Payer: Medicare HMO | Attending: Internal Medicine

## 2019-07-14 ENCOUNTER — Encounter: Payer: Self-pay | Admitting: Oncology

## 2019-07-14 ENCOUNTER — Ambulatory Visit
Admission: RE | Admit: 2019-07-14 | Discharge: 2019-07-14 | Disposition: A | Discharge status: Home / Self Care | Payer: Medicare HMO | Source: Ambulatory Visit | Attending: Oncology | Admitting: Oncology

## 2019-07-14 ENCOUNTER — Inpatient Hospital Stay (HOSPITAL_BASED_OUTPATIENT_CLINIC_OR_DEPARTMENT_OTHER): Payer: Medicare HMO | Admitting: Oncology

## 2019-07-14 DIAGNOSIS — R509 Fever, unspecified: Secondary | ICD-10-CM

## 2019-07-14 DIAGNOSIS — R05 Cough: Secondary | ICD-10-CM

## 2019-07-14 DIAGNOSIS — Z20822 Contact with and (suspected) exposure to covid-19: Secondary | ICD-10-CM | POA: Diagnosis not present

## 2019-07-14 DIAGNOSIS — C119 Malignant neoplasm of nasopharynx, unspecified: Secondary | ICD-10-CM

## 2019-07-14 MED ORDER — LIDOCAINE VISCOUS HCL 2 % MT SOLN: 15.0000 mL | OROMUCOSAL | 0 refills | Status: DC | PRN

## 2019-07-14 NOTE — Telephone Encounter (Signed)
Ana called pt and let him know not to come and the team would get in touch with him. Dr. Janese Banks has asked symptom management to telephone call and triage him from his home due to fever. [name] is calling him today

## 2019-07-14 NOTE — Telephone Encounter (Signed)
I called pt at the request of jenny NP. Pt needs covid testing due to fever. I let pt know that he needs to call to [phone] and they will give pt a appt for covid testing. It is mon-fri 2 pm to 5 pm. Pt took the number down and he knows where [facility] is located and he will call me if he needs anything else

## 2019-07-14 NOTE — Progress Notes (Signed)
Virtual Visit via Telephone Note  I connected with [name] on 07/14/19 at 11:45 AM EST by telephone and verified that I am speaking with the correct person using two identifiers.  Location: Patient: Home Provider: Office  Symptom Management  Chief Complaint: "Fever"   I discussed the limitations, risks, security and privacy concerns of performing an evaluation and management service by telephone and the availability of in person appointments. I also discussed with the patient that there may be a patient responsible charge related to this service. The patient expressed understanding and agreed to proceed.  History of Present Illness: [name] is a 69 year old male who was recently diagnosed with nasopharyngeal carcinoma.  He has completed 5 cycles of weekly cisplatin.  Most recent treatment was held due to significant thrombocytopenia.  He also was found to be low in magnesium and was given 2 g of West magnesium while in clinic.  Additional concerns were grade 1 mucositis and nosebleeds.  He is currently taking Magic mouthwash and using Afrin sparingly with minimal relief of symptoms.  Today, patient reports developing a fever of 101.8.  Fever has been present since this morning, approximately 6 hours.  Symptoms have been gradually improving.  Symptoms that are associated with the fever include intermittent cough with no sputum production.  He denies any chest pain, abdominal pain or urinary concerns.  He has not tried anything for his symptoms.  He has acute mouth pain due to radiation and chemotherapy.  He is using Magic mouthwash with lidocaine and carafate with some relief.  He uses 10 mg oxycodone every 4 hours as needed for neoplasm related pain.  He has not been using his fentanyl patches and has not started his long-acting medication.  States "I want to wait till I need it".  Observations/Objective: Fever of unknown origin.  Reached out to Dr. Janese Banks to see how aggressive we  should be treating this fever.  Patient sounds relatively well on the telephone.  He is still able to eat and drink soft foods and thin liquids.  Patient does not feel weak or dizzy especially when standing.  Given he does have a mild cough and fever will get stat chest x-ray and COVID-19 testing.  This will be needed so he can return to the cancer center for daily radiation.  He is asking for an increase strength of the Magic mouthwash.  We will try viscous lidocaine 2% solution which he can add directly to the sores and/or tongue or to the Magic mouthwash.  We will hold off on antibiotic at this time if symptoms fail to improve over the next couple days.  Assessment and Plan: Stat Chest X-ray-pending Covid-19 testing-pending Rx viscous lidocaine 2%.   Follow Up Instructions: RTC daily for radiation RTC next week for repeat lab work and reconsideration of Cycle 6.   We will call patient with results of COVID-19 and chest x-ray.   I discussed the assessment and treatment plan with the patient. The patient was provided an opportunity to ask questions and all were answered. The patient agreed with the plan and demonstrated an understanding of the instructions.   The patient was advised to call back or seek an in-person evaluation if the symptoms worsen or if the condition fails to improve as anticipated.  I provided 25 minutes of non-face-to-face time during this encounter.   Jacquelin Hawking, NP

## 2019-07-15 ENCOUNTER — Ambulatory Visit: Payer: Medicare HMO

## 2019-07-15 ENCOUNTER — Other Ambulatory Visit: Payer: Self-pay | Admitting: *Deleted

## 2019-07-15 ENCOUNTER — Ambulatory Visit: Payer: Medicare HMO | Admitting: Nurse Practitioner

## 2019-07-15 DIAGNOSIS — C119 Malignant neoplasm of nasopharynx, unspecified: Secondary | ICD-10-CM

## 2019-07-15 LAB — NOVEL CORONAVIRUS, NAA: SARS-CoV-2, NAA: NOT DETECTED

## 2019-07-16 ENCOUNTER — Ambulatory Visit: Payer: Medicare HMO

## 2019-07-16 ENCOUNTER — Telehealth: Payer: Self-pay

## 2019-07-16 NOTE — Telephone Encounter (Signed)
LMOM FOR PATIENT TO RESCHEDULE CANCELLED APPT FOR PAPERWORK DUE TO NEGATIVE COVID RESULT. PLEASE MAKE AS A 30 MIN APPT FOR PERSONAL CARE SERVICES PAPERWORK.

## 2019-07-19 ENCOUNTER — Other Ambulatory Visit: Payer: Self-pay

## 2019-07-19 ENCOUNTER — Encounter: Payer: Self-pay | Admitting: Oncology

## 2019-07-19 ENCOUNTER — Ambulatory Visit
Admission: RE | Admit: 2019-07-19 | Discharge: 2019-07-19 | Disposition: A | Discharge status: Home / Self Care | Payer: Medicare HMO | Source: Ambulatory Visit | Attending: Radiation Oncology | Admitting: Radiation Oncology

## 2019-07-19 ENCOUNTER — Inpatient Hospital Stay: Payer: Medicare HMO

## 2019-07-19 ENCOUNTER — Ambulatory Visit: Payer: Medicare HMO | Admitting: Nurse Practitioner

## 2019-07-19 DIAGNOSIS — D701 Agranulocytosis secondary to cancer chemotherapy: Secondary | ICD-10-CM | POA: Diagnosis not present

## 2019-07-19 DIAGNOSIS — Z5189 Encounter for other specified aftercare: Secondary | ICD-10-CM | POA: Diagnosis not present

## 2019-07-19 DIAGNOSIS — R112 Nausea with vomiting, unspecified: Secondary | ICD-10-CM | POA: Diagnosis not present

## 2019-07-19 DIAGNOSIS — R42 Dizziness and giddiness: Secondary | ICD-10-CM | POA: Diagnosis not present

## 2019-07-19 DIAGNOSIS — N179 Acute kidney failure, unspecified: Secondary | ICD-10-CM | POA: Diagnosis not present

## 2019-07-19 DIAGNOSIS — R7989 Other specified abnormal findings of blood chemistry: Secondary | ICD-10-CM | POA: Diagnosis not present

## 2019-07-19 DIAGNOSIS — D6959 Other secondary thrombocytopenia: Secondary | ICD-10-CM | POA: Diagnosis not present

## 2019-07-19 DIAGNOSIS — Z5111 Encounter for antineoplastic chemotherapy: Secondary | ICD-10-CM | POA: Diagnosis not present

## 2019-07-19 DIAGNOSIS — Z510 Encounter for antineoplastic radiation therapy: Secondary | ICD-10-CM | POA: Diagnosis not present

## 2019-07-19 DIAGNOSIS — C119 Malignant neoplasm of nasopharynx, unspecified: Secondary | ICD-10-CM

## 2019-07-19 DIAGNOSIS — M5441 Lumbago with sciatica, right side: Secondary | ICD-10-CM

## 2019-07-19 LAB — CBC
HCT: 27.5 % — ABNORMAL LOW (ref 39.0–52.0)
Hemoglobin: 9.1 g/dL — ABNORMAL LOW (ref 13.0–17.0)
MCH: 30 pg (ref 26.0–34.0)
MCHC: 33.1 g/dL (ref 30.0–36.0)
MCV: 90.8 fL (ref 80.0–100.0)
Platelets: 110 10*3/uL — ABNORMAL LOW (ref 150–400)
RBC: 3.03 MIL/uL — ABNORMAL LOW (ref 4.22–5.81)
RDW: 13.1 % (ref 11.5–15.5)
WBC: 0.6 10*3/uL — CL (ref 4.0–10.5)
nRBC: 0 % (ref 0.0–0.2)

## 2019-07-19 NOTE — Progress Notes (Signed)
Patient stated that he had been doing well with no complaints. 

## 2019-07-19 NOTE — Progress Notes (Signed)
Nutrition Follow-up:  Patient with nasopharyngeal carcinoma. Patient receiving concurrent chemotherapy and radiation therapy.   Met with patient prior to radiation treatment.  Patient reports that he pulled a muscle cleaning out kitchen cabinets this weekend.  Reports that appetite is fair, no taste.  Reports that he had fish on Friday night and leftovers for breakfast the next morning.  Reports that he is eating pudding and applesauce.  Reports that he is using viscous lidocaine with some relief.      Medications: reviewed  Labs: reviewed  Anthropometrics:   Weight today in radiation 216 lb 1 oz decreased from 216 lb on 2/23.    215-220 lb since June 2020.  Reports weight gain during pandemic.     NUTRITION DIAGNOSIS: Predicted suboptimal energy intake continues    INTERVENTION:  Encouraged patient to continue with viscous lidocaine.   Encouraged soft moist foods high in calories and protein.       MONITORING, EVALUATION, GOAL: Patient will consume adequate calories and protein to prevent weight loss during treatment   NEXT VISIT: March 15th after treatment  Katheleen Stella B. Zenia Resides, [place], [place] Registered Dietitian [phone] (pager)

## 2019-07-20 ENCOUNTER — Inpatient Hospital Stay: Payer: Medicare HMO

## 2019-07-20 ENCOUNTER — Inpatient Hospital Stay (HOSPITAL_BASED_OUTPATIENT_CLINIC_OR_DEPARTMENT_OTHER): Payer: Medicare HMO | Admitting: Oncology

## 2019-07-20 ENCOUNTER — Ambulatory Visit
Admission: RE | Admit: 2019-07-20 | Discharge: 2019-07-20 | Disposition: A | Discharge status: Home / Self Care | Payer: Medicare HMO | Source: Ambulatory Visit | Attending: Radiation Oncology | Admitting: Radiation Oncology

## 2019-07-20 ENCOUNTER — Telehealth: Payer: Self-pay

## 2019-07-20 ENCOUNTER — Other Ambulatory Visit: Payer: Self-pay

## 2019-07-20 VITALS — BP 112/55 | HR 98 | Temp 97.8°F | Resp 16 | Wt 214.7 lb

## 2019-07-20 DIAGNOSIS — C119 Malignant neoplasm of nasopharynx, unspecified: Secondary | ICD-10-CM

## 2019-07-20 DIAGNOSIS — T451X5A Adverse effect of antineoplastic and immunosuppressive drugs, initial encounter: Secondary | ICD-10-CM | POA: Insufficient documentation

## 2019-07-20 DIAGNOSIS — Z5111 Encounter for antineoplastic chemotherapy: Secondary | ICD-10-CM | POA: Diagnosis not present

## 2019-07-20 DIAGNOSIS — Z5189 Encounter for other specified aftercare: Secondary | ICD-10-CM | POA: Diagnosis not present

## 2019-07-20 DIAGNOSIS — G893 Neoplasm related pain (acute) (chronic): Secondary | ICD-10-CM

## 2019-07-20 DIAGNOSIS — R112 Nausea with vomiting, unspecified: Secondary | ICD-10-CM | POA: Diagnosis not present

## 2019-07-20 DIAGNOSIS — D6959 Other secondary thrombocytopenia: Secondary | ICD-10-CM | POA: Diagnosis not present

## 2019-07-20 DIAGNOSIS — N179 Acute kidney failure, unspecified: Secondary | ICD-10-CM | POA: Diagnosis not present

## 2019-07-20 DIAGNOSIS — D701 Agranulocytosis secondary to cancer chemotherapy: Secondary | ICD-10-CM | POA: Diagnosis not present

## 2019-07-20 DIAGNOSIS — R7989 Other specified abnormal findings of blood chemistry: Secondary | ICD-10-CM | POA: Diagnosis not present

## 2019-07-20 DIAGNOSIS — R42 Dizziness and giddiness: Secondary | ICD-10-CM | POA: Diagnosis not present

## 2019-07-20 DIAGNOSIS — Z510 Encounter for antineoplastic radiation therapy: Secondary | ICD-10-CM | POA: Diagnosis not present

## 2019-07-20 LAB — CBC WITH DIFFERENTIAL/PLATELET
Abs Immature Granulocytes: 0 10*3/uL (ref 0.00–0.07)
Basophils Absolute: 0 10*3/uL (ref 0.0–0.1)
Basophils Relative: 0 %
Eosinophils Absolute: 0 10*3/uL (ref 0.0–0.5)
Eosinophils Relative: 2 %
HCT: 25.9 % — ABNORMAL LOW (ref 39.0–52.0)
Hemoglobin: 8.6 g/dL — ABNORMAL LOW (ref 13.0–17.0)
Immature Granulocytes: 0 %
Lymphocytes Relative: 51 %
Lymphs Abs: 0.3 10*3/uL — ABNORMAL LOW (ref 0.7–4.0)
MCH: 29.8 pg (ref 26.0–34.0)
MCHC: 33.2 g/dL (ref 30.0–36.0)
MCV: 89.6 fL (ref 80.0–100.0)
Monocytes Absolute: 0.1 10*3/uL (ref 0.1–1.0)
Monocytes Relative: 21 %
Neutro Abs: 0.1 10*3/uL — ABNORMAL LOW (ref 1.7–7.7)
Neutrophils Relative %: 26 %
Platelets: 146 10*3/uL — ABNORMAL LOW (ref 150–400)
RBC: 2.89 MIL/uL — ABNORMAL LOW (ref 4.22–5.81)
RDW: 13 % (ref 11.5–15.5)
WBC: 0.5 10*3/uL — CL (ref 4.0–10.5)
nRBC: 0 % (ref 0.0–0.2)

## 2019-07-20 LAB — BASIC METABOLIC PANEL
Anion gap: 12 (ref 5–15)
BUN: 13 mg/dL (ref 8–23)
CO2: 27 mmol/L (ref 22–32)
Calcium: 8.4 mg/dL — ABNORMAL LOW (ref 8.9–10.3)
Chloride: 95 mmol/L — ABNORMAL LOW (ref 98–111)
Creatinine, Ser: 1.17 mg/dL (ref 0.61–1.24)
GFR calc Af Amer: >60 mL/min (ref >60)
GFR calc non Af Amer: >60 mL/min (ref >60)
Glucose, Bld: 155 mg/dL — ABNORMAL HIGH (ref 70–99)
Potassium: 3.4 mmol/L — ABNORMAL LOW (ref 3.5–5.1)
Sodium: 134 mmol/L — ABNORMAL LOW (ref 135–145)

## 2019-07-20 LAB — MAGNESIUM: Magnesium: 1.7 mg/dL (ref 1.7–2.4)

## 2019-07-20 MED ORDER — HEPARIN SOD (PORK) LOCK FLUSH 100 UNIT/ML IV SOLN
500.0000 [IU] | Freq: Once | INTRAVENOUS | Status: AC
  Administered 2019-07-20: 500 [IU] via INTRAVENOUS
  Filled 2019-07-20: qty 5

## 2019-07-20 NOTE — Progress Notes (Signed)
Pt said he has some indigestion-took a pill otc but does not know what it is. It helped his sx.

## 2019-07-20 NOTE — Telephone Encounter (Signed)
CONFIRMED AND SCREENED FOR 07-22-19 OV.

## 2019-07-21 ENCOUNTER — Ambulatory Visit
Admission: RE | Admit: 2019-07-21 | Discharge: 2019-07-21 | Disposition: A | Discharge status: Home / Self Care | Payer: Medicare HMO | Source: Ambulatory Visit | Attending: Radiation Oncology | Admitting: Radiation Oncology

## 2019-07-21 ENCOUNTER — Other Ambulatory Visit: Payer: Self-pay

## 2019-07-21 DIAGNOSIS — Z510 Encounter for antineoplastic radiation therapy: Secondary | ICD-10-CM | POA: Diagnosis not present

## 2019-07-21 DIAGNOSIS — C119 Malignant neoplasm of nasopharynx, unspecified: Secondary | ICD-10-CM | POA: Diagnosis not present

## 2019-07-22 ENCOUNTER — Inpatient Hospital Stay: Payer: Medicare HMO

## 2019-07-22 ENCOUNTER — Ambulatory Visit
Admission: RE | Admit: 2019-07-22 | Discharge: 2019-07-22 | Disposition: A | Discharge status: Home / Self Care | Payer: Medicare HMO | Source: Ambulatory Visit | Attending: Radiation Oncology | Admitting: Radiation Oncology

## 2019-07-22 ENCOUNTER — Ambulatory Visit (INDEPENDENT_AMBULATORY_CARE_PROVIDER_SITE_OTHER): Payer: Medicare HMO | Admitting: Nurse Practitioner

## 2019-07-22 ENCOUNTER — Telehealth: Payer: Self-pay | Admitting: *Deleted

## 2019-07-22 ENCOUNTER — Encounter: Payer: Self-pay | Admitting: Nurse Practitioner

## 2019-07-22 VITALS — BP 125/66 | HR 97 | Temp 97.4°F | Resp 16 | Ht 69.0 in | Wt 215.0 lb

## 2019-07-22 DIAGNOSIS — D701 Agranulocytosis secondary to cancer chemotherapy: Secondary | ICD-10-CM | POA: Diagnosis not present

## 2019-07-22 DIAGNOSIS — C119 Malignant neoplasm of nasopharynx, unspecified: Secondary | ICD-10-CM

## 2019-07-22 DIAGNOSIS — Z5111 Encounter for antineoplastic chemotherapy: Secondary | ICD-10-CM | POA: Diagnosis not present

## 2019-07-22 DIAGNOSIS — Z0001 Encounter for general adult medical examination with abnormal findings: Secondary | ICD-10-CM

## 2019-07-22 DIAGNOSIS — R300 Dysuria: Secondary | ICD-10-CM | POA: Diagnosis not present

## 2019-07-22 DIAGNOSIS — Z510 Encounter for antineoplastic radiation therapy: Secondary | ICD-10-CM | POA: Diagnosis not present

## 2019-07-22 DIAGNOSIS — N179 Acute kidney failure, unspecified: Secondary | ICD-10-CM | POA: Diagnosis not present

## 2019-07-22 DIAGNOSIS — D6959 Other secondary thrombocytopenia: Secondary | ICD-10-CM | POA: Diagnosis not present

## 2019-07-22 DIAGNOSIS — I10 Essential (primary) hypertension: Secondary | ICD-10-CM

## 2019-07-22 DIAGNOSIS — R7989 Other specified abnormal findings of blood chemistry: Secondary | ICD-10-CM | POA: Diagnosis not present

## 2019-07-22 DIAGNOSIS — R112 Nausea with vomiting, unspecified: Secondary | ICD-10-CM | POA: Diagnosis not present

## 2019-07-22 DIAGNOSIS — Z5189 Encounter for other specified aftercare: Secondary | ICD-10-CM | POA: Diagnosis not present

## 2019-07-22 DIAGNOSIS — R531 Weakness: Secondary | ICD-10-CM | POA: Insufficient documentation

## 2019-07-22 DIAGNOSIS — R42 Dizziness and giddiness: Secondary | ICD-10-CM | POA: Diagnosis not present

## 2019-07-22 LAB — CBC WITH DIFFERENTIAL/PLATELET
Abs Immature Granulocytes: 0.03 10*3/uL (ref 0.00–0.07)
Basophils Absolute: 0 10*3/uL (ref 0.0–0.1)
Basophils Relative: 1 %
Eosinophils Absolute: 0 10*3/uL (ref 0.0–0.5)
Eosinophils Relative: 1 %
HCT: 26.9 % — ABNORMAL LOW (ref 39.0–52.0)
Hemoglobin: 8.9 g/dL — ABNORMAL LOW (ref 13.0–17.0)
Immature Granulocytes: 4 %
Lymphocytes Relative: 36 %
Lymphs Abs: 0.3 10*3/uL — ABNORMAL LOW (ref 0.7–4.0)
MCH: 30.1 pg (ref 26.0–34.0)
MCHC: 33.1 g/dL (ref 30.0–36.0)
MCV: 90.9 fL (ref 80.0–100.0)
Monocytes Absolute: 0.2 10*3/uL (ref 0.1–1.0)
Monocytes Relative: 21 %
Neutro Abs: 0.3 10*3/uL — ABNORMAL LOW (ref 1.7–7.7)
Neutrophils Relative %: 37 %
Platelets: 334 10*3/uL (ref 150–400)
RBC: 2.96 MIL/uL — ABNORMAL LOW (ref 4.22–5.81)
RDW: 13.4 % (ref 11.5–15.5)
WBC: 0.8 10*3/uL — CL (ref 4.0–10.5)
nRBC: 0 % (ref 0.0–0.2)

## 2019-07-22 MED ORDER — FILGRASTIM-SNDZ 480 MCG/0.8ML IJ SOSY
480.0000 ug | PREFILLED_SYRINGE | Freq: Every day | INTRAMUSCULAR | Status: DC
  Administered 2019-07-22: 480 ug via SUBCUTANEOUS
  Filled 2019-07-22: qty 0.8

## 2019-07-22 NOTE — Addendum Note (Signed)
Addended byWaynard Edwards on: 07/22/2019 04:21 PM   Modules accepted: Orders

## 2019-07-22 NOTE — Telephone Encounter (Signed)
1121-Received incoming msg from [name] in cancer center lab. Patient has critical anc of 0.3. chart reviewed patient to have zarzio today. Read back process performed with lab tech/on call md- Dr. Rogue Bussing informed.

## 2019-07-22 NOTE — Progress Notes (Signed)
[facility] [place], [place] [address]  Internal MEDICINE  Office Visit Note  Patient Name: [name]  [id number]  [id number]  Date of Service: 07/22/2019   Pt is here for routine health maintenance examination   Chief Complaint  Patient presents with  . Medicare Wellness  . Hypertension  . Depression  . PAPERWORK     The patient is here for health maintenance exam. He is currently undergoing treatment for nasopharyngeal cancer. He is scheduled to have seven chemotherapy treatments and 35 radiation treatments. He has completed six chemotherapy treatments and is in fourth week of radiation. Was unable to get chemotherapy last week as his platelets were too low. He is goig to have further labs today to recheck his counts. Radiation is five days per week for seven weeks. He is burned on both sides of his neck and posterior aspect of the neck. Mouth and tongue are also burned. He is very tired. Having a hard time completing activities of daily living at home doe to fatigue and weakness. Would like to apply for home aid to help with some of these activities. He is having trouble with cleaning his home. States that he is devloping "chemo brain." feels as though he is in fog, confused, more so at night. He has brought paperwork with him to fill our for this.  Blood pressure is well controlled.    Current Medication: Outpatient Encounter Medications as of 07/22/2019  Medication Sig  . albuterol (VENTOLIN HFA) 108 (90 Base) MCG/ACT inhaler Inhale 2 puffs into the lungs every 6 (six) hours as needed for wheezing or shortness of breath.  . ALPRAZolam (XANAX) 0.5 MG tablet Take 1 tablet (0.5 mg total) by mouth every 6 (six) hours as needed for anxiety.  Marland Kitchen amLODipine (NORVASC) 5 MG tablet Take 1 tablet (5 mg total) by mouth daily.  Marland Kitchen antiseptic oral rinse (BIOTENE) LIQD 15 mLs by Mouth Rinse route as needed for dry mouth.  Marland Kitchen aspirin EC 81 MG tablet Take 81  mg by mouth daily.  . cyclobenzaprine (FLEXERIL) 5 MG tablet Take 2 tablets (10 mg total) by mouth 2 (two) times daily as needed for muscle spasms.  Marland Kitchen dexamethasone (DECADRON) 2 MG tablet Take 1 tablet (2 mg total) by mouth 2 (two) times daily.  Marland Kitchen dexamethasone (DECADRON) 4 MG tablet Take 2 tablets by mouth once a day on the day after chemotherapy and then take 2 tablets two times a day for 2 days. Take with food.  . fentaNYL (DURAGESIC) 12 MCG/HR Place 1 patch onto the skin every 3 (three) days.  . fluconazole (DIFLUCAN) 100 MG tablet Take 1 tablet (100 mg total) by mouth daily.  Marland Kitchen gabapentin (NEURONTIN) 100 MG capsule Take 2 capsules (200 mg total) by mouth 3 (three) times daily.  . hydrOXYzine (ATARAX/VISTARIL) 25 MG tablet Take 1 to 2 tablets po QHS prn insomnia/anxiety  . lidocaine (XYLOCAINE) 2 % solution Use as directed 15 mLs in the mouth or throat as needed for mouth pain.  Marland Kitchen lidocaine-prilocaine (EMLA) cream Apply to affected area once  . magic mouthwash w/lidocaine SOLN Take 5 mLs by mouth 4 (four) times daily as needed for mouth pain.  Marland Kitchen MAGNESIUM PO Take by mouth.  . Multiple Vitamin (MULTIVITAMIN WITH MINERALS) TABS tablet Take 1 tablet by mouth daily.  . ondansetron (ZOFRAN) 4 MG tablet Take 1 tablet (4 mg total) by mouth every 8 (eight) hours as needed for up to 10 doses  for nausea or vomiting.  . ondansetron (ZOFRAN) 8 MG tablet Take 1 tablet (8 mg total) by mouth 2 (two) times daily as needed. Start on the third day after chemotherapy.  Marland Kitchen oxyCODONE (OXYCONTIN) 20 mg 12 hr tablet Take 1 tablet (20 mg total) by mouth every 12 (twelve) hours.  . Oxycodone HCl 10 MG TABS Take 1 tablet (10 mg total) by mouth every 4 (four) hours as needed.  . pantoprazole (PROTONIX) 20 MG tablet Take 1 tablet (20 mg total) by mouth daily.  . prochlorperazine (COMPAZINE) 10 MG tablet Take 1 tablet (10 mg total) by mouth every 6 (six) hours as needed (Nausea or vomiting).  . sucralfate (CARAFATE) 1 g  tablet Take 1 tablet (1 g total) by mouth 3 (three) times daily before meals. Dissolve in4to 5 tbs warm water, swish &swallow  . [DISCONTINUED] clotrimazole (MYCELEX) 10 MG troche   . [DISCONTINUED] cyclobenzaprine (FLEXERIL) 5 MG tablet Take 1 tablet (5 mg total) by mouth 2 (two) times daily as needed for muscle spasms. (Patient not taking: Reported on 04/22/2019)  . [DISCONTINUED] fluconazole (DIFLUCAN) 150 MG tablet Take 1 tablet po once weekly (Patient not taking: Reported on 04/22/2019)  . [DISCONTINUED] itraconazole (SPORANOX) 10 MG/ML solution Take 20 mLs (200 mg total) by mouth daily. (Patient not taking: Reported on 04/22/2019)  . [DISCONTINUED] nystatin (MYCOSTATIN) 100000 UNIT/ML suspension Take 5 mLs (500,000 Units total) by mouth 4 (four) times daily. (Patient not taking: Reported on 04/22/2019)  . [DISCONTINUED] Oreg-Peppermint-Thyme-Gldnseal (YEAST FORMULA PO) Take by mouth. Yeast cleanse  6 pills a day  . [DISCONTINUED] rOPINIRole (REQUIP) 1 MG tablet Take 1 tablet (1 mg total) by mouth at bedtime. (Patient not taking: Reported on 05/10/2019)  . [DISCONTINUED] triamcinolone (KENALOG) 0.025 % cream Apply 1 application topically 2 (two) times daily. (Patient not taking: Reported on 05/10/2019)   No facility-administered encounter medications on file as of 07/22/2019.    Surgical History: Past Surgical History:  Procedure Laterality Date  . COLONOSCOPY WITH PROPOFOL N/A 12/16/2016   Procedure: COLONOSCOPY WITH PROPOFOL;  Surgeon: Lollie Sails, MD;  Location: [facility] ENDOSCOPY;  Service: Endoscopy;  Laterality: N/A;  . COLONOSCOPY WITH PROPOFOL N/A 03/14/2017   Procedure: COLONOSCOPY WITH PROPOFOL;  Surgeon: Lollie Sails, MD;  Location: [facility] ENDOSCOPY;  Service: Endoscopy;  Laterality: N/A;  . FOOT SURGERY Right 03/12/2002  . HEMORRHOID SURGERY    . LITHOTRIPSY    . MYRINGOTOMY WITH TUBE PLACEMENT Left 04/28/2019   Procedure: MYRINGOTOMY WITH TUBE PLACEMENT;  Surgeon: Carloyn Manner, MD;  Location: [facility];  Service: ENT;  Laterality: Left;  . NASOPHARYNGOSCOPY N/A 04/28/2019   Procedure: NASOPHARYNGOSCOPY WITH BIOPSY;  Surgeon: Carloyn Manner, MD;  Location: [facility];  Service: ENT;  Laterality: N/A;  . PORTA CATH INSERTION N/A 05/20/2019   Procedure: PORTA CATH INSERTION;  Surgeon: Algernon Huxley, MD;  Location: [facility] CV LAB;  Service: Cardiovascular;  Laterality: N/A;  . TONSILLECTOMY      Medical History: Past Medical History:  Diagnosis Date  . Alcohol abuse   . Benzodiazepine dependence ([place])   . Benzodiazepine withdrawal ([place])   . Chronic pain in right foot   . COPD (chronic obstructive pulmonary disease) ([place])   . Depression 08/19/2017  . Hepatitis C   . Hypertension   . 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[place])   . Seizures ([place]) 2010  . Sleep apnea   . Syncope    questionable vasovagal    Family History: Family History  Problem  Relation Age of Onset  . Arthritis Mother   . Hypertension Mother   . Cancer Father   . Kidney disease Father   . Alcohol abuse Paternal Aunt   . Depression Maternal Grandfather       Review of Systems  Constitutional: Positive for activity change and fatigue. Negative for chills and unexpected weight change.       Has become fatigue and weak due to chemotherapy and radiation treatments he is going through.   HENT: Positive for mouth sores. Negative for congestion, rhinorrhea, sneezing and sore throat.        The patient has mouth sores due to chemotherapy and radiation treatment for nasopharyngeal .  Respiratory: Negative for cough, chest tightness, shortness of breath and wheezing.   Cardiovascular: Negative for chest pain and palpitations.  Gastrointestinal: Negative for abdominal pain, constipation, diarrhea, nausea and vomiting.  Endocrine: Negative for cold intolerance, heat intolerance, polydipsia and polyuria.  Genitourinary: Negative for dysuria and frequency.  Musculoskeletal:  Positive for arthralgias. Negative for back pain, joint swelling and neck pain.         Bilateral foot pain.   Skin: Negative for rash.       Skin on both sides of neck and posterior aspect of neck burned from radiation treatment .  Allergic/Immunologic: Negative for environmental allergies.  Neurological: Negative for dizziness, tremors, numbness and headaches.  Hematological: Negative for adenopathy. Does not bruise/bleed easily.  Psychiatric/Behavioral: Negative for behavioral problems (Depression), sleep disturbance and suicidal ideas. The patient is nervous/anxious.    Today's Vitals   07/22/19 0856  BP: 125/66  Pulse: 97  Resp: 16  Temp: (!) 97.4 F (36.3 C)  SpO2: 95%  Weight: 215 lb (97.5 kg)  Height: 5\' 9"  (1.753 m)   Body mass index is 31.75 kg/m.   Physical Exam Vitals and nursing note reviewed.  Constitutional:      General: He is not in acute distress.    Appearance: Normal appearance. He is well-developed. He is not diaphoretic.  HENT:     Head: Normocephalic and atraumatic.     Mouth/Throat:     Mouth: Oral lesions present.     Pharynx: No oropharyngeal exudate.  Eyes:     Pupils: Pupils are equal, round, and reactive to light.  Neck:     Thyroid: No thyromegaly.     Vascular: No carotid bruit or JVD.     Trachea: No tracheal deviation.     Comments: Burned skin to the neck. Red and tender. No second degree lesions or blistering present today.  Cardiovascular:     Rate and Rhythm: Normal rate and regular rhythm.     Pulses: Normal pulses.     Heart sounds: Normal heart sounds. No murmur. No friction rub. No gallop.      Comments: Intermittent atopic beats present.  Pulmonary:     Effort: Pulmonary effort is normal. No respiratory distress.     Breath sounds: Normal breath sounds. No wheezing or rales.  Chest:     Chest wall: No tenderness.  Abdominal:     General: Bowel sounds are normal.     Palpations: Abdomen is soft.     Tenderness: There is no  abdominal tenderness.  Musculoskeletal:        General: Normal range of motion.     Cervical back: Normal range of motion and neck supple.  Lymphadenopathy:     Cervical: No cervical adenopathy.  Skin:    General: Skin is warm  and dry.  Neurological:     General: No focal deficit present.     Mental Status: He is alert and oriented to person, place, and time.     Cranial Nerves: No cranial nerve deficit.  Psychiatric:        Behavior: Behavior normal.        Thought Content: Thought content normal.        Judgment: Judgment normal.    Depression screen [facility] 2/9 07/22/2019 04/30/2019 10/29/2018 10/23/2018 09/01/2018  Decreased Interest 0 0 0 0 0  Down, Depressed, Hopeless 0 0 0 0 0  PHQ - 2 Score 0 0 0 0 0  Altered sleeping - - - - -  Tired, decreased energy - - - - -  Change in appetite - - - - -  Feeling bad or failure about yourself  - - - - -  Trouble concentrating - - - - -  Moving slowly or fidgety/restless - - - - -  Suicidal thoughts - - - - -  PHQ-9 Score - - - - -    Functional Status Survey: Is the patient deaf or have difficulty hearing?: No Does the patient have difficulty seeing, even when wearing glasses/contacts?: No Does the patient have difficulty concentrating, remembering, or making decisions?: No Does the patient have difficulty walking or climbing stairs?: No Does the patient have difficulty dressing or bathing?: No Does the patient have difficulty doing errands alone such as visiting a doctor's office or shopping?: No  MMSE - [place] Exam 07/22/2019 07/20/2018  Orientation to time 5 5  Orientation to Place 5 5  Registration 3 3  Attention/ Calculation 5 5  Recall 3 3  Language- name 2 objects 2 2  Language- repeat 1 1  Language- follow 3 step command 3 3  Language- read & follow direction 1 1  Write a sentence 1 1  Copy design 1 1  Total score 30 30    Fall Risk  07/22/2019 04/30/2019 10/29/2018 10/23/2018 09/01/2018  Falls in the past year?  0 0 0 0 0  Number falls in past yr: - - - - -  Injury with Fall? - - - - -       Assessment/Plan: 1. Encounter for general adult medical examination with abnormal findings Annual health maintenance exam today.   2. 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 Getting chemotherapy weekly at this time.   3. Weakness Will fill out forms to get patient home health aid. Weakness making it difficult for him to complete routine ADLs  4. Essential hypertension Stable. Continue bp medication as prescribed   5. Dysuria - UA/M w/rflx Culture, Routine  General Counseling: [place] verbalizes understanding of the findings of todays visit and agrees with plan of treatment. I have discussed any further diagnostic evaluation that may be needed or ordered today. We also reviewed his medications today. he has been encouraged to call the office with any questions or concerns that should arise related to todays visit.    Counseling:  This patient was seen by Leretha Pol FNP Collaboration with Dr Lavera Guise as a part of collaborative care agreement  Orders Placed This Encounter  Procedures  . UA/M w/rflx Culture, Routine      Total time spent: 29 Minutes  Time spent includes review of chart, medications, test results, and follow up plan with the patient.     Lavera Guise, MD  Internal Medicine

## 2019-07-23 ENCOUNTER — Ambulatory Visit
Admission: RE | Admit: 2019-07-23 | Discharge: 2019-07-23 | Disposition: A | Discharge status: Home / Self Care | Payer: Medicare HMO | Source: Ambulatory Visit | Attending: Radiation Oncology | Admitting: Radiation Oncology

## 2019-07-23 DIAGNOSIS — Z510 Encounter for antineoplastic radiation therapy: Secondary | ICD-10-CM | POA: Diagnosis not present

## 2019-07-23 DIAGNOSIS — C119 Malignant neoplasm of nasopharynx, unspecified: Secondary | ICD-10-CM | POA: Diagnosis not present

## 2019-07-25 NOTE — Progress Notes (Signed)
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 Pryor Ochoa for symptoms of ear pain and discharge. Patient also noticed associated hearing loss and tinnitus. Patient was noted to have erythema and edema in the left sphenopalatine fossa as well as an exophytic mass on NPL exam. CT soft tissue of the neck showed asymmetric soft tissue within the left nasopharynx measuring 2.4 x 2 x 3.2 cm. There is lateral bulging with encroachment upon the left parapharyngeal space without frank infiltration. No extension into oropharynx or nasal cavity. No skull base infiltration. No appreciable mass within the larynx. No pathologically enlarged cervical lymph nodes. 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 Has ongoing fatigue. Denies other complaints  ECOG PS- 1 Pain scale- 6 Opioid associated constipation- no  Review of systems- Review of Systems  Constitutional: Positive for malaise/fatigue. Negative for chills, fever and weight loss.  HENT: Negative for congestion, ear discharge and nosebleeds.   Eyes: Negative for blurred vision.  Respiratory: Negative for cough,  hemoptysis, sputum production, shortness of breath and wheezing.   Cardiovascular: Negative for chest pain, palpitations, orthopnea and claudication.  Gastrointestinal: Negative for abdominal pain, blood in stool, constipation, diarrhea, heartburn, melena, nausea and vomiting.       Pain with swallowing  Genitourinary: Negative for dysuria, flank pain, frequency, hematuria and urgency.  Musculoskeletal: Negative for back pain, joint pain and myalgias.  Skin: Negative for rash.  Neurological: Negative for dizziness, tingling, focal weakness, seizures, weakness and headaches.  Endo/Heme/Allergies: Does not bruise/bleed easily.  Psychiatric/Behavioral: Negative for depression and suicidal ideas. The patient does not have insomnia.       Allergies  Allergen Reactions  . Opana [Oxymorphone Hcl]     Made him BLACKOUT  . Amoxicillin Other (See Comments)  . Oxymorphone Other (See Comments)  . Sulfa Antibiotics Other (See Comments)  . Sulfur     Childhood reaction      Past Medical History:  Diagnosis Date  . Alcohol abuse   . Benzodiazepine dependence ([place])   . Benzodiazepine withdrawal ([place])   . Chronic pain in right foot   . COPD (chronic obstructive pulmonary disease) ([place])   . Depression 08/19/2017  . Hepatitis C   . Hypertension   . 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[place])   . Seizures ([place]) 2010  . Sleep apnea   . Syncope    questionable vasovagal     Past Surgical History:  Procedure Laterality Date  . COLONOSCOPY WITH PROPOFOL N/A 12/16/2016   Procedure: COLONOSCOPY WITH PROPOFOL;  Surgeon: Lollie Sails, MD;  Location: [facility] ENDOSCOPY;  Service: Endoscopy;  Laterality: N/A;  . COLONOSCOPY WITH PROPOFOL N/A 03/14/2017   Procedure: COLONOSCOPY WITH PROPOFOL;  Surgeon: Lollie Sails, MD;  Location: [facility] ENDOSCOPY;  Service: Endoscopy;  Laterality: N/A;  . FOOT SURGERY Right 03/12/2002  . HEMORRHOID SURGERY    . LITHOTRIPSY    . MYRINGOTOMY WITH TUBE PLACEMENT Left 04/28/2019     Procedure: MYRINGOTOMY WITH TUBE PLACEMENT;  Surgeon: Carloyn Manner, MD;  Location: [place];  Service: ENT;  Laterality: Left;  . NASOPHARYNGOSCOPY N/A 04/28/2019   Procedure: NASOPHARYNGOSCOPY WITH BIOPSY;  Surgeon: Carloyn Manner, MD;  Location: [place];  Service: ENT;  Laterality: N/A;  . PORTA CATH INSERTION N/A 05/20/2019   Procedure: PORTA CATH INSERTION;  Surgeon: Algernon Huxley, MD;  Location: [place] CV LAB;  Service: Cardiovascular;  Laterality: N/A;  . TONSILLECTOMY      Social History   Socioeconomic History  . Marital status: Divorced    Spouse name: Not on file  . Number of children: Not on file  . Years of education: Not on file  . Highest education level: Not on file  Occupational History  . Not on file  Tobacco Use  . Smoking status: Former Smoker    Packs/day: 0.50    Years: 50.00    Pack years: 25.00    Types: Cigarettes    Quit date: 02/05/2019    Years since quitting: 0.4  . Smokeless tobacco: Never Used  Substance and Sexual Activity  . Alcohol use: Not Currently    Alcohol/week: 7.0 standard drinks    Types: 7 Standard drinks or equivalent per week  . Drug use: Not Currently  . Sexual activity: Not Currently  Other Topics Concern  . Not on file  Social History Narrative  . Not on file   Social Determinants of Health   Financial Resource Strain:   . Difficulty of Paying Living Expenses:   Food Insecurity: No Food Insecurity  . Worried About Charity fundraiser in the Last Year: Never true  . Ran Out of Food in the Last Year: Never true  Transportation Needs: No Transportation Needs  . Lack of Transportation (Medical): No  . Lack of Transportation (Non-Medical): No  Physical Activity: Inactive  . Days of Exercise per Week: 0 days  . Minutes of Exercise per Session: 0 min  Stress:   . Feeling of Stress :   Social Connections:   . Frequency of Communication with Friends and Family:   . Frequency of Social  Gatherings with Friends and Family:   . Attends Religious Services:   . Active Member of Clubs or Organizations:   . Attends Archivist Meetings:   Marland Kitchen Marital Status:   Intimate Partner Violence: Not At Risk  . Fear of Current or Ex-Partner: No  . Emotionally Abused: No  . Physically Abused: No  . Sexually Abused: No    Family History  Problem Relation Age of Onset  . Arthritis Mother   . Hypertension Mother   . Cancer Father   . Kidney disease Father   . Alcohol abuse Paternal Aunt   . Depression Maternal Grandfather      Current Outpatient Medications:  .  albuterol (VENTOLIN HFA) 108 (90 Base) MCG/ACT inhaler, Inhale 2 puffs into the lungs every 6 (six) hours as needed for wheezing or shortness of breath., Disp: 18 g, Rfl: 3 .  ALPRAZolam (XANAX) 0.5 MG tablet, Take 1 tablet (0.5 mg total) by mouth every 6 (six) hours as needed for anxiety., Disp: 60 tablet, Rfl: 0 .  amLODipine (NORVASC) 5 MG tablet, Take 1 tablet (5 mg total) by mouth daily., Disp: 90  tablet, Rfl: 3 .  antiseptic oral rinse (BIOTENE) LIQD, 15 mLs by Mouth Rinse route as needed for dry mouth., Disp: , Rfl:  .  aspirin EC 81 MG tablet, Take 81 mg by mouth daily., Disp: , Rfl:  .  cyclobenzaprine (FLEXERIL) 5 MG tablet, Take 2 tablets (10 mg total) by mouth 2 (two) times daily as needed for muscle spasms., Disp: 45 tablet, Rfl: 1 .  dexamethasone (DECADRON) 2 MG tablet, Take 1 tablet (2 mg total) by mouth 2 (two) times daily., Disp: 60 tablet, Rfl: 1 .  dexamethasone (DECADRON) 4 MG tablet, Take 2 tablets by mouth once a day on the day after chemotherapy and then take 2 tablets two times a day for 2 days. Take with food., Disp: 30 tablet, Rfl: 1 .  fentaNYL (DURAGESIC) 12 MCG/HR, Place 1 patch onto the skin every 3 (three) days., Disp: 10 patch, Rfl: 0 .  fluconazole (DIFLUCAN) 100 MG tablet, Take 1 tablet (100 mg total) by mouth daily., Disp: 7 tablet, Rfl: 0 .  gabapentin (NEURONTIN) 100 MG capsule,  Take 2 capsules (200 mg total) by mouth 3 (three) times daily., Disp: 180 capsule, Rfl: 3 .  hydrOXYzine (ATARAX/VISTARIL) 25 MG tablet, Take 1 to 2 tablets po QHS prn insomnia/anxiety, Disp: 60 tablet, Rfl: 2 .  lidocaine (XYLOCAINE) 2 % solution, Use as directed 15 mLs in the mouth or throat as needed for mouth pain., Disp: 100 mL, Rfl: 0 .  lidocaine-prilocaine (EMLA) cream, Apply to affected area once, Disp: 30 g, Rfl: 3 .  magic mouthwash w/lidocaine SOLN, Take 5 mLs by mouth 4 (four) times daily as needed for mouth pain., Disp: 240 mL, Rfl: 3 .  MAGNESIUM PO, Take by mouth., Disp: , Rfl:  .  Multiple Vitamin (MULTIVITAMIN WITH MINERALS) TABS tablet, Take 1 tablet by mouth daily., Disp: , Rfl:  .  ondansetron (ZOFRAN) 4 MG tablet, Take 1 tablet (4 mg total) by mouth every 8 (eight) hours as needed for up to 10 doses for nausea or vomiting., Disp: 20 tablet, Rfl: 0 .  ondansetron (ZOFRAN) 8 MG tablet, Take 1 tablet (8 mg total) by mouth 2 (two) times daily as needed. Start on the third day after chemotherapy., Disp: 30 tablet, Rfl: 1 .  oxyCODONE (OXYCONTIN) 20 mg 12 hr tablet, Take 1 tablet (20 mg total) by mouth every 12 (twelve) hours., Disp: 60 tablet, Rfl: 0 .  Oxycodone HCl 10 MG TABS, Take 1 tablet (10 mg total) by mouth every 4 (four) hours as needed., Disp: 180 tablet, Rfl: 0 .  pantoprazole (PROTONIX) 20 MG tablet, Take 1 tablet (20 mg total) by mouth daily., Disp: 30 tablet, Rfl: 1 .  prochlorperazine (COMPAZINE) 10 MG tablet, Take 1 tablet (10 mg total) by mouth every 6 (six) hours as needed (Nausea or vomiting)., Disp: 30 tablet, Rfl: 1 .  sucralfate (CARAFATE) 1 g tablet, Take 1 tablet (1 g total) by mouth 3 (three) times daily before meals. Dissolve in4to 5 tbs warm water, swish &swallow, Disp: 90 tablet, Rfl: 6  Physical exam:  Vitals:   07/20/19 0937 07/20/19 0938  BP: (!) 112/55   Pulse: 98   Resp: 16   Temp:  97.8 F (36.6 C)  TempSrc: Tympanic Tympanic  Weight: 214 lb  11.2 oz (97.4 kg)    Physical Exam Constitutional:      General: He is not in acute distress. HENT:     Head: Normocephalic and atraumatic.     Mouth/Throat:  Comments: Grade 1 mucositis Eyes:     Pupils: Pupils are equal, round, and reactive to light.  Cardiovascular:     Rate and Rhythm: Regular rhythm. Tachycardia present.     Heart sounds: Normal heart sounds.  Pulmonary:     Effort: Pulmonary effort is normal.     Breath sounds: Normal breath sounds.  Abdominal:     General: Bowel sounds are normal.     Palpations: Abdomen is soft.  Musculoskeletal:     Cervical back: Normal range of motion.  Skin:    General: Skin is warm and dry.  Neurological:     Mental Status: He is alert and oriented to person, place, and time.      CMP Latest Ref Rng & Units 07/20/2019  Glucose 70 - 99 mg/dL 155(H)  BUN 8 - 23 mg/dL 13  Creatinine 0.61 - 1.24 mg/dL 1.17  Sodium 135 - 145 mmol/L 134(L)  Potassium 3.5 - 5.1 mmol/L 3.4(L)  Chloride 98 - 111 mmol/L 95(L)  CO2 22 - 32 mmol/L 27  Calcium 8.9 - 10.3 mg/dL 8.4(L)  Total Protein 6.5 - 8.1 g/dL -  Total Bilirubin 0.3 - 1.2 mg/dL -  Alkaline Phos 38 - 126 U/L -  AST 15 - 41 U/L -  ALT 0 - 44 U/L -   CBC Latest Ref Rng & Units 07/22/2019  WBC 4.0 - 10.5 K/uL 0.8(LL)  Hemoglobin 13.0 - 17.0 g/dL 8.9(L)  Hematocrit 39.0 - 52.0 % 26.9(L)  Platelets 150 - 400 K/uL 334    No images are attached to the encounter.  DG Chest 2 View  Result Date: 07/14/2019 CLINICAL DATA:  Pt woke up with fever, chills and congestion this morning. History of throat cancer, porta cath 05-20-2019, COPD, HTN, former smoker. shieldedfever EXAM: CHEST - 2 VIEW COMPARISON:  None. FINDINGS: Normal mediastinum and cardiac silhouette. Normal pulmonary vasculature. No evidence of effusion, infiltrate, or pneumothorax. No acute bony abnormality. Port in the anterior chest wall with tip in distal SVC. IMPRESSION: No acute cardiopulmonary process. Electronically Signed    By: Suzy Bouchard M.D.   On: 07/14/2019 14:41     Assessment and plan- Patient is a 69 y.o. male  with history of nasopharyngeal carcinoma stage II T2 N0 M0. He is here for on treatment assessment prior to cycle 6 of weekly cisplatin chemotherapy  Patient did not receive cycle 6 of cisplatin chemotherapy last week as his platelet counts were down to 48.  Today his platelet counts have normalized to 334 however his white cell count remains low at 0.9 with an ANC of 0.1.  I will therefore continue to hold his chemotherapy today but plan to give him zarxio injection tomorrow and day after.  We will repeat his CBC on 07/22/2019 and will also offer him her dose of zarxio on 3/11 based on his counts  I will see him back in1  Week for cycle 7 of cisplatin with labs  Neoplasm related pain: continue oxycontin and prn oxycodone   Visit Diagnosis 1. Nasopharyngeal carcinoma ([place])   2. Chemotherapy induced neutropenia (H[facility])   3. Neoplasm related pain      Dr. Randa Evens, MD, MPH [facility] [id number] 07/25/2019 7:59 PM

## 2019-07-26 ENCOUNTER — Ambulatory Visit: Payer: Medicare HMO

## 2019-07-26 ENCOUNTER — Inpatient Hospital Stay: Payer: Medicare HMO

## 2019-07-26 NOTE — Progress Notes (Signed)
Nutrition  Radiation treatment was cancelled for today.  RD had planned to meet with patient following radiation treatment. Will follow-up as able.   Eller Sweis B. Zenia Resides, [place], [place] Registered Dietitian [phone] (pager)

## 2019-07-27 ENCOUNTER — Ambulatory Visit
Admission: RE | Admit: 2019-07-27 | Discharge: 2019-07-27 | Disposition: A | Discharge status: Home / Self Care | Payer: Medicare HMO | Source: Ambulatory Visit | Attending: Radiation Oncology | Admitting: Radiation Oncology

## 2019-07-27 ENCOUNTER — Inpatient Hospital Stay (HOSPITAL_BASED_OUTPATIENT_CLINIC_OR_DEPARTMENT_OTHER): Payer: Medicare HMO | Admitting: Oncology

## 2019-07-27 ENCOUNTER — Encounter: Payer: Self-pay | Admitting: Oncology

## 2019-07-27 ENCOUNTER — Inpatient Hospital Stay: Payer: Medicare HMO

## 2019-07-27 ENCOUNTER — Ambulatory Visit: Payer: Medicare HMO

## 2019-07-27 ENCOUNTER — Other Ambulatory Visit: Payer: Self-pay

## 2019-07-27 VITALS — BP 138/75 | HR 104 | Temp 97.8°F | Ht 69.0 in | Wt 208.0 lb

## 2019-07-27 DIAGNOSIS — R7989 Other specified abnormal findings of blood chemistry: Secondary | ICD-10-CM | POA: Diagnosis not present

## 2019-07-27 DIAGNOSIS — Z5111 Encounter for antineoplastic chemotherapy: Secondary | ICD-10-CM

## 2019-07-27 DIAGNOSIS — C119 Malignant neoplasm of nasopharynx, unspecified: Secondary | ICD-10-CM

## 2019-07-27 DIAGNOSIS — D6959 Other secondary thrombocytopenia: Secondary | ICD-10-CM | POA: Diagnosis not present

## 2019-07-27 DIAGNOSIS — Z5189 Encounter for other specified aftercare: Secondary | ICD-10-CM | POA: Diagnosis not present

## 2019-07-27 DIAGNOSIS — T451X5A Adverse effect of antineoplastic and immunosuppressive drugs, initial encounter: Secondary | ICD-10-CM

## 2019-07-27 DIAGNOSIS — Z510 Encounter for antineoplastic radiation therapy: Secondary | ICD-10-CM | POA: Diagnosis not present

## 2019-07-27 DIAGNOSIS — D701 Agranulocytosis secondary to cancer chemotherapy: Secondary | ICD-10-CM

## 2019-07-27 DIAGNOSIS — R112 Nausea with vomiting, unspecified: Secondary | ICD-10-CM | POA: Diagnosis not present

## 2019-07-27 DIAGNOSIS — R42 Dizziness and giddiness: Secondary | ICD-10-CM | POA: Diagnosis not present

## 2019-07-27 DIAGNOSIS — Z95828 Presence of other vascular implants and grafts: Secondary | ICD-10-CM

## 2019-07-27 DIAGNOSIS — N179 Acute kidney failure, unspecified: Secondary | ICD-10-CM | POA: Diagnosis not present

## 2019-07-27 LAB — BASIC METABOLIC PANEL
Anion gap: 12 (ref 5–15)
BUN: 8 mg/dL (ref 8–23)
CO2: 26 mmol/L (ref 22–32)
Calcium: 8.4 mg/dL — ABNORMAL LOW (ref 8.9–10.3)
Chloride: 98 mmol/L (ref 98–111)
Creatinine, Ser: 1.19 mg/dL (ref 0.61–1.24)
GFR calc Af Amer: >60 mL/min (ref >60)
GFR calc non Af Amer: >60 mL/min (ref >60)
Glucose, Bld: 128 mg/dL — ABNORMAL HIGH (ref 70–99)
Potassium: 3.4 mmol/L — ABNORMAL LOW (ref 3.5–5.1)
Sodium: 136 mmol/L (ref 135–145)

## 2019-07-27 LAB — CBC WITH DIFFERENTIAL/PLATELET
Abs Immature Granulocytes: 1 10*3/uL — ABNORMAL HIGH (ref 0.00–0.07)
Basophils Absolute: 0 10*3/uL (ref 0.0–0.1)
Basophils Relative: 0 %
Eosinophils Absolute: 0 10*3/uL (ref 0.0–0.5)
Eosinophils Relative: 0 %
HCT: 30.2 % — ABNORMAL LOW (ref 39.0–52.0)
Hemoglobin: 10.1 g/dL — ABNORMAL LOW (ref 13.0–17.0)
Immature Granulocytes: 17 %
Lymphocytes Relative: 12 %
Lymphs Abs: 0.7 10*3/uL (ref 0.7–4.0)
MCH: 29.9 pg (ref 26.0–34.0)
MCHC: 33.4 g/dL (ref 30.0–36.0)
MCV: 89.3 fL (ref 80.0–100.0)
Monocytes Absolute: 1.2 10*3/uL — ABNORMAL HIGH (ref 0.1–1.0)
Monocytes Relative: 21 %
Neutro Abs: 2.8 10*3/uL (ref 1.7–7.7)
Neutrophils Relative %: 50 %
Platelets: 567 10*3/uL — ABNORMAL HIGH (ref 150–400)
RBC: 3.38 MIL/uL — ABNORMAL LOW (ref 4.22–5.81)
RDW: 15.3 % (ref 11.5–15.5)
WBC: 5.8 10*3/uL (ref 4.0–10.5)
nRBC: 0.5 % — ABNORMAL HIGH (ref 0.0–0.2)

## 2019-07-27 LAB — MAGNESIUM: Magnesium: 1.4 mg/dL — ABNORMAL LOW (ref 1.7–2.4)

## 2019-07-27 MED ORDER — POTASSIUM CHLORIDE 2 MEQ/ML IV SOLN
Freq: Once | INTRAVENOUS | Status: AC
  Filled 2019-07-27: qty 1000

## 2019-07-27 MED ORDER — SODIUM CHLORIDE 0.9 % IV SOLN
10.0000 mg | Freq: Once | INTRAVENOUS | Status: AC
  Administered 2019-07-27: 10 mg via INTRAVENOUS
  Filled 2019-07-27: qty 10

## 2019-07-27 MED ORDER — FAMOTIDINE IN NACL 20-0.9 MG/50ML-% IV SOLN
20.0000 mg | Freq: Once | INTRAVENOUS | Status: AC
  Administered 2019-07-27: 20 mg via INTRAVENOUS
  Filled 2019-07-27: qty 50

## 2019-07-27 MED ORDER — SODIUM CHLORIDE 0.9 % IV SOLN
Freq: Once | INTRAVENOUS | Status: AC
  Filled 2019-07-27: qty 250

## 2019-07-27 MED ORDER — PALONOSETRON HCL INJECTION 0.25 MG/5ML
0.2500 mg | Freq: Once | INTRAVENOUS | Status: AC
  Administered 2019-07-27: 0.25 mg via INTRAVENOUS
  Filled 2019-07-27: qty 5

## 2019-07-27 MED ORDER — SODIUM CHLORIDE 0.9 % IV SOLN
150.0000 mg | Freq: Once | INTRAVENOUS | Status: AC
  Administered 2019-07-27: 150 mg via INTRAVENOUS
  Filled 2019-07-27: qty 150

## 2019-07-27 MED ORDER — SODIUM CHLORIDE 0.9 % IV SOLN
40.0000 mg/m2 | Freq: Once | INTRAVENOUS | Status: AC
  Administered 2019-07-27: 88 mg via INTRAVENOUS
  Filled 2019-07-27: qty 88

## 2019-07-27 MED ORDER — SODIUM CHLORIDE 0.9% FLUSH
10.0000 mL | Freq: Once | INTRAVENOUS | Status: AC
  Administered 2019-07-27: 10 mL via INTRAVENOUS
  Filled 2019-07-27: qty 10

## 2019-07-27 MED ORDER — HEPARIN SOD (PORK) LOCK FLUSH 100 UNIT/ML IV SOLN
500.0000 [IU] | Freq: Once | INTRAVENOUS | Status: AC | PRN
  Administered 2019-07-27: 500 [IU]
  Filled 2019-07-27: qty 5

## 2019-07-27 NOTE — Progress Notes (Signed)
HR 104, and Magnesium 1.4, per Dr. Janese Banks okay to proceed with treatment at this time.

## 2019-07-27 NOTE — Progress Notes (Signed)
HR 104 ok to proceed per md °

## 2019-07-27 NOTE — Progress Notes (Signed)
Patient stated that he had been having poor appetite and feeling fatigued.

## 2019-07-28 ENCOUNTER — Other Ambulatory Visit: Payer: Self-pay | Admitting: Oncology

## 2019-07-28 ENCOUNTER — Ambulatory Visit: Payer: Medicare HMO

## 2019-07-28 ENCOUNTER — Ambulatory Visit
Admission: RE | Admit: 2019-07-28 | Discharge: 2019-07-28 | Disposition: A | Discharge status: Home / Self Care | Payer: Medicare HMO | Source: Ambulatory Visit | Attending: Radiation Oncology | Admitting: Radiation Oncology

## 2019-07-28 DIAGNOSIS — C119 Malignant neoplasm of nasopharynx, unspecified: Secondary | ICD-10-CM

## 2019-07-28 DIAGNOSIS — Z510 Encounter for antineoplastic radiation therapy: Secondary | ICD-10-CM | POA: Diagnosis not present

## 2019-07-29 ENCOUNTER — Ambulatory Visit: Payer: Medicare HMO

## 2019-07-29 ENCOUNTER — Ambulatory Visit
Admission: RE | Admit: 2019-07-29 | Discharge: 2019-07-29 | Disposition: A | Discharge status: Home / Self Care | Payer: Medicare HMO | Source: Ambulatory Visit | Attending: Radiation Oncology | Admitting: Radiation Oncology

## 2019-07-29 DIAGNOSIS — Z510 Encounter for antineoplastic radiation therapy: Secondary | ICD-10-CM | POA: Diagnosis not present

## 2019-07-29 DIAGNOSIS — C119 Malignant neoplasm of nasopharynx, unspecified: Secondary | ICD-10-CM | POA: Diagnosis not present

## 2019-07-29 NOTE — Progress Notes (Signed)
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 Pryor Ochoa for symptoms of ear pain and discharge. Patient also noticed associated hearing loss and tinnitus. Patient was noted to have erythema and edema in the left sphenopalatine fossa as well as an exophytic mass on NPL exam. CT soft tissue of the neck showed asymmetric soft tissue within the left nasopharynx measuring 2.4 x 2 x 3.2 cm. There is lateral bulging with encroachment upon the left parapharyngeal space without frank infiltration. No extension into oropharynx or nasal cavity. No skull base infiltration. No appreciable mass within the larynx. No pathologically enlarged cervical lymph nodes. 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  He is currently on OxyContin and as needed oxycodone.  Reports that he is moving his bowels well.  Does endorse fatigue and poor appetite.  ECOG PS- 1 Pain scale- 3 Opioid associated constipation- no  Review of systems- Review of Systems  Constitutional: Positive for malaise/fatigue.  Negative for chills, fever and weight loss.  HENT: Negative for congestion, ear discharge and nosebleeds.   Eyes: Negative for blurred vision.  Respiratory: Negative for cough, hemoptysis, sputum production, shortness of breath and wheezing.   Cardiovascular: Negative for chest pain, palpitations, orthopnea and claudication.  Gastrointestinal: Negative for abdominal pain, blood in stool, constipation, diarrhea, heartburn, melena, nausea and vomiting.       Difficulty swallowing  Genitourinary: Negative for dysuria, flank pain, frequency, hematuria and urgency.  Musculoskeletal: Negative for back pain, joint pain and myalgias.  Skin: Negative for rash.  Neurological: Negative for dizziness, tingling, focal weakness, seizures, weakness and headaches.  Endo/Heme/Allergies: Does not bruise/bleed easily.  Psychiatric/Behavioral: Negative for depression and suicidal ideas. The patient does not have insomnia.       Allergies  Allergen Reactions  . Opana [Oxymorphone Hcl]     Made him BLACKOUT  . Amoxicillin Other (See Comments)  . Oxymorphone Other (See Comments)  . Sulfa Antibiotics Other (See Comments)  . Sulfur     Childhood reaction      Past Medical History:  Diagnosis Date  . Alcohol abuse   . Benzodiazepine dependence ([place])   . Benzodiazepine withdrawal ([place])   . Chronic pain in right foot   . COPD (chronic obstructive pulmonary disease) ([place])   . Depression 08/19/2017  . Hepatitis C   . Hypertension   . 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[place])   . Seizures ([place]) 2010  . Sleep apnea   . Syncope    questionable vasovagal     Past Surgical History:  Procedure Laterality Date  . COLONOSCOPY WITH PROPOFOL N/A 12/16/2016   Procedure: COLONOSCOPY WITH PROPOFOL;  Surgeon: Lollie Sails, MD;  Location: [facility]  ENDOSCOPY;  Service: Endoscopy;  Laterality: N/A;  . COLONOSCOPY WITH PROPOFOL N/A 03/14/2017   Procedure: COLONOSCOPY WITH PROPOFOL;  Surgeon: Lollie Sails, MD;  Location:  [facility] ENDOSCOPY;  Service: Endoscopy;  Laterality: N/A;  . FOOT SURGERY Right 03/12/2002  . HEMORRHOID SURGERY    . LITHOTRIPSY    . MYRINGOTOMY WITH TUBE PLACEMENT Left 04/28/2019   Procedure: MYRINGOTOMY WITH TUBE PLACEMENT;  Surgeon: Carloyn Manner, MD;  Location: [place];  Service: ENT;  Laterality: Left;  . NASOPHARYNGOSCOPY N/A 04/28/2019   Procedure: NASOPHARYNGOSCOPY WITH BIOPSY;  Surgeon: Carloyn Manner, MD;  Location: [place];  Service: ENT;  Laterality: N/A;  . PORTA CATH INSERTION N/A 05/20/2019   Procedure: PORTA CATH INSERTION;  Surgeon: Algernon Huxley, MD;  Location: [place] CV LAB;  Service: Cardiovascular;  Laterality: N/A;  . TONSILLECTOMY      Social History   Socioeconomic History  . Marital status: Divorced    Spouse name: Not on file  . Number of children: Not on file  . Years of education: Not on file  . Highest education level: Not on file  Occupational History  . Not on file  Tobacco Use  . Smoking status: Former Smoker    Packs/day: 0.50    Years: 50.00    Pack years: 25.00    Types: Cigarettes    Quit date: 02/05/2019    Years since quitting: 0.4  . Smokeless tobacco: Never Used  Substance and Sexual Activity  . Alcohol use: Not Currently    Alcohol/week: 7.0 standard drinks    Types: 7 Standard drinks or equivalent per week  . Drug use: Not Currently  . Sexual activity: Not Currently  Other Topics Concern  . Not on file  Social History Narrative  . Not on file   Social Determinants of Health   Financial Resource Strain:   . Difficulty of Paying Living Expenses:   Food Insecurity: No Food Insecurity  . Worried About Charity fundraiser in the Last Year: Never true  . Ran Out of Food in the Last Year: Never true  Transportation Needs: No Transportation Needs  . Lack of Transportation (Medical): No  . Lack of Transportation (Non-Medical): No  Physical Activity: Inactive  . Days of Exercise per Week: 0 days   . Minutes of Exercise per Session: 0 min  Stress:   . Feeling of Stress :   Social Connections:   . Frequency of Communication with Friends and Family:   . Frequency of Social Gatherings with Friends and Family:   . Attends Religious Services:   . Active Member of Clubs or Organizations:   . Attends Archivist Meetings:   Marland Kitchen Marital Status:   Intimate Partner Violence: Not At Risk  . Fear of Current or Ex-Partner: No  . Emotionally Abused: No  . Physically Abused: No  . Sexually Abused: No    Family History  Problem Relation Age of Onset  . Arthritis Mother   . Hypertension Mother   . Cancer Father   . Kidney disease Father   . Alcohol abuse Paternal Aunt   . Depression Maternal Grandfather      Current Outpatient Medications:  .  albuterol (VENTOLIN HFA) 108 (90 Base) MCG/ACT inhaler, Inhale 2 puffs into the lungs every 6 (six) hours as needed for wheezing or shortness of breath., Disp: 18 g, Rfl: 3 .  ALPRAZolam (XANAX) 0.5 MG tablet, Take 1 tablet (0.5 mg total) by mouth every  6 (six) hours as needed for anxiety., Disp: 60 tablet, Rfl: 0 .  amLODipine (NORVASC) 5 MG tablet, Take 1 tablet (5 mg total) by mouth daily., Disp: 90 tablet, Rfl: 3 .  antiseptic oral rinse (BIOTENE) LIQD, 15 mLs by Mouth Rinse route as needed for dry mouth., Disp: , Rfl:  .  aspirin EC 81 MG tablet, Take 81 mg by mouth daily., Disp: , Rfl:  .  cyclobenzaprine (FLEXERIL) 5 MG tablet, Take 2 tablets (10 mg total) by mouth 2 (two) times daily as needed for muscle spasms., Disp: 45 tablet, Rfl: 1 .  dexamethasone (DECADRON) 2 MG tablet, Take 1 tablet (2 mg total) by mouth 2 (two) times daily., Disp: 60 tablet, Rfl: 1 .  dexamethasone (DECADRON) 4 MG tablet, Take 2 tablets by mouth once a day on the day after chemotherapy and then take 2 tablets two times a day for 2 days. Take with food., Disp: 30 tablet, Rfl: 1 .  fentaNYL (DURAGESIC) 12 MCG/HR, Place 1 patch onto the skin every 3 (three)  days., Disp: 10 patch, Rfl: 0 .  fluconazole (DIFLUCAN) 100 MG tablet, Take 1 tablet (100 mg total) by mouth daily., Disp: 7 tablet, Rfl: 0 .  gabapentin (NEURONTIN) 100 MG capsule, Take 2 capsules (200 mg total) by mouth 3 (three) times daily., Disp: 180 capsule, Rfl: 3 .  hydrOXYzine (ATARAX/VISTARIL) 25 MG tablet, Take 1 to 2 tablets po QHS prn insomnia/anxiety, Disp: 60 tablet, Rfl: 2 .  lidocaine (XYLOCAINE) 2 % solution, Use as directed 15 mLs in the mouth or throat as needed for mouth pain., Disp: 100 mL, Rfl: 0 .  lidocaine-prilocaine (EMLA) cream, Apply to affected area once, Disp: 30 g, Rfl: 3 .  magic mouthwash w/lidocaine SOLN, Take 5 mLs by mouth 4 (four) times daily as needed for mouth pain., Disp: 240 mL, Rfl: 3 .  MAGNESIUM PO, Take by mouth., Disp: , Rfl:  .  Multiple Vitamin (MULTIVITAMIN WITH MINERALS) TABS tablet, Take 1 tablet by mouth daily., Disp: , Rfl:  .  ondansetron (ZOFRAN) 4 MG tablet, Take 1 tablet (4 mg total) by mouth every 8 (eight) hours as needed for up to 10 doses for nausea or vomiting., Disp: 20 tablet, Rfl: 0 .  ondansetron (ZOFRAN) 8 MG tablet, TAKE 1 TABLET BY MOUTH TWICE DAILY AS NEEDED. START ON THE THRID DAY AFTER CHEMOTHERAPY., Disp: 30 tablet, Rfl: 1 .  oxyCODONE (OXYCONTIN) 20 mg 12 hr tablet, Take 1 tablet (20 mg total) by mouth every 12 (twelve) hours., Disp: 60 tablet, Rfl: 0 .  Oxycodone HCl 10 MG TABS, Take 1 tablet (10 mg total) by mouth every 4 (four) hours as needed., Disp: 180 tablet, Rfl: 0 .  pantoprazole (PROTONIX) 20 MG tablet, Take 1 tablet (20 mg total) by mouth daily., Disp: 30 tablet, Rfl: 1 .  prochlorperazine (COMPAZINE) 10 MG tablet, TAKE 1 TABLET BY MOUTH EVERY 6 HOURS AS NEEDED FOR NAUSEA OR VOMITING, Disp: 30 tablet, Rfl: 1 .  sucralfate (CARAFATE) 1 g tablet, Take 1 tablet (1 g total) by mouth 3 (three) times daily before meals. Dissolve in4to 5 tbs warm water, swish &swallow, Disp: 90 tablet, Rfl: 6  Physical exam:  Vitals:    07/27/19 0852  BP: 138/75  Pulse: (!) 104  Temp: 97.8 F (36.6 C)  TempSrc: Tympanic  SpO2: 98%  Weight: 208 lb (94.3 kg)  Height: 5\' 9"  (1.753 m)   Physical Exam HENT:     Head: Normocephalic and atraumatic.  Mouth/Throat:     Mouth: Mucous membranes are moist.     Pharynx: Oropharynx is clear.  Cardiovascular:     Rate and Rhythm: Normal rate and regular rhythm.     Heart sounds: Normal heart sounds.  Pulmonary:     Effort: Pulmonary effort is normal.     Breath sounds: Normal breath sounds.  Abdominal:     General: Bowel sounds are normal.     Palpations: Abdomen is soft.  Skin:    General: Skin is warm and dry.  Neurological:     Mental Status: He is alert and oriented to person, place, and time.      CMP Latest Ref Rng & Units 07/27/2019  Glucose 70 - 99 mg/dL 128(H)  BUN 8 - 23 mg/dL 8  Creatinine 0.61 - 1.24 mg/dL 1.19  Sodium 135 - 145 mmol/L 136  Potassium 3.5 - 5.1 mmol/L 3.4(L)  Chloride 98 - 111 mmol/L 98  CO2 22 - 32 mmol/L 26  Calcium 8.9 - 10.3 mg/dL 8.4(L)  Total Protein 6.5 - 8.1 g/dL -  Total Bilirubin 0.3 - 1.2 mg/dL -  Alkaline Phos 38 - 126 U/L -  AST 15 - 41 U/L -  ALT 0 - 44 U/L -   CBC Latest Ref Rng & Units 07/27/2019  WBC 4.0 - 10.5 K/uL 5.8  Hemoglobin 13.0 - 17.0 g/dL 10.1(L)  Hematocrit 39.0 - 52.0 % 30.2(L)  Platelets 150 - 400 K/uL 567(H)    No images are attached to the encounter.  DG Chest 2 View  Result Date: 07/14/2019 CLINICAL DATA:  Pt woke up with fever, chills and congestion this morning. History of throat cancer, porta cath 05-20-2019, COPD, HTN, former smoker. shieldedfever EXAM: CHEST - 2 VIEW COMPARISON:  None. FINDINGS: Normal mediastinum and cardiac silhouette. Normal pulmonary vasculature. No evidence of effusion, infiltrate, or pneumothorax. No acute bony abnormality. Port in the anterior chest wall with tip in distal SVC. IMPRESSION: No acute cardiopulmonary process. Electronically Signed   By: Suzy Bouchard  M.D.   On: 07/14/2019 14:41     Assessment and plan- Patient is a 69 y.o. male with history of nasopharyngeal carcinoma stage II T2 N0 M0. He is here for on treatment assessment prior to cycle 6 of weekly cisplatin chemotherapy  Counts okay to proceed with cycle 6 of weekly cisplatin chemotherapy today.  His white cell count is improved to 5.8 with an ANC of 2.8.  Platelets are elevated at 567.  Hemoglobin is remaining stable around 10.  I will see him back in 1 week's time for cycle 7 of weekly cisplatin chemotherapy if his counts permit.  Neoplasm related pain: Continue OxyContin and as needed oxycodone.  Chemo induced nausea- zofran and compazine renewed    Visit Diagnosis 1. Encounter for antineoplastic chemotherapy   2. Chemotherapy induced neutropenia (HCC)   3. Nasopharyngeal carcinoma ([place])      Dr. Randa Evens, MD, MPH [facility] at [facility] [id number] 07/29/2019 7:52 PM

## 2019-07-30 ENCOUNTER — Ambulatory Visit
Admission: RE | Admit: 2019-07-30 | Discharge: 2019-07-30 | Disposition: A | Discharge status: Home / Self Care | Payer: Medicare HMO | Source: Ambulatory Visit | Attending: Radiation Oncology | Admitting: Radiation Oncology

## 2019-07-30 DIAGNOSIS — C119 Malignant neoplasm of nasopharynx, unspecified: Secondary | ICD-10-CM | POA: Diagnosis not present

## 2019-07-30 DIAGNOSIS — Z510 Encounter for antineoplastic radiation therapy: Secondary | ICD-10-CM | POA: Diagnosis not present

## 2019-08-02 ENCOUNTER — Ambulatory Visit: Payer: Medicare HMO

## 2019-08-02 ENCOUNTER — Telehealth: Payer: Self-pay

## 2019-08-02 ENCOUNTER — Encounter: Payer: Self-pay | Admitting: Oncology

## 2019-08-02 ENCOUNTER — Other Ambulatory Visit: Payer: Self-pay

## 2019-08-02 NOTE — Progress Notes (Signed)
Patient stated that he had not been feeling well for the past week. Patient stated that he has had nausea, vomiting, dry nose and bleeding, mouth sores, dizziness and poor appetite. Patient stated that he had tried drinking the Ensures, however, since they are too sweet, he stated that he can't drink them because they make him vomit.

## 2019-08-02 NOTE — Telephone Encounter (Signed)
Nutrition  Called patient for nutrition follow-up.    Patient reports that he has been sick since last night with vomiting and not eating much.  Reports that he is trying to get some rest right now and prefers if RD calls back at another time.   [name] B. Zenia Resides, [place], [place] Registered Dietitian [phone] (pager)

## 2019-08-03 ENCOUNTER — Inpatient Hospital Stay: Payer: Medicare HMO

## 2019-08-03 ENCOUNTER — Inpatient Hospital Stay (HOSPITAL_BASED_OUTPATIENT_CLINIC_OR_DEPARTMENT_OTHER): Payer: Medicare HMO | Admitting: Oncology

## 2019-08-03 ENCOUNTER — Ambulatory Visit
Admission: RE | Admit: 2019-08-03 | Discharge: 2019-08-03 | Disposition: A | Discharge status: Home / Self Care | Payer: Medicare HMO | Source: Ambulatory Visit | Attending: Radiation Oncology | Admitting: Radiation Oncology

## 2019-08-03 VITALS — BP 143/77 | HR 102 | Temp 96.1°F | Wt 198.7 lb

## 2019-08-03 DIAGNOSIS — Z5111 Encounter for antineoplastic chemotherapy: Secondary | ICD-10-CM | POA: Diagnosis not present

## 2019-08-03 DIAGNOSIS — Z510 Encounter for antineoplastic radiation therapy: Secondary | ICD-10-CM | POA: Diagnosis not present

## 2019-08-03 DIAGNOSIS — T451X5A Adverse effect of antineoplastic and immunosuppressive drugs, initial encounter: Secondary | ICD-10-CM

## 2019-08-03 DIAGNOSIS — D701 Agranulocytosis secondary to cancer chemotherapy: Secondary | ICD-10-CM

## 2019-08-03 DIAGNOSIS — Z5189 Encounter for other specified aftercare: Secondary | ICD-10-CM | POA: Diagnosis not present

## 2019-08-03 DIAGNOSIS — R42 Dizziness and giddiness: Secondary | ICD-10-CM | POA: Diagnosis not present

## 2019-08-03 DIAGNOSIS — C119 Malignant neoplasm of nasopharynx, unspecified: Secondary | ICD-10-CM

## 2019-08-03 DIAGNOSIS — Z95828 Presence of other vascular implants and grafts: Secondary | ICD-10-CM

## 2019-08-03 DIAGNOSIS — N179 Acute kidney failure, unspecified: Secondary | ICD-10-CM | POA: Diagnosis not present

## 2019-08-03 DIAGNOSIS — D6959 Other secondary thrombocytopenia: Secondary | ICD-10-CM | POA: Diagnosis not present

## 2019-08-03 DIAGNOSIS — R112 Nausea with vomiting, unspecified: Secondary | ICD-10-CM | POA: Diagnosis not present

## 2019-08-03 DIAGNOSIS — R7989 Other specified abnormal findings of blood chemistry: Secondary | ICD-10-CM | POA: Diagnosis not present

## 2019-08-03 LAB — COMPREHENSIVE METABOLIC PANEL
ALT: 17 U/L (ref 0–44)
AST: 28 U/L (ref 15–41)
Albumin: 3.9 g/dL (ref 3.5–5.0)
Alkaline Phosphatase: 57 U/L (ref 38–126)
Anion gap: 15 (ref 5–15)
BUN: 23 mg/dL (ref 8–23)
CO2: 24 mmol/L (ref 22–32)
Calcium: 8.7 mg/dL — ABNORMAL LOW (ref 8.9–10.3)
Chloride: 95 mmol/L — ABNORMAL LOW (ref 98–111)
Creatinine, Ser: 1.87 mg/dL — ABNORMAL HIGH (ref 0.61–1.24)
GFR calc Af Amer: 42 mL/min — ABNORMAL LOW (ref >60)
GFR calc non Af Amer: 37 mL/min — ABNORMAL LOW (ref >60)
Glucose, Bld: 119 mg/dL — ABNORMAL HIGH (ref 70–99)
Potassium: 3.6 mmol/L (ref 3.5–5.1)
Sodium: 134 mmol/L — ABNORMAL LOW (ref 135–145)
Total Bilirubin: 0.8 mg/dL (ref 0.3–1.2)
Total Protein: 7.2 g/dL (ref 6.5–8.1)

## 2019-08-03 LAB — CBC WITH DIFFERENTIAL/PLATELET
Abs Immature Granulocytes: 0.23 10*3/uL — ABNORMAL HIGH (ref 0.00–0.07)
Basophils Absolute: 0.1 10*3/uL (ref 0.0–0.1)
Basophils Relative: 1 %
Eosinophils Absolute: 0 10*3/uL (ref 0.0–0.5)
Eosinophils Relative: 0 %
HCT: 31.2 % — ABNORMAL LOW (ref 39.0–52.0)
Hemoglobin: 10.6 g/dL — ABNORMAL LOW (ref 13.0–17.0)
Immature Granulocytes: 2 %
Lymphocytes Relative: 11 %
Lymphs Abs: 1.1 10*3/uL (ref 0.7–4.0)
MCH: 29.9 pg (ref 26.0–34.0)
MCHC: 34 g/dL (ref 30.0–36.0)
MCV: 87.9 fL (ref 80.0–100.0)
Monocytes Absolute: 1.3 10*3/uL — ABNORMAL HIGH (ref 0.1–1.0)
Monocytes Relative: 13 %
Neutro Abs: 7.2 10*3/uL (ref 1.7–7.7)
Neutrophils Relative %: 73 %
Platelets: 440 10*3/uL — ABNORMAL HIGH (ref 150–400)
RBC: 3.55 MIL/uL — ABNORMAL LOW (ref 4.22–5.81)
RDW: 15.5 % (ref 11.5–15.5)
WBC: 9.8 10*3/uL (ref 4.0–10.5)
nRBC: 0 % (ref 0.0–0.2)

## 2019-08-03 LAB — MAGNESIUM: Magnesium: 1.5 mg/dL — ABNORMAL LOW (ref 1.7–2.4)

## 2019-08-03 MED ORDER — SODIUM CHLORIDE 0.9% FLUSH
10.0000 mL | Freq: Once | INTRAVENOUS | Status: AC
  Administered 2019-08-03: 10 mL via INTRAVENOUS
  Filled 2019-08-03: qty 10

## 2019-08-03 MED ORDER — HEPARIN SOD (PORK) LOCK FLUSH 100 UNIT/ML IV SOLN
INTRAVENOUS | Status: AC
  Filled 2019-08-03: qty 5

## 2019-08-03 MED ORDER — SODIUM CHLORIDE 0.9 % IV SOLN
Freq: Once | INTRAVENOUS | Status: AC
  Filled 2019-08-03: qty 250

## 2019-08-03 MED ORDER — HEPARIN SOD (PORK) LOCK FLUSH 100 UNIT/ML IV SOLN
500.0000 [IU] | Freq: Once | INTRAVENOUS | Status: AC
  Administered 2019-08-03: 500 [IU] via INTRAVENOUS
  Filled 2019-08-03: qty 5

## 2019-08-04 ENCOUNTER — Ambulatory Visit
Admission: RE | Admit: 2019-08-04 | Discharge: 2019-08-04 | Disposition: A | Discharge status: Home / Self Care | Payer: Medicare HMO | Source: Ambulatory Visit | Attending: Radiation Oncology | Admitting: Radiation Oncology

## 2019-08-04 ENCOUNTER — Ambulatory Visit: Payer: Medicare HMO

## 2019-08-04 DIAGNOSIS — C119 Malignant neoplasm of nasopharynx, unspecified: Secondary | ICD-10-CM | POA: Diagnosis not present

## 2019-08-04 DIAGNOSIS — Z510 Encounter for antineoplastic radiation therapy: Secondary | ICD-10-CM | POA: Diagnosis not present

## 2019-08-05 ENCOUNTER — Other Ambulatory Visit: Payer: Self-pay | Admitting: Oncology

## 2019-08-05 ENCOUNTER — Ambulatory Visit
Admission: RE | Admit: 2019-08-05 | Discharge: 2019-08-05 | Disposition: A | Discharge status: Home / Self Care | Payer: Medicare HMO | Source: Ambulatory Visit | Attending: Radiation Oncology | Admitting: Radiation Oncology

## 2019-08-05 ENCOUNTER — Other Ambulatory Visit: Payer: Self-pay

## 2019-08-05 ENCOUNTER — Inpatient Hospital Stay: Payer: Medicare HMO

## 2019-08-05 VITALS — BP 144/59 | HR 80 | Temp 98.3°F | Resp 18

## 2019-08-05 DIAGNOSIS — R42 Dizziness and giddiness: Secondary | ICD-10-CM | POA: Diagnosis not present

## 2019-08-05 DIAGNOSIS — J449 Chronic obstructive pulmonary disease, unspecified: Secondary | ICD-10-CM

## 2019-08-05 DIAGNOSIS — C119 Malignant neoplasm of nasopharynx, unspecified: Secondary | ICD-10-CM | POA: Diagnosis not present

## 2019-08-05 DIAGNOSIS — R112 Nausea with vomiting, unspecified: Secondary | ICD-10-CM | POA: Diagnosis not present

## 2019-08-05 DIAGNOSIS — Z5189 Encounter for other specified aftercare: Secondary | ICD-10-CM | POA: Diagnosis not present

## 2019-08-05 DIAGNOSIS — R7989 Other specified abnormal findings of blood chemistry: Secondary | ICD-10-CM | POA: Diagnosis not present

## 2019-08-05 DIAGNOSIS — D6959 Other secondary thrombocytopenia: Secondary | ICD-10-CM | POA: Diagnosis not present

## 2019-08-05 DIAGNOSIS — Z5111 Encounter for antineoplastic chemotherapy: Secondary | ICD-10-CM | POA: Diagnosis not present

## 2019-08-05 DIAGNOSIS — Z95828 Presence of other vascular implants and grafts: Secondary | ICD-10-CM

## 2019-08-05 DIAGNOSIS — D701 Agranulocytosis secondary to cancer chemotherapy: Secondary | ICD-10-CM | POA: Diagnosis not present

## 2019-08-05 DIAGNOSIS — N179 Acute kidney failure, unspecified: Secondary | ICD-10-CM | POA: Diagnosis not present

## 2019-08-05 DIAGNOSIS — Z510 Encounter for antineoplastic radiation therapy: Secondary | ICD-10-CM | POA: Diagnosis not present

## 2019-08-05 MED ORDER — HEPARIN SOD (PORK) LOCK FLUSH 100 UNIT/ML IV SOLN
500.0000 [IU] | Freq: Once | INTRAVENOUS | Status: AC
  Administered 2019-08-05: 500 [IU] via INTRAVENOUS
  Filled 2019-08-05: qty 5

## 2019-08-05 MED ORDER — ALBUTEROL SULFATE HFA 108 (90 BASE) MCG/ACT IN AERS: 2.0000 | INHALATION_SPRAY | Freq: Four times a day (QID) | RESPIRATORY_TRACT | 3 refills | Status: DC | PRN

## 2019-08-05 MED ORDER — SODIUM CHLORIDE 0.9 % IV SOLN
Freq: Once | INTRAVENOUS | Status: AC
  Filled 2019-08-05: qty 250

## 2019-08-05 MED ORDER — OXYCODONE HCL ER 20 MG PO T12A: 20.0000 mg | EXTENDED_RELEASE_TABLET | Freq: Two times a day (BID) | ORAL | 0 refills | Status: DC

## 2019-08-05 MED ORDER — SODIUM CHLORIDE 0.9% FLUSH
10.0000 mL | INTRAVENOUS | Status: DC | PRN
  Filled 2019-08-05: qty 10

## 2019-08-05 NOTE — Progress Notes (Signed)
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 Pryor Ochoa for symptoms of ear pain and discharge. Patient also noticed associated hearing loss and tinnitus. Patient was noted to have erythema and edema in the left sphenopalatine fossa as well as an exophytic mass on NPL exam. CT soft tissue of the neck showed asymmetric soft tissue within the left nasopharynx measuring 2.4 x 2 x 3.2 cm. There is lateral bulging with encroachment upon the left parapharyngeal space without frank infiltration. No extension into oropharynx or nasal cavity. No skull base infiltration. No appreciable mass within the larynx. No pathologically enlarged cervical lymph nodes. 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  Has ongoing pain during swallowing.  Feels a little lightheaded today.  Appetite is poor.  Reports intermittent nausea and vomiting  ECOG PS- 1 Pain scale- 4 Opioid associated constipation- no  Review of systems- Review of Systems  Constitutional: Positive for malaise/fatigue. Negative for chills, fever and weight loss.  HENT:  Negative for congestion, ear discharge and nosebleeds.   Eyes: Negative for blurred vision.  Respiratory: Negative for cough, hemoptysis, sputum production, shortness of breath and wheezing.   Cardiovascular: Negative for chest pain, palpitations, orthopnea and claudication.  Gastrointestinal: Positive for nausea. Negative for abdominal pain, blood in stool, constipation, diarrhea, heartburn, melena and vomiting.       Difficulty swallowing  Genitourinary: Negative for dysuria, flank pain, frequency, hematuria and urgency.  Musculoskeletal: Negative for back pain, joint pain and myalgias.  Skin: Negative for rash.  Neurological: Positive for dizziness. Negative for tingling, focal weakness, seizures, weakness and headaches.  Endo/Heme/Allergies: Does not bruise/bleed easily.  Psychiatric/Behavioral: Negative for depression and suicidal ideas. The patient does not have insomnia.        Allergies  Allergen Reactions  . Opana [Oxymorphone Hcl]     Made him BLACKOUT  . Amoxicillin Other (See Comments)  . Oxymorphone Other (See Comments)  . Sulfa Antibiotics Other (See Comments)  . Sulfur     Childhood reaction      Past Medical History:  Diagnosis Date  . Alcohol abuse   . Benzodiazepine dependence ([place])   . Benzodiazepine withdrawal ([place])   . Chronic pain in right foot   . COPD (chronic obstructive pulmonary disease) ([place])   . Depression 08/19/2017  . Hepatitis C   . Hypertension   . 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[place])   . Seizures ([place]) 2010  . Sleep apnea   . Syncope    questionable vasovagal     Past Surgical History:  Procedure Laterality Date  . COLONOSCOPY WITH PROPOFOL N/A 12/16/2016   Procedure: COLONOSCOPY WITH PROPOFOL;  Surgeon: Lollie Sails, MD;  Location[facility] ENDOSCOPY;  Service: Endoscopy;  Laterality: N/A;  .  COLONOSCOPY WITH PROPOFOL N/A 03/14/2017   Procedure: COLONOSCOPY WITH PROPOFOL;  Surgeon: Lollie Sails, MD;  Location: [facility] ENDOSCOPY;  Service:  Endoscopy;  Laterality: N/A;  . FOOT SURGERY Right 03/12/2002  . HEMORRHOID SURGERY    . LITHOTRIPSY    . MYRINGOTOMY WITH TUBE PLACEMENT Left 04/28/2019   Procedure: MYRINGOTOMY WITH TUBE PLACEMENT;  Surgeon: Carloyn Manner, MD;  Location: [place];  Service: ENT;  Laterality: Left;  . NASOPHARYNGOSCOPY N/A 04/28/2019   Procedure: NASOPHARYNGOSCOPY WITH BIOPSY;  Surgeon: Carloyn Manner, MD;  Location: [place];  Service: ENT;  Laterality: N/A;  . PORTA CATH INSERTION N/A 05/20/2019   Procedure: PORTA CATH INSERTION;  Surgeon: Algernon Huxley, MD;  Location: [place] CV LAB;  Service: Cardiovascular;  Laterality: N/A;  . TONSILLECTOMY      Social History   Socioeconomic History  . Marital status: Divorced    Spouse name: Not on file  . Number of children: Not on file  . Years of education: Not on file  . Highest education level: Not on file  Occupational History  . Not on file  Tobacco Use  . Smoking status: Former Smoker    Packs/day: 0.50    Years: 50.00    Pack years: 25.00    Types: Cigarettes    Quit date: 02/05/2019    Years since quitting: 0.4  . Smokeless tobacco: Never Used  Substance and Sexual Activity  . Alcohol use: Not Currently    Alcohol/week: 7.0 standard drinks    Types: 7 Standard drinks or equivalent per week  . Drug use: Not Currently  . Sexual activity: Not Currently  Other Topics Concern  . Not on file  Social History Narrative  . Not on file   Social Determinants of Health   Financial Resource Strain:   . Difficulty of Paying Living Expenses:   Food Insecurity: No Food Insecurity  . Worried About Charity fundraiser in the Last Year: Never true  . Ran Out of Food in the Last Year: Never true  Transportation Needs: No Transportation Needs  . Lack of Transportation (Medical): No  . Lack of Transportation (Non-Medical): No  Physical Activity: Inactive  . Days of Exercise per Week: 0 days  . Minutes of Exercise per  Session: 0 min  Stress:   . Feeling of Stress :   Social Connections:   . Frequency of Communication with Friends and Family:   . Frequency of Social Gatherings with Friends and Family:   . Attends Religious Services:   . Active Member of Clubs or Organizations:   . Attends Archivist Meetings:   Marland Kitchen Marital Status:   Intimate Partner Violence: Not At Risk  . Fear of Current or Ex-Partner: No  . Emotionally Abused: No  . Physically Abused: No  . Sexually Abused: No    Family History  Problem Relation Age of Onset  . Arthritis Mother   . Hypertension Mother   . Cancer Father   . Kidney disease Father   . Alcohol abuse Paternal Aunt   . Depression Maternal Grandfather      Current Outpatient Medications:  .  albuterol (VENTOLIN HFA) 108 (90 Base) MCG/ACT inhaler, Inhale 2 puffs into the lungs every 6 (six) hours as needed for wheezing or shortness of breath., Disp: 18 g, Rfl: 3 .  ALPRAZolam (XANAX) 0.5 MG tablet, Take 1 tablet (0.5 mg total) by mouth every 6 (six) hours as needed for anxiety., Disp: 60  tablet, Rfl: 0 .  amLODipine (NORVASC) 5 MG tablet, Take 1 tablet (5 mg total) by mouth daily., Disp: 90 tablet, Rfl: 3 .  aspirin EC 81 MG tablet, Take 81 mg by mouth daily., Disp: , Rfl:  .  dexamethasone (DECADRON) 4 MG tablet, Take 2 tablets by mouth once a day on the day after chemotherapy and then take 2 tablets two times a day for 2 days. Take with food., Disp: 30 tablet, Rfl: 1 .  fentaNYL (DURAGESIC) 12 MCG/HR, Place 1 patch onto the skin every 3 (three) days., Disp: 10 patch, Rfl: 0 .  lidocaine (XYLOCAINE) 2 % solution, Use as directed 15 mLs in the mouth or throat as needed for mouth pain., Disp: 100 mL, Rfl: 0 .  lidocaine-prilocaine (EMLA) cream, Apply to affected area once, Disp: 30 g, Rfl: 3 .  magic mouthwash w/lidocaine SOLN, Take 5 mLs by mouth 4 (four) times daily as needed for mouth pain., Disp: 240 mL, Rfl: 3 .  Multiple Vitamin (MULTIVITAMIN WITH  MINERALS) TABS tablet, Take 1 tablet by mouth daily., Disp: , Rfl:  .  ondansetron (ZOFRAN) 8 MG tablet, TAKE 1 TABLET BY MOUTH TWICE DAILY AS NEEDED. START ON THE THRID DAY AFTER CHEMOTHERAPY., Disp: 30 tablet, Rfl: 1 .  oxyCODONE (OXYCONTIN) 20 mg 12 hr tablet, Take 1 tablet (20 mg total) by mouth every 12 (twelve) hours., Disp: 60 tablet, Rfl: 0 .  prochlorperazine (COMPAZINE) 10 MG tablet, TAKE 1 TABLET BY MOUTH EVERY 6 HOURS AS NEEDED FOR NAUSEA OR VOMITING, Disp: 30 tablet, Rfl: 1 .  sucralfate (CARAFATE) 1 g tablet, Take 1 tablet (1 g total) by mouth 3 (three) times daily before meals. Dissolve in4to 5 tbs warm water, swish &swallow, Disp: 90 tablet, Rfl: 6 .  antiseptic oral rinse (BIOTENE) LIQD, 15 mLs by Mouth Rinse route as needed for dry mouth., Disp: , Rfl:  .  cyclobenzaprine (FLEXERIL) 5 MG tablet, Take 2 tablets (10 mg total) by mouth 2 (two) times daily as needed for muscle spasms. (Patient not taking: Reported on 08/02/2019), Disp: 45 tablet, Rfl: 1 .  fluconazole (DIFLUCAN) 100 MG tablet, Take 1 tablet (100 mg total) by mouth daily. (Patient not taking: Reported on 08/02/2019), Disp: 7 tablet, Rfl: 0 .  gabapentin (NEURONTIN) 100 MG capsule, Take 2 capsules (200 mg total) by mouth 3 (three) times daily. (Patient not taking: Reported on 08/02/2019), Disp: 180 capsule, Rfl: 3 .  hydrOXYzine (ATARAX/VISTARIL) 25 MG tablet, Take 1 to 2 tablets po QHS prn insomnia/anxiety (Patient not taking: Reported on 08/02/2019), Disp: 60 tablet, Rfl: 2 .  MAGNESIUM PO, Take by mouth., Disp: , Rfl:  .  ondansetron (ZOFRAN) 4 MG tablet, Take 1 tablet (4 mg total) by mouth every 8 (eight) hours as needed for up to 10 doses for nausea or vomiting. (Patient not taking: Reported on 08/02/2019), Disp: 20 tablet, Rfl: 0 .  Oxycodone HCl 10 MG TABS, Take 1 tablet (10 mg total) by mouth every 4 (four) hours as needed. (Patient not taking: Reported on 08/02/2019), Disp: 180 tablet, Rfl: 0 .  pantoprazole (PROTONIX) 20  MG tablet, Take 1 tablet (20 mg total) by mouth daily. (Patient not taking: Reported on 08/02/2019), Disp: 30 tablet, Rfl: 1  Physical exam:  Vitals:   08/03/19 0843  BP: (!) 143/77  Pulse: (!) 102  Temp: (!) 96.1 F (35.6 C)  TempSrc: Tympanic  SpO2: 99%  Weight: 198 lb 11.2 oz (90.1 kg)   Physical Exam Constitutional:  Comments: Appears fatigued  HENT:     Head: Normocephalic and atraumatic.     Mouth/Throat:     Comments: Grade 1 mucositis Cardiovascular:     Rate and Rhythm: Normal rate and regular rhythm.     Heart sounds: Normal heart sounds.  Pulmonary:     Effort: Pulmonary effort is normal.     Breath sounds: Normal breath sounds.  Abdominal:     General: Bowel sounds are normal.     Palpations: Abdomen is soft.  Skin:    General: Skin is warm and dry.  Neurological:     Mental Status: He is alert and oriented to person, place, and time.      CMP Latest Ref Rng & Units 08/03/2019  Glucose 70 - 99 mg/dL 119(H)  BUN 8 - 23 mg/dL 23  Creatinine 0.61 - 1.24 mg/dL 1.87(H)  Sodium 135 - 145 mmol/L 134(L)  Potassium 3.5 - 5.1 mmol/L 3.6  Chloride 98 - 111 mmol/L 95(L)  CO2 22 - 32 mmol/L 24  Calcium 8.9 - 10.3 mg/dL 8.7(L)  Total Protein 6.5 - 8.1 g/dL 7.2  Total Bilirubin 0.3 - 1.2 mg/dL 0.8  Alkaline Phos 38 - 126 U/L 57  AST 15 - 41 U/L 28  ALT 0 - 44 U/L 17   CBC Latest Ref Rng & Units 08/03/2019  WBC 4.0 - 10.5 K/uL 9.8  Hemoglobin 13.0 - 17.0 g/dL 10.6(L)  Hematocrit 39.0 - 52.0 % 31.2(L)  Platelets 150 - 400 K/uL 440(H)    No images are attached to the encounter.  DG Chest 2 View  Result Date: 07/14/2019 CLINICAL DATA:  Pt woke up with fever, chills and congestion this morning. History of throat cancer, porta cath 05-20-2019, COPD, HTN, former smoker. shieldedfever EXAM: CHEST - 2 VIEW COMPARISON:  None. FINDINGS: Normal mediastinum and cardiac silhouette. Normal pulmonary vasculature. No evidence of effusion, infiltrate, or pneumothorax. No acute  bony abnormality. Port in the anterior chest wall with tip in distal SVC. IMPRESSION: No acute cardiopulmonary process. Electronically Signed   By: Suzy Bouchard M.D.   On: 07/14/2019 14:41     Assessment and plan- Patient is a 69 y.o. male with history of nasopharyngeal carcinoma stage II T2 N0 M0.   He is here for on treatment assessment prior to cycle 7 of weekly cisplatin chemotherapy  Patient's white cell count and platelet counts are acceptable for treatment.  However he has AKI with his elevated creatinine of 1.8.At baseline his creatinine is normal.  He also reports feeling more fatigued and lightheaded today.  I will therefore plan to give him 1 L of IV fluids but hold off on giving him any chemotherapy today.  I will bring him back again for fluids later this week as well as next week.  I will see him back in 2 weeks time with CBC with differential and CMP for possible IV fluids.  Patient will be completing his radiation treatment in 1 week's time.  I do not plan to give him any further weekly cisplatin chemotherapy.  I will plan to get repeat PET CT scan in 6 to 8 weeks following chemo radiation.  Given that he has stage II nasopharyngeal cancer adjuvant TPF chemotherapy could be considered based on his tolerance and counts and PET scan results   Visit Diagnosis 1. AKI (acute kidney injury) ([place])   2. Nasopharyngeal carcinoma ([place])      Dr. Randa Evens, MD, MPH [facility] [id number]  08/05/2019 8:35 AM

## 2019-08-06 ENCOUNTER — Ambulatory Visit: Payer: Medicare HMO | Admitting: Nurse Practitioner

## 2019-08-10 ENCOUNTER — Ambulatory Visit: Payer: Medicare HMO

## 2019-08-10 ENCOUNTER — Other Ambulatory Visit: Payer: Medicare HMO

## 2019-08-10 ENCOUNTER — Other Ambulatory Visit: Payer: Self-pay

## 2019-08-10 ENCOUNTER — Inpatient Hospital Stay: Payer: Medicare HMO

## 2019-08-10 ENCOUNTER — Ambulatory Visit: Payer: Medicare HMO | Admitting: Oncology

## 2019-08-10 DIAGNOSIS — Z5111 Encounter for antineoplastic chemotherapy: Secondary | ICD-10-CM | POA: Diagnosis not present

## 2019-08-10 DIAGNOSIS — C119 Malignant neoplasm of nasopharynx, unspecified: Secondary | ICD-10-CM | POA: Diagnosis not present

## 2019-08-10 DIAGNOSIS — R42 Dizziness and giddiness: Secondary | ICD-10-CM | POA: Diagnosis not present

## 2019-08-10 DIAGNOSIS — Z5189 Encounter for other specified aftercare: Secondary | ICD-10-CM | POA: Diagnosis not present

## 2019-08-10 DIAGNOSIS — D6959 Other secondary thrombocytopenia: Secondary | ICD-10-CM | POA: Diagnosis not present

## 2019-08-10 DIAGNOSIS — N179 Acute kidney failure, unspecified: Secondary | ICD-10-CM | POA: Diagnosis not present

## 2019-08-10 DIAGNOSIS — Z95828 Presence of other vascular implants and grafts: Secondary | ICD-10-CM

## 2019-08-10 DIAGNOSIS — R112 Nausea with vomiting, unspecified: Secondary | ICD-10-CM | POA: Diagnosis not present

## 2019-08-10 DIAGNOSIS — D701 Agranulocytosis secondary to cancer chemotherapy: Secondary | ICD-10-CM | POA: Diagnosis not present

## 2019-08-10 DIAGNOSIS — R7989 Other specified abnormal findings of blood chemistry: Secondary | ICD-10-CM | POA: Diagnosis not present

## 2019-08-10 LAB — CBC WITH DIFFERENTIAL/PLATELET
Abs Immature Granulocytes: 0.03 10*3/uL (ref 0.00–0.07)
Basophils Absolute: 0 10*3/uL (ref 0.0–0.1)
Basophils Relative: 0 %
Eosinophils Absolute: 0 10*3/uL (ref 0.0–0.5)
Eosinophils Relative: 0 %
HCT: 29.6 % — ABNORMAL LOW (ref 39.0–52.0)
Hemoglobin: 9.8 g/dL — ABNORMAL LOW (ref 13.0–17.0)
Immature Granulocytes: 0 %
Lymphocytes Relative: 6 %
Lymphs Abs: 0.5 10*3/uL — ABNORMAL LOW (ref 0.7–4.0)
MCH: 29.5 pg (ref 26.0–34.0)
MCHC: 33.1 g/dL (ref 30.0–36.0)
MCV: 89.2 fL (ref 80.0–100.0)
Monocytes Absolute: 0.8 10*3/uL (ref 0.1–1.0)
Monocytes Relative: 10 %
Neutro Abs: 7.4 10*3/uL (ref 1.7–7.7)
Neutrophils Relative %: 84 %
Platelets: 193 10*3/uL (ref 150–400)
RBC: 3.32 MIL/uL — ABNORMAL LOW (ref 4.22–5.81)
RDW: 17.4 % — ABNORMAL HIGH (ref 11.5–15.5)
WBC: 8.8 10*3/uL (ref 4.0–10.5)
nRBC: 0 % (ref 0.0–0.2)

## 2019-08-10 LAB — COMPREHENSIVE METABOLIC PANEL
ALT: 16 U/L (ref 0–44)
AST: 22 U/L (ref 15–41)
Albumin: 4 g/dL (ref 3.5–5.0)
Alkaline Phosphatase: 50 U/L (ref 38–126)
Anion gap: 11 (ref 5–15)
BUN: 15 mg/dL (ref 8–23)
CO2: 24 mmol/L (ref 22–32)
Calcium: 8.6 mg/dL — ABNORMAL LOW (ref 8.9–10.3)
Chloride: 100 mmol/L (ref 98–111)
Creatinine, Ser: 1.42 mg/dL — ABNORMAL HIGH (ref 0.61–1.24)
GFR calc Af Amer: 59 mL/min — ABNORMAL LOW (ref >60)
GFR calc non Af Amer: 51 mL/min — ABNORMAL LOW (ref >60)
Glucose, Bld: 129 mg/dL — ABNORMAL HIGH (ref 70–99)
Potassium: 4.2 mmol/L (ref 3.5–5.1)
Sodium: 135 mmol/L (ref 135–145)
Total Bilirubin: 0.7 mg/dL (ref 0.3–1.2)
Total Protein: 7.1 g/dL (ref 6.5–8.1)

## 2019-08-10 MED ORDER — HEPARIN SOD (PORK) LOCK FLUSH 100 UNIT/ML IV SOLN
500.0000 [IU] | Freq: Once | INTRAVENOUS | Status: AC
  Administered 2019-08-10: 500 [IU] via INTRAVENOUS
  Filled 2019-08-10: qty 5

## 2019-08-10 MED ORDER — SODIUM CHLORIDE 0.9% FLUSH
10.0000 mL | Freq: Once | INTRAVENOUS | Status: AC
  Administered 2019-08-10: 10 mL via INTRAVENOUS
  Filled 2019-08-10: qty 10

## 2019-08-10 MED ORDER — SODIUM CHLORIDE 0.9 % IV SOLN
INTRAVENOUS | Status: DC
  Filled 2019-08-10 (×2): qty 250

## 2019-08-11 ENCOUNTER — Telehealth: Payer: Self-pay

## 2019-08-11 NOTE — Telephone Encounter (Signed)
Faxed patient's home aid forms to [facility] and put copy up front for patient. [name]

## 2019-08-13 ENCOUNTER — Encounter: Payer: Self-pay | Admitting: Oncology

## 2019-08-13 ENCOUNTER — Other Ambulatory Visit: Payer: Self-pay | Admitting: *Deleted

## 2019-08-13 MED ORDER — MAGIC MOUTHWASH W/LIDOCAINE: 5.0000 mL | Freq: Four times a day (QID) | ORAL | 3 refills | Status: DC | PRN

## 2019-08-16 ENCOUNTER — Encounter: Payer: Self-pay | Admitting: Oncology

## 2019-08-16 NOTE — Progress Notes (Signed)
Patient is coming in for follow up he mentions he is having trouble with chewing and swallowing and has no appetite and had some episodes of nausea over the weekend.

## 2019-08-17 ENCOUNTER — Other Ambulatory Visit: Payer: Self-pay

## 2019-08-17 ENCOUNTER — Encounter: Payer: Self-pay | Admitting: Oncology

## 2019-08-17 ENCOUNTER — Inpatient Hospital Stay: Payer: Medicare HMO | Attending: Oncology

## 2019-08-17 ENCOUNTER — Inpatient Hospital Stay (HOSPITAL_BASED_OUTPATIENT_CLINIC_OR_DEPARTMENT_OTHER): Payer: Medicare HMO | Admitting: Oncology

## 2019-08-17 ENCOUNTER — Inpatient Hospital Stay: Payer: Medicare HMO

## 2019-08-17 VITALS — BP 134/83 | HR 79 | Temp 97.9°F | Resp 18 | Wt 196.9 lb

## 2019-08-17 DIAGNOSIS — Z87891 Personal history of nicotine dependence: Secondary | ICD-10-CM | POA: Diagnosis not present

## 2019-08-17 DIAGNOSIS — R944 Abnormal results of kidney function studies: Secondary | ICD-10-CM | POA: Insufficient documentation

## 2019-08-17 DIAGNOSIS — C119 Malignant neoplasm of nasopharynx, unspecified: Secondary | ICD-10-CM

## 2019-08-17 DIAGNOSIS — K208 Other esophagitis without bleeding: Secondary | ICD-10-CM | POA: Diagnosis not present

## 2019-08-17 DIAGNOSIS — Z5111 Encounter for antineoplastic chemotherapy: Secondary | ICD-10-CM

## 2019-08-17 DIAGNOSIS — Z923 Personal history of irradiation: Secondary | ICD-10-CM | POA: Diagnosis not present

## 2019-08-17 DIAGNOSIS — T66XXXA Radiation sickness, unspecified, initial encounter: Secondary | ICD-10-CM | POA: Diagnosis not present

## 2019-08-17 DIAGNOSIS — N179 Acute kidney failure, unspecified: Secondary | ICD-10-CM | POA: Diagnosis not present

## 2019-08-17 DIAGNOSIS — Z9221 Personal history of antineoplastic chemotherapy: Secondary | ICD-10-CM | POA: Insufficient documentation

## 2019-08-17 LAB — CBC WITH DIFFERENTIAL/PLATELET
Abs Immature Granulocytes: 0.02 10*3/uL (ref 0.00–0.07)
Basophils Absolute: 0 10*3/uL (ref 0.0–0.1)
Basophils Relative: 1 %
Eosinophils Absolute: 0.1 10*3/uL (ref 0.0–0.5)
Eosinophils Relative: 2 %
HCT: 31.1 % — ABNORMAL LOW (ref 39.0–52.0)
Hemoglobin: 10.4 g/dL — ABNORMAL LOW (ref 13.0–17.0)
Immature Granulocytes: 0 %
Lymphocytes Relative: 10 %
Lymphs Abs: 0.6 10*3/uL — ABNORMAL LOW (ref 0.7–4.0)
MCH: 30.6 pg (ref 26.0–34.0)
MCHC: 33.4 g/dL (ref 30.0–36.0)
MCV: 91.5 fL (ref 80.0–100.0)
Monocytes Absolute: 0.8 10*3/uL (ref 0.1–1.0)
Monocytes Relative: 13 %
Neutro Abs: 4.5 10*3/uL (ref 1.7–7.7)
Neutrophils Relative %: 74 %
Platelets: 137 10*3/uL — ABNORMAL LOW (ref 150–400)
RBC: 3.4 MIL/uL — ABNORMAL LOW (ref 4.22–5.81)
RDW: 18.3 % — ABNORMAL HIGH (ref 11.5–15.5)
WBC: 6.1 10*3/uL (ref 4.0–10.5)
nRBC: 0 % (ref 0.0–0.2)

## 2019-08-17 LAB — BASIC METABOLIC PANEL
Anion gap: 12 (ref 5–15)
BUN: 11 mg/dL (ref 8–23)
CO2: 25 mmol/L (ref 22–32)
Calcium: 9 mg/dL (ref 8.9–10.3)
Chloride: 99 mmol/L (ref 98–111)
Creatinine, Ser: 1.29 mg/dL — ABNORMAL HIGH (ref 0.61–1.24)
GFR calc Af Amer: >60 mL/min (ref >60)
GFR calc non Af Amer: 57 mL/min — ABNORMAL LOW (ref >60)
Glucose, Bld: 140 mg/dL — ABNORMAL HIGH (ref 70–99)
Potassium: 3.6 mmol/L (ref 3.5–5.1)
Sodium: 136 mmol/L (ref 135–145)

## 2019-08-17 LAB — MAGNESIUM: Magnesium: 1.3 mg/dL — ABNORMAL LOW (ref 1.7–2.4)

## 2019-08-17 MED ORDER — PROCHLORPERAZINE MALEATE 10 MG PO TABS: ORAL_TABLET | ORAL | 1 refills | Status: DC

## 2019-08-17 MED ORDER — SODIUM CHLORIDE 0.9% FLUSH
10.0000 mL | INTRAVENOUS | Status: DC | PRN
  Administered 2019-08-17: 10 mL via INTRAVENOUS
  Filled 2019-08-17: qty 10

## 2019-08-17 MED ORDER — SODIUM CHLORIDE 0.9 % IV SOLN
1.0000 g | Freq: Once | INTRAVENOUS | Status: AC
  Administered 2019-08-17: 1 g via INTRAVENOUS
  Filled 2019-08-17: qty 2

## 2019-08-17 MED ORDER — HEPARIN SOD (PORK) LOCK FLUSH 100 UNIT/ML IV SOLN
500.0000 [IU] | Freq: Once | INTRAVENOUS | Status: AC
  Administered 2019-08-17: 500 [IU] via INTRAVENOUS
  Filled 2019-08-17: qty 5

## 2019-08-17 MED ORDER — SODIUM CHLORIDE 0.9 % IV SOLN
Freq: Once | INTRAVENOUS | Status: AC
  Filled 2019-08-17: qty 250

## 2019-08-17 MED ORDER — ALPRAZOLAM 0.5 MG PO TABS: 0.5000 mg | ORAL_TABLET | Freq: Four times a day (QID) | ORAL | 0 refills | Status: DC | PRN

## 2019-08-19 NOTE — Progress Notes (Signed)
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 Pryor Ochoa for symptoms of ear pain and discharge. Patient also noticed associated hearing loss and tinnitus. Patient was noted to have erythema and edema in the left sphenopalatine fossa as well as an exophytic mass on NPL exam. CT soft tissue of the neck showed asymmetric soft tissue within the left nasopharynx measuring 2.4 x 2 x 3.2 cm. There is lateral bulging with encroachment upon the left parapharyngeal space without frank infiltration. No extension into oropharynx or nasal cavity. No skull base infiltration. No appreciable mass within the larynx. No pathologically enlarged cervical lymph nodes. 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  He was only able to receive 6 doses of weekly cisplatin 40 mg due to cytopenias and AKI  Interval history-patient still reports significant fatigue.  Appetite continues to be poor.  Pain during swallowing has improved.  ECOG PS- 1 Pain scale- 0 Opioid associated constipation- no  Review of systems- Review of Systems  Constitutional: Positive for malaise/fatigue. Negative for  chills, fever and weight loss.       Lack of appetite  HENT: Negative for congestion, ear discharge and nosebleeds.   Eyes: Negative for blurred vision.  Respiratory: Negative for cough, hemoptysis, sputum production, shortness of breath and wheezing.   Cardiovascular: Negative for chest pain, palpitations, orthopnea and claudication.  Gastrointestinal: Negative for abdominal pain, blood in stool, constipation, diarrhea, heartburn, melena, nausea and vomiting.  Genitourinary: Negative for dysuria, flank pain, frequency, hematuria and urgency.  Musculoskeletal: Negative for back pain, joint pain and myalgias.  Skin: Negative for rash.  Neurological: Negative for dizziness, tingling, focal weakness, seizures, weakness and headaches.  Endo/Heme/Allergies: Does not bruise/bleed easily.  Psychiatric/Behavioral: Negative for depression and suicidal ideas. The patient does not have insomnia.        Allergies  Allergen Reactions  . Opana [Oxymorphone Hcl]     Made him BLACKOUT  . Amoxicillin Other (See Comments)  . Oxymorphone Other (See Comments)  . Sulfa Antibiotics Other (See Comments)  . Sulfur     Childhood reaction      Past Medical History:  Diagnosis Date  . Alcohol abuse   . Benzodiazepine dependence ([place])   . Benzodiazepine withdrawal ([place])   . Chronic pain in right foot   . COPD (chronic obstructive pulmonary disease) ([place])   . Depression 08/19/2017  . Hepatitis C   . Hypertension   . 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[place])   . Seizures ([place]) 2010  . Sleep apnea   . Syncope    questionable vasovagal     Past Surgical History:  Procedure Laterality Date  . COLONOSCOPY WITH PROPOFOL N/A 12/16/2016   Procedure: COLONOSCOPY WITH PROPOFOL;  Surgeon: Lollie Sails, MD;  Location: [facility] ENDOSCOPY;  Service: Endoscopy;  Laterality: N/A;  . COLONOSCOPY WITH PROPOFOL N/A 03/14/2017   Procedure: COLONOSCOPY WITH PROPOFOL;  Surgeon: Lollie Sails, MD;  Location: [facility] ENDOSCOPY;   Service: Endoscopy;  Laterality: N/A;  . FOOT SURGERY Right 03/12/2002  . HEMORRHOID SURGERY    . LITHOTRIPSY    . MYRINGOTOMY WITH TUBE PLACEMENT Left 04/28/2019   Procedure: MYRINGOTOMY WITH TUBE PLACEMENT;  Surgeon: Carloyn Manner, MD;  Location: [place];  Service: ENT;  Laterality: Left;  . NASOPHARYNGOSCOPY N/A 04/28/2019   Procedure: NASOPHARYNGOSCOPY WITH BIOPSY;  Surgeon: Carloyn Manner, MD;  Location: [place];  Service: ENT;  Laterality: N/A;  . PORTA CATH INSERTION N/A 05/20/2019   Procedure: PORTA CATH INSERTION;  Surgeon: Algernon Huxley, MD;  Location: [place] CV LAB;  Service: Cardiovascular;  Laterality: N/A;  . TONSILLECTOMY      Social History   Socioeconomic History  . Marital status: Divorced    Spouse name: Not on file  . Number of children: Not on file  . Years of education: Not on file  . Highest education level: Not on file  Occupational History  . Not on file  Tobacco Use  . Smoking status: Former Smoker    Packs/day: 0.50    Years: 50.00    Pack years: 25.00    Types: Cigarettes    Quit date: 02/05/2019    Years since quitting: 0.5  . Smokeless tobacco: Never Used  Substance and Sexual Activity  . Alcohol use: Not Currently    Alcohol/week: 7.0 standard drinks    Types: 7 Standard drinks or equivalent per week  . Drug use: Not Currently  . Sexual activity: Not Currently  Other Topics Concern  . Not on file  Social History Narrative  . Not on file   Social Determinants of Health   Financial Resource Strain:   . Difficulty of Paying Living Expenses:   Food Insecurity: No Food Insecurity  . Worried About Charity fundraiser in the Last Year: Never true  . Ran Out of Food in the Last Year: Never true  Transportation Needs: No Transportation Needs  . Lack of Transportation (Medical): No  . Lack of Transportation (Non-Medical): No  Physical Activity: Inactive  . Days of Exercise per Week: 0 days  . Minutes of  Exercise per Session: 0 min  Stress:   . Feeling of Stress :   Social Connections:   . Frequency of Communication with Friends and Family:   . Frequency of Social Gatherings with Friends and Family:   . Attends Religious Services:   . Active Member of Clubs or Organizations:   . Attends Archivist Meetings:   Marland Kitchen Marital Status:   Intimate Partner Violence: Not At Risk  . Fear of Current or Ex-Partner: No  . Emotionally Abused: No  . Physically Abused: No  . Sexually Abused: No    Family History  Problem Relation Age of Onset  . Arthritis Mother   . Hypertension Mother   . Cancer Father   . Kidney disease Father   . Alcohol abuse Paternal Aunt   . Depression Maternal Grandfather      Current Outpatient Medications:  .  albuterol (VENTOLIN HFA) 108 (90 Base) MCG/ACT inhaler, Inhale 2 puffs into the lungs every 6 (six) hours as needed for wheezing or shortness of breath., Disp: 18 g, Rfl: 3 .  ALPRAZolam (XANAX) 0.5 MG tablet, Take 1 tablet (0.5 mg total) by mouth every 6 (six) hours as needed  for anxiety., Disp: 60 tablet, Rfl: 0 .  amLODipine (NORVASC) 5 MG tablet, Take 1 tablet (5 mg total) by mouth daily., Disp: 90 tablet, Rfl: 3 .  antiseptic oral rinse (BIOTENE) LIQD, 15 mLs by Mouth Rinse route as needed for dry mouth., Disp: , Rfl:  .  aspirin EC 81 MG tablet, Take 81 mg by mouth daily., Disp: , Rfl:  .  dexamethasone (DECADRON) 4 MG tablet, Take 2 tablets by mouth once a day on the day after chemotherapy and then take 2 tablets two times a day for 2 days. Take with food., Disp: 30 tablet, Rfl: 1 .  lidocaine (XYLOCAINE) 2 % solution, Use as directed 15 mLs in the mouth or throat as needed for mouth pain., Disp: 100 mL, Rfl: 0 .  lidocaine-prilocaine (EMLA) cream, Apply to affected area once, Disp: 30 g, Rfl: 3 .  magic mouthwash w/lidocaine SOLN, Take 5 mLs by mouth 4 (four) times daily as needed for mouth pain., Disp: 240 mL, Rfl: 3 .  Multiple Vitamin  (MULTIVITAMIN WITH MINERALS) TABS tablet, Take 1 tablet by mouth daily., Disp: , Rfl:  .  ondansetron (ZOFRAN) 4 MG tablet, Take 1 tablet (4 mg total) by mouth every 8 (eight) hours as needed for up to 10 doses for nausea or vomiting., Disp: 20 tablet, Rfl: 0 .  ondansetron (ZOFRAN) 8 MG tablet, TAKE 1 TABLET BY MOUTH TWICE DAILY AS NEEDED. START ON THE THRID DAY AFTER CHEMOTHERAPY., Disp: 30 tablet, Rfl: 1 .  oxyCODONE (OXYCONTIN) 20 mg 12 hr tablet, Take 1 tablet (20 mg total) by mouth every 12 (twelve) hours., Disp: 60 tablet, Rfl: 0 .  Oxycodone HCl 10 MG TABS, Take 1 tablet (10 mg total) by mouth every 4 (four) hours as needed., Disp: 180 tablet, Rfl: 0 .  prochlorperazine (COMPAZINE) 10 MG tablet, TAKE 1 TABLET BY MOUTH EVERY 6 HOURS AS NEEDED FOR NAUSEA OR VOMITING, Disp: 30 tablet, Rfl: 1 .  sucralfate (CARAFATE) 1 g tablet, Take 1 tablet (1 g total) by mouth 3 (three) times daily before meals. Dissolve in4to 5 tbs warm water, swish &swallow, Disp: 90 tablet, Rfl: 6 .  cyclobenzaprine (FLEXERIL) 5 MG tablet, Take 2 tablets (10 mg total) by mouth 2 (two) times daily as needed for muscle spasms. (Patient not taking: Reported on 08/02/2019), Disp: 45 tablet, Rfl: 1 .  fentaNYL (DURAGESIC) 12 MCG/HR, Place 1 patch onto the skin every 3 (three) days. (Patient not taking: Reported on 08/16/2019), Disp: 10 patch, Rfl: 0 .  fluconazole (DIFLUCAN) 100 MG tablet, Take 1 tablet (100 mg total) by mouth daily. (Patient not taking: Reported on 08/16/2019), Disp: 7 tablet, Rfl: 0 .  gabapentin (NEURONTIN) 100 MG capsule, Take 2 capsules (200 mg total) by mouth 3 (three) times daily. (Patient not taking: Reported on 08/02/2019), Disp: 180 capsule, Rfl: 3 .  hydrOXYzine (ATARAX/VISTARIL) 25 MG tablet, Take 1 to 2 tablets po QHS prn insomnia/anxiety (Patient not taking: Reported on 08/02/2019), Disp: 60 tablet, Rfl: 2 .  MAGNESIUM PO, Take by mouth., Disp: , Rfl:  .  pantoprazole (PROTONIX) 20 MG tablet, Take 1 tablet  (20 mg total) by mouth daily. (Patient not taking: Reported on 08/02/2019), Disp: 30 tablet, Rfl: 1 No current facility-administered medications for this visit.  Facility-Administered Medications Ordered in Other Visits:  .  sodium chloride flush (NS) 0.9 % injection 10 mL, 10 mL, Intravenous, PRN, Sindy Guadeloupe, MD, 10 mL at 08/17/19 0841  Physical exam:  Vitals:  08/17/19 0941  BP: 134/83  Pulse: 79  Resp: 18  Temp: 97.9 F (36.6 C)  TempSrc: Tympanic  SpO2: 100%  Weight: 196 lb 14.4 oz (89.3 kg)   Physical Exam Constitutional:      General: He is not in acute distress. Cardiovascular:     Rate and Rhythm: Normal rate and regular rhythm.     Heart sounds: Normal heart sounds.  Pulmonary:     Effort: Pulmonary effort is normal.     Breath sounds: Normal breath sounds.  Abdominal:     General: Bowel sounds are normal.     Palpations: Abdomen is soft.  Skin:    General: Skin is warm and dry.  Neurological:     Mental Status: He is alert and oriented to person, place, and time.      CMP Latest Ref Rng & Units 08/17/2019  Glucose 70 - 99 mg/dL 140(H)  BUN 8 - 23 mg/dL 11  Creatinine 0.61 - 1.24 mg/dL 1.29(H)  Sodium 135 - 145 mmol/L 136  Potassium 3.5 - 5.1 mmol/L 3.6  Chloride 98 - 111 mmol/L 99  CO2 22 - 32 mmol/L 25  Calcium 8.9 - 10.3 mg/dL 9.0  Total Protein 6.5 - 8.1 g/dL -  Total Bilirubin 0.3 - 1.2 mg/dL -  Alkaline Phos 38 - 126 U/L -  AST 15 - 41 U/L -  ALT 0 - 44 U/L -   CBC Latest Ref Rng & Units 08/17/2019  WBC 4.0 - 10.5 K/uL 6.1  Hemoglobin 13.0 - 17.0 g/dL 10.4(L)  Hematocrit 39.0 - 52.0 % 31.1(L)  Platelets 150 - 400 K/uL 137(L)     Assessment and plan- Patient is a 69 y.o. male with history of nasopharyngeal carcinoma stage II T2 N0 M0.  He is s/p concurrent chemoradiation with weekly cisplatin chemotherapy and this is a routine follow-up visit  Patient's creatinine did improve from 1.8-1.29 todayBut still not at his baseline at this time.  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 Nasopharyngeal carcinoma ([place])   2. AKI (acute kidney injury) ([place])   3. Hypomagnesemia   4. Radiation esophagitis      Dr. Randa Evens, MD, MPH [facility] [id number] 08/19/2019 11:22 AM

## 2019-08-26 ENCOUNTER — Telehealth: Payer: Self-pay

## 2019-08-26 NOTE — Telephone Encounter (Signed)
CONFIRMED AND SCREENED FOR 08-30-19 OV. 

## 2019-08-27 ENCOUNTER — Other Ambulatory Visit: Payer: Self-pay

## 2019-08-30 ENCOUNTER — Ambulatory Visit (INDEPENDENT_AMBULATORY_CARE_PROVIDER_SITE_OTHER): Payer: Medicare HMO | Admitting: Nurse Practitioner

## 2019-08-30 ENCOUNTER — Encounter: Payer: Self-pay | Admitting: Nurse Practitioner

## 2019-08-30 ENCOUNTER — Other Ambulatory Visit: Payer: Self-pay

## 2019-08-30 VITALS — BP 115/71 | HR 86 | Temp 97.9°F | Resp 16 | Ht 69.0 in | Wt 196.8 lb

## 2019-08-30 DIAGNOSIS — I10 Essential (primary) hypertension: Secondary | ICD-10-CM

## 2019-08-30 DIAGNOSIS — J449 Chronic obstructive pulmonary disease, unspecified: Secondary | ICD-10-CM | POA: Diagnosis not present

## 2019-08-30 DIAGNOSIS — F321 Major depressive disorder, single episode, moderate: Secondary | ICD-10-CM

## 2019-08-30 DIAGNOSIS — R69 Illness, unspecified: Secondary | ICD-10-CM | POA: Diagnosis not present

## 2019-08-30 MED ORDER — AMLODIPINE BESYLATE 5 MG PO TABS: 5.0000 mg | ORAL_TABLET | Freq: Every day | ORAL | 3 refills | Status: DC

## 2019-08-30 MED ORDER — MIRTAZAPINE 7.5 MG PO TABS: 7.5000 mg | ORAL_TABLET | Freq: Every day | ORAL | 3 refills | Status: DC

## 2019-08-30 NOTE — Progress Notes (Signed)
[facility] [place], [place] [address]  Internal MEDICINE  Office Visit Note  Patient Name: [name]  [id number]  [id number]  Date of Service: 09/11/2019  Chief Complaint  Patient presents with  . Hypertension  . Referral    possible pulmonary referral  . Medication Refill    amlodipine and hydroxyzine     The patient is here for follow up. He is concerned that he may need to have referral to pulmonology. He did experience some wheezing and shortness of breath while taking chemotherapy and radiation. He was prescribed a rescue inhaler. Chest x-ray was negative for any active or acute cardiopulmonary disease. He states that he no longer has issues with wheezing or shortness of breath.  He states that he does have very poor appetite. This has been happening since he had chemo and radiation treatment. States that tastes are just not the same. Hurts to chew food.       Current Medication: Outpatient Encounter Medications as of 08/30/2019  Medication Sig  . albuterol (VENTOLIN HFA) 108 (90 Base) MCG/ACT inhaler Inhale 2 puffs into the lungs every 6 (six) hours as needed for wheezing or shortness of breath.  . ALPRAZolam (XANAX) 0.5 MG tablet Take 1 tablet (0.5 mg total) by mouth every 6 (six) hours as needed for anxiety.  Marland Kitchen amLODipine (NORVASC) 5 MG tablet Take 1 tablet (5 mg total) by mouth daily.  Marland Kitchen antiseptic oral rinse (BIOTENE) LIQD 15 mLs by Mouth Rinse route as needed for dry mouth.  Marland Kitchen aspirin EC 81 MG tablet Take 81 mg by mouth daily.  . cyclobenzaprine (FLEXERIL) 5 MG tablet Take 2 tablets (10 mg total) by mouth 2 (two) times daily as needed for muscle spasms.  Marland Kitchen gabapentin (NEURONTIN) 100 MG capsule Take 2 capsules (200 mg total) by mouth 3 (three) times daily.  . hydrOXYzine (ATARAX/VISTARIL) 25 MG tablet Take 1 to 2 tablets po QHS prn insomnia/anxiety  . lidocaine (XYLOCAINE) 2 % solution Use as directed 15 mLs in the mouth or throat  as needed for mouth pain.  Marland Kitchen MAGNESIUM PO Take by mouth.  . Multiple Vitamin (MULTIVITAMIN WITH MINERALS) TABS tablet Take 1 tablet by mouth daily.  . ondansetron (ZOFRAN) 4 MG tablet Take 1 tablet (4 mg total) by mouth every 8 (eight) hours as needed for up to 10 doses for nausea or vomiting.  . ondansetron (ZOFRAN) 8 MG tablet TAKE 1 TABLET BY MOUTH TWICE DAILY AS NEEDED. START ON THE THRID DAY AFTER CHEMOTHERAPY. (Patient not taking: Reported on 08/31/2019)  . prochlorperazine (COMPAZINE) 10 MG tablet TAKE 1 TABLET BY MOUTH EVERY 6 HOURS AS NEEDED FOR NAUSEA OR VOMITING  . [DISCONTINUED] amLODipine (NORVASC) 5 MG tablet Take 1 tablet (5 mg total) by mouth daily.  . [DISCONTINUED] oxyCODONE (OXYCONTIN) 20 mg 12 hr tablet Take 1 tablet (20 mg total) by mouth every 12 (twelve) hours.  . [DISCONTINUED] Oxycodone HCl 10 MG TABS Take 1 tablet (10 mg total) by mouth every 4 (four) hours as needed.  . mirtazapine (REMERON) 7.5 MG tablet Take 1 tablet (7.5 mg total) by mouth at bedtime.  . [DISCONTINUED] dexamethasone (DECADRON) 4 MG tablet Take 2 tablets by mouth once a day on the day after chemotherapy and then take 2 tablets two times a day for 2 days. Take with food. (Patient not taking: Reported on 08/30/2019)  . [DISCONTINUED] fentaNYL (DURAGESIC) 12 MCG/HR Place 1 patch onto the skin every 3 (three) days. (Patient  not taking: Reported on 08/16/2019)  . [DISCONTINUED] fluconazole (DIFLUCAN) 100 MG tablet Take 1 tablet (100 mg total) by mouth daily. (Patient not taking: Reported on 08/16/2019)  . [DISCONTINUED] lidocaine-prilocaine (EMLA) cream Apply to affected area once (Patient not taking: Reported on 08/30/2019)  . [DISCONTINUED] magic mouthwash w/lidocaine SOLN Take 5 mLs by mouth 4 (four) times daily as needed for mouth pain. (Patient not taking: Reported on 08/30/2019)  . [DISCONTINUED] pantoprazole (PROTONIX) 20 MG tablet Take 1 tablet (20 mg total) by mouth daily. (Patient not taking: Reported on  08/02/2019)  . [DISCONTINUED] sucralfate (CARAFATE) 1 g tablet Take 1 tablet (1 g total) by mouth 3 (three) times daily before meals. Dissolve in4to 5 tbs warm water, swish &swallow (Patient not taking: Reported on 08/30/2019)   Facility-Administered Encounter Medications as of 08/30/2019  Medication  . sodium chloride flush (NS) 0.9 % injection 10 mL    Surgical History: Past Surgical History:  Procedure Laterality Date  . COLONOSCOPY WITH PROPOFOL N/A 12/16/2016   Procedure: COLONOSCOPY WITH PROPOFOL;  Surgeon: Lollie Sails, MD;  Location: [facility] ENDOSCOPY;  Service: Endoscopy;  Laterality: N/A;  . COLONOSCOPY WITH PROPOFOL N/A 03/14/2017   Procedure: COLONOSCOPY WITH PROPOFOL;  Surgeon: Lollie Sails, MD;  Location: [facility] ENDOSCOPY;  Service: Endoscopy;  Laterality: N/A;  . FOOT SURGERY Right 03/12/2002  . HEMORRHOID SURGERY    . LITHOTRIPSY    . MYRINGOTOMY WITH TUBE PLACEMENT Left 04/28/2019   Procedure: MYRINGOTOMY WITH TUBE PLACEMENT;  Surgeon: Carloyn Manner, MD;  Location: [place];  Service: ENT;  Laterality: Left;  . NASOPHARYNGOSCOPY N/A 04/28/2019   Procedure: NASOPHARYNGOSCOPY WITH BIOPSY;  Surgeon: Carloyn Manner, MD;  Location: [place];  Service: ENT;  Laterality: N/A;  . PORTA CATH INSERTION N/A 05/20/2019   Procedure: PORTA CATH INSERTION;  Surgeon: Algernon Huxley, MD;  Location: [facility] [facility];  Service: Cardiovascular;  Laterality: N/A;  . TONSILLECTOMY      Medical History: Past Medical History:  Diagnosis Date  . Alcohol abuse   . Benzodiazepine dependence ([place])   . Benzodiazepine withdrawal ([place])   . Chronic pain in right foot   . COPD (chronic obstructive pulmonary disease) ([place])   . Depression 08/19/2017  . Hepatitis C   . Hypertension   . 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 Seizures ([place]) 2010  . Sleep apnea   . Syncope    questionable vasovagal    Family History: Family History  Problem Relation Age of Onset  .  Arthritis Mother   . Hypertension Mother   . Cancer Father   . Kidney disease Father   . Alcohol abuse Paternal Aunt   . Depression Maternal Grandfather     Social History   Socioeconomic History  . Marital status: Divorced    Spouse name: Not on file  . Number of children: Not on file  . Years of education: Not on file  . Highest education level: Not on file  Occupational History  . Not on file  Tobacco Use  . Smoking status: Former Smoker    Packs/day: 0.50    Years: 50.00    Pack years: 25.00    Types: Cigarettes    Quit date: 02/05/2019    Years since quitting: 0.5  . Smokeless tobacco: Never Used  Substance and Sexual Activity  . Alcohol use: Not Currently    Alcohol/week: 7.0 standard drinks    Types: 7 Standard drinks or equivalent per week  . Drug use:  Not Currently  . Sexual activity: Not Currently  Other Topics Concern  . Not on file  Social History Narrative  . Not on file   Social Determinants of Health   Financial Resource Strain:   . Difficulty of Paying Living Expenses:   Food Insecurity: No Food Insecurity  . Worried About Charity fundraiser in the Last Year: Never true  . Ran Out of Food in the Last Year: Never true  Transportation Needs: No Transportation Needs  . Lack of Transportation (Medical): No  . Lack of Transportation (Non-Medical): No  Physical Activity: Inactive  . Days of Exercise per Week: 0 days  . Minutes of Exercise per Session: 0 min  Stress:   . Feeling of Stress :   Social Connections:   . Frequency of Communication with Friends and Family:   . Frequency of Social Gatherings with Friends and Family:   . Attends Religious Services:   . Active Member of Clubs or Organizations:   . Attends Archivist Meetings:   Marland Kitchen Marital Status:   Intimate Partner Violence: Not At Risk  . Fear of Current or Ex-Partner: No  . Emotionally Abused: No  . Physically Abused: No  . Sexually Abused: No      Review of Systems   Constitutional: Positive for fatigue. Negative for activity change, chills and unexpected weight change.  HENT: Positive for mouth sores. Negative for congestion, rhinorrhea, sneezing and sore throat.        Having mouth pain   Respiratory: Negative for cough, chest tightness, shortness of breath and wheezing.   Cardiovascular: Negative for chest pain and palpitations.  Gastrointestinal: Negative for abdominal pain, constipation, diarrhea, nausea and vomiting.  Endocrine: Negative for cold intolerance, heat intolerance, polydipsia and polyuria.  Genitourinary: Negative for dysuria and frequency.  Musculoskeletal: Positive for arthralgias. Negative for back pain, joint swelling and neck pain.         Bilateral foot pain.   Skin: Negative for rash.       Skin on both sides of neck and posterior aspect of neck burned from radiation treatment .  Allergic/Immunologic: Negative for environmental allergies.  Neurological: Negative for dizziness, tremors, numbness and headaches.  Hematological: Negative for adenopathy. Does not bruise/bleed easily.  Psychiatric/Behavioral: Negative for behavioral problems (Depression), sleep disturbance and suicidal ideas. The patient is nervous/anxious.     Today's Vitals   08/30/19 1440  BP: 115/71  Pulse: 86  Resp: 16  Temp: 97.9 F (36.6 C)  SpO2: 99%  Weight: 196 lb 12.8 oz (89.3 kg)  Height: 5\' 9"  (1.753 m)   Body mass index is 29.06 kg/m.  Physical Exam Vitals and nursing note reviewed.  Constitutional:      General: He is not in acute distress.    Appearance: Normal appearance. He is well-developed. He is not diaphoretic.  HENT:     Head: Normocephalic and atraumatic.     Mouth/Throat:     Mouth: Oral lesions present.     Pharynx: No oropharyngeal exudate.  Eyes:     Pupils: Pupils are equal, round, and reactive to light.  Neck:     Thyroid: No thyromegaly.     Vascular: No carotid bruit or JVD.     Trachea: No tracheal deviation.      Comments: Burned skin to the neck. Red and tender. No second degree lesions or blistering present today.  Cardiovascular:     Rate and Rhythm: Normal rate and regular rhythm.  Pulses: Normal pulses.     Heart sounds: Normal heart sounds. No murmur. No friction rub. No gallop.      Comments: Intermittent atopic beats present.  Pulmonary:     Effort: Pulmonary effort is normal. No respiratory distress.     Breath sounds: Normal breath sounds. No wheezing or rales.  Chest:     Chest wall: No tenderness.  Abdominal:     General: Bowel sounds are normal.     Palpations: Abdomen is soft.     Tenderness: There is no abdominal tenderness.  Musculoskeletal:        General: Normal range of motion.     Cervical back: Normal range of motion and neck supple.  Lymphadenopathy:     Cervical: No cervical adenopathy.  Skin:    General: Skin is warm and dry.  Neurological:     General: No focal deficit present.     Mental Status: He is alert and oriented to person, place, and time.     Cranial Nerves: No cranial nerve deficit.  Psychiatric:        Behavior: Behavior normal.        Thought Content: Thought content normal.        Judgment: Judgment normal.    Assessment/Plan: 1. Essential hypertension Well managed. Continue norvasc as prescribed  - amLODipine (NORVASC) 5 MG tablet; Take 1 tablet (5 mg total) by mouth daily.  Dispense: 90 tablet; Refill: 3  2. COPD, mild ([place]) Use rescue inhaler as needed and as prescribed.   3. Depression, major, single episode, moderate ([place]) Start remeron 7.5mg  at bedtime to help with depression/anxiety/sleep. Will reassess at next visit and adjust dosing as indicated.  - mirtazapine (REMERON) 7.5 MG tablet; Take 1 tablet (7.5 mg total) by mouth at bedtime.  Dispense: 30 tablet; Refill: 3  General Counseling: [name] verbalizes understanding of the findings of todays visit and agrees with plan of treatment. I have discussed any further diagnostic  evaluation that may be needed or ordered today. We also reviewed his medications today. he has been encouraged to call the office with any questions or concerns that should arise related to todays visit.   This patient was seen by [place] with Dr Lavera Guise as a part of collaborative care agreement  Meds ordered this encounter  Medications  . mirtazapine (REMERON) 7.5 MG tablet    Sig: Take 1 tablet (7.5 mg total) by mouth at bedtime.    Dispense:  30 tablet    Refill:  3    Order Specific Question:   Supervising Provider    Answer:   Lavera Guise [id number]  . amLODipine (NORVASC) 5 MG tablet    Sig: Take 1 tablet (5 mg total) by mouth daily.    Dispense:  90 tablet    Refill:  3    Order Specific Question:   Supervising Provider    Answer:   Lavera Guise [id number]    Total time spent: 30 Minutes  Time spent includes review of chart, medications, test results, and follow up plan with the patient.      Dr Lavera Guise Internal medicine

## 2019-08-31 ENCOUNTER — Encounter: Payer: Self-pay | Admitting: Oncology

## 2019-08-31 ENCOUNTER — Other Ambulatory Visit: Payer: Self-pay | Admitting: *Deleted

## 2019-08-31 ENCOUNTER — Inpatient Hospital Stay (HOSPITAL_BASED_OUTPATIENT_CLINIC_OR_DEPARTMENT_OTHER): Payer: Medicare HMO | Admitting: Oncology

## 2019-08-31 ENCOUNTER — Inpatient Hospital Stay: Payer: Medicare HMO

## 2019-08-31 VITALS — BP 120/58 | HR 79 | Temp 98.7°F | Ht 69.0 in | Wt 195.2 lb

## 2019-08-31 DIAGNOSIS — C119 Malignant neoplasm of nasopharynx, unspecified: Secondary | ICD-10-CM

## 2019-08-31 DIAGNOSIS — R944 Abnormal results of kidney function studies: Secondary | ICD-10-CM | POA: Diagnosis not present

## 2019-08-31 DIAGNOSIS — N179 Acute kidney failure, unspecified: Secondary | ICD-10-CM

## 2019-08-31 DIAGNOSIS — Z87891 Personal history of nicotine dependence: Secondary | ICD-10-CM | POA: Diagnosis not present

## 2019-08-31 DIAGNOSIS — G893 Neoplasm related pain (acute) (chronic): Secondary | ICD-10-CM

## 2019-08-31 DIAGNOSIS — Z923 Personal history of irradiation: Secondary | ICD-10-CM | POA: Diagnosis not present

## 2019-08-31 DIAGNOSIS — K208 Other esophagitis without bleeding: Secondary | ICD-10-CM | POA: Diagnosis not present

## 2019-08-31 DIAGNOSIS — Z9221 Personal history of antineoplastic chemotherapy: Secondary | ICD-10-CM | POA: Diagnosis not present

## 2019-08-31 LAB — CBC WITH DIFFERENTIAL/PLATELET
Abs Immature Granulocytes: 0.01 10*3/uL (ref 0.00–0.07)
Basophils Absolute: 0 10*3/uL (ref 0.0–0.1)
Basophils Relative: 0 %
Eosinophils Absolute: 0.2 10*3/uL (ref 0.0–0.5)
Eosinophils Relative: 3 %
HCT: 30.4 % — ABNORMAL LOW (ref 39.0–52.0)
Hemoglobin: 10.2 g/dL — ABNORMAL LOW (ref 13.0–17.0)
Immature Granulocytes: 0 %
Lymphocytes Relative: 12 %
Lymphs Abs: 0.6 10*3/uL — ABNORMAL LOW (ref 0.7–4.0)
MCH: 31.1 pg (ref 26.0–34.0)
MCHC: 33.6 g/dL (ref 30.0–36.0)
MCV: 92.7 fL (ref 80.0–100.0)
Monocytes Absolute: 0.5 10*3/uL (ref 0.1–1.0)
Monocytes Relative: 9 %
Neutro Abs: 3.9 10*3/uL (ref 1.7–7.7)
Neutrophils Relative %: 76 %
Platelets: 146 10*3/uL — ABNORMAL LOW (ref 150–400)
RBC: 3.28 MIL/uL — ABNORMAL LOW (ref 4.22–5.81)
RDW: 18 % — ABNORMAL HIGH (ref 11.5–15.5)
WBC: 5.2 10*3/uL (ref 4.0–10.5)
nRBC: 0 % (ref 0.0–0.2)

## 2019-08-31 LAB — BASIC METABOLIC PANEL WITH GFR
Anion gap: 11 (ref 5–15)
BUN: 9 mg/dL (ref 8–23)
CO2: 25 mmol/L (ref 22–32)
Calcium: 8.5 mg/dL — ABNORMAL LOW (ref 8.9–10.3)
Chloride: 102 mmol/L (ref 98–111)
Creatinine, Ser: 1.08 mg/dL (ref 0.61–1.24)
GFR calc Af Amer: >60 mL/min
GFR calc non Af Amer: >60 mL/min
Glucose, Bld: 142 mg/dL — ABNORMAL HIGH (ref 70–99)
Potassium: 3.8 mmol/L (ref 3.5–5.1)
Sodium: 138 mmol/L (ref 135–145)

## 2019-08-31 LAB — MAGNESIUM: Magnesium: 1.6 mg/dL — ABNORMAL LOW (ref 1.7–2.4)

## 2019-08-31 MED ORDER — OXYCODONE HCL ER 20 MG PO T12A: 20.0000 mg | EXTENDED_RELEASE_TABLET | Freq: Two times a day (BID) | ORAL | 0 refills | Status: DC

## 2019-08-31 MED ORDER — OXYCODONE HCL 10 MG PO TABS: 10.0000 mg | ORAL_TABLET | ORAL | 0 refills | Status: DC | PRN

## 2019-08-31 MED ORDER — HEPARIN SOD (PORK) LOCK FLUSH 100 UNIT/ML IV SOLN
500.0000 [IU] | Freq: Once | INTRAVENOUS | Status: AC
  Filled 2019-08-31: qty 5

## 2019-08-31 MED ORDER — SODIUM CHLORIDE 0.9% FLUSH
10.0000 mL | Freq: Once | INTRAVENOUS | Status: AC
  Administered 2019-08-31: 10 mL via INTRAVENOUS
  Filled 2019-08-31: qty 10

## 2019-08-31 NOTE — Progress Notes (Signed)
Patient stated that he had been having some neuropathy on his bilateral feet.

## 2019-09-02 ENCOUNTER — Other Ambulatory Visit: Payer: Self-pay | Admitting: *Deleted

## 2019-09-02 DIAGNOSIS — C119 Malignant neoplasm of nasopharynx, unspecified: Secondary | ICD-10-CM

## 2019-09-02 NOTE — Progress Notes (Signed)
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 Pryor Ochoa for symptoms of ear pain and discharge. Patient also noticed associated hearing loss and tinnitus. Patient was noted to have erythema and edema in the left sphenopalatine fossa as well as an exophytic mass on NPL exam. CT soft tissue of the neck showed asymmetric soft tissue within the left nasopharynx measuring 2.4 x 2 x 3.2 cm. There is lateral bulging with encroachment upon the left parapharyngeal space without frank infiltration. No extension into oropharynx or nasal cavity. No skull base infiltration. No appreciable mass within the larynx. No pathologically enlarged cervical lymph nodes. 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  He was only able to receive 6 doses of weekly cisplatin 40 mg due to cytopenias and AKI  Interval history- still reports pain during swallowing and is requiring oxycontin and oxycodone.  Reports ongoing fatigue.  Weight has remained stable in the last 2 weeks.  States that he is drinking enough fluids at home  ECOG PS- 1 Pain scale- 3 Opioid associated constipation- no  Review of systems-  Review of Systems  Constitutional: Positive for malaise/fatigue. Negative for chills, fever and weight loss.  HENT: Negative for congestion, ear discharge and nosebleeds.   Eyes: Negative for blurred vision.  Respiratory: Negative for cough, hemoptysis, sputum production, shortness of breath and wheezing.   Cardiovascular: Negative for chest pain, palpitations, orthopnea and claudication.  Gastrointestinal: Negative for abdominal pain, blood in stool, constipation, diarrhea, heartburn, melena, nausea and vomiting.       Pain during swallowing  Genitourinary: Negative for dysuria, flank pain, frequency, hematuria and urgency.  Musculoskeletal: Negative for back pain, joint pain and myalgias.  Skin: Negative for rash.  Neurological: Negative for dizziness, tingling, focal weakness, seizures, weakness and headaches.  Endo/Heme/Allergies: Does not bruise/bleed easily.  Psychiatric/Behavioral: Negative for depression and suicidal ideas. The patient does not have insomnia.       Allergies  Allergen Reactions  . Opana [Oxymorphone Hcl]     Made him BLACKOUT  . Amoxicillin Other (See Comments)  . Oxymorphone Other (See Comments)  . Sulfa Antibiotics Other (See Comments)  . Sulfur     Childhood reaction      Past Medical History:  Diagnosis Date  . Alcohol abuse   . Benzodiazepine dependence ([place])   . Benzodiazepine withdrawal ([place])   . Chronic pain in right foot   . COPD (chronic obstructive pulmonary disease) ([place])   . Depression 08/19/2017  . Hepatitis C   . Hypertension   . 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[place])   . Seizures ([place]) 2010  . Sleep apnea   . Syncope    questionable vasovagal     Past Surgical History:  Procedure Laterality Date  . COLONOSCOPY WITH PROPOFOL N/A 12/16/2016  Procedure: COLONOSCOPY WITH PROPOFOL;  Surgeon: Lollie Sails, MD;  Location: [facility] ENDOSCOPY;  Service: Endoscopy;  Laterality: N/A;  . COLONOSCOPY WITH PROPOFOL N/A 03/14/2017   Procedure:  COLONOSCOPY WITH PROPOFOL;  Surgeon: Lollie Sails, MD;  Location: [facility] ENDOSCOPY;  Service: Endoscopy;  Laterality: N/A;  . FOOT SURGERY Right 03/12/2002  . HEMORRHOID SURGERY    . LITHOTRIPSY    . MYRINGOTOMY WITH TUBE PLACEMENT Left 04/28/2019   Procedure: MYRINGOTOMY WITH TUBE PLACEMENT;  Surgeon: Carloyn Manner, MD;  Location: [facility];  Service: ENT;  Laterality: Left;  . NASOPHARYNGOSCOPY N/A 04/28/2019   Procedure: NASOPHARYNGOSCOPY WITH BIOPSY;  Surgeon: Carloyn Manner, MD;  Location: [place];  Service: ENT;  Laterality: N/A;  . PORTA CATH INSERTION N/A 05/20/2019   Procedure: PORTA CATH INSERTION;  Surgeon: Algernon Huxley, MD;  Location: [facility];  Service: Cardiovascular;  Laterality: N/A;  . TONSILLECTOMY      Social History   Socioeconomic History  . Marital status: Divorced    Spouse name: Not on file  . Number of children: Not on file  . Years of education: Not on file  . Highest education level: Not on file  Occupational History  . Not on file  Tobacco Use  . Smoking status: Former Smoker    Packs/day: 0.50    Years: 50.00    Pack years: 25.00    Types: Cigarettes    Quit date: 02/05/2019    Years since quitting: 0.5  . Smokeless tobacco: Never Used  Substance and Sexual Activity  . Alcohol use: Not Currently    Alcohol/week: 7.0 standard drinks    Types: 7 Standard drinks or equivalent per week  . Drug use: Not Currently  . Sexual activity: Not Currently  Other Topics Concern  . Not on file  Social History Narrative  . Not on file   Social Determinants of Health   Financial Resource Strain:   . Difficulty of Paying Living Expenses:   Food Insecurity: No Food Insecurity  . Worried About Charity fundraiser in the Last Year: Never true  . Ran Out of Food in the Last Year: Never true  Transportation Needs: No Transportation Needs  . Lack of Transportation (Medical): No  . Lack of Transportation (Non-Medical):  No  Physical Activity: Inactive  . Days of Exercise per Week: 0 days  . Minutes of Exercise per Session: 0 min  Stress:   . Feeling of Stress :   Social Connections:   . Frequency of Communication with Friends and Family:   . Frequency of Social Gatherings with Friends and Family:   . Attends Religious Services:   . Active Member of Clubs or Organizations:   . Attends Archivist Meetings:   Marland Kitchen Marital Status:   Intimate Partner Violence: Not At Risk  . Fear of Current or Ex-Partner: No  . Emotionally Abused: No  . Physically Abused: No  . Sexually Abused: No    Family History  Problem Relation Age of Onset  . Arthritis Mother   . Hypertension Mother   . Cancer Father   . Kidney disease Father   . Alcohol abuse Paternal Aunt   . Depression Maternal Grandfather      Current Outpatient Medications:  .  albuterol (VENTOLIN HFA) 108 (90 Base) MCG/ACT inhaler, Inhale 2 puffs into the lungs every 6 (six) hours as needed for wheezing or shortness of breath., Disp: 18 g, Rfl: 3 .  ALPRAZolam (  XANAX) 0.5 MG tablet, Take 1 tablet (0.5 mg total) by mouth every 6 (six) hours as needed for anxiety., Disp: 60 tablet, Rfl: 0 .  amLODipine (NORVASC) 5 MG tablet, Take 1 tablet (5 mg total) by mouth daily., Disp: 90 tablet, Rfl: 3 .  antiseptic oral rinse (BIOTENE) LIQD, 15 mLs by Mouth Rinse route as needed for dry mouth., Disp: , Rfl:  .  aspirin EC 81 MG tablet, Take 81 mg by mouth daily., Disp: , Rfl:  .  cyclobenzaprine (FLEXERIL) 5 MG tablet, Take 2 tablets (10 mg total) by mouth 2 (two) times daily as needed for muscle spasms., Disp: 45 tablet, Rfl: 1 .  gabapentin (NEURONTIN) 100 MG capsule, Take 2 capsules (200 mg total) by mouth 3 (three) times daily., Disp: 180 capsule, Rfl: 3 .  hydrOXYzine (ATARAX/VISTARIL) 25 MG tablet, Take 1 to 2 tablets po QHS prn insomnia/anxiety, Disp: 60 tablet, Rfl: 2 .  lidocaine (XYLOCAINE) 2 % solution, Use as directed 15 mLs in the mouth or  throat as needed for mouth pain., Disp: 100 mL, Rfl: 0 .  Magnesium 500 MG CAPS, Take 1 capsule by mouth in the morning and at bedtime., Disp: , Rfl:  .  MAGNESIUM PO, Take by mouth., Disp: , Rfl:  .  mirtazapine (REMERON) 7.5 MG tablet, Take 1 tablet (7.5 mg total) by mouth at bedtime., Disp: 30 tablet, Rfl: 3 .  Multiple Vitamin (MULTIVITAMIN WITH MINERALS) TABS tablet, Take 1 tablet by mouth daily., Disp: , Rfl:  .  oxyCODONE (OXYCONTIN) 20 mg 12 hr tablet, Take 1 tablet (20 mg total) by mouth every 12 (twelve) hours., Disp: 60 tablet, Rfl: 0 .  Oxycodone HCl 10 MG TABS, Take 1 tablet (10 mg total) by mouth every 4 (four) hours as needed., Disp: 120 tablet, Rfl: 0 .  prochlorperazine (COMPAZINE) 10 MG tablet, TAKE 1 TABLET BY MOUTH EVERY 6 HOURS AS NEEDED FOR NAUSEA OR VOMITING, Disp: 30 tablet, Rfl: 1 .  ondansetron (ZOFRAN) 4 MG tablet, Take 1 tablet (4 mg total) by mouth every 8 (eight) hours as needed for up to 10 doses for nausea or vomiting. (Patient not taking: Reported on 08/31/2019), Disp: 20 tablet, Rfl: 0 .  ondansetron (ZOFRAN) 8 MG tablet, TAKE 1 TABLET BY MOUTH TWICE DAILY AS NEEDED. START ON THE THRID DAY AFTER CHEMOTHERAPY. (Patient not taking: Reported on 08/31/2019), Disp: 30 tablet, Rfl: 1 No current facility-administered medications for this visit.  Facility-Administered Medications Ordered in Other Visits:  .  heparin lock flush 100 unit/mL, 500 Units, Intravenous, Once, Randa Evens C, MD .  sodium chloride flush (NS) 0.9 % injection 10 mL, 10 mL, Intravenous, PRN, Sindy Guadeloupe, MD, 10 mL at 08/17/19 0841  Physical exam:  Vitals:   08/31/19 0943  BP: (!) 120/58  Pulse: 79  Temp: 98.7 F (37.1 C)  TempSrc: Tympanic  Weight: 195 lb 3.2 oz (88.5 kg)  Height: 5\' 9"  (1.753 m)   Physical Exam Constitutional:      General: He is not in acute distress. HENT:     Head: Normocephalic and atraumatic.  Eyes:     Pupils: Pupils are equal, round, and reactive to light.    Cardiovascular:     Rate and Rhythm: Normal rate and regular rhythm.     Heart sounds: Normal heart sounds.  Pulmonary:     Effort: Pulmonary effort is normal.     Breath sounds: Normal breath sounds.  Abdominal:     General: Bowel  sounds are normal.     Palpations: Abdomen is soft.  Musculoskeletal:     Cervical back: Normal range of motion.  Skin:    General: Skin is warm and dry.  Neurological:     Mental Status: He is alert and oriented to person, place, and time.      CMP Latest Ref Rng & Units 08/31/2019  Glucose 70 - 99 mg/dL 142(H)  BUN 8 - 23 mg/dL 9  Creatinine 0.61 - 1.24 mg/dL 1.08  Sodium 135 - 145 mmol/L 138  Potassium 3.5 - 5.1 mmol/L 3.8  Chloride 98 - 111 mmol/L 102  CO2 22 - 32 mmol/L 25  Calcium 8.9 - 10.3 mg/dL 8.5(L)  Total Protein 6.5 - 8.1 g/dL -  Total Bilirubin 0.3 - 1.2 mg/dL -  Alkaline Phos 38 - 126 U/L -  AST 15 - 41 U/L -  ALT 0 - 44 U/L -   CBC Latest Ref Rng & Units 08/31/2019  WBC 4.0 - 10.5 K/uL 5.2  Hemoglobin 13.0 - 17.0 g/dL 10.2(L)  Hematocrit 39.0 - 52.0 % 30.4(L)  Platelets 150 - 400 K/uL 146(L)     Assessment and plan- Patient is a 69 y.o. male with history of nasopharyngeal carcinoma stage II T2 N0 M0.  He is s/p concurrent chemoradiation with weekly cisplatin chemotherapy.  This is a routine follow-up visit  1.  Patient is showing some signs of clinical improvement.  Oral intake is improving although he does have pain during swallowing.  We will continue OxyContin and as needed oxycodone and renewed those medications for him  2.  Hypomagnesemia: Patient would like to try oral magnesium replacements.  He is currently taking 1 tablet of 500 mg and I have asked him to take twice a day.  3. Patient is due for repeat PET CT scan on 09/21/2019.  I will see him after PET scans with CBC with differential CMP and magnesium   Visit Diagnosis 1. Nasopharyngeal carcinoma ([place])   2. Hypomagnesemia   3. Neoplasm related pain      Dr.  Randa Evens, MD, MPH [facility] [id number] 09/02/2019 1:28 PM

## 2019-09-03 ENCOUNTER — Telehealth: Payer: Self-pay

## 2019-09-03 NOTE — Telephone Encounter (Signed)
Completed medical record request and mailed requested records to  [employer] [place] [address]

## 2019-09-08 ENCOUNTER — Ambulatory Visit
Admission: RE | Admit: 2019-09-08 | Discharge: 2019-09-08 | Disposition: A | Discharge status: Home / Self Care | Payer: Medicare HMO | Source: Ambulatory Visit | Attending: Radiation Oncology | Admitting: Radiation Oncology

## 2019-09-08 ENCOUNTER — Other Ambulatory Visit: Payer: Self-pay

## 2019-09-08 ENCOUNTER — Encounter: Payer: Self-pay | Admitting: Radiation Oncology

## 2019-09-08 VITALS — BP 123/78 | HR 100 | Temp 97.4°F | Wt 195.0 lb

## 2019-09-08 DIAGNOSIS — C119 Malignant neoplasm of nasopharynx, unspecified: Secondary | ICD-10-CM | POA: Diagnosis not present

## 2019-09-08 DIAGNOSIS — Z923 Personal history of irradiation: Secondary | ICD-10-CM | POA: Diagnosis not present

## 2019-09-08 DIAGNOSIS — Z9221 Personal history of antineoplastic chemotherapy: Secondary | ICD-10-CM | POA: Diagnosis not present

## 2019-09-08 NOTE — Progress Notes (Signed)
Radiation Oncology Follow up Note  Name: [name]   Date:   09/08/2019 MRN:  [id number] DOB: [birth year]    This 69 y.o. male presents to the clinic today for 1 month follow-up status post concurrent chemoradiation for HPV positive nasopharyngeal carcinoma..  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  He is not yet been back to see Dr. Lula Olszewski and I have requested he make an appointment there.  He otherwise is without complaint starting to have return of his taste.  He has been scheduled for repeat PET CT scan May 11  COMPLICATIONS OF TREATMENT: none  FOLLOW UP COMPLIANCE: keeps appointments   PHYSICAL EXAM:  BP 123/78 (BP Location: Left Arm, Patient Position: Sitting, Cuff Size: Normal)   Pulse 100   Temp (!) 97.4 F (36.3 C) (Tympanic)   Wt 195 lb (88.5 kg)   BMI 28.80 kg/m  Neck is clear without evidence of cervical or supraclavicular adenopathy.  Well-developed well-nourished patient in NAD. HEENT reveals PERLA, EOMI, discs not visualized.  Oral cavity is clear. No oral mucosal lesions are identified. Neck is clear without evidence of cervical or supraclavicular adenopathy. Lungs are clear to A&P. Cardiac examination is essentially unremarkable with regular rate and rhythm without murmur rub or thrill. Abdomen is benign with no organomegaly or masses noted. Motor sensory and DTR levels are equal and symmetric in the upper and lower extremities. Cranial nerves II through XII are grossly intact. Proprioception is intact. No peripheral adenopathy or edema is identified. No motor or sensory levels are noted. Crude visual fields are within normal range.  RADIOLOGY RESULTS: PET CT scan  will be reviewed when available  PLAN: Present time patient is recovering well from his concurrent chemoradiation.  I will review his PET CT scan when it becomes available.  I have also asked him to make a follow-up appointment with ENT.  I have asked to see him back in 3 to 4 months for follow-up.  Patient is to call with any concerns.  I would like to take this opportunity to thank you for allowing me to participate in the care of your patient.Noreene Filbert, MD

## 2019-09-11 DIAGNOSIS — F321 Major depressive disorder, single episode, moderate: Secondary | ICD-10-CM | POA: Insufficient documentation

## 2019-09-11 DIAGNOSIS — F339 Major depressive disorder, recurrent, unspecified: Secondary | ICD-10-CM | POA: Insufficient documentation

## 2019-09-16 ENCOUNTER — Telehealth: Payer: Self-pay

## 2019-09-16 NOTE — Telephone Encounter (Signed)
Nutrition Follow-up:  Patient with nasopharyngeal carcinoma.  Patient has completed concurrent chemotherapy and radiation therapy.    Spoke with patient via phone for follow-up.  Patient reports that he has this "scum" in his mouth.  Denies white patches.  Has been using biotene rinse.  Reports appetite is fair.  Has been eating soft foods.  Has not been drinking shakes as too sweet and got sick when he tried to drink one during treatment.  Reports some pain on swallowing but improving.    Medications: remeron added   Labs: reviewed  Anthropometrics:   Weight 195 lb decreased from 216 lb 1 oz on 3/8.  UBW 215-220 in June 2020.  Reports weight gain during pandemic.  States he feels better with weight loss.   NUTRITION DIAGNOSIS: Predicted suboptimal energy intake stable   INTERVENTION:  Provided recipe for baking/salt water rinses. Encouraged high protein foods. Patient has contact information and will contact RD if needed in the future      NEXT VISIT: no follow-up Patient to contact RD as needed  Monia Timmers B. Zenia Resides, [place], [place] Registered Dietitian [phone] (pager)

## 2019-09-20 ENCOUNTER — Telehealth: Payer: Self-pay

## 2019-09-21 ENCOUNTER — Other Ambulatory Visit: Payer: Self-pay

## 2019-09-21 ENCOUNTER — Encounter
Admission: RE | Admit: 2019-09-21 | Discharge: 2019-09-21 | Disposition: A | Discharge status: Home / Self Care | Payer: Medicare HMO | Source: Ambulatory Visit | Attending: Oncology | Admitting: Oncology

## 2019-09-21 DIAGNOSIS — R918 Other nonspecific abnormal finding of lung field: Secondary | ICD-10-CM | POA: Diagnosis not present

## 2019-09-21 DIAGNOSIS — I700 Atherosclerosis of aorta: Secondary | ICD-10-CM | POA: Insufficient documentation

## 2019-09-21 DIAGNOSIS — J439 Emphysema, unspecified: Secondary | ICD-10-CM | POA: Diagnosis not present

## 2019-09-21 DIAGNOSIS — C119 Malignant neoplasm of nasopharynx, unspecified: Secondary | ICD-10-CM | POA: Diagnosis not present

## 2019-09-21 DIAGNOSIS — R932 Abnormal findings on diagnostic imaging of liver and biliary tract: Secondary | ICD-10-CM | POA: Insufficient documentation

## 2019-09-21 LAB — GLUCOSE, CAPILLARY: Glucose-Capillary: 137 mg/dL — ABNORMAL HIGH (ref 70–99)

## 2019-09-21 MED ORDER — FLUDEOXYGLUCOSE F - 18 (FDG) INJECTION
10.1000 | Freq: Once | INTRAVENOUS | Status: AC | PRN
  Administered 2019-09-21: 10.73 via INTRAVENOUS

## 2019-09-24 ENCOUNTER — Inpatient Hospital Stay: Payer: Medicare HMO | Attending: Oncology

## 2019-09-24 ENCOUNTER — Other Ambulatory Visit: Payer: Self-pay

## 2019-09-24 ENCOUNTER — Inpatient Hospital Stay (HOSPITAL_BASED_OUTPATIENT_CLINIC_OR_DEPARTMENT_OTHER): Payer: Medicare HMO | Admitting: Oncology

## 2019-09-24 ENCOUNTER — Encounter: Payer: Self-pay | Admitting: Oncology

## 2019-09-24 VITALS — BP 113/72 | HR 78 | Temp 95.6°F | Resp 16 | Wt 186.1 lb

## 2019-09-24 DIAGNOSIS — R682 Dry mouth, unspecified: Secondary | ICD-10-CM | POA: Insufficient documentation

## 2019-09-24 DIAGNOSIS — Z9221 Personal history of antineoplastic chemotherapy: Secondary | ICD-10-CM | POA: Insufficient documentation

## 2019-09-24 DIAGNOSIS — Z87891 Personal history of nicotine dependence: Secondary | ICD-10-CM | POA: Insufficient documentation

## 2019-09-24 DIAGNOSIS — C119 Malignant neoplasm of nasopharynx, unspecified: Secondary | ICD-10-CM | POA: Insufficient documentation

## 2019-09-24 DIAGNOSIS — R630 Anorexia: Secondary | ICD-10-CM | POA: Diagnosis not present

## 2019-09-24 DIAGNOSIS — N179 Acute kidney failure, unspecified: Secondary | ICD-10-CM | POA: Insufficient documentation

## 2019-09-24 DIAGNOSIS — R634 Abnormal weight loss: Secondary | ICD-10-CM | POA: Diagnosis not present

## 2019-09-24 DIAGNOSIS — Z923 Personal history of irradiation: Secondary | ICD-10-CM | POA: Insufficient documentation

## 2019-09-24 DIAGNOSIS — Z85818 Personal history of malignant neoplasm of other sites of lip, oral cavity, and pharynx: Secondary | ICD-10-CM | POA: Insufficient documentation

## 2019-09-24 LAB — CBC WITH DIFFERENTIAL/PLATELET
Abs Immature Granulocytes: 0.06 10*3/uL (ref 0.00–0.07)
Basophils Absolute: 0 10*3/uL (ref 0.0–0.1)
Basophils Relative: 0 %
Eosinophils Absolute: 0 10*3/uL (ref 0.0–0.5)
Eosinophils Relative: 0 %
HCT: 42.1 % (ref 39.0–52.0)
Hemoglobin: 14.2 g/dL (ref 13.0–17.0)
Immature Granulocytes: 1 %
Lymphocytes Relative: 3 %
Lymphs Abs: 0.4 10*3/uL — ABNORMAL LOW (ref 0.7–4.0)
MCH: 32.3 pg (ref 26.0–34.0)
MCHC: 33.7 g/dL (ref 30.0–36.0)
MCV: 95.7 fL (ref 80.0–100.0)
Monocytes Absolute: 0.5 10*3/uL (ref 0.1–1.0)
Monocytes Relative: 4 %
Neutro Abs: 10.2 10*3/uL — ABNORMAL HIGH (ref 1.7–7.7)
Neutrophils Relative %: 92 %
Platelets: 175 10*3/uL (ref 150–400)
RBC: 4.4 MIL/uL (ref 4.22–5.81)
RDW: 15.6 % — ABNORMAL HIGH (ref 11.5–15.5)
WBC: 11.1 10*3/uL — ABNORMAL HIGH (ref 4.0–10.5)
nRBC: 0 % (ref 0.0–0.2)

## 2019-09-24 LAB — COMPREHENSIVE METABOLIC PANEL
ALT: 16 U/L (ref 0–44)
AST: 24 U/L (ref 15–41)
Albumin: 4.1 g/dL (ref 3.5–5.0)
Alkaline Phosphatase: 50 U/L (ref 38–126)
Anion gap: 11 (ref 5–15)
BUN: 19 mg/dL (ref 8–23)
CO2: 30 mmol/L (ref 22–32)
Calcium: 9.1 mg/dL (ref 8.9–10.3)
Chloride: 96 mmol/L — ABNORMAL LOW (ref 98–111)
Creatinine, Ser: 1.34 mg/dL — ABNORMAL HIGH (ref 0.61–1.24)
GFR calc Af Amer: >60 mL/min (ref >60)
GFR calc non Af Amer: 55 mL/min — ABNORMAL LOW (ref >60)
Glucose, Bld: 170 mg/dL — ABNORMAL HIGH (ref 70–99)
Potassium: 4.5 mmol/L (ref 3.5–5.1)
Sodium: 137 mmol/L (ref 135–145)
Total Bilirubin: 1 mg/dL (ref 0.3–1.2)
Total Protein: 6.9 g/dL (ref 6.5–8.1)

## 2019-09-24 LAB — MAGNESIUM: Magnesium: 1.8 mg/dL (ref 1.7–2.4)

## 2019-09-24 MED ORDER — PROCHLORPERAZINE MALEATE 10 MG PO TABS: ORAL_TABLET | ORAL | 1 refills | Status: DC

## 2019-09-24 NOTE — Progress Notes (Signed)
Patient here for oncology follow-up appointment, expresses no complaints or concerns at this time.    

## 2019-09-27 MED ORDER — MEGESTROL ACETATE 625 MG/5ML PO SUSP
625.0000 mg | Freq: Every day | ORAL | 0 refills | Status: DC
Start: 2019-09-27 — End: 2019-12-10

## 2019-09-27 NOTE — Progress Notes (Signed)
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 Pryor Ochoa for symptoms of ear pain and discharge. Patient also noticed associated hearing loss and tinnitus. Patient was noted to have erythema and edema in the left sphenopalatine fossa as well as an exophytic mass on NPL exam. CT soft tissue of the neck showed asymmetric soft tissue within the left nasopharynx measuring 2.4 x 2 x 3.2 cm. There is lateral bulging with encroachment upon the left parapharyngeal space without frank infiltration. No extension into oropharynx or nasal cavity. No skull base infiltration. No appreciable mass within the larynx. No pathologically enlarged cervical lymph nodes. 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 He was only able to receive 6 doses of weekly cisplatin 40 mg due to cytopenias and AKI  Interval history-patient reports dry mouth andsensation which makes it difficult for him to swallow.  He is continuing to take OxyContin and oxycodone.He continues to lose weight and is down to 186 pounds from 195 pounds 3 weeks ago.  Attributes weight loss  mainly due to lack of appetite and desire to eat  ECOG PS- 1 Pain scale- 1 Opioid associated constipation- no  Review of systems- Review of Systems  Constitutional: Positive for malaise/fatigue. Negative for chills, fever and weight loss.  HENT: Negative for congestion, ear discharge and nosebleeds.        Dry mouth and difficulty swallowing  Eyes: Negative for blurred vision.  Respiratory: Negative for cough, hemoptysis, sputum production, shortness of breath and wheezing.   Cardiovascular: Negative for chest pain, palpitations, orthopnea and claudication.  Gastrointestinal: Negative for abdominal pain, blood in stool, constipation, diarrhea, heartburn, melena, nausea and vomiting.  Genitourinary: Negative for dysuria, flank pain, frequency, hematuria and urgency.  Musculoskeletal: Negative for back pain, joint pain and myalgias.  Skin: Negative for rash.  Neurological: Negative for dizziness, tingling, focal weakness, seizures, weakness and headaches.  Endo/Heme/Allergies: Does not bruise/bleed easily.  Psychiatric/Behavioral: Negative for depression and suicidal ideas. The patient does not have insomnia.       Allergies  Allergen Reactions  . Opana [Oxymorphone Hcl]     Made him BLACKOUT  . Amoxicillin Other (See Comments)  . Oxymorphone Other (See Comments)  . Sulfa Antibiotics Other (See Comments)  . Sulfur     Childhood reaction      Past Medical History:  Diagnosis Date  . Alcohol abuse   . Benzodiazepine dependence ([place])   . Benzodiazepine withdrawal ([place])   . Chronic pain in right foot   . COPD (chronic obstructive pulmonary disease) ([place])   . Depression 08/19/2017  . Hepatitis C   . Hypertension   . 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[place])   . Seizures ([place]) 2010  . Sleep apnea   .  Syncope    questionable vasovagal     Past Surgical History:  Procedure Laterality Date  . COLONOSCOPY WITH PROPOFOL N/A 12/16/2016   Procedure: COLONOSCOPY WITH PROPOFOL;  Surgeon:  Lollie Sails, MD;  Location: [facility] ENDOSCOPY;  Service: Endoscopy;  Laterality: N/A;  . COLONOSCOPY WITH PROPOFOL N/A 03/14/2017   Procedure: COLONOSCOPY WITH PROPOFOL;  Surgeon: Lollie Sails, MD;  Location: [facility] ENDOSCOPY;  Service: Endoscopy;  Laterality: N/A;  . FOOT SURGERY Right 03/12/2002  . HEMORRHOID SURGERY    . LITHOTRIPSY    . MYRINGOTOMY WITH TUBE PLACEMENT Left 04/28/2019   Procedure: MYRINGOTOMY WITH TUBE PLACEMENT;  Surgeon: Carloyn Manner, MD;  Location: [place];  Service: ENT;  Laterality: Left;  . NASOPHARYNGOSCOPY N/A 04/28/2019   Procedure: NASOPHARYNGOSCOPY WITH BIOPSY;  Surgeon: Carloyn Manner, MD;  Location: [place];  Service: ENT;  Laterality: N/A;  . PORTA CATH INSERTION N/A 05/20/2019   Procedure: PORTA CATH INSERTION;  Surgeon: Algernon Huxley, MD;  Location: [place] CV LAB;  Service: Cardiovascular;  Laterality: N/A;  . TONSILLECTOMY      Social History   Socioeconomic History  . Marital status: Divorced    Spouse name: Not on file  . Number of children: Not on file  . Years of education: Not on file  . Highest education level: Not on file  Occupational History  . Not on file  Tobacco Use  . Smoking status: Former Smoker    Packs/day: 0.50    Years: 50.00    Pack years: 25.00    Types: Cigarettes    Quit date: 02/05/2019    Years since quitting: 0.6  . Smokeless tobacco: Never Used  Substance and Sexual Activity  . Alcohol use: Not Currently    Alcohol/week: 7.0 standard drinks    Types: 7 Standard drinks or equivalent per week  . Drug use: Not Currently  . Sexual activity: Not Currently  Other Topics Concern  . Not on file  Social History Narrative  . Not on file   Social Determinants of Health   Financial Resource Strain:   . Difficulty of Paying Living Expenses:   Food Insecurity: No Food Insecurity  . Worried About Charity fundraiser in the Last Year: Never true  . Ran Out of Food in the Last  Year: Never true  Transportation Needs: No Transportation Needs  . Lack of Transportation (Medical): No  . Lack of Transportation (Non-Medical): No  Physical Activity: Inactive  . Days of Exercise per Week: 0 days  . Minutes of Exercise per Session: 0 min  Stress:   . Feeling of Stress :   Social Connections:   . Frequency of Communication with Friends and Family:   . Frequency of Social Gatherings with Friends and Family:   . Attends Religious Services:   . Active Member of Clubs or Organizations:   . Attends Archivist Meetings:   Marland Kitchen Marital Status:   Intimate Partner Violence: Not At Risk  . Fear of Current or Ex-Partner: No  . Emotionally Abused: No  . Physically Abused: No  . Sexually Abused: No    Family History  Problem Relation Age of Onset  . Arthritis Mother   . Hypertension Mother   . Cancer Father   . Kidney disease Father   . Alcohol abuse Paternal Aunt   . Depression Maternal Grandfather      Current Outpatient Medications:  .  albuterol (VENTOLIN HFA) 108 (90 Base) MCG/ACT inhaler,  Inhale 2 puffs into the lungs every 6 (six) hours as needed for wheezing or shortness of breath., Disp: 18 g, Rfl: 3 .  ALPRAZolam (XANAX) 0.5 MG tablet, Take 1 tablet (0.5 mg total) by mouth every 6 (six) hours as needed for anxiety., Disp: 60 tablet, Rfl: 0 .  amLODipine (NORVASC) 5 MG tablet, Take 1 tablet (5 mg total) by mouth daily., Disp: 90 tablet, Rfl: 3 .  antiseptic oral rinse (BIOTENE) LIQD, 15 mLs by Mouth Rinse route as needed for dry mouth., Disp: , Rfl:  .  aspirin EC 81 MG tablet, Take 81 mg by mouth daily., Disp: , Rfl:  .  gabapentin (NEURONTIN) 100 MG capsule, Take 2 capsules (200 mg total) by mouth 3 (three) times daily., Disp: 180 capsule, Rfl: 3 .  Magnesium 500 MG CAPS, Take 1 capsule by mouth in the morning and at bedtime., Disp: , Rfl:  .  MAGNESIUM PO, Take by mouth., Disp: , Rfl:  .  Multiple Vitamin (MULTIVITAMIN WITH MINERALS) TABS tablet, Take  1 tablet by mouth daily., Disp: , Rfl:  .  ondansetron (ZOFRAN) 4 MG tablet, Take 1 tablet (4 mg total) by mouth every 8 (eight) hours as needed for up to 10 doses for nausea or vomiting., Disp: 20 tablet, Rfl: 0 .  oxyCODONE (OXYCONTIN) 20 mg 12 hr tablet, Take 1 tablet (20 mg total) by mouth every 12 (twelve) hours., Disp: 60 tablet, Rfl: 0 .  cyclobenzaprine (FLEXERIL) 5 MG tablet, Take 2 tablets (10 mg total) by mouth 2 (two) times daily as needed for muscle spasms. (Patient not taking: Reported on 09/24/2019), Disp: 45 tablet, Rfl: 1 .  hydrOXYzine (ATARAX/VISTARIL) 25 MG tablet, Take 1 to 2 tablets po QHS prn insomnia/anxiety (Patient not taking: Reported on 09/24/2019), Disp: 60 tablet, Rfl: 2 .  mirtazapine (REMERON) 7.5 MG tablet, Take 1 tablet (7.5 mg total) by mouth at bedtime. (Patient not taking: Reported on 09/24/2019), Disp: 30 tablet, Rfl: 3 .  Oxycodone HCl 10 MG TABS, Take 1 tablet (10 mg total) by mouth every 4 (four) hours as needed. (Patient not taking: Reported on 09/24/2019), Disp: 120 tablet, Rfl: 0 .  prochlorperazine (COMPAZINE) 10 MG tablet, TAKE 1 TABLET BY MOUTH EVERY 6 HOURS AS NEEDED FOR NAUSEA OR VOMITING, Disp: 30 tablet, Rfl: 1 No current facility-administered medications for this visit.  Facility-Administered Medications Ordered in Other Visits:  .  heparin lock flush 100 unit/mL, 500 Units, Intravenous, Once, Randa Evens C, MD .  sodium chloride flush (NS) 0.9 % injection 10 mL, 10 mL, Intravenous, PRN, Sindy Guadeloupe, MD, 10 mL at 08/17/19 0841  Physical exam:  Vitals:   09/24/19 0956  BP: 113/72  Pulse: 78  Resp: 16  Temp: (!) 95.6 F (35.3 C)  TempSrc: Tympanic  SpO2: 98%  Weight: 186 lb 1.6 oz (84.4 kg)   Physical Exam Constitutional:      General: He is not in acute distress. HENT:     Mouth/Throat:     Mouth: Mucous membranes are moist.     Pharynx: Oropharynx is clear.  Cardiovascular:     Rate and Rhythm: Normal rate and regular rhythm.      Heart sounds: Normal heart sounds.  Pulmonary:     Effort: Pulmonary effort is normal.     Breath sounds: Normal breath sounds.  Abdominal:     General: Bowel sounds are normal.     Palpations: Abdomen is soft.  Skin:    General: Skin is  warm and dry.  Neurological:     Mental Status: He is alert and oriented to person, place, and time.      CMP Latest Ref Rng & Units 09/24/2019  Glucose 70 - 99 mg/dL 170(H)  BUN 8 - 23 mg/dL 19  Creatinine 0.61 - 1.24 mg/dL 1.34(H)  Sodium 135 - 145 mmol/L 137  Potassium 3.5 - 5.1 mmol/L 4.5  Chloride 98 - 111 mmol/L 96(L)  CO2 22 - 32 mmol/L 30  Calcium 8.9 - 10.3 mg/dL 9.1  Total Protein 6.5 - 8.1 g/dL 6.9  Total Bilirubin 0.3 - 1.2 mg/dL 1.0  Alkaline Phos 38 - 126 U/L 50  AST 15 - 41 U/L 24  ALT 0 - 44 U/L 16   CBC Latest Ref Rng & Units 09/24/2019  WBC 4.0 - 10.5 K/uL 11.1(H)  Hemoglobin 13.0 - 17.0 g/dL 14.2  Hematocrit 39.0 - 52.0 % 42.1  Platelets 150 - 400 K/uL 175    No images are attached to the encounter.  NM PET Image Restag (PS) Skull Base To Thigh  Result Date: 09/22/2019 CLINICAL DATA:  Subsequent treatment strategy for nasopharyngeal carcinoma. EXAM: NUCLEAR MEDICINE PET SKULL BASE TO THIGH TECHNIQUE: 10.73 mCi F-18 FDG was injected intravenously. Full-ring PET imaging was performed from the skull base to thigh after the radiotracer. CT data was obtained and used for attenuation correction and anatomic localization. Fasting blood glucose: 137 mg/dl COMPARISON:  Multiple prior studies, most recent 05/25/2019 FINDINGS: Mediastinal blood pool activity: SUV max 3.29 Liver activity: SUV max NA NECK: Nasopharyngeal activity and soft tissue with resolution since the prior exam. (SUVmax = 2.64) SUV measurement obtained in the area of previous tumor. No asymmetry which is measurable is present on today's scan in terms of size. No hypermetabolic lymph nodes in the neck Incidental CT findings: None CHEST: Very subtle ground-glass  attenuation in the medial RIGHT middle lobe corresponding to mild increased FDG uptake (SUVmax = 3.1) area of ground-glass measuring approximately 2.1 x 1.9 cm (image 117, series 3) subtle ground-glass present in this location on the previous exam no distinct solid component at this time. No areas of hypermetabolism above blood pool at this time. Very mild centrilobular emphysema as before. Incidental CT findings: RIGHT sided Port-A-Cath similar to prior study. Calcified atheromatous plaque in the thoracic aorta without aneurysm. ABDOMEN/PELVIS: No abnormal hypermetabolic activity within the liver, pancreas, adrenal glands, or spleen. No hypermetabolic lymph nodes in the abdomen or pelvis. Incidental CT findings: Cholelithiasis. No acute gastrointestinal process. Signs of vascular disease with calcification of the abdominal aorta without dilation. Lobular hepatic contours as before. No overt signs of portal hypertension. SKELETON: No focal hypermetabolic activity to suggest skeletal metastasis. Incidental CT findings: None IMPRESSION: 1. Resolution of activity and soft tissue in the LEFT nasopharynx. No signs of metastatic disease. 2. Mild hypermetabolism associated with ground-glass attenuation in the RIGHT middle lobe near the hilum. The subtle area of ground-glass on the prior study slightly more conspicuous on the current exam. Correlate with any interval history of pneumonia or respiratory symptoms. Would suggest 3 month follow-up with dedicated chest CT to further assess this finding, potentially related to prior infection or inflammation. Activity remains below blood pool. 3. Lobular hepatic contours raising the question of liver disease, clinical correlation suggested. 4. Aortic Atherosclerosis (ICD10-I70.0) and Emphysema (ICD10-J43.9). Electronically Signed   By: Zetta Bills M.D.   On: 09/22/2019 08:23     Assessment and plan- Patient is a 69 y.o. male with history of  nasopharyngeal carcinoma stage  II T2 N0 M0. He is s/p concurrent chemoradiation with weekly cisplatin chemotherapy.  He is here to discuss PET CT scan results and further management  I have reviewed PET/CT scan images independently and discussed findings with the patient.  Currently patient does not have any hypermetabolism indicating recurrent or residual disease.  He was noted to have a slightly groundglass attenuation in the right middle lobe 2.1 x 1.9 cm which I will follow up in 3 months.    Per NCCN guidelines for T2 N0 M0 nasopharyngeal carcinoma induction or adjuvant chemotherapy is a consideration.  Adjuvant chemotherapy usually involves cisplatin plus infusional 5-FU chemotherapy every 4 weeks for 3 cycles.  There have been a couple of meta-analysis in this situation however whether adjuvant chemotherapy truly adds to any progression free survival or not is questionable.  Patients with stage II disease were underrepresented in these trials.  Patient continues to lose weight and has not quite recovered from chemotherapy and radiation yet.  He is about 10 pounds lower today as compared to 3 weeks ago.  I will therefore hold off on giving him any adjuvant chemotherapy at this time.   AKI: Patient's creatinine is mildly elevated at 1.3 but patient does not desire any IV fluids at this time.  He will continue to improve his oral intake at home.  Again I have encouraged him to continue taking protein shakes.  I will see him back in 1 month's time with CBC with differential and CMP  Abnormal weight loss: I have prescribed Megace for appetite stimulation   Visit Diagnosis 1. Abnormal weight loss   2. Nasopharyngeal carcinoma ([place])   3. AKI (acute kidney injury) ([place])      Dr. Randa Evens, MD, MPH [facility] [id number] 09/27/2019 8:58 AM

## 2019-10-01 ENCOUNTER — Other Ambulatory Visit: Payer: Self-pay

## 2019-10-07 ENCOUNTER — Telehealth: Payer: Self-pay | Admitting: *Deleted

## 2019-10-07 NOTE — Telephone Encounter (Signed)
Spoke to patient via telephone to further discuss symptoms. Patient reports a burning sensation in his upper chest and throat, which becomes much worse at night when he lays down, in addition to the painful swallowing. He stated that he mentioned this to Dr. Janese Banks at his last appointment. He is not currently taking any medication for acid reflux or heartburn, and states that the only medication he takes, is for his blood pressure and neuropathy.  He also states that he has no energy and feels fatigued most of the time. He has been trying to gain weight by eating ice cream and milkshakes, but he is having difficulty. He denies having any shortness of breath or dizziness. He stated that he drinks a lot of water daily.  Told patient that I would relay this information to Dr. Janese Banks, and that either I or Judeen Hammans, Dr. Elroy Channel RN, would call him back in the morning to let him know what she said. We are happy to see him in the symptom management clinic tomorrow morning if need be.

## 2019-10-07 NOTE — Telephone Encounter (Signed)
Patient called reporting that he is having pain in his upper chest and throat and difficulty swallowing and is asking if he can be seen tomorrow. Please advise

## 2019-10-08 ENCOUNTER — Other Ambulatory Visit: Payer: Self-pay | Admitting: *Deleted

## 2019-10-08 MED ORDER — OXYCODONE HCL 10 MG PO TABS: 10.0000 mg | ORAL_TABLET | ORAL | 0 refills | Status: DC | PRN

## 2019-10-08 MED ORDER — OXYCODONE HCL ER 20 MG PO T12A: 20.0000 mg | EXTENDED_RELEASE_TABLET | Freq: Two times a day (BID) | ORAL | 0 refills | Status: DC

## 2019-10-08 MED ORDER — PANTOPRAZOLE SODIUM 40 MG PO TBEC
40.0000 mg | DELAYED_RELEASE_TABLET | Freq: Every day | ORAL | 2 refills | Status: DC
Start: 2019-10-08 — End: 2019-12-10

## 2019-10-08 NOTE — Telephone Encounter (Signed)
I called patient and spoke to him.- I asked about the sx that he spoke to [facility] about and it is still happening. He was not using biotene but will be getting more today. He does not have any pain meds at all. I told him that Dr. Janese Banks states that we will give him protonix and sent to pharmacy. Then she has spoke to Dr. Pryor Ochoa and he will have his office call and make appt for pt. Then she will refill pain meds.  After trying theses things above and it does not get better then call our office. Pt. Is agreeable to the plan

## 2019-10-08 NOTE — Telephone Encounter (Signed)
Judeen Hammans will reach out to him. We will send him a prescription for pantoprazole and adjust his pain meds. I have also asked ENT office Dr. Pryor Ochoa to see him

## 2019-10-15 DIAGNOSIS — K219 Gastro-esophageal reflux disease without esophagitis: Secondary | ICD-10-CM | POA: Diagnosis not present

## 2019-10-15 DIAGNOSIS — Z8522 Personal history of malignant neoplasm of nasal cavities, middle ear, and accessory sinuses: Secondary | ICD-10-CM | POA: Diagnosis not present

## 2019-10-19 ENCOUNTER — Other Ambulatory Visit: Payer: Self-pay

## 2019-10-19 DIAGNOSIS — M5441 Lumbago with sciatica, right side: Secondary | ICD-10-CM

## 2019-10-19 DIAGNOSIS — M5442 Lumbago with sciatica, left side: Secondary | ICD-10-CM

## 2019-10-19 MED ORDER — CYCLOBENZAPRINE HCL 5 MG PO TABS: 10.0000 mg | ORAL_TABLET | Freq: Two times a day (BID) | ORAL | 0 refills | Status: DC | PRN

## 2019-10-26 ENCOUNTER — Telehealth: Payer: Self-pay | Admitting: Oncology

## 2019-10-26 ENCOUNTER — Inpatient Hospital Stay: Payer: Medicare HMO

## 2019-10-26 ENCOUNTER — Inpatient Hospital Stay: Payer: Medicare HMO | Admitting: Oncology

## 2019-10-26 NOTE — Telephone Encounter (Signed)
Patient missed appt on this date. Writer phoned patient to reschedule and patient stated that his mother was recently put in the hospital and that he has been busy helping with this. Appts rescheduled for 11-02-19.

## 2019-11-02 ENCOUNTER — Inpatient Hospital Stay: Payer: Medicare HMO | Attending: Oncology

## 2019-11-02 ENCOUNTER — Inpatient Hospital Stay: Payer: Medicare HMO | Admitting: Oncology

## 2019-11-03 ENCOUNTER — Emergency Department: Payer: Medicare HMO

## 2019-11-03 ENCOUNTER — Other Ambulatory Visit: Payer: Self-pay

## 2019-11-03 ENCOUNTER — Inpatient Hospital Stay: Payer: Medicare HMO

## 2019-11-03 ENCOUNTER — Inpatient Hospital Stay
Admission: EM | Admit: 2019-11-03 | Discharge: 2019-11-05 | DRG: 092 | Disposition: A | Discharge status: Home / Self Care | Payer: Medicare HMO | Attending: Internal Medicine | Admitting: Internal Medicine

## 2019-11-03 DIAGNOSIS — Z885 Allergy status to narcotic agent status: Secondary | ICD-10-CM | POA: Diagnosis not present

## 2019-11-03 DIAGNOSIS — R4182 Altered mental status, unspecified: Secondary | ICD-10-CM

## 2019-11-03 DIAGNOSIS — Z85818 Personal history of malignant neoplasm of other sites of lip, oral cavity, and pharynx: Secondary | ICD-10-CM

## 2019-11-03 DIAGNOSIS — Z9221 Personal history of antineoplastic chemotherapy: Secondary | ICD-10-CM

## 2019-11-03 DIAGNOSIS — K56609 Unspecified intestinal obstruction, unspecified as to partial versus complete obstruction: Secondary | ICD-10-CM | POA: Diagnosis not present

## 2019-11-03 DIAGNOSIS — Z880 Allergy status to penicillin: Secondary | ICD-10-CM

## 2019-11-03 DIAGNOSIS — Z03818 Encounter for observation for suspected exposure to other biological agents ruled out: Secondary | ICD-10-CM | POA: Diagnosis not present

## 2019-11-03 DIAGNOSIS — G4730 Sleep apnea, unspecified: Secondary | ICD-10-CM | POA: Diagnosis not present

## 2019-11-03 DIAGNOSIS — R000 Tachycardia, unspecified: Secondary | ICD-10-CM | POA: Diagnosis not present

## 2019-11-03 DIAGNOSIS — G40909 Epilepsy, unspecified, not intractable, without status epilepticus: Secondary | ICD-10-CM | POA: Diagnosis not present

## 2019-11-03 DIAGNOSIS — Z1389 Encounter for screening for other disorder: Secondary | ICD-10-CM

## 2019-11-03 DIAGNOSIS — T426X5A Adverse effect of other antiepileptic and sedative-hypnotic drugs, initial encounter: Secondary | ICD-10-CM | POA: Diagnosis not present

## 2019-11-03 DIAGNOSIS — Z20822 Contact with and (suspected) exposure to covid-19: Secondary | ICD-10-CM | POA: Diagnosis not present

## 2019-11-03 DIAGNOSIS — Z87891 Personal history of nicotine dependence: Secondary | ICD-10-CM | POA: Diagnosis not present

## 2019-11-03 DIAGNOSIS — G9340 Encephalopathy, unspecified: Secondary | ICD-10-CM

## 2019-11-03 DIAGNOSIS — I10 Essential (primary) hypertension: Secondary | ICD-10-CM | POA: Diagnosis not present

## 2019-11-03 DIAGNOSIS — Z923 Personal history of irradiation: Secondary | ICD-10-CM

## 2019-11-03 DIAGNOSIS — Z0189 Encounter for other specified special examinations: Secondary | ICD-10-CM

## 2019-11-03 DIAGNOSIS — N179 Acute kidney failure, unspecified: Secondary | ICD-10-CM | POA: Diagnosis not present

## 2019-11-03 DIAGNOSIS — Z882 Allergy status to sulfonamides status: Secondary | ICD-10-CM

## 2019-11-03 DIAGNOSIS — G92 Toxic encephalopathy: Principal | ICD-10-CM | POA: Diagnosis present

## 2019-11-03 DIAGNOSIS — J449 Chronic obstructive pulmonary disease, unspecified: Secondary | ICD-10-CM | POA: Diagnosis not present

## 2019-11-03 DIAGNOSIS — Z79899 Other long term (current) drug therapy: Secondary | ICD-10-CM

## 2019-11-03 LAB — TROPONIN I (HIGH SENSITIVITY)
Troponin I (High Sensitivity): 15 ng/L (ref <18)
Troponin I (High Sensitivity): 15 ng/L (ref <18)

## 2019-11-03 LAB — URINALYSIS, COMPLETE (UACMP) WITH MICROSCOPIC
Bacteria, UA: NONE SEEN
Bilirubin Urine: NEGATIVE
Glucose, UA: NEGATIVE mg/dL
Hgb urine dipstick: NEGATIVE
Ketones, ur: 20 mg/dL — AB
Leukocytes,Ua: NEGATIVE
Nitrite: NEGATIVE
Protein, ur: NEGATIVE mg/dL
Specific Gravity, Urine: 1.01 (ref 1.005–1.030)
Squamous Epithelial / HPF: NONE SEEN (ref 0–5)
pH: 5 (ref 5.0–8.0)

## 2019-11-03 LAB — CBC WITH DIFFERENTIAL/PLATELET
Abs Immature Granulocytes: 0.01 10*3/uL (ref 0.00–0.07)
Basophils Absolute: 0 10*3/uL (ref 0.0–0.1)
Basophils Relative: 0 %
Eosinophils Absolute: 0 10*3/uL (ref 0.0–0.5)
Eosinophils Relative: 0 %
HCT: 39 % (ref 39.0–52.0)
Hemoglobin: 13.4 g/dL (ref 13.0–17.0)
Immature Granulocytes: 0 %
Lymphocytes Relative: 9 %
Lymphs Abs: 0.5 10*3/uL — ABNORMAL LOW (ref 0.7–4.0)
MCH: 31.7 pg (ref 26.0–34.0)
MCHC: 34.4 g/dL (ref 30.0–36.0)
MCV: 92.2 fL (ref 80.0–100.0)
Monocytes Absolute: 0.7 10*3/uL (ref 0.1–1.0)
Monocytes Relative: 12 %
Neutro Abs: 5.1 10*3/uL (ref 1.7–7.7)
Neutrophils Relative %: 79 %
Platelets: 177 10*3/uL (ref 150–400)
RBC: 4.23 MIL/uL (ref 4.22–5.81)
RDW: 14.2 % (ref 11.5–15.5)
WBC: 6.4 10*3/uL (ref 4.0–10.5)
nRBC: 0 % (ref 0.0–0.2)

## 2019-11-03 LAB — SALICYLATE LEVEL: Salicylate Lvl: <7 mg/dL — ABNORMAL LOW (ref 7.0–30.0)

## 2019-11-03 LAB — URINE DRUG SCREEN, QUALITATIVE (ARMC ONLY)
Amphetamines, Ur Screen: NOT DETECTED
Barbiturates, Ur Screen: NOT DETECTED
Benzodiazepine, Ur Scrn: POSITIVE — AB
Cannabinoid 50 Ng, Ur ~~LOC~~: NOT DETECTED
Cocaine Metabolite,Ur ~~LOC~~: NOT DETECTED
MDMA (Ecstasy)Ur Screen: NOT DETECTED
Methadone Scn, Ur: NOT DETECTED
Opiate, Ur Screen: NOT DETECTED
Phencyclidine (PCP) Ur S: NOT DETECTED
Tricyclic, Ur Screen: POSITIVE — AB

## 2019-11-03 LAB — COMPREHENSIVE METABOLIC PANEL
ALT: 15 U/L (ref 0–44)
AST: 44 U/L — ABNORMAL HIGH (ref 15–41)
Albumin: 4.1 g/dL (ref 3.5–5.0)
Alkaline Phosphatase: 49 U/L (ref 38–126)
Anion gap: 17 — ABNORMAL HIGH (ref 5–15)
BUN: 17 mg/dL (ref 8–23)
CO2: 20 mmol/L — ABNORMAL LOW (ref 22–32)
Calcium: 9.4 mg/dL (ref 8.9–10.3)
Chloride: 102 mmol/L (ref 98–111)
Creatinine, Ser: 1.63 mg/dL — ABNORMAL HIGH (ref 0.61–1.24)
GFR calc Af Amer: 50 mL/min — ABNORMAL LOW (ref >60)
GFR calc non Af Amer: 43 mL/min — ABNORMAL LOW (ref >60)
Glucose, Bld: 114 mg/dL — ABNORMAL HIGH (ref 70–99)
Potassium: 3.6 mmol/L (ref 3.5–5.1)
Sodium: 139 mmol/L (ref 135–145)
Total Bilirubin: 1.1 mg/dL (ref 0.3–1.2)
Total Protein: 7.4 g/dL (ref 6.5–8.1)

## 2019-11-03 LAB — HIV ANTIBODY (ROUTINE TESTING W REFLEX): HIV Screen 4th Generation wRfx: NONREACTIVE

## 2019-11-03 LAB — AMMONIA: Ammonia: 12 umol/L (ref 9–35)

## 2019-11-03 LAB — ETHANOL: Alcohol, Ethyl (B): <10 mg/dL (ref <10)

## 2019-11-03 LAB — ACETAMINOPHEN LEVEL: Acetaminophen (Tylenol), Serum: <10 ug/mL — ABNORMAL LOW (ref 10–30)

## 2019-11-03 LAB — SARS CORONAVIRUS 2 BY RT PCR (HOSPITAL ORDER, PERFORMED IN ~~LOC~~ HOSPITAL LAB): SARS Coronavirus 2: NEGATIVE

## 2019-11-03 MED ORDER — SODIUM CHLORIDE 0.9 % IV BOLUS
1000.0000 mL | Freq: Once | INTRAVENOUS | Status: AC
  Administered 2019-11-03: 1000 mL via INTRAVENOUS

## 2019-11-03 MED ORDER — ONDANSETRON HCL 4 MG PO TABS: 4.0000 mg | ORAL_TABLET | Freq: Four times a day (QID) | ORAL | Status: DC | PRN

## 2019-11-03 MED ORDER — ONDANSETRON HCL 4 MG/2ML IJ SOLN: 4.0000 mg | Freq: Four times a day (QID) | INTRAMUSCULAR | Status: DC | PRN

## 2019-11-03 MED ORDER — ASPIRIN 325 MG PO TABS
325.0000 mg | ORAL_TABLET | Freq: Every day | ORAL | Status: DC
  Filled 2019-11-03: qty 1

## 2019-11-03 MED ORDER — ASPIRIN 300 MG RE SUPP
300.0000 mg | Freq: Once | RECTAL | Status: AC
  Administered 2019-11-03: 300 mg via RECTAL
  Filled 2019-11-03: qty 1

## 2019-11-03 MED ORDER — SODIUM CHLORIDE 0.9 % IV SOLN: 250.0000 mL | INTRAVENOUS | Status: DC | PRN

## 2019-11-03 MED ORDER — SODIUM CHLORIDE 0.9% FLUSH: 3.0000 mL | INTRAVENOUS | Status: DC | PRN

## 2019-11-03 MED ORDER — LORAZEPAM 2 MG/ML IJ SOLN: 2.0000 mg | INTRAMUSCULAR | Status: DC | PRN

## 2019-11-03 MED ORDER — SODIUM CHLORIDE 0.9% FLUSH
3.0000 mL | Freq: Two times a day (BID) | INTRAVENOUS | Status: DC
  Administered 2019-11-04 – 2019-11-05 (×2): 3 mL via INTRAVENOUS

## 2019-11-03 NOTE — ED Notes (Signed)
Pt visualized in NAD at this, pt resting on R side in the bed. Tele-sitter remains at bedside at this time.

## 2019-11-03 NOTE — ED Notes (Signed)
This RN to bedside due to monitors alarming. Pt with short run of tachycardia, HR noted to be 150. Pt noted to be resting in bed with NAD noted upon this RN arrival to bedside. Repeat EKG obtained. HR immediately back to 86 with this RN at bedside. 2nd EKG obtained. EDP made aware, EKG placed in chart.

## 2019-11-03 NOTE — ED Provider Notes (Signed)
[facility] Emergency Department Provider Note  ____________________________________________   First MD Initiated Contact with Patient 11/03/19 0701     (approximate)  I have reviewed the triage vital signs and the nursing notes.   HISTORY  Chief Complaint Altered Mental Status    HPI [name] is a 69 y.o. male from [place] home where patient was found by independent living resident walking around outside with his shirt on but naked from waist down.  Patient was not sent with any paperwork and I have attempted to contact the independent living facility but they are not answering.  Therefore unable to get any medical history about patient.  Patient is oriented x1 denies any pain.  Unable to get full HPI due to patient's altered mental status.          Past Medical History:  Diagnosis Date  . Alcohol abuse   . Benzodiazepine dependence ([place])   . Benzodiazepine withdrawal ([place])   . Chronic pain in right foot   . COPD (chronic obstructive pulmonary disease) ([place])   . Depression   . Hepatitis C   . Hypertension   . 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 Seizures ([place])   . Sleep apnea   . Syncope     There are no problems to display for this patient.   History reviewed. No pertinent surgical history.  Prior to Admission medications   Not on File    Allergies Patient has no allergy information on record.  No family history on file.  Social History Social History   Tobacco Use  . Smoking status: Former Smoker  Substance Use Topics  . Alcohol use: Yes  . Drug use: Not Currently      Review of Systems Unable to get full review of systems due to altered mental status _______________________   PHYSICAL EXAM:  VITAL SIGNS: ED Triage Vitals  Enc Vitals Group     BP 11/03/19 0640 (!) 157/83     Pulse Rate 11/03/19 0640 86     Resp 11/03/19 0640 18     Temp 11/03/19 0640 97.9 F (36.6 C)     Temp Source 11/03/19 0640 Oral      SpO2 11/03/19 0640 100 %     Weight 11/03/19 0641 170 lb (77.1 kg)     Height 11/03/19 0641 5\' 9"  (1.753 m)     Head Circumference --      Peak Flow --      Pain Score --      Pain Loc --      Pain Edu? --      Excl. in [place]? --     Constitutional: Alert and oriented x1. Well appearing and in no acute distress. Eyes: Conjunctivae are normal. EOMI. Head: Atraumatic. Nose: No congestion/rhinnorhea. Mouth/Throat: Mucous membranes are moist.   Neck: No stridor. Trachea Midline. FROM Cardiovascular: Normal rate, regular rhythm. Grossly normal heart sounds.  Good peripheral circulation. Respiratory: Normal respiratory effort.  No retractions. Lungs CTAB. Gastrointestinal: Soft and nontender. No distention. No abdominal bruits.  Musculoskeletal: No lower extremity tenderness nor edema.  No joint effusions. Neurologic: Patient speech is slightly slurred and difficult to understand but he is only oriented x1.  He knows he is in [facility] but gives a different address and not able to state that he is in the hospital.  He thinks the year is 35s.  He is able to follow commands and lift up bilateral legs and squeeze bilateral hands.  There is no evidence of weakness on one side.  He is got no cranial nerve deficits other than the difficulty understanding him. Skin:  Skin is warm, dry and intact. No rash noted. Psychiatric: Unable to fully assess due to mental status GU: Deferred   ____________________________________________   LABS (all labs ordered are listed, but only abnormal results are displayed)  Labs Reviewed  CBC WITH DIFFERENTIAL/PLATELET - Abnormal; Notable for the following components:      Result Value   Lymphs Abs 0.5 (*)    All other components within normal limits  COMPREHENSIVE METABOLIC PANEL - Abnormal; Notable for the following components:   CO2 20 (*)    Glucose, Bld 114 (*)    Creatinine, Ser 1.63 (*)    AST 44 (*)    GFR calc non Af Amer 43 (*)     GFR calc Af Amer 50 (*)    Anion gap 17 (*)    All other components within normal limits  SALICYLATE LEVEL - Abnormal; Notable for the following components:   Salicylate Lvl <7.1 (*)    All other components within normal limits  ACETAMINOPHEN LEVEL - Abnormal; Notable for the following components:   Acetaminophen (Tylenol), Serum <10 (*)    All other components within normal limits  SARS CORONAVIRUS 2 BY RT PCR (HOSPITAL ORDER, [place] [facility])  AMMONIA  ETHANOL  URINE DRUG SCREEN, QUALITATIVE ([facility] ONLY)  URINALYSIS, COMPLETE (UACMP) WITH MICROSCOPIC  TROPONIN I (HIGH SENSITIVITY)  TROPONIN I (HIGH SENSITIVITY)   ____________________________________________   ED ECG REPORT I, Vanessa [place], the attending physician, personally viewed and interpreted this ECG.  EKG is sinus rate of 81, no ST elevation, T wave inversion in aVL, occasional PAC, left anterior fascicular block  Repeat EKG tachycardia narrow complex, no ST elevation, some T wave inversions V2, prolonged QTC.  Repeat EKG sinus rate of 80, no ST elevation, T wave inversion in V2, normal intervals ____________________________________________  RADIOLOGY Robert Bellow, personally viewed and evaluated these images (plain radiographs) as part of my medical decision making, as well as reviewing the written report by the radiologist.  ED MD interpretation:  No pna   Official radiology report(s): CT Head Wo Contrast  Result Date: 11/03/2019 CLINICAL DATA:  Altered mental status.  Confusion.  Headache. EXAM: CT HEAD WITHOUT CONTRAST TECHNIQUE: Contiguous axial images were obtained from the base of the skull through the vertex without intravenous contrast. COMPARISON:  None. FINDINGS: Brain: No evidence of acute infarction, hemorrhage, hydrocephalus, extra-axial collection or mass lesion/mass effect. Diffuse mild cerebral cortical and cerebellar atrophy. Vascular: No hyperdense vessel or unexpected  calcification. Skull: Normal. Negative for fracture or focal lesion. Sinuses/Orbits: Normal. Other: None IMPRESSION: No acute abnormality. Diffuse mild atrophy. Electronically Signed   By: Lorriane Shire M.D.   On: 11/03/2019 07:52   DG Chest Portable 1 View  Result Date: 11/03/2019 CLINICAL DATA:  Shortness of breath. Altered mental status. Slurred speech. EXAM: PORTABLE CHEST 1 VIEW COMPARISON:  None. FINDINGS: Power port in place in good position with the tip in the superior vena cava just below the carina. Heart size and pulmonary vascularity are normal. Lungs are clear. No bone abnormality. IMPRESSION: No acute cardiopulmonary disease. Electronically Signed   By: Lorriane Shire M.D.   On: 11/03/2019 07:51    ____________________________________________   PROCEDURES  Procedure(s) performed (including Critical Care):  Procedures   ____________________________________________   INITIAL IMPRESSION / ASSESSMENT AND PLAN / ED COURSE  [name] was evaluated in Emergency Department on 11/03/2019 for the symptoms described in the history of present illness. He was evaluated in the context of the global COVID-19 pandemic, which necessitated consideration that the patient might be at risk for infection with the SARS-CoV-2 virus that causes COVID-19. Institutional protocols and algorithms that pertain to the evaluation of patients at risk for COVID-19 are in a state of rapid change based on information released by regulatory bodies including the CDC and federal and state organizations. These policies and algorithms were followed during the patient's care in the ED.    Patient is a 69 year old who comes in with concerns for altered mental status after being found walking around outside without his pants on.  Unfortunately have no medical history and patient he came with no paperwork we are unable to get a hold of the facility to confirm patient's medical history or baseline mental status.  At this  time we will proceed with broad metabolic work-up, labs to evaluate for liver dysfunction, EtOH, infection such as UTI CT head evaluate for stroke given his slurred speech.  Labs are reassuring.  However patient went into sinus tachycardia with rate of 137.  Possibly in atrial flutter to the illness versus an SVT but then broke on its own back to normal sinus.  Kidney function slightly elevated at 1.63 with an anion gap of 17.  Will give 1 L of fluid.  CT head and x-ray are negative.  I did discuss with patient's sister who states that he does have a history of addiction problems not sure that could be happening.  However he was recently treated for cancer so there is potential something metastasizing to his brain.  Sound like patient is acting not his baseline self.  We will discuss hospital team for admission for altered mental status to rule out stroke, metastatic cancer of the brain and cardiac monitoring given the episode of tachycardia.  10:50 AM after admission patient did have a fall and was able to get out of bed.  Patient moving all extremities but will get CT head to rule out intracranial hemorrhage and CT cervical evaluate for cervical fracture given patient is not a great historian unsure if he hit his head.         ____________________________________________   FINAL CLINICAL IMPRESSION(S) / ED DIAGNOSES   Final diagnoses:  Altered mental status, unspecified altered mental status type  AKI (acute kidney injury) ([place])      MEDICATIONS GIVEN DURING THIS VISIT:  Medications  sodium chloride 0.9 % bolus 1,000 mL (1,000 mLs Intravenous New Bag/Given 11/03/19 5009)     ED Discharge Orders    None       Note:  This document was prepared using Dragon voice recognition software and may include unintentional dictation errors.   Vanessa [place], MD 11/03/19 1051

## 2019-11-03 NOTE — H&P (Signed)
History and Physical    [name] LFY:[id number] DOB: 05/06/1951 DOA: 11/03/2019  PCP: Patient, No Pcp Per   Patient coming from: [facility] independent living  I have personally briefly reviewed patient's old medical records in [place]  Chief Complaint: Change in mental status  HPI: [name] is a 69 y.o. 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  Patient was found by another independent living resident walking outside with only a shirt on and naked from the waist down.  Patient is only oriented to person and place but is unable to provide any history at this time.  Patient had a fall while in the emergency room and was able to be assisted back into bed by the nursing staff. While in the emergency room patient had an episode of tachycardia which was transient, heart rate was as high as 137 but he is currently back in sinus rhythm.  ED Course: Patient is a 69 year old male sent to the emergency room for evaluation of mental status changes.  Patient is oriented to person and place but is unable to provide any history at this time. He will be admitted to the hospital for further evaluation.    Review of Systems: As per HPI otherwise 10 point review of systems negative.    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 He does not have any smokeless tobacco history on file. He reports current alcohol use. He reports previous drug use.  Allergies  Allergen Reactions   Amoxicillin    Oxymorphone    Sulfa Antibiotics    Sulfur     History reviewed. No pertinent family history.   Prior to Admission medications   Not on File    Physical Exam: Vitals:   11/03/19 0830 11/03/19 0900 11/03/19 0930 11/03/19 1000  BP: (!) 135/98 (!) 159/92 (!) 140/98 129/68  Pulse: 86 78 83 81  Resp: (!) 27 (!) 25 (!) 21 19  Temp:      TempSrc:      SpO2: 100% 100% 99% 99%  Weight:      Height:         Vitals:   11/03/19 0830 11/03/19 0900 11/03/19 0930 11/03/19 1000  BP: (!) 135/98 (!) 159/92 (!) 140/98 129/68  Pulse: 86 78 83 81  Resp: (!) 27 (!) 25 (!) 21 19  Temp:      TempSrc:      SpO2: 100% 100% 99% 99%  Weight:      Height:        Constitutional:  NAD, alert and oriented to person and place Eyes: PERRL, lids and conjunctivae normal ENMT: Mucous membranes are moist.  Neck: normal, supple, no masses, no thyromegaly Respiratory: clear to auscultation bilaterally, no wheezing, no crackles. Normal respiratory effort. No accessory muscle use.  Cardiovascular: Regular rate and rhythm,no murmurs / rubs / gallops. No extremity edema. 2+ pedal pulses. No carotid bruits.  Abdomen: no tenderness, no masses palpated. No hepatosplenomegaly. Bowel sounds positive.  Musculoskeletal: no clubbing / cyanosis. No joint deformity upper and lower extremities.  Skin: no rashes, lesions, ulcers.  Neurologic: No gross focal neurologic deficit.  Able to move all extremities Psychiatric: Normal mood and affect.   Labs on Admission: I have personally reviewed following labs and imaging studies  CBC: Recent Labs  Lab 11/03/19 0719  WBC 6.4    NEUTROABS 5.1  HGB 13.4  HCT 39.0  MCV 92.2  PLT 628   Basic Metabolic Panel: Recent Labs  Lab 11/03/19 0719  NA 139  K 3.6  CL 102  CO2 20*  GLUCOSE 114*  BUN 17  CREATININE 1.63*  CALCIUM 9.4   GFR: Estimated Creatinine Clearance: 44 mL/min (A) (by C-G formula based on SCr of 1.63 mg/dL (H)). Liver Function Tests: Recent Labs  Lab 11/03/19 0719  AST 44*  ALT 15  ALKPHOS 49  BILITOT 1.1  PROT 7.4  ALBUMIN 4.1   No results for input(s): LIPASE, AMYLASE in the last 168 hours. Recent Labs  Lab 11/03/19 0719  AMMONIA 12   Coagulation Profile: No results for input(s): INR, PROTIME in the last 168 hours. Cardiac Enzymes: No results for input(s): CKTOTAL, CKMB, CKMBINDEX, TROPONINI in the last 168 hours. BNP (last 3 results) No results for input(s): PROBNP in the last 8760 hours. HbA1C: No results for input(s): HGBA1C in the last 72 hours. CBG: No results for input(s): GLUCAP in the last 168 hours. Lipid Profile: No results for input(s): CHOL, HDL, LDLCALC, TRIG, CHOLHDL, LDLDIRECT in the last 72 hours. Thyroid Function Tests: No results for input(s): TSH, T4TOTAL, FREET4, T3FREE, THYROIDAB in the last 72 hours. Anemia Panel: No results for input(s): VITAMINB12, FOLATE, FERRITIN, TIBC, IRON, RETICCTPCT in the last 72 hours. Urine analysis: No results found for: COLORURINE, APPEARANCEUR, [place], [place], GLUCOSEU, HGBUR, BILIRUBINUR, KETONESUR, PROTEINUR, UROBILINOGEN, NITRITE, LEUKOCYTESUR  Radiological Exams on Admission: CT Head Wo Contrast  Result Date: 11/03/2019 CLINICAL DATA:  Altered mental status.  Confusion.  Headache. EXAM: CT HEAD WITHOUT CONTRAST TECHNIQUE: Contiguous axial images were obtained from the base of the skull through the vertex without intravenous contrast. COMPARISON:  None. FINDINGS: Brain: No evidence of acute infarction, hemorrhage, hydrocephalus, extra-axial collection or mass lesion/mass effect. Diffuse mild cerebral cortical and  cerebellar atrophy. Vascular: No hyperdense vessel or unexpected calcification. Skull: Normal. Negative for fracture or focal lesion. Sinuses/Orbits: Normal. Other: None IMPRESSION: No acute abnormality. Diffuse mild atrophy. Electronically Signed   By: Lorriane Shire M.D.   On: 11/03/2019 07:52   DG Chest Portable 1 View  Result Date: 11/03/2019 CLINICAL DATA:  Shortness of breath. Altered mental status. Slurred speech. EXAM: PORTABLE CHEST 1 VIEW COMPARISON:  None. FINDINGS: Power port in place in good position with the tip in the superior vena cava just below the carina. Heart size and pulmonary vascularity are normal. Lungs are clear. No bone abnormality. IMPRESSION: No acute cardiopulmonary disease. Electronically Signed   By: Lorriane Shire M.D.   On: 11/03/2019 07:51    EKG: Independently reviewed.  Sinus rhythm  Assessment/Plan Active Problems:  Encephalopathy acute    Acute encephalopathy Unclear etiology We will obtain ammonia levels to rule out hepatic encephalopathy Obtain MRI of the brain to rule out an acute infarct and rule out possible metastatic disease from his known nasopharyngeal carcinoma Patient will need a 1: 1 safety sitter due to his altered mental status Awaiting results of urine toxicology Will allow for permissive hypertension until an acute stroke is ruled out   Fall Place patient on fall precautions   Seizure disorder Place patient on seizure precautions  DVT prophylaxis: SCD Code Status: Full code Family Communication: Called patient's next of kin, [employer] @ [phone].  All questions and concerns have been addressed. Disposition Plan: Back to previous home environment Consults called: None    Madylyn Insco MD [facility]     11/03/2019, 11:03 AM

## 2019-11-03 NOTE — ED Notes (Signed)
This RN notified by MRI that patient found sitting on end of bed despite fall alarm. Will notify when patient returns from MRI.

## 2019-11-03 NOTE — ED Notes (Signed)
Pt returned from MRI. Medications administered per order. [place] fall alarm remains in place at this time. Admitting MD messaged regarding orders for safety sitter. Pt resting in bed with NAD noted, awakens easily with mild stimuli.

## 2019-11-03 NOTE — ED Notes (Signed)
This Rn to bedside, blood work collected by this Therapist, sports. Pt resting in bed with respirations even and unlabored. Pt awakens with mild stimuli then returns to sleep. This RN messaged admitting MD regarding oral ASA and patient's AMS, per admitting MD, give PR.

## 2019-11-03 NOTE — ED Triage Notes (Signed)
Pt arrives acems from [place] where pt was found by another independent living resident walking around outside w shirt on but naked from waist down. Pt oriented to self. VSS

## 2019-11-03 NOTE — ED Notes (Signed)
X-ray at bedside at this time.

## 2019-11-03 NOTE — Evaluation (Signed)
Clinical/Bedside Swallow Evaluation Patient Details  Name: [name] MRN: [id number] Date of Birth: [birth year]  Today's Date: 11/03/2019 Time: SLP Start Time (ACUTE ONLY): 1548 SLP Stop Time (ACUTE ONLY): 1600 SLP Time Calculation (min) (ACUTE ONLY): 12 min  Past Medical History:  Past Medical History:  Diagnosis Date  . Alcohol abuse   . Benzodiazepine dependence ([place])   . Benzodiazepine withdrawal ([place])   . Chronic pain in right foot   . COPD (chronic obstructive pulmonary disease) ([place])   . Depression   . Hepatitis C   . Hypertension   . 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 Seizures ([place])   . Sleep apnea   . Syncope    Past Surgical History:  Past Surgical History:  Procedure Laterality Date  . COLONOSCOPY WITH PROPOFOL    . FOOT SURGERY    . HEMORRHOID SURGERY    . LITHOTRIPSY    . MYRINGOTOMY    . NASOPHARYNGOSCOPY    . Port a cath Placement    . TONSILLECTOMY     HPI:  [name] is a 69 y.o. 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 is only oriented to person and place but is unable to provide any history at this time. CT Head, MRI and chest x-ray all negative. UA pending. No information in chart regarding nasopharyngeal cancer.    Assessment / Plan / Recommendation Clinical Impression  At bedside, pt is confusion with significant hyponasal speech likely related to history of nasopharyngeal cancer. Pt with immediate coughing during each sip of thin liquids, suggestive of possible discoordinated swallow and aspiration. At this time, recommend pt remain NPO with ST to follow on next available day.  SLP Visit Diagnosis: Dysphagia, unspecified (R13.10)    Aspiration Risk  Severe aspiration risk;Risk for inadequate  nutrition/hydration    Diet Recommendation   NPO  Medication Administration: Via alternative means    Other  Recommendations Oral Care Recommendations: Oral care QID Other Recommendations: Remove water pitcher;Have oral suction available   Follow up Recommendations  (TBD)      Frequency and Duration min 2x/week  2 weeks       Prognosis Prognosis for Safe Diet Advancement: Fair Barriers to Reach Goals: Cognitive deficits;Behavior      Swallow Study   General Date of Onset: 11/03/19 HPI: [name] is a 69 y.o. 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 is only oriented to person and place but is unable to provide any history at this time. CT Head, MRI and chest x-ray all negative. UA pending. No information in chart regarding nasopharyngeal cancer.  Type of Study: Bedside Swallow Evaluation Previous Swallow Assessment: none in chart Diet Prior to this Study: NPO Temperature Spikes Noted: No Respiratory Status: Room air History of Recent Intubation: No Behavior/Cognition: Alert;Confused;Agitated;Doesn't follow directions Oral Care Completed by SLP: Recent completion by staff Oral Cavity - Dentition: Edentulous Self-Feeding Abilities: Total assist Patient Positioning: Upright in bed Baseline Vocal Quality:  (hyponasal) Volitional Cough: Congested;Cognitively unable to elicit Volitional Swallow: Unable to elicit    Oral/Motor/Sensory Function     Ice Chips Ice chips: Not tested   Thin Liquid Thin  Liquid: Impaired Presentation: Straw;Cup Oral Phase Impairments: Poor awareness of bolus Pharyngeal  Phase Impairments: Suspected delayed Swallow;Multiple swallows;Wet Vocal Quality;Cough - Immediate    Nectar Thick Nectar Thick Liquid:  Not tested   Honey Thick Honey Thick Liquid: Not tested   Puree Puree: Not tested   Solid     Solid: Not tested     [name] B. Rutherford Nail M.S., CCC-SLP, [place] [facility] [place] 11/03/2019,4:36 PM

## 2019-11-03 NOTE — ED Notes (Signed)
Spoke with admitting MD via secure chat, VORB to change 1:1 sitter for telesitter.

## 2019-11-03 NOTE — ED Notes (Signed)
This RN, Joellen Jersey, RN, and Lonn [place], EDT to bedside, In and Out cath performed by this RN. Pt tolerated well. Happi, SLP at bedside to perform swallow screen, per Happi, SLP pt failed swallow screen and patient to remain NPO at this time.

## 2019-11-03 NOTE — ED Notes (Signed)
This RN to bedside to round on patient. Pt visualized standing at side of bed, states " I need to piss" despite having tele-sitter at bedside with confirmed visual on patient. Pt offered urinal, pt noted to have urinated on floor at this time. This RN and Opal Sidles, RN placed patient back in bed with clean yellow socks, clean dry brief placed on patient. This RN called tele-sitter to re-confirm that they do have visual on patient, tele-sitter confirmed that they do have visual on patient at this time.   Admitting MD also made aware that patient noted to be hallucinating at this time. While this RN at bedside, pt noted to be speaking to [place] and having conversation, when asked who Lissa Hoard is pt states "that guy over there behind you", no male noted to be in room behind this RN.

## 2019-11-03 NOTE — ED Notes (Signed)
Pt taken to CT at this time.

## 2019-11-03 NOTE — ED Notes (Signed)
This RN and Lilia Pro, RN Student to bedside, introduced selves to patient. Pt noted to be resting in bed. Blood work collected at this time. X-ray to bedside. Pt noted to continue to be confused, speaking nonsensically with mildly slurred speech. Pt calm and cooperative with staff instructions.

## 2019-11-03 NOTE — ED Notes (Signed)
This RN to bedside due to patient cardiac alarms going off. Just prior to this RN arrival to bedside, loud sound heard. Upon this RN arrival to bedside pt found on floor at foot of bed on L hip. Pt able to assist with standing with 2 person assist. No obvious injury, deformity noted. Pt does not complain of any pain. Pt back to bed by this RN and Lilia Pro, Radio producer. EDP Funke made aware of patient's fall, to bedside to assess patient. Scans ordered by EDP.

## 2019-11-03 NOTE — ED Notes (Signed)
Pt transported to MRI at this time 

## 2019-11-03 NOTE — ED Notes (Signed)
Pt returned from [place], this RN verified that fall alarm on upon arrival to room.

## 2019-11-03 NOTE — ED Notes (Signed)
This RN and Opal Sidles, RN to bedside due to tele-sitter calling out stating patient attempting to get up. Pt back to bed, given urinal by this RN. Pt given 2 more warm blankets by this RN. Tele sitter remains at bedside at this time.

## 2019-11-03 NOTE — ED Notes (Signed)
This RN gave patient information to [facility] to have [facility] attempt to find contact information for patient, per [facility] unable to find any information on patient in system. This RN to bedside to speak with patient, pt gave this RN different last name. This RN able to find patient in system, pt's name not Arma Heading DOB May 17, 1950. Pt's name [name]. Esperanza Sheets, DOB 03/24/1951, able to verify with patient's address as well as patient is from [facility] on [address]. Registration notified, charts marked for merge. Pt's PMH and allergies updated by this RN.

## 2019-11-04 ENCOUNTER — Encounter: Payer: Self-pay | Admitting: Internal Medicine

## 2019-11-04 ENCOUNTER — Other Ambulatory Visit: Payer: Self-pay

## 2019-11-04 DIAGNOSIS — I10 Essential (primary) hypertension: Secondary | ICD-10-CM | POA: Insufficient documentation

## 2019-11-04 DIAGNOSIS — N179 Acute kidney failure, unspecified: Secondary | ICD-10-CM

## 2019-11-04 DIAGNOSIS — R531 Weakness: Secondary | ICD-10-CM

## 2019-11-04 DIAGNOSIS — I152 Hypertension secondary to endocrine disorders: Secondary | ICD-10-CM | POA: Insufficient documentation

## 2019-11-04 DIAGNOSIS — G9341 Metabolic encephalopathy: Secondary | ICD-10-CM

## 2019-11-04 DIAGNOSIS — C119 Malignant neoplasm of nasopharynx, unspecified: Secondary | ICD-10-CM

## 2019-11-04 LAB — CBC
HCT: 38.4 % — ABNORMAL LOW (ref 39.0–52.0)
Hemoglobin: 13.3 g/dL (ref 13.0–17.0)
MCH: 31.9 pg (ref 26.0–34.0)
MCHC: 34.6 g/dL (ref 30.0–36.0)
MCV: 92.1 fL (ref 80.0–100.0)
Platelets: 153 10*3/uL (ref 150–400)
RBC: 4.17 MIL/uL — ABNORMAL LOW (ref 4.22–5.81)
RDW: 14.2 % (ref 11.5–15.5)
WBC: 5.3 10*3/uL (ref 4.0–10.5)
nRBC: 0 % (ref 0.0–0.2)

## 2019-11-04 LAB — BASIC METABOLIC PANEL
Anion gap: 12 (ref 5–15)
BUN: 14 mg/dL (ref 8–23)
CO2: 22 mmol/L (ref 22–32)
Calcium: 8.7 mg/dL — ABNORMAL LOW (ref 8.9–10.3)
Chloride: 107 mmol/L (ref 98–111)
Creatinine, Ser: 1.06 mg/dL (ref 0.61–1.24)
GFR calc Af Amer: >60 mL/min (ref >60)
GFR calc non Af Amer: >60 mL/min (ref >60)
Glucose, Bld: 90 mg/dL (ref 70–99)
Potassium: 3.5 mmol/L (ref 3.5–5.1)
Sodium: 141 mmol/L (ref 135–145)

## 2019-11-04 MED ORDER — ALPRAZOLAM 0.5 MG PO TABS: 0.5000 mg | ORAL_TABLET | Freq: Two times a day (BID) | ORAL | Status: DC | PRN

## 2019-11-04 MED ORDER — OXYCODONE HCL 5 MG PO TABS
5.0000 mg | ORAL_TABLET | Freq: Four times a day (QID) | ORAL | Status: DC | PRN
  Administered 2019-11-04 (×2): 5 mg via ORAL
  Filled 2019-11-04 (×2): qty 1

## 2019-11-04 NOTE — Progress Notes (Signed)
SLP Follow up Note  Patient Details Name: [name] MRN: [id number] DOB: May 03, 1951   Pt placed on regular diet with thin liquids by MD. This writer spoke with pt's nurse, who states that pt has been consuming thin liquids without overt s/s of aspiration. Pt's respiratory status continues to be stable. At this time, skilled ST intervention doesn't appear indicated. Please re-consult if pt's condition changes.   Gianmarco Roye B. Rutherford Nail M.S., CCC-SLP, [place] Office [phone]   Stormy Fabian 11/04/2019, 9:31 AM

## 2019-11-04 NOTE — ED Notes (Signed)
Pt awake and requesting water. Water provided and pt repositioned in bed, light turned off. Tele sitter on place.

## 2019-11-04 NOTE — Progress Notes (Signed)
Patient ID: [name], male   DOB: November 12, 1950, 69 y.o.   MRN: [id number] [facility] PROGRESS NOTE  Erle Guster OZH:[id number] DOB: 05-17-1950 DOA: 11/03/2019 PCP: Patient, No Pcp Per  HPI/Subjective: Patient came in with change in mental status.  Patient does not recall how he got here.  He lives by himself.  Objective: Vitals:   11/04/19 0908 11/04/19 0915  BP:  140/74  Pulse:  (!) 135  Resp: 16   Temp:    SpO2:  100%    Intake/Output Summary (Last 24 hours) at 11/04/2019 1356 Last data filed at 11/03/2019 1742 Gross per 24 hour  Intake --  Output 275 ml  Net -275 ml   Filed Weights   11/03/19 0641  Weight: 77.1 kg    ROS: Review of Systems  Respiratory: Negative for shortness of breath.   Cardiovascular: Negative for chest pain.  Gastrointestinal: Negative for abdominal pain.  Musculoskeletal: Positive for joint pain.   Exam: Physical Exam  HENT:  Nose: No mucosal edema.  Mouth/Throat: No oropharyngeal exudate.  Eyes: Pupils are equal, round, and reactive to light. Conjunctivae and lids are normal.  Cardiovascular: S1 normal and S2 normal. Exam reveals no gallop.  No murmur heard. Respiratory: No respiratory distress. He has no wheezes. He has no rhonchi. He has no rales.  GI: Soft. Bowel sounds are normal. There is no abdominal tenderness.  Musculoskeletal:     Right ankle: No swelling.     Left ankle: No swelling.  Neurological: He is alert. No cranial nerve deficit.  Skin: Skin is warm.      Data Reviewed: Basic Metabolic Panel: Recent Labs  Lab 11/03/19 0719 11/04/19 0710  NA 139 141  K 3.6 3.5  CL 102 107  CO2 20* 22  GLUCOSE 114* 90  BUN 17 14  CREATININE 1.63* 1.06  CALCIUM 9.4 8.7*   Liver Function Tests: Recent Labs  Lab 11/03/19 0719  AST 44*  ALT 15  ALKPHOS 49  BILITOT 1.1  PROT 7.4  ALBUMIN 4.1    Recent Labs  Lab 11/03/19 0719  AMMONIA 12   CBC: Recent Labs  Lab 11/03/19 0719 11/04/19 0710   WBC 6.4 5.3  NEUTROABS 5.1  --   HGB 13.4 13.3  HCT 39.0 38.4*  MCV 92.2 92.1  PLT 177 153     Recent Results (from the past 240 hour(s))  SARS Coronavirus 2 by RT PCR (hospital order, performed in [facility] hospital lab) Nasopharyngeal Nasopharyngeal Swab     Status: None   Collection Time: 11/03/19  9:22 AM   Specimen: Nasopharyngeal Swab  Result Value Ref Range Status   SARS Coronavirus 2 NEGATIVE NEGATIVE Final    Comment: (NOTE) SARS-CoV-2 target nucleic acids are NOT DETECTED.  The SARS-CoV-2 RNA is generally detectable in upper and lower respiratory specimens during the acute phase of infection. The lowest concentration of SARS-CoV-2 viral copies this assay can detect is 250 copies / mL. A negative result does not preclude SARS-CoV-2 infection and should not be used as the sole basis for treatment or other patient management decisions.  A negative result may occur with improper specimen collection / handling, submission of specimen other than nasopharyngeal swab, presence of viral mutation(s) within the areas targeted by this assay, and inadequate number of viral copies (<250 copies / mL). A negative result must be combined with clinical observations, patient history, and epidemiological information.  Fact Sheet for Patients:   StrictlyIdeas.no  Fact Sheet for Healthcare Providers:  BankingDealers.co.za  This test is not yet approved or  cleared by the [place] and has been authorized for detection and/or diagnosis of SARS-CoV-2 by FDA under an Emergency Use Authorization (EUA).  This EUA will remain in effect (meaning this test can be used) for the duration of the COVID-19 declaration under Section 564(b)(1) of the Act, 21 U.S.C. section 360bbb-3(b)(1), unless the authorization is terminated or revoked sooner.  Performed at [facility], [address] [place], [place] [address]       Studies: DG Abd 1 View  Result Date: 11/03/2019 CLINICAL DATA:  Metal screening prior to MRI EXAM: ABDOMEN - 1 VIEW COMPARISON:  None. FINDINGS: No metallic foreign bodies identified on included image of the abdomen. The bowel gas pattern is normal. No radio-opaque calculi or other significant radiographic abnormality are seen. IMPRESSION: No metallic foreign bodies identified on included image of the abdomen. Electronically Signed   By: Davina Poke D.O.   On: 11/03/2019 12:26   CT Head Wo Contrast  Result Date: 11/03/2019 CLINICAL DATA:  Fall. EXAM: CT HEAD WITHOUT CONTRAST CT CERVICAL SPINE WITHOUT CONTRAST TECHNIQUE: Multidetector CT imaging of the head and cervical spine was performed following the standard protocol without intravenous contrast. Multiplanar CT image reconstructions of the cervical spine were also generated. COMPARISON:  Same day. FINDINGS: CT HEAD FINDINGS Brain: Mild diffuse cortical atrophy is noted. Mild chronic ischemic white matter disease is noted. No mass effect or midline shift is noted. Ventricular size is within normal limits. There is no evidence of mass lesion, hemorrhage or acute infarction. Vascular: No hyperdense vessel or unexpected calcification. Skull: Normal. Negative for fracture or focal lesion. Sinuses/Orbits: No acute finding. Other: None. CT CERVICAL SPINE FINDINGS Alignment: Mild grade 1 retrolisthesis is noted at C5-6 secondary to severe degenerative disc disease at this level. Skull base and vertebrae: No acute fracture. No primary bone lesion or focal pathologic process. Soft tissues and spinal canal: No prevertebral fluid or swelling. No visible canal hematoma. Disc levels: Severe degenerative disc disease is noted at C5-6 and C6-7 with anterior osteophyte formation. Upper chest: Negative. Other: None. IMPRESSION: 1. Mild diffuse cortical atrophy. Mild chronic ischemic white matter disease. No acute intracranial abnormality seen. 2. Severe multilevel  degenerative disc disease. No acute abnormality seen in the cervical spine. Electronically Signed   By: Marijo Conception M.D.   On: 11/03/2019 12:00   CT Head Wo Contrast  Result Date: 11/03/2019 CLINICAL DATA:  Altered mental status.  Confusion.  Headache. EXAM: CT HEAD WITHOUT CONTRAST TECHNIQUE: Contiguous axial images were obtained from the base of the skull through the vertex without intravenous contrast. COMPARISON:  None. FINDINGS: Brain: No evidence of acute infarction, hemorrhage, hydrocephalus, extra-axial collection or mass lesion/mass effect. Diffuse mild cerebral cortical and cerebellar atrophy. Vascular: No hyperdense vessel or unexpected calcification. Skull: Normal. Negative for fracture or focal lesion. Sinuses/Orbits: Normal. Other: None IMPRESSION: No acute abnormality. Diffuse mild atrophy. Electronically Signed   By: Lorriane Shire M.D.   On: 11/03/2019 07:52   CT Cervical Spine Wo Contrast  Result Date: 11/03/2019 CLINICAL DATA:  Fall. EXAM: CT HEAD WITHOUT CONTRAST CT CERVICAL SPINE WITHOUT CONTRAST TECHNIQUE: Multidetector CT imaging of the head and cervical spine was performed following the standard protocol without intravenous contrast. Multiplanar CT image reconstructions of the cervical spine were also generated. COMPARISON:  Same day. FINDINGS: CT HEAD FINDINGS Brain: Mild diffuse cortical atrophy is noted. Mild chronic ischemic white matter disease is noted. No mass  effect or midline shift is noted. Ventricular size is within normal limits. There is no evidence of mass lesion, hemorrhage or acute infarction. Vascular: No hyperdense vessel or unexpected calcification. Skull: Normal. Negative for fracture or focal lesion. Sinuses/Orbits: No acute finding. Other: None. CT CERVICAL SPINE FINDINGS Alignment: Mild grade 1 retrolisthesis is noted at C5-6 secondary to severe degenerative disc disease at this level. Skull base and vertebrae: No acute fracture. No primary bone lesion or  focal pathologic process. Soft tissues and spinal canal: No prevertebral fluid or swelling. No visible canal hematoma. Disc levels: Severe degenerative disc disease is noted at C5-6 and C6-7 with anterior osteophyte formation. Upper chest: Negative. Other: None. IMPRESSION: 1. Mild diffuse cortical atrophy. Mild chronic ischemic white matter disease. No acute intracranial abnormality seen. 2. Severe multilevel degenerative disc disease. No acute abnormality seen in the cervical spine. Electronically Signed   By: Marijo Conception M.D.   On: 11/03/2019 12:00   MR BRAIN WO CONTRAST  Result Date: 11/03/2019 CLINICAL DATA:  Acute encephalopathy. Altered mental status (AMS), unclear cause. Additional history provided: History of hypertension, hepatitis C, seizure disorder, COPD, alcohol abuse, nasopharyngeal carcinoma. EXAM: MRI HEAD WITHOUT CONTRAST TECHNIQUE: Multiplanar, multiecho pulse sequences of the brain and surrounding structures were obtained without intravenous contrast. COMPARISON:  Head CT 11/03/2019, brain MRI 05/25/2019. FINDINGS: Brain: Multiple sequences are motion degraded, limiting evaluation. Most notably, there is moderate motion degradation of the axial T2/FLAIR sequence, moderate motion degradation of the sagittal T1 weighted sequence and moderate motion degradation of the axial T1 weighted sequence. There is stable, mild generalized parenchymal atrophy. Stable mild patchy T2/FLAIR hyperintensity within the cerebral white matter which is nonspecific, but consistent with chronic small vessel ischemic disease. There is no acute infarct. No chronic intracranial blood products. No extra-axial fluid collection. No midline shift. There is inadequate reassessment of a known left nasopharyngeal carcinoma on this noncontrast examination. Vascular: Expected proximal arterial flow voids. Skull and upper cervical spine: No focal marrow lesion. Sinuses/Orbits: Visualized orbits show no acute finding. Trace  paranasal sinus mucosal thickening, greatest within the left maxillary sinus. Small left mastoid effusion. IMPRESSION: Motion degraded exam as described. Please note there is inadequate reassessment of a known left nasopharyngeal carcinoma on this non-contrast exam. No evidence of acute intracranial abnormality, including acute infarction. Stable mild generalized parenchymal atrophy and chronic small vessel ischemic disease. Small left mastoid effusion. Trace paranasal sinus mucosal thickening. Electronically Signed   By: Kellie Simmering DO   On: 11/03/2019 13:15   DG Chest Portable 1 View  Result Date: 11/03/2019 CLINICAL DATA:  Shortness of breath. Altered mental status. Slurred speech. EXAM: PORTABLE CHEST 1 VIEW COMPARISON:  None. FINDINGS: Power port in place in good position with the tip in the superior vena cava just below the carina. Heart size and pulmonary vascularity are normal. Lungs are clear. No bone abnormality. IMPRESSION: No acute cardiopulmonary disease. Electronically Signed   By: Lorriane Shire M.D.   On: 11/03/2019 07:51    Scheduled Meds: . sodium chloride flush  3 mL Intravenous Q12H   Continuous Infusions: . sodium chloride      Assessment/Plan:  1. Acute metabolic encephalopathy likely secondary to polypharmacy.  Discontinue long-acting pain medications.  Hold on gabapentin.  Spread out frequency of Xanax.  Use short acting pain medications instead.  Also discontinue Megace.  MRI of the brain negative for acute infarct.  Patient's mental status is better today.  Unable to contact family at this time. 2. Acute  kidney injury improved with IV fluids 3. Weakness we will get physical therapy evaluation 4. Nasopharyngeal carcinoma follow-up as outpatient 5. Essential hypertension blood pressure stable off medications    Code Status:     Code Status Orders  (From admission, onward)         Start     Ordered   11/03/19 1057  Full code  Continuous        11/03/19 1059         Code Status History    This patient has a current code status but no historical code status.   Advance Care Planning Activity     Family Communication: Tried to call the patient's sister at [phone] Disposition Plan: Status is: Inpatient  Dispo: The patient is from: Home              Anticipated d/c is to: Home              Anticipated d/c date is: 11/05/2019              Patient currently being followed closely for acute metabolic encephalopathy likely secondary to polypharmacy.  Need to start new regimen that he can handle with a good mental status.  Time spent: 28 minutes  [place]

## 2019-11-05 DIAGNOSIS — Z79899 Other long term (current) drug therapy: Secondary | ICD-10-CM

## 2019-11-05 LAB — BASIC METABOLIC PANEL
Anion gap: 12 (ref 5–15)
BUN: 14 mg/dL (ref 8–23)
CO2: 23 mmol/L (ref 22–32)
Calcium: 8.8 mg/dL — ABNORMAL LOW (ref 8.9–10.3)
Chloride: 107 mmol/L (ref 98–111)
Creatinine, Ser: 1 mg/dL (ref 0.61–1.24)
GFR calc Af Amer: >60 mL/min (ref >60)
GFR calc non Af Amer: >60 mL/min (ref >60)
Glucose, Bld: 102 mg/dL — ABNORMAL HIGH (ref 70–99)
Potassium: 3.6 mmol/L (ref 3.5–5.1)
Sodium: 142 mmol/L (ref 135–145)

## 2019-11-05 LAB — CBC
HCT: 40.9 % (ref 39.0–52.0)
Hemoglobin: 14.1 g/dL (ref 13.0–17.0)
MCH: 31.5 pg (ref 26.0–34.0)
MCHC: 34.5 g/dL (ref 30.0–36.0)
MCV: 91.3 fL (ref 80.0–100.0)
Platelets: 141 10*3/uL — ABNORMAL LOW (ref 150–400)
RBC: 4.48 MIL/uL (ref 4.22–5.81)
RDW: 14.1 % (ref 11.5–15.5)
WBC: 4 10*3/uL (ref 4.0–10.5)
nRBC: 0 % (ref 0.0–0.2)

## 2019-11-05 MED ORDER — OXYCODONE HCL 5 MG PO TABS: 5.0000 mg | ORAL_TABLET | Freq: Four times a day (QID) | ORAL | 0 refills | Status: DC | PRN

## 2019-11-05 MED ORDER — POTASSIUM CHLORIDE CRYS ER 20 MEQ PO TBCR
40.0000 meq | EXTENDED_RELEASE_TABLET | Freq: Once | ORAL | Status: AC
  Administered 2019-11-05: 08:00:00 40 meq via ORAL
  Filled 2019-11-05: qty 2

## 2019-11-05 MED ORDER — ALPRAZOLAM 0.5 MG PO TABS: 0.5000 mg | ORAL_TABLET | Freq: Two times a day (BID) | ORAL | 0 refills | Status: DC | PRN

## 2019-11-05 NOTE — Progress Notes (Signed)
Pt for discharge back to [facility]  To his apt. Alert/ no distress. Sl d/cd.  Discharge instructions discussed with pt.  meds presc/ change in med  ./ diet / activity and f/u discussed.  Verbalizes understanding of all.   Pt wheeled out by w/c and  Pt has  Voucher for  [facility]  To take him home . No c/o.

## 2019-11-05 NOTE — Plan of Care (Signed)
  Problem: Education: Goal: Knowledge of General Education information will improve Description: Including pain rating scale, medication(s)/side effects and non-pharmacologic comfort measures Outcome: Progressing   Problem: Health Behavior/Discharge Planning: Goal: Ability to manage health-related needs will improve Outcome: Progressing   Problem: Clinical Measurements: Goal: Ability to maintain clinical measurements within normal limits will improve Outcome: Progressing Goal: Will remain free from infection Outcome: Progressing Goal: Diagnostic test results will improve Outcome: Progressing Goal: Respiratory complications will improve Outcome: Progressing Goal: Cardiovascular complication will be avoided Outcome: Progressing   Problem: Clinical Measurements: Goal: Will remain free from infection Outcome: Progressing   Problem: Clinical Measurements: Goal: Diagnostic test results will improve Outcome: Progressing   Problem: Clinical Measurements: Goal: Respiratory complications will improve Outcome: Progressing   Problem: Clinical Measurements: Goal: Cardiovascular complication will be avoided Outcome: Progressing   Problem: Health Behavior/Discharge Planning: Goal: Ability to manage health-related needs will improve Outcome: Progressing   Problem: Education: Goal: Knowledge of General Education information will improve Description: Including pain rating scale, medication(s)/side effects and non-pharmacologic comfort measures Outcome: Progressing

## 2019-11-05 NOTE — Evaluation (Signed)
Physical Therapy Evaluation Patient Details Name: [name] MRN: [id number] DOB: Oct 12, 1950 Today's Date: 11/05/2019   History of Present Illness   69 y.o. 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  Clinical Impression  Pt is able to ambulate well with walker, slower and more cautious w/o but no LOBs or overt safety issues.  He was able to negotiate up/down flight of steps w/o assist and though he did not feel like walking more than the 100 ft we did he likely could have done more if needed.  He was more confident and safer with AD, encouraged him to use SPC initially when home.  Pt in agreement to have HHPT coming to the apartment at discharge.      Follow Up Recommendations Home health PT    Equipment Recommendations       Recommendations for Other Services       Precautions / Restrictions Precautions Precautions: Fall Restrictions Weight Bearing Restrictions: No      Mobility  Bed Mobility Overal bed mobility: Independent             General bed mobility comments: able to get to sitting w/o assist  Transfers Overall transfer level: Independent Equipment used: Rolling walker (2 wheeled)             General transfer comment: Pt able to rise to standing w/o assist, light use of UEs to maintain balance  Ambulation/Gait Ambulation/Gait assistance: Supervision Gait Distance (Feet): 100 Feet Assistive device: Rolling walker (2 wheeled);None   Gait velocity: slow, cautious with and w/o AD   General Gait Details: ~25 ft with walker, ~75 without AD (close to hallway rail, though never needed)   Stairs Stairs: Yes Stairs assistance: Min assist Stair Management: Two rails Number of Stairs: 13 General stair comments: Pt  was able to negotiate up/down steps w/o direct assist and w/o overt safety concerns  Wheelchair Mobility    Modified Rankin (Stroke Patients Only)       Balance Overall balance assessment: Needs assistance Sitting-balance support: No upper extremity supported Sitting balance-Leahy Scale: Normal     Standing balance support: No upper extremity supported Standing balance-Leahy Scale: Fair Standing balance comment: increased confidence with RW/UEs with no overt LOBs                              Pertinent Vitals/Pain Pain Assessment: No/denies pain    Home Living Family/patient expects to be discharged to:: Private residence Living Arrangements: Alone Available Help at Discharge: Neighbor;Family Type of Home: Apartment Home Access: Stairs to enter Entrance Stairs-Rails: Can reach both Entrance Stairs-Number of Steps: flight Home Layout: One level Home Equipment: Cane - single point      Prior Function Level of Independence: Independent         Comments: Pt reports that he is able to drive, run errands, etc.  Does endorse 8+ falls in the last 6 months     Hand Dominance        Extremity/Trunk Assessment   Upper Extremity Assessment Upper Extremity Assessment: Overall WFL for tasks assessed    Lower Extremity Assessment Lower Extremity Assessment: Overall WFL for tasks assessed       Communication   Communication: No difficulties  Cognition Arousal/Alertness: Awake/alert Behavior During Therapy:  WFL for tasks assessed/performed Overall Cognitive Status: Within Functional Limits for tasks assessed                                        General Comments      Exercises     Assessment/Plan    PT Assessment Patient needs continued PT services  PT Problem List Decreased range of motion;Decreased coordination;Decreased activity tolerance;Decreased strength;Decreased balance;Decreased safety awareness;Decreased knowledge of use  of DME       PT Treatment Interventions DME instruction;Gait training;Stair training;Functional mobility training;Therapeutic activities;Therapeutic exercise;Balance training;Neuromuscular re-education;Patient/family education    PT Goals (Current goals can be found in the Care Plan section)  Acute Rehab PT Goals Patient Stated Goal: Go home PT Goal Formulation: With patient Time For Goal Achievement: 11/19/19 Potential to Achieve Goals: Good    Frequency Min 2X/week   Barriers to discharge        Co-evaluation               AM-PAC PT "6 Clicks" Mobility  Outcome Measure Help needed turning from your back to your side while in a flat bed without using bedrails?: None Help needed moving from lying on your back to sitting on the side of a flat bed without using bedrails?: None Help needed moving to and from a bed to a chair (including a wheelchair)?: None Help needed standing up from a chair using your arms (e.g., wheelchair or bedside chair)?: None Help needed to walk in hospital room?: A Little Help needed climbing 3-5 steps with a railing? : A Little 6 Click Score: 22    End of Session Equipment Utilized During Treatment: Gait belt Activity Tolerance: Patient tolerated treatment well;Patient limited by fatigue Patient left: with chair alarm set;with call bell/phone within reach Nurse Communication: Mobility status PT Visit Diagnosis: Other abnormalities of gait and mobility (R26.89);Muscle weakness (generalized) (M62.81)    Time: 3244-0102 PT Time Calculation (min) (ACUTE ONLY): 25 min   Charges:   PT Evaluation $PT Eval Low Complexity: 1 Low PT Treatments $Gait Training: 8-22 mins        Kreg Shropshire, DPT 11/05/2019, 10:40 AM

## 2019-11-05 NOTE — Discharge Summary (Signed)
[facility] [place] NAME: [name]    MR#:  [id number]  DATE OF BIRTH:  Jan 31, 1951  DATE OF ADMISSION:  11/03/2019 ADMITTING PHYSICIAN: Collier Bullock, MD  DATE OF DISCHARGE: 11/05/2019  1:00 PM  PRIMARY CARE PHYSICIAN: Clayborn Bigness   ADMISSION DIAGNOSIS:  Acute metabolic encephalopathy  DISCHARGE DIAGNOSIS:  Active Problems:   Encephalopathy acute   SECONDARY DIAGNOSIS:   Past Medical History:  Diagnosis Date  . Alcohol abuse   . Benzodiazepine dependence ([place])   . Benzodiazepine withdrawal ([place])   . Chronic pain in right foot   . COPD (chronic obstructive pulmonary disease) ([place])   . Depression   . Hepatitis C   . Hypertension   . 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 Seizures ([place])   . Sleep apnea   . Syncope     HOSPITAL COURSE:   1.  Acute metabolic encephalopathy secondary to polypharmacy.  Discontinue long-acting pain medications.  Discontinue gabapentin.  I also told him I do not want him taking the Xanax as frequently.  Xanax can be twice a day as needed.  He already has prescription for this.  I did prescribe small amount of oxycodone as needed for pain.  12 pills only.  I advised that he only takes it if he is in pain.  Spoke with sister on the phone and she is also nervous about his medications.  Follow-up as outpatient. 2.  Acute kidney injury.  This has improved with IV fluids.  Creatinine was 1.63 on presentation and came down to 1 upon discharge. 3.  History of nasopharyngeal carcinoma.  Follow-up with Dr. Janese Banks as outpatient 4.  Essential hypertension.  Blood pressure stable.  Can go back on Norvasc. 5.  Weakness appreciate physical therapy evaluation  DISCHARGE CONDITIONS:   Satisfactory  CONSULTS OBTAINED:  None  DRUG ALLERGIES:   Allergies  Allergen Reactions  . Amoxicillin   . Oxymorphone   . Sulfa Antibiotics   . Sulfur     DISCHARGE MEDICATIONS:   Allergies as of 11/05/2019       Reactions   Amoxicillin    Oxymorphone    Sulfa Antibiotics    Sulfur       Medication List    STOP taking these medications   gabapentin 100 MG capsule Commonly known as: NEURONTIN   megestrol 625 MG/5ML suspension Commonly known as: MEGACE ES   oxyCODONE 20 mg 12 hr tablet Commonly known as: OXYCONTIN Replaced by: oxyCODONE 5 MG immediate release tablet   prochlorperazine 10 MG tablet Commonly known as: COMPAZINE     TAKE these medications   albuterol 108 (90 Base) MCG/ACT inhaler Commonly known as: VENTOLIN HFA Inhale 2 puffs into the lungs every 6 (six) hours as needed for wheezing or shortness of breath.   ALPRAZolam 0.5 MG tablet Commonly known as: XANAX Take 1 tablet (0.5 mg total) by mouth 2 (two) times daily as needed for anxiety. What changed: when to take this   amLODipine 5 MG tablet Commonly known as: NORVASC Take 5 mg by mouth daily.   aspirin EC 81 MG tablet Take 81 mg by mouth daily. Swallow whole.   magnesium gluconate 500 MG tablet Commonly known as: MAGONATE Take 500 mg by mouth 2 (two) times daily.   multivitamin tablet Take 1 tablet by mouth daily.   ondansetron 4 MG tablet Commonly known as: ZOFRAN Take 4 mg by mouth every 8 (eight) hours as needed for nausea  or vomiting.   oxyCODONE 5 MG immediate release tablet Commonly known as: Oxy IR/ROXICODONE Take 1 tablet (5 mg total) by mouth every 6 (six) hours as needed for severe pain. Replaces: oxyCODONE 20 mg 12 hr tablet        DISCHARGE INSTRUCTIONS:   Follow-up PMD 5 days Follow-up Dr. Janese Banks oncology 1 week  If you experience worsening of your admission symptoms, develop shortness of breath, life threatening emergency, suicidal or homicidal thoughts you must seek medical attention immediately by calling 911 or calling your MD immediately  if symptoms less severe.  You Must read complete instructions/literature along with all the possible adverse reactions/side effects for all the  Medicines you take and that have been prescribed to you. Take any new Medicines after you have completely understood and accept all the possible adverse reactions/side effects.   Please note  You were cared for by a hospitalist during your hospital stay. If you have any questions about your discharge medications or the care you received while you were in the hospital after you are discharged, you can call the unit and asked to speak with the hospitalist on call if the hospitalist that took care of you is not available. Once you are discharged, your primary care physician will handle any further medical issues. Please note that NO REFILLS for any discharge medications will be authorized once you are discharged, as it is imperative that you return to your primary care physician (or establish a relationship with a primary care physician if you do not have one) for your aftercare needs so that they can reassess your need for medications and monitor your lab values.    Today   CHIEF COMPLAINT:   Chief Complaint  Patient presents with  . Altered Mental Status    HISTORY OF PRESENT ILLNESS:  [name]  is a 69 y.o. male came in with altered mental status   VITAL SIGNS:  Blood pressure 113/72, pulse 76, temperature 98.1 F (36.7 C), temperature source Oral, resp. rate 18, height 5\' 9"  (1.753 m), weight 77.1 kg, SpO2 99 %.  PHYSICAL EXAMINATION:  GENERAL:  69 y.o.-year-old patient lying in the bed with no acute distress.  EYES: Pupils equal, round, reactive to light and accommodation. No scleral icterus. Extraocular muscles intact.  HEENT: Head atraumatic, normocephalic. Oropharynx and nasopharynx clear.   LUNGS: Normal breath sounds bilaterally, no wheezing, rales,rhonchi or crepitation. No use of accessory muscles of respiration.  CARDIOVASCULAR: S1, S2 normal. No murmurs, rubs, or gallops.  ABDOMEN: Soft, non-tender.  EXTREMITIES: No pedal edema.  NEUROLOGIC: Cranial nerves II  through XII are intact. Muscle strength 5/5 in all extremities. Sensation intact. Gait not checked.  PSYCHIATRIC: The patient is alert and oriented x 3.  SKIN: No obvious rash, lesion, or ulcer.   DATA REVIEW:   CBC Recent Labs  Lab 11/05/19 0430  WBC 4.0  HGB 14.1  HCT 40.9  PLT 141*    Chemistries  Recent Labs  Lab 11/03/19 0719 11/04/19 0710 11/05/19 0430  NA 139   < > 142  K 3.6   < > 3.6  CL 102   < > 107  CO2 20*   < > 23  GLUCOSE 114*   < > 102*  BUN 17   < > 14  CREATININE 1.63*   < > 1.00  CALCIUM 9.4   < > 8.8*  AST 44*  --   --   ALT 15  --   --  ALKPHOS 49  --   --   BILITOT 1.1  --   --    < > = values in this interval not displayed.     Microbiology Results  Results for orders placed or performed during the hospital encounter of 11/03/19  SARS Coronavirus 2 by RT PCR (hospital order, performed in [facility] hospital lab) Nasopharyngeal Nasopharyngeal Swab     Status: None   Collection Time: 11/03/19  9:22 AM   Specimen: Nasopharyngeal Swab  Result Value Ref Range Status   SARS Coronavirus 2 NEGATIVE NEGATIVE Final    Comment: (NOTE) SARS-CoV-2 target nucleic acids are NOT DETECTED.  The SARS-CoV-2 RNA is generally detectable in upper and lower respiratory specimens during the acute phase of infection. The lowest concentration of SARS-CoV-2 viral copies this assay can detect is 250 copies / mL. A negative result does not preclude SARS-CoV-2 infection and should not be used as the sole basis for treatment or other patient management decisions.  A negative result may occur with improper specimen collection / handling, submission of specimen other than nasopharyngeal swab, presence of viral mutation(s) within the areas targeted by this assay, and inadequate number of viral copies (<250 copies / mL). A negative result must be combined with clinical observations, patient history, and epidemiological information.  Fact Sheet for Patients:    StrictlyIdeas.no  Fact Sheet for Healthcare Providers: BankingDealers.co.za  This test is not yet approved or  cleared by the [place] FDA and has been authorized for detection and/or diagnosis of SARS-CoV-2 by FDA under an Emergency Use Authorization (EUA).  This EUA will remain in effect (meaning this test can be used) for the duration of the COVID-19 declaration under Section 564(b)(1) of the Act, 21 U.S.C. section 360bbb-3(b)(1), unless the authorization is terminated or revoked sooner.  Performed at [facility], [address], [place], [place] [address]       Management plans discussed with the patient, family and they are in agreement.  CODE STATUS:     Code Status Orders  (From admission, onward)         Start     Ordered   11/03/19 1057  Full code  Continuous        11/03/19 1059        Code Status History    This patient has a current code status but no historical code status.   Advance Care Planning Activity      TOTAL TIME TAKING CARE OF THIS PATIENT: 35 minutes.    Loletha Grayer M.D on 11/05/2019 at 4:14 PM  Between 7am to 6pm - Pager - [phone]  After 6pm go to [email or web address] - password [email or web address]  [facility]  CC: Primary care physician; Clayborn Bigness

## 2019-11-05 NOTE — Progress Notes (Signed)
Initial Nutrition Assessment  DOCUMENTATION CODES:   Non-severe (moderate) malnutrition in context of chronic illness  INTERVENTION:  CIB po daily, each supplement with 237 ml whole milk provides 280 kcal and 13 grams of protein MVI with minerals daily Education provided on ways to increase calories/protien  Orders have been reconciled for discharge, unable to provide MVI with minerals at this time  NUTRITION DIAGNOSIS:   Moderate Malnutrition related to chronic illness (throat cancer s/p chemo and radiation therapy) as evidenced by mild fat depletion, moderate fat depletion, moderate muscle depletion, severe muscle depletion.    GOAL:   Patient will meet greater than or equal to 90% of their needs    MONITOR:   PO intake, Supplement acceptance, Weight trends, Labs  REASON FOR ASSESSMENT:   Malnutrition Screening Tool    ASSESSMENT:  69 year old male with past medical history significant for HTN, Hep C, seizure disorder, COPD, alcohol abuse, benzodiazepine dependence, and nasopharyngeal cancer s/p chemotherapy and radiation admitted for evaluation of mental status changes after being found by another independent living resident walking outside with only a shirt on and naked from the waist down.  Met with patient at bedside, reports feeling much better today and is going home. Patient recalls eating most of his french toast, eggs, and bacon for breakfast, states the bacon was too salty. He reports eating mostly a soft diet at facility due to being edentulous and occasional swallowing difficulties s/p radiation therapy for throat cancer. Recalls eating meatloaf, soups, and eggs. He does not like Boost or Ensure supplements. Patient reports getting dentures a few days prior to admission, he has not worn them.   Patient endorses 42 lb wt loss, recalls weighing ~250 lbs prior to starting treatment for throat cancer in December. There is no weight history available to review. Patient  currently weighs 169.62 lbs. Patient with mild-moderate fat depletions, moderate-severe muscle depletion noted on exam, meets criteria for moderate malnutrition. Discussed weighs to increase calories/protein with meals, offered nutrient dense snack ideas, suggested trying CIB with whole milk for nutrition supplement. Patient appreciative, amenable to trying CIB on lunch tray.   Medications and labs reviewed  NUTRITION - FOCUSED PHYSICAL EXAM:    Most Recent Value  Orbital Region Mild depletion  Upper Arm Region Moderate depletion  Thoracic and Lumbar Region No depletion  Buccal Region Unable to assess  [edentulous]  Temple Region Moderate depletion  Clavicle Bone Region Moderate depletion  Clavicle and Acromion Bone Region Mild depletion  Scapular Bone Region No depletion  Dorsal Hand Moderate depletion  Patellar Region Severe depletion  Anterior Thigh Region Moderate depletion  Posterior Calf Region Severe depletion  Edema (RD Assessment) None  Hair Reviewed  Eyes Reviewed  Mouth Reviewed  Skin Reviewed  Nails Reviewed       Diet Order:   Diet Order            DIET SOFT Room service appropriate? Yes; Fluid consistency: Thin  Diet effective now                 EDUCATION NEEDS:   Education needs have been addressed  Skin:  Skin Assessment: Reviewed RN Assessment  Last BM:  6/23  Height:   Ht Readings from Last 1 Encounters:  11/03/19 5' 9"  (1.753 m)    Weight:   Wt Readings from Last 1 Encounters:  11/03/19 77.1 kg    BMI:  Body mass index is 25.1 kg/m.  Estimated Nutritional Needs:   Kcal:  2100-2300  Protein:  105-115  Fluid:  >/= 2.1 L/day   Lajuan Lines, RD, LDN Clinical Nutrition After Hours/Weekend Pager # in [place]

## 2019-11-08 ENCOUNTER — Encounter: Payer: Self-pay | Admitting: Oncology

## 2019-11-18 ENCOUNTER — Telehealth: Payer: Self-pay

## 2019-11-18 NOTE — Telephone Encounter (Signed)
Lmom to confirm and screen for 11-22-19 ov.

## 2019-11-22 ENCOUNTER — Ambulatory Visit: Payer: Medicare HMO | Admitting: Nurse Practitioner

## 2019-11-26 DIAGNOSIS — K219 Gastro-esophageal reflux disease without esophagitis: Secondary | ICD-10-CM | POA: Diagnosis not present

## 2019-11-26 DIAGNOSIS — Z8522 Personal history of malignant neoplasm of nasal cavities, middle ear, and accessory sinuses: Secondary | ICD-10-CM | POA: Diagnosis not present

## 2019-11-26 DIAGNOSIS — R682 Dry mouth, unspecified: Secondary | ICD-10-CM | POA: Diagnosis not present

## 2019-11-30 ENCOUNTER — Encounter: Payer: Self-pay | Admitting: Oncology

## 2019-11-30 ENCOUNTER — Other Ambulatory Visit: Payer: Self-pay

## 2019-11-30 ENCOUNTER — Inpatient Hospital Stay: Payer: Medicare HMO | Attending: Oncology | Admitting: Oncology

## 2019-11-30 ENCOUNTER — Inpatient Hospital Stay: Payer: Medicare HMO

## 2019-11-30 VITALS — BP 114/85 | HR 93 | Temp 98.0°F | Wt 167.1 lb

## 2019-11-30 DIAGNOSIS — Z882 Allergy status to sulfonamides status: Secondary | ICD-10-CM | POA: Diagnosis not present

## 2019-11-30 DIAGNOSIS — G9340 Encephalopathy, unspecified: Secondary | ICD-10-CM | POA: Insufficient documentation

## 2019-11-30 DIAGNOSIS — Z85818 Personal history of malignant neoplasm of other sites of lip, oral cavity, and pharynx: Secondary | ICD-10-CM | POA: Insufficient documentation

## 2019-11-30 DIAGNOSIS — R9082 White matter disease, unspecified: Secondary | ICD-10-CM | POA: Insufficient documentation

## 2019-11-30 DIAGNOSIS — I6782 Cerebral ischemia: Secondary | ICD-10-CM | POA: Diagnosis not present

## 2019-11-30 DIAGNOSIS — C119 Malignant neoplasm of nasopharynx, unspecified: Secondary | ICD-10-CM

## 2019-11-30 DIAGNOSIS — R682 Dry mouth, unspecified: Secondary | ICD-10-CM | POA: Insufficient documentation

## 2019-11-30 DIAGNOSIS — I10 Essential (primary) hypertension: Secondary | ICD-10-CM | POA: Insufficient documentation

## 2019-11-30 DIAGNOSIS — K117 Disturbances of salivary secretion: Secondary | ICD-10-CM

## 2019-11-30 DIAGNOSIS — R634 Abnormal weight loss: Secondary | ICD-10-CM | POA: Insufficient documentation

## 2019-11-30 DIAGNOSIS — R4781 Slurred speech: Secondary | ICD-10-CM | POA: Diagnosis not present

## 2019-11-30 DIAGNOSIS — N179 Acute kidney failure, unspecified: Secondary | ICD-10-CM | POA: Insufficient documentation

## 2019-11-30 DIAGNOSIS — Z8249 Family history of ischemic heart disease and other diseases of the circulatory system: Secondary | ICD-10-CM | POA: Insufficient documentation

## 2019-11-30 DIAGNOSIS — R4182 Altered mental status, unspecified: Secondary | ICD-10-CM | POA: Diagnosis not present

## 2019-11-30 DIAGNOSIS — Z811 Family history of alcohol abuse and dependence: Secondary | ICD-10-CM | POA: Insufficient documentation

## 2019-11-30 DIAGNOSIS — Z8719 Personal history of other diseases of the digestive system: Secondary | ICD-10-CM | POA: Diagnosis not present

## 2019-11-30 DIAGNOSIS — Z809 Family history of malignant neoplasm, unspecified: Secondary | ICD-10-CM | POA: Insufficient documentation

## 2019-11-30 DIAGNOSIS — R5383 Other fatigue: Secondary | ICD-10-CM | POA: Diagnosis not present

## 2019-11-30 DIAGNOSIS — R519 Headache, unspecified: Secondary | ICD-10-CM | POA: Insufficient documentation

## 2019-11-30 DIAGNOSIS — Z818 Family history of other mental and behavioral disorders: Secondary | ICD-10-CM | POA: Insufficient documentation

## 2019-11-30 DIAGNOSIS — Z880 Allergy status to penicillin: Secondary | ICD-10-CM | POA: Insufficient documentation

## 2019-11-30 DIAGNOSIS — Z95828 Presence of other vascular implants and grafts: Secondary | ICD-10-CM

## 2019-11-30 DIAGNOSIS — J449 Chronic obstructive pulmonary disease, unspecified: Secondary | ICD-10-CM | POA: Insufficient documentation

## 2019-11-30 DIAGNOSIS — Z885 Allergy status to narcotic agent status: Secondary | ICD-10-CM | POA: Diagnosis not present

## 2019-11-30 DIAGNOSIS — Z87891 Personal history of nicotine dependence: Secondary | ICD-10-CM | POA: Diagnosis not present

## 2019-11-30 DIAGNOSIS — Z79899 Other long term (current) drug therapy: Secondary | ICD-10-CM | POA: Insufficient documentation

## 2019-11-30 DIAGNOSIS — M50322 Other cervical disc degeneration at C5-C6 level: Secondary | ICD-10-CM | POA: Diagnosis not present

## 2019-11-30 DIAGNOSIS — Z8261 Family history of arthritis: Secondary | ICD-10-CM | POA: Insufficient documentation

## 2019-11-30 DIAGNOSIS — R0602 Shortness of breath: Secondary | ICD-10-CM | POA: Insufficient documentation

## 2019-11-30 DIAGNOSIS — R630 Anorexia: Secondary | ICD-10-CM | POA: Diagnosis not present

## 2019-11-30 DIAGNOSIS — Z841 Family history of disorders of kidney and ureter: Secondary | ICD-10-CM | POA: Insufficient documentation

## 2019-11-30 LAB — COMPREHENSIVE METABOLIC PANEL
ALT: 20 U/L (ref 0–44)
AST: 29 U/L (ref 15–41)
Albumin: 4 g/dL (ref 3.5–5.0)
Alkaline Phosphatase: 51 U/L (ref 38–126)
Anion gap: 10 (ref 5–15)
BUN: 8 mg/dL (ref 8–23)
CO2: 24 mmol/L (ref 22–32)
Calcium: 8.9 mg/dL (ref 8.9–10.3)
Chloride: 102 mmol/L (ref 98–111)
Creatinine, Ser: 1.18 mg/dL (ref 0.61–1.24)
GFR calc Af Amer: >60 mL/min (ref >60)
GFR calc non Af Amer: >60 mL/min (ref >60)
Glucose, Bld: 118 mg/dL — ABNORMAL HIGH (ref 70–99)
Potassium: 3.8 mmol/L (ref 3.5–5.1)
Sodium: 136 mmol/L (ref 135–145)
Total Bilirubin: 0.9 mg/dL (ref 0.3–1.2)
Total Protein: 6.7 g/dL (ref 6.5–8.1)

## 2019-11-30 LAB — CBC WITH DIFFERENTIAL/PLATELET
Abs Immature Granulocytes: 0.01 10*3/uL (ref 0.00–0.07)
Basophils Absolute: 0 10*3/uL (ref 0.0–0.1)
Basophils Relative: 0 %
Eosinophils Absolute: 0.1 10*3/uL (ref 0.0–0.5)
Eosinophils Relative: 1 %
HCT: 39.6 % (ref 39.0–52.0)
Hemoglobin: 13.9 g/dL (ref 13.0–17.0)
Immature Granulocytes: 0 %
Lymphocytes Relative: 14 %
Lymphs Abs: 0.7 10*3/uL (ref 0.7–4.0)
MCH: 31.2 pg (ref 26.0–34.0)
MCHC: 35.1 g/dL (ref 30.0–36.0)
MCV: 88.8 fL (ref 80.0–100.0)
Monocytes Absolute: 0.5 10*3/uL (ref 0.1–1.0)
Monocytes Relative: 10 %
Neutro Abs: 3.5 10*3/uL (ref 1.7–7.7)
Neutrophils Relative %: 75 %
Platelets: 134 10*3/uL — ABNORMAL LOW (ref 150–400)
RBC: 4.46 MIL/uL (ref 4.22–5.81)
RDW: 14.6 % (ref 11.5–15.5)
WBC: 4.7 10*3/uL (ref 4.0–10.5)
nRBC: 0 % (ref 0.0–0.2)

## 2019-11-30 LAB — MAGNESIUM: Magnesium: 1.8 mg/dL (ref 1.7–2.4)

## 2019-11-30 MED ORDER — HEPARIN SOD (PORK) LOCK FLUSH 100 UNIT/ML IV SOLN
500.0000 [IU] | Freq: Once | INTRAVENOUS | Status: AC
  Administered 2019-11-30: 500 [IU] via INTRAVENOUS
  Filled 2019-11-30: qty 5

## 2019-11-30 MED ORDER — OLANZAPINE 10 MG PO TABS: 10.0000 mg | ORAL_TABLET | Freq: Every day | ORAL | 1 refills | Status: DC

## 2019-11-30 MED ORDER — SODIUM CHLORIDE 0.9% FLUSH
10.0000 mL | INTRAVENOUS | Status: DC | PRN
  Administered 2019-11-30: 10 mL via INTRAVENOUS
  Filled 2019-11-30: qty 10

## 2019-11-30 NOTE — Progress Notes (Signed)
Pt if he gets up quickly from bed he gets dizzy- I have gave him instructions to lay on side, then slowly come up and put legs on side of bed and sit there for 1 min. Then slowly ease yourself to edge of bed and stand slowly. Once standing then also stay still for 1 min. And then start walking. Mouth is dry due to radiation and pt was given mint from EN[facility] to help with that. He is still on biotene. His appetite is 25% and his dentures do not fit because of wt loss. He has to get them refitted. I encouraged his to have ensure drinks

## 2019-11-30 NOTE — Progress Notes (Signed)
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 Pryor Ochoa for symptoms of ear pain and discharge. Patient also noticed associated hearing loss and tinnitus. Patient was noted to have erythema and edema in the left sphenopalatine fossa as well as an exophytic mass on NPL exam. CT soft tissue of the neck showed asymmetric soft tissue within the left nasopharynx measuring 2.4 x 2 x 3.2 cm. There is lateral bulging with encroachment upon the left parapharyngeal space without frank infiltration. No extension into oropharynx or nasal cavity. No skull base infiltration. No appreciable mass within the larynx. No pathologically enlarged cervical lymph nodes. 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 He was only able to receive 6 doses of weekly cisplatin 40 mg due to cytopenias and AKI   Interval history-reports that he does not have a good appetite.  He finds it difficult to chew his food because he does not have his teeth and dentures are loose fitting after he lost weight.   Also reports dry mouth.  Due to all these reasons he continues to lose weight and is Down to 167 pounds today from a prior weight of 186 2 months ago.  Denies any pain during swallowing   ECOG PS- 1 Pain scale- 0   Review of systems- Review of Systems  Constitutional: Positive for malaise/fatigue and weight loss. Negative for chills and fever.  HENT: Negative for congestion, ear discharge and nosebleeds.        Dry mouth  Eyes: Negative for blurred vision.  Respiratory: Negative for cough, hemoptysis, sputum production, shortness of breath and wheezing.   Cardiovascular: Negative for chest pain, palpitations, orthopnea and claudication.  Gastrointestinal: Negative for abdominal pain, blood in stool, constipation, diarrhea, heartburn, melena, nausea and vomiting.  Genitourinary: Negative for dysuria, flank pain, frequency, hematuria and urgency.  Musculoskeletal: Negative for back pain, joint pain and myalgias.  Skin: Negative for rash.  Neurological: Negative for dizziness, tingling, focal weakness, seizures, weakness and headaches.  Endo/Heme/Allergies: Does not bruise/bleed easily.  Psychiatric/Behavioral: Negative for depression and suicidal ideas. The patient does not have insomnia.      Allergies  Allergen Reactions  . Opana [Oxymorphone Hcl]     Made him BLACKOUT  . Amoxicillin Other (See Comments)  . Amoxicillin   . Oxymorphone Other (See Comments)  . Oxymorphone   . Sulfa Antibiotics Other (See Comments)  . Sulfa Antibiotics   . Sulfur     Childhood reaction      Past Medical History:  Diagnosis Date  . Alcohol abuse   . Benzodiazepine dependence ([place])   . Benzodiazepine withdrawal ([place])   . Chronic pain in right foot   . COPD (chronic obstructive pulmonary  disease) ([place])   . Depression 08/19/2017  . Depression   . Hepatitis C   . Hypertension   . 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[place])   . Seizures ([place]) 2010  . Seizures ([place])   . Sleep apnea   . Syncope     questionable vasovagal  . Syncope      Past Surgical History:  Procedure Laterality Date  . COLONOSCOPY WITH PROPOFOL N/A 12/16/2016   Procedure: COLONOSCOPY WITH PROPOFOL;  Surgeon: Lollie Sails, MD;  Location[facility] ENDOSCOPY;  Service: Endoscopy;  Laterality: N/A;  . COLONOSCOPY WITH PROPOFOL N/A 03/14/2017   Procedure: COLONOSCOPY WITH PROPOFOL;  Surgeon: Lollie Sails, MD;  Location: [facility] ENDOSCOPY;  Service: Endoscopy;  Laterality: N/A;  . COLONOSCOPY WITH PROPOFOL    . FOOT SURGERY Right 03/12/2002  . FOOT SURGERY    . HEMORRHOID SURGERY    . LITHOTRIPSY    . MYRINGOTOMY    . MYRINGOTOMY WITH TUBE PLACEMENT Left 04/28/2019   Procedure: MYRINGOTOMY WITH TUBE PLACEMENT;  Surgeon: Carloyn Manner, MD;  Location: [place];  Service: ENT;  Laterality: Left;  . NASOPHARYNGOSCOPY N/A 04/28/2019   Procedure: NASOPHARYNGOSCOPY WITH BIOPSY;  Surgeon: Carloyn Manner, MD;  Location: [place];  Service: ENT;  Laterality: N/A;  . NASOPHARYNGOSCOPY    . Port a cath Placement    . PORTA CATH INSERTION N/A 05/20/2019   Procedure: PORTA CATH INSERTION;  Surgeon: Algernon Huxley, MD;  Location: [place] [facility];  Service: Cardiovascular;  Laterality: N/A;  . TONSILLECTOMY      Social History   Socioeconomic History  . Marital status: Married    Spouse name: Not on file  . Number of children: Not on file  . Years of education: Not on file  . Highest education level: Not on file  Occupational History  . Not on file  Tobacco Use  . Smoking status: Former Smoker    Packs/day: 0.50    Years: 50.00    Pack years: 25.00    Types: Cigarettes    Quit date: 02/05/2019    Years since quitting: 0.8  . Smokeless tobacco: Former Network engineer  . Vaping Use: Never used  Substance and Sexual Activity  . Alcohol use: Not Currently  . Drug use: Not Currently  . Sexual activity: Not Currently  Other Topics Concern  . Not on file  Social History Narrative    ** Merged History Encounter **       Social Determinants of Health   Financial Resource Strain:   . Difficulty of Paying Living Expenses:   Food Insecurity: No Food Insecurity  . Worried About Charity fundraiser in the Last Year: Never true  . Ran Out of Food in the Last Year: Never true  Transportation Needs: No Transportation Needs  . Lack of Transportation (Medical): No  . Lack of Transportation (Non-Medical): No  Physical Activity: Inactive  . Days of Exercise per Week: 0 days  . Minutes of Exercise per Session: 0 min  Stress:   . Feeling of Stress :   Social Connections:   . Frequency of Communication with Friends and Family:   . Frequency of Social Gatherings with Friends and Family:   . Attends Religious Services:   . Active Member of Clubs or Organizations:   . Attends Archivist Meetings:   Marland Kitchen Marital Status:   Intimate Partner Violence: Not At Risk  . Fear of Current or Ex-Partner: No  . Emotionally  Abused: No  . Physically Abused: No  . Sexually Abused: No    Family History  Problem Relation Age of Onset  . Arthritis Mother   . Hypertension Mother   . Cancer Father   . Kidney disease Father   . Alcohol abuse Paternal Aunt   . Depression Maternal Grandfather      Current Outpatient Medications:  .  albuterol (VENTOLIN HFA) 108 (90 Base) MCG/ACT inhaler, Inhale 2 puffs into the lungs every 6 (six) hours as needed for wheezing or shortness of breath., Disp: 18 g, Rfl: 3 .  amLODipine (NORVASC) 5 MG tablet, Take 1 tablet (5 mg total) by mouth daily., Disp: 90 tablet, Rfl: 3 .  antiseptic oral rinse (BIOTENE) LIQD, 15 mLs by Mouth Rinse route as needed for dry mouth., Disp: , Rfl:  .  aspirin EC 81 MG tablet, Take 81 mg by mouth daily. Swallow whole., Disp: , Rfl:  .  Magnesium 500 MG CAPS, Take 1 capsule by mouth in the morning and at bedtime., Disp: , Rfl:  .  Multiple Vitamin (MULTIVITAMIN WITH MINERALS) TABS tablet, Take 1 tablet by mouth daily.,  Disp: , Rfl:  .  pantoprazole (PROTONIX) 40 MG tablet, Take 1 tablet (40 mg total) by mouth daily., Disp: 30 tablet, Rfl: 2 .  cyclobenzaprine (FLEXERIL) 5 MG tablet, Take 2 tablets (10 mg total) by mouth 2 (two) times daily as needed for muscle spasms. (Patient not taking: Reported on 11/30/2019), Disp: 120 tablet, Rfl: 0 .  megestrol (MEGACE ES) 625 MG/5ML suspension, Take 5 mLs (625 mg total) by mouth daily. (Patient not taking: Reported on 11/30/2019), Disp: 150 mL, Rfl: 0 .  OLANZapine (ZYPREXA) 10 MG tablet, Take 1 tablet (10 mg total) by mouth at bedtime., Disp: 30 tablet, Rfl: 1 .  ondansetron (ZOFRAN) 4 MG tablet, Take 1 tablet (4 mg total) by mouth every 8 (eight) hours as needed for up to 10 doses for nausea or vomiting. (Patient not taking: Reported on 11/30/2019), Disp: 20 tablet, Rfl: 0 No current facility-administered medications for this visit.  Facility-Administered Medications Ordered in Other Visits:  .  heparin lock flush 100 unit/mL, 500 Units, Intravenous, Once, Randa Evens C, MD .  sodium chloride flush (NS) 0.9 % injection 10 mL, 10 mL, Intravenous, PRN, Sindy Guadeloupe, MD, 10 mL at 11/30/19 1013  Physical exam:  Vitals:   11/30/19 1020  BP: 114/85  Pulse: 93  Temp: 98 F (36.7 C)  TempSrc: Oral  SpO2: 100%  Weight: 167 lb 1.6 oz (75.8 kg)   Physical Exam Constitutional:      General: He is not in acute distress. Cardiovascular:     Rate and Rhythm: Normal rate and regular rhythm.     Heart sounds: Normal heart sounds.  Pulmonary:     Effort: Pulmonary effort is normal.     Breath sounds: Normal breath sounds.  Abdominal:     General: Bowel sounds are normal.     Palpations: Abdomen is soft.  Skin:    General: Skin is warm and dry.  Neurological:     Mental Status: He is alert and oriented to person, place, and time.      CMP Latest Ref Rng & Units 11/30/2019  Glucose 70 - 99 mg/dL 118(H)  BUN 8 - 23 mg/dL 8  Creatinine 0.61 - 1.24 mg/dL 1.18  Sodium  135 - 145 mmol/L 136  Potassium 3.5 - 5.1 mmol/L 3.8  Chloride 98 - 111 mmol/L 102  CO2 22 - 32 mmol/L 24  Calcium 8.9 - 10.3 mg/dL 8.9  Total Protein 6.5 - 8.1 g/dL 6.7  Total Bilirubin 0.3 - 1.2 mg/dL 0.9  Alkaline Phos 38 - 126 U/L 51  AST 15 - 41 U/L 29  ALT 0 - 44 U/L 20   CBC Latest Ref Rng & Units 11/30/2019  WBC 4.0 - 10.5 K/uL 4.7  Hemoglobin 13.0 - 17.0 g/dL 13.9  Hematocrit 39 - 52 % 39.6  Platelets 150 - 400 K/uL 134(L)    No images are attached to the encounter.  DG Abd 1 View  Result Date: 11/03/2019 CLINICAL DATA:  Metal screening prior to MRI EXAM: ABDOMEN - 1 VIEW COMPARISON:  None. FINDINGS: No metallic foreign bodies identified on included image of the abdomen. The bowel gas pattern is normal. No radio-opaque calculi or other significant radiographic abnormality are seen. IMPRESSION: No metallic foreign bodies identified on included image of the abdomen. Electronically Signed   By: Davina Poke D.O.   On: 11/03/2019 12:26   CT Head Wo Contrast  Result Date: 11/03/2019 CLINICAL DATA:  Fall. EXAM: CT HEAD WITHOUT CONTRAST CT CERVICAL SPINE WITHOUT CONTRAST TECHNIQUE: Multidetector CT imaging of the head and cervical spine was performed following the standard protocol without intravenous contrast. Multiplanar CT image reconstructions of the cervical spine were also generated. COMPARISON:  Same day. FINDINGS: CT HEAD FINDINGS Brain: Mild diffuse cortical atrophy is noted. Mild chronic ischemic white matter disease is noted. No mass effect or midline shift is noted. Ventricular size is within normal limits. There is no evidence of mass lesion, hemorrhage or acute infarction. Vascular: No hyperdense vessel or unexpected calcification. Skull: Normal. Negative for fracture or focal lesion. Sinuses/Orbits: No acute finding. Other: None. CT CERVICAL SPINE FINDINGS Alignment: Mild grade 1 retrolisthesis is noted at C5-6 secondary to severe degenerative disc disease at this level.  Skull base and vertebrae: No acute fracture. No primary bone lesion or focal pathologic process. Soft tissues and spinal canal: No prevertebral fluid or swelling. No visible canal hematoma. Disc levels: Severe degenerative disc disease is noted at C5-6 and C6-7 with anterior osteophyte formation. Upper chest: Negative. Other: None. IMPRESSION: 1. Mild diffuse cortical atrophy. Mild chronic ischemic white matter disease. No acute intracranial abnormality seen. 2. Severe multilevel degenerative disc disease. No acute abnormality seen in the cervical spine. Electronically Signed   By: Marijo Conception M.D.   On: 11/03/2019 12:00   CT Head Wo Contrast  Result Date: 11/03/2019 CLINICAL DATA:  Altered mental status.  Confusion.  Headache. EXAM: CT HEAD WITHOUT CONTRAST TECHNIQUE: Contiguous axial images were obtained from the base of the skull through the vertex without intravenous contrast. COMPARISON:  None. FINDINGS: Brain: No evidence of acute infarction, hemorrhage, hydrocephalus, extra-axial collection or mass lesion/mass effect. Diffuse mild cerebral cortical and cerebellar atrophy. Vascular: No hyperdense vessel or unexpected calcification. Skull: Normal. Negative for fracture or focal lesion. Sinuses/Orbits: Normal. Other: None IMPRESSION: No acute abnormality. Diffuse mild atrophy. Electronically Signed   By: Lorriane Shire M.D.   On: 11/03/2019 07:52   CT Cervical Spine Wo Contrast  Result Date: 11/03/2019 CLINICAL DATA:  Fall. EXAM: CT HEAD WITHOUT CONTRAST CT CERVICAL SPINE WITHOUT CONTRAST TECHNIQUE: Multidetector CT imaging of the head and cervical spine was performed following the standard protocol without intravenous contrast. Multiplanar CT image reconstructions of the cervical spine were also generated. COMPARISON:  Same day. FINDINGS: CT HEAD FINDINGS Brain: Mild diffuse cortical atrophy is noted. Mild chronic ischemic  white matter disease is noted. No mass effect or midline shift is noted.  Ventricular size is within normal limits. There is no evidence of mass lesion, hemorrhage or acute infarction. Vascular: No hyperdense vessel or unexpected calcification. Skull: Normal. Negative for fracture or focal lesion. Sinuses/Orbits: No acute finding. Other: None. CT CERVICAL SPINE FINDINGS Alignment: Mild grade 1 retrolisthesis is noted at C5-6 secondary to severe degenerative disc disease at this level. Skull base and vertebrae: No acute fracture. No primary bone lesion or focal pathologic process. Soft tissues and spinal canal: No prevertebral fluid or swelling. No visible canal hematoma. Disc levels: Severe degenerative disc disease is noted at C5-6 and C6-7 with anterior osteophyte formation. Upper chest: Negative. Other: None. IMPRESSION: 1. Mild diffuse cortical atrophy. Mild chronic ischemic white matter disease. No acute intracranial abnormality seen. 2. Severe multilevel degenerative disc disease. No acute abnormality seen in the cervical spine. Electronically Signed   By: Marijo Conception M.D.   On: 11/03/2019 12:00   MR BRAIN WO CONTRAST  Result Date: 11/03/2019 CLINICAL DATA:  Acute encephalopathy. Altered mental status (AMS), unclear cause. Additional history provided: History of hypertension, hepatitis C, seizure disorder, COPD, alcohol abuse, nasopharyngeal carcinoma. EXAM: MRI HEAD WITHOUT CONTRAST TECHNIQUE: Multiplanar, multiecho pulse sequences of the brain and surrounding structures were obtained without intravenous contrast. COMPARISON:  Head CT 11/03/2019, brain MRI 05/25/2019. FINDINGS: Brain: Multiple sequences are motion degraded, limiting evaluation. Most notably, there is moderate motion degradation of the axial T2/FLAIR sequence, moderate motion degradation of the sagittal T1 weighted sequence and moderate motion degradation of the axial T1 weighted sequence. There is stable, mild generalized parenchymal atrophy. Stable mild patchy T2/FLAIR hyperintensity within the cerebral  white matter which is nonspecific, but consistent with chronic small vessel ischemic disease. There is no acute infarct. No chronic intracranial blood products. No extra-axial fluid collection. No midline shift. There is inadequate reassessment of a known left nasopharyngeal carcinoma on this noncontrast examination. Vascular: Expected proximal arterial flow voids. Skull and upper cervical spine: No focal marrow lesion. Sinuses/Orbits: Visualized orbits show no acute finding. Trace paranasal sinus mucosal thickening, greatest within the left maxillary sinus. Small left mastoid effusion. IMPRESSION: Motion degraded exam as described. Please note there is inadequate reassessment of a known left nasopharyngeal carcinoma on this non-contrast exam. No evidence of acute intracranial abnormality, including acute infarction. Stable mild generalized parenchymal atrophy and chronic small vessel ischemic disease. Small left mastoid effusion. Trace paranasal sinus mucosal thickening. Electronically Signed   By: Kellie Simmering DO   On: 11/03/2019 13:15   DG Chest Portable 1 View  Result Date: 11/03/2019 CLINICAL DATA:  Shortness of breath. Altered mental status. Slurred speech. EXAM: PORTABLE CHEST 1 VIEW COMPARISON:  None. FINDINGS: Power port in place in good position with the tip in the superior vena cava just below the carina. Heart size and pulmonary vascularity are normal. Lungs are clear. No bone abnormality. IMPRESSION: No acute cardiopulmonary disease. Electronically Signed   By: Lorriane Shire M.D.   On: 11/03/2019 07:51     Assessment and plan- Patient is a 69 y.o. male  with history of nasopharyngeal carcinoma stage II T2 N0 M0. He is s/p concurrent chemoradiation with weekly cisplatin chemotherapy.   He is here for routine follow-up  Last PET CT scan in May 2021 showed no residual or recurrent disease.  He also had ENT evaluation by Dr. Pryor Ochoa and was not found to have any concerning findings for  malignancy.  I suspect his ongoing  weight loss is a combination of poor appetite, dry mouth and loose fitting dentures.  I have started him on Zyprexa 10 mg at night to stimulate his appetite and see if it works.  I have also encouraged him to drink at least 1-2 ensures a day.  He was also started on weight-based medication to improve his dry mouth by ENT.  I will assess how he is doing in 2 months time.  If he continues to lose weight I will consider getting another PET scan at that time    Visit Diagnosis 1. Abnormal weight loss   2. Lack of appetite   3. Xerostomia   4. Nasopharyngeal carcinoma ([place])      Dr. Randa Evens, MD, MPH [facility] [phone] 11/30/2019 12:07 PM

## 2019-12-10 ENCOUNTER — Emergency Department: Payer: Medicare HMO

## 2019-12-10 ENCOUNTER — Other Ambulatory Visit: Payer: Self-pay

## 2019-12-10 ENCOUNTER — Encounter: Payer: Self-pay | Admitting: Psychiatry

## 2019-12-10 ENCOUNTER — Observation Stay
Admission: EM | Admit: 2019-12-10 | Discharge: 2019-12-11 | Disposition: A | Discharge status: Home / Self Care | Payer: Medicare HMO | Attending: Internal Medicine | Admitting: Internal Medicine

## 2019-12-10 DIAGNOSIS — J323 Chronic sphenoidal sinusitis: Secondary | ICD-10-CM | POA: Diagnosis not present

## 2019-12-10 DIAGNOSIS — Z7982 Long term (current) use of aspirin: Secondary | ICD-10-CM | POA: Diagnosis not present

## 2019-12-10 DIAGNOSIS — Z20822 Contact with and (suspected) exposure to covid-19: Secondary | ICD-10-CM | POA: Insufficient documentation

## 2019-12-10 DIAGNOSIS — G9341 Metabolic encephalopathy: Principal | ICD-10-CM | POA: Insufficient documentation

## 2019-12-10 DIAGNOSIS — J449 Chronic obstructive pulmonary disease, unspecified: Secondary | ICD-10-CM | POA: Diagnosis not present

## 2019-12-10 DIAGNOSIS — I10 Essential (primary) hypertension: Secondary | ICD-10-CM | POA: Diagnosis not present

## 2019-12-10 DIAGNOSIS — R531 Weakness: Secondary | ICD-10-CM | POA: Insufficient documentation

## 2019-12-10 DIAGNOSIS — D696 Thrombocytopenia, unspecified: Secondary | ICD-10-CM | POA: Diagnosis not present

## 2019-12-10 DIAGNOSIS — I6782 Cerebral ischemia: Secondary | ICD-10-CM | POA: Diagnosis not present

## 2019-12-10 DIAGNOSIS — R404 Transient alteration of awareness: Secondary | ICD-10-CM | POA: Diagnosis not present

## 2019-12-10 DIAGNOSIS — R000 Tachycardia, unspecified: Secondary | ICD-10-CM | POA: Diagnosis not present

## 2019-12-10 DIAGNOSIS — J3489 Other specified disorders of nose and nasal sinuses: Secondary | ICD-10-CM | POA: Diagnosis not present

## 2019-12-10 DIAGNOSIS — R69 Illness, unspecified: Secondary | ICD-10-CM | POA: Diagnosis not present

## 2019-12-10 DIAGNOSIS — R410 Disorientation, unspecified: Secondary | ICD-10-CM | POA: Insufficient documentation

## 2019-12-10 DIAGNOSIS — D649 Anemia, unspecified: Secondary | ICD-10-CM | POA: Insufficient documentation

## 2019-12-10 DIAGNOSIS — F1910 Other psychoactive substance abuse, uncomplicated: Secondary | ICD-10-CM | POA: Diagnosis not present

## 2019-12-10 DIAGNOSIS — K219 Gastro-esophageal reflux disease without esophagitis: Secondary | ICD-10-CM | POA: Diagnosis not present

## 2019-12-10 DIAGNOSIS — G319 Degenerative disease of nervous system, unspecified: Secondary | ICD-10-CM | POA: Diagnosis not present

## 2019-12-10 DIAGNOSIS — Z87891 Personal history of nicotine dependence: Secondary | ICD-10-CM | POA: Diagnosis not present

## 2019-12-10 DIAGNOSIS — Z85818 Personal history of malignant neoplasm of other sites of lip, oral cavity, and pharynx: Secondary | ICD-10-CM | POA: Insufficient documentation

## 2019-12-10 DIAGNOSIS — R4182 Altered mental status, unspecified: Secondary | ICD-10-CM | POA: Diagnosis not present

## 2019-12-10 LAB — URINE DRUG SCREEN, QUALITATIVE (ARMC ONLY)
Amphetamines, Ur Screen: NOT DETECTED
Barbiturates, Ur Screen: NOT DETECTED
Benzodiazepine, Ur Scrn: POSITIVE — AB
Cannabinoid 50 Ng, Ur ~~LOC~~: NOT DETECTED
Cocaine Metabolite,Ur ~~LOC~~: POSITIVE — AB
MDMA (Ecstasy)Ur Screen: NOT DETECTED
Methadone Scn, Ur: NOT DETECTED
Opiate, Ur Screen: NOT DETECTED
Phencyclidine (PCP) Ur S: NOT DETECTED
Tricyclic, Ur Screen: POSITIVE — AB

## 2019-12-10 LAB — COMPREHENSIVE METABOLIC PANEL
ALT: 13 U/L (ref 0–44)
AST: 21 U/L (ref 15–41)
Albumin: 3.5 g/dL (ref 3.5–5.0)
Alkaline Phosphatase: 43 U/L (ref 38–126)
Anion gap: 8 (ref 5–15)
BUN: 17 mg/dL (ref 8–23)
CO2: 26 mmol/L (ref 22–32)
Calcium: 8.3 mg/dL — ABNORMAL LOW (ref 8.9–10.3)
Chloride: 108 mmol/L (ref 98–111)
Creatinine, Ser: 1.12 mg/dL (ref 0.61–1.24)
GFR calc Af Amer: >60 mL/min (ref >60)
GFR calc non Af Amer: >60 mL/min (ref >60)
Glucose, Bld: 108 mg/dL — ABNORMAL HIGH (ref 70–99)
Potassium: 3.9 mmol/L (ref 3.5–5.1)
Sodium: 142 mmol/L (ref 135–145)
Total Bilirubin: 0.8 mg/dL (ref 0.3–1.2)
Total Protein: 6 g/dL — ABNORMAL LOW (ref 6.5–8.1)

## 2019-12-10 LAB — CBC
HCT: 30.9 % — ABNORMAL LOW (ref 39.0–52.0)
Hemoglobin: 10.5 g/dL — ABNORMAL LOW (ref 13.0–17.0)
MCH: 30.9 pg (ref 26.0–34.0)
MCHC: 34 g/dL (ref 30.0–36.0)
MCV: 90.9 fL (ref 80.0–100.0)
Platelets: 104 10*3/uL — ABNORMAL LOW (ref 150–400)
RBC: 3.4 MIL/uL — ABNORMAL LOW (ref 4.22–5.81)
RDW: 15.5 % (ref 11.5–15.5)
WBC: 8.5 10*3/uL (ref 4.0–10.5)
nRBC: 0 % (ref 0.0–0.2)

## 2019-12-10 LAB — URINALYSIS, COMPLETE (UACMP) WITH MICROSCOPIC
Bacteria, UA: NONE SEEN
Bilirubin Urine: NEGATIVE
Glucose, UA: NEGATIVE mg/dL
Hgb urine dipstick: NEGATIVE
Ketones, ur: NEGATIVE mg/dL
Leukocytes,Ua: NEGATIVE
Nitrite: NEGATIVE
Protein, ur: NEGATIVE mg/dL
Specific Gravity, Urine: 1.012 (ref 1.005–1.030)
Squamous Epithelial / HPF: NONE SEEN (ref 0–5)
pH: 6 (ref 5.0–8.0)

## 2019-12-10 LAB — TSH: TSH: 0.769 u[IU]/mL (ref 0.350–4.500)

## 2019-12-10 LAB — ACETAMINOPHEN LEVEL: Acetaminophen (Tylenol), Serum: <10 ug/mL — ABNORMAL LOW (ref 10–30)

## 2019-12-10 LAB — ETHANOL: Alcohol, Ethyl (B): <10 mg/dL (ref <10)

## 2019-12-10 LAB — SARS CORONAVIRUS 2 BY RT PCR (HOSPITAL ORDER, PERFORMED IN ~~LOC~~ HOSPITAL LAB): SARS Coronavirus 2: NEGATIVE

## 2019-12-10 LAB — CK: Total CK: 86 U/L (ref 49–397)

## 2019-12-10 LAB — TROPONIN I (HIGH SENSITIVITY): Troponin I (High Sensitivity): 10 ng/L (ref <18)

## 2019-12-10 LAB — SALICYLATE LEVEL: Salicylate Lvl: <7 mg/dL — ABNORMAL LOW (ref 7.0–30.0)

## 2019-12-10 MED ORDER — ACETAMINOPHEN 325 MG PO TABS: 650.0000 mg | ORAL_TABLET | Freq: Four times a day (QID) | ORAL | Status: DC | PRN

## 2019-12-10 MED ORDER — PANTOPRAZOLE SODIUM 40 MG PO TBEC
40.0000 mg | DELAYED_RELEASE_TABLET | Freq: Every day | ORAL | Status: DC
  Administered 2019-12-11: 10:00:00 40 mg via ORAL
  Filled 2019-12-10: qty 1

## 2019-12-10 MED ORDER — ENOXAPARIN SODIUM 40 MG/0.4ML ~~LOC~~ SOLN: 40.0000 mg | SUBCUTANEOUS | Status: DC

## 2019-12-10 MED ORDER — ONDANSETRON HCL 4 MG/2ML IJ SOLN: 4.0000 mg | Freq: Four times a day (QID) | INTRAMUSCULAR | Status: DC | PRN

## 2019-12-10 MED ORDER — LORAZEPAM 2 MG/ML IJ SOLN: 2.0000 mg | Freq: Once | INTRAMUSCULAR | Status: DC

## 2019-12-10 MED ORDER — SODIUM CHLORIDE 0.9 % IV BOLUS
500.0000 mL | Freq: Once | INTRAVENOUS | Status: AC
  Administered 2019-12-10: 500 mL via INTRAVENOUS

## 2019-12-10 MED ORDER — SODIUM CHLORIDE 0.9 % IV SOLN: INTRAVENOUS | Status: DC

## 2019-12-10 MED ORDER — BISACODYL 5 MG PO TBEC: 5.0000 mg | DELAYED_RELEASE_TABLET | Freq: Every day | ORAL | Status: DC | PRN

## 2019-12-10 MED ORDER — ASPIRIN EC 81 MG PO TBEC
81.0000 mg | DELAYED_RELEASE_TABLET | Freq: Every day | ORAL | Status: DC
  Administered 2019-12-11: 81 mg via ORAL
  Filled 2019-12-10: qty 1

## 2019-12-10 MED ORDER — LEVETIRACETAM IN NACL 1000 MG/100ML IV SOLN
1000.0000 mg | Freq: Once | INTRAVENOUS | Status: AC
  Administered 2019-12-10: 1000 mg via INTRAVENOUS
  Filled 2019-12-10: qty 100

## 2019-12-10 MED ORDER — ACETAMINOPHEN 650 MG RE SUPP: 650.0000 mg | Freq: Four times a day (QID) | RECTAL | Status: DC | PRN

## 2019-12-10 MED ORDER — ONDANSETRON HCL 4 MG PO TABS: 4.0000 mg | ORAL_TABLET | Freq: Four times a day (QID) | ORAL | Status: DC | PRN

## 2019-12-10 NOTE — ED Triage Notes (Signed)
Patient arrived via EMS, per EMS patient was found lying in a yard and has potential heat exposure.  Patient received 2 5ooml bags of fluid with EMS. Patient mumbles when he speaks, he is redirectable. VSS

## 2019-12-10 NOTE — ED Provider Notes (Signed)
[facility] Emergency Department Provider Note ____________________________________________   First MD Initiated Contact with Patient 12/10/19 1558     (approximate)  I have reviewed the triage vital signs and the nursing notes.   HISTORY  Chief Complaint Altered Mental Status  EM caveat: Altered mental status, confusion  HPI [name] is a 69 y.o. male   EMS reports was found laying in the yard.  He received 750 mL of normal saline with EMS for tachycardia.  They report that he has had mumbling speech.  Does follow commands though.  He was notably tachycardic with EMS.  EMS reports an unknown known when last known well.  Patient unable to provide history  Past Medical History:  Diagnosis Date  . Alcohol abuse   . Benzodiazepine dependence ([place])   . Benzodiazepine withdrawal ([place])   . Chronic pain in right foot   . COPD (chronic obstructive pulmonary disease) ([place])   . Depression 08/19/2017  . Depression   . Hepatitis C   . Hypertension   . 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 Seizures ([place]) 2010  . Seizures ([place])   . Sleep apnea   . Syncope    questionable vasovagal  . Syncope     Patient Active Problem List   Diagnosis Date Noted  . Polypharmacy   . Acute metabolic encephalopathy   . AKI (acute kidney injury) ([place])   . Weakness   . Essential hypertension   . 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 Encephalopathy acute 11/03/2019  . Depression, major, single episode, moderate ([place]) 09/11/2019  . Weakness 07/22/2019  . Chemotherapy induced neutropenia ([place]) 07/20/2019  . COPD, mild ([place]) 06/03/2019  . 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 Goals of care, counseling/discussion 05/11/2019  . Non-recurrent acute serous otitis media of left ear 03/22/2019  . Cellulitis and abscess of head 03/02/2019  . Atopic dermatitis 02/14/2019  . Vitamin B12 deficiency 10/24/2018  . Stomatitis and mucositis 10/24/2018  . Low back pain due  to bilateral sciatica 07/20/2018  . Restless leg syndrome 07/20/2018  . Mixed hyperlipidemia 03/03/2018  . Thrush, oral 12/17/2017  . Depression 08/19/2017  . History of hepatitis C 08/19/2017  . Syncope 08/19/2017  . Gastroesophageal reflux disease without esophagitis 08/19/2017  . Encounter for general adult medical examination with abnormal findings 08/06/2017  . Primary insomnia 08/06/2017  . 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 Screening for colon cancer 08/06/2017  . Dysuria 08/06/2017  . Hereditary and idiopathic peripheral neuropathy 01/05/2015  . Complex regional pain syndrome of both lower extremities 01/05/2015  . Neuralgia 01/05/2015  . Seizures ([place]) 11/29/2013  . Benzodiazepine withdrawal ([place]) 11/29/2013  . Seizure disorder ([place]) 11/29/2013  . Alcohol abuse 11/29/2013  . Essential hypertension 11/29/2013  . Hepatitis C 11/29/2013  . Abnormal chest x-ray 11/29/2013  . Obesity 11/29/2013  . Tobacco use disorder 11/29/2013    Past Surgical History:  Procedure Laterality Date  . COLONOSCOPY WITH PROPOFOL N/A 12/16/2016   Procedure: COLONOSCOPY WITH PROPOFOL;  Surgeon: Lollie Sails, MD;  Location: [facility] ENDOSCOPY;  Service: Endoscopy;  Laterality: N/A;  . COLONOSCOPY WITH PROPOFOL N/A 03/14/2017   Procedure: COLONOSCOPY WITH PROPOFOL;  Surgeon: Lollie Sails, MD;  Location: [facility] ENDOSCOPY;  Service: Endoscopy;  Laterality: N/A;  . COLONOSCOPY WITH PROPOFOL    . FOOT SURGERY Right 03/12/2002  . FOOT SURGERY    . HEMORRHOID SURGERY    . LITHOTRIPSY    . MYRINGOTOMY    . MYRINGOTOMY  WITH TUBE PLACEMENT Left 04/28/2019   Procedure: MYRINGOTOMY WITH TUBE PLACEMENT;  Surgeon: Carloyn Manner, MD;  Location: [place];  Service: ENT;  Laterality: Left;  . NASOPHARYNGOSCOPY N/A 04/28/2019   Procedure: NASOPHARYNGOSCOPY WITH BIOPSY;  Surgeon: Carloyn Manner, MD;  Location: [place];  Service: ENT;  Laterality: N/A;  .  NASOPHARYNGOSCOPY    . Port a cath Placement    . PORTA CATH INSERTION N/A 05/20/2019   Procedure: PORTA CATH INSERTION;  Surgeon: Algernon Huxley, MD;  Location: [place] [facility] CV LAB;  Service: Cardiovascular;  Laterality: N/A;  . TONSILLECTOMY      Prior to Admission medications   Medication Sig Start Date End Date Taking? Authorizing Provider  albuterol (VENTOLIN HFA) 108 (90 Base) MCG/ACT inhaler Inhale 2 puffs into the lungs every 6 (six) hours as needed for wheezing or shortness of breath. 08/05/19   Boscia, Greer Ee, NP  amLODipine (NORVASC) 5 MG tablet Take 1 tablet (5 mg total) by mouth daily. 08/30/19   Ronnell Freshwater, NP  antiseptic oral rinse (BIOTENE) LIQD 15 mLs by Mouth Rinse route as needed for dry mouth.    [provider]  aspirin EC 81 MG tablet Take 81 mg by mouth daily. Swallow whole.    [provider]  cyclobenzaprine (FLEXERIL) 5 MG tablet Take 2 tablets (10 mg total) by mouth 2 (two) times daily as needed for muscle spasms. Patient not taking: Reported on 11/30/2019 10/19/19   Ronnell Freshwater, NP  Magnesium 500 MG CAPS Take 1 capsule by mouth in the morning and at bedtime.    [provider]  megestrol (MEGACE ES) 625 MG/5ML suspension Take 5 mLs (625 mg total) by mouth daily. Patient not taking: Reported on 11/30/2019 09/27/19   Sindy Guadeloupe, MD  Multiple Vitamin (MULTIVITAMIN WITH MINERALS) TABS tablet Take 1 tablet by mouth daily.    [provider]  OLANZapine (ZYPREXA) 10 MG tablet Take 1 tablet (10 mg total) by mouth at bedtime. 11/30/19   Sindy Guadeloupe, MD  ondansetron (ZOFRAN) 4 MG tablet Take 1 tablet (4 mg total) by mouth every 8 (eight) hours as needed for up to 10 doses for nausea or vomiting. Patient not taking: Reported on 11/30/2019 04/28/19   Carloyn Manner, MD  pantoprazole (PROTONIX) 40 MG tablet Take 1 tablet (40 mg total) by mouth daily. 10/08/19   Sindy Guadeloupe, MD    Allergies Opana [oxymorphone hcl],  Amoxicillin, Amoxicillin, Oxymorphone, Oxymorphone, Sulfa antibiotics, Sulfa antibiotics, and Sulfur  Family History  Problem Relation Age of Onset  . Arthritis Mother   . Hypertension Mother   . Cancer Father   . Kidney disease Father   . Alcohol abuse Paternal Aunt   . Depression Maternal Grandfather     Social History Social History   Tobacco Use  . Smoking status: Former Smoker    Packs/day: 0.50    Years: 50.00    Pack years: 25.00    Types: Cigarettes    Quit date: 02/05/2019    Years since quitting: 0.8  . Smokeless tobacco: Former Network engineer  . Vaping Use: Never used  Substance Use Topics  . Alcohol use: Not Currently  . Drug use: Not Currently    Review of Systems  EM caveat   ____________________________________________   PHYSICAL EXAM:  VITAL SIGNS: ED Triage Vitals  Enc Vitals Group     BP 12/10/19 1426 (!) 123/91     Pulse Rate 12/10/19  1426 (!) 108     Resp 12/10/19 1426 (!) 24     Temp 12/10/19 1426 98.5 F (36.9 C)     Temp Source 12/10/19 1426 Oral     SpO2 12/10/19 1426 96 %     Weight 12/10/19 1427 167 lb 1.7 oz (75.8 kg)     Height 12/10/19 1427 5\' 9"  (1.753 m)     Head Circumference --      Peak Flow --      Pain Score 12/10/19 1427 0     Pain Loc --      Pain Edu? --      Excl. in [place]? --     Constitutional: Alert difficult to assess orientation, speech is slurred.  Does follow basic commands, but complex tasks such as two-step commands he is difficulty with.  He is occasionally picking up his sheets, lifting them up, and sometimes appears to be picking at things. Eyes: Conjunctivae are normal. Head: Atraumatic. Nose: No congestion/rhinnorhea. Mouth/Throat: Mucous membranes are quite dry. Neck: No stridor.  Cardiovascular: Minimally tachycardic rate, regular rhythm. Grossly normal heart sounds.  Good peripheral circulation. Respiratory: Normal respiratory effort.  No retractions. Lungs CTAB. Gastrointestinal: Soft and  nontender. No distention. Musculoskeletal: No lower extremity tenderness nor edema. Neurologic: Slurring of speech.  Moves all extremities.  No pronator drift in any extremity.  Does not appear to have any focal neurologic deficit.  Exam and a complete NIH are difficult to obtain due to patient's difficulty following some commands. Skin:  Skin is warm, dry and intact. No rash noted except for some small abrasions over his left inner ankle right lower leg which appears to be somewhat chronic, and also has old abrasions overlying the right lower leg as well. Psychiatric: Mood and affect are mostly calm, sometimes picking at things, somewhat confused.  ____________________________________________   LABS (all labs ordered are listed, but only abnormal results are displayed)  Labs Reviewed  CBC - Abnormal; Notable for the following components:      Result Value   RBC 3.40 (*)    Hemoglobin 10.5 (*)    HCT 30.9 (*)    Platelets 104 (*)    All other components within normal limits  COMPREHENSIVE METABOLIC PANEL - Abnormal; Notable for the following components:   Glucose, Bld 108 (*)    Calcium 8.3 (*)    Total Protein 6.0 (*)    All other components within normal limits  SARS CORONAVIRUS 2 BY RT PCR (HOSPITAL ORDER, [facility])  CK  ETHANOL  URINALYSIS, COMPLETE (UACMP) WITH MICROSCOPIC  URINE DRUG SCREEN, QUALITATIVE ([facility] ONLY)  TROPONIN I (HIGH SENSITIVITY)   ____________________________________________  EKG  Reviewed interpreted by me at 1445 Heart rate 100 QRS 80 QTc 460 Sinus tachycardia, no evidence of acute ischemia ____________________________________________  RADIOLOGY  CT Head Wo Contrast  Result Date: 12/10/2019 CLINICAL DATA:  Delirium. Additional provided: Patient found down in yard, potential heat exposure. EXAM: CT HEAD WITHOUT CONTRAST TECHNIQUE: Contiguous axial images were obtained from the base of the skull through the vertex  without intravenous contrast. COMPARISON:  Brain MRI 05/25/2019, head CT 04/07/2017. FINDINGS: Brain: Stable, mild generalized parenchymal atrophy. Stable, mild ill-defined hypoattenuation within the cerebral white matter which is nonspecific, but consistent with chronic small vessel ischemic disease. There is no acute intracranial hemorrhage. No demarcated cortical infarct is identified. No extra-axial fluid collection. No evidence of intracranial mass. No midline shift. Vascular: No hyperdense vessel. Skull: Normal. Negative  for fracture or focal lesion. Sinuses/Orbits: Visualized orbits show no acute finding. Mild ethmoid, sphenoid and maxillary sinus mucosal thickening at the imaged levels. Trace fluid within left mastoid air cells. Other: Please note there is inadequate reassessment of a previously demonstrated left nasopharyngeal mass on this noncontrast examination. IMPRESSION: No CT evidence of acute intracranial abnormality. Stable mild generalized parenchymal atrophy and chronic small vessel ischemic disease. Mild ethmoid, sphenoid and maxillary sinus mucosal thickening at the imaged levels. Trace left mastoid effusion. Please note there is inadequate reassessment of a previously demonstrated left nasopharyngeal mass on this noncontrast examination. Electronically Signed   By: Kellie Simmering DO   On: 12/10/2019 15:32     ____________________________________________   PROCEDURES  Procedure(s) performed: None  Procedures  Critical Care performed: No  ____________________________________________   INITIAL IMPRESSION / ASSESSMENT AND PLAN / ED COURSE  Pertinent labs & imaging results that were available during my care of the patient were reviewed by me and considered in my medical decision making (see chart for details).   Patient presents for altered mental status, found in a yard.  He does have a history of in the past of polysubstance abuse, prescription abuse, but today etiology of his  presentation is unclear.  He does have slurred speech but does not appear to have any obvious focal findings on exam other than his slurring.  He is alert, follows only basic commands.  Peers to have a picture of encephalopathy, unclear etiology.  There is no noted last known well, no witness to provide a obvious timeframe as to when symptoms started.  He was found outside tachycardic, is not hyperthermic and heart rate has responded well to fluid bolus.  Previously admitted with AKI, his lab work appears reassuring today.  Awaiting drug screen, teleneurology consultation requested for further recommendations.  Also does have mild abrasions, but no noted evidence support major trauma and no clinical history to suggest significant trauma has occurred  CT the head reviewed negative for acute intracranial finding  ----------------------------------------- 4:21 PM on 12/10/2019 -----------------------------------------  Ongoing care assigned to Dr. Gardenia Phlegm, follow-up on recommendations of teleneurology team and reassessments.      ____________________________________________   FINAL CLINICAL IMPRESSION(S) / ED DIAGNOSES  Final diagnoses:  Delirium        Note:  This document was prepared using Dragon voice recognition software and may include unintentional dictation errors       Delman Kitten, MD 12/10/19 1622

## 2019-12-10 NOTE — ED Notes (Signed)
Patient has been restless in bed, and says he wants to go home, he appears to be agitated but is redirectable

## 2019-12-10 NOTE — H&P (Addendum)
History and Physical    [name] ERX:[id number] DOB: 1951/01/07 DOA: 12/10/2019  PCP: Patient, No Pcp Per  Patient coming from:  Laying in the yard   I have personally briefly reviewed patient's old medical records in [place]  Chief Complaint:  Altered mental status  HPI: 69 y/o M w/ PMH of HTN, GERD, nasopharyngeal carcinoma who presents after being found laying in a yard. Pt is a very poor historian. Pt presents w/ altered mental status. Pt is oriented to person, place, year but not month or situation. Pt is unsure of why and how he came to the hospital. Of note, pt was found to have a drug screen positive for cocaine, benzos & TCAs in the ER. Alcohol level was <10. Pt denies any cocaine use. Pt lives alone and still drives. Pt denies any fever, chills, sweating, cough, chest pain, shortness of breath, nausea, vomiting, abd pain, dysuria, urinary urgency, urinary frequency, diarrhea or constipation.   Review of Systems: As per HPI otherwise 10 point review of systems negative.    Past Medical History:  Diagnosis Date  . Alcohol abuse   . Benzodiazepine dependence ([place])   . Benzodiazepine withdrawal ([place])   . Chronic pain in right foot   . COPD (chronic obstructive pulmonary disease) ([place])   . Depression 08/19/2017  . Depression   . Hepatitis C   . Hypertension   . 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[place])   . Seizures ([place]) 2010  . Seizures ([place])   . Sleep apnea   . Syncope    questionable vasovagal  . Syncope     Past Surgical History:  Procedure Laterality Date  . COLONOSCOPY WITH PROPOFOL N/A 12/16/2016   Procedure: COLONOSCOPY WITH PROPOFOL;  Surgeon: Lollie Sails, MD;  Location[facility] ENDOSCOPY;  Service: Endoscopy;  Laterality: N/A;  . COLONOSCOPY WITH PROPOFOL N/A 03/14/2017   Procedure: COLONOSCOPY WITH PROPOFOL;  Surgeon: Lollie Sails, MD;  Location: [facility] ENDOSCOPY;  Service: Endoscopy;  Laterality: N/A;  . COLONOSCOPY WITH PROPOFOL    . FOOT  SURGERY Right 03/12/2002  . FOOT SURGERY    . HEMORRHOID SURGERY    . LITHOTRIPSY    . MYRINGOTOMY    . MYRINGOTOMY WITH TUBE PLACEMENT Left 04/28/2019   Procedure: MYRINGOTOMY WITH TUBE PLACEMENT;  Surgeon: Carloyn Manner, MD;  Location: [place];  Service: ENT;  Laterality: Left;  . NASOPHARYNGOSCOPY N/A 04/28/2019   Procedure: NASOPHARYNGOSCOPY WITH BIOPSY;  Surgeon: Carloyn Manner, MD;  Location: [place];  Service: ENT;  Laterality: N/A;  . NASOPHARYNGOSCOPY    . Port a cath Placement    . PORTA CATH INSERTION N/A 05/20/2019   Procedure: PORTA CATH INSERTION;  Surgeon: Algernon Huxley, MD;  Location: [place] CV LAB;  Service: Cardiovascular;  Laterality: N/A;  . TONSILLECTOMY       reports that he quit smoking about 10 months ago. His smoking use included cigarettes. He has a 25.00 pack-year smoking history. He has quit using smokeless tobacco. He reports previous alcohol use. He reports previous drug use.  Allergies  Allergen Reactions  . Opana [Oxymorphone Hcl]     Made him BLACKOUT  . Amoxicillin Other (See Comments)  . Amoxicillin   . Oxymorphone Other (See Comments)  . Oxymorphone   . Sulfa Antibiotics Other (See Comments)  . Sulfa Antibiotics   . Sulfur     Childhood reaction     Family History  Problem Relation Age of Onset  . Arthritis  Mother   . Hypertension Mother   . Cancer Father   . Kidney disease Father   . Alcohol abuse Paternal Aunt   . Depression Maternal Grandfather      Prior to Admission medications   Medication Sig Start Date End Date Taking? Authorizing Provider  albuterol (VENTOLIN HFA) 108 (90 Base) MCG/ACT inhaler Inhale 2 puffs into the lungs every 6 (six) hours as needed for wheezing or shortness of breath. 08/05/19   Boscia, Greer Ee, NP  amLODipine (NORVASC) 5 MG tablet Take 1 tablet (5 mg total) by mouth daily. 08/30/19   Ronnell Freshwater, NP  antiseptic oral rinse (BIOTENE) LIQD 15 mLs by Mouth Rinse route  as needed for dry mouth.    [provider]  aspirin EC 81 MG tablet Take 81 mg by mouth daily. Swallow whole.    [provider]  cyclobenzaprine (FLEXERIL) 5 MG tablet Take 2 tablets (10 mg total) by mouth 2 (two) times daily as needed for muscle spasms. Patient not taking: Reported on 11/30/2019 10/19/19   Ronnell Freshwater, NP  Magnesium 500 MG CAPS Take 1 capsule by mouth in the morning and at bedtime.    [provider]  megestrol (MEGACE ES) 625 MG/5ML suspension Take 5 mLs (625 mg total) by mouth daily. Patient not taking: Reported on 11/30/2019 09/27/19   Sindy Guadeloupe, MD  Multiple Vitamin (MULTIVITAMIN WITH MINERALS) TABS tablet Take 1 tablet by mouth daily.    [provider]  OLANZapine (ZYPREXA) 10 MG tablet Take 1 tablet (10 mg total) by mouth at bedtime. 11/30/19   Sindy Guadeloupe, MD  ondansetron (ZOFRAN) 4 MG tablet Take 1 tablet (4 mg total) by mouth every 8 (eight) hours as needed for up to 10 doses for nausea or vomiting. Patient not taking: Reported on 11/30/2019 04/28/19   Carloyn Manner, MD  pantoprazole (PROTONIX) 40 MG tablet Take 1 tablet (40 mg total) by mouth daily. 10/08/19   Sindy Guadeloupe, MD    Physical Exam: Vitals:   12/10/19 1426 12/10/19 1427  BP: (!) 123/91   Pulse: (!) 108   Resp: (!) 24   Temp: 98.5 F (36.9 C)   TempSrc: Oral   SpO2: 96%   Weight:  75.8 kg  Height:  5\' 9"  (1.753 m)    Constitutional: NAD, calm, comfortable Vitals:   12/10/19 1426 12/10/19 1427  BP: (!) 123/91   Pulse: (!) 108   Resp: (!) 24   Temp: 98.5 F (36.9 C)   TempSrc: Oral   SpO2: 96%   Weight:  75.8 kg  Height:  5\' 9"  (1.753 m)   Eyes: PERRL, lids and conjunctivae normal ENMT: Mucous membranes are moist. Missing teeth  Neck: normal, supple,  Respiratory: diminished breath sounds b/l. No wheezes, rales Cardiovascular: S1/S2+. No rubs / gallops. No extremity edema.  Abdomen: soft, no tenderness, non-distended.. Bowel sounds  positive.  Musculoskeletal: no clubbing / cyanosis. No joint deformity upper and lower extremities. Decreased strength of b/l LE   Skin: no rashes, lesions, ulcers. Abrasions on b/l LE Neurologic: CN 2-12 grossly intact. Moves all 4 extremities Psychiatric: Abnormal judgment and insight. Oriented to person, place, year only.     Labs on Admission: I have personally reviewed following labs and imaging studies  CBC: Recent Labs  Lab 12/10/19 1434  WBC 8.5  HGB 10.5*  HCT 30.9*  MCV 90.9  PLT 151*   Basic Metabolic Panel: Recent Labs  Lab 12/10/19 1434  NA 142  K 3.9  CL 108  CO2 26  GLUCOSE 108*  BUN 17  CREATININE 1.12  CALCIUM 8.3*   GFR: Estimated Creatinine Clearance: 64 mL/min (by C-G formula based on SCr of 1.12 mg/dL). Liver Function Tests: Recent Labs  Lab 12/10/19 1434  AST 21  ALT 13  ALKPHOS 43  BILITOT 0.8  PROT 6.0*  ALBUMIN 3.5   No results for input(s): LIPASE, AMYLASE in the last 168 hours. No results for input(s): AMMONIA in the last 168 hours. Coagulation Profile: No results for input(s): INR, PROTIME in the last 168 hours. Cardiac Enzymes: Recent Labs  Lab 12/10/19 1434  CKTOTAL 86   BNP (last 3 results) No results for input(s): PROBNP in the last 8760 hours. HbA1C: No results for input(s): HGBA1C in the last 72 hours. CBG: No results for input(s): GLUCAP in the last 168 hours. Lipid Profile: No results for input(s): CHOL, HDL, LDLCALC, TRIG, CHOLHDL, LDLDIRECT in the last 72 hours. Thyroid Function Tests: Recent Labs    12/10/19 1627  TSH 0.769   Anemia Panel: No results for input(s): VITAMINB12, FOLATE, FERRITIN, TIBC, IRON, RETICCTPCT in the last 72 hours. Urine analysis:    Component Value Date/Time   COLORURINE YELLOW (A) 12/10/2019 1555   APPEARANCEUR CLEAR (A) 12/10/2019 1555   APPEARANCEUR Clear 07/20/2018 0840   LABSPEC 1.012 12/10/2019 1555   LABSPEC 1.002 12/17/2013 1755   PHURINE 6.0 12/10/2019 1555    GLUCOSEU NEGATIVE 12/10/2019 1555   GLUCOSEU Negative 12/17/2013 1755   HGBUR NEGATIVE 12/10/2019 1555   BILIRUBINUR NEGATIVE 12/10/2019 1555   BILIRUBINUR Negative 07/20/2018 0840   BILIRUBINUR Negative 12/17/2013 1755   KETONESUR NEGATIVE 12/10/2019 1555   PROTEINUR NEGATIVE 12/10/2019 1555   UROBILINOGEN 2.0 (H) 11/29/2013 1404   NITRITE NEGATIVE 12/10/2019 1555   LEUKOCYTESUR NEGATIVE 12/10/2019 1555   LEUKOCYTESUR Negative 12/17/2013 1755    Radiological Exams on Admission: DG Chest 2 View  Result Date: 12/10/2019 CLINICAL DATA:  Altered mental status EXAM: CHEST - 2 VIEW COMPARISON:  11/03/2019, PET CT 09/21/2019 FINDINGS: Right-sided central venous port tip over the SVC. No acute consolidation or effusion. Stable cardiomediastinal silhouette with aortic atherosclerosis. No pneumothorax. IMPRESSION: No active cardiopulmonary disease. Electronically Signed   By: Donavan Foil M.D.   On: 12/10/2019 16:57   CT Head Wo Contrast  Result Date: 12/10/2019 CLINICAL DATA:  Delirium. Additional provided: Patient found down in yard, potential heat exposure. EXAM: CT HEAD WITHOUT CONTRAST TECHNIQUE: Contiguous axial images were obtained from the base of the skull through the vertex without intravenous contrast. COMPARISON:  Brain MRI 05/25/2019, head CT 04/07/2017. FINDINGS: Brain: Stable, mild generalized parenchymal atrophy. Stable, mild ill-defined hypoattenuation within the cerebral white matter which is nonspecific, but consistent with chronic small vessel ischemic disease. There is no acute intracranial hemorrhage. No demarcated cortical infarct is identified. No extra-axial fluid collection. No evidence of intracranial mass. No midline shift. Vascular: No hyperdense vessel. Skull: Normal. Negative for fracture or focal lesion. Sinuses/Orbits: Visualized orbits show no acute finding. Mild ethmoid, sphenoid and maxillary sinus mucosal thickening at the imaged levels. Trace fluid within left  mastoid air cells. Other: Please note there is inadequate reassessment of a previously demonstrated left nasopharyngeal mass on this noncontrast examination. IMPRESSION: No CT evidence of acute intracranial abnormality. Stable mild generalized parenchymal atrophy and chronic small vessel ischemic disease. Mild ethmoid, sphenoid and maxillary sinus mucosal thickening at the imaged levels. Trace left mastoid effusion. Please note there is inadequate reassessment of a  previously demonstrated left nasopharyngeal mass on this noncontrast examination. Electronically Signed   By: Kellie Simmering DO   On: 12/10/2019 15:32    EKG: Independently reviewed.   Assessment/Plan Active Problems:   Acute metabolic encephalopathy Acute metabolic encephalopathy: likely secondary illicit drug abuse. Urine drug screen was positive for cocaine, benzos & TCAs. Re-orient prn. CT head neg for any acute intracranial abnormalities.   Illicit drug use: urine screen was positive cocaine. Pt denies cocaine use. Will provide cessation counseling when more oriented and alert   Possible seizure disorder: evidently was suppose to be taking keppra at home but not on pt's med rec. Will consult pharmacy. Keppra level ordered. Will continue on keppra if home dose can be confirmed   HTN: will hold home dose of amlodipine as BP is WNL  GERD: will continue on PPI   Nasopharyngeal carcinoma: management per Dr. Bryna Colander as an outpatient   Thrombocytopenia: etiology unclear, possibly secondary to chemo. Will continue to monitor   Normocytic anemia: etiology unclear, possibly secondary to chemo. No need for a transfusion at this time. Will continue to monitor   Generalized weakness: PT/OT consulted  DVT prophylaxis: lovenox Code Status:  Full  Family Communication:  Disposition Plan:  Depends on PT/OT recs Consults called: none Admission status:  Observation    Wyvonnia Dusky MD [facility] Pager [phone]   If 7PM-7AM,  please contact night-coverage [email or web address]   12/10/2019, 5:36 PM

## 2019-12-10 NOTE — ED Notes (Signed)
Spoke with Raquel Sarna for a neuro consult, she stated she will put in the consult and the neurologist will call Dr Jacqualine Code

## 2019-12-10 NOTE — Consult Note (Signed)
[facility] TeleNeurology Consult Services  Stat Consult  Date of Service:   12/10/2019 15:34:16  Impression:     .  R56.9 - Seizures  CT HEAD:  CLINICAL DATA: Delirium. Additional provided: Patient found down in yard, potential heat exposure.   EXAM: CT HEAD WITHOUT CONTRAST   TECHNIQUE: Contiguous axial images were obtained from the base of the skull through the vertex without intravenous contrast.   COMPARISON: Brain MRI 05/25/2019, head CT 04/07/2017.   FINDINGS: Brain:   Stable, mild generalized parenchymal atrophy.   Stable, mild ill-defined hypoattenuation within the cerebral white matter which is nonspecific, but consistent with chronic small vessel ischemic disease.   There is no acute intracranial hemorrhage.   No demarcated cortical infarct is identified.   No extra-axial fluid collection.   No evidence of intracranial mass.   No midline shift.   Vascular: No hyperdense vessel.   Skull: Normal. Negative for fracture or focal lesion.   Sinuses/Orbits: Visualized orbits show no acute finding. Mild ethmoid, sphenoid and maxillary sinus mucosal thickening at the imaged levels. Trace fluid within left mastoid air cells.   Other: Please note there is inadequate reassessment of a previously demonstrated left nasopharyngeal mass on this noncontrast examination.   IMPRESSION: No CT evidence of acute intracranial abnormality.   Stable mild generalized parenchymal atrophy and chronic small vessel ischemic disease.   Mild ethmoid, sphenoid and maxillary sinus mucosal thickening at the imaged levels.   Trace left mastoid effusion.   Please note there is inadequate reassessment of a previously demonstrated left nasopharyngeal mass on this noncontrast examination.     Electronically Signed By: Kellie Simmering DO On: 12/10/2019 15:32  Metrics: [facility] Notification Time: 12/10/2019 15:32:19 Stamp Time: 12/10/2019 15:34:16 Callback  Response Time: 12/10/2019 15:42:00  Our recommendations are outlined below.  Recommendations:     .  Load With Keppra 1000 mg Now     .  Start Keppra 1000 mg BID   Disposition: Neurology Follow Up Recommended  Sign Out:     .  Discussed with Emergency Department Provider  ----------------------------------------------------------------------------------------------------  Chief Complaint: found down  History of Present Illness: Patient is a 69 year old Male.  69 year old male with history of hypertension, hepatitis C, seizures, alcohol abuse who was found down in his yard today. he had an eepisode where he was found to be a confusiional state at the end of June and brought to the hosspital, per the discharge summary that episode was attributed to polypharmacy and he was asked to discontinuee gabapentin and reduce Xanax use. review of the medical record indicates that he developed seizuures somewhere around 10 years ago. A neurology note indicates that some of the seizures have been attributed to benzodiazepine withdrawal he evidently has an outpatient neurologist although I do not have those actual records he reports that he feels fine at this time. His U ttox is positive for cocaine which he denies taking. When I point out he has slurred speech he thinks that is due to something with his teeth. He denies taking any extra alcohol or recreeational drugs. He has not been taking his Keppra   Past Medical History:     . Hypertension     . Hyperlipidemia      Examination: BP(123/91), Pulse(108), Blood Glucose(108) 1A: Level of Consciousness - Alert; keenly responsive + 0 1B: Ask Month and Age - Both Questions Right + 0 1C: Blink Eyes & Squeeze Hands - Performs Both Tasks + 0 2: Test Horizontal Extraocular Movements -  Normal + 0 3: Test Visual Fields - No Visual Loss + 0 4: Test Facial Palsy (Use Grimace if Obtunded) - Normal symmetry + 0 5A: Test Left Arm Motor Drift - No Drift  for 10 Seconds + 0 5B: Test Right Arm Motor Drift - No Drift for 10 Seconds + 0 6A: Test Left Leg Motor Drift - No Drift for 5 Seconds + 0 6B: Test Right Leg Motor Drift - No Drift for 5 Seconds + 0 7: Test Limb Ataxia (FNF/Heel-Shin) - No Ataxia + 0 8: Test Sensation - Normal; No sensory loss + 0 9: Test Language/Aphasia - Normal; No aphasia + 0 10: Test Dysarthria - Normal + 0 11: Test Extinction/Inattention - No abnormality + 0  NIHSS Score: 0    Patient is being evaluated for possible acute neurologic impairment and high probability of imminent or life-threatening deterioration. I spent total of 20 minutes providing care to this patient, including time for face to face visit via telemedicine, review of medical records, imaging studies and discussion of findings with providers, the patient and/or family.   Dr Janean Sark   [facility] [phone]  Case [id number]

## 2019-12-11 DIAGNOSIS — G9341 Metabolic encephalopathy: Secondary | ICD-10-CM

## 2019-12-11 DIAGNOSIS — R410 Disorientation, unspecified: Secondary | ICD-10-CM | POA: Diagnosis not present

## 2019-12-11 LAB — CBC
HCT: 33.7 % — ABNORMAL LOW (ref 39.0–52.0)
Hemoglobin: 11.2 g/dL — ABNORMAL LOW (ref 13.0–17.0)
MCH: 31.4 pg (ref 26.0–34.0)
MCHC: 33.2 g/dL (ref 30.0–36.0)
MCV: 94.4 fL (ref 80.0–100.0)
Platelets: 94 10*3/uL — ABNORMAL LOW (ref 150–400)
RBC: 3.57 MIL/uL — ABNORMAL LOW (ref 4.22–5.81)
RDW: 15.6 % — ABNORMAL HIGH (ref 11.5–15.5)
WBC: 5.6 10*3/uL (ref 4.0–10.5)
nRBC: 0 % (ref 0.0–0.2)

## 2019-12-11 LAB — COMPREHENSIVE METABOLIC PANEL
ALT: 12 U/L (ref 0–44)
AST: 18 U/L (ref 15–41)
Albumin: 3.4 g/dL — ABNORMAL LOW (ref 3.5–5.0)
Alkaline Phosphatase: 43 U/L (ref 38–126)
Anion gap: 6 (ref 5–15)
BUN: 12 mg/dL (ref 8–23)
CO2: 26 mmol/L (ref 22–32)
Calcium: 8.3 mg/dL — ABNORMAL LOW (ref 8.9–10.3)
Chloride: 107 mmol/L (ref 98–111)
Creatinine, Ser: 0.91 mg/dL (ref 0.61–1.24)
GFR calc Af Amer: >60 mL/min (ref >60)
GFR calc non Af Amer: >60 mL/min (ref >60)
Glucose, Bld: 115 mg/dL — ABNORMAL HIGH (ref 70–99)
Potassium: 4 mmol/L (ref 3.5–5.1)
Sodium: 139 mmol/L (ref 135–145)
Total Bilirubin: 1 mg/dL (ref 0.3–1.2)
Total Protein: 5.9 g/dL — ABNORMAL LOW (ref 6.5–8.1)

## 2019-12-11 LAB — MAGNESIUM: Magnesium: 2.1 mg/dL (ref 1.7–2.4)

## 2019-12-11 NOTE — Discharge Summary (Signed)
Physician Discharge Summary  [name] LNL:[id number] DOB: 02/07/1951 DOA: 12/10/2019  PCP: Patient, No Pcp Per  Admit date: 12/10/2019 Discharge date: 12/11/2019  Admitted From:  Home Disposition:  Home  Recommendations for Outpatient Follow-up:  1. Follow up with PCP in 1-2 weeks 2. Please obtain BMP/CBC in one week 3. Please follow up on the following pending results:None  Home Health:No Equipment/Devices:None Discharge Condition: Fair CODE STATUS: Full Diet recommendation: Heart Healthy   Brief/Interim Summary: 69 y/o M w/ PMH of HTN, GERD, nasopharyngeal carcinoma who presents after being found laying in a yard.  Patient presented with some altered mental status.  UDS was positive for cocaine, benzos and TCAs.  Rest of the toxic work-up was negative which includes alcohol, acetaminophen and salicylates level.  Patient denies any drug abuse but states that he did snort and does not think that that have any street drugs.  Next morning he was appears to be at baseline.  Alert and oriented.  Dysarthria which seems chronic secondary to no teeth and nasopharyngeal cancer.  He was able to eat and drink. There was some concern of about any seizure disorder as patient told ER staffing that he takes [place].  Unable to verified, there was no Keppra listed in his medical record.  Keppra was never ordered or issued by any pharmacy on investigation.  Patient had chronic thrombocytopenia.  He follow-up with hematology and oncology for his nasopharyngeal carcinoma.  He will continue with his home medications and follow-up with his PCP and oncologist.   Discharge Diagnoses:  Active Problems:   Acute metabolic encephalopathy   Delirium   Discharge Instructions  Discharge Instructions    Diet - low sodium heart healthy   Complete by: As directed    Discharge instructions   Complete by: As directed    It was pleasure taking care of you. As we discussed it is very important that  you stay away from all the street drugs as it can worsen your cancer and make you vulnerable for sudden death. Follow-up with your primary care physician and oncologist.   Increase activity slowly   Complete by: As directed      Allergies as of 12/11/2019      Reactions   Opana [oxymorphone Hcl]    Made him BLACKOUT   Amoxicillin Other (See Comments)   Amoxicillin    Oxymorphone Other (See Comments)   Oxymorphone    Sulfa Antibiotics Other (See Comments)   Sulfa Antibiotics    Sulfur    Childhood reaction       Medication List    TAKE these medications   albuterol 108 (90 Base) MCG/ACT inhaler Commonly known as: VENTOLIN HFA Inhale 2 puffs into the lungs every 6 (six) hours as needed for wheezing or shortness of breath.   amLODipine 5 MG tablet Commonly known as: NORVASC Take 1 tablet (5 mg total) by mouth daily.   antiseptic oral rinse Liqd 15 mLs by Mouth Rinse route as needed for dry mouth.   aspirin EC 81 MG tablet Take 81 mg by mouth daily. Swallow whole.   Magnesium 500 MG Caps Take 500 mg by mouth in the morning and at bedtime.   multivitamin with minerals Tabs tablet Take 1 tablet by mouth daily.   OLANZapine 10 MG tablet Commonly known as: ZYPREXA Take 1 tablet (10 mg total) by mouth at bedtime.   omeprazole 20 MG capsule Commonly known as: PRILOSEC Take 20 mg by mouth daily.  Allergies  Allergen Reactions  . Opana [Oxymorphone Hcl]     Made him BLACKOUT  . Amoxicillin Other (See Comments)  . Amoxicillin   . Oxymorphone Other (See Comments)  . Oxymorphone   . Sulfa Antibiotics Other (See Comments)  . Sulfa Antibiotics   . Sulfur     Childhood reaction     Consultations:  None  Procedures/Studies: DG Chest 2 View  Result Date: 12/10/2019 CLINICAL DATA:  Altered mental status EXAM: CHEST - 2 VIEW COMPARISON:  11/03/2019, PET CT 09/21/2019 FINDINGS: Right-sided central venous port tip over the SVC. No acute consolidation or  effusion. Stable cardiomediastinal silhouette with aortic atherosclerosis. No pneumothorax. IMPRESSION: No active cardiopulmonary disease. Electronically Signed   By: Donavan Foil M.D.   On: 12/10/2019 16:57   CT Head Wo Contrast  Result Date: 12/10/2019 CLINICAL DATA:  Delirium. Additional provided: Patient found down in yard, potential heat exposure. EXAM: CT HEAD WITHOUT CONTRAST TECHNIQUE: Contiguous axial images were obtained from the base of the skull through the vertex without intravenous contrast. COMPARISON:  Brain MRI 05/25/2019, head CT 04/07/2017. FINDINGS: Brain: Stable, mild generalized parenchymal atrophy. Stable, mild ill-defined hypoattenuation within the cerebral white matter which is nonspecific, but consistent with chronic small vessel ischemic disease. There is no acute intracranial hemorrhage. No demarcated cortical infarct is identified. No extra-axial fluid collection. No evidence of intracranial mass. No midline shift. Vascular: No hyperdense vessel. Skull: Normal. Negative for fracture or focal lesion. Sinuses/Orbits: Visualized orbits show no acute finding. Mild ethmoid, sphenoid and maxillary sinus mucosal thickening at the imaged levels. Trace fluid within left mastoid air cells. Other: Please note there is inadequate reassessment of a previously demonstrated left nasopharyngeal mass on this noncontrast examination. IMPRESSION: No CT evidence of acute intracranial abnormality. Stable mild generalized parenchymal atrophy and chronic small vessel ischemic disease. Mild ethmoid, sphenoid and maxillary sinus mucosal thickening at the imaged levels. Trace left mastoid effusion. Please note there is inadequate reassessment of a previously demonstrated left nasopharyngeal mass on this noncontrast examination. Electronically Signed   By: Kellie Simmering DO   On: 12/10/2019 15:32     Subjective: Patient has no new complaint today.  He appears back to his baseline.  Had dysarthria which is  chronic, no teeth and history of nasopharyngeal cancer.  Denies any drug use although positive UDS.  Discharge Exam: Vitals:   12/11/19 0807 12/11/19 1228  BP: (!) 126/86 124/69  Pulse: 87 77  Resp: 20 22  Temp: 97.9 F (36.6 C) 97.8 F (36.6 C)  SpO2: 98% 99%   Vitals:   12/11/19 0004 12/11/19 0351 12/11/19 0807 12/11/19 1228  BP: 125/69 124/78 (!) 126/86 124/69  Pulse: 97 91 87 77  Resp: 16 17 20 22   Temp: 98 F (36.7 C) 98.1 F (36.7 C) 97.9 F (36.6 C) 97.8 F (36.6 C)  TempSrc:    Oral  SpO2: 94% 98% 98% 99%  Weight:      Height:        General: Pt is alert, awake, not in acute distress, dysarthria. Cardiovascular: RRR, S1/S2 +, no rubs, no gallops Respiratory: CTA bilaterally, no wheezing, no rhonchi Abdominal: Soft, NT, ND, bowel sounds + Extremities: no edema, no cyanosis   The results of significant diagnostics from this hospitalization (including imaging, microbiology, ancillary and laboratory) are listed below for reference.    Microbiology: Recent Results (from the past 240 hour(s))  SARS Coronavirus 2 by RT PCR (hospital order, performed in [facility] hospital lab) Nasopharyngeal  Nasopharyngeal Swab     Status: None   Collection Time: 12/10/19  7:22 PM   Specimen: Nasopharyngeal Swab  Result Value Ref Range Status   SARS Coronavirus 2 NEGATIVE NEGATIVE Final    Comment: (NOTE) SARS-CoV-2 target nucleic acids are NOT DETECTED.  The SARS-CoV-2 RNA is generally detectable in upper and lower respiratory specimens during the acute phase of infection. The lowest concentration of SARS-CoV-2 viral copies this assay can detect is 250 copies / mL. A negative result does not preclude SARS-CoV-2 infection and should not be used as the sole basis for treatment or other patient management decisions.  A negative result may occur with improper specimen collection / handling, submission of specimen other than nasopharyngeal swab, presence of viral mutation(s)  within the areas targeted by this assay, and inadequate number of viral copies (<250 copies / mL). A negative result must be combined with clinical observations, patient history, and epidemiological information.  Fact Sheet for Patients:   StrictlyIdeas.no  Fact Sheet for Healthcare Providers: BankingDealers.co.za  This test is not yet approved or  cleared by the [place] FDA and has been authorized for detection and/or diagnosis of SARS-CoV-2 by FDA under an Emergency Use Authorization (EUA).  This EUA will remain in effect (meaning this test can be used) for the duration of the COVID-19 declaration under Section 564(b)(1) of the Act, 21 U.S.C. section 360bbb-3(b)(1), unless the authorization is terminated or revoked sooner.  Performed at [facility], [address] [place], [place] [address]      Labs: BNP (last 3 results) No results for input(s): BNP in the last 8760 hours. Basic Metabolic Panel: Recent Labs  Lab 12/10/19 1434 12/11/19 0531  NA 142 139  K 3.9 4.0  CL 108 107  CO2 26 26  GLUCOSE 108* 115*  BUN 17 12  CREATININE 1.12 0.91  CALCIUM 8.3* 8.3*  MG  --  2.1   Liver Function Tests: Recent Labs  Lab 12/10/19 1434 12/11/19 0531  AST 21 18  ALT 13 12  ALKPHOS 43 43  BILITOT 0.8 1.0  PROT 6.0* 5.9*  ALBUMIN 3.5 3.4*   No results for input(s): LIPASE, AMYLASE in the last 168 hours. No results for input(s): AMMONIA in the last 168 hours. CBC: Recent Labs  Lab 12/10/19 1434 12/11/19 0531  WBC 8.5 5.6  HGB 10.5* 11.2*  HCT 30.9* 33.7*  MCV 90.9 94.4  PLT 104* 94*   Cardiac Enzymes: Recent Labs  Lab 12/10/19 1434  CKTOTAL 86   BNP: Invalid input(s): POCBNP CBG: No results for input(s): GLUCAP in the last 168 hours. D-Dimer No results for input(s): DDIMER in the last 72 hours. Hgb A1c No results for input(s): HGBA1C in the last 72 hours. Lipid Profile No results for  input(s): CHOL, HDL, LDLCALC, TRIG, CHOLHDL, LDLDIRECT in the last 72 hours. Thyroid function studies Recent Labs    12/10/19 1627  TSH 0.769   Anemia work up No results for input(s): VITAMINB12, FOLATE, FERRITIN, TIBC, IRON, RETICCTPCT in the last 72 hours. Urinalysis    Component Value Date/Time   COLORURINE YELLOW (A) 12/10/2019 1555   APPEARANCEUR CLEAR (A) 12/10/2019 1555   APPEARANCEUR Clear 07/20/2018 0840   LABSPEC 1.012 12/10/2019 1555   LABSPEC 1.002 12/17/2013 1755   PHURINE 6.0 12/10/2019 1555   GLUCOSEU NEGATIVE 12/10/2019 1555   GLUCOSEU Negative 12/17/2013 1755   HGBUR NEGATIVE 12/10/2019 [place] 12/10/2019 1555   BILIRUBINUR Negative 07/20/2018 [place]  Negative 12/17/2013 [place] 12/10/2019 1555   PROTEINUR NEGATIVE 12/10/2019 1555   UROBILINOGEN 2.0 (H) 11/29/2013 1404   NITRITE NEGATIVE 12/10/2019 1555   LEUKOCYTESUR NEGATIVE 12/10/2019 1555   LEUKOCYTESUR Negative 12/17/2013 1755   Sepsis Labs Invalid input(s): PROCALCITONIN,  WBC,  LACTICIDVEN Microbiology Recent Results (from the past 240 hour(s))  SARS Coronavirus 2 by RT PCR (hospital order, performed in [facility] hospital lab) Nasopharyngeal Nasopharyngeal Swab     Status: None   Collection Time: 12/10/19  7:22 PM   Specimen: Nasopharyngeal Swab  Result Value Ref Range Status   SARS Coronavirus 2 NEGATIVE NEGATIVE Final    Comment: (NOTE) SARS-CoV-2 target nucleic acids are NOT DETECTED.  The SARS-CoV-2 RNA is generally detectable in upper and lower respiratory specimens during the acute phase of infection. The lowest concentration of SARS-CoV-2 viral copies this assay can detect is 250 copies / mL. A negative result does not preclude SARS-CoV-2 infection and should not be used as the sole basis for treatment or other patient management decisions.  A negative result may occur with improper specimen collection / handling, submission of specimen  other than nasopharyngeal swab, presence of viral mutation(s) within the areas targeted by this assay, and inadequate number of viral copies (<250 copies / mL). A negative result must be combined with clinical observations, patient history, and epidemiological information.  Fact Sheet for Patients:   StrictlyIdeas.no  Fact Sheet for Healthcare Providers: BankingDealers.co.za  This test is not yet approved or  cleared by the [place] FDA and has been authorized for detection and/or diagnosis of SARS-CoV-2 by FDA under an Emergency Use Authorization (EUA).  This EUA will remain in effect (meaning this test can be used) for the duration of the COVID-19 declaration under Section 564(b)(1) of the Act, 21 U.S.C. section 360bbb-3(b)(1), unless the authorization is terminated or revoked sooner.  Performed at [facility], [address] [place], [place] [address]     Time coordinating discharge: Over 30 minutes  SIGNED:  Lorella Nimrod, MD  [facility] 12/11/2019, 12:39 PM  If 7PM-7AM, please contact night-coverage [email or web address]  This record has been created using Systems analyst. Errors have been sought and corrected,but may not always be located. Such creation errors do not reflect on the standard of care.

## 2019-12-11 NOTE — Plan of Care (Signed)
Pt had 2 episodes of ST in the 140's and then he would drop to the high 90s.  Checked on pt and he was sleeping.  The same thing happened when pt was in the ED for same dx in June.  Text paged the oncall NP and she said to let her know if he sustains in the 140s.

## 2019-12-14 ENCOUNTER — Telehealth: Payer: Self-pay

## 2019-12-14 LAB — LEVETIRACETAM LEVEL: Levetiracetam Lvl: <1 ug/mL — ABNORMAL LOW (ref 10.0–40.0)

## 2019-12-14 NOTE — Telephone Encounter (Signed)
Number not in service not able to confirm and screen for 12-16-19 ov.

## 2019-12-15 ENCOUNTER — Ambulatory Visit: Admission: RE | Admit: 2019-12-15 | Payer: Medicare HMO | Source: Ambulatory Visit | Admitting: Radiation Oncology

## 2019-12-15 DIAGNOSIS — C119 Malignant neoplasm of nasopharynx, unspecified: Secondary | ICD-10-CM

## 2019-12-16 ENCOUNTER — Ambulatory Visit: Payer: Medicare HMO | Admitting: Nurse Practitioner

## 2019-12-29 ENCOUNTER — Other Ambulatory Visit: Payer: Self-pay | Admitting: Oncology

## 2020-01-26 ENCOUNTER — Other Ambulatory Visit: Payer: Self-pay | Admitting: *Deleted

## 2020-01-27 MED ORDER — ALPRAZOLAM 1 MG PO TABS
1.0000 mg | ORAL_TABLET | Freq: Two times a day (BID) | ORAL | 0 refills | Status: DC | PRN
Start: 2020-01-27 — End: 2020-07-24

## 2020-02-03 ENCOUNTER — Inpatient Hospital Stay: Payer: Medicare HMO

## 2020-02-03 ENCOUNTER — Inpatient Hospital Stay: Payer: Medicare HMO | Attending: Oncology | Admitting: Oncology

## 2020-02-03 ENCOUNTER — Other Ambulatory Visit: Payer: Self-pay

## 2020-02-03 VITALS — BP 112/74 | HR 73 | Temp 96.7°F | Resp 18 | Wt 188.9 lb

## 2020-02-03 DIAGNOSIS — Z7982 Long term (current) use of aspirin: Secondary | ICD-10-CM | POA: Insufficient documentation

## 2020-02-03 DIAGNOSIS — Z452 Encounter for adjustment and management of vascular access device: Secondary | ICD-10-CM | POA: Insufficient documentation

## 2020-02-03 DIAGNOSIS — C119 Malignant neoplasm of nasopharynx, unspecified: Secondary | ICD-10-CM | POA: Diagnosis not present

## 2020-02-03 DIAGNOSIS — Z923 Personal history of irradiation: Secondary | ICD-10-CM | POA: Insufficient documentation

## 2020-02-03 DIAGNOSIS — F1010 Alcohol abuse, uncomplicated: Secondary | ICD-10-CM | POA: Diagnosis not present

## 2020-02-03 DIAGNOSIS — F329 Major depressive disorder, single episode, unspecified: Secondary | ICD-10-CM | POA: Insufficient documentation

## 2020-02-03 DIAGNOSIS — G8929 Other chronic pain: Secondary | ICD-10-CM | POA: Insufficient documentation

## 2020-02-03 DIAGNOSIS — Z9221 Personal history of antineoplastic chemotherapy: Secondary | ICD-10-CM | POA: Insufficient documentation

## 2020-02-03 DIAGNOSIS — R634 Abnormal weight loss: Secondary | ICD-10-CM | POA: Diagnosis not present

## 2020-02-03 DIAGNOSIS — J449 Chronic obstructive pulmonary disease, unspecified: Secondary | ICD-10-CM | POA: Insufficient documentation

## 2020-02-03 DIAGNOSIS — I10 Essential (primary) hypertension: Secondary | ICD-10-CM | POA: Diagnosis not present

## 2020-02-03 DIAGNOSIS — Z79899 Other long term (current) drug therapy: Secondary | ICD-10-CM | POA: Insufficient documentation

## 2020-02-03 DIAGNOSIS — Z87891 Personal history of nicotine dependence: Secondary | ICD-10-CM | POA: Diagnosis not present

## 2020-02-03 DIAGNOSIS — Z95828 Presence of other vascular implants and grafts: Secondary | ICD-10-CM

## 2020-02-03 DIAGNOSIS — G4730 Sleep apnea, unspecified: Secondary | ICD-10-CM | POA: Diagnosis not present

## 2020-02-03 DIAGNOSIS — R69 Illness, unspecified: Secondary | ICD-10-CM | POA: Diagnosis not present

## 2020-02-03 MED ORDER — HEPARIN SOD (PORK) LOCK FLUSH 100 UNIT/ML IV SOLN
INTRAVENOUS | Status: AC
  Filled 2020-02-03: qty 5

## 2020-02-03 MED ORDER — OLANZAPINE 10 MG PO TABS: 10.0000 mg | ORAL_TABLET | Freq: Every day | ORAL | 1 refills | Status: DC

## 2020-02-03 MED ORDER — HEPARIN SOD (PORK) LOCK FLUSH 100 UNIT/ML IV SOLN
500.0000 [IU] | Freq: Once | INTRAVENOUS | Status: AC
  Administered 2020-02-03: 500 [IU] via INTRAVENOUS
  Filled 2020-02-03: qty 5

## 2020-02-03 MED ORDER — SODIUM CHLORIDE 0.9% FLUSH
10.0000 mL | INTRAVENOUS | Status: DC | PRN
  Administered 2020-02-03: 10 mL via INTRAVENOUS
  Filled 2020-02-03: qty 10

## 2020-02-03 NOTE — Progress Notes (Signed)
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 Pryor Ochoa for symptoms of ear pain and discharge. Patient also noticed associated hearing loss and tinnitus. Patient was noted to have erythema and edema in the left sphenopalatine fossa as well as an exophytic mass on NPL exam. CT soft tissue of the neck showed asymmetric soft tissue within the left nasopharynx measuring 2.4 x 2 x 3.2 cm. There is lateral bulging with encroachment upon the left parapharyngeal space without frank infiltration. No extension into oropharynx or nasal cavity. No skull base infiltration. No appreciable mass within the larynx. No pathologically enlarged cervical lymph nodes. 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 He was only able to receive 6 doses of weekly cisplatin 40 mg due to cytopenias and AKI   Interval history-patient reports his appetite has improved quite a bit after starting olanzapine.  He was admitted to the hospital on 12/11/2019 for symptoms of altered  mental status and his urine was positive for cocaine.  Patient states that he has not snorted or used any cocaine and not sure how cocaine got into his system.  Currently denies any drug use.  ECOG PS- 1 Pain scale- 0   Review of systems- Review of Systems  Constitutional: Positive for malaise/fatigue. Negative for chills, fever and weight loss.  HENT: Negative for congestion, ear discharge and nosebleeds.   Eyes: Negative for blurred vision.  Respiratory: Negative for cough, hemoptysis, sputum production, shortness of breath and wheezing.   Cardiovascular: Negative for chest pain, palpitations, orthopnea and claudication.  Gastrointestinal: Negative for abdominal pain, blood in stool, constipation, diarrhea, heartburn, melena, nausea and vomiting.  Genitourinary: Negative for dysuria, flank pain, frequency, hematuria and urgency.  Musculoskeletal: Negative for back pain, joint pain and myalgias.  Skin: Negative for rash.  Neurological: Negative for dizziness, tingling, focal weakness, seizures, weakness and headaches.  Endo/Heme/Allergies: Does not bruise/bleed easily.  Psychiatric/Behavioral: Negative for depression and suicidal ideas. The patient does not have insomnia.       Allergies  Allergen Reactions  . Opana [Oxymorphone Hcl]     Made him BLACKOUT  . Amoxicillin Other (See Comments)  . Amoxicillin   . Oxymorphone Other (See Comments)  . Oxymorphone   . Sulfa Antibiotics Other (See Comments)  . Sulfa Antibiotics   . Sulfur     Childhood reaction      Past Medical History:  Diagnosis Date  . Alcohol abuse   . Benzodiazepine dependence ([place])   . Benzodiazepine withdrawal ([place])   . Chronic pain in right foot   . COPD (chronic obstructive pulmonary disease) ([place])   . Depression 08/19/2017  . Depression   . Hepatitis C   .  Hypertension   . 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[place])   . Seizures ([place]) 2010  . Seizures ([place])   . Sleep apnea   . Syncope    questionable vasovagal    . Syncope      Past Surgical History:  Procedure Laterality Date  . COLONOSCOPY WITH PROPOFOL N/A 12/16/2016   Procedure: COLONOSCOPY WITH PROPOFOL;  Surgeon: Lollie Sails, MD;  Location: [facility] ENDOSCOPY;  Service: Endoscopy;  Laterality: N/A;  . COLONOSCOPY WITH PROPOFOL N/A 03/14/2017   Procedure: COLONOSCOPY WITH PROPOFOL;  Surgeon: Lollie Sails, MD;  Location: [facility] ENDOSCOPY;  Service: Endoscopy;  Laterality: N/A;  . COLONOSCOPY WITH PROPOFOL    . FOOT SURGERY Right 03/12/2002  . FOOT SURGERY    . HEMORRHOID SURGERY    . LITHOTRIPSY    . MYRINGOTOMY    . MYRINGOTOMY WITH TUBE PLACEMENT Left 04/28/2019   Procedure: MYRINGOTOMY WITH TUBE PLACEMENT;  Surgeon: Carloyn Manner, MD;  Location: [place];  Service: ENT;  Laterality: Left;  . NASOPHARYNGOSCOPY N/A 04/28/2019   Procedure: NASOPHARYNGOSCOPY WITH BIOPSY;  Surgeon: Carloyn Manner, MD;  Location: [facility];  Service: ENT;  Laterality: N/A;  . NASOPHARYNGOSCOPY    . Port a cath Placement    . PORTA CATH INSERTION N/A 05/20/2019   Procedure: PORTA CATH INSERTION;  Surgeon: Algernon Huxley, MD;  Location: [place] CV LAB;  Service: Cardiovascular;  Laterality: N/A;  . TONSILLECTOMY      Social History   Socioeconomic History  . Marital status: Married    Spouse name: Not on file  . Number of children: Not on file  . Years of education: Not on file  . Highest education level: Not on file  Occupational History  . Not on file  Tobacco Use  . Smoking status: Former Smoker    Packs/day: 0.50    Years: 50.00    Pack years: 25.00    Types: Cigarettes    Quit date: 02/05/2019    Years since quitting: 0.9  . Smokeless tobacco: Former Network engineer  . Vaping Use: Never used  Substance and Sexual Activity  . Alcohol use: Not Currently  . Drug use: Not Currently  . Sexual activity: Not Currently  Other Topics Concern  . Not on file  Social History Narrative   ** Merged History  Encounter **       Social Determinants of Health   Financial Resource Strain:   . Difficulty of Paying Living Expenses: Not on file  Food Insecurity: No Food Insecurity  . Worried About Charity fundraiser in the Last Year: Never true  . Ran Out of Food in the Last Year: Never true  Transportation Needs: No Transportation Needs  . Lack of Transportation (Medical): No  . Lack of Transportation (Non-Medical): No  Physical Activity: Inactive  . Days of Exercise per Week: 0 days  . Minutes of Exercise per Session: 0 min  Stress:   . Feeling of Stress : Not on file  Social Connections:   . Frequency of Communication with Friends and Family: Not on file  . Frequency of Social Gatherings with Friends and Family: Not on file  . Attends Religious Services: Not on file  . Active Member of Clubs or Organizations: Not on file  . Attends Archivist Meetings: Not on file  . Marital Status: Not on file  Intimate Partner Violence: Not At Risk  . Fear of Current or Ex-Partner: No  . Emotionally Abused:  No  . Physically Abused: No  . Sexually Abused: No    Family History  Problem Relation Age of Onset  . Arthritis Mother   . Hypertension Mother   . Cancer Father   . Kidney disease Father   . Alcohol abuse Paternal Aunt   . Depression Maternal Grandfather      Current Outpatient Medications:  .  albuterol (VENTOLIN HFA) 108 (90 Base) MCG/ACT inhaler, Inhale 2 puffs into the lungs every 6 (six) hours as needed for wheezing or shortness of breath., Disp: 18 g, Rfl: 3 .  ALPRAZolam (XANAX) 1 MG tablet, Take 1 tablet (1 mg total) by mouth 2 (two) times daily as needed for anxiety., Disp: 60 tablet, Rfl: 0 .  amLODipine (NORVASC) 5 MG tablet, Take 1 tablet (5 mg total) by mouth daily., Disp: 90 tablet, Rfl: 3 .  antiseptic oral rinse (BIOTENE) LIQD, 15 mLs by Mouth Rinse route as needed for dry mouth., Disp: , Rfl:  .  aspirin EC 81 MG tablet, Take 81 mg by mouth daily. Swallow  whole., Disp: , Rfl:  .  Magnesium 500 MG CAPS, Take 500 mg by mouth in the morning and at bedtime. , Disp: , Rfl:  .  Multiple Vitamin (MULTIVITAMIN WITH MINERALS) TABS tablet, Take 1 tablet by mouth daily., Disp: , Rfl:  .  OLANZapine (ZYPREXA) 10 MG tablet, Take 1 tablet (10 mg total) by mouth at bedtime., Disp: 30 tablet, Rfl: 1 .  omeprazole (PRILOSEC) 20 MG capsule, Take 20 mg by mouth daily., Disp: , Rfl:  No current facility-administered medications for this visit.  Facility-Administered Medications Ordered in Other Visits:  .  heparin lock flush 100 unit/mL, 500 Units, Intravenous, Once, Sindy Guadeloupe, MD  Physical exam:  Vitals:   02/03/20 1001  BP: 112/74  Pulse: 73  Resp: 18  Temp: (!) 96.7 F (35.9 C)  TempSrc: Tympanic  SpO2: 100%  Weight: 188 lb 14.4 oz (85.7 kg)   Physical Exam Constitutional:      General: He is not in acute distress. Cardiovascular:     Rate and Rhythm: Normal rate and regular rhythm.     Heart sounds: Normal heart sounds.  Pulmonary:     Effort: Pulmonary effort is normal.     Breath sounds: Normal breath sounds.  Abdominal:     General: Bowel sounds are normal.     Palpations: Abdomen is soft.  Skin:    General: Skin is warm and dry.  Neurological:     Mental Status: He is alert and oriented to person, place, and time.      CMP Latest Ref Rng & Units 12/11/2019  Glucose 70 - 99 mg/dL 115(H)  BUN 8 - 23 mg/dL 12  Creatinine 0.61 - 1.24 mg/dL 0.91  Sodium 135 - 145 mmol/L 139  Potassium 3.5 - 5.1 mmol/L 4.0  Chloride 98 - 111 mmol/L 107  CO2 22 - 32 mmol/L 26  Calcium 8.9 - 10.3 mg/dL 8.3(L)  Total Protein 6.5 - 8.1 g/dL 5.9(L)  Total Bilirubin 0.3 - 1.2 mg/dL 1.0  Alkaline Phos 38 - 126 U/L 43  AST 15 - 41 U/L 18  ALT 0 - 44 U/L 12   CBC Latest Ref Rng & Units 12/11/2019  WBC 4.0 - 10.5 K/uL 5.6  Hemoglobin 13.0 - 17.0 g/dL 11.2(L)  Hematocrit 39 - 52 % 33.7(L)  Platelets 150 - 400 K/uL 94(L)      Assessment and plan-  Patient is a 69  y.o. male with history of nasopharyngeal carcinoma stage II T2 N0 M0. He is s/p concurrent chemoradiation with weekly cisplatin chemotherapy.  He is here for routine follow-up of ongoing weight loss  Abnormal weight loss: Today his weight is up 288 pounds. Patient reports improvement in his appetite after starting olanzapine which was down to 167 pounds 2 months ago.  He will therefore continue taking his olanzapine at this time.  Clinically he is doing well with no concerning symptoms of recurrence.  No palpable cervical adenopathy.  He will continue to follow-up with Dr. Pryor Ochoa as well.  I will see him back in 3 months with labs CBC with differential and CMP    Visit Diagnosis 1. Abnormal weight loss   2. Nasopharyngeal carcinoma ([place])      Dr. Randa Evens, MD, MPH [facility] [phone] 02/03/2020 3:15 PM

## 2020-02-22 DIAGNOSIS — H6982 Other specified disorders of Eustachian tube, left ear: Secondary | ICD-10-CM | POA: Diagnosis not present

## 2020-02-22 DIAGNOSIS — M26652 Arthropathy of left temporomandibular joint: Secondary | ICD-10-CM | POA: Diagnosis not present

## 2020-02-22 DIAGNOSIS — Z8522 Personal history of malignant neoplasm of nasal cavities, middle ear, and accessory sinuses: Secondary | ICD-10-CM | POA: Diagnosis not present

## 2020-02-22 DIAGNOSIS — H6122 Impacted cerumen, left ear: Secondary | ICD-10-CM | POA: Diagnosis not present

## 2020-02-28 ENCOUNTER — Other Ambulatory Visit: Payer: Self-pay | Admitting: Oncology

## 2020-03-22 ENCOUNTER — Other Ambulatory Visit: Payer: Self-pay | Admitting: *Deleted

## 2020-03-23 ENCOUNTER — Inpatient Hospital Stay: Payer: Medicare HMO | Attending: Oncology

## 2020-03-23 DIAGNOSIS — G4730 Sleep apnea, unspecified: Secondary | ICD-10-CM | POA: Insufficient documentation

## 2020-03-23 DIAGNOSIS — J449 Chronic obstructive pulmonary disease, unspecified: Secondary | ICD-10-CM | POA: Insufficient documentation

## 2020-03-23 DIAGNOSIS — Z7982 Long term (current) use of aspirin: Secondary | ICD-10-CM | POA: Diagnosis not present

## 2020-03-23 DIAGNOSIS — Z923 Personal history of irradiation: Secondary | ICD-10-CM | POA: Insufficient documentation

## 2020-03-23 DIAGNOSIS — I10 Essential (primary) hypertension: Secondary | ICD-10-CM | POA: Diagnosis not present

## 2020-03-23 DIAGNOSIS — C119 Malignant neoplasm of nasopharynx, unspecified: Secondary | ICD-10-CM | POA: Insufficient documentation

## 2020-03-23 DIAGNOSIS — G8929 Other chronic pain: Secondary | ICD-10-CM | POA: Insufficient documentation

## 2020-03-23 DIAGNOSIS — Z9221 Personal history of antineoplastic chemotherapy: Secondary | ICD-10-CM | POA: Diagnosis not present

## 2020-03-23 DIAGNOSIS — F329 Major depressive disorder, single episode, unspecified: Secondary | ICD-10-CM | POA: Insufficient documentation

## 2020-03-23 DIAGNOSIS — Z95828 Presence of other vascular implants and grafts: Secondary | ICD-10-CM

## 2020-03-23 DIAGNOSIS — Z79899 Other long term (current) drug therapy: Secondary | ICD-10-CM | POA: Diagnosis not present

## 2020-03-23 DIAGNOSIS — Z452 Encounter for adjustment and management of vascular access device: Secondary | ICD-10-CM | POA: Diagnosis not present

## 2020-03-23 DIAGNOSIS — F1010 Alcohol abuse, uncomplicated: Secondary | ICD-10-CM | POA: Diagnosis not present

## 2020-03-23 DIAGNOSIS — Z87891 Personal history of nicotine dependence: Secondary | ICD-10-CM | POA: Insufficient documentation

## 2020-03-23 DIAGNOSIS — R69 Illness, unspecified: Secondary | ICD-10-CM | POA: Diagnosis not present

## 2020-03-23 MED ORDER — SODIUM CHLORIDE 0.9% FLUSH
10.0000 mL | Freq: Once | INTRAVENOUS | Status: AC
  Administered 2020-03-23: 10 mL via INTRAVENOUS
  Filled 2020-03-23: qty 10

## 2020-03-23 MED ORDER — HEPARIN SOD (PORK) LOCK FLUSH 100 UNIT/ML IV SOLN
500.0000 [IU] | Freq: Once | INTRAVENOUS | Status: AC
  Administered 2020-03-23: 500 [IU] via INTRAVENOUS
  Filled 2020-03-23: qty 5

## 2020-03-23 MED ORDER — HEPARIN SOD (PORK) LOCK FLUSH 100 UNIT/ML IV SOLN
INTRAVENOUS | Status: AC
  Filled 2020-03-23: qty 5

## 2020-04-24 ENCOUNTER — Telehealth: Payer: Self-pay | Admitting: Oncology

## 2020-04-24 NOTE — Telephone Encounter (Signed)
Called pt to reschedule 12/16 appointment to new time that day

## 2020-04-27 ENCOUNTER — Other Ambulatory Visit: Payer: Self-pay

## 2020-04-27 ENCOUNTER — Inpatient Hospital Stay (HOSPITAL_BASED_OUTPATIENT_CLINIC_OR_DEPARTMENT_OTHER): Payer: Medicare HMO | Admitting: Oncology

## 2020-04-27 ENCOUNTER — Inpatient Hospital Stay: Payer: Medicare HMO | Attending: Oncology

## 2020-04-27 ENCOUNTER — Encounter: Payer: Self-pay | Admitting: Oncology

## 2020-04-27 VITALS — BP 121/74 | HR 78 | Temp 98.3°F | Resp 18 | Wt 197.6 lb

## 2020-04-27 DIAGNOSIS — Z7982 Long term (current) use of aspirin: Secondary | ICD-10-CM | POA: Diagnosis not present

## 2020-04-27 DIAGNOSIS — G8929 Other chronic pain: Secondary | ICD-10-CM | POA: Diagnosis not present

## 2020-04-27 DIAGNOSIS — Z8589 Personal history of malignant neoplasm of other organs and systems: Secondary | ICD-10-CM

## 2020-04-27 DIAGNOSIS — Z923 Personal history of irradiation: Secondary | ICD-10-CM | POA: Diagnosis not present

## 2020-04-27 DIAGNOSIS — Z9221 Personal history of antineoplastic chemotherapy: Secondary | ICD-10-CM | POA: Insufficient documentation

## 2020-04-27 DIAGNOSIS — C119 Malignant neoplasm of nasopharynx, unspecified: Secondary | ICD-10-CM

## 2020-04-27 DIAGNOSIS — G4730 Sleep apnea, unspecified: Secondary | ICD-10-CM | POA: Diagnosis not present

## 2020-04-27 DIAGNOSIS — D696 Thrombocytopenia, unspecified: Secondary | ICD-10-CM | POA: Insufficient documentation

## 2020-04-27 DIAGNOSIS — Z79899 Other long term (current) drug therapy: Secondary | ICD-10-CM | POA: Diagnosis not present

## 2020-04-27 DIAGNOSIS — F329 Major depressive disorder, single episode, unspecified: Secondary | ICD-10-CM | POA: Diagnosis not present

## 2020-04-27 DIAGNOSIS — Z08 Encounter for follow-up examination after completed treatment for malignant neoplasm: Secondary | ICD-10-CM | POA: Diagnosis not present

## 2020-04-27 DIAGNOSIS — I10 Essential (primary) hypertension: Secondary | ICD-10-CM | POA: Diagnosis not present

## 2020-04-27 DIAGNOSIS — F1010 Alcohol abuse, uncomplicated: Secondary | ICD-10-CM | POA: Insufficient documentation

## 2020-04-27 DIAGNOSIS — Z85818 Personal history of malignant neoplasm of other sites of lip, oral cavity, and pharynx: Secondary | ICD-10-CM | POA: Insufficient documentation

## 2020-04-27 DIAGNOSIS — R69 Illness, unspecified: Secondary | ICD-10-CM | POA: Diagnosis not present

## 2020-04-27 DIAGNOSIS — Z87891 Personal history of nicotine dependence: Secondary | ICD-10-CM | POA: Diagnosis not present

## 2020-04-27 LAB — CBC WITH DIFFERENTIAL/PLATELET
Abs Immature Granulocytes: 0.02 10*3/uL (ref 0.00–0.07)
Basophils Absolute: 0 10*3/uL (ref 0.0–0.1)
Basophils Relative: 0 %
Eosinophils Absolute: 0.2 10*3/uL (ref 0.0–0.5)
Eosinophils Relative: 3 %
HCT: 40.2 % (ref 39.0–52.0)
Hemoglobin: 14 g/dL (ref 13.0–17.0)
Immature Granulocytes: 0 %
Lymphocytes Relative: 23 %
Lymphs Abs: 1.3 10*3/uL (ref 0.7–4.0)
MCH: 32.6 pg (ref 26.0–34.0)
MCHC: 34.8 g/dL (ref 30.0–36.0)
MCV: 93.7 fL (ref 80.0–100.0)
Monocytes Absolute: 0.7 10*3/uL (ref 0.1–1.0)
Monocytes Relative: 11 %
Neutro Abs: 3.6 10*3/uL (ref 1.7–7.7)
Neutrophils Relative %: 63 %
Platelets: 139 10*3/uL — ABNORMAL LOW (ref 150–400)
RBC: 4.29 MIL/uL (ref 4.22–5.81)
RDW: 14.2 % (ref 11.5–15.5)
WBC: 5.7 10*3/uL (ref 4.0–10.5)
nRBC: 0 % (ref 0.0–0.2)

## 2020-04-27 LAB — COMPREHENSIVE METABOLIC PANEL
ALT: 15 U/L (ref 0–44)
AST: 24 U/L (ref 15–41)
Albumin: 4.3 g/dL (ref 3.5–5.0)
Alkaline Phosphatase: 51 U/L (ref 38–126)
Anion gap: 10 (ref 5–15)
BUN: 9 mg/dL (ref 8–23)
CO2: 26 mmol/L (ref 22–32)
Calcium: 8.9 mg/dL (ref 8.9–10.3)
Chloride: 99 mmol/L (ref 98–111)
Creatinine, Ser: 1.13 mg/dL (ref 0.61–1.24)
GFR, Estimated: >60 mL/min (ref >60)
Glucose, Bld: 145 mg/dL — ABNORMAL HIGH (ref 70–99)
Potassium: 3.4 mmol/L — ABNORMAL LOW (ref 3.5–5.1)
Sodium: 135 mmol/L (ref 135–145)
Total Bilirubin: 0.6 mg/dL (ref 0.3–1.2)
Total Protein: 7.2 g/dL (ref 6.5–8.1)

## 2020-04-27 NOTE — Progress Notes (Signed)
Patient here for oncology follow-up appointment, expresses no complaints or concerns at this time.    

## 2020-04-28 NOTE — Progress Notes (Signed)
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 Pryor Ochoa for symptoms of ear pain and discharge. Patient also noticed associated hearing loss and tinnitus. Patient was noted to have erythema and edema in the left sphenopalatine fossa as well as an exophytic mass on NPL exam. CT soft tissue of the neck showed asymmetric soft tissue within the left nasopharynx measuring 2.4 x 2 x 3.2 cm. There is lateral bulging with encroachment upon the left parapharyngeal space without frank infiltration. No extension into oropharynx or nasal cavity. No skull base infiltration. No appreciable mass within the larynx. No pathologically enlarged cervical lymph nodes. 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 He was only able to receive 6 doses of weekly cisplatin 40 mg due to cytopenias and AKI    Interval history-patient reports doing very well at this time. He has gained quite a bit of weight in the last 3 months and wants to cut down on his weight and little bit.  He has stopped  drinking his Ensure and is not requiring Zyprexa anymore.  Denies any pain during swallowing.    ECOG PS- 1 Pain scale- 0   Review of systems- Review of Systems  Constitutional: Negative for chills, fever, malaise/fatigue and weight loss.  HENT: Negative for congestion, ear discharge and nosebleeds.   Eyes: Negative for blurred vision.  Respiratory: Negative for cough, hemoptysis, sputum production, shortness of breath and wheezing.   Cardiovascular: Negative for chest pain, palpitations, orthopnea and claudication.  Gastrointestinal: Negative for abdominal pain, blood in stool, constipation, diarrhea, heartburn, melena, nausea and vomiting.  Genitourinary: Negative for dysuria, flank pain, frequency, hematuria and urgency.  Musculoskeletal: Negative for back pain, joint pain and myalgias.  Skin: Negative for rash.  Neurological: Negative for dizziness, tingling, focal weakness, seizures, weakness and headaches.  Endo/Heme/Allergies: Does not bruise/bleed easily.  Psychiatric/Behavioral: Negative for depression and suicidal ideas. The patient does not have insomnia.      Allergies  Allergen Reactions  . Opana [Oxymorphone Hcl]     Made him BLACKOUT  . Amoxicillin Other (See Comments)  . Amoxicillin   . Oxymorphone Other (See Comments)  . Oxymorphone   . Sulfa Antibiotics Other (See Comments)  . Sulfa Antibiotics   . Sulfur     Childhood reaction      Past Medical History:  Diagnosis Date  . Alcohol abuse   . Benzodiazepine dependence ([place])   . Benzodiazepine withdrawal ([place])   . Chronic pain in right foot   . COPD (chronic obstructive pulmonary disease) ([place])   . Depression 08/19/2017  . Depression   . Hepatitis C   . Hypertension   . 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[place])   . Seizures (  [place]) 2010  . Seizures ([place])   . Sleep apnea   . Syncope    questionable vasovagal  . Syncope      Past Surgical History:  Procedure Laterality Date  . COLONOSCOPY WITH PROPOFOL N/A  12/16/2016   Procedure: COLONOSCOPY WITH PROPOFOL;  Surgeon: Lollie Sails, MD;  Location[facility] END[facility]COPY;  Service: Endoscopy;  Laterality: N/A;  . COLONOSCOPY WITH PROPOFOL N/A 03/14/2017   Procedure: COLONOSCOPY WITH PROPOFOL;  Surgeon: Lollie Sails, MD;  Location: [facility] ENDOSCOPY;  Service: Endoscopy;  Laterality: N/A;  . COLONOSCOPY WITH PROPOFOL    . FOOT SURGERY Right 03/12/2002  . FOOT SURGERY    . HEMORRHOID SURGERY    . LITHOTRIPSY    . MYRINGOTOMY    . MYRINGOTOMY WITH TUBE PLACEMENT Left 04/28/2019   Procedure: MYRINGOTOMY WITH TUBE PLACEMENT;  Surgeon: Carloyn Manner, MD;  Location: [place];  Service: ENT;  Laterality: Left;  . NASOPHARYNGOSCOPY N/A 04/28/2019   Procedure: NASOPHARYNGOSCOPY WITH BIOPSY;  Surgeon: Carloyn Manner, MD;  Location: [place];  Service: ENT;  Laterality: N/A;  . NASOPHARYNGOSCOPY    . Port a cath Placement    . PORTA CATH INSERTION N/A 05/20/2019   Procedure: PORTA CATH INSERTION;  Surgeon: Algernon Huxley, MD;  Location: [place] CV LAB;  Service: Cardiovascular;  Laterality: N/A;  . TONSILLECTOMY      Social History   Socioeconomic History  . Marital status: Married    Spouse name: Not on file  . Number of children: Not on file  . Years of education: Not on file  . Highest education level: Not on file  Occupational History  . Not on file  Tobacco Use  . Smoking status: Former Smoker    Packs/day: 0.50    Years: 50.00    Pack years: 25.00    Types: Cigarettes    Quit date: 02/05/2019    Years since quitting: 1.2  . Smokeless tobacco: Former Network engineer  . Vaping Use: Never used  Substance and Sexual Activity  . Alcohol use: Not Currently  . Drug use: Not Currently  . Sexual activity: Not Currently  Other Topics Concern  . Not on file  Social History Narrative   ** Merged History Encounter **       Social Determinants of Health   Financial Resource Strain: Not on file  Food  Insecurity: No Food Insecurity  . Worried About Charity fundraiser in the Last Year: Never true  . Ran Out of Food in the Last Year: Never true  Transportation Needs: No Transportation Needs  . Lack of Transportation (Medical): No  . Lack of Transportation (Non-Medical): No  Physical Activity: Inactive  . Days of Exercise per Week: 0 days  . Minutes of Exercise per Session: 0 min  Stress: Not on file  Social Connections: Not on file  Intimate Partner Violence: Not At Risk  . Fear of Current or Ex-Partner: No  . Emotionally Abused: No  . Physically Abused: No  . Sexually Abused: No    Family History  Problem Relation Age of Onset  . Arthritis Mother   . Hypertension Mother   . Cancer Father   . Kidney disease Father   . Alcohol abuse Paternal Aunt   . Depression Maternal Grandfather      Current Outpatient Medications:  .  albuterol (VENTOLIN HFA) 108 (90 Base) MCG/ACT inhaler, Inhale 2 puffs into the lungs every 6 (six) hours as needed  for wheezing or shortness of breath., Disp: 18 g, Rfl: 3 .  amLODipine (NORVASC) 5 MG tablet, Take 1 tablet (5 mg total) by mouth daily., Disp: 90 tablet, Rfl: 3 .  aspirin EC 81 MG tablet, Take 81 mg by mouth daily. Swallow whole., Disp: , Rfl:  .  Multiple Vitamin (MULTIVITAMIN WITH MINERALS) TABS tablet, Take 1 tablet by mouth daily., Disp: , Rfl:  .  omeprazole (PRILOSEC) 20 MG capsule, Take 20 mg by mouth daily., Disp: , Rfl:  .  ALPRAZolam (XANAX) 1 MG tablet, Take 1 tablet (1 mg total) by mouth 2 (two) times daily as needed for anxiety. (Patient not taking: Reported on 04/27/2020), Disp: 60 tablet, Rfl: 0 .  antiseptic oral rinse (BIOTENE) LIQD, 15 mLs by Mouth Rinse route as needed for dry mouth. (Patient not taking: Reported on 04/27/2020), Disp: , Rfl:  .  fluticasone (FLONASE) 50 MCG/ACT nasal spray, Place 1 spray into both nostrils 2 (two) times daily., Disp: , Rfl:  .  Magnesium 500 MG CAPS, Take 500 mg by mouth in the morning and at  bedtime.  (Patient not taking: Reported on 04/27/2020), Disp: , Rfl:  .  OLANZapine (ZYPREXA) 10 MG tablet, Take 1 tablet (10 mg total) by mouth at bedtime. (Patient not taking: Reported on 04/27/2020), Disp: 30 tablet, Rfl: 1 No current facility-administered medications for this visit.  Facility-Administered Medications Ordered in Other Visits:  .  heparin lock flush 100 unit/mL, 500 Units, Intravenous, Once, Sindy Guadeloupe, MD  Physical exam:  Vitals:   04/27/20 1038  BP: 121/74  Pulse: 78  Resp: 18  Temp: 98.3 F (36.8 C)  TempSrc: Tympanic  SpO2: 96%  Weight: 197 lb 9.6 oz (89.6 kg)   Physical Exam HENT:     Mouth/Throat:     Mouth: Mucous membranes are moist.     Pharynx: Oropharynx is clear.  Eyes:     Extraocular Movements: EOM normal.  Cardiovascular:     Rate and Rhythm: Normal rate and regular rhythm.     Heart sounds: Normal heart sounds.  Pulmonary:     Effort: Pulmonary effort is normal.     Breath sounds: Normal breath sounds.  Abdominal:     General: Bowel sounds are normal.     Palpations: Abdomen is soft.  Musculoskeletal:     Cervical back: Normal range of motion.  Lymphadenopathy:     Comments: No palpable cervical adenopathy  Skin:    General: Skin is warm and dry.  Neurological:     Mental Status: He is alert and oriented to person, place, and time.      CMP Latest Ref Rng & Units 04/27/2020  Glucose 70 - 99 mg/dL 145(H)  BUN 8 - 23 mg/dL 9  Creatinine 0.61 - 1.24 mg/dL 1.13  Sodium 135 - 145 mmol/L 135  Potassium 3.5 - 5.1 mmol/L 3.4(L)  Chloride 98 - 111 mmol/L 99  CO2 22 - 32 mmol/L 26  Calcium 8.9 - 10.3 mg/dL 8.9  Total Protein 6.5 - 8.1 g/dL 7.2  Total Bilirubin 0.3 - 1.2 mg/dL 0.6  Alkaline Phos 38 - 126 U/L 51  AST 15 - 41 U/L 24  ALT 0 - 44 U/L 15   CBC Latest Ref Rng & Units 04/27/2020  WBC 4.0 - 10.5 K/uL 5.7  Hemoglobin 13.0 - 17.0 g/dL 14.0  Hematocrit 39.0 - 52.0 % 40.2  Platelets 150 - 400 K/uL 139(L)      Assessment and plan- Patient is  a 69 y.o. 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  This is a routine follow-up visit  Patient's hemoglobin is improved from 11.2-14.  He still has mild thrombocytopenia possibly secondary to prior cisplatin.  Clinically he is doing well with no concerning signs and symptoms of recurrence.  He also continues to follow-up with Dr. Pryor Ochoa.  I will see him back in 6 months with CBC with differential and CMP   Visit Diagnosis 1. Encounter for follow-up surveillance of head and neck cancer      Dr. Randa Evens, MD, MPH [facility] at [facility] [phone] 04/28/2020 8:38 AM

## 2020-06-28 ENCOUNTER — Telehealth: Payer: Self-pay

## 2020-06-28 NOTE — Telephone Encounter (Signed)
Mailed records to [facility]

## 2020-07-24 ENCOUNTER — Encounter: Payer: Self-pay | Admitting: Hospice and Palliative Medicine

## 2020-07-24 ENCOUNTER — Ambulatory Visit (INDEPENDENT_AMBULATORY_CARE_PROVIDER_SITE_OTHER): Payer: Medicare HMO | Admitting: Hospice and Palliative Medicine

## 2020-07-24 ENCOUNTER — Other Ambulatory Visit: Payer: Self-pay

## 2020-07-24 VITALS — BP 132/88 | HR 90 | Temp 97.4°F | Resp 16 | Ht 69.0 in | Wt 197.0 lb

## 2020-07-24 DIAGNOSIS — Z0001 Encounter for general adult medical examination with abnormal findings: Secondary | ICD-10-CM

## 2020-07-24 DIAGNOSIS — Z125 Encounter for screening for malignant neoplasm of prostate: Secondary | ICD-10-CM | POA: Diagnosis not present

## 2020-07-24 DIAGNOSIS — B370 Candidal stomatitis: Secondary | ICD-10-CM

## 2020-07-24 DIAGNOSIS — R5383 Other fatigue: Secondary | ICD-10-CM

## 2020-07-24 DIAGNOSIS — I10 Essential (primary) hypertension: Secondary | ICD-10-CM

## 2020-07-24 DIAGNOSIS — D229 Melanocytic nevi, unspecified: Secondary | ICD-10-CM

## 2020-07-24 DIAGNOSIS — M79605 Pain in left leg: Secondary | ICD-10-CM | POA: Diagnosis not present

## 2020-07-24 DIAGNOSIS — G8929 Other chronic pain: Secondary | ICD-10-CM | POA: Diagnosis not present

## 2020-07-24 DIAGNOSIS — H9201 Otalgia, right ear: Secondary | ICD-10-CM

## 2020-07-24 DIAGNOSIS — R300 Dysuria: Secondary | ICD-10-CM

## 2020-07-24 MED ORDER — AMLODIPINE BESYLATE 5 MG PO TABS: 5.0000 mg | ORAL_TABLET | Freq: Every day | ORAL | 3 refills | Status: DC

## 2020-07-24 MED ORDER — TIZANIDINE HCL 4 MG PO TABS: 4.0000 mg | ORAL_TABLET | Freq: Three times a day (TID) | ORAL | 2 refills | Status: DC | PRN

## 2020-07-24 MED ORDER — NYSTATIN 100000 UNIT/ML MT SUSP: 5.0000 mL | Freq: Four times a day (QID) | OROMUCOSAL | 0 refills | Status: DC

## 2020-07-24 NOTE — Progress Notes (Signed)
[facility] [place], [place] [address]  Internal MEDICINE  Office Visit Note  Patient Name: [name]  [id number]  [id number]  Date of Service: 07/24/2020  Chief Complaint  Patient presents with  . Medicare Wellness    Refill request, thrush in mouth, lower back pain for years, TMJ, right foot old injury still bothers pt  . Hypertension  . Sleep Apnea  . Depression  . COPD     HPI Pt is here for routine health maintenance examination Concerned today as he started having right ear pain over the weekend--similar feeling he was having prior to his cancer diagnosis History of nasopharyngeal carcinoma, s/p chemotherapy and radiation therapy (completed treatment March 2021) Also complaining of thrush in his mouth, has had multiple breakouts of thrush since completing radiation C/o right foot pain, has always been a bother since a previous injury more than 10 years prior, takes an occassional muscle relaxant to help with pain--has run out of prescription for muscle relaxant that was prescribed several years ago  Up to date on colonoscopy  Appetite has improved, has been able to gain his weight back he lost during chemotherapy treatments Sleeping well at night  Current Medication: Outpatient Encounter Medications as of 07/24/2020  Medication Sig  . antiseptic oral rinse (BIOTENE) LIQD 15 mLs by Mouth Rinse route as needed for dry mouth.  Marland Kitchen aspirin EC 81 MG tablet Take 81 mg by mouth daily. Swallow whole.  . Multiple Vitamin (MULTIVITAMIN WITH MINERALS) TABS tablet Take 1 tablet by mouth daily.  Marland Kitchen nystatin (MYCOSTATIN) 100000 UNIT/ML suspension Take 5 mLs (500,000 Units total) by mouth 4 (four) times daily.  Marland Kitchen tiZANidine (ZANAFLEX) 4 MG tablet Take 1 tablet (4 mg total) by mouth every 8 (eight) hours as needed for muscle spasms.  . [DISCONTINUED] amLODipine (NORVASC) 5 MG tablet Take 1 tablet (5 mg total) by mouth daily.  Marland Kitchen amLODipine (NORVASC)  5 MG tablet Take 1 tablet (5 mg total) by mouth daily.  . [DISCONTINUED] albuterol (VENTOLIN HFA) 108 (90 Base) MCG/ACT inhaler Inhale 2 puffs into the lungs every 6 (six) hours as needed for wheezing or shortness of breath. (Patient not taking: Reported on 07/24/2020)  . [DISCONTINUED] ALPRAZolam (XANAX) 1 MG tablet Take 1 tablet (1 mg total) by mouth 2 (two) times daily as needed for anxiety. (Patient not taking: Reported on 04/27/2020)  . [DISCONTINUED] fluticasone (FLONASE) 50 MCG/ACT nasal spray Place 1 spray into both nostrils 2 (two) times daily. (Patient not taking: Reported on 07/24/2020)  . [DISCONTINUED] Magnesium 500 MG CAPS Take 500 mg by mouth in the morning and at bedtime.  (Patient not taking: Reported on 04/27/2020)  . [DISCONTINUED] OLANZapine (ZYPREXA) 10 MG tablet Take 1 tablet (10 mg total) by mouth at bedtime. (Patient not taking: Reported on 04/27/2020)  . [DISCONTINUED] omeprazole (PRILOSEC) 20 MG capsule Take 20 mg by mouth daily. (Patient not taking: Reported on 07/24/2020)   Facility-Administered Encounter Medications as of 07/24/2020  Medication  . heparin lock flush 100 unit/mL    Surgical History: Past Surgical History:  Procedure Laterality Date  . COLONOSCOPY WITH PROPOFOL N/A 12/16/2016   Procedure: COLONOSCOPY WITH PROPOFOL;  Surgeon: Lollie Sails, MD;  Location: [facility] ENDOSCOPY;  Service: Endoscopy;  Laterality: N/A;  . COLONOSCOPY WITH PROPOFOL N/A 03/14/2017   Procedure: COLONOSCOPY WITH PROPOFOL;  Surgeon: Lollie Sails, MD;  Location: [facility] ENDOSCOPY;  Service: Endoscopy;  Laterality: N/A;  . COLONOSCOPY WITH PROPOFOL    .  FOOT SURGERY Right 03/12/2002  . FOOT SURGERY    . HEMORRHOID SURGERY    . LITHOTRIPSY    . MYRINGOTOMY    . MYRINGOTOMY WITH TUBE PLACEMENT Left 04/28/2019   Procedure: MYRINGOTOMY WITH TUBE PLACEMENT;  Surgeon: Carloyn Manner, MD;  Location: [place];  Service: ENT;  Laterality: Left;  . NASOPHARYNGOSCOPY N/A  04/28/2019   Procedure: NASOPHARYNGOSCOPY WITH BIOPSY;  Surgeon: Carloyn Manner, MD;  Location: [place];  Service: ENT;  Laterality: N/A;  . NASOPHARYNGOSCOPY    . Port a cath Placement    . PORTA CATH INSERTION N/A 05/20/2019   Procedure: PORTA CATH INSERTION;  Surgeon: Algernon Huxley, MD;  Location: [place] CV LAB;  Service: Cardiovascular;  Laterality: N/A;  . TONSILLECTOMY      Medical History: Past Medical History:  Diagnosis Date  . Alcohol abuse   . Benzodiazepine dependence ([place])   . Benzodiazepine withdrawal ([place])   . Chronic pain in right foot   . COPD (chronic obstructive pulmonary disease) ([place])   . Depression 08/19/2017  . Depression   . Hepatitis C   . Hypertension   . 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[place])   . Seizures ([place]) 2010  . Seizures ([place])   . Sleep apnea   . Syncope    questionable vasovagal  . Syncope     Family History: Family History  Problem Relation Age of Onset  . Arthritis Mother   . Hypertension Mother   . Cancer Father   . Kidney disease Father   . Alcohol abuse Paternal Aunt   . Depression Maternal Grandfather       Review of Systems  Constitutional: Negative for chills, fatigue and unexpected weight change.  HENT: Positive for ear pain. Negative for congestion, postnasal drip, rhinorrhea, sneezing and sore throat.        Thrush, right ear pain  Eyes: Negative for redness.  Respiratory: Negative for cough, chest tightness and shortness of breath.   Cardiovascular: Negative for chest pain and palpitations.  Gastrointestinal: Negative for abdominal pain, constipation, diarrhea, nausea and vomiting.  Genitourinary: Negative for dysuria and frequency.  Musculoskeletal: Negative for arthralgias, back pain, joint swelling and neck pain.       Right foot pain  Skin: Negative for rash.  Neurological: Negative for tremors and numbness.  Hematological: Negative for adenopathy. Does not bruise/bleed easily.  Psychiatric/Behavioral:  Negative for behavioral problems (Depression), sleep disturbance and suicidal ideas. The patient is not nervous/anxious.      Vital Signs: BP 132/88   Pulse 90   Temp (!) 97.4 F (36.3 C)   Resp 16   Ht 5\' 9"  (1.753 m)   Wt 197 lb (89.4 kg)   SpO2 97%   BMI 29.09 kg/m    Physical Exam Vitals reviewed.  Constitutional:      Appearance: Normal appearance. He is normal weight.  HENT:     Right Ear: Swelling and tenderness present.     Left Ear: Tympanic membrane, ear canal and external ear normal.     Ears:     Comments: Swelling within canal with erythema, tenderness when pressure applied on pinna, unable to fully visualize TM due to swelling Cardiovascular:     Rate and Rhythm: Normal rate and regular rhythm.     Pulses: Normal pulses.     Heart sounds: Normal heart sounds.  Pulmonary:     Effort: Pulmonary effort is normal.     Breath sounds: Normal breath sounds.  Abdominal:  General: Abdomen is flat.     Palpations: Abdomen is soft.  Musculoskeletal:        General: Normal range of motion.     Cervical back: Normal range of motion.  Skin:    General: Skin is warm.     Comments: Scattered AK on bilateral forearms  Neurological:     General: No focal deficit present.     Mental Status: He is alert and oriented to person, place, and time. Mental status is at baseline.  Psychiatric:        Mood and Affect: Mood normal.        Behavior: Behavior normal.        Thought Content: Thought content normal.        Judgment: Judgment normal.      LABS: Recent Results (from the past 2160 hour(s))  Comprehensive metabolic panel     Status: Abnormal   Collection Time: 04/27/20  9:55 AM  Result Value Ref Range   Sodium 135 135 - 145 mmol/L   Potassium 3.4 (L) 3.5 - 5.1 mmol/L   Chloride 99 98 - 111 mmol/L   CO2 26 22 - 32 mmol/L   Glucose, Bld 145 (H) 70 - 99 mg/dL    Comment: Glucose reference range applies only to samples taken after fasting for at least 8 hours.    BUN 9 8 - 23 mg/dL   Creatinine, Ser 1.13 0.61 - 1.24 mg/dL   Calcium 8.9 8.9 - 10.3 mg/dL   Total Protein 7.2 6.5 - 8.1 g/dL   Albumin 4.3 3.5 - 5.0 g/dL   AST 24 15 - 41 U/L   ALT 15 0 - 44 U/L   Alkaline Phosphatase 51 38 - 126 U/L   Total Bilirubin 0.6 0.3 - 1.2 mg/dL   GFR, Estimated >60 >60 mL/min    Comment: (NOTE) Calculated using the CKD-EPI Creatinine Equation (2021)    Anion gap 10 5 - 15    Comment: Performed at [facility], [address] [place], [place] [address]  CBC with Differential/Platelet     Status: Abnormal   Collection Time: 04/27/20  9:55 AM  Result Value Ref Range   WBC 5.7 4.0 - 10.5 K/uL   RBC 4.29 4.22 - 5.81 MIL/uL   Hemoglobin 14.0 13.0 - 17.0 g/dL   HCT 40.2 39.0 - 52.0 %   MCV 93.7 80.0 - 100.0 fL   MCH 32.6 26.0 - 34.0 pg   MCHC 34.8 30.0 - 36.0 g/dL   RDW 14.2 11.5 - 15.5 %   Platelets 139 (L) 150 - 400 K/uL    Comment: SPECIMEN CHECKED FOR CLOTS   nRBC 0.0 0.0 - 0.2 %   Neutrophils Relative % 63 %   Neutro Abs 3.6 1.7 - 7.7 K/uL   Lymphocytes Relative 23 %   Lymphs Abs 1.3 0.7 - 4.0 K/uL   Monocytes Relative 11 %   Monocytes Absolute 0.7 0.1 - 1.0 K/uL   Eosinophils Relative 3 %   Eosinophils Absolute 0.2 0.0 - 0.5 K/uL   Basophils Relative 0 %   Basophils Absolute 0.0 0.0 - 0.1 K/uL   Immature Granulocytes 0 %   Abs Immature Granulocytes 0.02 0.00 - 0.07 K/uL    Comment: Performed at [facility], [address], [place], [place] [address]    Assessment/Plan: 1. Encounter for routine adult health examination with abnormal findings Well appearing 70 year old male Will review routine fasting labs and adjust therapy  as indicated Up to date on PHM - CBC w/Diff/Platelet - Comprehensive Metabolic Panel (CMET) - Lipid Panel With LDL/HDL Ratio - TSH + free T4  2. Screening for prostate cancer - PSA, total and free  3. Essential hypertension BP and HR well controlled, requesting refills - amLODipine (NORVASC) 5  MG tablet; Take 1 tablet (5 mg total) by mouth daily.  Dispense: 90 tablet; Refill: 3  4. Right ear pain Strongly advised to contact Dr. Pryor Ochoa (ENT) for evaluation and management of right ear pain due to his recent history of malignancy  5. Atypical mole Referral to dermatology for further evaluation and possible biopsy of AK - Ambulatory referral to Dermatology  6. Chronic pain of left lower extremity Previous injury, requesting refill of muscle relaxant as this has controlled his pain in the past - tiZANidine (ZANAFLEX) 4 MG tablet; Take 1 tablet (4 mg total) by mouth every 8 (eight) hours as needed for muscle spasms.  Dispense: 30 tablet; Refill: 2  7. Other fatigue - CBC w/Diff/Platelet - Comprehensive Metabolic Panel (CMET) - Lipid Panel With LDL/HDL Ratio - TSH + free T4  8. Dysuria - UA/M w/rflx Culture, Routine  9. Thrush, oral Treat with nystatin rinse - nystatin (MYCOSTATIN) 100000 UNIT/ML suspension; Take 5 mLs (500,000 Units total) by mouth 4 (four) times daily.  Dispense: 60 mL; Refill: 0  General Counseling: Emerald verbalizes understanding of the findings of todays visit and agrees with plan of treatment. I have discussed any further diagnostic evaluation that may be needed or ordered today. We also reviewed his medications today. he has been encouraged to call the office with any questions or concerns that should arise related to todays visit.    Counseling:    Orders Placed This Encounter  Procedures  . UA/M w/rflx Culture, Routine  . CBC w/Diff/Platelet  . Comprehensive Metabolic Panel (CMET)  . Lipid Panel With LDL/HDL Ratio  . TSH + free T4  . PSA, total and free  . Ambulatory referral to Dermatology    Meds ordered this encounter  Medications  . nystatin (MYCOSTATIN) 100000 UNIT/ML suspension    Sig: Take 5 mLs (500,000 Units total) by mouth 4 (four) times daily.    Dispense:  60 mL    Refill:  0  . tiZANidine (ZANAFLEX) 4 MG tablet    Sig:  Take 1 tablet (4 mg total) by mouth every 8 (eight) hours as needed for muscle spasms.    Dispense:  30 tablet    Refill:  2  . amLODipine (NORVASC) 5 MG tablet    Sig: Take 1 tablet (5 mg total) by mouth daily.    Dispense:  90 tablet    Refill:  3    Total time spent: 30 Minutes  Time spent includes review of chart, medications, test results, and follow up plan with the patient.   This patient was seen by Theodoro Grist AGNP-C Collaboration with Dr Lavera Guise as a part of collaborative care agreement   Tanna Furry. [facility] Internal Medicine

## 2020-07-25 LAB — UA/M W/RFLX CULTURE, ROUTINE
Bilirubin, UA: NEGATIVE
Glucose, UA: NEGATIVE
Leukocytes,UA: NEGATIVE
Nitrite, UA: NEGATIVE
Protein,UA: NEGATIVE
RBC, UA: NEGATIVE
Specific Gravity, UA: 1.024 (ref 1.005–1.030)
Urobilinogen, Ur: 1 mg/dL (ref 0.2–1.0)
pH, UA: 5 (ref 5.0–7.5)

## 2020-07-25 LAB — MICROSCOPIC EXAMINATION
Bacteria, UA: NONE SEEN
Casts: NONE SEEN /lpf
Epithelial Cells (non renal): NONE SEEN /hpf (ref 0–10)

## 2020-09-05 IMAGING — CR DG CHEST 2V
1 series · 3 of 3 positions shown · non-contrast
Comparison: None.

CLINICAL DATA: Pt woke up with fever, chills and congestion this
morning. History of throat cancer, porta cath 05-20-2019, COPD, HTN,
former smoker. shieldedfever

EXAM:
CHEST - 2 VIEW

[Series 1: dg chest 2 view · 0.14mm/px · 3 of 3 slices shown]
[im 1/3]
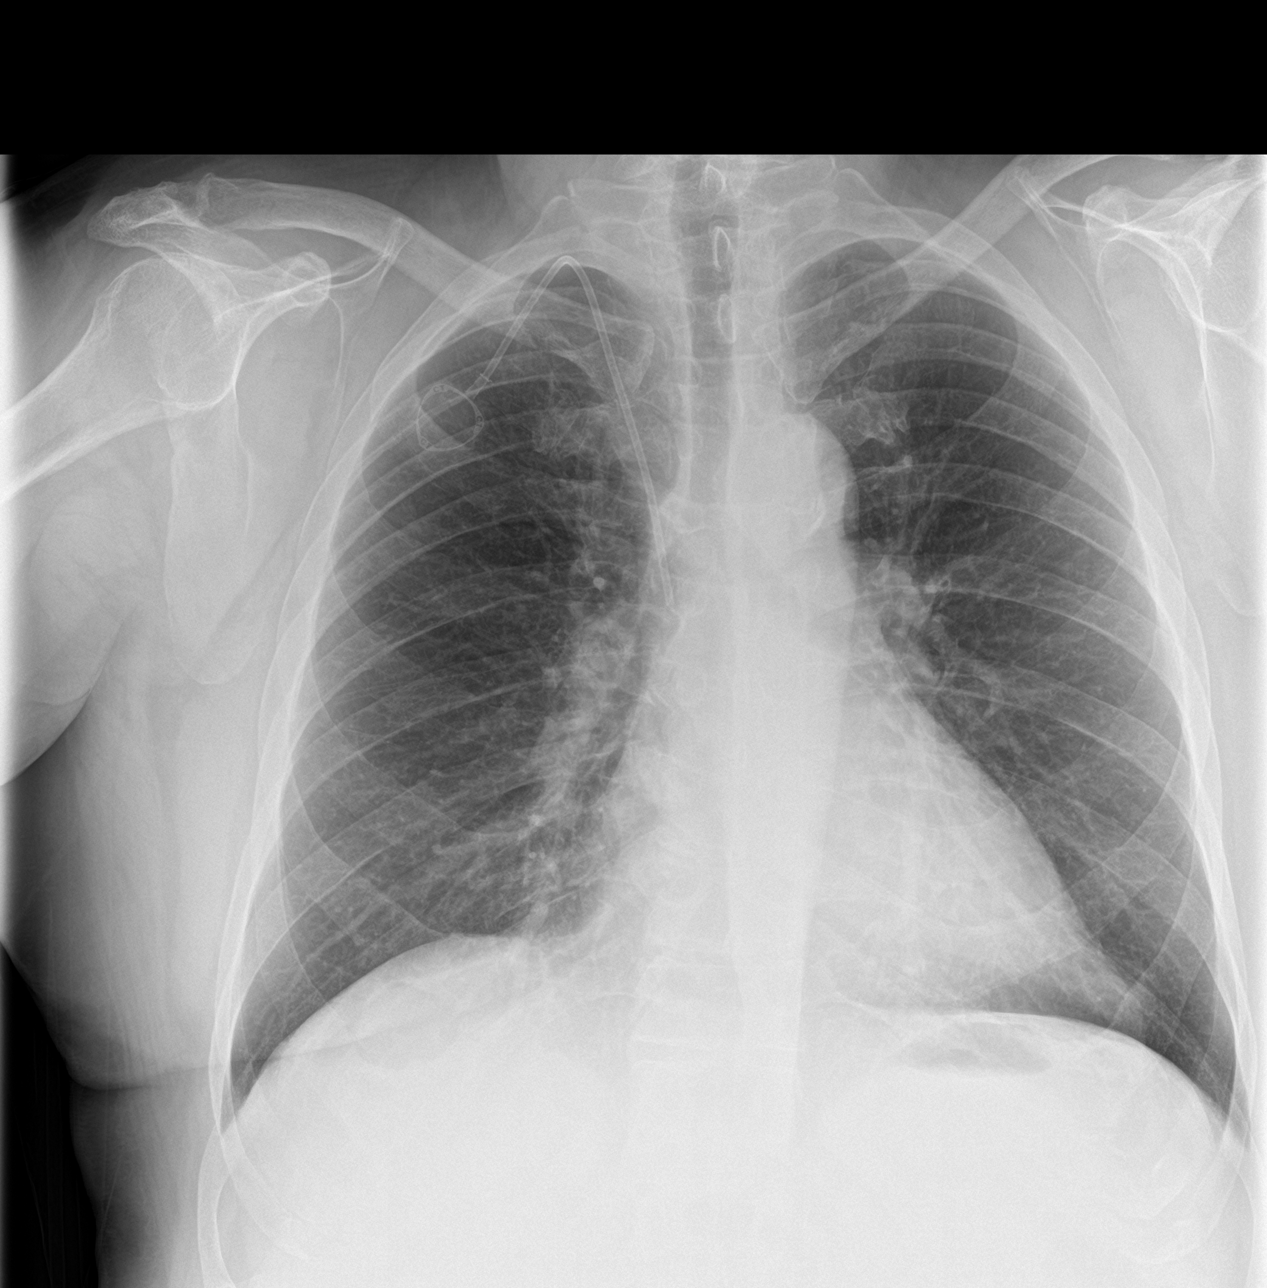
[im 2/3]
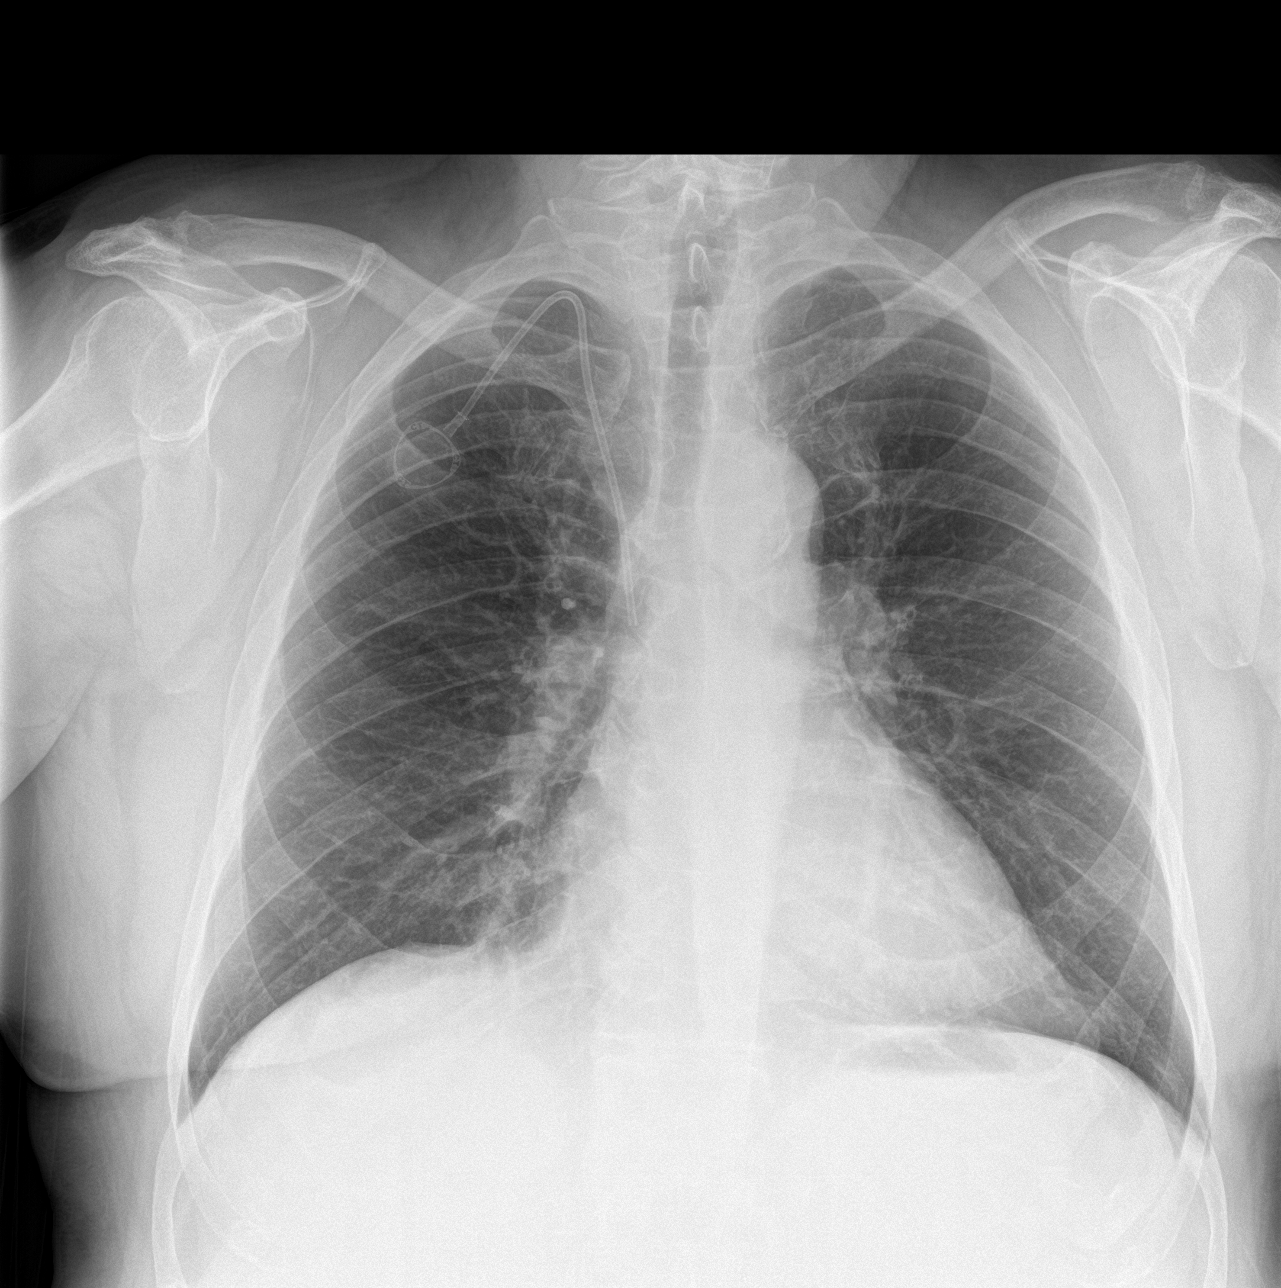
[im 3/3]
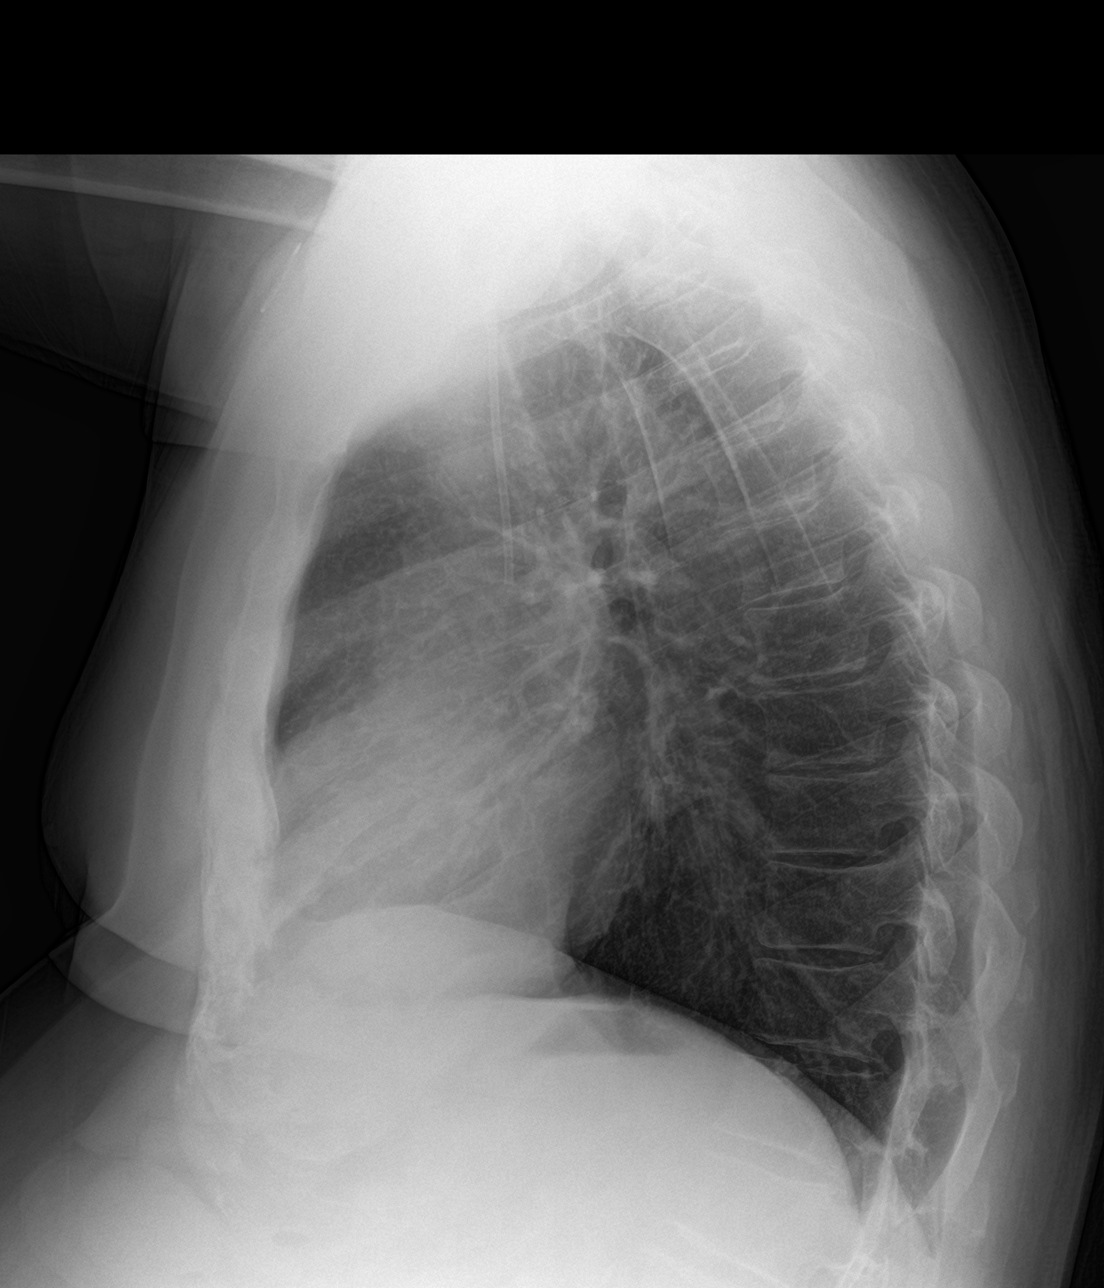

[3 of 3 positions shown; findings below may reference images not displayed]

FINDINGS: Normal mediastinum and cardiac silhouette. Normal pulmonary
vasculature. No evidence of effusion, infiltrate, or pneumothorax.
No acute bony abnormality. Port in the anterior chest wall with tip
in distal SVC.
IMPRESSION: No acute cardiopulmonary process.

## 2020-10-06 DIAGNOSIS — M26652 Arthropathy of left temporomandibular joint: Secondary | ICD-10-CM | POA: Diagnosis not present

## 2020-10-06 DIAGNOSIS — R682 Dry mouth, unspecified: Secondary | ICD-10-CM | POA: Diagnosis not present

## 2020-10-06 DIAGNOSIS — H6982 Other specified disorders of Eustachian tube, left ear: Secondary | ICD-10-CM | POA: Diagnosis not present

## 2020-10-06 DIAGNOSIS — B379 Candidiasis, unspecified: Secondary | ICD-10-CM | POA: Diagnosis not present

## 2020-10-06 DIAGNOSIS — Z8522 Personal history of malignant neoplasm of nasal cavities, middle ear, and accessory sinuses: Secondary | ICD-10-CM | POA: Diagnosis not present

## 2020-10-10 ENCOUNTER — Telehealth: Payer: Self-pay

## 2020-10-10 NOTE — Telephone Encounter (Signed)
Rcvd fax from [facility] regarding diflucan vs amlodipine. Signed by dfk. Faxed back to [phone]

## 2020-10-26 ENCOUNTER — Other Ambulatory Visit: Payer: Medicare HMO

## 2020-10-26 ENCOUNTER — Ambulatory Visit: Payer: Medicare HMO | Admitting: Oncology

## 2020-11-07 ENCOUNTER — Other Ambulatory Visit: Payer: Self-pay

## 2020-11-07 ENCOUNTER — Inpatient Hospital Stay: Payer: Medicare HMO | Attending: Oncology | Admitting: *Deleted

## 2020-11-07 ENCOUNTER — Encounter: Payer: Self-pay | Admitting: Oncology

## 2020-11-07 ENCOUNTER — Inpatient Hospital Stay (HOSPITAL_BASED_OUTPATIENT_CLINIC_OR_DEPARTMENT_OTHER): Payer: Medicare HMO | Admitting: Oncology

## 2020-11-07 VITALS — Ht 69.0 in | Wt 192.5 lb

## 2020-11-07 VITALS — BP 122/73 | HR 72 | Temp 97.7°F | Resp 20 | Wt 192.0 lb

## 2020-11-07 DIAGNOSIS — Z95828 Presence of other vascular implants and grafts: Secondary | ICD-10-CM

## 2020-11-07 DIAGNOSIS — Z818 Family history of other mental and behavioral disorders: Secondary | ICD-10-CM | POA: Diagnosis not present

## 2020-11-07 DIAGNOSIS — Z809 Family history of malignant neoplasm, unspecified: Secondary | ICD-10-CM | POA: Diagnosis not present

## 2020-11-07 DIAGNOSIS — Z08 Encounter for follow-up examination after completed treatment for malignant neoplasm: Secondary | ICD-10-CM | POA: Diagnosis not present

## 2020-11-07 DIAGNOSIS — Z841 Family history of disorders of kidney and ureter: Secondary | ICD-10-CM | POA: Diagnosis not present

## 2020-11-07 DIAGNOSIS — Z8261 Family history of arthritis: Secondary | ICD-10-CM | POA: Diagnosis not present

## 2020-11-07 DIAGNOSIS — N179 Acute kidney failure, unspecified: Secondary | ICD-10-CM | POA: Diagnosis not present

## 2020-11-07 DIAGNOSIS — Z811 Family history of alcohol abuse and dependence: Secondary | ICD-10-CM | POA: Insufficient documentation

## 2020-11-07 DIAGNOSIS — I10 Essential (primary) hypertension: Secondary | ICD-10-CM | POA: Insufficient documentation

## 2020-11-07 DIAGNOSIS — Z8589 Personal history of malignant neoplasm of other organs and systems: Secondary | ICD-10-CM | POA: Diagnosis not present

## 2020-11-07 DIAGNOSIS — C119 Malignant neoplasm of nasopharynx, unspecified: Secondary | ICD-10-CM | POA: Diagnosis not present

## 2020-11-07 DIAGNOSIS — Z8719 Personal history of other diseases of the digestive system: Secondary | ICD-10-CM | POA: Insufficient documentation

## 2020-11-07 DIAGNOSIS — G8929 Other chronic pain: Secondary | ICD-10-CM

## 2020-11-07 DIAGNOSIS — Z882 Allergy status to sulfonamides status: Secondary | ICD-10-CM | POA: Diagnosis not present

## 2020-11-07 DIAGNOSIS — Z923 Personal history of irradiation: Secondary | ICD-10-CM | POA: Insufficient documentation

## 2020-11-07 DIAGNOSIS — Z880 Allergy status to penicillin: Secondary | ICD-10-CM | POA: Diagnosis not present

## 2020-11-07 DIAGNOSIS — Z8249 Family history of ischemic heart disease and other diseases of the circulatory system: Secondary | ICD-10-CM | POA: Diagnosis not present

## 2020-11-07 DIAGNOSIS — Z79899 Other long term (current) drug therapy: Secondary | ICD-10-CM | POA: Insufficient documentation

## 2020-11-07 DIAGNOSIS — Z87891 Personal history of nicotine dependence: Secondary | ICD-10-CM | POA: Insufficient documentation

## 2020-11-07 DIAGNOSIS — Z885 Allergy status to narcotic agent status: Secondary | ICD-10-CM | POA: Diagnosis not present

## 2020-11-07 LAB — COMPREHENSIVE METABOLIC PANEL
ALT: 16 U/L (ref 0–44)
AST: 20 U/L (ref 15–41)
Albumin: 4.1 g/dL (ref 3.5–5.0)
Alkaline Phosphatase: 56 U/L (ref 38–126)
Anion gap: 9 (ref 5–15)
BUN: 13 mg/dL (ref 8–23)
CO2: 26 mmol/L (ref 22–32)
Calcium: 8.7 mg/dL — ABNORMAL LOW (ref 8.9–10.3)
Chloride: 100 mmol/L (ref 98–111)
Creatinine, Ser: 1.27 mg/dL — ABNORMAL HIGH (ref 0.61–1.24)
GFR, Estimated: >60 mL/min (ref >60)
Glucose, Bld: 108 mg/dL — ABNORMAL HIGH (ref 70–99)
Potassium: 4.1 mmol/L (ref 3.5–5.1)
Sodium: 135 mmol/L (ref 135–145)
Total Bilirubin: 0.5 mg/dL (ref 0.3–1.2)
Total Protein: 7.1 g/dL (ref 6.5–8.1)

## 2020-11-07 LAB — CBC WITH DIFFERENTIAL/PLATELET
Abs Immature Granulocytes: 0.01 10*3/uL (ref 0.00–0.07)
Basophils Absolute: 0 10*3/uL (ref 0.0–0.1)
Basophils Relative: 0 %
Eosinophils Absolute: 0.1 10*3/uL (ref 0.0–0.5)
Eosinophils Relative: 2 %
HCT: 42.9 % (ref 39.0–52.0)
Hemoglobin: 14.9 g/dL (ref 13.0–17.0)
Immature Granulocytes: 0 %
Lymphocytes Relative: 24 %
Lymphs Abs: 1.4 10*3/uL (ref 0.7–4.0)
MCH: 31.8 pg (ref 26.0–34.0)
MCHC: 34.7 g/dL (ref 30.0–36.0)
MCV: 91.7 fL (ref 80.0–100.0)
Monocytes Absolute: 0.6 10*3/uL (ref 0.1–1.0)
Monocytes Relative: 9 %
Neutro Abs: 3.8 10*3/uL (ref 1.7–7.7)
Neutrophils Relative %: 65 %
Platelets: 154 10*3/uL (ref 150–400)
RBC: 4.68 MIL/uL (ref 4.22–5.81)
RDW: 13.4 % (ref 11.5–15.5)
WBC: 6 10*3/uL (ref 4.0–10.5)
nRBC: 0 % (ref 0.0–0.2)

## 2020-11-07 MED ORDER — SODIUM CHLORIDE 0.9% FLUSH
10.0000 mL | Freq: Once | INTRAVENOUS | Status: AC
Start: 2020-11-07 — End: 2020-11-07
  Administered 2020-11-07: 10 mL via INTRAVENOUS
  Filled 2020-11-07: qty 10

## 2020-11-07 MED ORDER — HEPARIN SOD (PORK) LOCK FLUSH 100 UNIT/ML IV SOLN
500.0000 [IU] | Freq: Once | INTRAVENOUS | Status: AC
  Administered 2020-11-07: 500 [IU] via INTRAVENOUS
  Filled 2020-11-07: qty 5

## 2020-11-07 NOTE — Progress Notes (Signed)
Add on port flush per patient's request. RN reviewed chart. Port a cath last accessed by infusion RN since November 2021.  Blood return sluggish initially. Labs collected via port after multiple attempts of flushing with NS.

## 2020-11-11 ENCOUNTER — Encounter: Payer: Self-pay | Admitting: Oncology

## 2020-11-11 NOTE — Progress Notes (Signed)
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  Pryor Ochoa for symptoms of ear pain and discharge.  Patient also noticed associated hearing loss and tinnitus.  Patient was noted to have erythema and edema in the left sphenopalatine fossa as well as an exophytic mass on NPL exam.  CT soft tissue of the neck showed asymmetric soft tissue within the left nasopharynx measuring 2.4 x 2 x 3.2 cm.  There is lateral bulging with encroachment upon the left parapharyngeal space without frank infiltration.  No extension into oropharynx or nasal cavity.  No skull base infiltration.  No appreciable mass within the larynx.  No pathologically enlarged cervical lymph nodes.  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  He was only able to receive 6 doses of weekly cisplatin 40 mg due to cytopenias and AKI     Interval history- Patient is doing well presently. Denies any difficulty swallowing. He gained a lot of weight after chemo/RT. He is now focused on losing some intentionally  ECOG PS- 1 Pain  scale- 0   Review of systems- Review of Systems  Constitutional:  Negative for chills, fever, malaise/fatigue and weight loss.  HENT:  Negative for congestion, ear discharge and nosebleeds.   Eyes:  Negative for blurred vision.  Respiratory:  Negative for cough, hemoptysis, sputum production, shortness of breath and wheezing.   Cardiovascular:  Negative for chest pain, palpitations, orthopnea and claudication.  Gastrointestinal:  Negative for abdominal pain, blood in stool, constipation, diarrhea, heartburn, melena, nausea and vomiting.  Genitourinary:  Negative for dysuria, flank pain, frequency, hematuria and urgency.  Musculoskeletal:  Negative for back pain, joint pain and myalgias.  Skin:  Negative for rash.  Neurological:  Negative for dizziness, tingling, focal weakness, seizures, weakness and headaches.  Endo/Heme/Allergies:  Does not bruise/bleed easily.  Psychiatric/Behavioral:  Negative for depression and suicidal ideas. The patient does not have insomnia.      Allergies  Allergen Reactions   Opana [Oxymorphone Hcl]     Made him BLACKOUT   Amoxicillin Other (See Comments)   Amoxicillin    Elemental Sulfur     Childhood reaction    Oxymorphone Other (See Comments)   Oxymorphone    Sulfa Antibiotics Other (See Comments)   Sulfa Antibiotics      Past Medical History:  Diagnosis Date   Alcohol abuse    Benzodiazepine dependence ([place])    Benzodiazepine withdrawal ([facility])    Chronic pain in right foot    COPD (chronic obstructive pulmonary disease) ([place])    Depression 08/19/2017   Depression    Hepatitis C    Hypertension    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[place])  Seizures ([place]) 2010   Seizures ([place])    Sleep apnea    Syncope    questionable vasovagal   Syncope      Past Surgical History:  Procedure Laterality Date   COLONOSCOPY WITH PROPOFOL N/A 12/16/2016   Procedure: COLONOSCOPY WITH PROPOFOL;  Surgeon: Lollie Sails, MD;  Location: [facility] ENDOSCOPY;  Service:  Endoscopy;  Laterality: N/A;   COLONOSCOPY WITH PROPOFOL N/A 03/14/2017   Procedure: COLONOSCOPY WITH PROPOFOL;  Surgeon: Lollie Sails, MD;  Location: [facility] ENDOSCOPY;  Service: Endoscopy;  Laterality: N/A;   COLONOSCOPY WITH PROPOFOL     FOOT SURGERY Right 03/12/2002   FOOT SURGERY     HEMORRHOID SURGERY     LITHOTRIPSY     MYRINGOTOMY     MYRINGOTOMY WITH TUBE PLACEMENT Left 04/28/2019   Procedure: MYRINGOTOMY WITH TUBE PLACEMENT;  Surgeon: Carloyn Manner, MD;  Location: [place];  Service: ENT;  Laterality: Left;   NASOPHARYNGOSCOPY N/A 04/28/2019   Procedure: NASOPHARYNGOSCOPY WITH BIOPSY;  Surgeon: Carloyn Manner, MD;  Location: [place];  Service: ENT;  Laterality: N/A;   NASOPHARYNGOSCOPY     Port a cath Placement     PORTA CATH INSERTION N/A 05/20/2019   Procedure: PORTA CATH INSERTION;  Surgeon: Algernon Huxley, MD;  Location: [place] CV LAB;  Service: Cardiovascular;  Laterality: N/A;   TONSILLECTOMY      Social History   Socioeconomic History   Marital status: Married    Spouse name: Not on file   Number of children: Not on file   Years of education: Not on file   Highest education level: Not on file  Occupational History   Not on file  Tobacco Use   Smoking status: Former    Packs/day: 0.50    Years: 50.00    Pack years: 25.00    Types: Cigarettes    Quit date: 02/05/2019    Years since quitting: 1.7   Smokeless tobacco: Former  Scientific laboratory technician Use: Never used  Substance and Sexual Activity   Alcohol use: Not Currently   Drug use: Not Currently   Sexual activity: Not Currently  Other Topics Concern   Not on file  Social History Narrative   ** Merged History Encounter **       Social Determinants of Health   Financial Resource Strain: Not on file  Food Insecurity: Not on file  Transportation Needs: Not on file  Physical Activity: Not on file  Stress: Not on file  Social Connections: Not on file  Intimate Partner  Violence: Not on file    Family History  Problem Relation Age of Onset   Arthritis Mother    Hypertension Mother    Cancer Father    Kidney disease Father    Alcohol abuse Paternal Aunt    Depression Maternal Grandfather      Current Outpatient Medications:    amLODipine (NORVASC) 5 MG tablet, Take 1 tablet (5 mg total) by mouth daily., Disp: 90 tablet, Rfl: 3   antiseptic oral rinse (BIOTENE) LIQD, 15 mLs by Mouth Rinse route as needed for dry mouth., Disp: , Rfl:    aspirin EC 81 MG tablet, Take 81 mg by mouth daily. Swallow whole., Disp: , Rfl:    Multiple Vitamin (MULTIVITAMIN WITH MINERALS) TABS tablet, Take 1 tablet by mouth daily., Disp: , Rfl:  No current facility-administered medications for this visit.  Facility-Administered Medications Ordered in Other Visits:    heparin lock flush 100  unit/mL, 500 Units, Intravenous, Once, Sindy Guadeloupe, MD  Physical exam:  Vitals:   11/07/20 1442  BP: 122/73  Pulse: 72  Resp: 20  Temp: 97.7 F (36.5 C)  TempSrc: Tympanic  Weight: 192 lb (87.1 kg)   Physical Exam HENT:     Mouth/Throat:     Mouth: Mucous membranes are moist.     Pharynx: Oropharynx is clear.  Cardiovascular:     Rate and Rhythm: Normal rate and regular rhythm.     Heart sounds: Normal heart sounds.  Pulmonary:     Effort: Pulmonary effort is normal.     Breath sounds: Normal breath sounds.  Abdominal:     General: Bowel sounds are normal.     Palpations: Abdomen is soft.  Skin:    General: Skin is warm and dry.  Neurological:     Mental Status: He is alert and oriented to person, place, and time.     CMP Latest Ref Rng & Units 11/07/2020  Glucose 70 - 99 mg/dL 108(H)  BUN 8 - 23 mg/dL 13  Creatinine 0.61 - 1.24 mg/dL 1.27(H)  Sodium 135 - 145 mmol/L 135  Potassium 3.5 - 5.1 mmol/L 4.1  Chloride 98 - 111 mmol/L 100  CO2 22 - 32 mmol/L 26  Calcium 8.9 - 10.3 mg/dL 8.7(L)  Total Protein 6.5 - 8.1 g/dL 7.1  Total Bilirubin 0.3 - 1.2 mg/dL 0.5   Alkaline Phos 38 - 126 U/L 56  AST 15 - 41 U/L 20  ALT 0 - 44 U/L 16   CBC Latest Ref Rng & Units 11/07/2020  WBC 4.0 - 10.5 K/uL 6.0  Hemoglobin 13.0 - 17.0 g/dL 14.9  Hematocrit 39.0 - 52.0 % 42.9  Platelets 150 - 400 K/uL 154      Assessment and plan- Patient is a 70 y.o. 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 This is a routine f/u visit  Clinically patient is doing well with no concerning signs/ symptoms of recurrence on todays exam. He follows up with Dr. Pryor Ochoa as well. I will see him in 6 months   Visit Diagnosis 1. Encounter for follow-up surveillance of head and neck cancer      Dr. Randa Evens, MD, MPH [facility] at [facility] [phone] 11/11/2020 7:22 PM

## 2020-11-11 NOTE — H&P (View-Only) (Signed)
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  Pryor Ochoa for symptoms of ear pain and discharge.  Patient also noticed associated hearing loss and tinnitus.  Patient was noted to have erythema and edema in the left sphenopalatine fossa as well as an exophytic mass on NPL exam.  CT soft tissue of the neck showed asymmetric soft tissue within the left nasopharynx measuring 2.4 x 2 x 3.2 cm.  There is lateral bulging with encroachment upon the left parapharyngeal space without frank infiltration.  No extension into oropharynx or nasal cavity.  No skull base infiltration.  No appreciable mass within the larynx.  No pathologically enlarged cervical lymph nodes.  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  He was only able to receive 6 doses of weekly cisplatin 40 mg due to cytopenias and AKI     Interval history- Patient is doing well presently. Denies any difficulty swallowing. He gained a lot of weight after chemo/RT. He is now focused on losing some intentionally  ECOG PS- 1 Pain  scale- 0   Review of systems- Review of Systems  Constitutional:  Negative for chills, fever, malaise/fatigue and weight loss.  HENT:  Negative for congestion, ear discharge and nosebleeds.   Eyes:  Negative for blurred vision.  Respiratory:  Negative for cough, hemoptysis, sputum production, shortness of breath and wheezing.   Cardiovascular:  Negative for chest pain, palpitations, orthopnea and claudication.  Gastrointestinal:  Negative for abdominal pain, blood in stool, constipation, diarrhea, heartburn, melena, nausea and vomiting.  Genitourinary:  Negative for dysuria, flank pain, frequency, hematuria and urgency.  Musculoskeletal:  Negative for back pain, joint pain and myalgias.  Skin:  Negative for rash.  Neurological:  Negative for dizziness, tingling, focal weakness, seizures, weakness and headaches.  Endo/Heme/Allergies:  Does not bruise/bleed easily.  Psychiatric/Behavioral:  Negative for depression and suicidal ideas. The patient does not have insomnia.      Allergies  Allergen Reactions   Opana [Oxymorphone Hcl]     Made him BLACKOUT   Amoxicillin Other (See Comments)   Amoxicillin    Elemental Sulfur     Childhood reaction    Oxymorphone Other (See Comments)   Oxymorphone    Sulfa Antibiotics Other (See Comments)   Sulfa Antibiotics      Past Medical History:  Diagnosis Date   Alcohol abuse    Benzodiazepine dependence ([place])    Benzodiazepine withdrawal ([facility])    Chronic pain in right foot    COPD (chronic obstructive pulmonary disease) ([place])    Depression 08/19/2017   Depression    Hepatitis C    Hypertension    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[place])  Seizures ([place]) 2010   Seizures ([place])    Sleep apnea    Syncope    questionable vasovagal   Syncope      Past Surgical History:  Procedure Laterality Date   COLONOSCOPY WITH PROPOFOL N/A 12/16/2016   Procedure: COLONOSCOPY WITH PROPOFOL;  Surgeon: Lollie Sails, MD;  Location: [facility] ENDOSCOPY;  Service:  Endoscopy;  Laterality: N/A;   COLONOSCOPY WITH PROPOFOL N/A 03/14/2017   Procedure: COLONOSCOPY WITH PROPOFOL;  Surgeon: Lollie Sails, MD;  Location: [facility] ENDOSCOPY;  Service: Endoscopy;  Laterality: N/A;   COLONOSCOPY WITH PROPOFOL     FOOT SURGERY Right 03/12/2002   FOOT SURGERY     HEMORRHOID SURGERY     LITHOTRIPSY     MYRINGOTOMY     MYRINGOTOMY WITH TUBE PLACEMENT Left 04/28/2019   Procedure: MYRINGOTOMY WITH TUBE PLACEMENT;  Surgeon: Carloyn Manner, MD;  Location: [place];  Service: ENT;  Laterality: Left;   NASOPHARYNGOSCOPY N/A 04/28/2019   Procedure: NASOPHARYNGOSCOPY WITH BIOPSY;  Surgeon: Carloyn Manner, MD;  Location: [place];  Service: ENT;  Laterality: N/A;   NASOPHARYNGOSCOPY     Port a cath Placement     PORTA CATH INSERTION N/A 05/20/2019   Procedure: PORTA CATH INSERTION;  Surgeon: Algernon Huxley, MD;  Location: [place] CV LAB;  Service: Cardiovascular;  Laterality: N/A;   TONSILLECTOMY      Social History   Socioeconomic History   Marital status: Married    Spouse name: Not on file   Number of children: Not on file   Years of education: Not on file   Highest education level: Not on file  Occupational History   Not on file  Tobacco Use   Smoking status: Former    Packs/day: 0.50    Years: 50.00    Pack years: 25.00    Types: Cigarettes    Quit date: 02/05/2019    Years since quitting: 1.7   Smokeless tobacco: Former  Scientific laboratory technician Use: Never used  Substance and Sexual Activity   Alcohol use: Not Currently   Drug use: Not Currently   Sexual activity: Not Currently  Other Topics Concern   Not on file  Social History Narrative   ** Merged History Encounter **       Social Determinants of Health   Financial Resource Strain: Not on file  Food Insecurity: Not on file  Transportation Needs: Not on file  Physical Activity: Not on file  Stress: Not on file  Social Connections: Not on file  Intimate Partner  Violence: Not on file    Family History  Problem Relation Age of Onset   Arthritis Mother    Hypertension Mother    Cancer Father    Kidney disease Father    Alcohol abuse Paternal Aunt    Depression Maternal Grandfather      Current Outpatient Medications:    amLODipine (NORVASC) 5 MG tablet, Take 1 tablet (5 mg total) by mouth daily., Disp: 90 tablet, Rfl: 3   antiseptic oral rinse (BIOTENE) LIQD, 15 mLs by Mouth Rinse route as needed for dry mouth., Disp: , Rfl:    aspirin EC 81 MG tablet, Take 81 mg by mouth daily. Swallow whole., Disp: , Rfl:    Multiple Vitamin (MULTIVITAMIN WITH MINERALS) TABS tablet, Take 1 tablet by mouth daily., Disp: , Rfl:  No current facility-administered medications for this visit.  Facility-Administered Medications Ordered in Other Visits:    heparin lock flush 100  unit/mL, 500 Units, Intravenous, Once, Sindy Guadeloupe, MD  Physical exam:  Vitals:   11/07/20 1442  BP: 122/73  Pulse: 72  Resp: 20  Temp: 97.7 F (36.5 C)  TempSrc: Tympanic  Weight: 192 lb (87.1 kg)   Physical Exam HENT:     Mouth/Throat:     Mouth: Mucous membranes are moist.     Pharynx: Oropharynx is clear.  Cardiovascular:     Rate and Rhythm: Normal rate and regular rhythm.     Heart sounds: Normal heart sounds.  Pulmonary:     Effort: Pulmonary effort is normal.     Breath sounds: Normal breath sounds.  Abdominal:     General: Bowel sounds are normal.     Palpations: Abdomen is soft.  Skin:    General: Skin is warm and dry.  Neurological:     Mental Status: He is alert and oriented to person, place, and time.     CMP Latest Ref Rng & Units 11/07/2020  Glucose 70 - 99 mg/dL 108(H)  BUN 8 - 23 mg/dL 13  Creatinine 0.61 - 1.24 mg/dL 1.27(H)  Sodium 135 - 145 mmol/L 135  Potassium 3.5 - 5.1 mmol/L 4.1  Chloride 98 - 111 mmol/L 100  CO2 22 - 32 mmol/L 26  Calcium 8.9 - 10.3 mg/dL 8.7(L)  Total Protein 6.5 - 8.1 g/dL 7.1  Total Bilirubin 0.3 - 1.2 mg/dL 0.5   Alkaline Phos 38 - 126 U/L 56  AST 15 - 41 U/L 20  ALT 0 - 44 U/L 16   CBC Latest Ref Rng & Units 11/07/2020  WBC 4.0 - 10.5 K/uL 6.0  Hemoglobin 13.0 - 17.0 g/dL 14.9  Hematocrit 39.0 - 52.0 % 42.9  Platelets 150 - 400 K/uL 154      Assessment and plan- Patient is a 70 y.o. 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 This is a routine f/u visit  Clinically patient is doing well with no concerning signs/ symptoms of recurrence on todays exam. He follows up with Dr. Pryor Ochoa as well. I will see him in 6 months   Visit Diagnosis 1. Encounter for follow-up surveillance of head and neck cancer      Dr. Randa Evens, MD, MPH [facility] at [facility] [phone] 11/11/2020 7:22 PM

## 2020-11-21 ENCOUNTER — Encounter: Payer: Self-pay | Admitting: Oncology

## 2020-11-21 DIAGNOSIS — Z95828 Presence of other vascular implants and grafts: Secondary | ICD-10-CM

## 2020-11-22 ENCOUNTER — Telehealth: Payer: Self-pay

## 2020-11-22 ENCOUNTER — Other Ambulatory Visit: Payer: Self-pay

## 2020-11-22 ENCOUNTER — Ambulatory Visit
Admission: RE | Admit: 2020-11-22 | Discharge: 2020-11-22 | Disposition: A | Discharge status: Home / Self Care | Payer: Medicare HMO | Source: Ambulatory Visit | Attending: Oncology | Admitting: Oncology

## 2020-11-22 DIAGNOSIS — I700 Atherosclerosis of aorta: Secondary | ICD-10-CM | POA: Diagnosis not present

## 2020-11-22 DIAGNOSIS — Z08 Encounter for follow-up examination after completed treatment for malignant neoplasm: Secondary | ICD-10-CM | POA: Insufficient documentation

## 2020-11-22 DIAGNOSIS — Z8589 Personal history of malignant neoplasm of other organs and systems: Secondary | ICD-10-CM | POA: Insufficient documentation

## 2020-11-22 DIAGNOSIS — J439 Emphysema, unspecified: Secondary | ICD-10-CM | POA: Diagnosis not present

## 2020-11-22 NOTE — Telephone Encounter (Signed)
Left vm to screen for 11/23/20 appointment-Toni

## 2020-11-23 ENCOUNTER — Ambulatory Visit: Payer: Medicare HMO | Admitting: Physician Assistant

## 2020-11-24 ENCOUNTER — Telehealth (INDEPENDENT_AMBULATORY_CARE_PROVIDER_SITE_OTHER): Payer: Self-pay

## 2020-11-24 NOTE — Telephone Encounter (Signed)
I attempted to contact the patient to schedule him for a port removal and a message was left for a return call.

## 2020-11-27 ENCOUNTER — Other Ambulatory Visit: Payer: Self-pay

## 2020-11-27 ENCOUNTER — Ambulatory Visit (INDEPENDENT_AMBULATORY_CARE_PROVIDER_SITE_OTHER): Payer: Medicare HMO | Admitting: Physician Assistant

## 2020-11-27 ENCOUNTER — Encounter: Payer: Self-pay | Admitting: Physician Assistant

## 2020-11-27 DIAGNOSIS — M5441 Lumbago with sciatica, right side: Secondary | ICD-10-CM

## 2020-11-27 DIAGNOSIS — M5442 Lumbago with sciatica, left side: Secondary | ICD-10-CM | POA: Diagnosis not present

## 2020-11-27 DIAGNOSIS — R7989 Other specified abnormal findings of blood chemistry: Secondary | ICD-10-CM | POA: Diagnosis not present

## 2020-11-27 DIAGNOSIS — R0989 Other specified symptoms and signs involving the circulatory and respiratory systems: Secondary | ICD-10-CM

## 2020-11-27 DIAGNOSIS — I700 Atherosclerosis of aorta: Secondary | ICD-10-CM

## 2020-11-27 DIAGNOSIS — J439 Emphysema, unspecified: Secondary | ICD-10-CM

## 2020-11-27 DIAGNOSIS — C119 Malignant neoplasm of nasopharynx, unspecified: Secondary | ICD-10-CM

## 2020-11-27 DIAGNOSIS — I10 Essential (primary) hypertension: Secondary | ICD-10-CM

## 2020-11-27 MED ORDER — TIZANIDINE HCL 4 MG PO TABS: 4.0000 mg | ORAL_TABLET | Freq: Four times a day (QID) | ORAL | 0 refills | Status: DC | PRN

## 2020-11-27 MED ORDER — TETANUS-DIPHTH-ACELL PERTUSSIS 5-2.5-18.5 LF-MCG/0.5 IM SUSY: 0.5000 mL | PREFILLED_SYRINGE | Freq: Once | INTRAMUSCULAR | 0 refills | Status: AC

## 2020-11-27 MED ORDER — ROSUVASTATIN CALCIUM 5 MG PO TABS
5.0000 mg | ORAL_TABLET | Freq: Every day | ORAL | 3 refills | Status: DC
Start: 2020-11-27 — End: 2021-10-05

## 2020-11-27 MED ORDER — ZOSTER VAC RECOMB ADJUVANTED 50 MCG/0.5ML IM SUSR: 0.5000 mL | Freq: Once | INTRAMUSCULAR | 0 refills | Status: AC

## 2020-11-27 NOTE — Progress Notes (Signed)
[facility] [place], [place] [address]  Internal MEDICINE  Office Visit Note  Patient Name: [name]  [id number]  [id number]  Date of Service: 12/03/2020  Chief Complaint  Patient presents with   Follow-up    Med review   Quality Metric Gaps    Shingrix vaccine, tdap    HPI Pt is here for routine follow up --Gets back spasms frequently, only thing that helps is muscle relaxer --Hx of nasopharyngeal carcinoma --Had a follow up CT via oncology: IMPRESSION: 1. No acute process or evidence of metastatic disease in the chest. 2. Suspicion of mild cirrhosis, as before. 3. Aortic Atherosclerosis (ICD10-I70.0) and Emphysema (ICD10-J43.9). Coronary artery atherosclerosis.  -Stopped smoking in 2020, no breathing problems -Discussed starting statin due to CAD and aortic atherosclerosis and will order carotid US for further eval -Has appt with Derm in Sept -Will go for labs now, did have cbc and cmp via oncology and found to have elevated creatinine and low calcium--will monitor and supplement calcium. Discussed may need renal US if renal function remains abnormal  Current Medication: Outpatient Encounter Medications as of 11/27/2020  Medication Sig   amLODipine (NORVASC) 5 MG tablet Take 1 tablet (5 mg total) by mouth daily.   antiseptic oral rinse (BIOTENE) LIQD 15 mLs by Mouth Rinse route as needed for dry mouth.   aspirin EC 81 MG tablet Take 81 mg by mouth daily. Swallow whole.   Multiple Vitamin (MULTIVITAMIN WITH MINERALS) TABS tablet Take 1 tablet by mouth daily.   rosuvastatin (CRESTOR) 5 MG tablet Take 1 tablet (5 mg total) by mouth daily. (Patient not taking: Reported on 11/29/2020)   tiZANidine (ZANAFLEX) 4 MG tablet Take 1 tablet (4 mg total) by mouth every 6 (six) hours as needed for muscle spasms.   [DISCONTINUED] Tdap (BOOSTRIX) 5-2.5-18.5 LF-MCG/0.5 injection Inject 0.5 mLs into the muscle once.   [DISCONTINUED] Zoster Vaccine  Adjuvanted [facility]) injection Inject 0.5 mLs into the muscle once.   Facility-Administered Encounter Medications as of 11/27/2020  Medication   heparin lock flush 100 unit/mL    Surgical History: Past Surgical History:  Procedure Laterality Date   COLONOSCOPY WITH PROPOFOL N/A 12/16/2016   Procedure: COLONOSCOPY WITH PROPOFOL;  Surgeon: Lollie Sails, MD;  Location[facility] ENDOSCOPY;  Service: Endoscopy;  Laterality: N/A;   COLONOSCOPY WITH PROPOFOL N/A 03/14/2017   Procedure: COLONOSCOPY WITH PROPOFOL;  Surgeon: Lollie Sails, MD;  Location: [facility] ENDOSCOPY;  Service: Endoscopy;  Laterality: N/A;   COLONOSCOPY WITH PROPOFOL     FOOT SURGERY Right 03/12/2002   FOOT SURGERY     HEMORRHOID SURGERY     LITHOTRIPSY     MYRINGOTOMY     MYRINGOTOMY WITH TUBE PLACEMENT Left 04/28/2019   Procedure: MYRINGOTOMY WITH TUBE PLACEMENT;  Surgeon: Carloyn Manner, MD;  Location: [place];  Service: ENT;  Laterality: Left;   NASOPHARYNGOSCOPY N/A 04/28/2019   Procedure: NASOPHARYNGOSCOPY WITH BIOPSY;  Surgeon: Carloyn Manner, MD;  Location: [place];  Service: ENT;  Laterality: N/A;   NASOPHARYNGOSCOPY     Port a cath Placement     PORTA CATH INSERTION N/A 05/20/2019   Procedure: PORTA CATH INSERTION;  Surgeon: Algernon Huxley, MD;  Location: [place] [facility];  Service: Cardiovascular;  Laterality: N/A;   PORTA CATH REMOVAL N/A 11/29/2020   Procedure: PORTA CATH REMOVAL;  Surgeon: Algernon Huxley, MD;  Location: [place] [facility];  Service: Cardiovascular;  Laterality: N/A;   TONSILLECTOMY  Medical History: Past Medical History:  Diagnosis Date   Alcohol abuse    Benzodiazepine dependence (HCC)    Benzodiazepine withdrawal (HCC)    Chronic pain in right foot    COPD (chronic obstructive pulmonary disease) ([place])    Depression 08/19/2017   Depression    Hepatitis C    Hypertension    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HCC)    Seizures ([place]) 2010   Seizures (HCC)     Sleep apnea    Syncope    questionable vasovagal   Syncope     Family History: Family History  Problem Relation Age of Onset   Arthritis Mother    Hypertension Mother    Cancer Father    Kidney disease Father    Alcohol abuse Paternal Aunt    Depression Maternal Grandfather     Social History   Socioeconomic History   Marital status: Married    Spouse name: Not on file   Number of children: Not on file   Years of education: Not on file   Highest education level: Not on file  Occupational History   Not on file  Tobacco Use   Smoking status: Former    Packs/day: 0.50    Years: 50.00    Pack years: 25.00    Types: Cigarettes    Quit date: 02/05/2019    Years since quitting: 1.8   Smokeless tobacco: Former  Scientific laboratory technician Use: Never used  Substance and Sexual Activity   Alcohol use: Not Currently   Drug use: Not Currently   Sexual activity: Not Currently  Other Topics Concern   Not on file  Social History Narrative   ** Merged History Encounter **       Social Determinants of Health   Financial Resource Strain: Not on file  Food Insecurity: Not on file  Transportation Needs: Not on file  Physical Activity: Not on file  Stress: Not on file  Social Connections: Not on file  Intimate Partner Violence: Not on file      Review of Systems  Constitutional:  Negative for chills, fatigue and unexpected weight change.  HENT:  Negative for congestion, postnasal drip, rhinorrhea, sneezing and sore throat.   Eyes:  Negative for redness.  Respiratory:  Negative for cough, chest tightness and shortness of breath.   Cardiovascular:  Negative for chest pain and palpitations.  Gastrointestinal:  Negative for abdominal pain, constipation, diarrhea, nausea and vomiting.  Genitourinary:  Negative for dysuria and frequency.  Musculoskeletal:  Positive for arthralgias and back pain. Negative for joint swelling and neck pain.  Skin:  Negative for rash.  Neurological:  Negative.  Negative for tremors and numbness.  Hematological:  Negative for adenopathy. Does not bruise/bleed easily.  Psychiatric/Behavioral:  Negative for behavioral problems (Depression), sleep disturbance and suicidal ideas. The patient is not nervous/anxious.    Vital Signs: BP 110/76   Pulse 76   Temp 97.9 F (36.6 C)   Resp 16   Ht 5\' 10"  (1.778 m)   Wt 194 lb (88 kg)   SpO2 96%   BMI 27.84 kg/m    Physical Exam Vitals and nursing note reviewed.  Constitutional:      General: He is not in acute distress.    Appearance: He is well-developed. He is not diaphoretic.  HENT:     Head: Normocephalic and atraumatic.     Mouth/Throat:     Pharynx: No oropharyngeal exudate.  Eyes:     Pupils: Pupils are  equal, round, and reactive to light.  Neck:     Thyroid: No thyromegaly.     Vascular: No JVD.     Trachea: No tracheal deviation.  Cardiovascular:     Rate and Rhythm: Normal rate and regular rhythm.     Heart sounds: Normal heart sounds. No murmur heard.   No friction rub. No gallop.  Pulmonary:     Effort: Pulmonary effort is normal. No respiratory distress.     Breath sounds: No wheezing or rales.  Chest:     Chest wall: No tenderness.  Abdominal:     General: Bowel sounds are normal.     Palpations: Abdomen is soft.  Musculoskeletal:        General: Normal range of motion.     Cervical back: Normal range of motion and neck supple.  Lymphadenopathy:     Cervical: No cervical adenopathy.  Skin:    General: Skin is warm and dry.  Neurological:     Mental Status: He is alert and oriented to person, place, and time.     Cranial Nerves: No cranial nerve deficit.  Psychiatric:        Behavior: Behavior normal.        Thought Content: Thought content normal.        Judgment: Judgment normal.       Assessment/Plan: 1. Essential hypertension Stable, continue amlodipine  2. Aortic atherosclerosis ([place]) Seen on recent CT, will start on Crestor - rosuvastatin  (CRESTOR) 5 MG tablet; Take 1 tablet (5 mg total) by mouth daily. (Patient not taking: Reported on 11/29/2020)  Dispense: 90 tablet; Refill: 3  3. Bilateral carotid bruits Will obtain carotid ultrasound for further evaluation - US Carotid Duplex Bilateral; Future  4. Pulmonary emphysema, unspecified emphysema type ([place]) Seen on CT however patient denies any pulmonary symptoms, will continue to monitor  5. Low back pain due to bilateral sciatica May use tizanidine as needed, pt aware that he should not drive or operate machinery while taking this medication - tiZANidine (ZANAFLEX) 4 MG tablet; Take 1 tablet (4 mg total) by mouth every 6 (six) hours as needed for muscle spasms.  Dispense: 30 tablet; Refill: 0  6. Nasopharyngeal carcinoma ([place]) Followed by oncology  7. Elevated serum creatinine Will continue to monitor and consider renal ultrasound if not improving  8. Hypocalcemia Will supplement calcium and continue to monitor   General Counseling: Jhonnie Garner understanding of the findings of todays visit and agrees with plan of treatment. I have discussed any further diagnostic evaluation that may be needed or ordered today. We also reviewed his medications today. he has been encouraged to call the office with any questions or concerns that should arise related to todays visit.    Orders Placed This Encounter  Procedures   US Carotid Duplex Bilateral     Meds ordered this encounter  Medications   tiZANidine (ZANAFLEX) 4 MG tablet    Sig: Take 1 tablet (4 mg total) by mouth every 6 (six) hours as needed for muscle spasms.    Dispense:  30 tablet    Refill:  0   rosuvastatin (CRESTOR) 5 MG tablet    Sig: Take 1 tablet (5 mg total) by mouth daily.    Dispense:  90 tablet    Refill:  3     This patient was seen by Drema Dallas, PA-C in collaboration with Dr. Clayborn Bigness as a part of collaborative care agreement.   Total time spent:35 Minutes Time  spent  includes review of chart, medications, test results, and follow up plan with the patient.      Dr Lavera Guise Internal medicine

## 2020-11-28 ENCOUNTER — Telehealth (INDEPENDENT_AMBULATORY_CARE_PROVIDER_SITE_OTHER): Payer: Self-pay

## 2020-11-28 NOTE — Telephone Encounter (Signed)
Patient returned my call and is scheduled with Dr. Lucky Cowboy for a port removal on 11/29/20 with a 11:30 am arrival time to the MM. Pre-procedure instructions were discussed and patient understood.

## 2020-11-28 NOTE — Telephone Encounter (Signed)
I attempted to contact the patient to schedule him for a port removal and a message was left for a return call.

## 2020-11-29 ENCOUNTER — Encounter: Payer: Self-pay | Admitting: Vascular Surgery

## 2020-11-29 ENCOUNTER — Encounter
Admission: RE | Disposition: A | Discharge status: Home / Self Care | Payer: Self-pay | Source: Home / Self Care | Attending: Vascular Surgery

## 2020-11-29 ENCOUNTER — Encounter: Payer: Self-pay | Admitting: Certified Registered"

## 2020-11-29 ENCOUNTER — Other Ambulatory Visit (INDEPENDENT_AMBULATORY_CARE_PROVIDER_SITE_OTHER): Payer: Self-pay | Admitting: Nurse Practitioner

## 2020-11-29 ENCOUNTER — Other Ambulatory Visit: Payer: Self-pay

## 2020-11-29 ENCOUNTER — Ambulatory Visit
Admission: RE | Admit: 2020-11-29 | Discharge: 2020-11-29 | Disposition: A | Discharge status: Home / Self Care | Payer: Medicare HMO | Attending: Vascular Surgery | Admitting: Vascular Surgery

## 2020-11-29 DIAGNOSIS — Z7982 Long term (current) use of aspirin: Secondary | ICD-10-CM | POA: Diagnosis not present

## 2020-11-29 DIAGNOSIS — Z452 Encounter for adjustment and management of vascular access device: Secondary | ICD-10-CM | POA: Insufficient documentation

## 2020-11-29 DIAGNOSIS — C119 Malignant neoplasm of nasopharynx, unspecified: Secondary | ICD-10-CM | POA: Insufficient documentation

## 2020-11-29 DIAGNOSIS — Z885 Allergy status to narcotic agent status: Secondary | ICD-10-CM | POA: Insufficient documentation

## 2020-11-29 DIAGNOSIS — Z79899 Other long term (current) drug therapy: Secondary | ICD-10-CM | POA: Insufficient documentation

## 2020-11-29 DIAGNOSIS — Z923 Personal history of irradiation: Secondary | ICD-10-CM | POA: Insufficient documentation

## 2020-11-29 DIAGNOSIS — Z882 Allergy status to sulfonamides status: Secondary | ICD-10-CM | POA: Insufficient documentation

## 2020-11-29 DIAGNOSIS — Z9221 Personal history of antineoplastic chemotherapy: Secondary | ICD-10-CM | POA: Insufficient documentation

## 2020-11-29 DIAGNOSIS — Z880 Allergy status to penicillin: Secondary | ICD-10-CM | POA: Diagnosis not present

## 2020-11-29 DIAGNOSIS — Z87891 Personal history of nicotine dependence: Secondary | ICD-10-CM | POA: Diagnosis not present

## 2020-11-29 DIAGNOSIS — C760 Malignant neoplasm of head, face and neck: Secondary | ICD-10-CM

## 2020-11-29 HISTORY — PX: PORTA CATH REMOVAL: CATH118286

## 2020-11-29 SURGERY — PORTA CATH REMOVAL
Anesthesia: Moderate Sedation

## 2020-11-29 MED ORDER — CLINDAMYCIN PHOSPHATE 300 MG/50ML IV SOLN
300.0000 mg | Freq: Once | INTRAVENOUS | Status: AC
  Administered 2020-11-29: 300 mg via INTRAVENOUS

## 2020-11-29 MED ORDER — MIDAZOLAM HCL 2 MG/2ML IJ SOLN
INTRAMUSCULAR | Status: AC
  Filled 2020-11-29: qty 2

## 2020-11-29 MED ORDER — ONDANSETRON HCL 4 MG/2ML IJ SOLN: 4.0000 mg | Freq: Four times a day (QID) | INTRAMUSCULAR | Status: DC | PRN

## 2020-11-29 MED ORDER — FENTANYL CITRATE (PF) 100 MCG/2ML IJ SOLN
INTRAMUSCULAR | Status: AC
  Filled 2020-11-29: qty 2

## 2020-11-29 MED ORDER — CLINDAMYCIN PHOSPHATE 300 MG/50ML IV SOLN
INTRAVENOUS | Status: AC
  Filled 2020-11-29: qty 50

## 2020-11-29 MED ORDER — MIDAZOLAM HCL 2 MG/ML PO SYRP: 8.0000 mg | ORAL_SOLUTION | Freq: Once | ORAL | Status: DC | PRN

## 2020-11-29 MED ORDER — FENTANYL CITRATE (PF) 100 MCG/2ML IJ SOLN
INTRAMUSCULAR | Status: DC | PRN
  Administered 2020-11-29: 25 ug via INTRAVENOUS

## 2020-11-29 MED ORDER — METHYLPREDNISOLONE SODIUM SUCC 125 MG IJ SOLR: 125.0000 mg | Freq: Once | INTRAMUSCULAR | Status: DC | PRN

## 2020-11-29 MED ORDER — CHLORHEXIDINE GLUCONATE CLOTH 2 % EX PADS
6.0000 | MEDICATED_PAD | Freq: Every day | CUTANEOUS | Status: DC
  Administered 2020-11-29: 6 via TOPICAL

## 2020-11-29 MED ORDER — DIPHENHYDRAMINE HCL 50 MG/ML IJ SOLN: 50.0000 mg | Freq: Once | INTRAMUSCULAR | Status: DC | PRN

## 2020-11-29 MED ORDER — HYDROMORPHONE HCL 1 MG/ML IJ SOLN: 1.0000 mg | Freq: Once | INTRAMUSCULAR | Status: DC | PRN

## 2020-11-29 MED ORDER — FAMOTIDINE 20 MG PO TABS: 40.0000 mg | ORAL_TABLET | Freq: Once | ORAL | Status: DC | PRN

## 2020-11-29 MED ORDER — MIDAZOLAM HCL 2 MG/2ML IJ SOLN
INTRAMUSCULAR | Status: DC | PRN
  Administered 2020-11-29: 1 mg via INTRAVENOUS

## 2020-11-29 MED ORDER — SODIUM CHLORIDE 0.9 % IV SOLN: INTRAVENOUS | Status: DC

## 2020-11-29 SURGICAL SUPPLY — 11 items
ADH SKN CLS APL DERMABOND .7 (GAUZE/BANDAGES/DRESSINGS) ×1
COVER SURGICAL LIGHT HANDLE (MISCELLANEOUS) ×1 IMPLANT
DERMABOND ADVANCED (GAUZE/BANDAGES/DRESSINGS) ×1
DERMABOND ADVANCED .7 DNX12 (GAUZE/BANDAGES/DRESSINGS) IMPLANT
PACK ANGIOGRAPHY (CUSTOM PROCEDURE TRAY) ×1 IMPLANT
PENCIL ELECTRO HAND CTR (MISCELLANEOUS) ×1 IMPLANT
SPONGE XRAY 4X4 16PLY STRL (MISCELLANEOUS) ×1 IMPLANT
SUT MNCRL AB 4-0 PS2 18 (SUTURE) ×1 IMPLANT
SUT VIC AB 3-0 SH 27 (SUTURE) ×2
SUT VIC AB 3-0 SH 27X BRD (SUTURE) IMPLANT
TOWEL OR 17X26 4PK STRL BLUE (TOWEL DISPOSABLE) ×1 IMPLANT

## 2020-11-29 NOTE — Interval H&P Note (Signed)
History and Physical Interval Note:  11/29/2020 12:05 PM  [name]  has presented today for surgery, with the diagnosis of Porta Cath Removal   Head and neck Ca.  The various methods of treatment have been discussed with the patient and family. After consideration of risks, benefits and other options for treatment, the patient has consented to  Procedure(s): PORTA CATH REMOVAL (N/A) as a surgical intervention.  The patient's history has been reviewed, patient examined, no change in status, stable for surgery.  I have reviewed the patient's chart and labs.  Questions were answered to the patient's satisfaction.     Leotis Pain

## 2020-11-29 NOTE — Op Note (Signed)
[facility]       Operative Note  Date: 11/29/2020  Preoperative diagnosis:  1. Head and neck cancer, completed therapy and no longer using port  Postoperative diagnosis:  Same as above  Procedures: #1. Removal of right jugular port a cath   Surgeon: Leotis Pain, MD  Anesthesia: Local with moderate conscious sedation for 14 minutes using 1 mg of Versed and 25 mcg of Fentanyl  Fluoroscopy time: none  Contrast used: 0  Estimated blood loss: Minimal  Indication for the procedure:  The patient is a 70 y.o. male who has completed his treatment for head and neck cancer and no longer needs their Port-A-Cath. The patient desires to have this removed. Risks and benefits including need for potential replacement with recurrent disease were discussed and patient is agreeable to proceed.  Description of procedure: The patient was brought to the vascular and interventional radiology suite. Moderate conscious sedation was administered during a face to face encounter with the patient throughout the procedure with my supervision of the RN administering medicines and monitoring the patient's vital signs, pulse oximetry, telemetry and mental status throughout from the start of the procedure until the patient was taken to the recovery room.  The right neck chest and shoulder were sterilely prepped and draped, and a sterile surgical field was created. The area was then anesthetized with 1% lidocaine copiously. The previous incision was reopened and electrocautery used to dissected down to the port and the catheter. These were dissected free and the catheter was gently removed from the vein in its entirety. The port was dissected out from the fibrous connective tissue and the Prolene sutures were removed. The port was then removed in its entirety including the catheter. The wound was then closed with a 3-0 Vicryl and a 4-0 Monocryl and Dermabond was  placed as a dressing. The patient was then taken to the recovery room in stable condition having tolerated the procedure well.  Complications: none  Condition: stable   Leotis Pain, MD 11/29/2020 12:57 PM   This note was created with Dragon Medical transcription system. Any errors in dictation are purely unintentional.

## 2020-11-30 ENCOUNTER — Encounter: Payer: Self-pay | Admitting: Vascular Surgery

## 2021-01-05 ENCOUNTER — Telehealth: Payer: Self-pay

## 2021-01-05 NOTE — Telephone Encounter (Signed)
Left vm to confirm 01/10/21 ultrasound appointment-Toni

## 2021-01-10 ENCOUNTER — Ambulatory Visit (INDEPENDENT_AMBULATORY_CARE_PROVIDER_SITE_OTHER): Payer: Medicare HMO

## 2021-01-10 ENCOUNTER — Other Ambulatory Visit: Payer: Self-pay

## 2021-01-10 DIAGNOSIS — R0989 Other specified symptoms and signs involving the circulatory and respiratory systems: Secondary | ICD-10-CM | POA: Diagnosis not present

## 2021-01-10 DIAGNOSIS — M5441 Lumbago with sciatica, right side: Secondary | ICD-10-CM

## 2021-01-10 MED ORDER — TIZANIDINE HCL 4 MG PO TABS: 4.0000 mg | ORAL_TABLET | Freq: Four times a day (QID) | ORAL | 1 refills | Status: DC | PRN

## 2021-01-22 ENCOUNTER — Ambulatory Visit: Payer: Medicare HMO | Admitting: Physician Assistant

## 2021-01-27 NOTE — Procedures (Signed)
[place], [place] [address]  DATE OF SERVICE: January 10, 2021  CAROTID DOPPLER INTERPRETATION:  Bilateral Carotid Ultrsasound and Color Doppler Examination was performed. The RIGHT CCA shows no significant plaque in the vessel. The LEFT CCA shows no significant plaque in the vessel. There was no significant intimal thickening noted in the RIGHT carotid artery. There was no significant intimal thickening in the LEFT carotid artery.  The RIGHT CCA shows peak systolic velocity of 96 cm per second. The end diastolic velocity is 35 cm per second on the RIGHT side. The RIGHT ICA shows peak systolic velocity of [id number] per second. RIGHT sided ICA end diastolic velocity is 49 cm per second. The RIGHT ECA shows a peak systolic velocity of 123456 cm per second. The ICA/CCA ratio is calculated to be 1.76. This suggests 50 to 69% stenosis. The Vertebral Artery shows antegrade flow.  The LEFT CCA shows peak systolic velocity of [id number] cm per second. The end diastolic velocity is 40 cm per second on the LEFT side. The LEFT ICA shows peak systolic velocity of 123456 per second. LEFT sided ICA end diastolic velocity is 29 cm per second. The LEFT ECA shows a peak systolic velocity of [id number] cm per second. The ICA/CCA ratio is calculated to be 0.85. This suggests less than 50% stenosis. The Vertebral Artery shows antegrade flow.   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 The LEFT CAROTID shows less than 50% stenosis.  There is no significant plaque formation noted on the LEFT and no significant on the RIGHT  side. Consider a repeat Carotid doppler if clinical situation and symptoms warrant in 6-12 months. Patient should be encouraged to change lifestyles such as smoking cessation, regular exercise and dietary modification. Use of statins in the right clinical setting and ASA is encouraged.  Allyne Gee, MD [facility] Pulmonary Critical Care Medicine

## 2021-02-01 IMAGING — CR DG CHEST 2V
1 series · 2 of 2 positions shown · non-contrast
Comparison: 11/03/2019, PET CT 09/21/2019

CLINICAL DATA: Altered mental status

EXAM:
CHEST - 2 VIEW

[Series 1: dg chest 2 view · 0.14mm/px · 2 of 2 slices shown]
[im 1/2]
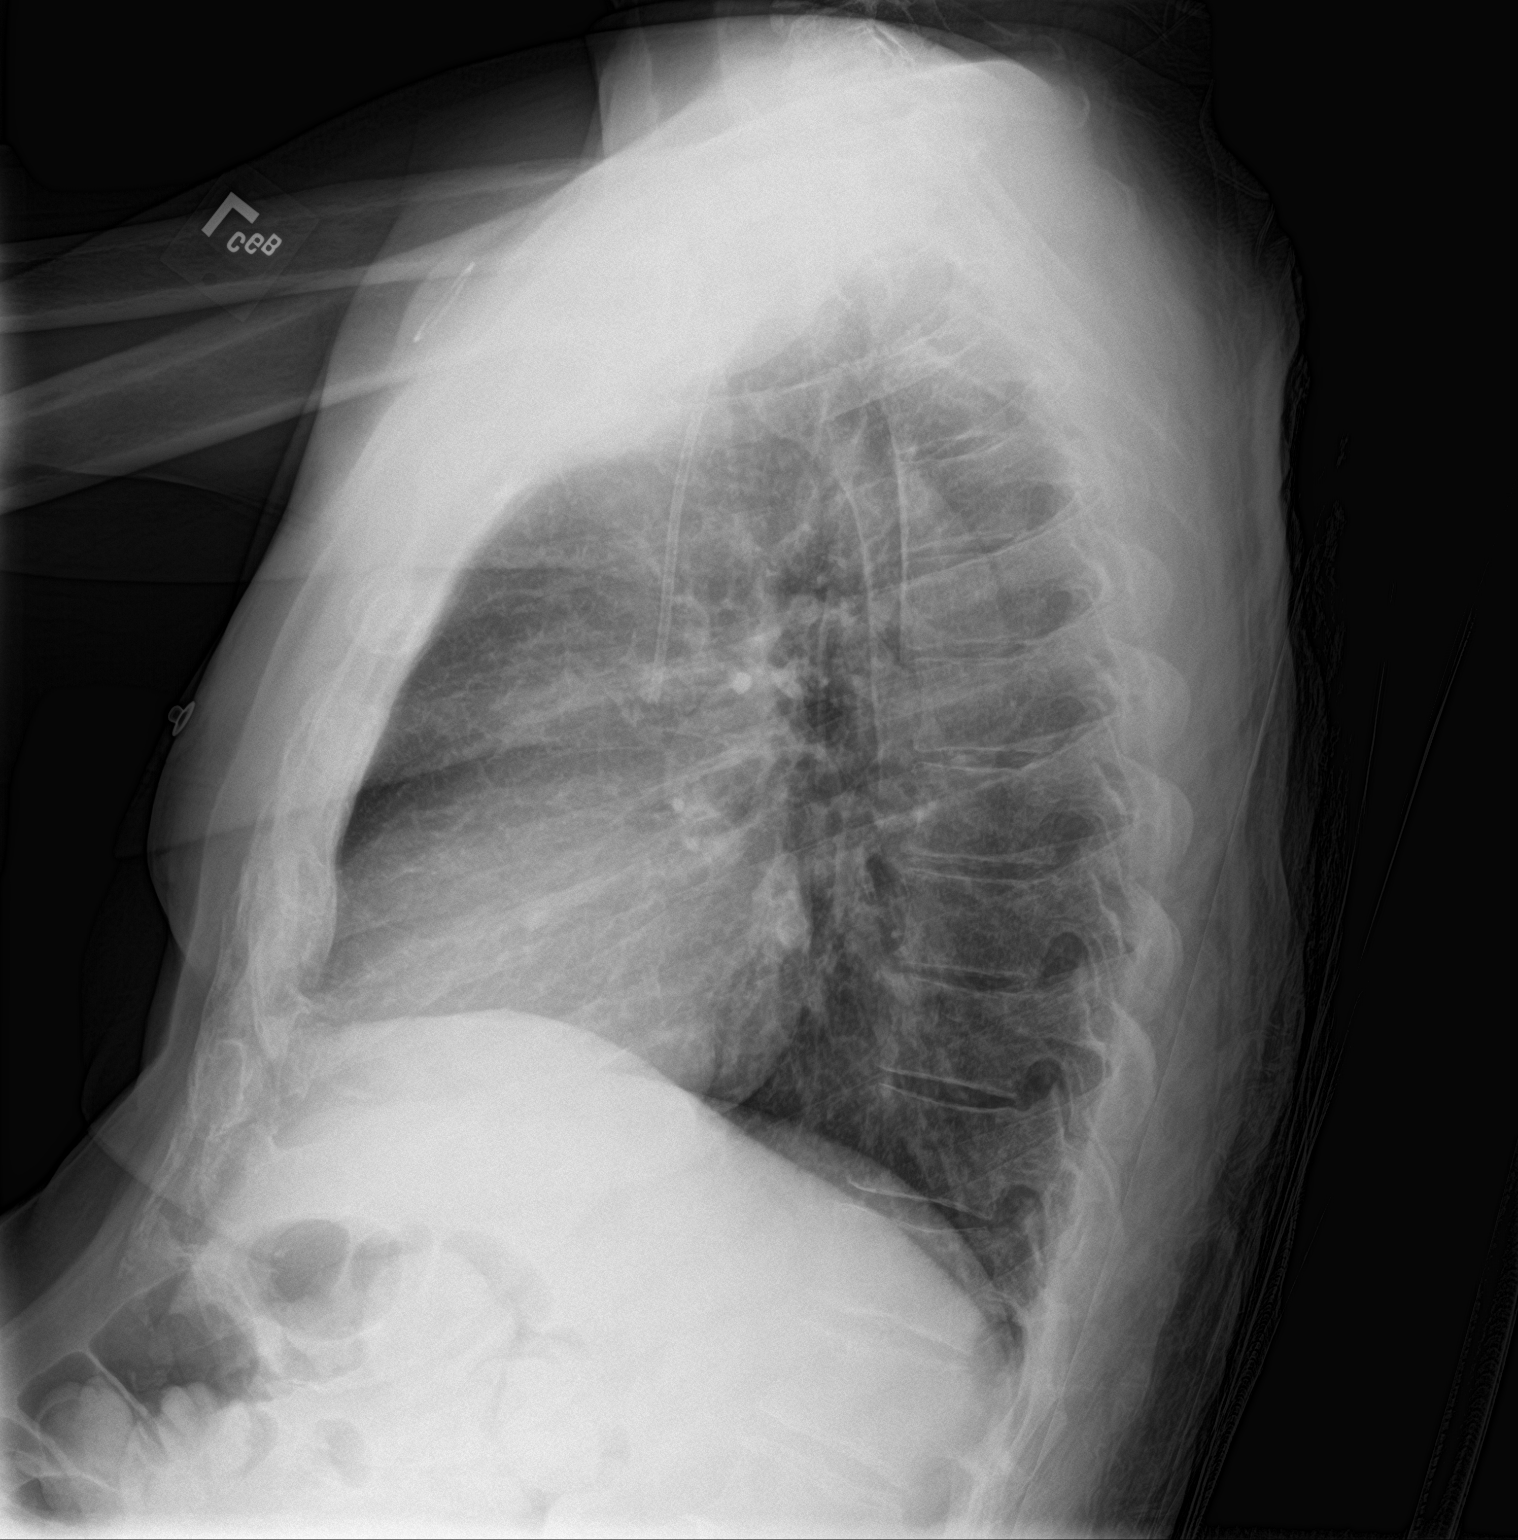
[im 2/2]
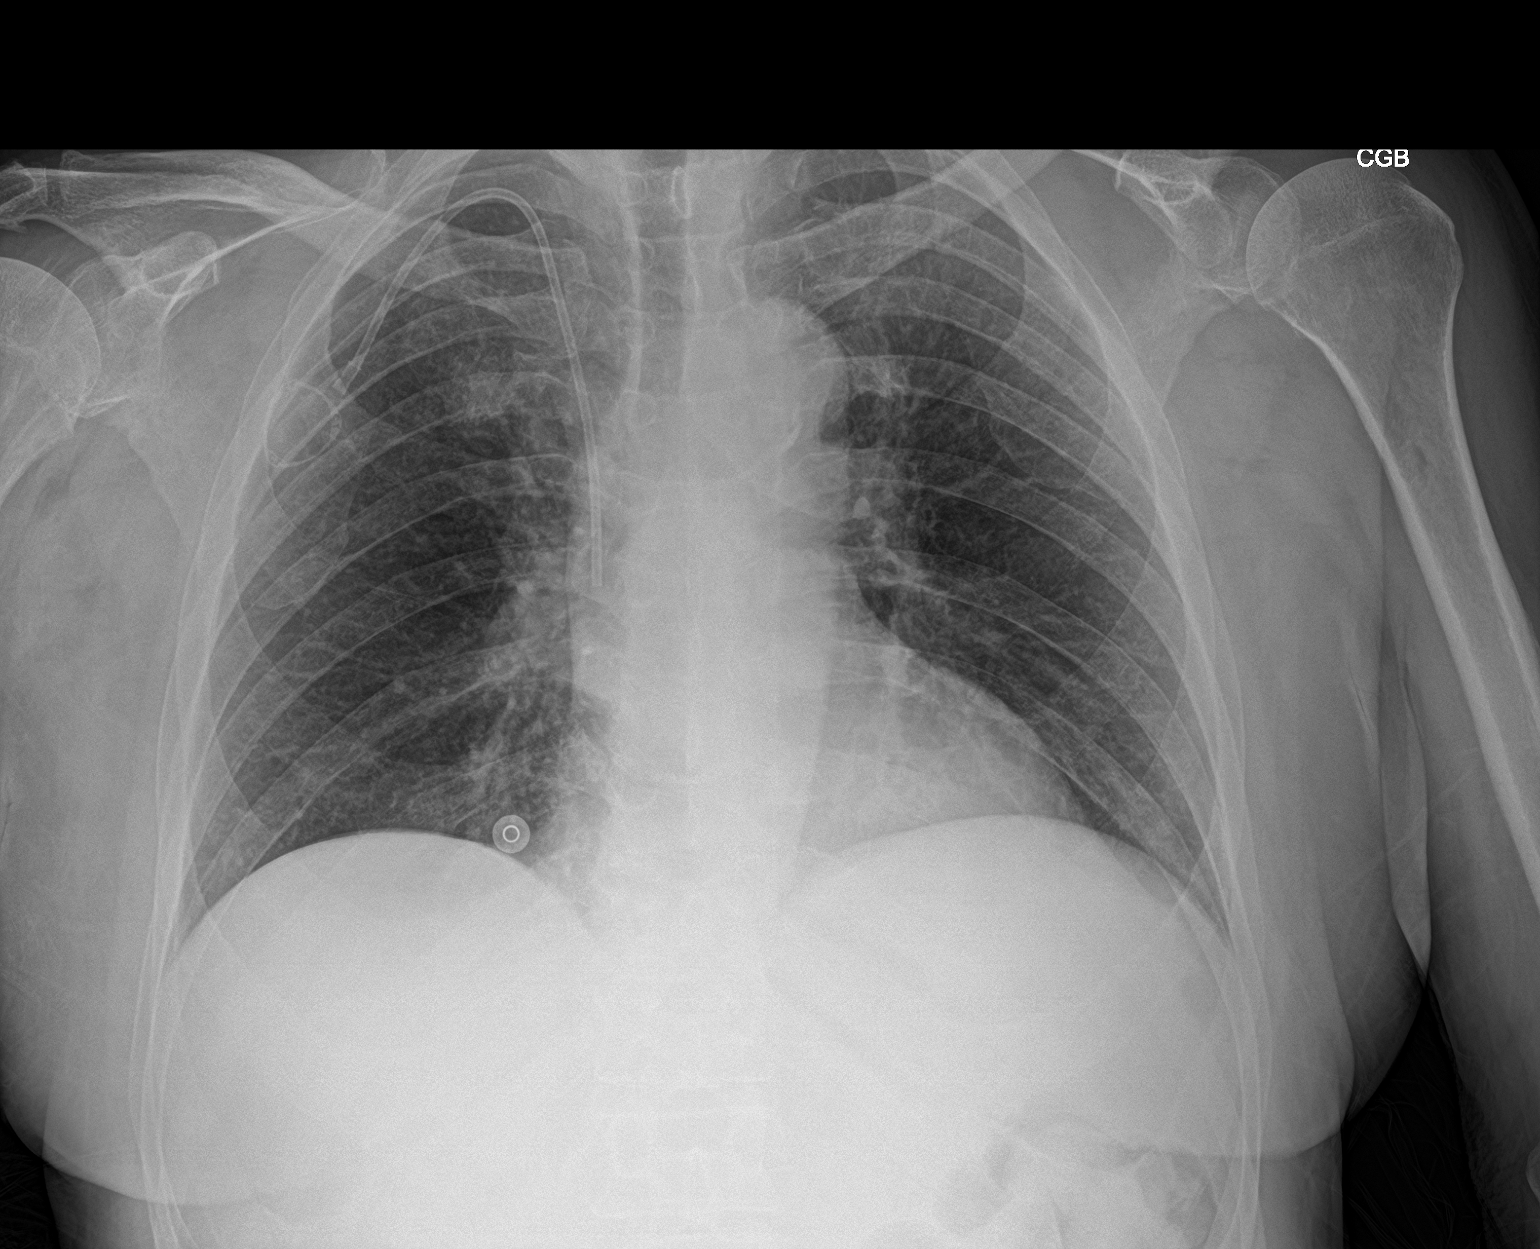

[2 of 2 positions shown; findings below may reference images not displayed]

FINDINGS: Right-sided central venous port tip over the SVC. No acute
consolidation or effusion. Stable cardiomediastinal silhouette with
aortic atherosclerosis. No pneumothorax.
IMPRESSION: No active cardiopulmonary disease.

## 2021-02-07 ENCOUNTER — Ambulatory Visit (INDEPENDENT_AMBULATORY_CARE_PROVIDER_SITE_OTHER): Payer: Medicare HMO | Admitting: Dermatology

## 2021-02-07 ENCOUNTER — Other Ambulatory Visit: Payer: Self-pay

## 2021-02-07 DIAGNOSIS — D692 Other nonthrombocytopenic purpura: Secondary | ICD-10-CM | POA: Diagnosis not present

## 2021-02-07 DIAGNOSIS — L820 Inflamed seborrheic keratosis: Secondary | ICD-10-CM

## 2021-02-07 DIAGNOSIS — L578 Other skin changes due to chronic exposure to nonionizing radiation: Secondary | ICD-10-CM | POA: Diagnosis not present

## 2021-02-07 DIAGNOSIS — L821 Other seborrheic keratosis: Secondary | ICD-10-CM | POA: Diagnosis not present

## 2021-02-07 NOTE — Progress Notes (Signed)
   New Patient Visit  Subjective  [name] is a 70 y.o. male who presents for the following: Nevus (Arms, face).  New patient referral from Dr. Clayborn Bigness.  The following portions of the chart were reviewed this encounter and updated as appropriate:   Tobacco  Allergies  Meds  Problems  Med Hx  Surg Hx  Fam Hx     Review of Systems:  No other skin or systemic complaints except as noted in HPI or Assessment and Plan.  Objective  Well appearing patient in no apparent distress; mood and affect are within normal limits.  A focused examination was performed including face, arms, back. Relevant physical exam findings are noted in the Assessment and Plan. Pt declines skin cancer screening today.  R forearm x 1, R temple x 1, Total = 2 Erythematous keratotic or waxy stuck-on papule or plaque.    Assessment & Plan   Actinic Damage - chronic, secondary to cumulative UV radiation exposure/sun exposure over time - diffuse scaly erythematous macules with underlying dyspigmentation - Recommend daily broad spectrum sunscreen SPF 30+ to sun-exposed areas, reapply every 2 hours as needed.  - Recommend staying in the shade or wearing long sleeves, sun glasses (UVA+UVB protection) and wide brim hats (4-inch brim around the entire circumference of the hat). - Call for new or changing lesions.  Seborrheic Keratoses - Stuck-on, waxy, tan-brown papules and/or plaques  - Benign-appearing - Discussed benign etiology and prognosis. - Observe - Call for any changes  Purpura - Chronic; persistent and recurrent.  Treatable, but not curable. - Violaceous macules and patches - Benign - Related to trauma, age, sun damage and/or use of blood thinners, chronic use of topical and/or oral steroids - Observe - Can use OTC arnica containing moisturizer such as Dermend Bruise Formula if desired - Call for worsening or other concerns  Inflamed seborrheic keratosis R forearm x 1, R temple x 1,  Total = 2  Destruction of lesion - R forearm x 1, R temple x 1, Total = 2 Complexity: simple   Destruction method: cryotherapy   Informed consent: discussed and consent obtained   Timeout:  patient name, date of birth, surgical site, and procedure verified Lesion destroyed using liquid nitrogen: Yes   Region frozen until ice ball extended beyond lesion: Yes   Outcome: patient tolerated procedure well with no complications   Post-procedure details: wound care instructions given    Return if symptoms worsen or fail to improve.  I, Othelia Pulling, RMA, am acting as scribe for Sarina Ser, MD . Documentation: I have reviewed the above documentation for accuracy and completeness, and I agree with the above.  Sarina Ser, MD

## 2021-02-07 NOTE — Patient Instructions (Addendum)

## 2021-02-09 ENCOUNTER — Encounter: Payer: Self-pay | Admitting: Dermatology

## 2021-02-22 ENCOUNTER — Ambulatory Visit (INDEPENDENT_AMBULATORY_CARE_PROVIDER_SITE_OTHER): Payer: Medicare HMO | Admitting: Physician Assistant

## 2021-02-22 ENCOUNTER — Encounter: Payer: Self-pay | Admitting: Physician Assistant

## 2021-02-22 ENCOUNTER — Other Ambulatory Visit: Payer: Self-pay

## 2021-02-22 VITALS — BP 140/72 | HR 84 | Temp 98.6°F | Resp 16 | Ht 69.0 in | Wt 201.6 lb

## 2021-02-22 DIAGNOSIS — M5441 Lumbago with sciatica, right side: Secondary | ICD-10-CM

## 2021-02-22 DIAGNOSIS — I6521 Occlusion and stenosis of right carotid artery: Secondary | ICD-10-CM

## 2021-02-22 DIAGNOSIS — G629 Polyneuropathy, unspecified: Secondary | ICD-10-CM

## 2021-02-22 DIAGNOSIS — I700 Atherosclerosis of aorta: Secondary | ICD-10-CM | POA: Diagnosis not present

## 2021-02-22 DIAGNOSIS — Z23 Encounter for immunization: Secondary | ICD-10-CM | POA: Diagnosis not present

## 2021-02-22 DIAGNOSIS — M5442 Lumbago with sciatica, left side: Secondary | ICD-10-CM

## 2021-02-22 DIAGNOSIS — Z125 Encounter for screening for malignant neoplasm of prostate: Secondary | ICD-10-CM | POA: Diagnosis not present

## 2021-02-22 DIAGNOSIS — I10 Essential (primary) hypertension: Secondary | ICD-10-CM | POA: Diagnosis not present

## 2021-02-22 DIAGNOSIS — R5383 Other fatigue: Secondary | ICD-10-CM

## 2021-02-22 MED ORDER — PREGABALIN 25 MG PO CAPS: 25.0000 mg | ORAL_CAPSULE | Freq: Two times a day (BID) | ORAL | 1 refills | Status: DC

## 2021-02-22 MED ORDER — TIZANIDINE HCL 4 MG PO TABS: 4.0000 mg | ORAL_TABLET | Freq: Four times a day (QID) | ORAL | 1 refills | Status: DC | PRN

## 2021-02-22 MED ORDER — TETANUS-DIPHTH-ACELL PERTUSSIS 5-2.5-18.5 LF-MCG/0.5 IM SUSP: 0.5000 mL | Freq: Once | INTRAMUSCULAR | 0 refills | Status: AC

## 2021-02-22 NOTE — Progress Notes (Signed)
[facility] [place], [place] [address]  Internal MEDICINE  Office Visit Note  Patient Name: [name]  [id number]  [id number]  Date of Service: 02/26/2021  Chief Complaint  Patient presents with   Results    Carotid and lab results    HPI Pt is here for routine follow up -He is requesting refill of tizanidine -Had his visit with derm and no skin cancer found. -Sees oncology in Dec -reviewed carotid US showing 50-69% stenosis on right carotid and <50% on left without significant plaque. Pt continues to take 81mg  ASA and crestor and will consider repeating in 6-71months -Bp at home 440-102V systolic -Significant neuropathic pain in feet that has been worse lately. He has tried gabapentin in the past and this did not help but is interested in alternative with lyrica. Discussed starting low dose and titrating if needed -Did not get labs ordered last spring done, but states he will go now--new orders placed  Current Medication: Outpatient Encounter Medications as of 02/22/2021  Medication Sig   pregabalin (LYRICA) 25 MG capsule Take 1 capsule (25 mg total) by mouth 2 (two) times daily.   rosuvastatin (CRESTOR) 5 MG tablet Take 1 tablet (5 mg total) by mouth daily.   [EXPIRED] Tdap (BOOSTRIX) 5-2.5-18.5 LF-MCG/0.5 injection Inject 0.5 mLs into the muscle once for 1 dose.   amLODipine (NORVASC) 5 MG tablet Take 1 tablet (5 mg total) by mouth daily.   antiseptic oral rinse (BIOTENE) LIQD 15 mLs by Mouth Rinse route as needed for dry mouth.   aspirin EC 81 MG tablet Take 81 mg by mouth daily. Swallow whole.   Multiple Vitamin (MULTIVITAMIN WITH MINERALS) TABS tablet Take 1 tablet by mouth daily.   tiZANidine (ZANAFLEX) 4 MG tablet Take 1 tablet (4 mg total) by mouth every 6 (six) hours as needed for muscle spasms.   [DISCONTINUED] tiZANidine (ZANAFLEX) 4 MG tablet Take 1 tablet (4 mg total) by mouth every 6 (six) hours as needed for muscle spasms.    Facility-Administered Encounter Medications as of 02/22/2021  Medication   heparin lock flush 100 unit/mL    Surgical History: Past Surgical History:  Procedure Laterality Date   COLONOSCOPY WITH PROPOFOL N/A 12/16/2016   Procedure: COLONOSCOPY WITH PROPOFOL;  Surgeon: Lollie Sails, MD;  Location: [facility] ENDOSCOPY;  Service: Endoscopy;  Laterality: N/A;   COLONOSCOPY WITH PROPOFOL N/A 03/14/2017   Procedure: COLONOSCOPY WITH PROPOFOL;  Surgeon: Lollie Sails, MD;  Location: [facility] ENDOSCOPY;  Service: Endoscopy;  Laterality: N/A;   COLONOSCOPY WITH PROPOFOL     FOOT SURGERY Right 03/12/2002   FOOT SURGERY     HEMORRHOID SURGERY     LITHOTRIPSY     MYRINGOTOMY     MYRINGOTOMY WITH TUBE PLACEMENT Left 04/28/2019   Procedure: MYRINGOTOMY WITH TUBE PLACEMENT;  Surgeon: Carloyn Manner, MD;  Location: [place];  Service: ENT;  Laterality: Left;   NASOPHARYNGOSCOPY N/A 04/28/2019   Procedure: NASOPHARYNGOSCOPY WITH BIOPSY;  Surgeon: Carloyn Manner, MD;  Location: [place];  Service: ENT;  Laterality: N/A;   NASOPHARYNGOSCOPY     Port a cath Placement     PORTA CATH INSERTION N/A 05/20/2019   Procedure: PORTA CATH INSERTION;  Surgeon: Algernon Huxley, MD;  Location: [place] CV LAB;  Service: Cardiovascular;  Laterality: N/A;   PORTA CATH REMOVAL N/A 11/29/2020   Procedure: PORTA CATH REMOVAL;  Surgeon: Algernon Huxley, MD;  Location: [place] CV LAB;  Service: Cardiovascular;  Laterality: N/A;   TONSILLECTOMY      Medical History: Past Medical History:  Diagnosis Date   Alcohol abuse    Benzodiazepine dependence (HCC)    Benzodiazepine withdrawal (HCC)    Chronic pain in right foot    COPD (chronic obstructive pulmonary disease) ([place])    Depression 08/19/2017   Depression    Hepatitis C    Hypertension    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HCC)    Seizures ([place]) 2010   Seizures (HCC)    Sleep apnea    Syncope    questionable vasovagal   Syncope      Family History: Family History  Problem Relation Age of Onset   Arthritis Mother    Hypertension Mother    Cancer Father    Kidney disease Father    Alcohol abuse Paternal Aunt    Depression Maternal Grandfather     Social History   Socioeconomic History   Marital status: Divorced    Spouse name: Not on file   Number of children: Not on file   Years of education: Not on file   Highest education level: Not on file  Occupational History   Not on file  Tobacco Use   Smoking status: Former    Packs/day: 0.50    Years: 50.00    Pack years: 25.00    Types: Cigarettes    Quit date: 02/05/2019    Years since quitting: 2.0   Smokeless tobacco: Former  Scientific laboratory technician Use: Never used  Substance and Sexual Activity   Alcohol use: Not Currently   Drug use: Not Currently   Sexual activity: Not Currently  Other Topics Concern   Not on file  Social History Narrative   ** Merged History Encounter **       Social Determinants of Health   Financial Resource Strain: Not on file  Food Insecurity: Not on file  Transportation Needs: Not on file  Physical Activity: Not on file  Stress: Not on file  Social Connections: Not on file  Intimate Partner Violence: Not on file      Review of Systems  Constitutional:  Negative for chills, fatigue and unexpected weight change.  HENT:  Negative for congestion, postnasal drip, rhinorrhea, sneezing and sore throat.   Eyes:  Negative for redness.  Respiratory:  Negative for cough, chest tightness and shortness of breath.   Cardiovascular:  Negative for chest pain and palpitations.  Gastrointestinal:  Negative for abdominal pain, constipation, diarrhea, nausea and vomiting.  Genitourinary:  Negative for dysuria and frequency.  Musculoskeletal:  Positive for arthralgias and back pain. Negative for joint swelling and neck pain.  Skin:  Negative for rash.  Neurological: Negative.  Negative for tremors and numbness.       Neuralgia  in both feet  Hematological:  Negative for adenopathy. Does not bruise/bleed easily.  Psychiatric/Behavioral:  Negative for behavioral problems (Depression), sleep disturbance and suicidal ideas. The patient is not nervous/anxious.    Vital Signs: BP 140/72 Comment: 142/78  Pulse 84   Temp 98.6 F (37 C)   Resp 16   Ht 5\' 9"  (1.753 m)   Wt 201 lb 9.6 oz (91.4 kg)   SpO2 94%   BMI 29.77 kg/m    Physical Exam Vitals and nursing note reviewed.  Constitutional:      General: He is not in acute distress.    Appearance: He is well-developed. He is not diaphoretic.  HENT:     Head: Normocephalic and  atraumatic.     Mouth/Throat:     Pharynx: No oropharyngeal exudate.  Eyes:     Pupils: Pupils are equal, round, and reactive to light.  Neck:     Thyroid: No thyromegaly.     Vascular: No JVD.     Trachea: No tracheal deviation.  Cardiovascular:     Rate and Rhythm: Normal rate and regular rhythm.     Heart sounds: Normal heart sounds. No murmur heard.   No friction rub. No gallop.  Pulmonary:     Effort: Pulmonary effort is normal. No respiratory distress.     Breath sounds: No wheezing or rales.  Chest:     Chest wall: No tenderness.  Abdominal:     General: Bowel sounds are normal.     Palpations: Abdomen is soft.  Musculoskeletal:        General: Normal range of motion.     Cervical back: Normal range of motion and neck supple.  Lymphadenopathy:     Cervical: No cervical adenopathy.  Skin:    General: Skin is warm and dry.  Neurological:     Mental Status: He is alert and oriented to person, place, and time.     Cranial Nerves: No cranial nerve deficit.  Psychiatric:        Behavior: Behavior normal.        Thought Content: Thought content normal.        Judgment: Judgment normal.       Assessment/Plan: 1. Essential hypertension Stable, continue amlodipine  2. Aortic atherosclerosis (HCC) Continue Crestor  3. Stenosis of right carotid artery Continue  Crestor, will update labs and consider repeat imaging in 6 to 12 months - Lipid Panel With LDL/HDL Ratio  4. Neuropathy Previously failed on gabapentin, will try low-dose Lyrica and titrate up as needed - pregabalin (LYRICA) 25 MG capsule; Take 1 capsule (25 mg total) by mouth 2 (two) times daily.  Dispense: 60 capsule; Refill: 1  5. 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  Dispense: 30 tablet; Refill: 1  6. Need for diphtheria-tetanus-pertussis (Tdap) vaccine - Tdap (BOOSTRIX) 5-2.5-18.5 LF-MCG/0.5 injection; Inject 0.5 mLs into the muscle once for 1 dose.  Dispense: 0.5 mL; Refill: 0  7. Special screening for malignant neoplasm of prostate - PSA Total (Reflex To Free)  8. Other fatigue - TSH + free T4 - Lipid Panel With LDL/HDL Ratio - Comprehensive metabolic panel - CBC w/Diff/Platelet   General Counseling: [name] verbalizes understanding of the findings of todays visit and agrees with plan of treatment. I have discussed any further diagnostic evaluation that may be needed or ordered today. We also reviewed his medications today. he has been encouraged to call the office with any questions or concerns that should arise related to todays visit.    Orders Placed This Encounter  Procedures   PSA Total (Reflex To Free)   TSH + free T4   Lipid Panel With LDL/HDL Ratio   Comprehensive metabolic panel   CBC w/Diff/Platelet    Meds ordered this encounter  Medications   Tdap (BOOSTRIX) 5-2.5-18.5 LF-MCG/0.5 injection    Sig: Inject 0.5 mLs into the muscle once for 1 dose.    Dispense:  0.5 mL    Refill:  0   tiZANidine (ZANAFLEX) 4 MG tablet    Sig: Take 1 tablet (4 mg total) by mouth  every 6 (six) hours as needed for muscle spasms.    Dispense:  30 tablet    Refill:  1   pregabalin (LYRICA) 25 MG capsule    Sig: Take 1 capsule (25 mg total) by mouth 2  (two) times daily.    Dispense:  60 capsule    Refill:  1    This patient was seen by Drema Dallas, PA-C in collaboration with Dr. Clayborn Bigness as a part of collaborative care agreement.   Total time spent:30 Minutes Time spent includes review of chart, medications, test results, and follow up plan with the patient.      Dr Lavera Guise Internal medicine

## 2021-02-25 ENCOUNTER — Telehealth: Payer: Self-pay

## 2021-02-25 NOTE — Telephone Encounter (Signed)
PA sent for PREGABALIN 25 mg 02/25/21 @ 10:18pm  Response at 10:30pm PA approved valid from 05/13/20 to 05/12/21

## 2021-04-09 DIAGNOSIS — Z8522 Personal history of malignant neoplasm of nasal cavities, middle ear, and accessory sinuses: Secondary | ICD-10-CM | POA: Diagnosis not present

## 2021-04-09 DIAGNOSIS — R682 Dry mouth, unspecified: Secondary | ICD-10-CM | POA: Diagnosis not present

## 2021-04-09 DIAGNOSIS — M26652 Arthropathy of left temporomandibular joint: Secondary | ICD-10-CM | POA: Diagnosis not present

## 2021-04-09 DIAGNOSIS — B379 Candidiasis, unspecified: Secondary | ICD-10-CM | POA: Diagnosis not present

## 2021-04-09 DIAGNOSIS — H6980 Other specified disorders of Eustachian tube, unspecified ear: Secondary | ICD-10-CM | POA: Diagnosis not present

## 2021-04-26 ENCOUNTER — Encounter: Payer: Self-pay | Admitting: Physician Assistant

## 2021-04-26 ENCOUNTER — Ambulatory Visit (INDEPENDENT_AMBULATORY_CARE_PROVIDER_SITE_OTHER): Payer: Medicare HMO | Admitting: Physician Assistant

## 2021-04-26 ENCOUNTER — Other Ambulatory Visit: Payer: Self-pay

## 2021-04-26 ENCOUNTER — Telehealth: Payer: Self-pay

## 2021-04-26 DIAGNOSIS — I10 Essential (primary) hypertension: Secondary | ICD-10-CM

## 2021-04-26 DIAGNOSIS — M5442 Lumbago with sciatica, left side: Secondary | ICD-10-CM

## 2021-04-26 DIAGNOSIS — G629 Polyneuropathy, unspecified: Secondary | ICD-10-CM | POA: Diagnosis not present

## 2021-04-26 DIAGNOSIS — M5441 Lumbago with sciatica, right side: Secondary | ICD-10-CM | POA: Diagnosis not present

## 2021-04-26 MED ORDER — TIZANIDINE HCL 4 MG PO TABS: 4.0000 mg | ORAL_TABLET | Freq: Four times a day (QID) | ORAL | 1 refills | Status: DC | PRN

## 2021-04-26 MED ORDER — TRAMADOL HCL 50 MG PO TABS: 50.0000 mg | ORAL_TABLET | Freq: Two times a day (BID) | ORAL | 0 refills | Status: AC | PRN

## 2021-04-26 MED ORDER — DULOXETINE HCL 20 MG PO CPEP: 20.0000 mg | ORAL_CAPSULE | Freq: Every day | ORAL | 3 refills | Status: DC

## 2021-04-26 NOTE — Telephone Encounter (Signed)
Awaiting 04/26/21 office notes for pain mgmt referral-Toni

## 2021-04-26 NOTE — Progress Notes (Signed)
[facility] [place], [place] [address]  Internal MEDICINE  Office Visit Note  Patient Name: [name]  [id number]  [id number]  Date of Service: 04/29/2021  Chief Complaint  Patient presents with   Follow-up   Hypertension   Depression    HPI Pt is here for routine follow up -neuropathy is worse. Lyrica is not helping, Had increased lyrica to 50mg  twice per day and still not helping at all. Gabapentin previously and did not help. Pain is preventing him from sleeping and makes daily tasks difficult. 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 Discussed trialing cymbalta and then having tramadol to use as necessary in the meantime -Mom broke her hip and was at the hospital for awhile and then in memory care unit and fell again. Hasnt gotten labs done due to this. -taking b12 now as well   Current Medication: Outpatient Encounter Medications as of 04/26/2021  Medication Sig   amLODipine (NORVASC) 5 MG tablet Take 1 tablet (5 mg total) by mouth daily.   antiseptic oral rinse (BIOTENE) LIQD 15 mLs by Mouth Rinse route as needed for dry mouth.   aspirin EC 81 MG tablet Take 81 mg by mouth daily. Swallow whole.   DULoxetine (CYMBALTA) 20 MG capsule Take 1 capsule (20 mg total) by mouth daily.   Multiple Vitamin (MULTIVITAMIN WITH MINERALS) TABS tablet Take 1 tablet by mouth daily.   rosuvastatin (CRESTOR) 5 MG tablet Take 1 tablet (5 mg total) by mouth daily.   traMADol (ULTRAM) 50 MG tablet Take 1 tablet (50 mg total) by mouth every 12 (twelve) hours as needed for up to 5 days.   [DISCONTINUED] pregabalin (LYRICA) 25 MG capsule Take 1 capsule (25 mg total) by mouth 2 (two) times daily.   [DISCONTINUED] tiZANidine (ZANAFLEX) 4 MG tablet Take 1 tablet (4 mg total) by mouth every 6 (six) hours as needed for muscle spasms.   tiZANidine (ZANAFLEX) 4 MG tablet Take 1 tablet (4 mg total) by  mouth every 6 (six) hours as needed for muscle spasms.   Facility-Administered Encounter Medications as of 04/26/2021  Medication   heparin lock flush 100 unit/mL    Surgical History: Past Surgical History:  Procedure Laterality Date   COLONOSCOPY WITH PROPOFOL N/A 12/16/2016   Procedure: COLONOSCOPY WITH PROPOFOL;  Surgeon: Lollie Sails, MD;  [facility] ENDOSCOPY;  Service: Endoscopy;  Laterality: N/A;   COLONOSCOPY WITH PROPOFOL N/A 03/14/2017   Procedure: COLONOSCOPY WITH PROPOFOL;  Surgeon: Lollie Sails, MD;  Location: [facility] ENDOSCOPY;  Service: Endoscopy;  Laterality: N/A;   COLONOSCOPY WITH PROPOFOL     FOOT SURGERY Right 03/12/2002   FOOT SURGERY     HEMORRHOID SURGERY     LITHOTRIPSY     MYRINGOTOMY     MYRINGOTOMY WITH TUBE PLACEMENT Left 04/28/2019   Procedure: MYRINGOTOMY WITH TUBE PLACEMENT;  Surgeon: Carloyn Manner, MD;  Location: [place];  Service: ENT;  Laterality: Left;   NASOPHARYNGOSCOPY N/A 04/28/2019   Procedure: NASOPHARYNGOSCOPY WITH BIOPSY;  Surgeon: Carloyn Manner, MD;  Location: [place];  Service: ENT;  Laterality: N/A;   NASOPHARYNGOSCOPY     Port a cath Placement     PORTA CATH INSERTION N/A 05/20/2019   Procedure: PORTA CATH INSERTION;  Surgeon: Algernon Huxley, MD;  Location: [place] CV LAB;  Service: Cardiovascular;  Laterality: N/A;   PORTA CATH REMOVAL N/A 11/29/2020   Procedure:  PORTA CATH REMOVAL;  Surgeon: Algernon Huxley, MD;  Location: [place] CV LAB;  Service: Cardiovascular;  Laterality: N/A;   TONSILLECTOMY      Medical History: Past Medical History:  Diagnosis Date   Alcohol abuse    Benzodiazepine dependence (HCC)    Benzodiazepine withdrawal (HCC)    Chronic pain in right foot    COPD (chronic obstructive pulmonary disease) ([place])    Depression 08/19/2017   Depression    Hepatitis C    Hypertension    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HCC)    Seizures ([place]) 2010   Seizures (HCC)    Sleep apnea     Syncope    questionable vasovagal   Syncope     Family History: Family History  Problem Relation Age of Onset   Arthritis Mother    Hypertension Mother    Cancer Father    Kidney disease Father    Alcohol abuse Paternal Aunt    Depression Maternal Grandfather     Social History   Socioeconomic History   Marital status: Divorced    Spouse name: Not on file   Number of children: Not on file   Years of education: Not on file   Highest education level: Not on file  Occupational History   Not on file  Tobacco Use   Smoking status: Former    Packs/day: 0.50    Years: 50.00    Pack years: 25.00    Types: Cigarettes    Quit date: 02/05/2019    Years since quitting: 2.2   Smokeless tobacco: Former  Scientific laboratory technician Use: Never used  Substance and Sexual Activity   Alcohol use: Not Currently   Drug use: Not Currently   Sexual activity: Not Currently  Other Topics Concern   Not on file  Social History Narrative   ** Merged History Encounter **       Social Determinants of Health   Financial Resource Strain: Not on file  Food Insecurity: Not on file  Transportation Needs: Not on file  Physical Activity: Not on file  Stress: Not on file  Social Connections: Not on file  Intimate Partner Violence: Not on file      Review of Systems  Constitutional:  Negative for chills, fatigue and unexpected weight change.  HENT:  Negative for congestion, postnasal drip, rhinorrhea, sneezing and sore throat.   Eyes:  Negative for redness.  Respiratory:  Negative for cough, chest tightness and shortness of breath.   Cardiovascular:  Negative for chest pain and palpitations.  Gastrointestinal:  Negative for abdominal pain, constipation, diarrhea, nausea and vomiting.  Genitourinary:  Negative for dysuria and frequency.  Musculoskeletal:  Positive for arthralgias and back pain. Negative for joint swelling and neck pain.  Skin:  Negative for rash.  Neurological: Negative.   Negative for tremors and numbness.       Neuralgia in both feet  Hematological:  Negative for adenopathy. Does not bruise/bleed easily.  Psychiatric/Behavioral:  Negative for behavioral problems (Depression), sleep disturbance and suicidal ideas. The patient is not nervous/anxious.    Vital Signs: BP 132/72    Pulse 67    Temp 97.8 F (36.6 C)    Resp 16    Ht 5\' 9"  (1.753 m)    Wt 210 lb (95.3 kg)    SpO2 94%    BMI 31.01 kg/m    Physical Exam Vitals and nursing note reviewed.  Constitutional:      General: He is not  in acute distress.    Appearance: He is well-developed. He is not diaphoretic.  HENT:     Head: Normocephalic and atraumatic.     Mouth/Throat:     Pharynx: No oropharyngeal exudate.  Eyes:     Pupils: Pupils are equal, round, and reactive to light.  Neck:     Thyroid: No thyromegaly.     Vascular: No JVD.     Trachea: No tracheal deviation.  Cardiovascular:     Rate and Rhythm: Normal rate and regular rhythm.     Heart sounds: Normal heart sounds. No murmur heard.   No friction rub. No gallop.  Pulmonary:     Effort: Pulmonary effort is normal. No respiratory distress.     Breath sounds: No wheezing or rales.  Chest:     Chest wall: No tenderness.  Abdominal:     General: Bowel sounds are normal.     Palpations: Abdomen is soft.  Musculoskeletal:        General: Normal range of motion.     Cervical back: Normal range of motion and neck supple.  Lymphadenopathy:     Cervical: No cervical adenopathy.  Skin:    General: Skin is warm and dry.  Neurological:     Mental Status: He is alert and oriented to person, place, and time.     Cranial Nerves: No cranial nerve deficit.  Psychiatric:        Behavior: Behavior normal.        Thought Content: Thought content normal.        Judgment: Judgment normal.       Assessment/Plan: 1. Essential hypertension Stable, continue current medication  2. 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 May also try tramadol as needed and will go ahead and place referral for pain clinic - Ambulatory referral to Pain Clinic - tiZANidine (ZANAFLEX) 4 MG tablet; Take 1 tablet (4 mg total) by mouth every 6 (six) hours as needed for muscle spasms.  Dispense: 30 tablet; Refill: 1 - traMADol (ULTRAM) 50 MG tablet; Take 1 tablet (50 mg total) by mouth every 12 (twelve) hours as needed for up to 5 days.  Dispense: 60 tablet; Refill: 0 - DULoxetine (CYMBALTA) 20 MG capsule; Take 1 capsule (20 mg total) by mouth daily.  Dispense: 30 capsule; Refill: 3 [place] Controlled Substance Database was reviewed by me for overdose risk score (ORS) Reviewed risks and possible side effects associated with taking opiates, benzodiazepines and other CNS depressants. Combination of these could cause dizziness and drowsiness. Advised patient not to drive or operate machinery when taking these medications, as patient's and other's life can be at risk and will have consequences. Patient verbalized understanding in this matter. Dependence and abuse for these drugs will be monitored closely. A Controlled substance policy and procedure is on file which allows [facility] to order a urine drug screen test at any visit. Patient understands and agrees with the plan  3. Neuropathy Will start cymbalta and take tramadol as needed, refer to pain management   General Counseling: farah benish understanding of the findings of todays visit and agrees with plan of treatment. I have discussed any further diagnostic evaluation that may be needed or ordered today. We also reviewed his medications today. he has been encouraged to call the office with any questions or concerns that should arise related to todays visit.    Orders  Placed This Encounter  Procedures   Ambulatory referral to Pain Clinic    Meds ordered this encounter  Medications   tiZANidine  (ZANAFLEX) 4 MG tablet    Sig: Take 1 tablet (4 mg total) by mouth every 6 (six) hours as needed for muscle spasms.    Dispense:  30 tablet    Refill:  1   traMADol (ULTRAM) 50 MG tablet    Sig: Take 1 tablet (50 mg total) by mouth every 12 (twelve) hours as needed for up to 5 days.    Dispense:  60 tablet    Refill:  0   DULoxetine (CYMBALTA) 20 MG capsule    Sig: Take 1 capsule (20 mg total) by mouth daily.    Dispense:  30 capsule    Refill:  3    This patient was seen by Drema Dallas, PA-C in collaboration with Dr. Clayborn Bigness as a part of collaborative care agreement.   Total time spent:35 Minutes Time spent includes review of chart, medications, test results, and follow up plan with the patient.      Dr Lavera Guise Internal medicine

## 2021-04-30 NOTE — Telephone Encounter (Signed)
Pain mgmt referral sent via [facility] to [facility]

## 2021-05-03 ENCOUNTER — Encounter: Payer: Self-pay | Admitting: Internal Medicine

## 2021-05-09 NOTE — Telephone Encounter (Signed)
Referral rejected by [facility]. Redirected referral to [facility] pain management via Epic-Toni

## 2021-05-11 ENCOUNTER — Encounter: Payer: Self-pay | Admitting: Nurse Practitioner

## 2021-05-11 ENCOUNTER — Inpatient Hospital Stay (HOSPITAL_BASED_OUTPATIENT_CLINIC_OR_DEPARTMENT_OTHER): Payer: Medicare HMO | Admitting: Nurse Practitioner

## 2021-05-11 ENCOUNTER — Inpatient Hospital Stay: Payer: Medicare HMO | Attending: Nurse Practitioner

## 2021-05-11 ENCOUNTER — Other Ambulatory Visit: Payer: Self-pay

## 2021-05-11 VITALS — BP 122/72 | HR 81 | Temp 97.8°F | Resp 20 | Wt 203.0 lb

## 2021-05-11 DIAGNOSIS — Z8589 Personal history of malignant neoplasm of other organs and systems: Secondary | ICD-10-CM

## 2021-05-11 DIAGNOSIS — G620 Drug-induced polyneuropathy: Secondary | ICD-10-CM

## 2021-05-11 DIAGNOSIS — G609 Hereditary and idiopathic neuropathy, unspecified: Secondary | ICD-10-CM | POA: Insufficient documentation

## 2021-05-11 DIAGNOSIS — Z08 Encounter for follow-up examination after completed treatment for malignant neoplasm: Secondary | ICD-10-CM | POA: Diagnosis not present

## 2021-05-11 DIAGNOSIS — T451X5A Adverse effect of antineoplastic and immunosuppressive drugs, initial encounter: Secondary | ICD-10-CM | POA: Diagnosis not present

## 2021-05-11 LAB — CBC WITH DIFFERENTIAL/PLATELET
Abs Immature Granulocytes: 0.01 10*3/uL (ref 0.00–0.07)
Basophils Absolute: 0 10*3/uL (ref 0.0–0.1)
Basophils Relative: 0 %
Eosinophils Absolute: 0.1 10*3/uL (ref 0.0–0.5)
Eosinophils Relative: 2 %
HCT: 45.7 % (ref 39.0–52.0)
Hemoglobin: 15.6 g/dL (ref 13.0–17.0)
Immature Granulocytes: 0 %
Lymphocytes Relative: 19 %
Lymphs Abs: 1.2 10*3/uL (ref 0.7–4.0)
MCH: 31.1 pg (ref 26.0–34.0)
MCHC: 34.1 g/dL (ref 30.0–36.0)
MCV: 91 fL (ref 80.0–100.0)
Monocytes Absolute: 0.6 10*3/uL (ref 0.1–1.0)
Monocytes Relative: 9 %
Neutro Abs: 4.7 10*3/uL (ref 1.7–7.7)
Neutrophils Relative %: 70 %
Platelets: 175 10*3/uL (ref 150–400)
RBC: 5.02 MIL/uL (ref 4.22–5.81)
RDW: 13.4 % (ref 11.5–15.5)
WBC: 6.7 10*3/uL (ref 4.0–10.5)
nRBC: 0 % (ref 0.0–0.2)

## 2021-05-11 LAB — COMPREHENSIVE METABOLIC PANEL
ALT: 18 U/L (ref 0–44)
AST: 25 U/L (ref 15–41)
Albumin: 4.5 g/dL (ref 3.5–5.0)
Alkaline Phosphatase: 61 U/L (ref 38–126)
Anion gap: 10 (ref 5–15)
BUN: 17 mg/dL (ref 8–23)
CO2: 30 mmol/L (ref 22–32)
Calcium: 9.3 mg/dL (ref 8.9–10.3)
Chloride: 95 mmol/L — ABNORMAL LOW (ref 98–111)
Creatinine, Ser: 1.18 mg/dL (ref 0.61–1.24)
GFR, Estimated: >60 mL/min (ref >60)
Glucose, Bld: 138 mg/dL — ABNORMAL HIGH (ref 70–99)
Potassium: 3.7 mmol/L (ref 3.5–5.1)
Sodium: 135 mmol/L (ref 135–145)
Total Bilirubin: 0.9 mg/dL (ref 0.3–1.2)
Total Protein: 7.7 g/dL (ref 6.5–8.1)

## 2021-05-11 MED ORDER — MAGIC MOUTHWASH W/LIDOCAINE: 5.0000 mL | Freq: Four times a day (QID) | ORAL | 2 refills | Status: DC | PRN

## 2021-05-11 NOTE — Progress Notes (Signed)
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  Pryor Ochoa for symptoms of ear pain and discharge.  Patient also noticed associated hearing loss and tinnitus.  Patient was noted to have erythema and edema in the left sphenopalatine fossa as well as an exophytic mass on NPL exam.  CT soft tissue of the neck showed asymmetric soft tissue within the left nasopharynx measuring 2.4 x 2 x 3.2 cm.  There is lateral bulging with encroachment upon the left parapharyngeal space without frank infiltration.  No extension into oropharynx or nasal cavity.  No skull base infiltration.  No appreciable mass within the larynx.  No pathologically enlarged cervical lymph nodes.  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  He was only able to receive 6 doses of weekly cisplatin 40 mg due to cytopenias and AKI   Interval history-patient continues to do well.  Denies difficulty swallowing.  He continues to gain weight after completing treatment.  He is now focusing on losing some weight intentionally and has lost 7  pounds.  Complains of mouth redness and soreness.  Is being refitted for dentures.  Says he has been treated for oral yeast several times.  No other specific complaints.  ECOG PS- 1 Pain scale- 0  Review of systems- Review of Systems  Constitutional:  Negative for chills, fever, malaise/fatigue and weight loss.  HENT:  Negative for congestion, ear discharge and nosebleeds.   Eyes:  Negative for blurred vision.  Respiratory:  Negative for cough, hemoptysis, sputum production, shortness of breath and wheezing.   Cardiovascular:  Negative for chest pain, palpitations, orthopnea and claudication.  Gastrointestinal:  Negative for abdominal pain, blood in stool, constipation, diarrhea, heartburn, melena, nausea and vomiting.  Genitourinary:  Negative for dysuria, flank pain, frequency, hematuria and urgency.  Musculoskeletal:  Negative for back pain, joint pain and myalgias.  Skin:  Negative for rash.  Neurological:  Negative for dizziness, tingling, focal weakness, seizures, weakness and headaches.  Endo/Heme/Allergies:  Does not bruise/bleed easily.  Psychiatric/Behavioral:  Negative for depression and suicidal ideas. The patient does not have insomnia.      Allergies  Allergen Reactions   Opana [Oxymorphone Hcl]     Made him BLACKOUT   Amoxicillin Other (See Comments)   Amoxicillin    Elemental Sulfur     Childhood reaction    Oxymorphone Other (See Comments)   Oxymorphone    Sulfa Antibiotics Other (See Comments)   Sulfa Antibiotics      Past Medical History:  Diagnosis Date   Alcohol abuse    Benzodiazepine dependence ([place])    Benzodiazepine withdrawal ([facility])    Chronic pain in right foot  COPD (chronic obstructive pulmonary disease) ([place])    Depression 08/19/2017   Depression    Hepatitis C    Hypertension    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[place])    Seizures ([place]) 2010   Seizures ([place])    Sleep apnea    Syncope    questionable vasovagal   Syncope      Past Surgical  History:  Procedure Laterality Date   COLONOSCOPY WITH PROPOFOL N/A 12/16/2016   Procedure: COLONOSCOPY WITH PROPOFOL;  Surgeon: Lollie Sails, MD;  Location: [facility] ENDOSCOPY;  Service: Endoscopy;  Laterality: N/A;   COLONOSCOPY WITH PROPOFOL N/A 03/14/2017   Procedure: COLONOSCOPY WITH PROPOFOL;  Surgeon: Lollie Sails, MD;  Location: [facility] ENDOSCOPY;  Service: Endoscopy;  Laterality: N/A;   COLONOSCOPY WITH PROPOFOL     FOOT SURGERY Right 03/12/2002   FOOT SURGERY     HEMORRHOID SURGERY     LITHOTRIPSY     MYRINGOTOMY     MYRINGOTOMY WITH TUBE PLACEMENT Left 04/28/2019   Procedure: MYRINGOTOMY WITH TUBE PLACEMENT;  Surgeon: Carloyn Manner, MD;  Location: [place];  Service: ENT;  Laterality: Left;   NASOPHARYNGOSCOPY N/A 04/28/2019   Procedure: NASOPHARYNGOSCOPY WITH BIOPSY;  Surgeon: Carloyn Manner, MD;  Location: [place];  Service: ENT;  Laterality: N/A;   NASOPHARYNGOSCOPY     Port a cath Placement     PORTA CATH INSERTION N/A 05/20/2019   Procedure: PORTA CATH INSERTION;  Surgeon: Algernon Huxley, MD;  Location: [place] CV LAB;  Service: Cardiovascular;  Laterality: N/A;   PORTA CATH REMOVAL N/A 11/29/2020   Procedure: PORTA CATH REMOVAL;  Surgeon: Algernon Huxley, MD;  Location: [place] CV LAB;  Service: Cardiovascular;  Laterality: N/A;   TONSILLECTOMY      Social History   Socioeconomic History   Marital status: Divorced    Spouse name: Not on file   Number of children: Not on file   Years of education: Not on file   Highest education level: Not on file  Occupational History   Not on file  Tobacco Use   Smoking status: Former    Packs/day: 0.50    Years: 50.00    Pack years: 25.00    Types: Cigarettes    Quit date: 02/05/2019    Years since quitting: 2.2   Smokeless tobacco: Former  Scientific laboratory technician Use: Never used  Substance and Sexual Activity   Alcohol use: Not Currently   Drug use: Not Currently   Sexual activity:  Not Currently  Other Topics Concern   Not on file  Social History Narrative   ** Merged History Encounter **       Social Determinants of Health   Financial Resource Strain: Not on file  Food Insecurity: Not on file  Transportation Needs: Not on file  Physical Activity: Not on file  Stress: Not on file  Social Connections: Not on file  Intimate Partner Violence: Not on file    Family History  Problem Relation Age of Onset   Arthritis Mother    Hypertension Mother    Cancer Father    Kidney disease Father    Alcohol abuse Paternal Aunt    Depression Maternal Grandfather      Current Outpatient Medications:    amLODipine (NORVASC) 5 MG tablet, Take 1 tablet (5 mg total) by mouth daily., Disp: 90 tablet, Rfl: 3   antiseptic oral rinse (BIOTENE) LIQD, 15 mLs by Mouth Rinse route as needed for dry mouth.,  Disp: , Rfl:    aspirin EC 81 MG tablet, Take 81 mg by mouth daily. Swallow whole., Disp: , Rfl:    Multiple Vitamin (MULTIVITAMIN WITH MINERALS) TABS tablet, Take 1 tablet by mouth daily., Disp: , Rfl:    rosuvastatin (CRESTOR) 5 MG tablet, Take 1 tablet (5 mg total) by mouth daily., Disp: 90 tablet, Rfl: 3   tiZANidine (ZANAFLEX) 4 MG tablet, Take 1 tablet (4 mg total) by mouth every 6 (six) hours as needed for muscle spasms., Disp: 30 tablet, Rfl: 1   DULoxetine (CYMBALTA) 20 MG capsule, Take 1 capsule (20 mg total) by mouth daily. (Patient not taking: Reported on 05/11/2021), Disp: 30 capsule, Rfl: 3 No current facility-administered medications for this visit.  Facility-Administered Medications Ordered in Other Visits:    heparin lock flush 100 unit/mL, 500 Units, Intravenous, Once, Sindy Guadeloupe, MD  Physical exam:  Vitals:   05/11/21 1050  BP: 122/72  Pulse: 81  Resp: 20  Temp: 97.8 F (36.6 C)  SpO2: 100%  Weight: 203 lb (92.1 kg)   Physical Exam HENT:     Mouth/Throat:     Mouth: Mucous membranes are moist.     Pharynx: Oropharynx is clear. Posterior  oropharyngeal erythema present.  Cardiovascular:     Rate and Rhythm: Normal rate and regular rhythm.     Heart sounds: Normal heart sounds.  Pulmonary:     Effort: Pulmonary effort is normal.     Breath sounds: Normal breath sounds.  Abdominal:     General: Bowel sounds are normal.     Palpations: Abdomen is soft.  Skin:    General: Skin is warm and dry.  Neurological:     Mental Status: He is alert and oriented to person, place, and time.     CMP Latest Ref Rng & Units 05/11/2021  Glucose 70 - 99 mg/dL 138(H)  BUN 8 - 23 mg/dL 17  Creatinine 0.61 - 1.24 mg/dL 1.18  Sodium 135 - 145 mmol/L 135  Potassium 3.5 - 5.1 mmol/L 3.7  Chloride 98 - 111 mmol/L 95(L)  CO2 22 - 32 mmol/L 30  Calcium 8.9 - 10.3 mg/dL 9.3  Total Protein 6.5 - 8.1 g/dL 7.7  Total Bilirubin 0.3 - 1.2 mg/dL 0.9  Alkaline Phos 38 - 126 U/L 61  AST 15 - 41 U/L 25  ALT 0 - 44 U/L 18   CBC Latest Ref Rng & Units 05/11/2021  WBC 4.0 - 10.5 K/uL 6.7  Hemoglobin 13.0 - 17.0 g/dL 15.6  Hematocrit 39.0 - 52.0 % 45.7  Platelets 150 - 400 K/uL 175      Assessment and plan- Patient is a 70 y.o. 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 This is a routine f/u visit  Clinically doing well without signs or symptoms of recurrence. Continue follow up with Dr. Pryor Ochoa and we can alternate visits. Labs today are reassuring. Gaining weight and eating and drinking well.   Etiology of oral erythema and pain is unclear. Will send prescription for magic mouthwash. Encouraged hygiene and moisturizing rinses.   CIPN- refer to Dr Mickeal Skinner. Was unable to tolerate cymbalta.   RTC in 6 months with labs (cbc, cmp)   Visit Diagnosis 1. Hereditary and idiopathic peripheral neuropathy     Beckey Rutter, DNP, AGNP-C [place] at [facility] [phone] (clinic) 05/11/2021

## 2021-05-11 NOTE — Progress Notes (Signed)
Patient states he stopped taking his Cymbalta on 12/28 because he started to get sick (Constipation, vomiting). Patient states he is miserable because of his gums are always hurting and he has really bad oral thrush. Patient states he is also having facial pain. Patient states he would like something for all this. Patient states his neuropathy is bothering him and nothing is working.

## 2021-05-15 ENCOUNTER — Other Ambulatory Visit: Payer: Self-pay | Admitting: Physician Assistant

## 2021-05-15 DIAGNOSIS — G629 Polyneuropathy, unspecified: Secondary | ICD-10-CM

## 2021-05-16 ENCOUNTER — Telehealth: Payer: Self-pay

## 2021-05-16 ENCOUNTER — Other Ambulatory Visit: Payer: Self-pay | Admitting: Physician Assistant

## 2021-05-16 DIAGNOSIS — G629 Polyneuropathy, unspecified: Secondary | ICD-10-CM

## 2021-05-16 DIAGNOSIS — M5442 Lumbago with sciatica, left side: Secondary | ICD-10-CM

## 2021-05-16 MED ORDER — GABAPENTIN 100 MG PO CAPS: 100.0000 mg | ORAL_CAPSULE | Freq: Three times a day (TID) | ORAL | 0 refills | Status: DC

## 2021-05-16 NOTE — Telephone Encounter (Signed)
LMOM that meds were sent to pharmacy

## 2021-05-18 ENCOUNTER — Telehealth: Payer: Self-pay | Admitting: *Deleted

## 2021-05-18 ENCOUNTER — Other Ambulatory Visit: Payer: Self-pay

## 2021-05-18 ENCOUNTER — Encounter: Payer: Self-pay | Admitting: Internal Medicine

## 2021-05-18 ENCOUNTER — Inpatient Hospital Stay: Payer: Medicare HMO | Attending: Internal Medicine | Admitting: Internal Medicine

## 2021-05-18 VITALS — BP 142/88 | HR 111 | Temp 97.7°F | Resp 16 | Wt 209.3 lb

## 2021-05-18 DIAGNOSIS — G629 Polyneuropathy, unspecified: Secondary | ICD-10-CM | POA: Diagnosis not present

## 2021-05-18 DIAGNOSIS — Z923 Personal history of irradiation: Secondary | ICD-10-CM | POA: Diagnosis not present

## 2021-05-18 DIAGNOSIS — G620 Drug-induced polyneuropathy: Secondary | ICD-10-CM | POA: Diagnosis not present

## 2021-05-18 DIAGNOSIS — T451X5A Adverse effect of antineoplastic and immunosuppressive drugs, initial encounter: Secondary | ICD-10-CM | POA: Diagnosis not present

## 2021-05-18 DIAGNOSIS — M5441 Lumbago with sciatica, right side: Secondary | ICD-10-CM

## 2021-05-18 DIAGNOSIS — Z85818 Personal history of malignant neoplasm of other sites of lip, oral cavity, and pharynx: Secondary | ICD-10-CM | POA: Diagnosis not present

## 2021-05-18 DIAGNOSIS — T451X5D Adverse effect of antineoplastic and immunosuppressive drugs, subsequent encounter: Secondary | ICD-10-CM | POA: Diagnosis not present

## 2021-05-18 DIAGNOSIS — Z79899 Other long term (current) drug therapy: Secondary | ICD-10-CM | POA: Diagnosis not present

## 2021-05-18 DIAGNOSIS — Z9221 Personal history of antineoplastic chemotherapy: Secondary | ICD-10-CM | POA: Insufficient documentation

## 2021-05-18 DIAGNOSIS — I10 Essential (primary) hypertension: Secondary | ICD-10-CM | POA: Insufficient documentation

## 2021-05-18 DIAGNOSIS — M5442 Lumbago with sciatica, left side: Secondary | ICD-10-CM | POA: Diagnosis not present

## 2021-05-18 DIAGNOSIS — Z7982 Long term (current) use of aspirin: Secondary | ICD-10-CM | POA: Insufficient documentation

## 2021-05-18 MED ORDER — GABAPENTIN 300 MG PO CAPS: 300.0000 mg | ORAL_CAPSULE | Freq: Three times a day (TID) | ORAL | 1 refills | Status: DC

## 2021-05-18 NOTE — Telephone Encounter (Signed)
Hello Dr. Mickeal Skinner, Were you planning to refer patient to pain clinic? If so would you like to put in the referral or would you like me to do so?

## 2021-05-18 NOTE — Progress Notes (Signed)
Pt c/o mouth/gum pain despite using magic mouthwash. Pt mom just passed away at 7:15 this morning in hospice.

## 2021-05-18 NOTE — Progress Notes (Signed)
[place] at [place], [place] [address] [phone]   New Patient Evaluation  Date of Service: 05/18/21 Patient Name: [name] Patient MRN: [id number] Patient DOB: 1951-03-09 Provider: Ventura Sellers, MD  Identifying Statement:  [name] is a 71 y.o. male with Chemotherapy-induced peripheral neuropathy [facility]) who presents for initial consultation and evaluation regarding cancer associated neurologic deficits.    Referring Provider: Lavera Guise, [address] [place],  [place] [address]  Primary Cancer:  Oncologic History: Oncology History  Nasopharyngeal carcinoma ([place])  05/11/2019 Initial Diagnosis   Nasopharyngeal carcinoma ([place])   06/08/2019 -  Chemotherapy   The patient had dexamethasone (DECADRON) 4 MG tablet, 1 of 1 cycle, Start date: 06/08/2019, End date: 08/30/2019 palonosetron (ALOXI) injection 0.25 mg, 0.25 mg, Intravenous,  Once, 6 of 7 cycles Administration: 0.25 mg (06/08/2019), 0.25 mg (06/15/2019), 0.25 mg (06/22/2019), 0.25 mg (06/29/2019), 0.25 mg (07/06/2019), 0.25 mg (07/27/2019) CISplatin (PLATINOL) 88 mg in sodium chloride 0.9 % 250 mL chemo infusion, 40 mg/m2 = 88 mg, Intravenous,  Once, 6 of 7 cycles Administration: 88 mg (06/08/2019), 88 mg (06/15/2019), 88 mg (06/22/2019), 88 mg (06/29/2019), 88 mg (07/06/2019), 88 mg (07/27/2019) fosaprepitant (EMEND) 150 mg in sodium chloride 0.9 % 145 mL IVPB, 150 mg, Intravenous,  Once, 6 of 7 cycles Administration: 150 mg (06/08/2019), 150 mg (06/15/2019), 150 mg (06/22/2019), 150 mg (06/29/2019), 150 mg (07/06/2019), 150 mg (07/27/2019)   for chemotherapy treatment.     06/08/2019 Cancer Staging   Staging form: Pharynx - Nasopharynx, AJCC 8th Edition - Clinical stage from 06/08/2019: Stage II (cT2, cN0, cM0) - Signed by Sindy Guadeloupe, MD on 06/10/2019      History of Present Illness: The patient's records from the referring physician were obtained  and reviewed and the patient interviewed to confirm this HPI.  [name] presents today to review neuropathy symptoms.  He describes pain, burning, stabbing and tingling affecting both feet, the tops more than the bottom.  Symptoms have been present for "a couple of years, since the chemo".  There is not a significant numbness component, his gait is unaffected.  There is additionally a component of burning pain affecting the face, both inside and outside around and inside the mouth.  This has been present since radiation.  Both painful regions lead to significant distress and interfere substantially with quality of life.  Gabapentin 100mg  3x per day has not been effective.  Medications: Current Outpatient Medications on File Prior to Visit  Medication Sig Dispense Refill   amLODipine (NORVASC) 5 MG tablet Take 1 tablet (5 mg total) by mouth daily. 90 tablet 3   antiseptic oral rinse (BIOTENE) LIQD 15 mLs by Mouth Rinse route as needed for dry mouth.     aspirin EC 81 MG tablet Take 81 mg by mouth daily. Swallow whole.     gabapentin (NEURONTIN) 100 MG capsule Take 1 capsule (100 mg total) by mouth 3 (three) times daily. 30 capsule 0   magic mouthwash w/lidocaine SOLN Take 5 mLs by mouth 4 (four) times daily as needed for mouth pain. 240 mL 2   Multiple Vitamin (MULTIVITAMIN WITH MINERALS) TABS tablet Take 1 tablet by mouth daily.     nystatin (MYCOSTATIN) 100000 UNIT/ML suspension Take by mouth.     rosuvastatin (CRESTOR) 5 MG tablet Take 1 tablet (5 mg total) by mouth daily. 90 tablet 3   tiZANidine (ZANAFLEX) 4  MG tablet Take 1 tablet (4 mg total) by mouth every 6 (six) hours as needed for muscle spasms. 30 tablet 1   DULoxetine (CYMBALTA) 20 MG capsule Take 1 capsule (20 mg total) by mouth daily. (Patient not taking: Reported on 05/18/2021) 30 capsule 3   Current Facility-Administered Medications on File Prior to Visit  Medication Dose Route Frequency Provider Last Rate Last Admin    heparin lock flush 100 unit/mL  500 Units Intravenous Once Sindy Guadeloupe, MD        Allergies:  Allergies  Allergen Reactions   Opana [Oxymorphone Hcl]     Made him BLACKOUT   Amoxicillin Other (See Comments)   Amoxicillin    Elemental Sulfur     Childhood reaction    Oxymorphone Other (See Comments)   Oxymorphone    Sulfa Antibiotics Other (See Comments)   Sulfa Antibiotics    Past Medical History:  Past Medical History:  Diagnosis Date   Alcohol abuse    Benzodiazepine dependence ([place])    Benzodiazepine withdrawal ([facility])    Chronic pain in right foot    COPD (chronic obstructive pulmonary disease) ([place])    Depression 08/19/2017   Depression    Hepatitis C    Hypertension    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[place])    Seizures ([place]) 2010   Seizures ([place])    Sleep apnea    Syncope    questionable vasovagal   Syncope    Past Surgical History:  Past Surgical History:  Procedure Laterality Date   COLONOSCOPY WITH PROPOFOL N/A 12/16/2016   Procedure: COLONOSCOPY WITH PROPOFOL;  Surgeon: Lollie Sails, MD;  Location: [facility] ENDOSCOPY;  Service: Endoscopy;  Laterality: N/A;   COLONOSCOPY WITH PROPOFOL N/A 03/14/2017   Procedure: COLONOSCOPY WITH PROPOFOL;  Surgeon: Lollie Sails, MD;  Location: [facility] ENDOSCOPY;  Service: Endoscopy;  Laterality: N/A;   COLONOSCOPY WITH PROPOFOL     FOOT SURGERY Right 03/12/2002   FOOT SURGERY     HEMORRHOID SURGERY     LITHOTRIPSY     MYRINGOTOMY     MYRINGOTOMY WITH TUBE PLACEMENT Left 04/28/2019   Procedure: MYRINGOTOMY WITH TUBE PLACEMENT;  Surgeon: Carloyn Manner, MD;  Location: [place];  Service: ENT;  Laterality: Left;   NASOPHARYNGOSCOPY N/A 04/28/2019   Procedure: NASOPHARYNGOSCOPY WITH BIOPSY;  Surgeon: Carloyn Manner, MD;  Location: [place];  Service: ENT;  Laterality: N/A;   NASOPHARYNGOSCOPY     Port a cath Placement     PORTA CATH INSERTION N/A 05/20/2019   Procedure: PORTA CATH INSERTION;  Surgeon: Algernon Huxley, MD;  Location: [place] CV LAB;  Service: Cardiovascular;  Laterality: N/A;   PORTA CATH REMOVAL N/A 11/29/2020   Procedure: PORTA CATH REMOVAL;  Surgeon: Algernon Huxley, MD;  Location: [place] CV LAB;  Service: Cardiovascular;  Laterality: N/A;   TONSILLECTOMY     Social History:  Social History   Socioeconomic History   Marital status: Divorced    Spouse name: Not on file   Number of children: Not on file   Years of education: Not on file   Highest education level: Not on file  Occupational History   Not on file  Tobacco Use   Smoking status: Former    Packs/day: 0.50    Years: 50.00    Pack years: 25.00    Types: Cigarettes    Quit date: 02/05/2019    Years since quitting: 2.2   Smokeless tobacco: Former  Media planner  Vaping Use: Never used  Substance and Sexual Activity   Alcohol use: Not Currently   Drug use: Not Currently   Sexual activity: Not Currently  Other Topics Concern   Not on file  Social History Narrative   ** Merged History Encounter **       Social Determinants of Health   Financial Resource Strain: Not on file  Food Insecurity: Not on file  Transportation Needs: Not on file  Physical Activity: Not on file  Stress: Not on file  Social Connections: Not on file  Intimate Partner Violence: Not on file   Family History:  Family History  Problem Relation Age of Onset   Arthritis Mother    Hypertension Mother    Cancer Father    Kidney disease Father    Alcohol abuse Paternal Aunt    Depression Maternal Grandfather     Review of Systems: Constitutional: Doesn't report fevers, chills or abnormal weight loss Eyes: Doesn't report blurriness of vision Ears, nose, mouth, throat, and face: Doesn't report sore throat Respiratory: Doesn't report cough, dyspnea or wheezes Cardiovascular: Doesn't report palpitation, chest discomfort  Gastrointestinal:  Doesn't report nausea, constipation, diarrhea GU: Doesn't report  incontinence Skin: Doesn't report skin rashes Neurological: Per HPI Musculoskeletal: Doesn't report joint pain Behavioral/Psych: Doesn't report anxiety  Physical Exam: Vitals:   05/18/21 0854  BP: (!) 142/88  Pulse: (!) 111  Resp: 16  Temp: 97.7 F (36.5 C)  SpO2: 98%   KPS: 90. General: Alert, cooperative, pleasant, in no acute distress Head: Normal EENT: No conjunctival injection or scleral icterus.  Lungs: Resp effort normal Cardiac: Regular rate Abdomen: Non-distended abdomen Skin: No rashes cyanosis or petechiae. Extremities: No clubbing or edema  Neurologic Exam: Mental Status: Awake, alert, attentive to examiner. Oriented to self and environment. Language is fluent with intact comprehension.  Cranial Nerves: Visual acuity is grossly normal. Visual fields are full. Extra-ocular movements intact. No ptosis. Face is symmetric Motor: Tone and bulk are normal. Power is full in both arms and legs. Reflexes are symmetric, no pathologic reflexes present.  Sensory: Intact to light touch Gait: Normal.   Labs: I have reviewed the data as listed    Component Value Date/Time   NA 135 05/11/2021 1038   NA 139 02/26/2018 1540   NA 139 12/31/2013 1207   K 3.7 05/11/2021 1038   K 3.8 12/31/2013 1207   CL 95 (L) 05/11/2021 1038   CL 101 12/31/2013 1207   CO2 30 05/11/2021 1038   CO2 28 12/31/2013 1207   GLUCOSE 138 (H) 05/11/2021 1038   GLUCOSE 134 (H) 12/31/2013 1207   BUN 17 05/11/2021 1038   BUN 12 02/26/2018 1540   BUN 8 12/31/2013 1207   CREATININE 1.18 05/11/2021 1038   CREATININE 1.03 12/31/2013 1207   CALCIUM 9.3 05/11/2021 1038   CALCIUM 8.8 12/31/2013 1207   PROT 7.7 05/11/2021 1038   PROT 8.1 02/26/2018 1540   PROT 8.4 (H) 12/31/2013 1207   ALBUMIN 4.5 05/11/2021 1038   ALBUMIN 4.9 (H) 02/26/2018 1540   ALBUMIN 4.3 12/31/2013 1207   AST 25 05/11/2021 1038   AST 48 (H) 12/31/2013 1207   ALT 18 05/11/2021 1038   ALT 33 12/31/2013 1207   ALKPHOS 61  05/11/2021 1038   ALKPHOS 66 12/31/2013 1207   BILITOT 0.9 05/11/2021 1038   BILITOT 0.6 02/26/2018 1540   BILITOT 0.8 12/31/2013 1207   GFRNONAA >60 05/11/2021 1038   GFRNONAA >60 12/31/2013 1207   GFRAA >60  12/11/2019 0531   GFRAA >60 12/31/2013 1207   Lab Results  Component Value Date   WBC 6.7 05/11/2021   NEUTROABS 4.7 05/11/2021   HGB 15.6 05/11/2021   HCT 45.7 05/11/2021   MCV 91.0 05/11/2021   PLT 175 05/11/2021     Assessment/Plan Chemotherapy-induced peripheral neuropathy ([place])  [name] presents with clinical syndrome consistent with symmetric, length dependent, small and large fiber peripheral neuropathy.  Etiology is exposure to chemotherapy, cisplatin.  Facial component is secondary to radiation and chemotherapy both.  We reviewed pathophysiology of chemotherapy induced neuropathy, available treatments, and goals of care.  First recommendation is proper titration of gabapentin, as lower dose has been ineffective.  He may increase to 300mg  TID, then increase 600mg  TID if still ineffective after ~3 weeks.  He understands we may have to try more than one medication to address the symptoms optimally.  He previously did not tolerate cymbalta.  Tegretol or Trileptal could be considered for facial pain.  We spent twenty additional minutes teaching regarding the natural history, biology, and historical experience in the treatment of neurologic complications of cancer.   We appreciate the opportunity to participate in the care of [name].  We will touch base with him in 1-2 months to assess response to med adjustments today.   All questions were answered. The patient knows to call the clinic with any problems, questions or concerns. No barriers to learning were detected.  The total time spent in the encounter was 40 minutes and more than 50% was on counseling and review of test results   Ventura Sellers, MD Medical Director of  Neuro-Oncology [facility] [place] 05/18/21 9:45 AM

## 2021-05-18 NOTE — Telephone Encounter (Signed)
Patient called requesting to have a referral to Pain Management Clinic and would like a return call to discuss this request. States he saw neurologist today in our office

## 2021-05-25 ENCOUNTER — Other Ambulatory Visit: Payer: Self-pay

## 2021-05-25 DIAGNOSIS — I10 Essential (primary) hypertension: Secondary | ICD-10-CM

## 2021-05-25 MED ORDER — AMLODIPINE BESYLATE 5 MG PO TABS: 5.0000 mg | ORAL_TABLET | Freq: Every day | ORAL | 1 refills | Status: DC

## 2021-06-01 ENCOUNTER — Encounter: Payer: Self-pay | Admitting: Nurse Practitioner

## 2021-06-05 ENCOUNTER — Encounter: Payer: Self-pay | Admitting: Oncology

## 2021-06-14 ENCOUNTER — Encounter: Payer: Self-pay | Admitting: Oncology

## 2021-06-15 NOTE — Telephone Encounter (Signed)
Speak to pcp. Does he have one?

## 2021-06-28 ENCOUNTER — Ambulatory Visit (INDEPENDENT_AMBULATORY_CARE_PROVIDER_SITE_OTHER): Payer: Medicare HMO | Admitting: Physician Assistant

## 2021-06-28 ENCOUNTER — Encounter: Payer: Self-pay | Admitting: Physician Assistant

## 2021-06-28 ENCOUNTER — Other Ambulatory Visit: Payer: Self-pay

## 2021-06-28 DIAGNOSIS — M5441 Lumbago with sciatica, right side: Secondary | ICD-10-CM | POA: Diagnosis not present

## 2021-06-28 DIAGNOSIS — R69 Illness, unspecified: Secondary | ICD-10-CM | POA: Diagnosis not present

## 2021-06-28 DIAGNOSIS — I10 Essential (primary) hypertension: Secondary | ICD-10-CM | POA: Diagnosis not present

## 2021-06-28 DIAGNOSIS — F411 Generalized anxiety disorder: Secondary | ICD-10-CM | POA: Diagnosis not present

## 2021-06-28 DIAGNOSIS — K219 Gastro-esophageal reflux disease without esophagitis: Secondary | ICD-10-CM

## 2021-06-28 DIAGNOSIS — M5442 Lumbago with sciatica, left side: Secondary | ICD-10-CM | POA: Diagnosis not present

## 2021-06-28 DIAGNOSIS — G629 Polyneuropathy, unspecified: Secondary | ICD-10-CM | POA: Diagnosis not present

## 2021-06-28 MED ORDER — HYDROXYZINE PAMOATE 25 MG PO CAPS: 25.0000 mg | ORAL_CAPSULE | Freq: Three times a day (TID) | ORAL | 0 refills | Status: DC | PRN

## 2021-06-28 MED ORDER — OMEPRAZOLE 40 MG PO CPDR: 40.0000 mg | DELAYED_RELEASE_CAPSULE | Freq: Every day | ORAL | 3 refills | Status: DC

## 2021-06-28 MED ORDER — TIZANIDINE HCL 4 MG PO TABS: 4.0000 mg | ORAL_TABLET | Freq: Four times a day (QID) | ORAL | 1 refills | Status: DC | PRN

## 2021-06-28 NOTE — Progress Notes (Signed)
[facility] [place], [place] [address]  Internal MEDICINE  Office Visit Note  Patient Name: [name]  [id number]  [id number]  Date of Service: 07/08/2021  Chief Complaint  Patient presents with   Follow-up   Depression   Hypertension   Referral    Pain management would not accept patient and did not give a reason for it   Quality Metric Gaps    Shingles and Pneumonia vaccine    HPI Pt is here for routine follow up -Pain management denied his request, stated they called and told him but never heard anything and is unsure why he was denied. -he did have a visit with neuro oncology however and has been taking 300mg  gabapentin 6 times per day? Rather than 600mg  TID. This was reached via titration schedule. Has follow up with him tomorrow for phone call. States he has gained weight since starting this and has not really helped. -Cymbalta made him nauseous -Lost his mother on 05/18/21 and has been a stressful time. More difficulty sleeping sometimes due to this. States he has tried many anxiety medications in the past that he did not do well with them -a little bit of right sided chest wall pain at times  Current Medication: Outpatient Encounter Medications as of 06/28/2021  Medication Sig   amLODipine (NORVASC) 5 MG tablet Take 1 tablet (5 mg total) by mouth daily.   antiseptic oral rinse (BIOTENE) LIQD 15 mLs by Mouth Rinse route as needed for dry mouth.   aspirin EC 81 MG tablet Take 81 mg by mouth daily. Swallow whole.   hydrOXYzine (VISTARIL) 25 MG capsule Take 1 capsule (25 mg total) by mouth every 8 (eight) hours as needed.   magic mouthwash w/lidocaine SOLN Take 5 mLs by mouth 4 (four) times daily as needed for mouth pain.   Multiple Vitamin (MULTIVITAMIN WITH MINERALS) TABS tablet Take 1 tablet by mouth daily.   nystatin (MYCOSTATIN) 100000 UNIT/ML suspension Take by mouth.   omeprazole (PRILOSEC) 40 MG capsule Take 1 capsule (40 mg  total) by mouth daily.   rosuvastatin (CRESTOR) 5 MG tablet Take 1 tablet (5 mg total) by mouth daily.   [DISCONTINUED] DULoxetine (CYMBALTA) 20 MG capsule Take 1 capsule (20 mg total) by mouth daily.   [DISCONTINUED] gabapentin (NEURONTIN) 300 MG capsule Take 1 capsule (300 mg total) by mouth 3 (three) times daily. May increase to 600mg  3 times daily after 3 weeks if pain not controlled   [DISCONTINUED] tiZANidine (ZANAFLEX) 4 MG tablet Take 1 tablet (4 mg total) by mouth every 6 (six) hours as needed for muscle spasms.   tiZANidine (ZANAFLEX) 4 MG tablet Take 1 tablet (4 mg total) by mouth every 6 (six) hours as needed for muscle spasms.   Facility-Administered Encounter Medications as of 06/28/2021  Medication   heparin lock flush 100 unit/mL    Surgical History: Past Surgical History:  Procedure Laterality Date   COLONOSCOPY WITH PROPOFOL N/A 12/16/2016   Procedure: COLONOSCOPY WITH PROPOFOL;  Surgeon: Lollie Sails, MD;  Location: [facility] ENDOSCOPY;  Service: Endoscopy;  Laterality: N/A;   COLONOSCOPY WITH PROPOFOL N/A 03/14/2017   Procedure: COLONOSCOPY WITH PROPOFOL;  Surgeon: Lollie Sails, MD;  Location: [facility] ENDOSCOPY;  Service: Endoscopy;  Laterality: N/A;   COLONOSCOPY WITH PROPOFOL     FOOT SURGERY Right 03/12/2002   FOOT SURGERY     HEMORRHOID SURGERY     LITHOTRIPSY     MYRINGOTOMY  MYRINGOTOMY WITH TUBE PLACEMENT Left 04/28/2019   Procedure: MYRINGOTOMY WITH TUBE PLACEMENT;  Surgeon: Carloyn Manner, MD;  Location: [facility];  Service: ENT;  Laterality: Left;   NASOPHARYNGOSCOPY N/A 04/28/2019   Procedure: NASOPHARYNGOSCOPY WITH BIOPSY;  Surgeon: Carloyn Manner, MD;  Location: [place];  Service: ENT;  Laterality: N/A;   NASOPHARYNGOSCOPY     Port a cath Placement     PORTA CATH INSERTION N/A 05/20/2019   Procedure: PORTA CATH INSERTION;  Surgeon: Algernon Huxley, MD;  Location: [place] CV [facility];  Service: Cardiovascular;  Laterality:  N/A;   PORTA CATH REMOVAL N/A 11/29/2020   Procedure: PORTA CATH REMOVAL;  Surgeon: Algernon Huxley, MD;  Location: [place] [facility];  Service: Cardiovascular;  Laterality: N/A;   TONSILLECTOMY      Medical History: Past Medical History:  Diagnosis Date   Alcohol abuse    Benzodiazepine dependence (HCC)    Benzodiazepine withdrawal (HCC)    Chronic pain in right foot    COPD (chronic obstructive pulmonary disease) ([place])    Depression 08/19/2017   Depression    Hepatitis C    Hypertension    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HCC)    Seizures ([place]) 2010   Seizures (HCC)    Sleep apnea    Syncope    questionable vasovagal   Syncope     Family History: Family History  Problem Relation Age of Onset   Arthritis Mother    Hypertension Mother    Cancer Father    Kidney disease Father    Alcohol abuse Paternal Aunt    Depression Maternal Grandfather     Social History   Socioeconomic History   Marital status: Divorced    Spouse name: Not on file   Number of children: Not on file   Years of education: Not on file   Highest education level: Not on file  Occupational History   Not on file  Tobacco Use   Smoking status: Former    Packs/day: 0.50    Years: 50.00    Pack years: 25.00    Types: Cigarettes    Quit date: 02/05/2019    Years since quitting: 2.4   Smokeless tobacco: Former  Scientific laboratory technician Use: Never used  Substance and Sexual Activity   Alcohol use: Not Currently   Drug use: Not Currently   Sexual activity: Not Currently  Other Topics Concern   Not on file  Social History Narrative   ** Merged History Encounter **       Social Determinants of Health   Financial Resource Strain: Not on file  Food Insecurity: Not on file  Transportation Needs: Not on file  Physical Activity: Not on file  Stress: Not on file  Social Connections: Not on file  Intimate Partner Violence: Not on file      Review of Systems  Constitutional:  Negative for chills, fatigue  and unexpected weight change.  HENT:  Negative for congestion, postnasal drip, rhinorrhea, sneezing and sore throat.   Eyes:  Negative for redness.  Respiratory:  Negative for cough, chest tightness and shortness of breath.   Cardiovascular:  Negative for chest pain and palpitations.  Gastrointestinal:  Negative for abdominal pain, constipation, diarrhea, nausea and vomiting.  Genitourinary:  Negative for dysuria and frequency.  Musculoskeletal:  Positive for arthralgias and back pain. Negative for joint swelling and neck pain.  Skin:  Negative for rash.  Neurological: Negative.  Negative for tremors and numbness.  Neuralgia in both feet  Hematological:  Negative for adenopathy. Does not bruise/bleed easily.  Psychiatric/Behavioral:  Negative for behavioral problems (Depression), sleep disturbance and suicidal ideas. The patient is nervous/anxious.    Vital Signs: BP 136/81    Pulse 68    Temp 98.2 F (36.8 C)    Resp 16    Ht 5\' 9"  (1.753 m)    Wt 218 lb (98.9 kg)    SpO2 97%    BMI 32.19 kg/m    Physical Exam Vitals and nursing note reviewed.  Constitutional:      General: He is not in acute distress.    Appearance: He is well-developed. He is not diaphoretic.  HENT:     Head: Normocephalic and atraumatic.     Mouth/Throat:     Pharynx: No oropharyngeal exudate.  Eyes:     Pupils: Pupils are equal, round, and reactive to light.  Neck:     Thyroid: No thyromegaly.     Vascular: No JVD.     Trachea: No tracheal deviation.  Cardiovascular:     Rate and Rhythm: Normal rate and regular rhythm.     Heart sounds: Normal heart sounds. No murmur heard.   No friction rub. No gallop.  Pulmonary:     Effort: Pulmonary effort is normal. No respiratory distress.     Breath sounds: No wheezing or rales.  Chest:     Chest wall: No tenderness.  Abdominal:     General: Bowel sounds are normal.     Palpations: Abdomen is soft.  Musculoskeletal:        General: Normal range of  motion.     Cervical back: Normal range of motion and neck supple.  Lymphadenopathy:     Cervical: No cervical adenopathy.  Skin:    General: Skin is warm and dry.  Neurological:     Mental Status: He is alert and oriented to person, place, and time.     Cranial Nerves: No cranial nerve deficit.  Psychiatric:        Behavior: Behavior normal.        Thought Content: Thought content normal.        Judgment: Judgment normal.       Assessment/Plan: 1. Essential hypertension Stable, continue current medications  2. 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  Dispense: 30 tablet; Refill: 1  3. Neuropathy Followed by neurooncology  4. GAD (generalized anxiety disorder) May try hydroxyzine as needed - hydrOXYzine (VISTARIL) 25 MG capsule; Take 1 capsule (25 mg total) by mouth every 8 (eight) hours as needed.  Dispense: 30 capsule; Refill: 0  5. Gastroesophageal reflux disease without esophagitis - omeprazole (PRILOSEC) 40 MG capsule; Take 1 capsule (40 mg total) by mouth daily.  Dispense: 30 capsule; Refill: 3   General Counseling: Zackary verbalizes understanding of the findings of todays visit and agrees with plan of treatment. I have discussed any further diagnostic evaluation that may be needed or ordered today. We also reviewed his medications today. he has been encouraged to call the office with any questions or concerns that should arise related to todays visit.    No orders of the defined types were placed in this encounter.   Meds ordered this encounter  Medications   omeprazole (PRILOSEC) 40 MG capsule  Sig: Take 1 capsule (40 mg total) by mouth daily.    Dispense:  30 capsule    Refill:  3   tiZANidine (ZANAFLEX) 4 MG tablet    Sig: Take 1 tablet (4 mg total) by mouth  every 6 (six) hours as needed for muscle spasms.    Dispense:  30 tablet    Refill:  1   hydrOXYzine (VISTARIL) 25 MG capsule    Sig: Take 1 capsule (25 mg total) by mouth every 8 (eight) hours as needed.    Dispense:  30 capsule    Refill:  0    This patient was seen by Drema Dallas, PA-C in collaboration with Dr. Clayborn Bigness as a part of collaborative care agreement.   Total time spent:30 Minutes Time spent includes review of chart, medications, test results, and follow up plan with the patient.      Dr Lavera Guise Internal medicine

## 2021-06-29 ENCOUNTER — Telehealth: Payer: Self-pay | Admitting: Internal Medicine

## 2021-06-29 ENCOUNTER — Telehealth: Payer: Self-pay | Admitting: *Deleted

## 2021-06-29 ENCOUNTER — Inpatient Hospital Stay: Payer: Medicare HMO | Attending: Nurse Practitioner | Admitting: Internal Medicine

## 2021-06-29 DIAGNOSIS — T451X5D Adverse effect of antineoplastic and immunosuppressive drugs, subsequent encounter: Secondary | ICD-10-CM | POA: Diagnosis not present

## 2021-06-29 DIAGNOSIS — G620 Drug-induced polyneuropathy: Secondary | ICD-10-CM

## 2021-06-29 MED ORDER — CARBAMAZEPINE ER 100 MG PO TB12: 100.0000 mg | ORAL_TABLET | Freq: Two times a day (BID) | ORAL | 3 refills | Status: DC

## 2021-06-29 NOTE — Progress Notes (Signed)
I connected with [name] on 06/29/21 at  9:00 AM EST by telephone visit and verified that I am speaking with the correct person using two identifiers.  I discussed the limitations, risks, security and privacy concerns of performing an evaluation and management service by telemedicine and the availability of in-person appointments. I also discussed with the patient that there may be a patient responsible charge related to this service. The patient expressed understanding and agreed to proceed.  Other persons participating in the visit and their role in the encounter:  n/a  Patient's location:  Home  Provider's location:  Office  Chief Complaint:  Chemotherapy-induced peripheral neuropathy ([place])  History of Present Ilness: [name] describes zero improvement in length dependent and facial neuropathic symptoms since increasing the gabapentin.  He is currently dosing 600mg  three times per day.  Previously, he had side effects with both Cymbalta and Lyrica.  Remains off chemotherapy. Observations: Language and cognition at baseline Assessment and Plan: Chemotherapy-induced peripheral neuropathy (HCC)  Clinically not improved.  He would not like to transition to Lyrica at this time because of nasuea previously.  Because of predominant facial pain component, we discussed trail of Tegretol, which can also be effective for neuropathic pain.  He is agreeable with this plan.    Will start with 100mg  BID Carbamezapine, uptitrate from there.  Follow Up Instructions: RTC via phone in 3 weeks for further med adjustment  I discussed the assessment and treatment plan with the patient.  The patient was provided an opportunity to ask questions and all were answered.  The patient agreed with the plan and demonstrated understanding of the instructions.    The patient was advised to call back or seek an in-person evaluation if the symptoms worsen or if the condition fails to improve as  anticipated.  I provided 5-10 minutes of non-face-to-face time during this enocunter.  Ventura Sellers, MD   I provided 15 minutes of non face-to-face telephone visit time during this encounter, and > 50% was spent counseling as documented under my assessment & plan.

## 2021-06-29 NOTE — Telephone Encounter (Addendum)
Patient called stating after reading the side effects of the new medicine Dr Mickeal Skinner prescribed, he does not want to take it.

## 2021-07-02 ENCOUNTER — Encounter: Payer: Self-pay | Admitting: Oncology

## 2021-07-02 ENCOUNTER — Encounter: Payer: Self-pay | Admitting: Internal Medicine

## 2021-07-10 ENCOUNTER — Telehealth: Payer: Self-pay

## 2021-07-10 ENCOUNTER — Other Ambulatory Visit: Payer: Self-pay | Admitting: Physician Assistant

## 2021-07-10 DIAGNOSIS — M79671 Pain in right foot: Secondary | ICD-10-CM

## 2021-07-10 DIAGNOSIS — G629 Polyneuropathy, unspecified: Secondary | ICD-10-CM

## 2021-07-10 NOTE — Telephone Encounter (Signed)
Per patient's request, podiatry referral sent via proficient to [facility]

## 2021-07-13 ENCOUNTER — Telehealth: Payer: Self-pay

## 2021-07-13 NOTE — Telephone Encounter (Signed)
I called patient to make sure he was aware of his upcoming appointment. Patient stated he knew about the appointment. Patient also stated that he was wondering what he could take since he didn't want to take the medication prescribed.  ?

## 2021-07-16 ENCOUNTER — Encounter: Payer: Self-pay | Admitting: Oncology

## 2021-07-18 ENCOUNTER — Other Ambulatory Visit: Payer: Self-pay | Admitting: Physician Assistant

## 2021-07-18 ENCOUNTER — Telehealth: Payer: Self-pay

## 2021-07-18 ENCOUNTER — Other Ambulatory Visit: Payer: Self-pay

## 2021-07-18 DIAGNOSIS — F411 Generalized anxiety disorder: Secondary | ICD-10-CM

## 2021-07-18 DIAGNOSIS — M5441 Lumbago with sciatica, right side: Secondary | ICD-10-CM

## 2021-07-18 MED ORDER — HYDROXYZINE PAMOATE 25 MG PO CAPS: 25.0000 mg | ORAL_CAPSULE | Freq: Three times a day (TID) | ORAL | 0 refills | Status: DC | PRN

## 2021-07-18 NOTE — Telephone Encounter (Signed)
Spoke to pt, informed him refill was sent to pharmacy  ?

## 2021-07-19 ENCOUNTER — Encounter: Payer: Self-pay | Admitting: Podiatry

## 2021-07-19 ENCOUNTER — Ambulatory Visit (INDEPENDENT_AMBULATORY_CARE_PROVIDER_SITE_OTHER): Payer: Medicare HMO | Admitting: Podiatry

## 2021-07-19 ENCOUNTER — Other Ambulatory Visit: Payer: Self-pay

## 2021-07-19 DIAGNOSIS — M792 Neuralgia and neuritis, unspecified: Secondary | ICD-10-CM | POA: Diagnosis not present

## 2021-07-19 DIAGNOSIS — L853 Xerosis cutis: Secondary | ICD-10-CM | POA: Diagnosis not present

## 2021-07-19 MED ORDER — NONFORMULARY OR COMPOUNDED ITEM: 2 refills | Status: DC

## 2021-07-19 MED ORDER — AMMONIUM LACTATE 12 % EX LOTN: 1.0000 "application " | TOPICAL_LOTION | CUTANEOUS | 0 refills | Status: DC | PRN

## 2021-07-19 NOTE — Addendum Note (Signed)
Addended by: Graceann Congress D on: 07/19/2021 01:34 PM ? ? Modules accepted: Orders ? ?

## 2021-07-19 NOTE — Progress Notes (Signed)
?Subjective:  ?Patient ID: [name], male    DOB: [birth year],  MRN: [id number] ? ?Chief Complaint  ?Patient presents with  ? Foot Pain  ?  Pati  ? Callouses  ?  Callus trim, RFC  Patient c/o of neuopathy, numbness, tingling, prickly feeling in both feet all the time  ? ? ?71 y.o. male presents with the above complaint.  Patient presents with complaint of neuropathy numbness tingling.  He states that he is a chemo patient which is where his neuropathy is coming from.  He is on gabapentin and try Lyrica none of which has helped.  He wanted to discuss other treatment options if there were any.  He states it hurts with ambulation he feels prickly feeling all the time.  He denies seeing anyone else prior to seeing me for this. ? ? ?Review of Systems: Negative except as noted in the HPI. Denies N/V/F/Ch. ? ?Past Medical History:  ?Diagnosis Date  ? Alcohol abuse   ? Benzodiazepine dependence ([place])   ? Benzodiazepine withdrawal ([place])   ? Chronic pain in right foot   ? COPD (chronic obstructive pulmonary disease) ([place])   ? Depression 08/19/2017  ? Depression   ? Hepatitis C   ? Hypertension   ? 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[place])   ? Seizures ([place]) 2010  ? Seizures ([place])   ? Sleep apnea   ? Syncope   ? questionable vasovagal  ? Syncope   ? ? ?Current Outpatient Medications:  ?  ammonium lactate (AMLACTIN) 12 % lotion, Apply 1 application. topically as needed for dry skin., Disp: 400 g, Rfl: 0 ?  amLODipine (NORVASC) 5 MG tablet, Take 1 tablet (5 mg total) by mouth daily., Disp: 90 tablet, Rfl: 1 ?  antiseptic oral rinse (BIOTENE) LIQD, 15 mLs by Mouth Rinse route as needed for dry mouth., Disp: , Rfl:  ?  aspirin EC 81 MG tablet, Take 81 mg by mouth daily. Swallow whole., Disp: , Rfl:  ?  carbamazepine (TEGRETOL XR) 100 MG 12 hr tablet, Take 1 tablet (100 mg total) by mouth 2 (two) times daily. (Patient not taking: Reported on 07/19/2021), Disp: 60 tablet, Rfl: 3 ?  hydrOXYzine (VISTARIL) 25 MG capsule, Take 1 capsule  (25 mg total) by mouth every 8 (eight) hours as needed., Disp: 30 capsule, Rfl: 0 ?  magic mouthwash w/lidocaine SOLN, Take 5 mLs by mouth 4 (four) times daily as needed for mouth pain., Disp: 240 mL, Rfl: 2 ?  Multiple Vitamin (MULTIVITAMIN WITH MINERALS) TABS tablet, Take 1 tablet by mouth daily., Disp: , Rfl:  ?  nystatin (MYCOSTATIN) 100000 UNIT/ML suspension, Take by mouth., Disp: , Rfl:  ?  omeprazole (PRILOSEC) 40 MG capsule, Take 1 capsule (40 mg total) by mouth daily., Disp: 30 capsule, Rfl: 3 ?  rosuvastatin (CRESTOR) 5 MG tablet, Take 1 tablet (5 mg total) by mouth daily., Disp: 90 tablet, Rfl: 3 ?  tiZANidine (ZANAFLEX) 4 MG tablet, TAKE 1 TABLET BY MOUTH EVERY 6 HOURS AS NEEDED FOR MUSCLE SPASMS, Disp: 30 tablet, Rfl: 1 ?No current facility-administered medications for this visit. ? ?Facility-Administered Medications Ordered in Other Visits:  ?  heparin lock flush 100 unit/mL, 500 Units, Intravenous, Once, Sindy Guadeloupe, MD ? ?Social History  ? ?Tobacco Use  ?Smoking Status Former  ? Packs/day: 0.50  ? Years: 50.00  ? Pack years: 25.00  ? Types: Cigarettes  ? Quit date: 02/05/2019  ? Years since quitting: 2.4  ?Smokeless Tobacco Former  ? ? ?  Allergies  ?Allergen Reactions  ? Opana [Oxymorphone Hcl]   ?  Made him BLACKOUT  ? Amoxicillin Other (See Comments)  ? Amoxicillin   ? Elemental Sulfur   ?  Childhood reaction   ? Oxymorphone Other (See Comments)  ? Oxymorphone   ? Sulfa Antibiotics Other (See Comments)  ? Sulfa Antibiotics   ? ?Objective:  ?There were no vitals filed for this visit. ?There is no height or weight on file to calculate BMI. ?Constitutional Well developed. ?Well nourished.  ?Vascular Dorsalis pedis pulses palpable bilaterally. ?Posterior tibial pulses palpable bilaterally. ?Capillary refill normal to all digits.  ?No cyanosis or clubbing noted. ?Pedal hair growth normal.  ?Neurologic Normal speech. ?Oriented to person, place, and time. ?Epicritic sensation to light touch grossly  present bilaterally.  Subjective, numbness tingling burning noted.  Protective sensation is intact.  ?Dermatologic Nails well groomed and normal in appearance. ?No open wounds. ?Dry skin/xerosis noted to bilateral heel.  No skin fissures noted.  ?Orthopedic: Manual muscle strength within normal limits.  Pes cavus foot structure noted.  ? ?Radiographs: None ?Assessment:  ? ?1. Neuropathic pain   ?2. Xerosis of skin   ? ?Plan:  ?Patient was evaluated and treated and all questions answered. ? ?Neuropathic pain with a history of chemotherapy ?-All questions and concerns were discussed with the patient in extensive detail.  Given the amount of pain that is having I believe he will benefit from specialty cream/compound cream from pharmacy.  I will place the order for this.  I discussed with the patient he states understanding. ?-He is also dealing with some dry skin to bilateral heel.  I believe he will benefit from ammonium lactate.  Ammonium lactate was sent to the pharmacy. ? ?No follow-ups on file.  ?

## 2021-07-20 ENCOUNTER — Telehealth: Payer: Medicare HMO | Admitting: Internal Medicine

## 2021-07-20 ENCOUNTER — Inpatient Hospital Stay: Payer: Medicare HMO | Attending: Internal Medicine | Admitting: Internal Medicine

## 2021-07-20 VITALS — BP 109/73 | HR 68 | Temp 96.0°F | Resp 17 | Wt 219.0 lb

## 2021-07-20 DIAGNOSIS — Z7982 Long term (current) use of aspirin: Secondary | ICD-10-CM | POA: Diagnosis not present

## 2021-07-20 DIAGNOSIS — Z85118 Personal history of other malignant neoplasm of bronchus and lung: Secondary | ICD-10-CM | POA: Diagnosis not present

## 2021-07-20 DIAGNOSIS — Z79899 Other long term (current) drug therapy: Secondary | ICD-10-CM | POA: Insufficient documentation

## 2021-07-20 DIAGNOSIS — Z923 Personal history of irradiation: Secondary | ICD-10-CM | POA: Diagnosis not present

## 2021-07-20 DIAGNOSIS — T451X5A Adverse effect of antineoplastic and immunosuppressive drugs, initial encounter: Secondary | ICD-10-CM

## 2021-07-20 DIAGNOSIS — I10 Essential (primary) hypertension: Secondary | ICD-10-CM | POA: Insufficient documentation

## 2021-07-20 DIAGNOSIS — T451X5D Adverse effect of antineoplastic and immunosuppressive drugs, subsequent encounter: Secondary | ICD-10-CM | POA: Insufficient documentation

## 2021-07-20 DIAGNOSIS — G620 Drug-induced polyneuropathy: Secondary | ICD-10-CM | POA: Insufficient documentation

## 2021-07-20 DIAGNOSIS — Z9221 Personal history of antineoplastic chemotherapy: Secondary | ICD-10-CM | POA: Diagnosis not present

## 2021-07-20 MED ORDER — GABAPENTIN 600 MG PO TABS: 600.0000 mg | ORAL_TABLET | Freq: Three times a day (TID) | ORAL | 3 refills | Status: DC

## 2021-07-20 MED ORDER — AMITRIPTYLINE HCL 50 MG PO TABS: 50.0000 mg | ORAL_TABLET | Freq: Every day | ORAL | 2 refills | Status: DC

## 2021-07-20 NOTE — Progress Notes (Signed)
Patient here for oncology follow-up appointment, concerns of chronic neuropathy  

## 2021-07-20 NOTE — Progress Notes (Signed)
[facility] [place], [place] [address] [phone]   Interval Evaluation  Date of Service: 07/20/21 Patient Name: [name] Patient MRN: [id number] Patient DOB: January 10, 1951 Provider: Ventura Sellers, MD  Identifying Statement:  [name] is a 71 y.o. 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[place])   06/08/2019 -  Chemotherapy   The patient had dexamethasone (DECADRON) 4 MG tablet, 1 of 1 cycle, Start date: 06/08/2019, End date: 08/30/2019 palonosetron (ALOXI) injection 0.25 mg, 0.25 mg, Intravenous,  Once, 6 of 7 cycles Administration: 0.25 mg (06/08/2019), 0.25 mg (06/15/2019), 0.25 mg (06/22/2019), 0.25 mg (06/29/2019), 0.25 mg (07/06/2019), 0.25 mg (07/27/2019) CISplatin (PLATINOL) 88 mg in sodium chloride 0.9 % 250 mL chemo infusion, 40 mg/m2 = 88 mg, Intravenous,  Once, 6 of 7 cycles Administration: 88 mg (06/08/2019), 88 mg (06/15/2019), 88 mg (06/22/2019), 88 mg (06/29/2019), 88 mg (07/06/2019), 88 mg (07/27/2019) fosaprepitant (EMEND) 150 mg in sodium chloride 0.9 % 145 mL IVPB, 150 mg, Intravenous,  Once, 6 of 7 cycles Administration: 150 mg (06/08/2019), 150 mg (06/15/2019), 150 mg (06/22/2019), 150 mg (06/29/2019), 150 mg (07/06/2019), 150 mg (07/27/2019)   for chemotherapy treatment.     06/08/2019 Cancer Staging   Staging form: Pharynx - Nasopharynx, AJCC 8th Edition - Clinical stage from 06/08/2019: Stage II (cT2, cN0, cM0) - Signed by Sindy Guadeloupe, MD on 06/10/2019      Interval History: [name] presents today for follow up for neuropathy.  He did not end up dosing the tegretol due to concerns regarding side effects.  He has been dosing gabapentin somewhat sporadically, '600mg'$  2-3x per day.  Continues to experience burning pain in  feet and also the face/mouth as prior.  Visited with podiatry yesterday.  H+P (05/18/21) Patient presents today to review neuropathy symptoms.  He describes pain, burning, stabbing and tingling affecting both feet, the tops more than the bottom.  Symptoms have been present for "a couple of years, since the chemo".  There is not a significant numbness component, his gait is unaffected.  There is additionally a component of burning pain affecting the face, both inside and outside around and inside the mouth.  This has been present since radiation.  Both painful regions lead to significant distress and interfere substantially with quality of life.  Gabapentin '100mg'$  3x per day has not been effective.  Medications: Current Outpatient Medications on File Prior to Visit  Medication Sig Dispense Refill   amLODipine (NORVASC) 5 MG tablet Take 1 tablet (5 mg total) by mouth daily. 90 tablet 1   ammonium lactate (AMLACTIN) 12 % lotion Apply 1 application. topically as needed for dry skin. 400 g 0   antiseptic oral rinse (BIOTENE) LIQD 15 mLs by Mouth Rinse route as needed for dry mouth.     aspirin EC 81 MG tablet Take 81 mg by mouth daily. Swallow whole.     carbamazepine (TEGRETOL XR) 100 MG 12 hr tablet Take 1 tablet (100 mg total) by mouth 2 (two) times daily. (Patient not taking: Reported on 07/19/2021) 60 tablet 3   hydrOXYzine (VISTARIL) 25 MG capsule Take 1 capsule (25 mg total) by mouth every 8 (eight) hours as needed. 30 capsule 0   magic mouthwash w/lidocaine SOLN Take 5 mLs by mouth 4 (four) times daily as  needed for mouth pain. 240 mL 2   Multiple Vitamin (MULTIVITAMIN WITH MINERALS) TABS tablet Take 1 tablet by mouth daily.     NONFORMULARY OR COMPOUNDED ITEM See pharmacy note 120 each 2   nystatin (MYCOSTATIN) 100000 UNIT/ML suspension Take by mouth.     omeprazole (PRILOSEC) 40 MG capsule Take 1 capsule (40 mg total) by mouth daily. 30 capsule 3   rosuvastatin (CRESTOR) 5 MG tablet Take 1 tablet  (5 mg total) by mouth daily. 90 tablet 3   tiZANidine (ZANAFLEX) 4 MG tablet TAKE 1 TABLET BY MOUTH EVERY 6 HOURS AS NEEDED FOR MUSCLE SPASMS 30 tablet 1   Current Facility-Administered Medications on File Prior to Visit  Medication Dose Route Frequency Provider Last Rate Last Admin   heparin lock flush 100 unit/mL  500 Units Intravenous Once Sindy Guadeloupe, MD        Allergies:  Allergies  Allergen Reactions   Opana [Oxymorphone Hcl]     Made him BLACKOUT   Amoxicillin Other (See Comments)   Amoxicillin    Elemental Sulfur     Childhood reaction    Oxymorphone Other (See Comments)   Oxymorphone    Sulfa Antibiotics Other (See Comments)   Sulfa Antibiotics    Past Medical History:  Past Medical History:  Diagnosis Date   Alcohol abuse    Benzodiazepine dependence ([place])    Benzodiazepine withdrawal ([facility])    Chronic pain in right foot    COPD (chronic obstructive pulmonary disease) ([place])    Depression 08/19/2017   Depression    Hepatitis C    Hypertension    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[place])    Seizures ([place]) 2010   Seizures ([place])    Sleep apnea    Syncope    questionable vasovagal   Syncope    Past Surgical History:  Past Surgical History:  Procedure Laterality Date   COLONOSCOPY WITH PROPOFOL N/A 12/16/2016   Procedure: COLONOSCOPY WITH PROPOFOL;  Surgeon: Lollie Sails, MD;  Location[facility] ENDOSCOPY;  Service: Endoscopy;  Laterality: N/A;   COLONOSCOPY WITH PROPOFOL N/A 03/14/2017   Procedure: COLONOSCOPY WITH PROPOFOL;  Surgeon: Lollie Sails, MD;  Location: [facility] ENDOSCOPY;  Service: Endoscopy;  Laterality: N/A;   COLONOSCOPY WITH PROPOFOL     FOOT SURGERY Right 03/12/2002   FOOT SURGERY     HEMORRHOID SURGERY     LITHOTRIPSY     MYRINGOTOMY     MYRINGOTOMY WITH TUBE PLACEMENT Left 04/28/2019   Procedure: MYRINGOTOMY WITH TUBE PLACEMENT;  Surgeon: Carloyn Manner, MD;  Location: [facility];  Service: ENT;  Laterality: Left;   NASOPHARYNGOSCOPY N/A  04/28/2019   Procedure: NASOPHARYNGOSCOPY WITH BIOPSY;  Surgeon: Carloyn Manner, MD;  Location: [facility];  Service: ENT;  Laterality: N/A;   NASOPHARYNGOSCOPY     Port a cath Placement     PORTA CATH INSERTION N/A 05/20/2019   Procedure: PORTA CATH INSERTION;  Surgeon: Algernon Huxley, MD;  Location: [facility] CV LAB;  Service: Cardiovascular;  Laterality: N/A;   PORTA CATH REMOVAL N/A 11/29/2020   Procedure: PORTA CATH REMOVAL;  Surgeon: Algernon Huxley, MD;  Location: [facility] CV LAB;  Service: Cardiovascular;  Laterality: N/A;   TONSILLECTOMY     Social History:  Social History   Socioeconomic History   Marital status: Divorced    Spouse name: Not on file   Number of children: Not on file   Years of education: Not on file   Highest education level:  Not on file  Occupational History   Not on file  Tobacco Use   Smoking status: Former    Packs/day: 0.50    Years: 50.00    Pack years: 25.00    Types: Cigarettes    Quit date: 02/05/2019    Years since quitting: 2.4   Smokeless tobacco: Former  Scientific laboratory technician Use: Never used  Substance and Sexual Activity   Alcohol use: Not Currently   Drug use: Not Currently   Sexual activity: Not Currently  Other Topics Concern   Not on file  Social History Narrative   ** Merged History Encounter **       Social Determinants of Health   Financial Resource Strain: Not on file  Food Insecurity: Not on file  Transportation Needs: Not on file  Physical Activity: Not on file  Stress: Not on file  Social Connections: Not on file  Intimate Partner Violence: Not on file   Family History:  Family History  Problem Relation Age of Onset   Arthritis Mother    Hypertension Mother    Cancer Father    Kidney disease Father    Alcohol abuse Paternal Aunt    Depression Maternal Grandfather     Review of Systems: Constitutional: Doesn't report fevers, chills or abnormal weight loss Eyes: Doesn't report blurriness of  vision Ears, nose, mouth, throat, and face: Doesn't report sore throat Respiratory: Doesn't report cough, dyspnea or wheezes Cardiovascular: Doesn't report palpitation, chest discomfort  Gastrointestinal:  Doesn't report nausea, constipation, diarrhea GU: Doesn't report incontinence Skin: Doesn't report skin rashes Neurological: Per HPI Musculoskeletal: Doesn't report joint pain Behavioral/Psych: Doesn't report anxiety  Physical Exam: Vitals:   07/20/21 0906  BP: 109/73  Pulse: 68  Resp: 17  Temp: (!) 96 F (35.6 C)  SpO2: 99%    KPS: 90. General: Alert, cooperative, pleasant, in no acute distress Head: Normal EENT: No conjunctival injection or scleral icterus.  Lungs: Resp effort normal Cardiac: Regular rate Abdomen: Non-distended abdomen Skin: No rashes cyanosis or petechiae. Extremities: No clubbing or edema  Neurologic Exam: Mental Status: Awake, alert, attentive to examiner. Oriented to self and environment. Language is fluent with intact comprehension.  Cranial Nerves: Visual acuity is grossly normal. Visual fields are full. Extra-ocular movements intact. No ptosis. Face is symmetric Motor: Tone and bulk are normal. Power is full in both arms and legs. Reflexes are symmetric, no pathologic reflexes present.  Sensory: Intact to light touch Gait: Normal.   Labs: I have reviewed the data as listed    Component Value Date/Time   NA 135 05/11/2021 1038   NA 139 02/26/2018 1540   NA 139 12/31/2013 1207   K 3.7 05/11/2021 1038   K 3.8 12/31/2013 1207   CL 95 (L) 05/11/2021 1038   CL 101 12/31/2013 1207   CO2 30 05/11/2021 1038   CO2 28 12/31/2013 1207   GLUCOSE 138 (H) 05/11/2021 1038   GLUCOSE 134 (H) 12/31/2013 1207   BUN 17 05/11/2021 1038   BUN 12 02/26/2018 1540   BUN 8 12/31/2013 1207   CREATININE 1.18 05/11/2021 1038   CREATININE 1.03 12/31/2013 1207   CALCIUM 9.3 05/11/2021 1038   CALCIUM 8.8 12/31/2013 1207   PROT 7.7 05/11/2021 1038   PROT 8.1  02/26/2018 1540   PROT 8.4 (H) 12/31/2013 1207   ALBUMIN 4.5 05/11/2021 1038   ALBUMIN 4.9 (H) 02/26/2018 1540   ALBUMIN 4.3 12/31/2013 1207   AST 25 05/11/2021 1038  AST 48 (H) 12/31/2013 1207   ALT 18 05/11/2021 1038   ALT 33 12/31/2013 1207   ALKPHOS 61 05/11/2021 1038   ALKPHOS 66 12/31/2013 1207   BILITOT 0.9 05/11/2021 1038   BILITOT 0.6 02/26/2018 1540   BILITOT 0.8 12/31/2013 1207   GFRNONAA >60 05/11/2021 1038   GFRNONAA >60 12/31/2013 1207   GFRAA >60 12/11/2019 0531   GFRAA >60 12/31/2013 1207   Lab Results  Component Value Date   WBC 6.7 05/11/2021   NEUTROABS 4.7 05/11/2021   HGB 15.6 05/11/2021   HCT 45.7 05/11/2021   MCV 91.0 05/11/2021   PLT 175 05/11/2021     Assessment/Plan Chemotherapy-induced peripheral neuropathy ([facility])  [name] presents with clinical syndrome consistent with symmetric, length dependent, small and large fiber peripheral neuropathy.  Etiology is exposure to chemotherapy, cisplatin.  Facial component is secondary to radiation and chemotherapy both.  Gabapentin has been modestly for him at dose level '1800mg'$  daily.  Previously he had poor response to Lyrica and Cymbalta.   Last visit we recommended trial of Tegretol given facial pain component, he did not start this due to concerns regarding potential side effects.   Today we discussed utility of TCAs in neuoropathic pain.  He is agreeable to trial of Amytryptilline '50mg'$  HS.  In meantime, will continue Gabapentin '600mg'$  TID, refill provided.  We appreciate the opportunity to participate in the care of JARRON CURLEY West.  We will touch base via phone in 3-4 weeks for med titration.  All questions were answered. The patient knows to call the clinic with any problems, questions or concerns. No barriers to learning were detected.  The total time spent in the encounter was 30 minutes and more than 50% was on counseling and review of test results   Ventura Sellers,  MD Medical Director of Neuro-Oncology [facility] at [facility] 07/20/21 9:05 AM

## 2021-07-24 ENCOUNTER — Ambulatory Visit: Payer: Medicare HMO | Admitting: Nurse Practitioner

## 2021-07-31 ENCOUNTER — Other Ambulatory Visit: Payer: Self-pay

## 2021-07-31 ENCOUNTER — Ambulatory Visit (INDEPENDENT_AMBULATORY_CARE_PROVIDER_SITE_OTHER): Payer: Medicare HMO | Admitting: Nurse Practitioner

## 2021-07-31 ENCOUNTER — Encounter: Payer: Self-pay | Admitting: Nurse Practitioner

## 2021-07-31 VITALS — BP 130/89 | HR 101 | Temp 98.0°F | Resp 16 | Ht 69.0 in | Wt 217.0 lb

## 2021-07-31 DIAGNOSIS — G629 Polyneuropathy, unspecified: Secondary | ICD-10-CM | POA: Diagnosis not present

## 2021-07-31 DIAGNOSIS — M5442 Lumbago with sciatica, left side: Secondary | ICD-10-CM | POA: Diagnosis not present

## 2021-07-31 DIAGNOSIS — Z0001 Encounter for general adult medical examination with abnormal findings: Secondary | ICD-10-CM | POA: Diagnosis not present

## 2021-07-31 DIAGNOSIS — F411 Generalized anxiety disorder: Secondary | ICD-10-CM

## 2021-07-31 DIAGNOSIS — I700 Atherosclerosis of aorta: Secondary | ICD-10-CM

## 2021-07-31 DIAGNOSIS — I10 Essential (primary) hypertension: Secondary | ICD-10-CM | POA: Diagnosis not present

## 2021-07-31 DIAGNOSIS — R69 Illness, unspecified: Secondary | ICD-10-CM | POA: Diagnosis not present

## 2021-07-31 DIAGNOSIS — M5441 Lumbago with sciatica, right side: Secondary | ICD-10-CM

## 2021-07-31 DIAGNOSIS — R300 Dysuria: Secondary | ICD-10-CM | POA: Diagnosis not present

## 2021-07-31 NOTE — Progress Notes (Signed)
[facility] ?[address] ?[place], [place] [address] ? ?Internal MEDICINE  ?Office Visit Note ? ?Patient Name: [name] ? [id number]  ?[id number] ? ?Date of Service: 07/31/2021 ? ?Chief Complaint  ?Patient presents with  ? Medicare Wellness  ? Depression  ? Hypertension  ? COPD  ? Quality Metric Gaps  ?  Tetanus, Pneumonia and Shingles vaccines  ? ? ?HPI ?Maykel presents for an annual well visit and physical exam.  He is a well-appearing 71 year old male with chemo-induced neuropathy, hypertension, depression, and COPD he is overdue for labs.  He had labs ordered in 07/27/2022 and October last year and has not had them drawn yet, patient reminded during office visit today.  He has a history of a nasopharyngeal carcinoma and is status post chemo and radiation therapy which was completed in 07/27/19.  His mother passed away in 2022-05-28 of this year and the patient seems to be coping well he reports that she was 4 when she passed away and she had dementia so she had lived a good life and it was not an unexpected death. ?He is not due for a routine colonoscopy until 27-Jul-2026.  He is seeing a neuro oncologist for the chemo-induced neuropathy and reports that at his previous office visit with that doctor that his gabapentin dose was increased ?He has tried Cymbalta but it made him nauseous so it was discontinued previously. ?He reports that he is scheduled with a pain management clinic and has an upcoming appointment with them soon in June.  He still wants to lose weight but states that the gabapentin makes him gain weight.  Patient does drink alcohol which can cause weight gain as well.  He is a former smoker and quit in 27-Jul-2018. ?Patient thinks he is having a cataract forming in his right eye and will need an ophthalmology referral for further evaluation. ? ? ?Current Medication: ?Outpatient Encounter Medications as of 07/31/2021  ?Medication Sig  ? amitriptyline (ELAVIL) 50 MG tablet Take 1 tablet (50  mg total) by mouth at bedtime.  ? amLODipine (NORVASC) 5 MG tablet Take 1 tablet (5 mg total) by mouth daily.  ? ammonium lactate (AMLACTIN) 12 % lotion Apply 1 application. topically as needed for dry skin.  ? antiseptic oral rinse (BIOTENE) LIQD 15 mLs by Mouth Rinse route as needed for dry mouth.  ? aspirin EC 81 MG tablet Take 81 mg by mouth daily. Swallow whole.  ? carbamazepine (TEGRETOL XR) 100 MG 12 hr tablet Take 1 tablet (100 mg total) by mouth 2 (two) times daily.  ? gabapentin (NEURONTIN) 600 MG tablet Take 1 tablet (600 mg total) by mouth 3 (three) times daily.  ? hydrOXYzine (VISTARIL) 25 MG capsule Take 1 capsule (25 mg total) by mouth every 8 (eight) hours as needed.  ? magic mouthwash w/lidocaine SOLN Take 5 mLs by mouth 4 (four) times daily as needed for mouth pain.  ? Multiple Vitamin (MULTIVITAMIN WITH MINERALS) TABS tablet Take 1 tablet by mouth daily.  ? NONFORMULARY OR COMPOUNDED ITEM See pharmacy note  ? nystatin (MYCOSTATIN) 100000 UNIT/ML suspension Take by mouth.  ? omeprazole (PRILOSEC) 40 MG capsule Take 1 capsule (40 mg total) by mouth daily.  ? rosuvastatin (CRESTOR) 5 MG tablet Take 1 tablet (5 mg total) by mouth daily.  ? tiZANidine (ZANAFLEX) 4 MG tablet TAKE 1 TABLET BY MOUTH EVERY 6 HOURS AS NEEDED FOR MUSCLE SPASMS  ? ?Facility-Administered Encounter Medications as of 07/31/2021  ?Medication  ?  heparin lock flush 100 unit/mL  ? ? ?Surgical History: ?Past Surgical History:  ?Procedure Laterality Date  ? COLONOSCOPY WITH PROPOFOL N/A 12/16/2016  ? Procedure: COLONOSCOPY WITH PROPOFOL;  Surgeon: Lollie Sails, MD;  Location: [facility] ENDOSCOPY;  Service: Endoscopy;  Laterality: N/A;  ? COLONOSCOPY WITH PROPOFOL N/A 03/14/2017  ? Procedure: COLONOSCOPY WITH PROPOFOL;  Surgeon: Lollie Sails, MD;  [facility]COPY;  Service: Endoscopy;  Laterality: N/A;  ? COLONOSCOPY WITH PROPOFOL    ? FOOT SURGERY Right 03/12/2002  ? FOOT SURGERY    ? HEMORRHOID SURGERY    ? LITHOTRIPSY     ? MYRINGOTOMY    ? MYRINGOTOMY WITH TUBE PLACEMENT Left 04/28/2019  ? Procedure: MYRINGOTOMY WITH TUBE PLACEMENT;  Surgeon: Carloyn Manner, MD;  Location: [facility];  Service: ENT;  Laterality: Left;  ? NASOPHARYNGOSCOPY N/A 04/28/2019  ? Procedure: NASOPHARYNGOSCOPY WITH BIOPSY;  Surgeon: Carloyn Manner, MD;  Location: [facility];  Service: ENT;  Laterality: N/A;  ? NASOPHARYNGOSCOPY    ? Port a cath Placement    ? PORTA CATH INSERTION N/A 05/20/2019  ? Procedure: PORTA CATH INSERTION;  Surgeon: Algernon Huxley, MD;  Location: [facility] CV LAB;  Service: Cardiovascular;  Laterality: N/A;  ? PORTA CATH REMOVAL N/A 11/29/2020  ? Procedure: PORTA CATH REMOVAL;  Surgeon: Algernon Huxley, MD;  Location: [facility] CV LAB;  Service: Cardiovascular;  Laterality: N/A;  ? TONSILLECTOMY    ? ? ?Medical History: ?Past Medical History:  ?Diagnosis Date  ? Alcohol abuse   ? Benzodiazepine dependence ([place])   ? Benzodiazepine withdrawal ([place])   ? Chronic pain in right foot   ? COPD (chronic obstructive pulmonary disease) ([place])   ? Depression 08/19/2017  ? Depression   ? Hepatitis C   ? Hypertension   ? 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 Seizures ([place]) 2010  ? Seizures ([place])   ? Sleep apnea   ? Syncope   ? questionable vasovagal  ? Syncope   ? ? ?Family History: ?Family History  ?Problem Relation Age of Onset  ? Arthritis Mother   ? Hypertension Mother   ? Cancer Father   ? Kidney disease Father   ? Alcohol abuse Paternal Aunt   ? Depression Maternal Grandfather   ? ? ?Social History  ? ?Socioeconomic History  ? Marital status: Divorced  ?  Spouse name: Not on file  ? Number of children: Not on file  ? Years of education: Not on file  ? Highest education level: Not on file  ?Occupational History  ? Not on file  ?Tobacco Use  ? Smoking status: Former  ?  Packs/day: 0.50  ?  Years: 50.00  ?  Pack years: 25.00  ?  Types: Cigarettes  ?  Quit date: 02/05/2019  ?  Years since quitting: 2.4  ? Smokeless tobacco: Former   ?Vaping Use  ? Vaping Use: Never used  ?Substance and Sexual Activity  ? Alcohol use: Not Currently  ? Drug use: Not Currently  ? Sexual activity: Not Currently  ?Other Topics Concern  ? Not on file  ?Social History Narrative  ? ** Merged History Encounter **  ?    ? ?Social Determinants of Health  ? ?Financial Resource Strain: Not on file  ?Food Insecurity: Not on file  ?Transportation Needs: Not on file  ?Physical Activity: Not on file  ?Stress: Not on file  ?Social Connections: Not on file  ?Intimate Partner Violence: Not on file  ? ? ? ? ?  Review of Systems  ?Constitutional:  Negative for chills, fatigue and unexpected weight change.  ?HENT:  Negative for congestion, rhinorrhea, sneezing and sore throat.   ?Eyes:  Negative for redness.  ?Respiratory:  Negative for cough, chest tightness and shortness of breath.   ?Cardiovascular: Negative.  Negative for chest pain and palpitations.  ?Gastrointestinal: Negative.  Negative for abdominal pain, constipation, diarrhea, nausea and vomiting.  ?Genitourinary:  Negative for dysuria and frequency.  ?Musculoskeletal:  Positive for arthralgias and back pain. Negative for joint swelling and neck pain.  ?Skin:  Negative for rash.  ?Neurological: Negative.  Negative for tremors and numbness.  ?Hematological: Negative.  Negative for adenopathy. Does not bruise/bleed easily.  ?Psychiatric/Behavioral:  Positive for sleep disturbance. Negative for behavioral problems (Depression), self-injury and suicidal ideas. The patient is nervous/anxious.   ? ?Vital Signs: ?BP 130/89   Pulse (!) 101   Temp 98 ?F (36.7 ?C)   Resp 16   Ht '5\' 9"'$  (1.753 m)   Wt 217 lb (98.4 kg)   SpO2 97%   BMI 32.05 kg/m?  ? ? ?Physical Exam ?Vitals reviewed.  ?Constitutional:   ?   General: He is not in acute distress. ?   Appearance: Normal appearance. He is well-developed. He is obese. He is not ill-appearing or diaphoretic.  ?HENT:  ?   Head: Normocephalic and atraumatic.  ?   Right Ear: Tympanic  membrane, ear canal and external ear normal.  ?   Left Ear: Tympanic membrane, ear canal and external ear normal.  ?   Nose: Nose normal. No congestion or rhinorrhea.  ?   Mouth/Throat:  ?   Mouth: Mucous Gardiner Fanti

## 2021-08-01 LAB — UA/M W/RFLX CULTURE, ROUTINE
Bilirubin, UA: NEGATIVE
Glucose, UA: NEGATIVE
Ketones, UA: NEGATIVE
Leukocytes,UA: NEGATIVE
Nitrite, UA: NEGATIVE
Protein,UA: NEGATIVE
RBC, UA: NEGATIVE
Specific Gravity, UA: 1.017 (ref 1.005–1.030)
Urobilinogen, Ur: 1 mg/dL (ref 0.2–1.0)
pH, UA: 6.5 (ref 5.0–7.5)

## 2021-08-01 LAB — MICROSCOPIC EXAMINATION
Bacteria, UA: NONE SEEN
Casts: NONE SEEN /lpf
Epithelial Cells (non renal): NONE SEEN /hpf (ref 0–10)
RBC, Urine: NONE SEEN /hpf (ref 0–2)
WBC, UA: NONE SEEN /hpf (ref 0–5)

## 2021-08-02 ENCOUNTER — Ambulatory Visit
Payer: Medicare HMO | Attending: Student in an Organized Health Care Education/Training Program | Admitting: Student in an Organized Health Care Education/Training Program

## 2021-08-02 ENCOUNTER — Encounter: Payer: Self-pay | Admitting: Student in an Organized Health Care Education/Training Program

## 2021-08-02 ENCOUNTER — Other Ambulatory Visit: Payer: Self-pay

## 2021-08-02 VITALS — BP 186/87 | HR 94 | Temp 98.1°F | Resp 16 | Ht 69.0 in | Wt 217.0 lb

## 2021-08-02 DIAGNOSIS — R69 Illness, unspecified: Secondary | ICD-10-CM | POA: Diagnosis not present

## 2021-08-02 DIAGNOSIS — C119 Malignant neoplasm of nasopharynx, unspecified: Secondary | ICD-10-CM | POA: Diagnosis not present

## 2021-08-02 DIAGNOSIS — F321 Major depressive disorder, single episode, moderate: Secondary | ICD-10-CM | POA: Diagnosis not present

## 2021-08-02 DIAGNOSIS — G620 Drug-induced polyneuropathy: Secondary | ICD-10-CM | POA: Insufficient documentation

## 2021-08-02 DIAGNOSIS — T451X5A Adverse effect of antineoplastic and immunosuppressive drugs, initial encounter: Secondary | ICD-10-CM

## 2021-08-02 DIAGNOSIS — D701 Agranulocytosis secondary to cancer chemotherapy: Secondary | ICD-10-CM | POA: Insufficient documentation

## 2021-08-02 DIAGNOSIS — G894 Chronic pain syndrome: Secondary | ICD-10-CM | POA: Diagnosis not present

## 2021-08-02 NOTE — Progress Notes (Signed)
Patient: [name]  Service Category: E/M  Provider: Gillis Santa, MD  ?DOB: May 13, 1951  DOS: 08/02/2021  Referring Provider: Mylinda Latina, PA*  ?MRN: [id number]  Setting: Ambulatory outpatient  PCP: Mylinda Latina, PA-C  ?Type: New Patient  Specialty: Interventional Pain Management    ?Location: Office  Delivery: Face-to-face    ? ?Primary Reason(s) for Visit: Encounter for initial evaluation of one or more chronic problems (new to examiner) potentially causing chronic pain, and posing a threat to normal musculoskeletal function. (Level of risk: High) ?CC: Foot Pain (Bilateral neuropathy.  Right foot s/p surgical repair from fx and now has hardware in that foot. ) and Facial Pain (Face pain brought on by chemo therapy.  Around nose and mouth. ) ? ?HPI  ?[name] is a 71 y.o. year old, male patient, who comes for the first time to our practice referred by Mylinda Latina, PA* for our initial evaluation of his chronic pain. He has Seizures ([place]); Benzodiazepine withdrawal ([place]); Seizure disorder ([place]); Alcohol abuse; Essential hypertension; Hepatitis C; Abnormal chest x-ray; Obesity; Tobacco use disorder; Chemotherapy-induced peripheral neuropathy ([place]); Complex regional pain syndrome of both lower extremities; Neuralgia; Encounter for general adult medical examination with abnormal findings; Primary insomnia; Inflammatory polyarthritis ([place]); Screening for colon cancer; Dysuria; Depression; History of hepatitis C; Syncope; Gastroesophageal reflux disease without esophagitis; Thrush, oral; Mixed hyperlipidemia; Low back pain due to bilateral sciatica; Restless leg syndrome; Vitamin B12 deficiency; Stomatitis and mucositis; Atopic dermatitis; Cellulitis and abscess of head; Non-recurrent acute serous otitis media of left ear; 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 Today he comes in for evaluation of his Foot Pain (Bilateral neuropathy.  Right foot s/p surgical repair from fx and now has hardware in that foot. ) and Facial Pain (Face pain brought on by chemo therapy.  Around nose and mouth. ) ? ?Pain Assessment: ?Location: Left, Right Foot (face) ?Radiating: denies ?Onset: More than a month ago ?Duration: Chronic pain ?Quality: Discomfort, Constant, Numbness, Tingling, Burning ?Severity: 4 /10 (subjective, self-reported pain score)  ?Effect on ADL: facial pain causes him to be unable to ennuciate words properly. ?Timing: Constant ?Modifying factors: has tried everything and nothing seems to work ?BP: (!) 186/87  HR: 94 ? ?Onset and Duration: Date of onset: 19 years, 5 months ?Cause of pain: Work related accident or event ?Severity: No change since onset, NAS-11 at its worse: 7/10, NAS-11 at its best: 2/10, NAS-11 now: 3/10, and NAS-11 on the average: 5/10 ?Timing: Not influenced by the time of the day ?Aggravating Factors: Prolonged standing, Surgery made it worse, and Walking ?Alleviating Factors: Resting ?Associated Problems: Numbness, Tingling, and Pain that wakes patient up ?Quality of Pain: Agonizing, Annoying, Cramping, Hot, Stabbing, Throbbing, Toothache-like, and Uncomfortable ?Previous Examinations or Tests: Biopsy, CT scan, MRI scan, X-rays, Neurological evaluation, and Orthopedic evaluation ?Previous Treatments: Narcotic medications ? ?[name] is a pleasant 71 year old male who presents with chronic pain related to chemotherapy induced neuropathy.  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  He describes burning and tingling along his  face and in his extremities.  He has tried trigger trial in the past with limited response.  He has been seen by neurology and has  been managed on various OT convulsants and neuropathic's including gabapentin which she is currently taking at 600 mg 3 times daily to 4 times daily.  He has also tried TCA with amitriptyline and is currently on 50 mg amitriptyline nightly.  He has also tried tizanidine in the past.  He is not a diabetic.  He has done physical therapy in the past.  When I asked him about his positive urine toxicology screen in 2021 for cocaine, he states that that was a lab error. ? ?I informed the patient that his only medication option here would be buprenorphine for chronic pain management. ? ?Today I took the time to provide the patient with information regarding my pain practice. The patient was informed that my practice is divided into two sections: an interventional pain management section, as well as a completely separate and distinct medication management section. I explained that I have procedure days for my interventional therapies, and evaluation days for follow-ups and medication management. Because of the amount of documentation required during both, they are kept separated. This means that there is the possibility that he may be scheduled for a procedure on one day, and medication management the next. I have also informed him that because of staffing and facility limitations, I no longer take patients for medication management only. To illustrate the reasons for this, I gave the patient the example of surgeons, and how inappropriate it would be to refer a patient to his/her care, just to write for the post-surgical antibiotics on a surgery done by a different surgeon.  ? ?Because interventional pain management is my board-certified specialty, the patient was informed that joining my practice means that they are open to any and all interventional therapies. I made it clear that this does  not mean that they will be forced to have any procedures done. What this means is that I believe interventional therapies to be essential part of the diagnosis and proper management of chronic pain conditions. Therefore, patients not interested in these interventional alternatives will be better served under the care of a different practitioner. ? ?The patient was also made aware of my Comprehensive Pain Management Safety Guidelines where by joining my practice, they limit all of their nerve blocks and joint injections to those done by our practice, for as long as we are retained to manage their care.  ? ?Historic Controlled Substance Pharmacotherapy Review  ? ?Historical Monitoring: ?The patient  reports that he does not currently use drugs. ?List of all UDS Test(s): ?Lab Results  ?Component Value Date  ? MDMA NONE DETECTED 12/10/2019  ? MDMA NONE DETECTED 11/03/2019  ? MDMA NONE DETECTED 10/04/2014  ? MDMA POSITIVE 12/31/2013  ? MDMA NEGATIVE 06/07/2013  ? MDMA NEGATIVE 04/13/2013  ? MDMA NEGATIVE 10/16/2011  ? COCAINSCRNUR POSITIVE (A) 12/10/2019  ? COCAINSCRNUR NONE DETECTED 11/03/2019  ? COCAINSCRNUR NONE DETECTED 10/04/2014  ? COCAINSCRNUR NEGATIVE 12/31/2013  ? COCAINSCRNUR NONE DETECTED 11/29/2013  ? COCAINSCRNUR NEGATIVE 06/07/2013  ? COCAINSCRNUR NEGATIVE 04/13/2013  ? COCAINSCRNUR NEGATIVE 10/16/2011  ? PCPSCRNUR NONE DETECTED 12/10/2019  ? PCPSCRNUR NONE DETECTED 11/03/2019  ? PCPSCRNUR NONE DETECTED 10/04/2014  ? PCPSCRNUR NEGATIVE 12/31/2013  ? PCPSCRNUR NEGATIVE 06/07/2013  ? PCPSCRNUR NEGATIVE 04/13/2013  ? PCPSCRNUR NEGATIVE 10/16/2011  ? THCU NONE DETECTED 12/10/2019  ? THCU NONE DETECTED 11/03/2019  ? THCU NONE DETECTED 10/04/2014  ? THCU NEGATIVE 12/31/2013  ? THCU NONE DETECTED 11/29/2013  ? THCU NEGATIVE 06/07/2013  ? THCU NEGATIVE 04/13/2013  ? THCU  NEGATIVE 10/16/2011  ? ETH <10 12/10/2019  ? ETH <10 11/03/2019  ? ETH <11 11/29/2013  ? ?List of other Serum/Urine Drug Screening Test(s):  ?Lab  Results  ?Component Value Date  ? COCAINSCRNUR POSITIVE (A) 12/10/2019  ? COCAINSCRNUR NONE DETECTED 11/03/2019  ? COCAINSCRNUR NONE DETECTED 10/04/2014  ? COCAINSCRNUR NEGATIVE 12/31/2013  ? COCAINSCRNU

## 2021-08-02 NOTE — Progress Notes (Signed)
Safety precautions to be maintained throughout the outpatient stay will include: orient to surroundings, keep bed in low position, maintain call bell within reach at all times, provide assistance with transfer out of bed and ambulation.  ? ?Neurologist increased Gabapentin dose 2 weeks ago.   ?2.   Patient is a cancer survivor and suffers with neuropathy d/t chemo.  ?

## 2021-08-02 NOTE — Patient Instructions (Signed)
Buprenorphine

## 2021-08-09 LAB — COMPLIANCE DRUG ANALYSIS, UR

## 2021-08-16 ENCOUNTER — Encounter: Payer: Self-pay | Admitting: Student in an Organized Health Care Education/Training Program

## 2021-08-16 ENCOUNTER — Ambulatory Visit
Payer: Medicare HMO | Attending: Student in an Organized Health Care Education/Training Program | Admitting: Student in an Organized Health Care Education/Training Program

## 2021-08-16 VITALS — BP 150/78 | HR 86 | Temp 98.2°F | Resp 16 | Ht 69.5 in | Wt 217.0 lb

## 2021-08-16 DIAGNOSIS — G620 Drug-induced polyneuropathy: Secondary | ICD-10-CM | POA: Diagnosis not present

## 2021-08-16 DIAGNOSIS — G894 Chronic pain syndrome: Secondary | ICD-10-CM | POA: Insufficient documentation

## 2021-08-16 DIAGNOSIS — C119 Malignant neoplasm of nasopharynx, unspecified: Secondary | ICD-10-CM | POA: Diagnosis not present

## 2021-08-16 DIAGNOSIS — F321 Major depressive disorder, single episode, moderate: Secondary | ICD-10-CM | POA: Insufficient documentation

## 2021-08-16 DIAGNOSIS — D701 Agranulocytosis secondary to cancer chemotherapy: Secondary | ICD-10-CM | POA: Insufficient documentation

## 2021-08-16 DIAGNOSIS — R69 Illness, unspecified: Secondary | ICD-10-CM | POA: Diagnosis not present

## 2021-08-16 DIAGNOSIS — T451X5A Adverse effect of antineoplastic and immunosuppressive drugs, initial encounter: Secondary | ICD-10-CM | POA: Diagnosis not present

## 2021-08-16 NOTE — Progress Notes (Signed)
Safety precautions to be maintained throughout the outpatient stay will include: orient to surroundings, keep bed in low position, maintain call bell within reach at all times, provide assistance with transfer out of bed and ambulation.  

## 2021-08-16 NOTE — Progress Notes (Signed)
PROVIDER NOTE: Information contained herein reflects review and annotations entered in association with encounter. Interpretation of such information and data should be left to medically-trained personnel. Information provided to patient can be located elsewhere in the medical record under "Patient Instructions". Document created using STT-dictation technology, any transcriptional errors that may result from process are unintentional.  ?  ?Patient: [name]  Service Category: E/M  Provider: Gillis Santa, MD  ?DOB: 07/28/1950  DOS: 08/16/2021  Specialty: Interventional Pain Management  ?MRN: [id number]  Setting: Ambulatory outpatient  PCP: Anthony Latina, PA-C  ?Type: Established Patient    Referring Provider: Mylinda Latina, PA*  ?Location: Office  Delivery: Face-to-face    ? ?HPI  ?[name], a 71 y.o. year old male, is here today because of his Chemotherapy-induced peripheral neuropathy ([place]) [G62.0, T45.1X5A]. [name] primary complain today is No chief complaint on file. ?Last encounter: My last encounter with him was on 08/02/2021. 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 ?Pain Assessment: Severity of Chronic pain is reported as a 6 /10. Location: Foot (face pain) Right, Left/feet radiate up into the legs. Onset: More than a month ago. Quality: Discomfort, Constant, Stabbing. Timing: Constant. Modifying factor(s): nothing currently. ?Vitals:  height is 5' 9.5" (1.765 m) and weight is 217 lb (98.4 kg). His temporal temperature is 98.2 ?F (36.8 ?C). His blood pressure is 150/78 (abnormal) and his pulse is 86. His respiration is 16 and oxygen saturation is 97%.  ? ?Reason for encounter:  ? ?[name] follows up today for his second patient visit.  I reviewed his urine toxicology screen with him today which was  positive for THC.  I informed the patient that we will need a clean urine toxicology screen before we are able to consider any controlled substances such as tramadol or buprenorphine.  Patient endorsed understanding.  He states that he has been utilizing [place] which he states he will stop.  I informed him that I cannot prescribe him a controlled 2 or controlled 3 with him having an illicit substance in his urine.  Patient endorsed understanding.  We will repeat his urine toxicology screen today and he is instructed to follow-up in 10 days to review. ? ?HPI from initial clinic visit: 08/02/2021 ?[name] is a pleasant 71 year old male who presents with chronic pain related to chemotherapy induced neuropathy.  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  He describes burning and tingling along his face and in his extremities.  He has tried trigger trial in the past with limited response.  He has been seen by neurology and has been managed on various OT convulsants and neuropathic's including gabapentin which she is currently taking at 600 mg 3 times daily to 4 times daily.  He has also tried TCA with amitriptyline and is currently on 50 mg amitriptyline nightly.  He has also tried tizanidine in the past.  He is not a diabetic.  He has done physical therapy in the past.  When I asked him about his positive urine toxicology screen in 2021 for cocaine, he states that that was a lab error. ?  ?I informed the patient that his only medication option here would be buprenorphine for chronic pain management. ? ?Pharmacotherapy Assessment  ? ? ?Monitoring: ?[place] PMP: PDMP reviewed during this encounter.       ? ? ?  Anthony Billow, RN  08/16/2021  1:56 PM  Sign when Signing Visit ?Safety precautions to be maintained throughout the outpatient stay will include: orient to surroundings, keep bed in low position, maintain call bell within reach at all times, provide assistance with  transfer out of bed and ambulation.  ?   UDS:  ?Summary  ?Date Value Ref Range Status  ?08/02/2021 Note  Final  ?  Comment:  ?  ==================================================================== ?Compliance Drug Analysis, Ur ?==================================================================== ?Test                             Result       Flag       Units ? ?Drug Present and Declared for Prescription Verification ?  Gabapentin                     PRESENT      EXPECTED ?  Amitriptyline                  PRESENT      EXPECTED ?  Nortriptyline                  PRESENT      EXPECTED ?   Nortriptyline is an expected metabolite of amitriptyline. ? ?  Hydroxyzine                    PRESENT      EXPECTED ? ?Drug Present not Declared for Prescription Verification ?  Carboxy-THC                    >847         UNEXPECTED ng/mg creat ?   Carboxy-THC is a metabolite of tetrahydrocannabinol (THC). Source of ?   THC is most commonly herbal marijuana or marijuana-based products, ?   but THC is also present in a scheduled prescription medication. ?   Trace amounts of THC can be present in hemp and cannabidiol (CBD) ?   products. This test is not intended to distinguish between delta-9- ?   tetrahydrocannabinol, the predominant form of THC in most herbal or ?   marijuana-based products, and delta-8-tetrahydrocannabinol. ? ?  Ibuprofen                      PRESENT      UNEXPECTED ?  Lidocaine                      PRESENT      UNEXPECTED ? ?Drug Absent but Declared for Prescription Verification ?  Carbamazepine                  Not Detected UNEXPECTED ?  Tizanidine                     Not Detected UNEXPECTED ?   Tizanidine, as indicated in the declared medication list, is not ?   always detected even when used as directed. ? ?  Salicylate                     Not Detected UNEXPECTED ?   Aspirin, as indicated in the declared medication list, is not always ?   detected even when used as  directed. ? ?==================================================================== ?Test  Result    Flag   Units      Ref Range ?  Creatinine              118              mg/dL      >=20 ?==================================================================== ?Declared Medications: ? The flagging and interpretation on this report are based on the ? following declared medications.  Unexpected results may arise from ? inaccuracies in the declared medications. ? ? **Note: The testing scope of this panel includes these medications: ? ? Amitriptyline (Elavil) ? Carbamazepine (Tegretol) ? Gabapentin (Neurontin) ? Hydroxyzine (Vistaril) ? ? **Note: The testing scope of this panel does not include small to ? moderate amounts of these reported medications: ? ? Aspirin ? Tizanidine (Zanaflex) ? ? **Note: The testing scope of this panel does not include the ? following reported medications: ? ? Amlodipine (Norvasc) ? Ammonium Hydroxide (Ammonium Lactate) ? Fluoride (Biotene) ? Lactic Acid (Ammonium Lactate) ? Mouthwash ? Multivitamin ? Nystatin (Mycostatin) ? Omeprazole (Prilosec) ? Rosuvastatin (Crestor) ?==================================================================== ?For clinical consultation, please call [phone]. ?==================================================================== ?  ?  ? ?ROS  ?Constitutional: Denies any fever or chills ?Gastrointestinal: No reported hemesis, hematochezia, vomiting, or acute GI distress ?Musculoskeletal: Denies any acute onset joint swelling, redness, loss of ROM, or weakness ?Neurological: No reported episodes of acute onset apraxia, aphasia, dysarthria, agnosia, amnesia, paralysis, loss of coordination, or loss of consciousness ? ?Medication Review  ?NONFORMULARY OR COMPOUNDED ITEM, amLODipine, amitriptyline, ammonium lactate, antiseptic oral rinse, aspirin EC, carbamazepine, gabapentin, hydrOXYzine, magic mouthwash w/lidocaine, multivitamin with minerals,  nystatin, omeprazole, rosuvastatin, and tiZANidine ? ?History Review  ?Allergy: [name] is allergic to opana [oxymorphone hcl], amoxicillin, amoxicillin, elemental sulfur, oxymorphone, oxymorphone, sulfa antibiotics, and sulfa antibiotics. ?Drug: [name]

## 2021-08-17 ENCOUNTER — Other Ambulatory Visit: Payer: Self-pay | Admitting: Physician Assistant

## 2021-08-17 ENCOUNTER — Encounter: Payer: Self-pay | Admitting: Oncology

## 2021-08-17 ENCOUNTER — Inpatient Hospital Stay: Payer: Medicare HMO | Attending: Internal Medicine | Admitting: Internal Medicine

## 2021-08-17 DIAGNOSIS — G620 Drug-induced polyneuropathy: Secondary | ICD-10-CM | POA: Diagnosis not present

## 2021-08-17 DIAGNOSIS — F411 Generalized anxiety disorder: Secondary | ICD-10-CM

## 2021-08-17 DIAGNOSIS — T451X5A Adverse effect of antineoplastic and immunosuppressive drugs, initial encounter: Secondary | ICD-10-CM | POA: Diagnosis not present

## 2021-08-17 NOTE — Progress Notes (Signed)
I connected with [name] on 08/17/21 at 11:30 AM EDT by telephone visit and verified that I am speaking with the correct person using two identifiers.  ?I discussed the limitations, risks, security and privacy concerns of performing an evaluation and management service by telemedicine and the availability of in-person appointments. I also discussed with the patient that there may be a patient responsible charge related to this service. The patient expressed understanding and agreed to proceed.  ?Other persons participating in the visit and their role in the encounter:  n/a  ?Patient's location:  Home  ?Provider's location:  Office  ?Chief Complaint:  Chemotherapy-induced peripheral neuropathy (HCC) ? ?History of Present Ilness: [name] describes only very modest improvement, if any, with the Elavil.  His sleep is somewhat better, however.  He met yesterday with pain management who recommended trial of buprenorphine.  No other significant changes noted. ?Observations: Language and cognition at baseline ?Assessment and Plan: Chemotherapy-induced peripheral neuropathy (HCC) ? ?Follow Up Instructions: ? ?He would prefer to continue his current regimen, Gabapentin 600mg TID or QID and Elavil 50mg HS, and add on buprenorphine as discussed.  We are agreeable with this plan. ? ?He may follow up as needed. ? ?I discussed the assessment and treatment plan with the patient.  The patient was provided an opportunity to ask questions and all were answered.  The patient agreed with the plan and demonstrated understanding of the instructions.   ? ?The patient was advised to call back or seek an in-person evaluation if the symptoms worsen or if the condition fails to improve as anticipated.  I provided 5-10 minutes of non-face-to-face time during this enocunter. ? ? K , MD ? ? ?I provided 15 minutes of non face-to-face telephone visit time during this encounter, and > 50% was spent  counseling as documented under my assessment & plan.  ? ?

## 2021-08-22 LAB — COMPLIANCE DRUG ANALYSIS, UR

## 2021-08-22 NOTE — Telephone Encounter (Signed)
Error

## 2021-08-27 ENCOUNTER — Telehealth: Payer: Self-pay

## 2021-08-27 ENCOUNTER — Encounter: Payer: Self-pay | Admitting: Nurse Practitioner

## 2021-08-27 ENCOUNTER — Telehealth (INDEPENDENT_AMBULATORY_CARE_PROVIDER_SITE_OTHER): Payer: Medicare HMO | Admitting: Nurse Practitioner

## 2021-08-27 ENCOUNTER — Encounter: Payer: Medicare HMO | Admitting: Student in an Organized Health Care Education/Training Program

## 2021-08-27 VITALS — BP 150/78 | HR 86 | Temp 98.6°F | Resp 16 | Ht 69.5 in | Wt 207.0 lb

## 2021-08-27 DIAGNOSIS — J208 Acute bronchitis due to other specified organisms: Secondary | ICD-10-CM | POA: Diagnosis not present

## 2021-08-27 DIAGNOSIS — B9689 Other specified bacterial agents as the cause of diseases classified elsewhere: Secondary | ICD-10-CM | POA: Diagnosis not present

## 2021-08-27 DIAGNOSIS — R051 Acute cough: Secondary | ICD-10-CM

## 2021-08-27 MED ORDER — AZITHROMYCIN 250 MG PO TABS: ORAL_TABLET | ORAL | 0 refills | Status: DC

## 2021-08-27 MED ORDER — BENZONATATE 100 MG PO CAPS: 100.0000 mg | ORAL_CAPSULE | Freq: Two times a day (BID) | ORAL | 0 refills | Status: DC | PRN

## 2021-08-27 MED ORDER — HYDROCOD POLI-CHLORPHE POLI ER 10-8 MG/5ML PO SUER: 5.0000 mL | Freq: Two times a day (BID) | ORAL | 0 refills | Status: DC | PRN

## 2021-08-27 NOTE — Progress Notes (Signed)
[facility] ?[address] ?[place], [place] [address] ? ?Internal MEDICINE  ?Telephone Visit ? ?Patient Name: [name] ? [id number]  ?[id number] ? ?Date of Service: 08/27/2021 ? ?I connected with the patient at 3:15 PM by telephone and verified the patients identity using two identifiers.   ?I discussed the limitations, risks, security and privacy concerns of performing an evaluation and management service by telephone and the availability of in person appointments. I also discussed with the patient that there may be a patient responsible charge related to the service.  The patient expressed understanding and agrees to proceed.   ? ?Chief Complaint  ?Patient presents with  ? Acute Visit  ?  Chest congestion for 6 days   ? Telephone Assessment  ?  Phone call  ? Telephone Screen  ?  [phone]  ? ? ?HPI ?[name] presents for a telehealth virtual visit for chest congestion x6 days. He reports a cough with chest congestion. He denies any fever, chills, body aches, or fatigue. He denies any nasal congestion, runny nose, sinus pressure, headache, or sore throat.  ? ? ?Current Medication: ?Outpatient Encounter Medications as of 08/27/2021  ?Medication Sig  ? amitriptyline (ELAVIL) 50 MG tablet Take 1 tablet (50 mg total) by mouth at bedtime.  ? amLODipine (NORVASC) 5 MG tablet Take 1 tablet (5 mg total) by mouth daily.  ? ammonium lactate (AMLACTIN) 12 % lotion Apply 1 application. topically as needed for dry skin.  ? antiseptic oral rinse (BIOTENE) LIQD 15 mLs by Mouth Rinse route as needed for dry mouth.  ? aspirin EC 81 MG tablet Take 81 mg by mouth daily. Swallow whole.  ? azithromycin (ZITHROMAX) 250 MG tablet Take one tab a day for 10 days for uri  ? benzonatate (TESSALON) 100 MG capsule Take 1 capsule (100 mg total) by mouth 2 (two) times daily as needed.  ? chlorpheniramine-HYDROcodone (TUSSIONEX PENNKINETIC ER) 10-8 MG/5ML Take 5 mLs by mouth every 12 (twelve) hours as needed for cough.   ? gabapentin (NEURONTIN) 600 MG tablet Take 1 tablet (600 mg total) by mouth 3 (three) times daily. (Patient taking differently: Take 600 mg by mouth in the morning, at noon, in the evening, and at bedtime.)  ? hydrOXYzine (VISTARIL) 25 MG capsule TAKE 1 CAPSULE BY MOUTH EVERY 8 HOURS ASNEEDED  ? magic mouthwash w/lidocaine SOLN Take 5 mLs by mouth 4 (four) times daily as needed for mouth pain.  ? Multiple Vitamin (MULTIVITAMIN WITH MINERALS) TABS tablet Take 1 tablet by mouth daily.  ? omeprazole (PRILOSEC) 40 MG capsule Take 1 capsule (40 mg total) by mouth daily.  ? rosuvastatin (CRESTOR) 5 MG tablet Take 1 tablet (5 mg total) by mouth daily.  ? tiZANidine (ZANAFLEX) 4 MG tablet TAKE 1 TABLET BY MOUTH EVERY 6 HOURS AS NEEDED FOR MUSCLE SPASMS  ? [DISCONTINUED] carbamazepine (TEGRETOL XR) 100 MG 12 hr tablet Take 1 tablet (100 mg total) by mouth 2 (two) times daily.  ? [DISCONTINUED] NONFORMULARY OR COMPOUNDED ITEM See pharmacy note  ? [DISCONTINUED] nystatin (MYCOSTATIN) 100000 UNIT/ML suspension Take by mouth. (Patient not taking: Reported on 08/16/2021)  ? ?Facility-Administered Encounter Medications as of 08/27/2021  ?Medication  ? heparin lock flush 100 unit/mL  ? ? ?Surgical History: ?Past Surgical History:  ?Procedure Laterality Date  ? COLONOSCOPY WITH PROPOFOL N/A 12/16/2016  ? Procedure: COLONOSCOPY WITH PROPOFOL;  Surgeon: Lollie Sails, MD;  Location: [facility] ENDOSCOPY;  Service: Endoscopy;  Laterality: N/A;  ? COLONOSCOPY WITH PROPOFOL  N/A 03/14/2017  ? Procedure: COLONOSCOPY WITH PROPOFOL;  Surgeon: Lollie Sails, MD;  Location: [facility] ENDOSCOPY;  Service: Endoscopy;  Laterality: N/A;  ? COLONOSCOPY WITH PROPOFOL    ? FOOT SURGERY Right 03/12/2002  ? FOOT SURGERY    ? HEMORRHOID SURGERY    ? LITHOTRIPSY    ? MYRINGOTOMY    ? MYRINGOTOMY WITH TUBE PLACEMENT Left 04/28/2019  ? Procedure: MYRINGOTOMY WITH TUBE PLACEMENT;  Surgeon: Carloyn Manner, MD;  Location: [facility];  Service: ENT;   Laterality: Left;  ? NASOPHARYNGOSCOPY N/A 04/28/2019  ? Procedure: NASOPHARYNGOSCOPY WITH BIOPSY;  Surgeon: Carloyn Manner, MD;  Location: [facility];  Service: ENT;  Laterality: N/A;  ? NASOPHARYNGOSCOPY    ? Port a cath Placement    ? PORTA CATH INSERTION N/A 05/20/2019  ? Procedure: PORTA CATH INSERTION;  Surgeon: Algernon Huxley, MD;  Location: [facility];  Service: Cardiovascular;  Laterality: N/A;  ? PORTA CATH REMOVAL N/A 11/29/2020  ? Procedure: PORTA CATH REMOVAL;  Surgeon: Algernon Huxley, MD;  Location: [facility];  Service: Cardiovascular;  Laterality: N/A;  ? TONSILLECTOMY    ? ? ?Medical History: ?Past Medical History:  ?Diagnosis Date  ? Alcohol abuse   ? Benzodiazepine dependence ([place])   ? Benzodiazepine withdrawal ([place])   ? Chronic pain in right foot   ? COPD (chronic obstructive pulmonary disease) ([place])   ? Depression 08/19/2017  ? Depression   ? Hepatitis C   ? Hypertension   ? 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 Seizures ([place]) 2010  ? Seizures ([place])   ? Sleep apnea   ? Syncope   ? questionable vasovagal  ? Syncope   ? ? ?Family History: ?Family History  ?Problem Relation Age of Onset  ? Arthritis Mother   ? Hypertension Mother   ? Cancer Father   ? Kidney disease Father   ? Alcohol abuse Paternal Aunt   ? Depression Maternal Grandfather   ? ? ?Social History  ? ?Socioeconomic History  ? Marital status: Divorced  ?  Spouse name: Not on file  ? Number of children: Not on file  ? Years of education: Not on file  ? Highest education level: Not on file  ?Occupational History  ? Not on file  ?Tobacco Use  ? Smoking status: Former  ?  Packs/day: 0.50  ?  Years: 50.00  ?  Pack years: 25.00  ?  Types: Cigarettes  ?  Quit date: 02/05/2019  ?  Years since quitting: 2.5  ? Smokeless tobacco: Never  ?Vaping Use  ? Vaping Use: Never used  ?Substance and Sexual Activity  ? Alcohol use: Not Currently  ? Drug use: Not Currently  ? Sexual activity: Not Currently  ?Other Topics Concern  ? Not on  file  ?Social History Narrative  ? ** Merged History Encounter **  ?    ? ?Social Determinants of Health  ? ?Financial Resource Strain: Not on file  ?Food Insecurity: Not on file  ?Transportation Needs: Not on file  ?Physical Activity: Not on file  ?Stress: Not on file  ?Social Connections: Not on file  ?Intimate Partner Violence: Not on file  ? ? ? ? ?Review of Systems  ?Constitutional:  Negative for chills, fatigue and fever.  ?HENT:  Negative for congestion, postnasal drip, rhinorrhea, sinus pressure, sinus pain, sneezing and sore throat.   ?Respiratory:  Positive for cough and chest tightness. Negative for shortness of breath and wheezing.   ?  Cardiovascular: Negative.  Negative for chest pain and palpitations.  ?Gastrointestinal:  Negative for abdominal pain, constipation, diarrhea, nausea and vomiting.  ?Musculoskeletal:  Negative for myalgias.  ?Skin:  Negative for rash.  ?Neurological:  Negative for headaches.  ? ?Vital Signs: ?BP (!) 150/78   Pulse 86   Temp 98.6 ?F (37 ?C)   Resp 16   Ht 5' 9.5" (1.765 m)   Wt 207 lb (93.9 kg)   BMI 30.13 kg/m?  ? ? ?Observation/Objective: ?He is alert and oriented and engages in conversation appropriately.  He does not sound as though he is in any acute distress over telephone call. ? ? ? ?Assessment/Plan: ?1. Acute bacterial bronchitis ?Empiric antibiotic prescribed  ?- azithromycin (ZITHROMAX) 250 MG tablet; Take one tab a day for 10 days for uri  Dispense: 10 tablet; Refill: 0 ? ?2. Acute cough ?Medication prescribed for symptomatic relief of cough ?- benzonatate (TESSALON) 100 MG capsule; Take 1 capsule (100 mg total) by mouth 2 (two) times daily as needed.  Dispense: 30 capsule; Refill: 0 ?- chlorpheniramine-HYDROcodone (TUSSIONEX PENNKINETIC ER) 10-8 MG/5ML; Take 5 mLs by mouth every 12 (twelve) hours as needed for cough.  Dispense: 140 mL; Refill: 0 ? ? ? ?General Counseling: arbor cohen understanding of the findings of today's phone visit and agrees  with plan of treatment. I have discussed any further diagnostic evaluation that may be needed or ordered today. We also reviewed his medications today. he has been encouraged to call the office with any questio

## 2021-08-27 NOTE — Telephone Encounter (Signed)
Dr. Holley Raring said to call him and cancel his appt. Due to his lab results, per [facility], so I called and canceled. He also said he would have to have a clean UDS before he could come back to clinic. ?

## 2021-08-27 NOTE — Telephone Encounter (Signed)
[name], ?       His appointment was cancelled by you on 04/12 and reason states "labs". Can you give me the specifics on this. Patient is calling and asking if he can reschedule and I need the info. Thanks. ?

## 2021-08-27 NOTE — Telephone Encounter (Signed)
Patient called and informed. States will come by and give urine and understands that he must test negative in order to secure another appointment in the clinic. ?

## 2021-08-27 NOTE — Telephone Encounter (Signed)
He said a nurse called him and told him not to come to his appt today and he wants to know if he can r/s. He said they found THC in his labs so I told him I had to ask somebody what was going on ?

## 2021-09-05 DIAGNOSIS — G894 Chronic pain syndrome: Secondary | ICD-10-CM | POA: Diagnosis not present

## 2021-09-05 DIAGNOSIS — T451X5A Adverse effect of antineoplastic and immunosuppressive drugs, initial encounter: Secondary | ICD-10-CM | POA: Diagnosis not present

## 2021-09-05 DIAGNOSIS — G620 Drug-induced polyneuropathy: Secondary | ICD-10-CM | POA: Diagnosis not present

## 2021-09-11 ENCOUNTER — Telehealth: Payer: Self-pay | Admitting: Student in an Organized Health Care Education/Training Program

## 2021-09-11 LAB — COMPLIANCE DRUG ANALYSIS, UR

## 2021-09-11 NOTE — Telephone Encounter (Signed)
Returned patient phone call re UDS.  THC levels have decreased from 111 to 15 which is good.  The rest of the UDS is unremarkable.  Encouraged patient to keep his next appt on 09/18/21 and see if at that time [facility] would be willing to prescribe for him.  Patient verbalizes u/o information.   ?

## 2021-09-18 ENCOUNTER — Encounter: Payer: Self-pay | Admitting: Student in an Organized Health Care Education/Training Program

## 2021-09-18 ENCOUNTER — Telehealth: Payer: Self-pay | Admitting: Student in an Organized Health Care Education/Training Program

## 2021-09-18 ENCOUNTER — Ambulatory Visit
Payer: Medicare HMO | Attending: Student in an Organized Health Care Education/Training Program | Admitting: Student in an Organized Health Care Education/Training Program

## 2021-09-18 VITALS — BP 146/74 | HR 81 | Temp 96.4°F | Resp 16 | Ht 69.0 in | Wt 217.0 lb

## 2021-09-18 DIAGNOSIS — G620 Drug-induced polyneuropathy: Secondary | ICD-10-CM | POA: Insufficient documentation

## 2021-09-18 DIAGNOSIS — G894 Chronic pain syndrome: Secondary | ICD-10-CM | POA: Diagnosis not present

## 2021-09-18 DIAGNOSIS — T451X5A Adverse effect of antineoplastic and immunosuppressive drugs, initial encounter: Secondary | ICD-10-CM | POA: Diagnosis not present

## 2021-09-18 DIAGNOSIS — D701 Agranulocytosis secondary to cancer chemotherapy: Secondary | ICD-10-CM | POA: Insufficient documentation

## 2021-09-18 DIAGNOSIS — R69 Illness, unspecified: Secondary | ICD-10-CM | POA: Diagnosis not present

## 2021-09-18 DIAGNOSIS — C119 Malignant neoplasm of nasopharynx, unspecified: Secondary | ICD-10-CM | POA: Diagnosis not present

## 2021-09-18 DIAGNOSIS — F321 Major depressive disorder, single episode, moderate: Secondary | ICD-10-CM | POA: Insufficient documentation

## 2021-09-18 MED ORDER — BUPRENORPHINE 5 MCG/HR TD PTWK: 1.0000 | MEDICATED_PATCH | TRANSDERMAL | 0 refills | Status: AC

## 2021-09-18 MED ORDER — BUPRENORPHINE 7.5 MCG/HR TD PTWK: 1.0000 | MEDICATED_PATCH | TRANSDERMAL | 0 refills | Status: DC

## 2021-09-18 NOTE — Patient Instructions (Signed)
Sign pain contract ?Start butrans patch with titration instructions ?

## 2021-09-18 NOTE — Telephone Encounter (Signed)
Patient states he went to the pharmacy and need Prior Auth. Please advise patient when completed ?

## 2021-09-18 NOTE — Telephone Encounter (Signed)
Lori did this 09/18/2021. ?

## 2021-09-18 NOTE — Progress Notes (Signed)
PROVIDER NOTE: Information contained herein reflects review and annotations entered in association with encounter. Interpretation of such information and data should be left to medically-trained personnel. Information provided to patient can be located elsewhere in the medical record under "Patient Instructions". Document created using STT-dictation technology, any transcriptional errors that may result from process are unintentional.  ?  ?Patient: [name]  Service Category: E/M  Provider: Gillis Santa, MD  ?DOB: 06-19-1950  DOS: 09/18/2021  Specialty: Interventional Pain Management  ?MRN: [id number]  Setting: Ambulatory outpatient  PCP: Anthony Latina, PA-C  ?Type: Established Patient    Referring Provider: Mylinda Latina, PA*  ?Location: Office  Delivery: Face-to-face    ? ?HPI  ?[name], a 72 y.o. 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 [name] primary complain today is Foot Pain (bilat) and Facial Pain ?Last encounter: My last encounter with him was on 08/02/2021. 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 ?Pain Assessment: Severity of Chronic pain is reported as a 6 /10. Location: Foot (face pain) Right, Left/feet radiate up into the legs. Onset: More than a month ago. Quality: Discomfort, Constant, Stabbing. Timing: Constant. Modifying factor(s): nothing currently ?Vitals:  height is '5\' 9"'$  (1.753 m) and weight is 217 lb (98.4 kg). His temporal temperature is 96.4 ?F (35.8 ?C) (abnormal). His blood pressure is 146/74 (abnormal) and his pulse is 81. His respiration is 16 and oxygen saturation is 98%.  ? ?Reason for encounter:  ? ?Patient presents today for follow-up.  His urine toxicology screen has downtrending for THC.  He states that  he is no longer utilizing the [place].  At this point we will consider buprenorphine for chronic pain management.  I will have the patient's sign a pain contract.  We will start at a dose of 5 mcg and have him titrate up to 7.5 mcg after 4 weeks. ? ? ?08/16/2021: ?[name] follows up today for his second patient visit.  I reviewed his urine toxicology screen with him today which was positive for THC.  I informed the patient that we will need a clean urine toxicology screen before we are able to consider any controlled substances such as tramadol or buprenorphine.  Patient endorsed understanding.  He states that he has been utilizing [place] which he states he will stop.  I informed him that I cannot prescribe him a controlled 2 or controlled 3 with him having an illicit substance in his urine.  Patient endorsed understanding.  We will repeat his urine toxicology screen today and he is instructed to follow-up in 10 days to review. ? ?HPI from initial clinic visit: 08/02/2021 ?[name] is a pleasant 72 year old male who presents with chronic pain related to chemotherapy induced neuropathy.  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  He describes burning and tingling along his face and in his extremities.  He has tried trigger trial in the past with limited response.  He has been seen by neurology and has been managed on various OT convulsants and neuropathic's including gabapentin which she is currently taking at 600 mg 3 times daily to 4 times daily.  He has also tried TCA with amitriptyline and is currently on 50 mg amitriptyline nightly.  He has also tried tizanidine in the past.  He is not a  diabetic.  He has done physical therapy in the past.  When I asked him about his positive urine toxicology screen in 2021 for cocaine, he states that that was a lab error. ?  ?I informed the patient that his only medication option here would be buprenorphine for chronic pain  management. ? ?Pharmacotherapy Assessment  ?Start buprenorphine at 5 mcg an hour ? ?Monitoring: ?[place] PMP: PDMP reviewed during this encounter.       ? ? ?Anthony Patience, RN  09/18/2021 10:57 AM  Sign when Signing Visit ?Safety precautions to be maintained throughout the outpatient stay will include: orient to surroundings, keep bed in low position, maintain call bell within reach at all times, provide assistance with transfer out of bed and ambulation.  ?   UDS:  ?Summary  ?Date Value Ref Range Status  ?09/05/2021 Note  Final  ?  Comment:  ?  ==================================================================== ?Compliance Drug Analysis, Ur ?==================================================================== ?Test                             Result       Flag       Units ? ?Drug Present and Declared for Prescription Verification ?  Gabapentin                     PRESENT      EXPECTED ?  Amitriptyline                  PRESENT      EXPECTED ?  Nortriptyline                  PRESENT      EXPECTED ?   Nortriptyline is an expected metabolite of amitriptyline. ? ?  Chlorpheniramine               PRESENT      EXPECTED ?  Hydroxyzine                    PRESENT      EXPECTED ? ?Drug Present not Declared for Prescription Verification ?  Carboxy-THC                    15           UNEXPECTED ng/mg creat ?   Carboxy-THC is a metabolite of tetrahydrocannabinol (THC). Source of ?   THC is most commonly herbal marijuana or marijuana-based products, ?   but THC is also present in a scheduled prescription medication. ?   Trace amounts of THC can be present in hemp and cannabidiol (CBD) ?   products. This test is not intended to distinguish between delta-9- ?   tetrahydrocannabinol, the predominant form of THC in most herbal or ?   marijuana-based products, and delta-8-tetrahydrocannabinol. ? ?  Doxylamine                     PRESENT      UNEXPECTED ? ?Drug Absent but Declared for Prescription Verification ?  Hydrocodone                     Not Detected UNEXPECTED ng/mg creat ?  Tizanidine                     Not Detected UNEXPECTED ?   Tizanidine, as indicated in the declared medication list, is not ?   always detected  even when used as directed. ? ?  Salicylate                     Not Detected UNEXPECTED ?   Aspirin, as indicated in the declared medication list, is not always ?   detected even when used as directed. ? ?==================================================================== ?Test                      Result    Flag   Units      Ref Range ?  Creatinine              26               mg/dL      >=20 ?==================================================================== ?Declared Medications: ? The flagging and interpretation on this report are based on the ? following declared medications.  Unexpected results may arise from ? inaccuracies in the declared medications. ? ? **Note: The testing scope of this panel includes these medications: ? ? Amitriptyline (Elavil) ? Chlorpheniramine ? Gabapentin (Neurontin) ? Hydrocodone ? Hydroxyzine (Vistaril) ? ? **Note: The testing scope of this panel does not include small to ? moderate amounts of these reported medications: ? ? Aspirin ? Tizanidine (Zanaflex) ? ? **Note: The testing scope of this panel does not include the ? following reported medications: ? ? Amlodipine (Norvasc) ? Ammonium Hydroxide (Ammonium Lactate) ? Azithromycin (Zithromax) ? Benzonatate (Tessalon) ? Fluoride (Biotene) ? Lactic Acid (Ammonium Lactate) ? Mouthwash ? Multivitamin ? Omeprazole (Prilosec) ? Rosuvastatin (Crestor) ?==================================================================== ?For clinical consultation, please call [phone]. ?==================================================================== ?  ?  ? ?ROS  ?Constitutional: Denies any fever or chills ?Gastrointestinal: No reported hemesis, hematochezia, vomiting, or acute GI distress ?Musculoskeletal: Denies any acute onset joint swelling, redness,  loss of ROM, or weakness ?Neurological:  Lower extremity paresthesias ? ?Medication Review  ?amLODipine, amitriptyline, ammonium lactate, antiseptic oral rinse, aspirin EC, azithromycin, benzonatate, buprenorphi

## 2021-09-18 NOTE — Progress Notes (Signed)
Safety precautions to be maintained throughout the outpatient stay will include: orient to surroundings, keep bed in low position, maintain call bell within reach at all times, provide assistance with transfer out of bed and ambulation.  

## 2021-09-25 ENCOUNTER — Telehealth: Payer: Self-pay | Admitting: Student in an Organized Health Care Education/Training Program

## 2021-09-25 NOTE — Telephone Encounter (Signed)
Pt stated that his insurance company needed a prior authorization from [place]. Pt said he had to come out of pocket for his meds and wants the insurance company to reimburse him his money back. Please call Pt with an update. ?

## 2021-09-27 ENCOUNTER — Ambulatory Visit (INDEPENDENT_AMBULATORY_CARE_PROVIDER_SITE_OTHER): Payer: Medicare HMO | Admitting: Physician Assistant

## 2021-09-27 ENCOUNTER — Encounter: Payer: Self-pay | Admitting: Physician Assistant

## 2021-09-27 VITALS — BP 138/70 | HR 74 | Temp 97.7°F | Resp 16 | Ht 69.0 in | Wt 218.0 lb

## 2021-09-27 DIAGNOSIS — M5442 Lumbago with sciatica, left side: Secondary | ICD-10-CM

## 2021-09-27 DIAGNOSIS — R69 Illness, unspecified: Secondary | ICD-10-CM | POA: Diagnosis not present

## 2021-09-27 DIAGNOSIS — I10 Essential (primary) hypertension: Secondary | ICD-10-CM

## 2021-09-27 DIAGNOSIS — M5441 Lumbago with sciatica, right side: Secondary | ICD-10-CM

## 2021-09-27 DIAGNOSIS — F411 Generalized anxiety disorder: Secondary | ICD-10-CM

## 2021-09-27 DIAGNOSIS — G894 Chronic pain syndrome: Secondary | ICD-10-CM

## 2021-09-27 DIAGNOSIS — M940 Chondrocostal junction syndrome [Tietze]: Secondary | ICD-10-CM

## 2021-09-27 MED ORDER — TIZANIDINE HCL 4 MG PO TABS: 4.0000 mg | ORAL_TABLET | Freq: Four times a day (QID) | ORAL | 1 refills | Status: DC | PRN

## 2021-09-27 MED ORDER — HYDROXYZINE PAMOATE 25 MG PO CAPS: ORAL_CAPSULE | ORAL | 0 refills | Status: DC

## 2021-09-27 NOTE — Progress Notes (Signed)
[facility] [place], [place] [address]  Internal MEDICINE  Office Visit Note  Patient Name: [name]  [id number]  [id number]  Date of Service: 10/03/2021  Chief Complaint  Patient presents with   Follow-up   Depression   Hypertension   Cough    Had a bad cough last month, cough has left patient with rib pain, hurts to move certain ways   Quality Metric Gaps    Shingles and Pneumonia Vaccine    HPI Pt is here for routine follow up -Cough has improved, but has a little lingering right side rib pain that bothers him when he moves certain ways. Denies any SOB or difficulty breathing and is able to take deep breaths -Followed by pain management now and is doing better. He is having trouble with insurance covering meds now and is in the process of gaining approval. -tried to have labs done but unable to get enough blood at that time and will go back again to retry -Does not check BP at home, but does take his medication daily  Current Medication: Outpatient Encounter Medications as of 09/27/2021  Medication Sig   amitriptyline (ELAVIL) 50 MG tablet Take 1 tablet (50 mg total) by mouth at bedtime.   amLODipine (NORVASC) 5 MG tablet Take 1 tablet (5 mg total) by mouth daily.   ammonium lactate (AMLACTIN) 12 % lotion Apply 1 application. topically as needed for dry skin.   antiseptic oral rinse (BIOTENE) LIQD 15 mLs by Mouth Rinse route as needed for dry mouth.   aspirin EC 81 MG tablet Take 81 mg by mouth daily. Swallow whole.   benzonatate (TESSALON) 100 MG capsule Take 1 capsule (100 mg total) by mouth 2 (two) times daily as needed.   buprenorphine (BUTRANS) 5 MCG/HR PTWK Place 1 patch onto the skin once a week for 28 days.   [START ON 10/16/2021] buprenorphine (BUTRANS) 7.5 MCG/HR Place 1 patch onto the skin once a week for 28 days.   chlorpheniramine-HYDROcodone (TUSSIONEX PENNKINETIC ER) 10-8 MG/5ML Take 5 mLs by mouth every 12 (twelve) hours  as needed for cough.   gabapentin (NEURONTIN) 600 MG tablet Take 1 tablet (600 mg total) by mouth 3 (three) times daily. (Patient taking differently: Take 600 mg by mouth in the morning, at noon, in the evening, and at bedtime.)   magic mouthwash w/lidocaine SOLN Take 5 mLs by mouth 4 (four) times daily as needed for mouth pain.   Multiple Vitamin (MULTIVITAMIN WITH MINERALS) TABS tablet Take 1 tablet by mouth daily.   omeprazole (PRILOSEC) 40 MG capsule Take 1 capsule (40 mg total) by mouth daily.   rosuvastatin (CRESTOR) 5 MG tablet Take 1 tablet (5 mg total) by mouth daily.   [DISCONTINUED] azithromycin (ZITHROMAX) 250 MG tablet Take one tab a day for 10 days for uri   [DISCONTINUED] hydrOXYzine (VISTARIL) 25 MG capsule TAKE 1 CAPSULE BY MOUTH EVERY 8 HOURS ASNEEDED   [DISCONTINUED] tiZANidine (ZANAFLEX) 4 MG tablet TAKE 1 TABLET BY MOUTH EVERY 6 HOURS AS NEEDED FOR MUSCLE SPASMS   hydrOXYzine (VISTARIL) 25 MG capsule TAKE 1 CAPSULE BY MOUTH EVERY 8 HOURS ASNEEDED   tiZANidine (ZANAFLEX) 4 MG tablet Take 1 tablet (4 mg total) by mouth every 6 (six) hours as needed for muscle spasms.   Facility-Administered Encounter Medications as of 09/27/2021  Medication   heparin lock flush 100 unit/mL    Surgical History: Past Surgical History:  Procedure Laterality Date   COLONOSCOPY WITH  PROPOFOL N/A 12/16/2016   Procedure: COLONOSCOPY WITH PROPOFOL;  Surgeon: Lollie Sails, MD;  Location: [facility] ENDOSCOPY;  Service: Endoscopy;  Laterality: N/A;   COLONOSCOPY WITH PROPOFOL N/A 03/14/2017   Procedure: COLONOSCOPY WITH PROPOFOL;  Surgeon: Lollie Sails, MD;  Location: [facility] ENDOSCOPY;  Service: Endoscopy;  Laterality: N/A;   COLONOSCOPY WITH PROPOFOL     FOOT SURGERY Right 03/12/2002   FOOT SURGERY     HEMORRHOID SURGERY     LITHOTRIPSY     MYRINGOTOMY     MYRINGOTOMY WITH TUBE PLACEMENT Left 04/28/2019   Procedure: MYRINGOTOMY WITH TUBE PLACEMENT;  Surgeon: Carloyn Manner, MD;  Location:  [place];  Service: ENT;  Laterality: Left;   NASOPHARYNGOSCOPY N/A 04/28/2019   Procedure: NASOPHARYNGOSCOPY WITH BIOPSY;  Surgeon: Carloyn Manner, MD;  Location: [place];  Service: ENT;  Laterality: N/A;   NASOPHARYNGOSCOPY     Port a cath Placement     PORTA CATH INSERTION N/A 05/20/2019   Procedure: PORTA CATH INSERTION;  Surgeon: Algernon Huxley, MD;  Location: [facility];  Service: Cardiovascular;  Laterality: N/A;   PORTA CATH REMOVAL N/A 11/29/2020   Procedure: PORTA CATH REMOVAL;  Surgeon: Algernon Huxley, MD;  Location: [facility];  Service: Cardiovascular;  Laterality: N/A;   TONSILLECTOMY      Medical History: Past Medical History:  Diagnosis Date   Alcohol abuse    Benzodiazepine dependence (HCC)    Benzodiazepine withdrawal (HCC)    Chronic pain in right foot    COPD (chronic obstructive pulmonary disease) ([place])    Depression 08/19/2017   Depression    Hepatitis C    Hypertension    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HCC)    Seizures ([place]) 2010   Seizures (HCC)    Sleep apnea    Syncope    questionable vasovagal   Syncope     Family History: Family History  Problem Relation Age of Onset   Arthritis Mother    Hypertension Mother    Cancer Father    Kidney disease Father    Alcohol abuse Paternal Aunt    Depression Maternal Grandfather     Social History   Socioeconomic History   Marital status: Divorced    Spouse name: Not on file   Number of children: Not on file   Years of education: Not on file   Highest education level: Not on file  Occupational History   Not on file  Tobacco Use   Smoking status: Former    Packs/day: 0.50    Years: 50.00    Pack years: 25.00    Types: Cigarettes    Quit date: 02/05/2019    Years since quitting: 2.6   Smokeless tobacco: Never  Vaping Use   Vaping Use: Never used  Substance and Sexual Activity   Alcohol use: Not Currently   Drug use: Not Currently   Sexual activity: Not  Currently  Other Topics Concern   Not on file  Social History Narrative   ** Merged History Encounter **       Social Determinants of Health   Financial Resource Strain: Not on file  Food Insecurity: Not on file  Transportation Needs: Not on file  Physical Activity: Not on file  Stress: Not on file  Social Connections: Not on file  Intimate Partner Violence: Not on file      Review of Systems  Constitutional:  Negative for chills, fatigue and unexpected weight change.  HENT:  Negative for congestion, postnasal drip, rhinorrhea, sneezing and sore throat.   Eyes:  Negative for redness.  Respiratory:  Negative for cough, chest tightness and shortness of breath.   Cardiovascular:  Negative for chest pain and palpitations.  Gastrointestinal:  Negative for abdominal pain, constipation, diarrhea, nausea and vomiting.  Genitourinary:  Negative for dysuria and frequency.  Musculoskeletal:  Positive for arthralgias and back pain. Negative for joint swelling and neck pain.  Skin:  Negative for rash.  Neurological: Negative.  Negative for tremors and numbness.       Neuralgia in both feet  Hematological:  Negative for adenopathy. Does not bruise/bleed easily.  Psychiatric/Behavioral:  Negative for behavioral problems (Depression), sleep disturbance and suicidal ideas. The patient is nervous/anxious.    Vital Signs: BP 138/70 Comment: 145/74  Pulse 74   Temp 97.7 F (36.5 C)   Resp 16   Ht '5\' 9"'$  (1.753 m)   Wt 218 lb (98.9 kg)   SpO2 96%   BMI 32.19 kg/m    Physical Exam Vitals and nursing note reviewed.  Constitutional:      General: He is not in acute distress.    Appearance: He is well-developed. He is not diaphoretic.  HENT:     Head: Normocephalic and atraumatic.     Mouth/Throat:     Pharynx: No oropharyngeal exudate.  Eyes:     Pupils: Pupils are equal, round, and reactive to light.  Neck:     Thyroid: No thyromegaly.     Vascular: No JVD.     Trachea: No  tracheal deviation.  Cardiovascular:     Rate and Rhythm: Normal rate and regular rhythm.     Heart sounds: Normal heart sounds. No murmur heard.   No friction rub. No gallop.  Pulmonary:     Effort: Pulmonary effort is normal. No respiratory distress.     Breath sounds: No wheezing or rales.     Comments: Right sided chest wall tenderness Chest:     Chest wall: Tenderness present.  Abdominal:     General: Bowel sounds are normal.     Palpations: Abdomen is soft.  Musculoskeletal:        General: Normal range of motion.     Cervical back: Normal range of motion and neck supple.  Lymphadenopathy:     Cervical: No cervical adenopathy.  Skin:    General: Skin is warm and dry.  Neurological:     Mental Status: He is alert and oriented to person, place, and time.     Cranial Nerves: No cranial nerve deficit.  Psychiatric:        Behavior: Behavior normal.        Thought Content: Thought content normal.        Judgment: Judgment normal.       Assessment/Plan: 1. Essential hypertension Stable, continue current medication  2. GAD (generalized anxiety disorder) May continue hydroxyzine as needed - hydrOXYzine (VISTARIL) 25 MG capsule; TAKE 1 CAPSULE BY MOUTH EVERY 8 HOURS ASNEEDED  Dispense: 30 capsule; Refill: 0  3. 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  Dispense: 30 tablet; Refill: 1  4. Chronic pain syndrome Followed by pain management  5. Costochondritis Likely secondary to coughing which has now subsided and should improve on its own, but may ice or use ibuprofen as needed  General Counseling: arzell mcgeehan understanding of the findings of todays visit and agrees with plan of treatment. I have discussed any further diagnostic evaluation that may be needed or ordered today. We also reviewed his medications  today. he has been encouraged to call the office with any questions or concerns that should arise related to todays visit.    No orders of the defined types were placed in this encounter.   Meds ordered this encounter  Medications   hydrOXYzine (VISTARIL) 25 MG capsule    Sig: TAKE 1 CAPSULE BY MOUTH EVERY 8 HOURS ASNEEDED    Dispense:  30 capsule    Refill:  0   tiZANidine (ZANAFLEX) 4 MG tablet    Sig: Take 1 tablet (4 mg total) by mouth every 6 (six) hours as needed for muscle spasms.    Dispense:  30 tablet    Refill:  1    This patient was seen by Drema Dallas, PA-C in collaboration with Dr. Clayborn Bigness as a part of collaborative care agreement.   Total time spent:30 Minutes Time spent includes review of chart, medications, test results, and follow up plan with the patient.      Dr Lavera Guise Internal medicine

## 2021-10-05 ENCOUNTER — Other Ambulatory Visit: Payer: Self-pay | Admitting: Physician Assistant

## 2021-10-05 DIAGNOSIS — I700 Atherosclerosis of aorta: Secondary | ICD-10-CM

## 2021-10-11 DIAGNOSIS — M26652 Arthropathy of left temporomandibular joint: Secondary | ICD-10-CM | POA: Diagnosis not present

## 2021-10-11 DIAGNOSIS — Z8522 Personal history of malignant neoplasm of nasal cavities, middle ear, and accessory sinuses: Secondary | ICD-10-CM | POA: Diagnosis not present

## 2021-10-11 DIAGNOSIS — H6980 Other specified disorders of Eustachian tube, unspecified ear: Secondary | ICD-10-CM | POA: Diagnosis not present

## 2021-10-29 ENCOUNTER — Other Ambulatory Visit: Payer: Self-pay | Admitting: Physician Assistant

## 2021-10-29 DIAGNOSIS — F411 Generalized anxiety disorder: Secondary | ICD-10-CM

## 2021-10-30 ENCOUNTER — Encounter: Payer: Medicare HMO | Admitting: Student in an Organized Health Care Education/Training Program

## 2021-11-01 ENCOUNTER — Ambulatory Visit: Payer: Medicare HMO | Admitting: Physician Assistant

## 2021-11-06 ENCOUNTER — Other Ambulatory Visit: Payer: Self-pay

## 2021-11-06 DIAGNOSIS — G620 Drug-induced polyneuropathy: Secondary | ICD-10-CM

## 2021-11-07 ENCOUNTER — Encounter: Payer: Self-pay | Admitting: Oncology

## 2021-11-07 ENCOUNTER — Ambulatory Visit
Payer: Medicare HMO | Attending: Student in an Organized Health Care Education/Training Program | Admitting: Student in an Organized Health Care Education/Training Program

## 2021-11-07 ENCOUNTER — Encounter: Payer: Self-pay | Admitting: Student in an Organized Health Care Education/Training Program

## 2021-11-07 ENCOUNTER — Inpatient Hospital Stay: Payer: Medicare HMO | Attending: Internal Medicine

## 2021-11-07 ENCOUNTER — Inpatient Hospital Stay (HOSPITAL_BASED_OUTPATIENT_CLINIC_OR_DEPARTMENT_OTHER): Payer: Medicare HMO | Admitting: Oncology

## 2021-11-07 ENCOUNTER — Other Ambulatory Visit: Payer: Self-pay

## 2021-11-07 VITALS — BP 138/79 | HR 71 | Temp 98.3°F | Resp 16 | Wt 223.0 lb

## 2021-11-07 VITALS — BP 142/62 | HR 83 | Temp 98.8°F | Resp 16 | Ht 69.0 in | Wt 222.0 lb

## 2021-11-07 DIAGNOSIS — Z79899 Other long term (current) drug therapy: Secondary | ICD-10-CM | POA: Diagnosis not present

## 2021-11-07 DIAGNOSIS — I10 Essential (primary) hypertension: Secondary | ICD-10-CM | POA: Diagnosis not present

## 2021-11-07 DIAGNOSIS — R69 Illness, unspecified: Secondary | ICD-10-CM | POA: Diagnosis not present

## 2021-11-07 DIAGNOSIS — T451X5A Adverse effect of antineoplastic and immunosuppressive drugs, initial encounter: Secondary | ICD-10-CM | POA: Diagnosis not present

## 2021-11-07 DIAGNOSIS — F321 Major depressive disorder, single episode, moderate: Secondary | ICD-10-CM | POA: Diagnosis not present

## 2021-11-07 DIAGNOSIS — D701 Agranulocytosis secondary to cancer chemotherapy: Secondary | ICD-10-CM | POA: Diagnosis not present

## 2021-11-07 DIAGNOSIS — Z923 Personal history of irradiation: Secondary | ICD-10-CM | POA: Diagnosis not present

## 2021-11-07 DIAGNOSIS — G620 Drug-induced polyneuropathy: Secondary | ICD-10-CM | POA: Insufficient documentation

## 2021-11-07 DIAGNOSIS — C119 Malignant neoplasm of nasopharynx, unspecified: Secondary | ICD-10-CM

## 2021-11-07 DIAGNOSIS — G894 Chronic pain syndrome: Secondary | ICD-10-CM | POA: Diagnosis not present

## 2021-11-07 DIAGNOSIS — Z7982 Long term (current) use of aspirin: Secondary | ICD-10-CM | POA: Diagnosis not present

## 2021-11-07 DIAGNOSIS — Z08 Encounter for follow-up examination after completed treatment for malignant neoplasm: Secondary | ICD-10-CM

## 2021-11-07 DIAGNOSIS — Z85818 Personal history of malignant neoplasm of other sites of lip, oral cavity, and pharynx: Secondary | ICD-10-CM | POA: Insufficient documentation

## 2021-11-07 DIAGNOSIS — Z9221 Personal history of antineoplastic chemotherapy: Secondary | ICD-10-CM | POA: Insufficient documentation

## 2021-11-07 DIAGNOSIS — T451X5D Adverse effect of antineoplastic and immunosuppressive drugs, subsequent encounter: Secondary | ICD-10-CM | POA: Insufficient documentation

## 2021-11-07 DIAGNOSIS — Z8589 Personal history of malignant neoplasm of other organs and systems: Secondary | ICD-10-CM | POA: Diagnosis not present

## 2021-11-07 LAB — CBC WITH DIFFERENTIAL/PLATELET
Abs Immature Granulocytes: 0.08 10*3/uL — ABNORMAL HIGH (ref 0.00–0.07)
Basophils Absolute: 0 10*3/uL (ref 0.0–0.1)
Basophils Relative: 1 %
Eosinophils Absolute: 0.3 10*3/uL (ref 0.0–0.5)
Eosinophils Relative: 5 %
HCT: 39.8 % (ref 39.0–52.0)
Hemoglobin: 13.4 g/dL (ref 13.0–17.0)
Immature Granulocytes: 1 %
Lymphocytes Relative: 22 %
Lymphs Abs: 1.3 10*3/uL (ref 0.7–4.0)
MCH: 31 pg (ref 26.0–34.0)
MCHC: 33.7 g/dL (ref 30.0–36.0)
MCV: 92.1 fL (ref 80.0–100.0)
Monocytes Absolute: 0.7 10*3/uL (ref 0.1–1.0)
Monocytes Relative: 12 %
Neutro Abs: 3.5 10*3/uL (ref 1.7–7.7)
Neutrophils Relative %: 59 %
Platelets: 147 10*3/uL — ABNORMAL LOW (ref 150–400)
RBC: 4.32 MIL/uL (ref 4.22–5.81)
RDW: 14.4 % (ref 11.5–15.5)
WBC: 5.8 10*3/uL (ref 4.0–10.5)
nRBC: 0 % (ref 0.0–0.2)

## 2021-11-07 LAB — COMPREHENSIVE METABOLIC PANEL
ALT: 13 U/L (ref 0–44)
AST: 20 U/L (ref 15–41)
Albumin: 3.8 g/dL (ref 3.5–5.0)
Alkaline Phosphatase: 56 U/L (ref 38–126)
Anion gap: 5 (ref 5–15)
BUN: 10 mg/dL (ref 8–23)
CO2: 29 mmol/L (ref 22–32)
Calcium: 8.1 mg/dL — ABNORMAL LOW (ref 8.9–10.3)
Chloride: 99 mmol/L (ref 98–111)
Creatinine, Ser: 1.12 mg/dL (ref 0.61–1.24)
GFR, Estimated: >60 mL/min (ref >60)
Glucose, Bld: 111 mg/dL — ABNORMAL HIGH (ref 70–99)
Potassium: 4 mmol/L (ref 3.5–5.1)
Sodium: 133 mmol/L — ABNORMAL LOW (ref 135–145)
Total Bilirubin: 0.5 mg/dL (ref 0.3–1.2)
Total Protein: 6.9 g/dL (ref 6.5–8.1)

## 2021-11-07 MED ORDER — BUPRENORPHINE 10 MCG/HR TD PTWK: 1.0000 | MEDICATED_PATCH | TRANSDERMAL | 2 refills | Status: DC

## 2021-11-07 NOTE — Progress Notes (Signed)
PROVIDER NOTE: Information contained herein reflects review and annotations entered in association with encounter. Interpretation of such information and data should be left to medically-trained personnel. Information provided to patient can be located elsewhere in the medical record under "Patient Instructions". Document created using STT-dictation technology, any transcriptional errors that may result from process are unintentional.    Patient: [name]  Service Category: E/M  Provider: Gillis Santa, MD  DOB: Sep 30, 1950  DOS: 11/07/2021  Specialty: Interventional Pain Management  MRN: [id number]  Setting: Ambulatory outpatient  PCP: [name]-C  Type: Established Patient    Referring Provider: Mylinda Latina, West*  Location: Office  Delivery: Face-to-face     HPI  [name], a 71 y.o. year old male, is here today because of his Chemotherapy-induced peripheral neuropathy ([name]) [G62.0, T45.1X5A]. [name] primary complain today is Peripheral Neuropathy (feet) Last encounter: My last encounter with him was on 09/18/2021  Pertinent problems: 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 Pain Assessment: Severity of Chronic pain is reported as a 6 /10. Location: Foot (face pain) Right, Left/feet radiate up into the legs. Onset: More than a month ago. Quality: Discomfort, Constant, Stabbing. Timing: Constant. Modifying factor(s): nothing currently Vitals:  height is 5' 9"  (1.753 m) and weight is 222 lb (100.7 kg). His temporal temperature is 98.8 F (37.1 C). His blood pressure is 142/62 (abnormal) and his pulse is 83. His respiration is 16 and oxygen saturation is 98%.   Reason for encounter:   Patient follows up today for medication management.  He is endorsing analgesic and functional benefit with buprenorphine.   His dose was increased from 5 mcg to 7.5 mcg and he is endorsing approximately 40% overall pain relief with the addition of his transdermal patch.  He is not having any side effects of cognitive changes, nausea, sedation, constipation.  He utilizes a laxative when needed.  We discussed increasing his dose to 10 mcg an hour to optimize analgesic response.  We will reevaluate in 10 to 12 weeks.  08/16/2021: [name] follows up today for his second patient visit.  I reviewed his urine toxicology screen with him today which was positive for THC.  I informed the patient that we will need a clean urine toxicology screen before we are able to consider any controlled substances such as tramadol or buprenorphine.  Patient endorsed understanding.  He states that he has been utilizing [place] which he states he will stop.  I informed him that I cannot prescribe him a controlled 2 or controlled 3 with him having an illicit substance in his urine.  Patient endorsed understanding.  We will repeat his urine toxicology screen today and he is instructed to follow-up in 10 days to review.  HPI from initial clinic visit: 08/02/2021 [name] is a pleasant 71 year old male who presents with chronic pain related to chemotherapy induced neuropathy.  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  He describes burning and tingling along his face and in his extremities.  He has tried trigger trial in the past with limited response.  He has been seen by neurology and has been managed on various OT convulsants and neuropathic's including gabapentin which she is currently taking at 600 mg 3 times daily to 4 times daily.  He has also tried TCA with amitriptyline and is currently on 50 mg  amitriptyline nightly.  He has also tried tizanidine in the past.  He is not a diabetic.  He has done physical therapy in the past.  When I asked him about his positive urine toxicology screen in 2021 for cocaine, he  states that that was a lab error.   I informed the patient that his only medication option here would be buprenorphine for chronic pain management.  Pharmacotherapy Assessment  Buprenorphine increased to 10 mcg an hour  Monitoring: [place] PMP: PDMP reviewed during this encounter.         [name], RN  11/07/2021  1:43 PM  Sign when Signing Visit Nursing Pain Medication Assessment:  Safety precautions to be maintained throughout the outpatient stay will include: orient to surroundings, keep bed in low position, maintain call bell within reach at all times, provide assistance with transfer out of bed and ambulation.  Medication Inspection Compliance: [name] did not comply with our request to bring his pills to be counted. He was reminded that bringing the medication bottles, even when empty, is a requirement.  Medication: Buprenorphine (Suboxone) Pill/Patch Count: No pills available to be counted. Pill/Patch Appearance: No markings Bottle Appearance: No container available. Did not bring bottle(s) to appointment. Last Medication intake:   put patch  on 1 week ago    UDS:  Summary  Date Value Ref Range Status  09/05/2021 Note  Final    Comment:    ==================================================================== Compliance Drug Analysis, Ur ==================================================================== Test                             Result       Flag       Units  Drug Present and Declared for Prescription Verification   Gabapentin                     PRESENT      EXPECTED   Amitriptyline                  PRESENT      EXPECTED   Nortriptyline                  PRESENT      EXPECTED    Nortriptyline is an expected metabolite of amitriptyline.    Chlorpheniramine               PRESENT      EXPECTED   Hydroxyzine                    PRESENT      EXPECTED  Drug Present not Declared for Prescription Verification   Carboxy-THC                    15            UNEXPECTED ng/mg creat    Carboxy-THC is a metabolite of tetrahydrocannabinol (THC). Source of    THC is most commonly herbal marijuana or marijuana-based products,    but THC is also present in a scheduled prescription medication.    Trace amounts of THC can be present in hemp and cannabidiol (CBD)    products. This test is not intended to distinguish between delta-9-    tetrahydrocannabinol, the predominant form of THC in most herbal or    marijuana-based products, and delta-8-tetrahydrocannabinol.    Doxylamine  PRESENT      UNEXPECTED  Drug Absent but Declared for Prescription Verification   Hydrocodone                    Not Detected UNEXPECTED ng/mg creat   Tizanidine                     Not Detected UNEXPECTED    Tizanidine, as indicated in the declared medication list, is not    always detected even when used as directed.    Salicylate                     Not Detected UNEXPECTED    Aspirin, as indicated in the declared medication list, is not always    detected even when used as directed.  ==================================================================== Test                      Result    Flag   Units      Ref Range   Creatinine              26               mg/dL      >=20 ==================================================================== Declared Medications:  The flagging and interpretation on this report are based on the  following declared medications.  Unexpected results may arise from  inaccuracies in the declared medications.   **Note: The testing scope of this panel includes these medications:   Amitriptyline (Elavil)  Chlorpheniramine  Gabapentin (Neurontin)  Hydrocodone  Hydroxyzine (Vistaril)   **Note: The testing scope of this panel does not include small to  moderate amounts of these reported medications:   Aspirin  Tizanidine (Zanaflex)   **Note: The testing scope of this panel does not include the  following reported  medications:   Amlodipine (Norvasc)  Ammonium Hydroxide (Ammonium Lactate)  Azithromycin (Zithromax)  Benzonatate (Tessalon)  Fluoride (Biotene)  Lactic Acid (Ammonium Lactate)  Mouthwash  Multivitamin  Omeprazole (Prilosec)  Rosuvastatin (Crestor) ==================================================================== For clinical consultation, please call [phone]. ====================================================================      ROS  Constitutional: Denies any fever or chills Gastrointestinal: No reported hemesis, hematochezia, vomiting, or acute GI distress Musculoskeletal: Denies any acute onset joint swelling, redness, loss of ROM, or weakness Neurological:  Lower extremity paresthesias  Medication Review  amLODipine, amitriptyline, ammonium lactate, antiseptic oral rinse, aspirin EC, benzonatate, buprenorphine, gabapentin, hydrOXYzine, magic mouthwash w/lidocaine, multivitamin with minerals, omeprazole, rosuvastatin, and tiZANidine  History Review  Allergy: Mr. Hincapie is allergic to opana [oxymorphone hcl], amoxicillin, amoxicillin, elemental sulfur, oxymorphone, oxymorphone, sulfa antibiotics, and sulfa antibiotics. Drug: Mr. Denardo  reports that he does not currently use drugs. Alcohol:  reports that he does not currently use alcohol. Tobacco:  reports that he quit smoking about 2 years ago. His smoking use included cigarettes. He has a 25.00 pack-year smoking history. He has never used smokeless tobacco. Social: Mr. Seldon  reports that he quit smoking about 2 years ago. His smoking use included cigarettes. He has a 25.00 pack-year smoking history. He has never used smokeless tobacco. He reports that he does not currently use alcohol. He reports that he does not currently use drugs. Medical:  has a past medical history of Alcohol abuse, Benzodiazepine dependence ([place]), Benzodiazepine withdrawal ([place]), Chronic pain in right foot, COPD (chronic obstructive  pulmonary disease) ([place]), Depression (08/19/2017), Depression, Hepatitis C, Hypertension, 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[place]), Seizures ([place]) (2010), Seizures ([place]), Sleep apnea, Syncope, and  Syncope. Surgical: Mr. Murdoch  has a past surgical history that includes Colonoscopy with propofol (N/A, 12/16/2016); Colonoscopy with propofol (N/A, 03/14/2017); Myringotomy with tube placement (Left, 04/28/2019); Nasopharyngoscopy (N/A, 04/28/2019); Foot surgery (Right, 03/12/2002); PORTA CATH INSERTION (N/A, 05/20/2019); Lithotripsy; Hemorrhoid surgery; Tonsillectomy; Colonoscopy with propofol; Myringotomy; Nasopharyngoscopy; Foot surgery; Port a cath Placement; and PORTA CATH REMOVAL (N/A, 11/29/2020). Family: family history includes Alcohol abuse in his paternal aunt; Arthritis in his mother; Cancer in his father; Depression in his maternal grandfather; Hypertension in his mother; Kidney disease in his father.  Laboratory Chemistry Profile   Renal Lab Results  Component Value Date   BUN 10 11/07/2021   CREATININE 1.12 11/07/2021   BCR 10 02/26/2018   GFRAA >60 12/11/2019   GFRNONAA >60 11/07/2021    Hepatic Lab Results  Component Value Date   AST 20 11/07/2021   ALT 13 11/07/2021   ALBUMIN 3.8 11/07/2021   ALKPHOS 56 11/07/2021   AMMONIA 12 11/03/2019    Electrolytes Lab Results  Component Value Date   NA 133 (L) 11/07/2021   K 4.0 11/07/2021   CL 99 11/07/2021   CALCIUM 8.1 (L) 11/07/2021   MG 2.1 12/11/2019    Bone No results found for: "VD25OH", "VD125OH2TOT", "BD5329JM4", "QA8341DQ2", "25OHVITD1", "25OHVITD2", "25OHVITD3", "TESTOFREE", "TESTOSTERONE"  Inflammation (CRP: Acute Phase) (ESR: Chronic Phase) No results found for: "CRP", "ESRSEDRATE", "LATICACIDVEN"       Note: Above Lab results reviewed.  Recent Imaging Review  US Carotid Duplex Bilateral Allyne Gee, MD     01/27/2021 11:13 AM [facility] [place], [place] [address]  DATE OF SERVICE: January 10, 2021  CAROTID DOPPLER INTERPRETATION:  Bilateral Carotid Ultrsasound and Color Doppler Examination was  performed. The RIGHT CCA shows no significant plaque in the  vessel. The LEFT CCA shows no significant plaque in the vessel.  There was no significant intimal thickening noted in the RIGHT  carotid artery. There was no significant intimal thickening in  the LEFT carotid artery.  The RIGHT CCA shows peak systolic velocity of 96 cm per second.  The end diastolic velocity is 35 cm per second on the RIGHT side.  The RIGHT ICA shows peak systolic velocity of 892 per second.  RIGHT sided ICA end diastolic velocity is 49 cm per second. The  RIGHT ECA shows a peak systolic velocity of 119 cm per second.  The ICA/CCA ratio is calculated to be 1.76. This suggests 50 to  69% stenosis. The Vertebral Artery shows antegrade flow.  The LEFT CCA shows peak systolic velocity of 417 cm per second.  The end diastolic velocity is 40 cm per second on the LEFT side.  The LEFT ICA shows peak systolic velocity of 408 per second. LEFT  sided ICA end diastolic velocity is 29 cm per second. The LEFT  ECA shows a peak systolic velocity of 144 cm per second. The  ICA/CCA ratio is calculated to be 0.85. This suggests less than  50% stenosis. The Vertebral Artery shows antegrade flow.  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 The LEFT CAROTID  shows less than 50% stenosis.  There is no significant plaque  formation noted on the LEFT and no significant on the RIGHT   side. Consider a repeat Carotid doppler if clinical situation and  symptoms warrant in 6-12 months. Patient should be encouraged to  change lifestyles such as smoking cessation, regular exercise and  dietary modification. Use of statins in the  right clinical  setting and ASA is encouraged.  Allyne Gee, MD [facility] Pulmonary Critical Care Medicine Note: Reviewed        Physical Exam  General appearance: Well nourished, well  developed, and well hydrated. In no apparent acute distress Mental status: Alert, oriented x 3 (person, place, & time)       Respiratory: No evidence of acute respiratory distress Eyes: PERLA Vitals: BP (!) 142/62 (BP Location: Right Arm, Patient Position: Sitting, Cuff Size: Large)   Pulse 83   Temp 98.8 F (37.1 C) (Temporal)   Resp 16   Ht 5' 9"  (1.753 m)   Wt 222 lb (100.7 kg)   SpO2 98%   BMI 32.78 kg/m  BMI: Estimated body mass index is 32.78 kg/m as calculated from the following:   Height as of this encounter: 5' 9"  (1.753 m).   Weight as of this encounter: 222 lb (100.7 kg). Ideal: Ideal body weight: 70.7 kg (155 lb 13.8 oz) Adjusted ideal body weight: 82.7 kg (182 lb 5.1 oz)  Assessment   Diagnosis Status  1. Chemotherapy-induced peripheral neuropathy ([place])   2. Chemotherapy induced neutropenia ([facility])   3. Depression, major, single episode, moderate ([place])   4. 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 Chronic pain syndrome    Controlled Controlled Controlled     Plan of Care   Analgesic and functional benefit with But[place] at 7.5 mcg an hour, will increase to 10 mcg an hour and assess response after 3 months. Consider lidocaine infusion for neuropathic pain Consider titration of amitriptyline to 75 or 100 mg nightly, defer to neurology. Continue with gabapentin as prescribed, consider dose escalation to 900 mg TID- defer to neurology. Consider spinal cord stimulation (dorsal column SCS trial) for lower extremity neuropathic pain. I was very clear and direct with [name] about treatment plan, specifically as it pertain to medication management   Requested Prescriptions   Signed Prescriptions Disp Refills   buprenorphine (BUTRANS) 10 MCG/HR PTWK 4 patch 2    Sig: Place 1 patch onto the skin once a week.     Orders:  No orders of the defined types were placed in this encounter.  Follow-up plan:   Return in about 3 months (around 02/07/2022) for Medication  Management, in person.    Recent Visits Date Type Provider Dept  09/18/21 Office Visit Anthony Santa, MD [facility]  08/16/21 Office Visit Anthony Santa, MD [facility]  Showing recent visits within past 90 days and meeting all other requirements Today's Visits Date Type Provider Dept  11/07/21 Office Visit Anthony Santa, MD [facility]  Showing today's visits and meeting all other requirements Future Appointments Date Type Provider Dept  01/29/22 Appointment Anthony Santa, MD [facility]  Showing future appointments within next 90 days and meeting all other requirements  I discussed the assessment and treatment plan with the patient. The patient was provided an opportunity to ask questions and all were answered. The patient agreed with the plan and demonstrated an understanding of the instructions.  Patient advised to call back or seek an in-person evaluation if the symptoms or condition worsens.  Duration of encounter: 78mnutes.  Note by: BGillis Santa MD Date: 11/07/2021; Time: 2:10 PM

## 2021-11-07 NOTE — Progress Notes (Signed)
Pt in for follow up continues to have dry mouth. Being seen at pain clinic for neuropathy pain.

## 2021-11-07 NOTE — Progress Notes (Signed)
Nursing Pain Medication Assessment:  Safety precautions to be maintained throughout the outpatient stay will include: orient to surroundings, keep bed in low position, maintain call bell within reach at all times, provide assistance with transfer out of bed and ambulation.  Medication Inspection Compliance: Mr. Wiederholt did not comply with our request to bring his pills to be counted. He was reminded that bringing the medication bottles, even when empty, is a requirement.  Medication: Buprenorphine (Suboxone) Pill/Patch Count: No pills available to be counted. Pill/Patch Appearance: No markings Bottle Appearance: No container available. Did not bring bottle(s) to appointment. Last Medication intake:   put patch  on 1 week ago

## 2021-11-07 NOTE — Progress Notes (Signed)
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  Pryor Ochoa for symptoms of ear pain and discharge.  Patient also noticed associated hearing loss and tinnitus.  Patient was noted to have erythema and edema in the left sphenopalatine fossa as well as an exophytic mass on NPL exam.  CT soft tissue of the neck showed asymmetric soft tissue within the left nasopharynx measuring 2.4 x 2 x 3.2 cm.  There is lateral bulging with encroachment upon the left parapharyngeal space without frank infiltration.  No extension into oropharynx or nasal cavity.  No skull base infiltration.  No appreciable mass within the larynx.  No pathologically enlarged cervical lymph nodes.  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  He also has right facial pain.  He was seen by both neurology and pain  clinic and is presently on gabapentin and buprenorphine patch which she is tolerating well.  Symptoms are tolerable at this time.  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  Denies any new pains.  Appetite and weight have remained stable.  ECOG PS- 1 Pain scale- 2 Opioid associated constipation- no  Review of systems- Review of Systems  HENT:         Dry mouth  Neurological:  Positive for sensory change (Peripheral neuropathy).      Allergies  Allergen Reactions   Opana [Oxymorphone Hcl]     Made him BLACKOUT   Amoxicillin Other (See Comments)   Amoxicillin    Elemental Sulfur     Childhood reaction    Oxymorphone Other (See Comments)   Oxymorphone    Sulfa Antibiotics Other (See Comments)   Sulfa Antibiotics      Past Medical History:  Diagnosis Date   Alcohol abuse    Benzodiazepine dependence ([place])    Benzodiazepine withdrawal (HCC)    Chronic pain in right foot    COPD (chronic obstructive pulmonary disease) ([place])    Depression 08/19/2017   Depression    Hepatitis C    Hypertension    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[place])    Seizures ([place]) 2010   Seizures ([place])    Sleep apnea    Syncope    questionable vasovagal   Syncope      Past Surgical History:  Procedure Laterality Date   COLONOSCOPY WITH PROPOFOL N/A 12/16/2016   Procedure: COLONOSCOPY WITH PROPOFOL;  Surgeon: Lollie Sails, MD;  Location: [facility] ENDOSCOPY;  Service: Endoscopy;  Laterality: N/A;  COLONOSCOPY WITH PROPOFOL N/A 03/14/2017   Procedure: COLONOSCOPY WITH PROPOFOL;  Surgeon: Lollie Sails, MD;  Location: [facility] ENDOSCOPY;  Service: Endoscopy;  Laterality: N/A;   COLONOSCOPY WITH PROPOFOL     FOOT SURGERY Right 03/12/2002   FOOT SURGERY     HEMORRHOID SURGERY     LITHOTRIPSY     MYRINGOTOMY     MYRINGOTOMY WITH TUBE PLACEMENT Left 04/28/2019   Procedure: MYRINGOTOMY WITH TUBE PLACEMENT;  Surgeon: Carloyn Manner, MD;  Location: [place];  Service: ENT;  Laterality: Left;   NASOPHARYNGOSCOPY N/A 04/28/2019   Procedure: NASOPHARYNGOSCOPY WITH BIOPSY;  Surgeon: Carloyn Manner, MD;  Location: [place];  Service: ENT;  Laterality: N/A;   NASOPHARYNGOSCOPY     Port a cath Placement     PORTA CATH INSERTION N/A 05/20/2019   Procedure: PORTA CATH INSERTION;  Surgeon: Algernon Huxley, MD;  Location: [place] CV LAB;  Service: Cardiovascular;  Laterality: N/A;   PORTA CATH REMOVAL N/A 11/29/2020   Procedure: PORTA CATH REMOVAL;  Surgeon: Algernon Huxley, MD;  Location: [place] CV LAB;  Service: Cardiovascular;  Laterality: N/A;   TONSILLECTOMY      Social History   Socioeconomic History   Marital status: Divorced    Spouse name: Not on file   Number of children: Not on file   Years of education: Not on file   Highest education level: Not on file  Occupational History   Not on file  Tobacco Use   Smoking status: Former    Packs/day: 0.50    Years: 50.00    Total pack years: 25.00    Types: Cigarettes    Quit date: 02/05/2019    Years since quitting: 2.7   Smokeless tobacco: Never  Vaping Use   Vaping Use: Never used  Substance and Sexual Activity   Alcohol use: Not Currently   Drug use: Not Currently   Sexual activity: Not Currently  Other Topics Concern   Not on file  Social History Narrative   ** Merged History Encounter **       Social Determinants of Health   Financial Resource Strain: Not on file  Food Insecurity: No Food Insecurity (05/27/2019)   Hunger Vital Sign    Worried About Running Out of Food in the Last Year: Never true    Ran Out of Food in the Last Year: Never true  Transportation Needs: No Transportation Needs (05/27/2019)   PRAPARE - Hydrologist (Medical): No    Lack of Transportation (Non-Medical): No  Physical Activity: Inactive (05/27/2019)   Exercise Vital Sign    Days of Exercise per Week: 0 days    Minutes of Exercise per Session: 0  min  Stress: Not on file  Social Connections: Not on file  Intimate Partner Violence: Not At Risk (05/27/2019)   Humiliation, Afraid, Rape, and Kick questionnaire    Fear of Current or Ex-Partner: No    Emotionally Abused: No    Physically Abused: No    Sexually Abused: No    Family History  Problem Relation Age of Onset   Arthritis Mother    Hypertension Mother    Cancer Father    Kidney disease Father    Alcohol abuse Paternal Aunt    Depression Maternal Grandfather      Current Outpatient Medications:    amitriptyline (ELAVIL) 50 MG tablet, Take 1 tablet (50 mg total) by mouth at bedtime., Disp: 60 tablet, Rfl:  2   amLODipine (NORVASC) 5 MG tablet, Take 1 tablet (5 mg total) by mouth daily., Disp: 90 tablet, Rfl: 1   ammonium lactate (AMLACTIN) 12 % lotion, Apply 1 application. topically as needed for dry skin., Disp: 400 g, Rfl: 0   antiseptic oral rinse (BIOTENE) LIQD, 15 mLs by Mouth Rinse route as needed for dry mouth., Disp: , Rfl:    aspirin EC 81 MG tablet, Take 81 mg by mouth daily. Swallow whole., Disp: , Rfl:    buprenorphine (BUTRANS) 7.5 MCG/HR, Place 1 patch onto the skin once a week for 28 days., Disp: 4 patch, Rfl: 0   gabapentin (NEURONTIN) 600 MG tablet, Take 1 tablet (600 mg total) by mouth 3 (three) times daily. (Patient taking differently: Take 600 mg by mouth in the morning, at noon, in the evening, and at bedtime.), Disp: 90 tablet, Rfl: 3   hydrOXYzine (VISTARIL) 25 MG capsule, TAKE 1 CAPSULE BY MOUTH EVERY 8 HOURS ASNEEDED., Disp: 30 capsule, Rfl: 0   Multiple Vitamin (MULTIVITAMIN WITH MINERALS) TABS tablet, Take 1 tablet by mouth daily., Disp: , Rfl:    omeprazole (PRILOSEC) 40 MG capsule, Take 1 capsule (40 mg total) by mouth daily., Disp: 30 capsule, Rfl: 3   rosuvastatin (CRESTOR) 5 MG tablet, TAKE 1 TABLET BY MOUTH DAILY., Disp: 90 tablet, Rfl: 3   tiZANidine (ZANAFLEX) 4 MG tablet, Take 1 tablet (4 mg total) by mouth every 6 (six) hours as needed for  muscle spasms., Disp: 30 tablet, Rfl: 1   benzonatate (TESSALON) 100 MG capsule, Take 1 capsule (100 mg total) by mouth 2 (two) times daily as needed. (Patient not taking: Reported on 11/07/2021), Disp: 30 capsule, Rfl: 0   magic mouthwash w/lidocaine SOLN, Take 5 mLs by mouth 4 (four) times daily as needed for mouth pain. (Patient not taking: Reported on 11/07/2021), Disp: 240 mL, Rfl: 2 No current facility-administered medications for this visit.  Facility-Administered Medications Ordered in Other Visits:    heparin lock flush 100 unit/mL, 500 Units, Intravenous, Once, Sindy Guadeloupe, MD  Physical exam:  Vitals:   11/07/21 1124  BP: 138/79  Pulse: 71  Resp: 16  Temp: 98.3 F (36.8 C)  TempSrc: Tympanic  SpO2: 96%  Weight: 223 lb (101.2 kg)   Physical Exam Constitutional:      General: He is not in acute distress. HENT:     Mouth/Throat:     Mouth: Mucous membranes are moist.     Pharynx: Oropharynx is clear.  Cardiovascular:     Rate and Rhythm: Normal rate and regular rhythm.     Heart sounds: Normal heart sounds.  Pulmonary:     Effort: Pulmonary effort is normal.     Breath sounds: Normal breath sounds.  Abdominal:     General: Bowel sounds are normal.     Palpations: Abdomen is soft.  Lymphadenopathy:     Comments: No palpable cervical or supraclavicular adenopathy  Skin:    General: Skin is warm and dry.  Neurological:     Mental Status: He is alert and oriented to person, place, and time.         Latest Ref Rng & Units 11/07/2021   10:59 AM  CMP  Glucose 70 - 99 mg/dL 111   BUN 8 - 23 mg/dL 10   Creatinine 0.61 - 1.24 mg/dL 1.12   Sodium 135 - 145 mmol/L 133   Potassium 3.5 - 5.1 mmol/L 4.0   Chloride 98 - 111 mmol/L 99  CO2 22 - 32 mmol/L 29   Calcium 8.9 - 10.3 mg/dL 8.1   Total Protein 6.5 - 8.1 g/dL 6.9   Total Bilirubin 0.3 - 1.2 mg/dL 0.5   Alkaline Phos 38 - 126 U/L 56   AST 15 - 41 U/L 20   ALT 0 - 44 U/L 13       Latest Ref Rng & Units  11/07/2021   10:59 AM  CBC  WBC 4.0 - 10.5 K/uL 5.8   Hemoglobin 13.0 - 17.0 g/dL 13.4   Hematocrit 39.0 - 52.0 % 39.8   Platelets 150 - 400 K/uL 147      Assessment and plan- Patient is a 71 y.o. 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  This is a routine follow-up visit  From nasopharyngeal cancer standpoint patient is doing well with no concerning signs and symptoms based on today's exam.  He is also following up with Dr. Pryor Ochoa every 6 months.  I will see him back in 6 months with labs.  Chemo-induced peripheral neuropathy: On gabapentin and buprenorphine patches.  He follows up with pain clinic   Visit Diagnosis 1. Nasopharyngeal carcinoma ([place])   2. Encounter for follow-up surveillance of head and neck cancer   3. Chemotherapy-induced peripheral neuropathy ([place])      Dr. Randa Evens, MD, MPH [facility] [phone] 11/07/2021 11:46 AM

## 2021-11-29 ENCOUNTER — Other Ambulatory Visit: Payer: Self-pay | Admitting: Internal Medicine

## 2021-12-03 ENCOUNTER — Other Ambulatory Visit: Payer: Self-pay

## 2021-12-07 ENCOUNTER — Ambulatory Visit
Admission: RE | Admit: 2021-12-07 | Discharge: 2021-12-07 | Disposition: A | Discharge status: Home / Self Care | Payer: Medicare HMO | Attending: Physician Assistant | Admitting: Physician Assistant

## 2021-12-07 ENCOUNTER — Encounter: Payer: Self-pay | Admitting: Physician Assistant

## 2021-12-07 ENCOUNTER — Ambulatory Visit
Admission: RE | Admit: 2021-12-07 | Discharge: 2021-12-07 | Disposition: A | Discharge status: Home / Self Care | Payer: Medicare HMO | Source: Ambulatory Visit | Attending: Physician Assistant | Admitting: Physician Assistant

## 2021-12-07 ENCOUNTER — Ambulatory Visit (INDEPENDENT_AMBULATORY_CARE_PROVIDER_SITE_OTHER): Payer: Medicare HMO | Admitting: Physician Assistant

## 2021-12-07 DIAGNOSIS — M5441 Lumbago with sciatica, right side: Secondary | ICD-10-CM | POA: Diagnosis not present

## 2021-12-07 DIAGNOSIS — R0781 Pleurodynia: Secondary | ICD-10-CM | POA: Diagnosis not present

## 2021-12-07 DIAGNOSIS — R0789 Other chest pain: Secondary | ICD-10-CM | POA: Diagnosis not present

## 2021-12-07 DIAGNOSIS — M5442 Lumbago with sciatica, left side: Secondary | ICD-10-CM | POA: Diagnosis not present

## 2021-12-07 DIAGNOSIS — I10 Essential (primary) hypertension: Secondary | ICD-10-CM | POA: Diagnosis not present

## 2021-12-07 MED ORDER — TIZANIDINE HCL 4 MG PO TABS: 4.0000 mg | ORAL_TABLET | Freq: Four times a day (QID) | ORAL | 1 refills | Status: DC | PRN

## 2021-12-07 NOTE — Progress Notes (Signed)
[facility] [place], [place] [address]  Internal MEDICINE  Office Visit Note  Patient Name: [name]  [id number]  [id number]  Date of Service: 12/07/2021  Chief Complaint  Patient presents with   Rib Injury    Having severe rib pain on right side, can not bend over or lift; seems to be top rib - patient thinks recent cough could have caused this     HPI Pt is here for a sick visit. -Has had some right side rib pain for the past 2 months. Sometimes hurts to take deep breaths but sometimes doesnt.  -he was seen in May after having upper respiratory infection with bad cough and had this right side rib pain after coughing and at the time was attributed to costochonditirs from coughing so much, Since then got a little better until this past week where it got much worse. Bending down to pick something up is painful -he does have chronic pain and is wearing pain patch currently, goes back in sept for his next follow up, but still feels the rib pain despite this. -Denies any current congestion or cough. Also denies any falls or injuries to ribs. No abdominal tenderness, just pinpoint tenderness over lowest rib on right. -Discussed checking chest xray. May try icing area and taking ibuprofen as needed -BP elevated in office likely due to pain and will monitor  Current Medication:  Outpatient Encounter Medications as of 12/07/2021  Medication Sig   amitriptyline (ELAVIL) 50 MG tablet Take 1 tablet (50 mg total) by mouth at bedtime.   amLODipine (NORVASC) 5 MG tablet Take 1 tablet (5 mg total) by mouth daily.   ammonium lactate (AMLACTIN) 12 % lotion Apply 1 application. topically as needed for dry skin.   antiseptic oral rinse (BIOTENE) LIQD 15 mLs by Mouth Rinse route as needed for dry mouth.   aspirin EC 81 MG tablet Take 81 mg by mouth daily. Swallow whole.   buprenorphine (BUTRANS) 10 MCG/HR PTWK Place 1 patch onto the skin once a week.    gabapentin (NEURONTIN) 600 MG tablet Take 1 tablet (600 mg total) by mouth in the morning, at noon, in the evening, and at bedtime.   hydrOXYzine (VISTARIL) 25 MG capsule TAKE 1 CAPSULE BY MOUTH EVERY 8 HOURS ASNEEDED.   magic mouthwash w/lidocaine SOLN Take 5 mLs by mouth 4 (four) times daily as needed for mouth pain.   Multiple Vitamin (MULTIVITAMIN WITH MINERALS) TABS tablet Take 1 tablet by mouth daily.   omeprazole (PRILOSEC) 40 MG capsule Take 1 capsule (40 mg total) by mouth daily.   rosuvastatin (CRESTOR) 5 MG tablet TAKE 1 TABLET BY MOUTH DAILY.   [DISCONTINUED] benzonatate (TESSALON) 100 MG capsule Take 1 capsule (100 mg total) by mouth 2 (two) times daily as needed.   [DISCONTINUED] tiZANidine (ZANAFLEX) 4 MG tablet Take 1 tablet (4 mg total) by mouth every 6 (six) hours as needed for muscle spasms.   tiZANidine (ZANAFLEX) 4 MG tablet Take 1 tablet (4 mg total) by mouth every 6 (six) hours as needed for muscle spasms.   Facility-Administered Encounter Medications as of 12/07/2021  Medication   heparin lock flush 100 unit/mL      Medical History: Past Medical History:  Diagnosis Date   Alcohol abuse    Benzodiazepine dependence (HCC)    Benzodiazepine withdrawal (HCC)    Chronic pain in right foot    COPD (chronic obstructive pulmonary disease) ([place])    Depression 08/19/2017  Depression    Hepatitis C    Hypertension    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[place])    Seizures ([place]) 2010   Seizures ([place])    Sleep apnea    Syncope    questionable vasovagal   Syncope      Vital Signs: BP (!) 154/83   Pulse 90   Temp 99 F (37.2 C)   Resp 16   Ht '5\' 9"'$  (1.753 m)   Wt 227 lb (103 kg)   SpO2 97%   BMI 33.52 kg/m    Review of Systems  Constitutional:  Negative for fatigue and fever.  HENT:  Negative for congestion, mouth sores and postnasal drip.   Respiratory:  Negative for cough, shortness of breath and wheezing.        Right rib pain  Cardiovascular:  Negative for chest  pain.  Gastrointestinal:  Negative for abdominal pain.  Genitourinary:  Negative for flank pain.  Musculoskeletal:  Positive for arthralgias.  Psychiatric/Behavioral: Negative.      Physical Exam Vitals and nursing note reviewed.  Constitutional:      General: He is not in acute distress.    Appearance: He is well-developed. He is not diaphoretic.  HENT:     Head: Normocephalic and atraumatic.     Mouth/Throat:     Pharynx: No oropharyngeal exudate.  Eyes:     Pupils: Pupils are equal, round, and reactive to light.  Neck:     Thyroid: No thyromegaly.     Vascular: No JVD.     Trachea: No tracheal deviation.  Cardiovascular:     Rate and Rhythm: Normal rate and regular rhythm.     Heart sounds: Normal heart sounds. No murmur heard.    No friction rub. No gallop.  Pulmonary:     Effort: Pulmonary effort is normal. No respiratory distress.     Breath sounds: No wheezing or rales.     Comments: Right sided chest wall tenderness over anterior lower right rib. No obvious deformity. No abdominal pain Chest:     Chest wall: Tenderness present.  Abdominal:     General: Bowel sounds are normal.     Palpations: Abdomen is soft.     Tenderness: There is no abdominal tenderness.  Musculoskeletal:        General: Normal range of motion.     Cervical back: Normal range of motion and neck supple.  Lymphadenopathy:     Cervical: No cervical adenopathy.  Skin:    General: Skin is warm and dry.  Neurological:     Mental Status: He is alert and oriented to person, place, and time.     Cranial Nerves: No cranial nerve deficit.  Psychiatric:        Behavior: Behavior normal.        Thought Content: Thought content normal.        Judgment: Judgment normal.       Assessment/Plan: 1. Rib pain on right side Will order xray for further evaluation. May try icing area and taking ibuprofen as needed. Already on pain patch with pain management - DG Chest 2 View; Future  2. Essential  hypertension Elevated in office, likely due to pain and will continue current medication and monitoring  3. 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  Dispense: 30 tablet; Refill: 1   General Counseling: Indie verbalizes understanding  of the findings of todays visit and agrees with plan of treatment. I have discussed any further diagnostic evaluation that may be needed or ordered today. We also reviewed his medications today. he has been encouraged to call the office with any questions or concerns that should arise related to todays visit.    Counseling:    Orders Placed This Encounter  Procedures   DG Chest 2 View    Meds ordered this encounter  Medications   tiZANidine (ZANAFLEX) 4 MG tablet    Sig: Take 1 tablet (4 mg total) by mouth every 6 (six) hours as needed for muscle spasms.    Dispense:  30 tablet    Refill:  1    Time spent:30 Minutes

## 2021-12-11 ENCOUNTER — Other Ambulatory Visit: Payer: Self-pay | Admitting: Internal Medicine

## 2021-12-11 ENCOUNTER — Other Ambulatory Visit: Payer: Self-pay

## 2021-12-12 ENCOUNTER — Telehealth: Payer: Self-pay

## 2021-12-12 NOTE — Telephone Encounter (Signed)
Spoke with patient regarding x-ray results. 

## 2021-12-12 NOTE — Telephone Encounter (Signed)
-----   Message from Mylinda Latina, PA-C sent at 12/10/2021  1:57 PM EDT ----- Please let him know that his xray came back normal except for some atherosclerosis in aorta, but no acute findings or fractures seen

## 2021-12-13 ENCOUNTER — Other Ambulatory Visit: Payer: Self-pay

## 2021-12-13 ENCOUNTER — Emergency Department: Payer: Medicare HMO

## 2021-12-13 ENCOUNTER — Encounter: Payer: Self-pay | Admitting: Intensive Care

## 2021-12-13 ENCOUNTER — Telehealth: Payer: Self-pay

## 2021-12-13 ENCOUNTER — Emergency Department
Admission: EM | Admit: 2021-12-13 | Discharge: 2021-12-13 | Disposition: A | Discharge status: Home / Self Care | Payer: Medicare HMO | Attending: Emergency Medicine | Admitting: Emergency Medicine

## 2021-12-13 DIAGNOSIS — R059 Cough, unspecified: Secondary | ICD-10-CM | POA: Diagnosis not present

## 2021-12-13 DIAGNOSIS — R1011 Right upper quadrant pain: Secondary | ICD-10-CM | POA: Diagnosis not present

## 2021-12-13 DIAGNOSIS — R079 Chest pain, unspecified: Secondary | ICD-10-CM | POA: Diagnosis not present

## 2021-12-13 DIAGNOSIS — R0789 Other chest pain: Secondary | ICD-10-CM | POA: Diagnosis not present

## 2021-12-13 DIAGNOSIS — R918 Other nonspecific abnormal finding of lung field: Secondary | ICD-10-CM | POA: Diagnosis not present

## 2021-12-13 DIAGNOSIS — R053 Chronic cough: Secondary | ICD-10-CM

## 2021-12-13 DIAGNOSIS — Z720 Tobacco use: Secondary | ICD-10-CM

## 2021-12-13 DIAGNOSIS — F17200 Nicotine dependence, unspecified, uncomplicated: Secondary | ICD-10-CM | POA: Insufficient documentation

## 2021-12-13 DIAGNOSIS — R69 Illness, unspecified: Secondary | ICD-10-CM | POA: Diagnosis not present

## 2021-12-13 DIAGNOSIS — J449 Chronic obstructive pulmonary disease, unspecified: Secondary | ICD-10-CM | POA: Diagnosis not present

## 2021-12-13 DIAGNOSIS — I10 Essential (primary) hypertension: Secondary | ICD-10-CM | POA: Insufficient documentation

## 2021-12-13 DIAGNOSIS — J929 Pleural plaque without asbestos: Secondary | ICD-10-CM | POA: Diagnosis not present

## 2021-12-13 DIAGNOSIS — J40 Bronchitis, not specified as acute or chronic: Secondary | ICD-10-CM | POA: Diagnosis not present

## 2021-12-13 DIAGNOSIS — K8020 Calculus of gallbladder without cholecystitis without obstruction: Secondary | ICD-10-CM | POA: Insufficient documentation

## 2021-12-13 DIAGNOSIS — Z85818 Personal history of malignant neoplasm of other sites of lip, oral cavity, and pharynx: Secondary | ICD-10-CM | POA: Diagnosis not present

## 2021-12-13 DIAGNOSIS — I700 Atherosclerosis of aorta: Secondary | ICD-10-CM | POA: Diagnosis not present

## 2021-12-13 LAB — CBC
HCT: 38 % — ABNORMAL LOW (ref 39.0–52.0)
Hemoglobin: 12.5 g/dL — ABNORMAL LOW (ref 13.0–17.0)
MCH: 30.8 pg (ref 26.0–34.0)
MCHC: 32.9 g/dL (ref 30.0–36.0)
MCV: 93.6 fL (ref 80.0–100.0)
Platelets: 129 10*3/uL — ABNORMAL LOW (ref 150–400)
RBC: 4.06 MIL/uL — ABNORMAL LOW (ref 4.22–5.81)
RDW: 14.8 % (ref 11.5–15.5)
WBC: 5.8 10*3/uL (ref 4.0–10.5)
nRBC: 0 % (ref 0.0–0.2)

## 2021-12-13 LAB — BASIC METABOLIC PANEL
Anion gap: 8 (ref 5–15)
BUN: 12 mg/dL (ref 8–23)
CO2: 25 mmol/L (ref 22–32)
Calcium: 8.4 mg/dL — ABNORMAL LOW (ref 8.9–10.3)
Chloride: 102 mmol/L (ref 98–111)
Creatinine, Ser: 1.22 mg/dL (ref 0.61–1.24)
GFR, Estimated: >60 mL/min (ref >60)
Glucose, Bld: 118 mg/dL — ABNORMAL HIGH (ref 70–99)
Potassium: 4.3 mmol/L (ref 3.5–5.1)
Sodium: 135 mmol/L (ref 135–145)

## 2021-12-13 LAB — HEPATIC FUNCTION PANEL
ALT: 12 U/L (ref 0–44)
AST: 23 U/L (ref 15–41)
Albumin: 3.9 g/dL (ref 3.5–5.0)
Alkaline Phosphatase: 62 U/L (ref 38–126)
Bilirubin, Direct: 0.1 mg/dL (ref 0.0–0.2)
Indirect Bilirubin: 0.5 mg/dL (ref 0.3–0.9)
Total Bilirubin: 0.6 mg/dL (ref 0.3–1.2)
Total Protein: 6.9 g/dL (ref 6.5–8.1)

## 2021-12-13 LAB — TROPONIN I (HIGH SENSITIVITY): Troponin I (High Sensitivity): 5 ng/L (ref <18)

## 2021-12-13 LAB — PROTIME-INR
INR: 1 (ref 0.8–1.2)
Prothrombin Time: 13.5 seconds (ref 11.4–15.2)

## 2021-12-13 LAB — LIPASE, BLOOD: Lipase: 40 U/L (ref 11–51)

## 2021-12-13 MED ORDER — IOHEXOL 350 MG/ML SOLN
75.0000 mL | Freq: Once | INTRAVENOUS | Status: AC | PRN
  Administered 2021-12-13: 75 mL via INTRAVENOUS

## 2021-12-13 MED ORDER — PREDNISONE 10 MG PO TABS: 10.0000 mg | ORAL_TABLET | Freq: Every day | ORAL | 0 refills | Status: DC

## 2021-12-13 MED ORDER — DOXYCYCLINE HYCLATE 100 MG PO TABS: 100.0000 mg | ORAL_TABLET | Freq: Two times a day (BID) | ORAL | 0 refills | Status: DC

## 2021-12-13 NOTE — ED Triage Notes (Signed)
Patient c/o right upper quadrant pain. Pain worse with movement. Reports cough for months but has imaging last Friday that was unremarkable

## 2021-12-13 NOTE — ED Provider Notes (Signed)
[facility] Provider Note    Event Date/Time   First MD Initiated Contact with Patient 12/13/21 1422     (approximate)   History   Chest Pain   HPI  [name] is a 71 y.o. male with past history of tobacco abuse, COPD, previous alcohol abuse with patient stating he only drinks about 2 alcoholic beverages per day now, hep C, hypertension, pharyngeal cancer, seizures,, OSA and chronic pain on a buprenorphine patch chronically who presents for evaluation of some acute on chronic right upper quad abdominal pain.  He states he has had this pain for 3 to 4 months.  It is slightly worse today.  He has no change in his chronic cough or any other chest pain or back pain or lower abdominal pain.  There is no nausea, vomiting or diarrhea.  No rashes.  He does not recall any injuries.  He was seen by his PCP for evaluation of the symptoms on 7/28 and had a chest x-ray done that was negative and states it is in the exact same location just a little bit worse.  It is worse when he pushes on the area but otherwise cannot identify any clear alleviating aggravating factors.    Past Medical History:  Diagnosis Date   Alcohol abuse    Benzodiazepine dependence (HCC)    Benzodiazepine withdrawal (HCC)    Chronic pain in right foot    COPD (chronic obstructive pulmonary disease) ([place])    Depression 08/19/2017   Depression    Hepatitis C    Hypertension    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[place])    Seizures ([place]) 2010   Seizures ([place])    Sleep apnea    Syncope    questionable vasovagal   Syncope      Physical Exam  Triage Vital Signs: ED Triage Vitals  Enc Vitals Group     BP 12/13/21 1355 130/66     Pulse Rate 12/13/21 1355 76     Resp 12/13/21 1355 16     Temp 12/13/21 1355 98.6 F (37 C)     Temp Source 12/13/21 1355 Oral     SpO2 12/13/21 1355 98 %     Weight 12/13/21 1351 222 lb (100.7 kg)     Height 12/13/21 1351 5' 9.5" (1.765 m)     Head Circumference  --      Peak Flow --      Pain Score 12/13/21 1351 6     Pain Loc --      Pain Edu? --      Excl. in [place]? --     Most recent vital signs: Vitals:   12/13/21 1355  BP: 130/66  Pulse: 76  Resp: 16  Temp: 98.6 F (37 C)  SpO2: 98%    General: Awake, no distress.  CV:  Good peripheral perfusion.  2+ radial pulse. Resp:  Normal effort.  Minimal end expiratory wheezing.  No respiratory distress.  No rhonchi or rales. Abd:  Distended with a very small area of focal tenderness in the right upper quadrant just under the 12th rib.  No overlying skin changes.  Abdomen is otherwise soft throughout. Other:     ED Results / Procedures / Treatments  Labs (all labs ordered are listed, but only abnormal results are displayed) Labs Reviewed  BASIC METABOLIC PANEL - Abnormal; Notable for the following components:      Result Value   Glucose, Bld 118 (*)  Calcium 8.4 (*)    All other components within normal limits  CBC - Abnormal; Notable for the following components:   RBC 4.06 (*)    Hemoglobin 12.5 (*)    HCT 38.0 (*)    Platelets 129 (*)    All other components within normal limits  PROTIME-INR  HEPATIC FUNCTION PANEL  LIPASE, BLOOD  TROPONIN I (HIGH SENSITIVITY)     EKG  EKG is remarkable for sinus rhythm with PVCs, ventricular rate of 84, left axis deviation with some nonspecific ST changes versus artifact in inferior and lateral leads.  RADIOLOGY  Right upper quadrant ultrasound my interpretation shows gallstones without evidence of cholecystitis and normal common bile duct.  I reviewed radiology interpretation and agree to findings of some increased hepatic parenchymal echotexture possibly related to fatty liver infiltration.  PROCEDURES:  Critical Care performed: No  .1-3 Lead EKG Interpretation  Performed by: Lucrezia Starch, MD Authorized by: Lucrezia Starch, MD     Interpretation: normal     ECG rate assessment: normal     Rhythm: sinus rhythm      Ectopy: none     Conduction: normal     The patient is on the cardiac monitor to evaluate for evidence of arrhythmia and/or significant heart rate changes.   MEDICATIONS ORDERED IN ED: Medications  iohexol (OMNIPAQUE) 350 MG/ML injection 75 mL (75 mLs Intravenous Contrast Given 12/13/21 1541)     IMPRESSION / MDM / ASSESSMENT AND PLAN / ED COURSE  I reviewed the triage vital signs and the nursing notes. Patient's presentation is most consistent with acute presentation with potential threat to life or bodily function.                               Differential diagnosis includes, but is not limited to PE, pneumonia, symptomatic pleural effusion, cholecystitis, otitis, possible occult rib fracture and MSK.  EKG is remarkable for sinus rhythm with PVCs, ventricular rate of 84, left axis deviation with some nonspecific ST changes versus artifact in inferior and lateral leads.  Right upper quadrant ultrasound my interpretation shows gallstones without evidence of cholecystitis and normal common bile duct.  I reviewed radiology interpretation and agree to findings of some increased hepatic parenchymal echotexture possibly related to fatty liver infiltration.  BMP without any significant lecture light or metabolic derangements.  CBC without leukocytosis and hemoglobin of 12.5 current 13.72-monthago.  Platelets are 129.  Nonelevated troponin obtained greater than 3 hours after symptom onset is not suggestive of ACS.  Lipase is 40 nonsuggestive pancreatitis.  Hepatic function panel is unremarkable without evidence of acute otitis cholestatic process.  Care patient signed over to sending provider at approximately 1530 with plan to follow-up CTA chest and reassess.  If this is negative I think patient can likely continue outpatient work-up of his chronic right upper quadrant abdominal pain.      FINAL CLINICAL IMPRESSION(S) / ED DIAGNOSES   Final diagnoses:  RUQ pain  Chronic cough  Tobacco  abuse  Calculus of gallbladder without cholecystitis without obstruction     Rx / DC Orders   ED Discharge Orders     None        Note:  This document was prepared using Dragon voice recognition software and may include unintentional dictation errors.   SLucrezia Starch MD 12/13/21 [phone]

## 2021-12-13 NOTE — ED Provider Notes (Signed)
-----------------------------------------   4:12 PM on 12/13/2021 ----------------------------------------- Patient care assumed from Dr. Tamala Julian.  Patient's work-up is overall reassuring.  Right upper quadrant ultrasound shows some gallstones but no signs of cholecystitis.  Patient CTA is negative for PE but does show bronchitis which also fits the patient's clinical presentation.  Given the patient's cough with phlegm production per patient this morning and chest pain suspect he is undergoing a degree of chest wall discomfort or intercostal strain in addition to the bronchitis.  We will place the patient on a 10-day course of doxycycline as well as a 10-day taper prednisone and have the patient follow-up with his doctor.  The remainder the patient's work-up has been very reassuring.  Patient agreeable to plan of care.   Harvest Dark, MD 12/13/21 [phone]

## 2021-12-13 NOTE — Telephone Encounter (Signed)
Pt called this morning with now severe chest/rib pain since being seen in the office on Friday, 12/07/21. Pt advised to go to the ER per Lauren.

## 2021-12-14 ENCOUNTER — Other Ambulatory Visit: Payer: Self-pay | Admitting: Physician Assistant

## 2021-12-14 ENCOUNTER — Other Ambulatory Visit: Payer: Self-pay

## 2021-12-14 DIAGNOSIS — I10 Essential (primary) hypertension: Secondary | ICD-10-CM

## 2021-12-14 MED ORDER — METHOCARBAMOL 500 MG PO TABS: 500.0000 mg | ORAL_TABLET | Freq: Three times a day (TID) | ORAL | 0 refills | Status: DC | PRN

## 2021-12-14 NOTE — Telephone Encounter (Signed)
Pt called C/O having pain under right breast and has spoke to Lauren about this pain and also pt had went to ER 12/13/21 and he has had imaging done so no broken bones.  Per Lauren we added Methocarbamol 500 mg every 8 hours PRN.  Pt informed of med sent to pharmacy

## 2021-12-18 ENCOUNTER — Encounter: Payer: Self-pay | Admitting: Internal Medicine

## 2021-12-18 ENCOUNTER — Ambulatory Visit (INDEPENDENT_AMBULATORY_CARE_PROVIDER_SITE_OTHER): Payer: Medicare HMO | Admitting: Internal Medicine

## 2021-12-18 VITALS — BP 152/68 | HR 82 | Temp 98.5°F | Resp 16 | Ht 69.0 in | Wt 221.2 lb

## 2021-12-18 DIAGNOSIS — K8010 Calculus of gallbladder with chronic cholecystitis without obstruction: Secondary | ICD-10-CM | POA: Diagnosis not present

## 2021-12-18 DIAGNOSIS — M5442 Lumbago with sciatica, left side: Secondary | ICD-10-CM

## 2021-12-18 DIAGNOSIS — J45901 Unspecified asthma with (acute) exacerbation: Secondary | ICD-10-CM

## 2021-12-18 DIAGNOSIS — R0602 Shortness of breath: Secondary | ICD-10-CM | POA: Diagnosis not present

## 2021-12-18 DIAGNOSIS — J441 Chronic obstructive pulmonary disease with (acute) exacerbation: Secondary | ICD-10-CM | POA: Diagnosis not present

## 2021-12-18 DIAGNOSIS — I700 Atherosclerosis of aorta: Secondary | ICD-10-CM | POA: Diagnosis not present

## 2021-12-18 DIAGNOSIS — M5441 Lumbago with sciatica, right side: Secondary | ICD-10-CM

## 2021-12-18 MED ORDER — IPRATROPIUM-ALBUTEROL 0.5-2.5 (3) MG/3ML IN SOLN
3.0000 mL | Freq: Once | RESPIRATORY_TRACT | Status: AC
  Administered 2021-12-18: 3 mL via RESPIRATORY_TRACT

## 2021-12-18 MED ORDER — AMITRIPTYLINE HCL 50 MG PO TABS: 50.0000 mg | ORAL_TABLET | Freq: Every day | ORAL | 2 refills | Status: DC

## 2021-12-18 MED ORDER — TIZANIDINE HCL 4 MG PO TABS: 4.0000 mg | ORAL_TABLET | Freq: Four times a day (QID) | ORAL | 1 refills | Status: DC | PRN

## 2021-12-18 MED ORDER — COMBIVENT RESPIMAT 20-100 MCG/ACT IN AERS: 1.0000 | INHALATION_SPRAY | Freq: Four times a day (QID) | RESPIRATORY_TRACT | 3 refills | Status: DC | PRN

## 2021-12-18 NOTE — Progress Notes (Addendum)
[facility] [place], [place] [address]  Internal MEDICINE  Office Visit Note  Patient Name: [name]  [id number]  [id number]  Date of Service: 12/24/2021  Chief Complaint  Patient presents with   Follow-up    12/14/21    HPI  Patient is seen today for ED follow-up. Patient has been having pain for right upper quadrant for couple months now denies any nausea or vomiting. Complains of cough and congestion since shortness of breath went to ED had a CT chest done and was treated with antibiotic and steroids. He does feel little better. CT angiogram chest is reviewed with the patient today and has multiple abnormal findings  IMPRESSION: 1. Negative examination for pulmonary embolism. 2. Diffuse bilateral bronchial wall thickening, consistent with nonspecific infectious or inflammatory bronchitis. 3. Cholelithiasis. 4. Mildly coarse contour of the liver in the included upper abdomen, suggestive of cirrhosis.   Aortic Atherosclerosis (ICD10-I70.0).  Current Medication: Outpatient Encounter Medications as of 12/18/2021  Medication Sig   amLODipine (NORVASC) 5 MG tablet TAKE 1 TABLET BY MOUTH DAILY   ammonium lactate (AMLACTIN) 12 % lotion Apply 1 application. topically as needed for dry skin.   antiseptic oral rinse (BIOTENE) LIQD 15 mLs by Mouth Rinse route as needed for dry mouth.   aspirin EC 81 MG tablet Take 81 mg by mouth daily. Swallow whole.   buprenorphine (BUTRANS) 10 MCG/HR PTWK Place 1 patch onto the skin once a week.   doxycycline (VIBRA-TABS) 100 MG tablet Take 1 tablet (100 mg total) by mouth 2 (two) times daily.   gabapentin (NEURONTIN) 600 MG tablet Take 1 tablet (600 mg total) by mouth in the morning, at noon, in the evening, and at bedtime.   hydrOXYzine (VISTARIL) 25 MG capsule TAKE 1 CAPSULE BY MOUTH EVERY 8 HOURS ASNEEDED.   magic mouthwash w/lidocaine SOLN Take 5 mLs by mouth 4 (four) times daily as needed for mouth pain.    methocarbamol (ROBAXIN) 500 MG tablet Take 1 tablet (500 mg total) by mouth every 8 (eight) hours as needed for muscle spasms.   Multiple Vitamin (MULTIVITAMIN WITH MINERALS) TABS tablet Take 1 tablet by mouth daily.   omeprazole (PRILOSEC) 40 MG capsule Take 1 capsule (40 mg total) by mouth daily.   predniSONE (DELTASONE) 10 MG tablet Take 1 tablet (10 mg total) by mouth daily. Day 1-3: take 4 tablets PO daily Day 4-6: take 3 tablets PO daily Day 7-9: take 2 tablets PO daily Day 10-12: take 1 tablet PO daily   rosuvastatin (CRESTOR) 5 MG tablet TAKE 1 TABLET BY MOUTH DAILY.   [DISCONTINUED] amitriptyline (ELAVIL) 50 MG tablet TAKE ONE TABLET BY MOUTH AT BEDTIME   [DISCONTINUED] Ipratropium-Albuterol (COMBIVENT RESPIMAT) 20-100 MCG/ACT AERS respimat Inhale 1 puff into the lungs every 6 (six) hours.   [DISCONTINUED] tiZANidine (ZANAFLEX) 4 MG tablet Take 1 tablet (4 mg total) by mouth every 6 (six) hours as needed for muscle spasms.   amitriptyline (ELAVIL) 50 MG tablet Take 1 tablet (50 mg total) by mouth at bedtime.   Ipratropium-Albuterol (COMBIVENT RESPIMAT) 20-100 MCG/ACT AERS respimat Inhale 1 puff into the lungs every 6 (six) hours as needed. Rescue inhaler   tiZANidine (ZANAFLEX) 4 MG tablet Take 1 tablet (4 mg total) by mouth every 6 (six) hours as needed for muscle spasms.   Facility-Administered Encounter Medications as of 12/18/2021  Medication   heparin lock flush 100 unit/mL   [COMPLETED] ipratropium-albuterol (DUONEB) 0.5-2.5 (3) MG/3ML nebulizer solution 3  mL    Surgical History: Past Surgical History:  Procedure Laterality Date   COLONOSCOPY WITH PROPOFOL N/A 12/16/2016   Procedure: COLONOSCOPY WITH PROPOFOL;  Surgeon: Lollie Sails, MD;  Location: [facility] ENDOSCOPY;  Service: Endoscopy;  Laterality: N/A;   COLONOSCOPY WITH PROPOFOL N/A 03/14/2017   Procedure: COLONOSCOPY WITH PROPOFOL;  Surgeon: Lollie Sails, MD;  Location: [facility] ENDOSCOPY;  Service: Endoscopy;   Laterality: N/A;   COLONOSCOPY WITH PROPOFOL     FOOT SURGERY Right 03/12/2002   FOOT SURGERY     HEMORRHOID SURGERY     LITHOTRIPSY     MYRINGOTOMY     MYRINGOTOMY WITH TUBE PLACEMENT Left 04/28/2019   Procedure: MYRINGOTOMY WITH TUBE PLACEMENT;  Surgeon: Carloyn Manner, MD;  Location: [facility];  Service: ENT;  Laterality: Left;   NASOPHARYNGOSCOPY N/A 04/28/2019   Procedure: NASOPHARYNGOSCOPY WITH BIOPSY;  Surgeon: Carloyn Manner, MD;  Location: [facility];  Service: ENT;  Laterality: N/A;   NASOPHARYNGOSCOPY     Port a cath Placement     PORTA CATH INSERTION N/A 05/20/2019   Procedure: PORTA CATH INSERTION;  Surgeon: Algernon Huxley, MD;  Location: [facility] CV LAB;  Service: Cardiovascular;  Laterality: N/A;   PORTA CATH REMOVAL N/A 11/29/2020   Procedure: PORTA CATH REMOVAL;  Surgeon: Algernon Huxley, MD;  Location: [facility];  Service: Cardiovascular;  Laterality: N/A;   TONSILLECTOMY      Medical History: Past Medical History:  Diagnosis Date   Alcohol abuse    Benzodiazepine dependence (HCC)    Benzodiazepine withdrawal (HCC)    Chronic pain in right foot    COPD (chronic obstructive pulmonary disease) ([place])    Depression 08/19/2017   Depression    Hepatitis C    Hypertension    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[place])    Seizures ([place]) 2010   Seizures ([place])    Sleep apnea    Syncope    questionable vasovagal   Syncope     Family History: Family History  Problem Relation Age of Onset   Arthritis Mother    Hypertension Mother    Cancer Father    Kidney disease Father    Alcohol abuse Paternal Aunt    Depression Maternal Grandfather     Social History   Socioeconomic History   Marital status: Divorced    Spouse name: Not on file   Number of children: Not on file   Years of education: Not on file   Highest education level: Not on file  Occupational History   Not on file  Tobacco Use   Smoking status: Every Day    Packs/day: 0.50     Years: 50.00    Total pack years: 25.00    Types: Cigarettes    Last attempt to quit: 02/05/2019    Years since quitting: 2.8   Smokeless tobacco: Never  Vaping Use   Vaping Use: Never used  Substance and Sexual Activity   Alcohol use: Yes    Alcohol/week: 14.0 standard drinks of alcohol    Types: 14 Cans of beer per week   Drug use: Not Currently   Sexual activity: Not Currently  Other Topics Concern   Not on file  Social History Narrative   ** Merged History Encounter **       Social Determinants of Health   Financial Resource Strain: Not on file  Food Insecurity: No Food Insecurity (05/27/2019)   Hunger Vital Sign    Worried About Running Out  of Food in the Last Year: Never true    [place] in the Last Year: Never true  Transportation Needs: No Transportation Needs (05/27/2019)   PRAPARE - Hydrologist (Medical): No    Lack of Transportation (Non-Medical): No  Physical Activity: Inactive (05/27/2019)   Exercise Vital Sign    Days of Exercise per Week: 0 days    Minutes of Exercise per Session: 0 min  Stress: Not on file  Social Connections: Not on file  Intimate Partner Violence: Not At Risk (05/27/2019)   Humiliation, Afraid, Rape, and Kick questionnaire    Fear of Current or Ex-Partner: No    Emotionally Abused: No    Physically Abused: No    Sexually Abused: No      Review of Systems  Constitutional:  Negative for fatigue and fever.  HENT:  Negative for congestion, mouth sores and postnasal drip.   Eyes:  Negative for redness.  Respiratory:  Positive for cough.   Cardiovascular:  Negative for chest pain.  Gastrointestinal:  Positive for abdominal pain.  Endocrine: Negative.   Genitourinary:  Negative for flank pain.  Musculoskeletal:  Positive for back pain.  Neurological: Negative.   Psychiatric/Behavioral: Negative.      Vital Signs: BP (!) 152/68   Pulse 82   Temp 98.5 F (36.9 C)   Resp 16   Ht '5\' 9"'$  (1.753 m)    Wt 221 lb 3.2 oz (100.3 kg)   SpO2 98%   BMI 32.67 kg/m    Physical Exam Constitutional:      Appearance: Normal appearance.  HENT:     Head: Normocephalic and atraumatic.     Nose: Nose normal.     Mouth/Throat:     Mouth: Mucous membranes are moist.     Pharynx: No posterior oropharyngeal erythema.  Eyes:     Extraocular Movements: Extraocular movements intact.     Pupils: Pupils are equal, round, and reactive to light.  Cardiovascular:     Pulses: Normal pulses.     Heart sounds: Normal heart sounds.  Pulmonary:     Effort: Pulmonary effort is normal.     Breath sounds: Wheezing present.  Neurological:     General: No focal deficit present.     Mental Status: He is alert.  Psychiatric:        Mood and Affect: Mood normal.        Behavior: Behavior normal.        Assessment/Plan: 1. SOB (shortness of breath) Spirometry was done here in the office ongoing symptoms of cough and sob, nebulizer treatment was also given with good relief in chest tightness - Spirometry with Graph - Pulmonary Function Test; Future - ipratropium-albuterol (DUONEB) 0.5-2.5 (3) MG/3ML nebulizer solution 3 mL  2. Acute exacerbation of COPD with asthma ([place]) We will continue to monitor his symptoms,  samples of Trelegy inhaler was also given to the patient, patient will get PFTs done and might have to see pulmonary for further recommendations - Pulmonary Function Test; Future - ipratropium-albuterol (DUONEB) 0.5-2.5 (3) MG/3ML nebulizer solution 3 mL  3. Low back pain due to bilateral sciatica Ongoing lower back pain patient followed by pain management - tiZANidine (ZANAFLEX) 4 MG tablet; Take 1 tablet (4 mg total) by mouth every 6 (six) hours as needed for muscle spasms.  Dispense: 30 tablet; Refill: 1  4. Calculus of gallbladder with chronic cholecystitis without obstruction I think his right upper quadrant pain is  still not pulmonary most likely due to chronic cholecystitis might need  a HIDA scan in future we will reevaluate  5. Aortic atherosclerosis ([place]) Patient is on Crestor 5 mg once a day we will continue to monitor  General Counseling: criag wicklund understanding of the findings of todays visit and agrees with plan of treatment. I have discussed any further diagnostic evaluation that may be needed or ordered today. We also reviewed his medications today. he has been encouraged to call the office with any questions or concerns that should arise related to todays visit.    Orders Placed This Encounter  Procedures   Spirometry with Graph   Pulmonary Function Test    Meds ordered this encounter  Medications   Ipratropium-Albuterol (COMBIVENT RESPIMAT) 20-100 MCG/ACT AERS respimat    Sig: Inhale 1 puff into the lungs every 6 (six) hours as needed. Rescue inhaler    Dispense:  4 g    Refill:  3   amitriptyline (ELAVIL) 50 MG tablet    Sig: Take 1 tablet (50 mg total) by mouth at bedtime.    Dispense:  60 tablet    Refill:  2    PLEASE SEND NEW RX PER PT REQUEST THANK YOU   tiZANidine (ZANAFLEX) 4 MG tablet    Sig: Take 1 tablet (4 mg total) by mouth every 6 (six) hours as needed for muscle spasms.    Dispense:  30 tablet    Refill:  1   ipratropium-albuterol (DUONEB) 0.5-2.5 (3) MG/3ML nebulizer solution 3 mL    Total time spent:35 Minutes Time spent includes review of chart, medications, test results, and follow up plan with the patient.   [place] Controlled Substance Database was reviewed by me.   Dr Lavera Guise Internal medicine

## 2021-12-18 NOTE — Progress Notes (Signed)
Pt given sample of Trelegy 200 mcg/62.5 inhale once daily.  I instructed pt on how to use inhaler and advised that it may be best to do in the mornings.  Advised pt to see how he does with inhaler and if ok then he can call back and let us know when the sample is completed and we can send prescription to his pharmacy.  Pt was given nebulizer treatment with Duo Neb solution in office and after completing he was examined by Dr Humphrey Rolls.  Pt advised he felt better after nebulizer breathing treatment

## 2021-12-24 MED ORDER — AMITRIPTYLINE HCL 50 MG PO TABS: 50.0000 mg | ORAL_TABLET | Freq: Every day | ORAL | 2 refills | Status: DC

## 2021-12-26 ENCOUNTER — Telehealth: Payer: Self-pay

## 2021-12-26 NOTE — Telephone Encounter (Signed)
Called and spoke to pt about a recall on tizanidine.  [facility] mailed [place] a letter for information on the recall.  Pt did advised that he received the letter also and was aware and advised that he thinks things are ok.

## 2021-12-27 ENCOUNTER — Encounter: Payer: Self-pay | Admitting: Physician Assistant

## 2021-12-27 ENCOUNTER — Ambulatory Visit: Payer: Medicare HMO | Admitting: Physician Assistant

## 2021-12-27 VITALS — BP 155/82 | HR 81 | Temp 97.8°F | Resp 16 | Ht 69.0 in | Wt 225.0 lb

## 2022-01-02 ENCOUNTER — Other Ambulatory Visit: Payer: Self-pay | Admitting: Physician Assistant

## 2022-01-02 DIAGNOSIS — F411 Generalized anxiety disorder: Secondary | ICD-10-CM

## 2022-01-02 NOTE — Telephone Encounter (Signed)
Please review  that its ok  to send

## 2022-01-08 ENCOUNTER — Other Ambulatory Visit: Payer: Self-pay | Admitting: Student in an Organized Health Care Education/Training Program

## 2022-01-14 ENCOUNTER — Other Ambulatory Visit: Payer: Self-pay | Admitting: Physician Assistant

## 2022-01-14 DIAGNOSIS — K219 Gastro-esophageal reflux disease without esophagitis: Secondary | ICD-10-CM

## 2022-01-29 ENCOUNTER — Encounter: Payer: Self-pay | Admitting: Student in an Organized Health Care Education/Training Program

## 2022-01-29 ENCOUNTER — Ambulatory Visit
Payer: Medicare HMO | Attending: Student in an Organized Health Care Education/Training Program | Admitting: Student in an Organized Health Care Education/Training Program

## 2022-01-29 VITALS — BP 157/104 | HR 98 | Temp 98.4°F | Ht 69.0 in | Wt 220.0 lb

## 2022-01-29 DIAGNOSIS — D701 Agranulocytosis secondary to cancer chemotherapy: Secondary | ICD-10-CM | POA: Diagnosis not present

## 2022-01-29 DIAGNOSIS — F321 Major depressive disorder, single episode, moderate: Secondary | ICD-10-CM | POA: Insufficient documentation

## 2022-01-29 DIAGNOSIS — G620 Drug-induced polyneuropathy: Secondary | ICD-10-CM | POA: Diagnosis not present

## 2022-01-29 DIAGNOSIS — C119 Malignant neoplasm of nasopharynx, unspecified: Secondary | ICD-10-CM | POA: Insufficient documentation

## 2022-01-29 DIAGNOSIS — T451X5A Adverse effect of antineoplastic and immunosuppressive drugs, initial encounter: Secondary | ICD-10-CM | POA: Insufficient documentation

## 2022-01-29 DIAGNOSIS — G894 Chronic pain syndrome: Secondary | ICD-10-CM | POA: Diagnosis not present

## 2022-01-29 DIAGNOSIS — R69 Illness, unspecified: Secondary | ICD-10-CM | POA: Diagnosis not present

## 2022-01-29 MED ORDER — BUPRENORPHINE 15 MCG/HR TD PTWK: 1.0000 | MEDICATED_PATCH | TRANSDERMAL | 3 refills | Status: DC

## 2022-01-29 NOTE — Progress Notes (Signed)
PROVIDER NOTE: Information contained herein reflects review and annotations entered in association with encounter. Interpretation of such information and data should be left to medically-trained personnel. Information provided to patient can be located elsewhere in the medical record under "Patient Instructions". Document created using STT-dictation technology, any transcriptional errors that may result from process are unintentional.    Patient: [name]  Service Category: E/M  Provider: Gillis Santa, MD  DOB: May 26, 1950  DOS: 01/29/2022  Specialty: Interventional Pain Management  MRN: [id number]  Setting: Ambulatory outpatient  PCP: Lavera Guise, MD  Type: Established Patient    Referring Provider: Mylinda Latina, PA*  Location: Office  Delivery: Face-to-face     HPI  [name], a 71 y.o. year old male, is here today because of his Chemotherapy-induced peripheral neuropathy ([place]) [G62.0, T45.1X5A]. 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 Pain Assessment: Severity of Chronic pain is reported as a 6 /10. Location: Foot (face pain) Right, Left/feet radiate up into the legs. Onset: More than a month ago. Quality: Discomfort, Constant, Stabbing. Timing: Constant. Modifying factor(s): nothing currently Vitals:  height is 5' 9"  (1.753 m) and weight is 220 lb (99.8 kg). His temporal temperature is 98.4 F (36.9 C). His blood pressure is 157/104 (abnormal) and his pulse is 98. His oxygen saturation is 92%.   Reason for encounter:   [name] presents today for medication management.  No significant change in his medical history other than he states that his neuropathic pain in  his feet and legs is worse.  He states that he was doing well until approximately 3 weeks ago when he started to notice more paresthesias of bilateral feet.  He finds this difficult to manage.  We discussed increasing his Butrans to 15 mcg an hour.  He is optimized on gabapentin and I do not recommend further dose escalation there.  He is currently on amitriptyline 50 mg nightly.  There is a role for dose escalation but I defer that to his primary care provider.  Consider 75 mg nightly for neuropathic pain management.  He is adherent with his patch.  He does have mild skin rash with previous patch placement.  I recommend that he utilize Eucerin or Aquaphor to the patch application site to decrease skin irritation after patch has been removed.  Patient endorsed understanding.  11/07/21 Patient follows up today for medication management.  He is endorsing analgesic and functional benefit with buprenorphine.  His dose was increased from 5 mcg to 7.5 mcg and he is endorsing approximately 40% overall pain relief with the addition of his transdermal patch.  He is not having any side effects of cognitive changes, nausea, sedation, constipation.  He utilizes a laxative when needed.  We discussed increasing his dose to 10 mcg an hour to optimize analgesic response.  We will reevaluate in 10 to 12 weeks.  08/16/2021: [name] follows up today for his second patient visit.  I reviewed his urine toxicology screen with him today which was positive for THC.  I informed the patient that we will need a clean urine toxicology screen before we are able to consider any controlled substances such as tramadol or buprenorphine.  Patient endorsed understanding.  He states that he has been  utilizing Exira which he states he will stop.  I informed him that I cannot prescribe him a controlled 2 or controlled 3 with him having an illicit substance in his urine.  Patient endorsed understanding.  We will repeat his urine toxicology screen  today and he is instructed to follow-up in 10 days to review.  HPI from initial clinic visit: 08/02/2021 [name] is a pleasant 71 year old male who presents with chronic pain related to chemotherapy induced neuropathy.  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  He describes burning and tingling along his face and in his extremities.  He has tried trigger trial in the past with limited response.  He has been seen by neurology and has been managed on various OT convulsants and neuropathic's including gabapentin which she is currently taking at 600 mg 3 times daily to 4 times daily.  He has also tried TCA with amitriptyline and is currently on 50 mg amitriptyline nightly.  He has also tried tizanidine in the past.  He is not a diabetic.  He has done physical therapy in the past.  When I asked him about his positive urine toxicology screen in 2021 for cocaine, he states that that was a lab error.   I informed the patient that his only medication option here would be buprenorphine for chronic pain management.  Pharmacotherapy Assessment  Buprenorphine increased to 15 mcg an hour  Monitoring: [place] PMP: PDMP reviewed during this encounter.         Rise Patience, RN  01/29/2022 10:53 AM  Sign when Signing Visit Nursing Pain Medication Assessment:  Safety precautions to be maintained throughout the outpatient stay will include: orient to surroundings, keep bed in low position, maintain call bell within reach at all times, provide assistance with transfer out of bed and ambulation.  Medication Inspection Compliance: Pill count conducted under aseptic conditions, in front of the patient. Neither the pills nor the bottle was removed from the patient's sight at any time. Once count was completed pills were immediately returned to the patient in their original bottle.  Medication: Buprenorphine (Suboxone) Pill/Patch Count:  4 Pill/Patch Appearance: Markings consistent  with prescribed medication Bottle Appearance: Standard pharmacy container. Clearly labeled. Filled Date: 08 / 29 / 2023 Last Medication intake:   01/22/22    UDS:  Summary  Date Value Ref Range Status  09/05/2021 Note  Final    Comment:    ==================================================================== Compliance Drug Analysis, Ur ==================================================================== Test                             Result       Flag       Units  Drug Present and Declared for Prescription Verification   Gabapentin                     PRESENT      EXPECTED   Amitriptyline                  PRESENT      EXPECTED   Nortriptyline                  PRESENT      EXPECTED    Nortriptyline is an expected metabolite of amitriptyline.    Chlorpheniramine               PRESENT      EXPECTED   Hydroxyzine  PRESENT      EXPECTED  Drug Present not Declared for Prescription Verification   Carboxy-THC                    15           UNEXPECTED ng/mg creat    Carboxy-THC is a metabolite of tetrahydrocannabinol (THC). Source of    THC is most commonly herbal marijuana or marijuana-based products,    but THC is also present in a scheduled prescription medication.    Trace amounts of THC can be present in hemp and cannabidiol (CBD)    products. This test is not intended to distinguish between delta-9-    tetrahydrocannabinol, the predominant form of THC in most herbal or    marijuana-based products, and delta-8-tetrahydrocannabinol.    Doxylamine                     PRESENT      UNEXPECTED  Drug Absent but Declared for Prescription Verification   Hydrocodone                    Not Detected UNEXPECTED ng/mg creat   Tizanidine                     Not Detected UNEXPECTED    Tizanidine, as indicated in the declared medication list, is not    always detected even when used as directed.    Salicylate                     Not Detected UNEXPECTED    Aspirin, as  indicated in the declared medication list, is not always    detected even when used as directed.  ==================================================================== Test                      Result    Flag   Units      Ref Range   Creatinine              26               mg/dL      >=20 ==================================================================== Declared Medications:  The flagging and interpretation on this report are based on the  following declared medications.  Unexpected results may arise from  inaccuracies in the declared medications.   **Note: The testing scope of this panel includes these medications:   Amitriptyline (Elavil)  Chlorpheniramine  Gabapentin (Neurontin)  Hydrocodone  Hydroxyzine (Vistaril)   **Note: The testing scope of this panel does not include small to  moderate amounts of these reported medications:   Aspirin  Tizanidine (Zanaflex)   **Note: The testing scope of this panel does not include the  following reported medications:   Amlodipine (Norvasc)  Ammonium Hydroxide (Ammonium Lactate)  Azithromycin (Zithromax)  Benzonatate (Tessalon)  Fluoride (Biotene)  Lactic Acid (Ammonium Lactate)  Mouthwash  Multivitamin  Omeprazole (Prilosec)  Rosuvastatin (Crestor) ==================================================================== For clinical consultation, please call [phone]. ====================================================================      ROS  Constitutional: Denies any fever or chills Gastrointestinal: No reported hemesis, hematochezia, vomiting, or acute GI distress Musculoskeletal: Denies any acute onset joint swelling, redness, loss of ROM, or weakness Neurological:  Lower extremity paresthesias  Medication Review  Ipratropium-Albuterol, amLODipine, amitriptyline, ammonium lactate, antiseptic oral rinse, aspirin EC, buprenorphine, doxycycline, gabapentin, hydrOXYzine, magic mouthwash w/lidocaine, methocarbamol,  multivitamin with minerals, omeprazole, rosuvastatin, and tiZANidine  History Review  Allergy: [name]  is allergic to opana [oxymorphone hcl], amoxicillin, amoxicillin, elemental sulfur, oxymorphone, oxymorphone, sulfa antibiotics, and sulfa antibiotics. Drug: [name]  reports that he does not currently use drugs. Alcohol:  reports current alcohol use of about 14.0 standard drinks of alcohol per week. Tobacco:  reports that he has been smoking cigarettes. He has a 25.00 pack-year smoking history. He has never used smokeless tobacco. Social: [name]  reports that he has been smoking cigarettes. He has a 25.00 pack-year smoking history. He has never used smokeless tobacco. He reports current alcohol use of about 14.0 standard drinks of alcohol per week. He reports that he does not currently use drugs. Medical:  has a past medical history of Alcohol abuse, Benzodiazepine dependence ([place]), Benzodiazepine withdrawal ([place]), Chronic pain in right foot, COPD (chronic obstructive pulmonary disease) ([address]), Depression (08/19/2017), Depression, Hepatitis C, Hypertension, 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[place]), Seizures ([place]) (2010), Seizures ([place]), Sleep apnea, Syncope, and Syncope. Surgical: [name]  has a past surgical history that includes Colonoscopy with propofol (N/A, 12/16/2016); Colonoscopy with propofol (N/A, 03/14/2017); Myringotomy with tube placement (Left, 04/28/2019); Nasopharyngoscopy (N/A, 04/28/2019); Foot surgery (Right, 03/12/2002); PORTA CATH INSERTION (N/A, 05/20/2019); Lithotripsy; Hemorrhoid surgery; Tonsillectomy; Colonoscopy with propofol; Myringotomy; Nasopharyngoscopy; Foot surgery; Port a cath Placement; and PORTA CATH REMOVAL (N/A, 11/29/2020). Family: family history includes Alcohol abuse in his paternal aunt; Arthritis in his mother; Cancer in his father; Depression in his maternal grandfather; Hypertension in his mother; Kidney disease in his father.  Laboratory Chemistry Profile    Renal Lab Results  Component Value Date   BUN 12 12/13/2021   CREATININE 1.22 12/13/2021   BCR 10 02/26/2018   GFRAA >60 12/11/2019   GFRNONAA >60 12/13/2021    Hepatic Lab Results  Component Value Date   AST 23 12/13/2021   ALT 12 12/13/2021   ALBUMIN 3.9 12/13/2021   ALKPHOS 62 12/13/2021   LIPASE 40 12/13/2021   AMMONIA 12 11/03/2019    Electrolytes Lab Results  Component Value Date   NA 135 12/13/2021   K 4.3 12/13/2021   CL 102 12/13/2021   CALCIUM 8.4 (L) 12/13/2021   MG 2.1 12/11/2019    Bone No results found for: "VD25OH", "VD125OH2TOT", "GQ6761PJ0", "DT2671IW5", "25OHVITD1", "25OHVITD2", "25OHVITD3", "TESTOFREE", "TESTOSTERONE"  Inflammation (CRP: Acute Phase) (ESR: Chronic Phase) No results found for: "CRP", "ESRSEDRATE", "LATICACIDVEN"       Note: Above Lab results reviewed.  Recent Imaging Review  CT Angio Chest PE W and/or Wo Contrast CLINICAL DATA:  PE suspected, chest pain  EXAM: CT ANGIOGRAPHY CHEST WITH CONTRAST  TECHNIQUE: Multidetector CT imaging of the chest was performed using the standard protocol during bolus administration of intravenous contrast. Multiplanar CT image reconstructions and MIPs were obtained to evaluate the vascular anatomy.  RADIATION DOSE REDUCTION: This exam was performed according to the departmental dose-optimization program which includes automated exposure control, adjustment of the mA and/or kV according to patient size and/or use of iterative reconstruction technique.  CONTRAST:  78m OMNIPAQUE IOHEXOL 350 MG/ML SOLN  COMPARISON:  11/22/2020  FINDINGS: Cardiovascular: Satisfactory opacification of the pulmonary arteries to the segmental level. No evidence of pulmonary embolism. Normal heart size. No pericardial effusion. Aortic atherosclerosis.  Mediastinum/Nodes: No enlarged mediastinal, hilar, or axillary lymph nodes. Thyroid gland, trachea, and esophagus demonstrate no significant  findings.  Lungs/Pleura: Diffuse bilateral bronchial wall thickening. No pleural effusion or pneumothorax.  Upper Abdomen: No acute abnormality. Rim calcified gallstone. Mildly coarse contour of the liver.  Musculoskeletal: No chest wall abnormality. No acute osseous findings.  Review of the MIP images confirms the above findings.  IMPRESSION: 1. Negative examination for pulmonary embolism. 2. Diffuse bilateral bronchial wall thickening, consistent with nonspecific infectious or inflammatory bronchitis. 3. Cholelithiasis. 4. Mildly coarse contour of the liver in the included upper abdomen, suggestive of cirrhosis.  Aortic Atherosclerosis (ICD10-I70.0).  Electronically Signed   By: Delanna Ahmadi M.D.   On: 12/13/2021 15:56 US ABDOMEN LIMITED RUQ (LIVER/GB) CLINICAL DATA:  Right upper quadrant pain intermittently for 3 months, worsening since yesterday.  EXAM: ULTRASOUND ABDOMEN LIMITED RIGHT UPPER QUADRANT  COMPARISON:  Abdominal ultrasound 12/28/2016  FINDINGS: Gallbladder:  Shadowing gallstones are seen measuring up to 1.8 cm. There is no gallbladder wall thickening or pericholecystic fluid. Sonographic Murphy's sign was reported negative.  Common bile duct:  Diameter: 6 mm  Liver:  Parenchymal echogenicity is mildly increased with loss of through transmission. There are no focal lesions. Portal vein is patent on color Doppler imaging with normal direction of blood flow towards the liver.  Other: None.  IMPRESSION: 1. Cholelithiasis without evidence of acute cholecystitis. 2. Increased hepatic parenchymal echotexture is nonspecific but can be seen with fatty infiltration.  Electronically Signed   By: Valetta Mole M.D.   On: 12/13/2021 15:30 Note: Reviewed        Physical Exam  General appearance: Well nourished, well developed, and well hydrated. In no apparent acute distress Mental status: Alert, oriented x 3 (person, place, & time)        Respiratory: No evidence of acute respiratory distress Eyes: PERLA Vitals: BP (!) 157/104   Pulse 98   Temp 98.4 F (36.9 C) (Temporal)   Ht 5' 9"  (1.753 m)   Wt 220 lb (99.8 kg)   SpO2 92%   BMI 32.49 kg/m  BMI: Estimated body mass index is 32.49 kg/m as calculated from the following:   Height as of this encounter: 5' 9"  (1.753 m).   Weight as of this encounter: 220 lb (99.8 kg). Ideal: Ideal body weight: 70.7 kg (155 lb 13.8 oz) Adjusted ideal body weight: 82.3 kg (181 lb 8.3 oz)  Assessment   Diagnosis Status  1. Chemotherapy-induced peripheral neuropathy ([place])   2. Chemotherapy induced neutropenia ([facility])   3. Depression, major, single episode, moderate ([place])   4. 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 Chronic pain syndrome    Controlled Controlled Controlled     Plan of Care   Analgesic and functional benefit with Butran, increased from 10-->15 mcg an hour and assess response after 4 months. Consider lidocaine infusion for neuropathic pain Consider titration of amitriptyline to 75 or 100 mg nightly, defer to neurolog/PCP Continue with gabapentin as prescribed, consider dose escalation to 900 mg TID- defer to neurology. Consider spinal cord stimulation (dorsal column SCS trial) for lower extremity neuropathic pain. I was very clear and direct with [name] about treatment plan, specifically as it pertain to medication management   Requested Prescriptions   Signed Prescriptions Disp Refills   buprenorphine (BUTRANS) 15 MCG/HR 4 patch 3    Sig: Place 1 patch onto the skin once a week.     Orders:  No orders of the defined types were placed in this encounter.  Follow-up plan:   Return in about 4 months (around 05/31/2022) for Medication Management, in person.    Recent Visits Date Type Provider Dept  11/07/21 Office Visit Gillis Santa, MD [facility]  Showing recent visits within past 90 days and meeting all other requirements Today's Visits Date Type  Provider  Dept  01/29/22 Office Visit Gillis Santa, MD [facility]  Showing today's visits and meeting all other requirements Future Appointments No visits were found meeting these conditions. Showing future appointments within next 90 days and meeting all other requirements  I discussed the assessment and treatment plan with the patient. The patient was provided an opportunity to ask questions and all were answered. The patient agreed with the plan and demonstrated an understanding of the instructions.  Patient advised to call back or seek an in-person evaluation if the symptoms or condition worsens.  Duration of encounter: 32mnutes.  Note by: BGillis Santa MD Date: 01/29/2022; Time: 11:09 AM

## 2022-01-29 NOTE — Progress Notes (Signed)
Nursing Pain Medication Assessment:  Safety precautions to be maintained throughout the outpatient stay will include: orient to surroundings, keep bed in low position, maintain call bell within reach at all times, provide assistance with transfer out of bed and ambulation.  Medication Inspection Compliance: Pill count conducted under aseptic conditions, in front of the patient. Neither the pills nor the bottle was removed from the patient's sight at any time. Once count was completed pills were immediately returned to the patient in their original bottle.  Medication: Buprenorphine (Suboxone) Pill/Patch Count:  4 Pill/Patch Appearance: Markings consistent with prescribed medication Bottle Appearance: Standard pharmacy container. Clearly labeled. Filled Date: 08 / 29 / 2023 Last Medication intake:   01/22/22

## 2022-01-30 ENCOUNTER — Ambulatory Visit: Payer: Medicare HMO | Admitting: Internal Medicine

## 2022-02-04 ENCOUNTER — Ambulatory Visit (INDEPENDENT_AMBULATORY_CARE_PROVIDER_SITE_OTHER): Payer: Medicare HMO | Admitting: Internal Medicine

## 2022-02-04 ENCOUNTER — Other Ambulatory Visit: Payer: Self-pay | Admitting: Internal Medicine

## 2022-02-04 ENCOUNTER — Encounter: Payer: Self-pay | Admitting: Internal Medicine

## 2022-02-04 ENCOUNTER — Other Ambulatory Visit: Payer: Self-pay | Admitting: Physician Assistant

## 2022-02-04 ENCOUNTER — Telehealth: Payer: Self-pay | Admitting: Student in an Organized Health Care Education/Training Program

## 2022-02-04 VITALS — BP 139/78 | HR 98 | Temp 98.5°F | Resp 16 | Ht 69.0 in | Wt 226.6 lb

## 2022-02-04 DIAGNOSIS — R682 Dry mouth, unspecified: Secondary | ICD-10-CM | POA: Diagnosis not present

## 2022-02-04 DIAGNOSIS — R262 Difficulty in walking, not elsewhere classified: Secondary | ICD-10-CM | POA: Diagnosis not present

## 2022-02-04 DIAGNOSIS — J438 Other emphysema: Secondary | ICD-10-CM | POA: Diagnosis not present

## 2022-02-04 DIAGNOSIS — H543 Unqualified visual loss, both eyes: Secondary | ICD-10-CM

## 2022-02-04 DIAGNOSIS — K8010 Calculus of gallbladder with chronic cholecystitis without obstruction: Secondary | ICD-10-CM

## 2022-02-04 DIAGNOSIS — M5441 Lumbago with sciatica, right side: Secondary | ICD-10-CM

## 2022-02-04 MED ORDER — TRELEGY ELLIPTA 100-62.5-25 MCG/ACT IN AEPB: 1.0000 | INHALATION_SPRAY | Freq: Every day | RESPIRATORY_TRACT | 11 refills | Status: DC

## 2022-02-04 MED ORDER — AMITRIPTYLINE HCL 75 MG PO TABS: ORAL_TABLET | ORAL | 1 refills | Status: DC

## 2022-02-04 MED ORDER — PILOCARPINE HCL 5 MG PO TABS
5.0000 mg | ORAL_TABLET | Freq: Two times a day (BID) | ORAL | 2 refills | Status: DC
Start: 2022-02-04 — End: 2022-05-28

## 2022-02-04 NOTE — Telephone Encounter (Signed)
Patient is calling about amitriptyline recommendation from Dr. Holley Raring. His doctor that has been writing it refused to change the dose and told him to ask Dr. Holley Raring for the script. Please advise patient

## 2022-02-04 NOTE — Progress Notes (Signed)
[facility] [address] [place], [place] [address]  Internal MEDICINE  Office Visit Note  Patient Name: [name]  [id number]  [id number]  Date of Service: 02/04/2022  Chief Complaint  Patient presents with   referal    Ophthalmology referral needed, needs a Rolator walker prescription    HPI Patient is here for follow-up 1.  Has diagnosis of COPD on his CT chest, did not keep his PFT appointments, thinks Trelegy is giving him oral thrush she does not want to use it but is on Combivent 2.  Patient will like to see ophthalmology due to decreased vision 3.  Patient has fair peripheral neuropathy, seen by pain management, was instructed to increase dose of Elavil to help with sleep and neuropathy symptoms he is also on gabapentin, this is duplicate therapy however patient is requesting to help with sleep 4.  Patient is requesting medication for a dry mouth after his saliva gland removal 5.  Right upper quadrant pain does have history of gallstones but does not want to do anything 6.  Patient is obtaining a rolling walker due to peripheral neuropathy and difficulty in ambulation    Current Medication: Outpatient Encounter Medications as of 02/04/2022  Medication Sig   amLODipine (NORVASC) 5 MG tablet TAKE 1 TABLET BY MOUTH DAILY   ammonium lactate (AMLACTIN) 12 % lotion Apply 1 application. topically as needed for dry skin.   antiseptic oral rinse (BIOTENE) LIQD 15 mLs by Mouth Rinse route as needed for dry mouth.   aspirin EC 81 MG tablet Take 81 mg by mouth daily. Swallow whole.   buprenorphine (BUTRANS) 15 MCG/HR Place 1 patch onto the skin once a week.   doxycycline (VIBRA-TABS) 100 MG tablet Take 1 tablet (100 mg total) by mouth 2 (two) times daily.   Fluticasone-Umeclidin-Vilant (TRELEGY ELLIPTA) 100-62.5-25 MCG/ACT AEPB Inhale 1 puff into the lungs daily.   gabapentin (NEURONTIN) 600 MG tablet Take 1 tablet (600 mg total) by mouth in the morning, at  noon, in the evening, and at bedtime.   hydrOXYzine (VISTARIL) 25 MG capsule TAKE 1 CAPSULE BY MOUTH EVERY day as needed for anxiety   Ipratropium-Albuterol (COMBIVENT RESPIMAT) 20-100 MCG/ACT AERS respimat Inhale 1 puff into the lungs every 6 (six) hours as needed. Rescue inhaler   magic mouthwash w/lidocaine SOLN Take 5 mLs by mouth 4 (four) times daily as needed for mouth pain.   methocarbamol (ROBAXIN) 500 MG tablet Take 1 tablet (500 mg total) by mouth every 8 (eight) hours as needed for muscle spasms.   Multiple Vitamin (MULTIVITAMIN WITH MINERALS) TABS tablet Take 1 tablet by mouth daily.   omeprazole (PRILOSEC) 40 MG capsule TAKE 1 CAPSULE BY MOUTH DAILY   pilocarpine (SALAGEN) 5 MG tablet Take 1 tablet (5 mg total) by mouth 2 (two) times daily.   rosuvastatin (CRESTOR) 5 MG tablet TAKE 1 TABLET BY MOUTH DAILY.   tiZANidine (ZANAFLEX) 4 MG tablet Take 1 tablet (4 mg total) by mouth every 6 (six) hours as needed for muscle spasms.   [DISCONTINUED] amitriptyline (ELAVIL) 50 MG tablet Take 1 tablet (50 mg total) by mouth at bedtime.   amitriptyline (ELAVIL) 75 MG tablet Take one tab po qhs for insomnia   Facility-Administered Encounter Medications as of 02/04/2022  Medication   heparin lock flush 100 unit/mL    Surgical History: Past Surgical History:  Procedure Laterality Date   COLONOSCOPY WITH PROPOFOL N/A 12/16/2016   Procedure: COLONOSCOPY WITH PROPOFOL;  Surgeon: Loistine Simas  U, MD;  Location: [facility] ENDOSCOPY;  Service: Endoscopy;  Laterality: N/A;   COLONOSCOPY WITH PROPOFOL N/A 03/14/2017   Procedure: COLONOSCOPY WITH PROPOFOL;  Surgeon: Lollie Sails, MD;  Location: [facility] ENDOSCOPY;  Service: Endoscopy;  Laterality: N/A;   COLONOSCOPY WITH PROPOFOL     FOOT SURGERY Right 03/12/2002   FOOT SURGERY     HEMORRHOID SURGERY     LITHOTRIPSY     MYRINGOTOMY     MYRINGOTOMY WITH TUBE PLACEMENT Left 04/28/2019   Procedure: MYRINGOTOMY WITH TUBE PLACEMENT;  Surgeon: Carloyn Manner, MD;  Location: [place];  Service: ENT;  Laterality: Left;   NASOPHARYNGOSCOPY N/A 04/28/2019   Procedure: NASOPHARYNGOSCOPY WITH BIOPSY;  Surgeon: Carloyn Manner, MD;  Location: [place];  Service: ENT;  Laterality: N/A;   NASOPHARYNGOSCOPY     Port a cath Placement     PORTA CATH INSERTION N/A 05/20/2019   Procedure: PORTA CATH INSERTION;  Surgeon: Algernon Huxley, MD;  Location: [place] CV LAB;  Service: Cardiovascular;  Laterality: N/A;   PORTA CATH REMOVAL N/A 11/29/2020   Procedure: PORTA CATH REMOVAL;  Surgeon: Algernon Huxley, MD;  Location: [place] [facility];  Service: Cardiovascular;  Laterality: N/A;   TONSILLECTOMY      Medical History: Past Medical History:  Diagnosis Date   Alcohol abuse    Benzodiazepine dependence ([place])    Benzodiazepine withdrawal ([facility])    Chronic pain in right foot    COPD (chronic obstructive pulmonary disease) ([place])    Depression 08/19/2017   Depression    Hepatitis C    Hypertension    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[place])    Seizures ([place]) 2010   Seizures ([place])    Sleep apnea    Syncope    questionable vasovagal   Syncope     Family History: Family History  Problem Relation Age of Onset   Arthritis Mother    Hypertension Mother    Cancer Father    Kidney disease Father    Alcohol abuse Paternal Aunt    Depression Maternal Grandfather     Social History   Socioeconomic History   Marital status: Divorced    Spouse name: Not on file   Number of children: Not on file   Years of education: Not on file   Highest education level: Not on file  Occupational History   Not on file  Tobacco Use   Smoking status: Every Day    Packs/day: 0.50    Years: 50.00    Total pack years: 25.00    Types: Cigarettes    Last attempt to quit: 02/05/2019    Years since quitting: 3.0   Smokeless tobacco: Never   Tobacco comments:    4-5 cigs a day (02/04/22)  Vaping Use   Vaping Use: Never used  Substance and Sexual  Activity   Alcohol use: Yes    Alcohol/week: 14.0 standard drinks of alcohol    Types: 14 Cans of beer per week   Drug use: Not Currently   Sexual activity: Not Currently  Other Topics Concern   Not on file  Social History Narrative   ** Merged History Encounter **       Social Determinants of Health   Financial Resource Strain: Not on file  Food Insecurity: No Food Insecurity (05/27/2019)   Hunger Vital Sign    Worried About Running Out of Food in the Last Year: Never true    Ran Out of Food in the Last  Year: Never true  Transportation Needs: No Transportation Needs (05/27/2019)   PRAPARE - Hydrologist (Medical): No    Lack of Transportation (Non-Medical): No  Physical Activity: Inactive (05/27/2019)   Exercise Vital Sign    Days of Exercise per Week: 0 days    Minutes of Exercise per Session: 0 min  Stress: Not on file  Social Connections: Not on file  Intimate Partner Violence: Not At Risk (05/27/2019)   Humiliation, Afraid, Rape, and Kick questionnaire    Fear of Current or Ex-Partner: No    Emotionally Abused: No    Physically Abused: No    Sexually Abused: No      Review of Systems  Constitutional:  Negative for fatigue and fever.  HENT:  Negative for congestion, mouth sores and postnasal drip.   Respiratory:  Negative for cough.   Cardiovascular:  Negative for chest pain.  Genitourinary:  Negative for flank pain.  Psychiatric/Behavioral: Negative.      Vital Signs: BP (!) 144/78   Pulse (!) 113   Temp 98.5 F (36.9 C)   Resp 16   Ht '5\' 9"'$  (1.753 m)   Wt 226 lb 9.6 oz (102.8 kg)   SpO2 98%   BMI 33.46 kg/m    Physical Exam Constitutional:      Appearance: Normal appearance.  HENT:     Head: Normocephalic and atraumatic.     Nose: Nose normal.     Mouth/Throat:     Mouth: Mucous membranes are moist.     Pharynx: No posterior oropharyngeal erythema.  Eyes:     Extraocular Movements: Extraocular movements intact.      Pupils: Pupils are equal, round, and reactive to light.  Cardiovascular:     Pulses: Normal pulses.     Heart sounds: Normal heart sounds.  Pulmonary:     Effort: Pulmonary effort is normal.     Breath sounds: Normal breath sounds.  Neurological:     General: No focal deficit present.     Mental Status: He is alert.  Psychiatric:        Mood and Affect: Mood normal.        Behavior: Behavior normal.        Assessment/Plan: 1. Difficulty walking According to patient he has difficulty walking and due to his neuropathy and would like to have a rolling walker sees pain management - For home use only DME 4 wheeled rolling walker with seat ([id number])  2. Decreased vision in both eyes Patient has been having problems with his patient will like to see ophthalmology for further evaluation and treatment - Ambulatory referral to Ophthalmology  3. Calculus of gallbladder with chronic cholecystitis without obstruction Ongoing right upper quadrant pain with gallstones patient declining to see surgery at this time  4. Dry mouth Start low-dose pilocarpine, this is also side effect of amitriptyline, and also patient has surgical resection of saliva glands, not sure if this will help however patient brought and noted that this medication might help  5. Other emphysema ([place]) Continue to take Combivent prescription for Trelegy is given today in case the patient needs that however he is reluctant to use it every day  General Counseling: Lexx verbalizes understanding of the findings of todays visit and agrees with plan of treatment. I have discussed any further diagnostic evaluation that may be needed or ordered today. We also reviewed his medications today. he has been encouraged to call the office with any questions or  concerns that should arise related to todays visit.    Orders Placed This Encounter  Procedures   For home use only DME 4 wheeled rolling walker with seat ([id number])    Ambulatory referral to Ophthalmology    Meds ordered this encounter  Medications   pilocarpine (SALAGEN) 5 MG tablet    Sig: Take 1 tablet (5 mg total) by mouth 2 (two) times daily.    Dispense:  60 tablet    Refill:  2   amitriptyline (ELAVIL) 75 MG tablet    Sig: Take one tab po qhs for insomnia    Dispense:  90 tablet    Refill:  1   Fluticasone-Umeclidin-Vilant (TRELEGY ELLIPTA) 100-62.5-25 MCG/ACT AEPB    Sig: Inhale 1 puff into the lungs daily.    Dispense:  3 each    Refill:  11    Total time spent:45 Minutes Time spent includes review of chart, medications, test results, and follow up plan with the patient.   [place] Controlled Substance Database was reviewed by me.   Dr Lavera Guise Internal medicine

## 2022-02-04 NOTE — Telephone Encounter (Signed)
Returned call to patient. No answer. LVM for him to return call.

## 2022-02-06 ENCOUNTER — Other Ambulatory Visit: Payer: Self-pay | Admitting: Internal Medicine

## 2022-02-06 ENCOUNTER — Telehealth: Payer: Self-pay | Admitting: Internal Medicine

## 2022-02-06 NOTE — Telephone Encounter (Signed)
Ophthalmology sent via [facility]

## 2022-02-07 ENCOUNTER — Encounter: Payer: Self-pay | Admitting: Oncology

## 2022-02-11 ENCOUNTER — Telehealth: Payer: Self-pay

## 2022-02-11 ENCOUNTER — Other Ambulatory Visit: Payer: Self-pay | Admitting: Physician Assistant

## 2022-02-11 DIAGNOSIS — I700 Atherosclerosis of aorta: Secondary | ICD-10-CM

## 2022-02-11 NOTE — Telephone Encounter (Signed)
Done send med

## 2022-02-15 DIAGNOSIS — M1990 Unspecified osteoarthritis, unspecified site: Secondary | ICD-10-CM | POA: Diagnosis not present

## 2022-02-27 ENCOUNTER — Telehealth: Payer: Self-pay | Admitting: Internal Medicine

## 2022-02-27 ENCOUNTER — Encounter: Payer: Self-pay | Admitting: Oncology

## 2022-02-27 NOTE — Telephone Encounter (Signed)
Signing encounter, See previous note 09/19/21

## 2022-02-27 NOTE — Telephone Encounter (Signed)
Per [facility], patient declined to schedule appointment.Wants doctor here in [place]

## 2022-03-11 ENCOUNTER — Encounter (INDEPENDENT_AMBULATORY_CARE_PROVIDER_SITE_OTHER): Payer: Self-pay

## 2022-03-12 ENCOUNTER — Telehealth: Payer: Self-pay | Admitting: Internal Medicine

## 2022-03-12 NOTE — Telephone Encounter (Signed)
Ophthalmology appointment 04/16/22 with [place]

## 2022-03-26 ENCOUNTER — Other Ambulatory Visit: Payer: Self-pay | Admitting: Internal Medicine

## 2022-03-26 DIAGNOSIS — K219 Gastro-esophageal reflux disease without esophagitis: Secondary | ICD-10-CM

## 2022-04-06 ENCOUNTER — Other Ambulatory Visit: Payer: Self-pay

## 2022-04-06 ENCOUNTER — Emergency Department
Admission: EM | Admit: 2022-04-06 | Discharge: 2022-04-06 | Disposition: A | Discharge status: Home / Self Care | Payer: Medicare HMO | Attending: Emergency Medicine | Admitting: Emergency Medicine

## 2022-04-06 ENCOUNTER — Emergency Department: Payer: Medicare HMO

## 2022-04-06 DIAGNOSIS — R051 Acute cough: Secondary | ICD-10-CM

## 2022-04-06 DIAGNOSIS — Z20822 Contact with and (suspected) exposure to covid-19: Secondary | ICD-10-CM | POA: Diagnosis not present

## 2022-04-06 DIAGNOSIS — E860 Dehydration: Secondary | ICD-10-CM | POA: Insufficient documentation

## 2022-04-06 DIAGNOSIS — R509 Fever, unspecified: Secondary | ICD-10-CM | POA: Diagnosis not present

## 2022-04-06 DIAGNOSIS — K8020 Calculus of gallbladder without cholecystitis without obstruction: Secondary | ICD-10-CM

## 2022-04-06 DIAGNOSIS — I10 Essential (primary) hypertension: Secondary | ICD-10-CM | POA: Insufficient documentation

## 2022-04-06 DIAGNOSIS — R059 Cough, unspecified: Secondary | ICD-10-CM | POA: Diagnosis not present

## 2022-04-06 DIAGNOSIS — R1011 Right upper quadrant pain: Secondary | ICD-10-CM

## 2022-04-06 DIAGNOSIS — R109 Unspecified abdominal pain: Secondary | ICD-10-CM | POA: Diagnosis not present

## 2022-04-06 DIAGNOSIS — R300 Dysuria: Secondary | ICD-10-CM | POA: Diagnosis not present

## 2022-04-06 DIAGNOSIS — R000 Tachycardia, unspecified: Secondary | ICD-10-CM | POA: Insufficient documentation

## 2022-04-06 DIAGNOSIS — R932 Abnormal findings on diagnostic imaging of liver and biliary tract: Secondary | ICD-10-CM | POA: Diagnosis not present

## 2022-04-06 LAB — BASIC METABOLIC PANEL
Anion gap: 10 (ref 5–15)
BUN: 8 mg/dL (ref 8–23)
CO2: 26 mmol/L (ref 22–32)
Calcium: 8.8 mg/dL — ABNORMAL LOW (ref 8.9–10.3)
Chloride: 101 mmol/L (ref 98–111)
Creatinine, Ser: 1.06 mg/dL (ref 0.61–1.24)
GFR, Estimated: >60 mL/min (ref >60)
Glucose, Bld: 130 mg/dL — ABNORMAL HIGH (ref 70–99)
Potassium: 4.1 mmol/L (ref 3.5–5.1)
Sodium: 137 mmol/L (ref 135–145)

## 2022-04-06 LAB — CBC WITH DIFFERENTIAL/PLATELET
Abs Immature Granulocytes: 0.03 10*3/uL (ref 0.00–0.07)
Basophils Absolute: 0 10*3/uL (ref 0.0–0.1)
Basophils Relative: 0 %
Eosinophils Absolute: 0.2 10*3/uL (ref 0.0–0.5)
Eosinophils Relative: 3 %
HCT: 38.4 % — ABNORMAL LOW (ref 39.0–52.0)
Hemoglobin: 12.8 g/dL — ABNORMAL LOW (ref 13.0–17.0)
Immature Granulocytes: 0 %
Lymphocytes Relative: 8 %
Lymphs Abs: 0.7 10*3/uL (ref 0.7–4.0)
MCH: 29.9 pg (ref 26.0–34.0)
MCHC: 33.3 g/dL (ref 30.0–36.0)
MCV: 89.7 fL (ref 80.0–100.0)
Monocytes Absolute: 0.8 10*3/uL (ref 0.1–1.0)
Monocytes Relative: 9 %
Neutro Abs: 6.7 10*3/uL (ref 1.7–7.7)
Neutrophils Relative %: 80 %
Platelets: 165 10*3/uL (ref 150–400)
RBC: 4.28 MIL/uL (ref 4.22–5.81)
RDW: 13.1 % (ref 11.5–15.5)
WBC: 8.3 10*3/uL (ref 4.0–10.5)
nRBC: 0 % (ref 0.0–0.2)

## 2022-04-06 LAB — URINALYSIS, COMPLETE (UACMP) WITH MICROSCOPIC
Bilirubin Urine: NEGATIVE
Glucose, UA: NEGATIVE mg/dL
Hgb urine dipstick: NEGATIVE
Ketones, ur: NEGATIVE mg/dL
Leukocytes,Ua: NEGATIVE
Nitrite: NEGATIVE
Protein, ur: NEGATIVE mg/dL
Specific Gravity, Urine: 1.004 — ABNORMAL LOW (ref 1.005–1.030)
Squamous Epithelial / HPF: NONE SEEN (ref 0–5)
pH: 5 (ref 5.0–8.0)

## 2022-04-06 LAB — RESP PANEL BY RT-PCR (FLU A&B, COVID) ARPGX2
Influenza A by PCR: NEGATIVE
Influenza B by PCR: NEGATIVE
SARS Coronavirus 2 by RT PCR: NEGATIVE

## 2022-04-06 LAB — LIPASE, BLOOD: Lipase: 37 U/L (ref 11–51)

## 2022-04-06 LAB — HEPATIC FUNCTION PANEL
ALT: 11 U/L (ref 0–44)
AST: 24 U/L (ref 15–41)
Albumin: 3.9 g/dL (ref 3.5–5.0)
Alkaline Phosphatase: 64 U/L (ref 38–126)
Bilirubin, Direct: <0.1 mg/dL (ref 0.0–0.2)
Total Bilirubin: 0.5 mg/dL (ref 0.3–1.2)
Total Protein: 7.1 g/dL (ref 6.5–8.1)

## 2022-04-06 LAB — SARS CORONAVIRUS 2 BY RT PCR: SARS Coronavirus 2 by RT PCR: NEGATIVE

## 2022-04-06 LAB — LACTIC ACID, PLASMA
Lactic Acid, Venous: 2.6 mmol/L (ref 0.5–1.9)
Lactic Acid, Venous: 2.1 mmol/L (ref 0.5–1.9)

## 2022-04-06 MED ORDER — GUAIFENESIN-CODEINE 100-10 MG/5ML PO SOLN: 5.0000 mL | Freq: Every day | ORAL | 0 refills | Status: AC

## 2022-04-06 MED ORDER — GUAIFENESIN-CODEINE 100-10 MG/5ML PO SOLN
5.0000 mL | Freq: Once | ORAL | Status: AC
  Administered 2022-04-06: 5 mL via ORAL
  Filled 2022-04-06: qty 5

## 2022-04-06 MED ORDER — AMOXICILLIN 500 MG PO CAPS: 500.0000 mg | ORAL_CAPSULE | Freq: Three times a day (TID) | ORAL | 0 refills | Status: AC

## 2022-04-06 MED ORDER — IBUPROFEN 600 MG PO TABS
600.0000 mg | ORAL_TABLET | Freq: Once | ORAL | Status: AC
  Administered 2022-04-06: 600 mg via ORAL
  Filled 2022-04-06: qty 1

## 2022-04-06 MED ORDER — SODIUM CHLORIDE 0.9 % IV BOLUS
1000.0000 mL | Freq: Once | INTRAVENOUS | Status: AC
  Administered 2022-04-06: 1000 mL via INTRAVENOUS

## 2022-04-06 MED ORDER — ACETAMINOPHEN 325 MG PO TABS
650.0000 mg | ORAL_TABLET | Freq: Once | ORAL | Status: AC
  Administered 2022-04-06: 650 mg via ORAL
  Filled 2022-04-06: qty 2

## 2022-04-06 NOTE — ED Notes (Signed)
ED Provider at bedside. 

## 2022-04-06 NOTE — ED Notes (Signed)
Blue top tube sent with labs.

## 2022-04-06 NOTE — ED Notes (Signed)
Reports right sided rib pain while coughing.

## 2022-04-06 NOTE — ED Triage Notes (Signed)
Pt presents via POV cough dray cough since last night. Reports vomited last night. Also reports he feels like he has a fever. Reports using inhaler he was prescribed in July without relief. Ambulatory to triage.

## 2022-04-06 NOTE — ED Provider Triage Note (Signed)
Emergency Medicine Provider Triage Evaluation Note  [name] , a 71 y.o. male  was evaluated in triage.  Pt complains of cough and chills since yesterday.  Using inhaler.   Review of Systems  Positive: +chills, cough + hx of bronchitis Negative: No vomting or diarrhea  Physical Exam  Pulse (!) 123   Temp (!) 102.4 F (39.1 C)   Resp 18   Wt 102.1 kg   SpO2 96%   BMI 33.23 kg/m  Gen:   Awake, no distress  cough+ Resp:  Normal effort, no wheezing and able to talk in complete sentences.  MSK:   Moves extremities without difficulty  Other:    Medical Decision Making  Medically screening exam initiated at 8:22 AM.  Appropriate orders placed.  [name] was informed that the remainder of the evaluation will be completed by another provider, this initial triage assessment does not replace that evaluation, and the importance of remaining in the ED until their evaluation is complete.     Johnn Hai, PA-C 04/06/22 [phone]

## 2022-04-06 NOTE — ED Notes (Signed)
Pt to ED for persistent dry cough since about 1 week, states had bronchitis for 4 months this year. States the only cough med that helped him last time was tylenol with codeine. Respirations unlabored, on RA, however pt has bilateral expiratory wheezing and lungs sound slightly wet on auscultation.

## 2022-04-06 NOTE — ED Notes (Signed)
Ultrasound at bedside. Pt requests cough medicine (with codeine). Informed EDP.

## 2022-04-06 NOTE — ED Notes (Signed)
Patient transported to X-ray 

## 2022-04-06 NOTE — Discharge Instructions (Addendum)
Take acetaminophen 650 mg and ibuprofen 400 mg every 6 hours for fever and aches and pain..  Take with food.  See your doctor this week for follow-up appointment.  Thank you for choosing [place] for your health care today!  Please see your primary doctor this week for a follow up appointment.   If you do not have a primary doctor call the following clinics to establish care:  If you have insurance:  [facility] [phone] [place] [address]   [facility]  [phone] [place] [address]   If you do not have insurance:  [facility]  [phone] [address], [place] [address]  Sometimes, in the early stages of certain disease courses it is difficult to detect in the emergency department evaluation -- so, it is important that you continue to monitor your symptoms and call your doctor right away or return to the emergency department if you develop any new or worsening symptoms.  It was my pleasure to care for you today.   Hoover Brunette Jacelyn Grip, MD

## 2022-04-06 NOTE — ED Provider Notes (Signed)
[facility] Provider Note    Event Date/Time   First MD Initiated Contact with Patient 04/06/22 0827     (approximate)   History   Cough   HPI  [name] is a 71 y.o. male   Past medical history of substance use, hypertension, hepatitis C, depression, alcohol use, seizures presents with cough for the past week, nonproductive, and fever since yesterday.  He also has dysuria.  He has nausea and vomiting and right upper quadrant pain since yesterday as well and has been previously diagnosed with gallstones.  No history of domino surgeries.  No chest pain.  History was obtained via the patient. I reviewed external medical notes including a ultrasound of the abdomen performed in August 2023 which shows gallstones but negative for cholecystitis at that time.      Physical Exam   Triage Vital Signs: ED Triage Vitals  Enc Vitals Group     BP 04/06/22 0822 (!) 190/88     Pulse Rate 04/06/22 0820 (!) 123     Resp 04/06/22 0820 18     Temp 04/06/22 0820 (!) 102.4 F (39.1 C)     Temp src --      SpO2 04/06/22 0820 96 %     Weight 04/06/22 0815 225 lb (102.1 kg)     Height --      Head Circumference --      Peak Flow --      Pain Score 04/06/22 0814 5     Pain Loc --      Pain Edu? --      Excl. in [place]? --     Most recent vital signs: Vitals:   04/06/22 1130 04/06/22 1200  BP: 130/73 (!) 146/76  Pulse: 92 87  Resp:  16  Temp:    SpO2: 91% 94%    General: Awake, no distress.  Nontoxic-appearing, dry cough, alert and oriented and pleasant.  Tachycardic low 100s and hypertensive.  He was febrile in the emergency department to 102 initially. CV:  mucus membranes are slightly dry and he is tachycardic to the low 100s Resp:  Normal effort.  Lungs clear to auscultation without focality or wheezing. Abd:  No distention.  Tenderness to the right upper quadrant without rigidity or guarding   ED Results / Procedures / Treatments    Labs (all labs ordered are listed, but only abnormal results are displayed) Labs Reviewed  BASIC METABOLIC PANEL - Abnormal; Notable for the following components:      Result Value   Glucose, Bld 130 (*)    Calcium 8.8 (*)    All other components within normal limits  CBC WITH DIFFERENTIAL/PLATELET - Abnormal; Notable for the following components:   Hemoglobin 12.8 (*)    HCT 38.4 (*)    All other components within normal limits  LACTIC ACID, PLASMA - Abnormal; Notable for the following components:   Lactic Acid, Venous 2.6 (*)    All other components within normal limits  URINALYSIS, COMPLETE (UACMP) WITH MICROSCOPIC - Abnormal; Notable for the following components:   Color, Urine STRAW (*)    APPearance CLEAR (*)    Specific Gravity, Urine 1.004 (*)    Bacteria, UA RARE (*)    All other components within normal limits  LACTIC ACID, PLASMA - Abnormal; Notable for the following components:   Lactic Acid, Venous 2.1 (*)    All other components within normal limits  SARS CORONAVIRUS 2 BY RT  PCR  RESP PANEL BY RT-PCR (FLU A&B, COVID) [id number]  CULTURE, BLOOD (ROUTINE X 2)  CULTURE, BLOOD (ROUTINE X 2)  URINE CULTURE  HEPATIC FUNCTION PANEL  LIPASE, BLOOD     I reviewed labs and they are notable for white blood cell count is 8.3, creatinine 1.06  EKG  ED ECG REPORT I, Lucillie Garfinkel, the attending physician, personally viewed and interpreted this ECG.   Date: 04/06/2022  EKG Time: 0941  Rate: 106  Rhythm: sinus tachycardia  Axis: LAD  Intervals: none  ST&T Change: no acute ischemia    RADIOLOGY I independently reviewed and interpreted Cxr and I see no obvious focality   PROCEDURES:  Critical Care performed: No  Procedures   MEDICATIONS ORDERED IN ED: Medications  acetaminophen (TYLENOL) tablet 650 mg (650 mg Oral Given 04/06/22 0823)  ibuprofen (ADVIL) tablet 600 mg (600 mg Oral Given 04/06/22 0941)  sodium chloride 0.9 % bolus 1,000 mL (0 mLs Intravenous  Stopped 04/06/22 1110)  guaiFENesin-codeine 100-10 MG/5ML solution 5 mL (5 mLs Oral Given 04/06/22 1014)  sodium chloride 0.9 % bolus 1,000 mL (1,000 mLs Intravenous New Bag/Given 04/06/22 1216)     IMPRESSION / MDM / [place] / ED COURSE  I reviewed the triage vital signs and the nursing notes.                              Differential diagnosis includes, but is not limited to, urinary tract infection, viral upper respiratory tract infection, bacterial pneumonia, cholecystitis, intra-abdominal infections, sepsis   The patient is on the cardiac monitor to evaluate for evidence of arrhythmia and/or significant heart rate changes.  MDM: Right upper quadrant ultrasound to assess for cholecystitis, negative, but he does have gallstones.  Chest x-ray is negative for bacterial pneumonia.  Viral swabs negative for COVID and flu.  Fortunately his lactic acidosis which is slightly elevated has down trended with [place] crystalloid infusion.  I suspect this was due to dehydration.  Vital signs have improved as well including afebrile now after antipyretic.  I suspect with cough and fever he has a viral upper respiratory tract infection and some mild dehydration.  He appears stable in the emergency department better with the above treatment.  No hypoxemia or respiratory distress.  I doubt sepsis.  His common bile duct was slightly enlarged but his bilirubin is normal I doubt cholangitis or other emergent biliary pathology at this time.  He was referred to Dr. Christian Mate for symptomatic cholelithiasis and discussion of elective cholecystectomy if appropriate.  He had dysuria and some rare bacteria without inflammatory cells in the urine.  Given his cough and dysuria, prescribed amoxicillin to cover for early infection.  He has no risk factors for STI and no penile discharge so I doubt that pathology.  He will be discharged at this time but was given clear instructions to return if any worsening  symptoms.  He will follow-up with PMD this week.  Patient's presentation is most consistent with acute presentation with potential threat to life or bodily function.       FINAL CLINICAL IMPRESSION(S) / ED DIAGNOSES   Final diagnoses:  Acute cough  Fever, unspecified fever cause  Dysuria  RUQ pain  Calculus of gallbladder without cholecystitis without obstruction     Rx / DC Orders   ED Discharge Orders          Ordered    guaiFENesin-codeine 100-10 MG/5ML syrup  Daily at bedtime        04/06/22 1248    amoxicillin (AMOXIL) 500 MG capsule  3 times daily        04/06/22 1248             Note:  This document was prepared using Dragon voice recognition software and may include unintentional dictation errors.    Lucillie Garfinkel, MD 04/06/22 [phone]

## 2022-04-09 LAB — URINE CULTURE: Culture: 1000 — AB

## 2022-04-10 ENCOUNTER — Other Ambulatory Visit: Payer: Self-pay

## 2022-04-10 ENCOUNTER — Encounter: Payer: Self-pay | Admitting: Nurse Practitioner

## 2022-04-10 ENCOUNTER — Ambulatory Visit (INDEPENDENT_AMBULATORY_CARE_PROVIDER_SITE_OTHER): Payer: Medicare HMO | Admitting: Nurse Practitioner

## 2022-04-10 VITALS — BP 170/80 | HR 97 | Temp 98.0°F | Resp 16 | Ht 69.0 in | Wt 224.4 lb

## 2022-04-10 DIAGNOSIS — K8010 Calculus of gallbladder with chronic cholecystitis without obstruction: Secondary | ICD-10-CM | POA: Diagnosis not present

## 2022-04-10 DIAGNOSIS — J432 Centrilobular emphysema: Secondary | ICD-10-CM | POA: Diagnosis not present

## 2022-04-10 DIAGNOSIS — Z760 Encounter for issue of repeat prescription: Secondary | ICD-10-CM

## 2022-04-10 MED ORDER — IPRATROPIUM-ALBUTEROL 0.5-2.5 (3) MG/3ML IN SOLN: 3.0000 mL | RESPIRATORY_TRACT | 2 refills | Status: DC | PRN

## 2022-04-10 MED ORDER — REVEFENACIN 175 MCG/3ML IN SOLN: 175.0000 ug | Freq: Every day | RESPIRATORY_TRACT | 2 refills | Status: DC

## 2022-04-10 MED ORDER — AMITRIPTYLINE HCL 75 MG PO TABS: ORAL_TABLET | ORAL | 1 refills | Status: DC

## 2022-04-10 NOTE — Progress Notes (Signed)
[facility] [address], [place] [address]  Internal MEDICINE  Office Visit Note  Patient Name: [name]  [id number]  [id number]  Date of Service: 04/10/2022  Chief Complaint  Patient presents with   Follow-up    ED f/u     HPI Veronica presents for a follow up visit for ED visit for difficulty breathing He was having a COPD exacerbation.  He has been using his combivent inhaler every 6 hours or more frequently.  He does not have a nebulizer machine.  Has gallstones, nonobstructive   Current Medication: Outpatient Encounter Medications as of 04/10/2022  Medication Sig   amLODipine (NORVASC) 5 MG tablet TAKE 1 TABLET BY MOUTH DAILY   ammonium lactate (AMLACTIN) 12 % lotion Apply 1 application. topically as needed for dry skin.   amoxicillin (AMOXIL) 500 MG capsule Take 1 capsule (500 mg total) by mouth 3 (three) times daily for 5 days.   antiseptic oral rinse (BIOTENE) LIQD 15 mLs by Mouth Rinse route as needed for dry mouth.   aspirin EC 81 MG tablet Take 81 mg by mouth daily. Swallow whole.   buprenorphine (BUTRANS) 15 MCG/HR Place 1 patch onto the skin once a week.   doxycycline (VIBRA-TABS) 100 MG tablet Take 1 tablet (100 mg total) by mouth 2 (two) times daily.   Fluticasone-Umeclidin-Vilant (TRELEGY ELLIPTA) 100-62.5-25 MCG/ACT AEPB Inhale 1 puff into the lungs daily.   gabapentin (NEURONTIN) 600 MG tablet TAKE 1 TABLET BY MOUTH EVERY MORNING, 1 TABLET AT NOON, 1 TABLET IN THE EVENING AND 1 TABLET AT BEDTIME AS DIRECTED. *NOTE DOSEAGE CHANGE*   guaiFENesin-codeine 100-10 MG/5ML syrup Take 5 mLs by mouth at bedtime for 4 doses.   hydrOXYzine (VISTARIL) 25 MG capsule TAKE 1 CAPSULE BY MOUTH EVERY day as needed for anxiety   Ipratropium-Albuterol (COMBIVENT RESPIMAT) 20-100 MCG/ACT AERS respimat Inhale 1 puff into the lungs every 6 (six) hours as needed. Rescue inhaler   magic mouthwash w/lidocaine SOLN Take 5 mLs by mouth 4 (four) times  daily as needed for mouth pain.   methocarbamol (ROBAXIN) 500 MG tablet Take 1 tablet (500 mg total) by mouth every 8 (eight) hours as needed for muscle spasms.   Multiple Vitamin (MULTIVITAMIN WITH MINERALS) TABS tablet Take 1 tablet by mouth daily.   omeprazole (PRILOSEC) 40 MG capsule TAKE 1 CAPSULE BY MOUTH DAILY   pilocarpine (SALAGEN) 5 MG tablet Take 1 tablet (5 mg total) by mouth 2 (two) times daily.   rosuvastatin (CRESTOR) 5 MG tablet TAKE 1 TABLET BY MOUTH DAILY.   tiZANidine (ZANAFLEX) 4 MG tablet TAKE 1 TABLET BY MOUTH EVERY 6 HOURS AS NEEDED FOR MUSCLE SPASMS   [DISCONTINUED] amitriptyline (ELAVIL) 75 MG tablet Take one tab po qhs for insomnia   [DISCONTINUED] ipratropium-albuterol (DUONEB) 0.5-2.5 (3) MG/3ML SOLN Take 3 mLs by nebulization every 4 (four) hours as needed.   [DISCONTINUED] revefenacin (YUPELRI) 175 MCG/3ML nebulizer solution Take 3 mLs (175 mcg total) by nebulization daily.   amitriptyline (ELAVIL) 75 MG tablet Take one tab po qhs for insomnia   ipratropium-albuterol (DUONEB) 0.5-2.5 (3) MG/3ML SOLN Take 3 mLs by nebulization every 4 (four) hours as needed.   revefenacin (YUPELRI) 175 MCG/3ML nebulizer solution Take 3 mLs (175 mcg total) by nebulization daily.   Facility-Administered Encounter Medications as of 04/10/2022  Medication   heparin lock flush 100 unit/mL    Surgical History: Past Surgical History:  Procedure Laterality Date   COLONOSCOPY WITH PROPOFOL N/A 12/16/2016  Procedure: COLONOSCOPY WITH PROPOFOL;  Surgeon: Lollie Sails, MD;  Location: [facility] ENDOSCOPY;  Service: Endoscopy;  Laterality: N/A;   COLONOSCOPY WITH PROPOFOL N/A 03/14/2017   Procedure: COLONOSCOPY WITH PROPOFOL;  Surgeon: Lollie Sails, MD;  Location: [facility] ENDOSCOPY;  Service: Endoscopy;  Laterality: N/A;   COLONOSCOPY WITH PROPOFOL     FOOT SURGERY Right 03/12/2002   FOOT SURGERY     HEMORRHOID SURGERY     LITHOTRIPSY     MYRINGOTOMY     MYRINGOTOMY WITH TUBE  PLACEMENT Left 04/28/2019   Procedure: MYRINGOTOMY WITH TUBE PLACEMENT;  Surgeon: Carloyn Manner, MD;  Location: [place];  Service: ENT;  Laterality: Left;   NASOPHARYNGOSCOPY N/A 04/28/2019   Procedure: NASOPHARYNGOSCOPY WITH BIOPSY;  Surgeon: Carloyn Manner, MD;  Location: [place];  Service: ENT;  Laterality: N/A;   NASOPHARYNGOSCOPY     Port a cath Placement     PORTA CATH INSERTION N/A 05/20/2019   Procedure: PORTA CATH INSERTION;  Surgeon: Algernon Huxley, MD;  Location: [place] [facility];  Service: Cardiovascular;  Laterality: N/A;   PORTA CATH REMOVAL N/A 11/29/2020   Procedure: PORTA CATH REMOVAL;  Surgeon: Algernon Huxley, MD;  Location: [place] [facility];  Service: Cardiovascular;  Laterality: N/A;   TONSILLECTOMY      Medical History: Past Medical History:  Diagnosis Date   Alcohol abuse    Benzodiazepine dependence ([place])    Benzodiazepine withdrawal ([facility])    Chronic pain in right foot    COPD (chronic obstructive pulmonary disease) ([place])    Depression 08/19/2017   Depression    Hepatitis C    Hypertension    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[place])    Seizures ([place]) 2010   Seizures ([place])    Sleep apnea    Syncope    questionable vasovagal   Syncope     Family History: Family History  Problem Relation Age of Onset   Arthritis Mother    Hypertension Mother    Cancer Father    Kidney disease Father    Alcohol abuse Paternal Aunt    Depression Maternal Grandfather     Social History   Socioeconomic History   Marital status: Divorced    Spouse name: Not on file   Number of children: Not on file   Years of education: Not on file   Highest education level: Not on file  Occupational History   Not on file  Tobacco Use   Smoking status: Former    Packs/day: 0.50    Years: 50.00    Total pack years: 25.00    Types: Cigarettes    Quit date: 02/05/2019    Years since quitting: 3.1   Smokeless tobacco: Never   Tobacco comments:    4-5 cigs a  day (02/04/22)    Quit 1 weeks ago.   Vaping Use   Vaping Use: Never used  Substance and Sexual Activity   Alcohol use: Not Currently    Alcohol/week: 14.0 standard drinks of alcohol    Types: 14 Cans of beer per week   Drug use: Not Currently   Sexual activity: Not Currently  Other Topics Concern   Not on file  Social History Narrative   ** Merged History Encounter **       Social Determinants of Health   Financial Resource Strain: Not on file  Food Insecurity: No Food Insecurity (05/27/2019)   Hunger Vital Sign    Worried About Running Out of Food in the  Last Year: Never true    [place] in the Last Year: Never true  Transportation Needs: No Transportation Needs (05/27/2019)   PRAPARE - Hydrologist (Medical): No    Lack of Transportation (Non-Medical): No  Physical Activity: Inactive (05/27/2019)   Exercise Vital Sign    Days of Exercise per Week: 0 days    Minutes of Exercise per Session: 0 min  Stress: Not on file  Social Connections: Not on file  Intimate Partner Violence: Not At Risk (05/27/2019)   Humiliation, Afraid, Rape, and Kick questionnaire    Fear of Current or Ex-Partner: No    Emotionally Abused: No    Physically Abused: No    Sexually Abused: No      Review of Systems  Constitutional:  Negative for chills, fatigue and unexpected weight change.  HENT:  Negative for congestion, postnasal drip, rhinorrhea, sneezing and sore throat.   Eyes:  Negative for redness.  Respiratory:  Positive for cough and shortness of breath. Negative for chest tightness.   Cardiovascular:  Negative for chest pain and palpitations.  Gastrointestinal:  Negative for abdominal pain, constipation, diarrhea, nausea and vomiting.  Genitourinary:  Negative for dysuria and frequency.  Musculoskeletal:  Negative for arthralgias, back pain, joint swelling and neck pain.  Skin:  Negative for rash.  Neurological: Negative.  Negative for tremors and  numbness.  Hematological:  Negative for adenopathy. Does not bruise/bleed easily.  Psychiatric/Behavioral:  Negative for behavioral problems (Depression), sleep disturbance and suicidal ideas. The patient is not nervous/anxious.     Vital Signs: BP (!) 170/80   Pulse 97   Temp 98 F (36.7 C)   Resp 16   Ht '5\' 9"'$  (1.753 m)   Wt 224 lb 6.4 oz (101.8 kg)   SpO2 93%   BMI 33.14 kg/m    Physical Exam Vitals reviewed.  Constitutional:      General: He is not in acute distress.    Appearance: Normal appearance. He is obese. He is ill-appearing.  HENT:     Head: Normocephalic and atraumatic.  Eyes:     Pupils: Pupils are equal, round, and reactive to light.  Cardiovascular:     Rate and Rhythm: Normal rate and regular rhythm.  Pulmonary:     Effort: Pulmonary effort is normal. No respiratory distress.     Breath sounds: Wheezing present.  Neurological:     Mental Status: He is alert and oriented to person, place, and time.  Psychiatric:        Mood and Affect: Mood normal.        Behavior: Behavior normal.        Assessment/Plan: 1. Centrilobular emphysema ([place]) Given nebulizer, ordered yupelri and duoneb, samples given as well.  - For home use only DME Nebulizer machine - ipratropium-albuterol (DUONEB) 0.5-2.5 (3) MG/3ML SOLN; Take 3 mLs by nebulization every 4 (four) hours as needed.  Dispense: 360 mL; Refill: 2 - revefenacin (YUPELRI) 175 MCG/3ML nebulizer solution; Take 3 mLs (175 mcg total) by nebulization daily.  Dispense: 90 mL; Refill: 2  2. Calculus of gallbladder with chronic cholecystitis without obstruction Nonobstructive, not causing issues, call clinic if having episodic RUQ pain  3. Medication refill - amitriptyline (ELAVIL) 75 MG tablet; Take one tab po qhs for insomnia  Dispense: 90 tablet; Refill: 1   General Counseling: Markhi verbalizes understanding of the findings of todays visit and agrees with plan of treatment. I have discussed any further  diagnostic evaluation that may be needed or ordered today. We also reviewed his medications today. he has been encouraged to call the office with any questions or concerns that should arise related to todays visit.    Orders Placed This Encounter  Procedures   For home use only DME Nebulizer machine    Meds ordered this encounter  Medications   DISCONTD: revefenacin (YUPELRI) 175 MCG/3ML nebulizer solution    Sig: Take 3 mLs (175 mcg total) by nebulization daily.    Dispense:  90 mL    Refill:  2   DISCONTD: ipratropium-albuterol (DUONEB) 0.5-2.5 (3) MG/3ML SOLN    Sig: Take 3 mLs by nebulization every 4 (four) hours as needed.    Dispense:  360 mL    Refill:  2   amitriptyline (ELAVIL) 75 MG tablet    Sig: Take one tab po qhs for insomnia    Dispense:  90 tablet    Refill:  1   ipratropium-albuterol (DUONEB) 0.5-2.5 (3) MG/3ML SOLN    Sig: Take 3 mLs by nebulization every 4 (four) hours as needed.    Dispense:  360 mL    Refill:  2    [facility] to [facility] [place]   revefenacin (YUPELRI) 175 MCG/3ML nebulizer solution    Sig: Take 3 mLs (175 mcg total) by nebulization daily.    Dispense:  90 mL    Refill:  2    [facility] to [facility]    Return in about 1 month (around 05/10/2022) for F/U, eval new med, [place] PCP.   Total time spent:30 Minutes Time spent includes review of chart, medications, test results, and follow up plan with the patient.   [place] Controlled Substance Database was reviewed by me.  This patient was seen by Jonetta Osgood, FNP-C in collaboration with Dr. Clayborn Bigness as a part of collaborative care agreement.   Crystelle Ferrufino R. Valetta Fuller, MSN, FNP-C Internal medicine

## 2022-04-11 DIAGNOSIS — H6980 Other specified disorders of Eustachian tube, unspecified ear: Secondary | ICD-10-CM | POA: Diagnosis not present

## 2022-04-11 DIAGNOSIS — Z8522 Personal history of malignant neoplasm of nasal cavities, middle ear, and accessory sinuses: Secondary | ICD-10-CM | POA: Diagnosis not present

## 2022-04-11 DIAGNOSIS — Z08 Encounter for follow-up examination after completed treatment for malignant neoplasm: Secondary | ICD-10-CM | POA: Diagnosis not present

## 2022-04-11 DIAGNOSIS — J342 Deviated nasal septum: Secondary | ICD-10-CM | POA: Diagnosis not present

## 2022-04-11 LAB — CULTURE, BLOOD (ROUTINE X 2)
Culture: NO GROWTH
Culture: NO GROWTH

## 2022-04-15 NOTE — Progress Notes (Unsigned)
Patient ID: [name], male   DOB: [birth year], 71 y.o.   MRN: [id number]  Chief Complaint: Gallstones  History of Present Illness [name] is a 71 y.o. male with referral for gallstones.  At one time he had some degree of right costal pain, he felt was entirely due to a bronchitic exacerbation.  He otherwise denies any postprandial abdominal pain.  He does have a history of reflux, which she reports is occasional and takes Prilosec as needed.  The gallstones have been known for a period of time.  He has a history of hepatitis.  With his other medical history, he feels that an elective procedure even as simple as a robotic cholecystectomy is not something he wishes to entertain at this time.  However he has followed through with presenting today for this discussion.  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[place])    Seizures ([place]) 2010   Sleep apnea    Syncope    questionable vasovagal      Past Surgical History:  Procedure Laterality Date   COLONOSCOPY WITH PROPOFOL N/A 12/16/2016   Procedure: COLONOSCOPY WITH PROPOFOL;  Surgeon: Lollie Sails, MD;  Location: [facility] ENDOSCOPY;  Service: Endoscopy;  Laterality: N/A;   COLONOSCOPY WITH PROPOFOL N/A 03/14/2017   Procedure: COLONOSCOPY WITH PROPOFOL;  Surgeon: Lollie Sails, MD;  Location: [facility] ENDOSCOPY;  Service: Endoscopy;  Laterality: N/A;   COLONOSCOPY WITH PROPOFOL     FOOT SURGERY Right 03/12/2002   FOOT SURGERY     HEMORRHOID SURGERY     LITHOTRIPSY     MYRINGOTOMY     MYRINGOTOMY WITH TUBE PLACEMENT Left 04/28/2019   Procedure: MYRINGOTOMY WITH TUBE PLACEMENT;  Surgeon: Carloyn Manner, MD;  Location: [place];   Service: ENT;  Laterality: Left;   NASOPHARYNGOSCOPY N/A 04/28/2019   Procedure: NASOPHARYNGOSCOPY WITH BIOPSY;  Surgeon: Carloyn Manner, MD;  Location: [place];  Service: ENT;  Laterality: N/A;   NASOPHARYNGOSCOPY     Port a cath Placement     PORTA CATH INSERTION N/A 05/20/2019   Procedure: PORTA CATH INSERTION;  Surgeon: Algernon Huxley, MD;  Location: [place] [facility];  Service: Cardiovascular;  Laterality: N/A;   PORTA CATH REMOVAL N/A 11/29/2020   Procedure: PORTA CATH REMOVAL;  Surgeon: Algernon Huxley, MD;  Location: [place] [facility];  Service: Cardiovascular;  Laterality: N/A;   TONSILLECTOMY      Allergies  Allergen Reactions   Opana [Oxymorphone Hcl]     Made him BLACKOUT   Amoxicillin Other (See Comments)   Amoxicillin    Elemental Sulfur     Childhood reaction    Oxymorphone Other (See Comments)   Oxymorphone    Sulfa Antibiotics Other (See Comments)   Sulfa Antibiotics     Current Outpatient Medications  Medication Sig Dispense Refill   amitriptyline (ELAVIL) 75 MG tablet Take one tab po qhs for insomnia 90 tablet 1   amLODipine (NORVASC) 5 MG tablet TAKE 1 TABLET BY MOUTH DAILY 90 tablet 1   ammonium lactate (AMLACTIN) 12 % lotion Apply 1 application. topically as needed for dry skin. 400 g 0   antiseptic oral  rinse (BIOTENE) LIQD 15 mLs by Mouth Rinse route as needed for dry mouth.     aspirin EC 81 MG tablet Take 81 mg by mouth daily. Swallow whole.     buprenorphine (BUTRANS) 15 MCG/HR Place 1 patch onto the skin once a week. 4 patch 3   doxycycline (VIBRA-TABS) 100 MG tablet Take 1 tablet (100 mg total) by mouth 2 (two) times daily. 20 tablet 0   Fluticasone-Umeclidin-Vilant (TRELEGY ELLIPTA) 100-62.5-25 MCG/ACT AEPB Inhale 1 puff into the lungs daily. 3 each 11   gabapentin (NEURONTIN) 600 MG tablet TAKE 1 TABLET BY MOUTH EVERY MORNING, 1 TABLET AT NOON, 1 TABLET IN THE EVENING AND 1 TABLET AT BEDTIME AS DIRECTED. *NOTE DOSEAGE CHANGE* 180 tablet  1   hydrOXYzine (VISTARIL) 25 MG capsule TAKE 1 CAPSULE BY MOUTH EVERY day as needed for anxiety 30 capsule 2   Ipratropium-Albuterol (COMBIVENT RESPIMAT) 20-100 MCG/ACT AERS respimat Inhale 1 puff into the lungs every 6 (six) hours as needed. Rescue inhaler 4 g 3   ipratropium-albuterol (DUONEB) 0.5-2.5 (3) MG/3ML SOLN Take 3 mLs by nebulization every 4 (four) hours as needed. 360 mL 2   magic mouthwash w/lidocaine SOLN Take 5 mLs by mouth 4 (four) times daily as needed for mouth pain. 240 mL 2   methocarbamol (ROBAXIN) 500 MG tablet Take 1 tablet (500 mg total) by mouth every 8 (eight) hours as needed for muscle spasms. 21 tablet 0   Multiple Vitamin (MULTIVITAMIN WITH MINERALS) TABS tablet Take 1 tablet by mouth daily.     omeprazole (PRILOSEC) 40 MG capsule TAKE 1 CAPSULE BY MOUTH DAILY 30 capsule 0   pilocarpine (SALAGEN) 5 MG tablet Take 1 tablet (5 mg total) by mouth 2 (two) times daily. 60 tablet 2   revefenacin (YUPELRI) 175 MCG/3ML nebulizer solution Take 3 mLs (175 mcg total) by nebulization daily. 90 mL 2   rosuvastatin (CRESTOR) 5 MG tablet TAKE 1 TABLET BY MOUTH DAILY. 90 tablet 3   tiZANidine (ZANAFLEX) 4 MG tablet TAKE 1 TABLET BY MOUTH EVERY 6 HOURS AS NEEDED FOR MUSCLE SPASMS 30 tablet 1   No current facility-administered medications for this visit.   Facility-Administered Medications Ordered in Other Visits  Medication Dose Route Frequency Provider Last Rate Last Admin   heparin lock flush 100 unit/mL  500 Units Intravenous Once Sindy Guadeloupe, MD        Family History Family History  Problem Relation Age of Onset   Arthritis Mother    Hypertension Mother    Cancer Father    Kidney disease Father    Alcohol abuse Paternal Aunt    Depression Maternal Grandfather       Social History Social History   Tobacco Use   Smoking status: Former    Packs/day: 0.50    Years: 50.00    Total pack years: 25.00    Types: Cigarettes    Quit date: 02/05/2019    Years since  quitting: 3.1    Passive exposure: Past   Smokeless tobacco: Never   Tobacco comments:    4-5 cigs a day (02/04/22)    Quit 1 weeks ago.   Vaping Use   Vaping Use: Never used  Substance Use Topics   Alcohol use: Not Currently    Alcohol/week: 14.0 standard drinks of alcohol    Types: 14 Cans of beer per week   Drug use: Not Currently        Review of Systems  Constitutional:  Negative for chills,  diaphoresis, fever, malaise/fatigue and weight loss.  HENT:  Negative for hearing loss, nosebleeds, sore throat and tinnitus.        History of nasopharyngeal cancer, treated with radiation and chemotherapy.  Eyes: Negative.   Respiratory:  Positive for cough, shortness of breath and wheezing. Negative for hemoptysis, sputum production and stridor.   Cardiovascular:  Positive for orthopnea and claudication. Negative for chest pain.  Gastrointestinal:  Negative for abdominal pain, blood in stool, constipation, diarrhea, heartburn, melena, nausea and vomiting.  Genitourinary:  Negative for dysuria, flank pain, frequency, hematuria and urgency.  Skin:  Negative for itching and rash.  Neurological: Negative.   Psychiatric/Behavioral: Negative.       Physical Exam Blood pressure 136/82, pulse 92, temperature 98 F (36.7 C), height '5\' 9"'$  (1.753 m), weight 226 lb (102.5 kg), SpO2 94 %. Last Weight  Most recent update: 04/16/2022 10:39 AM    Weight  102.5 kg (226 lb)             CONSTITUTIONAL: Well developed, and nourished, appropriately responsive and aware without distress.   EYES: Sclera non-icteric.   EARS, NOSE, MOUTH AND THROAT:  The oropharynx is clear. Oral mucosa is pink and moist.    Hearing is intact to voice.  NECK: Trachea is midline, and there is no jugular venous distension.  LYMPH NODES:  Lymph nodes in the neck are not appreciated. RESPIRATORY: Normal respiratory effort without pathologic use of accessory muscles. CARDIOVASCULAR: Heart is regular in rate and rhythm.   Well perfused.  GI: The abdomen is soft, nontender, and nondistended. There were no palpable masses. I did not appreciate hepatosplenomegaly. MUSCULOSKELETAL:  Symmetrical muscle tone appreciated in all four extremities.    SKIN: Skin turgor is normal. No pathologic skin lesions appreciated.  NEUROLOGIC:  Motor and sensation appear grossly normal.  Cranial nerves are grossly without defect. PSYCH:  Alert and oriented to person, place and time. Affect is appropriate for situation.  Data Reviewed I have personally reviewed what is currently available of the patient's imaging, recent labs and medical records.   Labs:     Latest Ref Rng & Units 04/06/2022    9:26 AM 12/13/2021    1:57 PM 11/07/2021   10:59 AM  CBC  WBC 4.0 - 10.5 K/uL 8.3  5.8  5.8   Hemoglobin 13.0 - 17.0 g/dL 12.8  12.5  13.4   Hematocrit 39.0 - 52.0 % 38.4  38.0  39.8   Platelets 150 - 400 K/uL 165  129  147       Latest Ref Rng & Units 04/06/2022    9:26 AM 04/06/2022    9:25 AM 12/13/2021    1:57 PM  CMP  Glucose 70 - 99 mg/dL 130   118   BUN 8 - 23 mg/dL 8   12   Creatinine 0.61 - 1.24 mg/dL 1.06   1.22   Sodium 135 - 145 mmol/L 137   135   Potassium 3.5 - 5.1 mmol/L 4.1   4.3   Chloride 98 - 111 mmol/L 101   102   CO2 22 - 32 mmol/L 26   25   Calcium 8.9 - 10.3 mg/dL 8.8   8.4   Total Protein 6.5 - 8.1 g/dL  7.1  6.9   Total Bilirubin 0.3 - 1.2 mg/dL  0.5  0.6   Alkaline Phos 38 - 126 U/L  64  62   AST 15 - 41 U/L  24  23  ALT 0 - 44 U/L  11  12       Imaging: Radiological images reviewed:  EXAM: ULTRASOUND ABDOMEN LIMITED RIGHT UPPER QUADRANT   COMPARISON:  12/12/2021   FINDINGS: Gallbladder:   There are multiple stones noted within the gallbladder measuring up to 2.1 mm. Gallbladder wall thickness is upper limits of normal measuring 2.9 mm. No pericholecystic flow and or sonographic Murphy's sign.   Common bile duct:   Diameter: 6.2 mm.  Previously 6 mm.   Liver:   The liver has a  coarsened echotexture with mild increased echogenicity. No focal abnormality identified. Portal vein is patent on color Doppler imaging with normal direction of blood flow towards the liver.   Other: None.   IMPRESSION: 1. Cholelithiasis. No secondary signs of acute cholecystitis. 2. Common bile duct measures 6.2 mm which is upper limits of normal. 3. Coarsened echotexture and mild increased echogenicity of the liver suggestive of hepatic steatosis.     Electronically Signed   By: Kerby Moors M.D.   On: 04/06/2022 10:52 CLINICAL DATA:  Right upper quadrant pain intermittently for 3 months, worsening since yesterday.   EXAM: ULTRASOUND ABDOMEN LIMITED RIGHT UPPER QUADRANT   COMPARISON:  Abdominal ultrasound 12/28/2016   FINDINGS: Gallbladder:   Shadowing gallstones are seen measuring up to 1.8 cm. There is no gallbladder wall thickening or pericholecystic fluid. Sonographic Murphy's sign was reported negative.   Common bile duct:   Diameter: 6 mm   Liver:   Parenchymal echogenicity is mildly increased with loss of through transmission. There are no focal lesions. Portal vein is patent on color Doppler imaging with normal direction of blood flow towards the liver.   Other: None.   IMPRESSION: 1. Cholelithiasis without evidence of acute cholecystitis. 2. Increased hepatic parenchymal echotexture is nonspecific but can be seen with fatty infiltration.     Electronically Signed   By: Valetta Mole M.D.   On: 12/13/2021 15:30  Assessment    I suspect probable subclinical chronic calculus cholecystitis.  However he does not admit to any symptoms at this time, or significant symptoms in the past. 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[place]) 05/11/2019   Goals of care, counseling/discussion 05/11/2019   Non-recurrent acute serous otitis media of left ear 03/22/2019   Cellulitis and abscess of head 03/02/2019   Atopic dermatitis 02/14/2019   Vitamin B12 deficiency 10/24/2018   Stomatitis and mucositis 10/24/2018   Low back pain due to bilateral sciatica 07/20/2018   Restless leg syndrome 07/20/2018   Mixed hyperlipidemia 03/03/2018   Thrush, oral 12/17/2017   Depression 08/19/2017   History of hepatitis C 08/19/2017   Syncope 08/19/2017   Gastroesophageal reflux disease without esophagitis 08/19/2017   Encounter for general adult medical examination with abnormal findings 08/06/2017   Primary insomnia 08/06/2017   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   Screening for colon cancer 08/06/2017   Dysuria 08/06/2017   Chemotherapy-induced peripheral neuropathy ([place]) 01/05/2015  Complex regional pain syndrome of both lower extremities 01/05/2015   Neuralgia 01/05/2015   Seizures ([place]) 11/29/2013   Benzodiazepine withdrawal ([place]) 11/29/2013   Seizure disorder ([place]) 11/29/2013   Alcohol abuse 11/29/2013   Essential hypertension 11/29/2013   Hepatitis C 11/29/2013   Abnormal chest x-ray 11/29/2013   Obesity 11/29/2013   Tobacco use disorder 11/29/2013    Plan    We discussed the natural progression of gallstones, and there increased likelihood of becoming more and more symptomatic with time.  We discussed the role of robotic assisted laparoscopic cholecystectomy, its outpatient nature.  And the typical degree of lack of complications associated with elective procedures rather than urgent or emergent procedures.  We discussed the risk of  potential choledocholithiasis, biliary obstruction, potential gallstone pancreatitis etc.  I believe he also understands the possibility of acute cholecystitis as well.  And considering this in light of the risk of an elective robotic cholecystectomy, he still prefers to defer elective cholecystectomy at this time.   Face-to-face time spent with the patient and accompanying care providers(if present) was 40 minutes, with more than 50% of the time spent counseling, educating, and coordinating care of the patient.    These notes generated with voice recognition software. I apologize for typographical errors.  Ronny Bacon M.D., FACS 04/16/2022, 10:40 AM

## 2022-04-16 ENCOUNTER — Telehealth: Payer: Self-pay

## 2022-04-16 ENCOUNTER — Ambulatory Visit (INDEPENDENT_AMBULATORY_CARE_PROVIDER_SITE_OTHER): Payer: Medicare HMO | Admitting: Surgery

## 2022-04-16 ENCOUNTER — Encounter: Payer: Self-pay | Admitting: Surgery

## 2022-04-16 VITALS — BP 136/82 | HR 92 | Temp 98.0°F | Ht 69.0 in | Wt 226.0 lb

## 2022-04-16 DIAGNOSIS — R051 Acute cough: Secondary | ICD-10-CM

## 2022-04-16 DIAGNOSIS — K8020 Calculus of gallbladder without cholecystitis without obstruction: Secondary | ICD-10-CM | POA: Insufficient documentation

## 2022-04-16 MED ORDER — HYDROCOD POLI-CHLORPHE POLI ER 10-8 MG/5ML PO SUER: 5.0000 mL | Freq: Two times a day (BID) | ORAL | 0 refills | Status: DC | PRN

## 2022-04-16 NOTE — Patient Instructions (Signed)
We have spoken today about what is involved in having your gallbladder removed. If you decide you would like to have this done please call our office and we will get you set up for this. Depending on how long it has been since we saw you, you may need to come in to be seen by the surgeon prior.     Laparoscopic Cholecystectomy Laparoscopic cholecystectomy is surgery to remove the gallbladder. The gallbladder is located in the upper right part of the abdomen, behind the liver. It is a storage sac for bile, which is produced in the liver. Bile aids in the digestion and absorption of fats. Cholecystectomy is often done for inflammation of the gallbladder (cholecystitis). This condition is usually caused by a buildup of gallstones (cholelithiasis) in the gallbladder. Gallstones can block the flow of bile, and that can result in inflammation and pain. In severe cases, emergency surgery may be required. If emergency surgery is not required, you will have time to prepare for the procedure. Laparoscopic surgery is an alternative to open surgery. Laparoscopic surgery has a shorter recovery time. Your common bile duct may also need to be examined during the procedure. If stones are found in the common bile duct, they may be removed. LET [facility] CARE PROVIDER KNOW ABOUT: Any allergies you have. All medicines you are taking, including vitamins, herbs, eye drops, creams, and over-the-counter medicines. Previous problems you or members of your family have had with the use of anesthetics. Any blood disorders you have. Previous surgeries you have had.  Any medical conditions you have. RISKS AND COMPLICATIONS Generally, this is a safe procedure. However, problems may occur, including: Infection. Bleeding. Allergic reactions to medicines. Damage to other structures or organs. A stone remaining in the common bile duct. A bile leak from the cyst duct that is clipped when your gallbladder is removed. The need  to convert to open surgery, which requires a larger incision in the abdomen. This may be necessary if your surgeon thinks that it is not safe to continue with a laparoscopic procedure. BEFORE THE PROCEDURE Ask your health care provider about: Changing or stopping your regular medicines. This is especially important if you are taking diabetes medicines or blood thinners. Taking medicines such as aspirin and ibuprofen. These medicines can thin your blood. Do not take these medicines before your procedure if your health care provider instructs you not to. Follow instructions from your health care provider about eating or drinking restrictions. Let your health care provider know if you develop a cold or an infection before surgery. Plan to have someone take you home after the procedure. Ask your health care provider how your surgical site will be marked or identified. You may be given antibiotic medicine to help prevent infection. PROCEDURE To reduce your risk of infection: Your health care team will wash or sanitize their hands. Your skin will be washed with soap. An IV tube may be inserted into one of your veins. You will be given a medicine to make you fall asleep (general anesthetic). A breathing tube will be placed in your mouth. The surgeon will make several small cuts (incisions) in your abdomen. A thin, lighted tube (laparoscope) that has a tiny camera on the end will be inserted through one of the small incisions. The camera on the laparoscope will send a picture to a TV screen (monitor) in the operating room. This will give the surgeon a good view inside your abdomen. A gas will be pumped into your  abdomen. This will expand your abdomen to give the surgeon more room to perform the surgery. Other tools that are needed for the procedure will be inserted through the other incisions. The gallbladder will be removed through one of the incisions. After your gallbladder has been removed, the  incisions will be closed with stitches (sutures), staples, or skin glue. Your incisions may be covered with a bandage (dressing). The procedure may vary among health care providers and hospitals. AFTER THE PROCEDURE Your blood pressure, heart rate, breathing rate, and blood oxygen level will be monitored often until the medicines you were given have worn off. You will be given medicines as needed to control your pain.   This information is not intended to replace advice given to you by your health care provider. Make sure you discuss any questions you have with your health care provider.   Document Released: 04/29/2005 Document Revised: 01/18/2015 Document Reviewed: 12/09/2012 Elsevier Interactive Patient Education 2016 [place] Diet for Gallbladder Conditions A low-fat diet can be helpful if you have pancreatitis or a gallbladder condition. With these conditions, your pancreas and gallbladder have trouble digesting fats. A healthy eating plan with less fat will help rest your pancreas and gallbladder and reduce your symptoms. WHAT DO I NEED TO KNOW ABOUT THIS DIET? Eat a low-fat diet. Reduce your fat intake to less than 20-30% of your total daily calories. This is less than 50-60 g of fat per day. Remember that you need some fat in your diet. Ask your dietician what your daily goal should be. Choose nonfat and low-fat healthy foods. Look for the words "nonfat," "low fat," or "fat free." As a guide, look on the label and choose foods with less than 3 g of fat per serving. Eat only one serving. Avoid alcohol. Do not smoke. If you need help quitting, talk with your health care provider. Eat small frequent meals instead of three large heavy meals. WHAT FOODS CAN I EAT? Grains Include healthy grains and starches such as potatoes, wheat bread, fiber-rich cereal, and brown rice. Choose whole grain options whenever possible. In adults, whole grains should account for 45-65% of your  daily calories.  Fruits and Vegetables Eat plenty of fruits and vegetables. Fresh fruits and vegetables add fiber to your diet. Meats and Other Protein Sources Eat lean meat such as chicken and pork. Trim any fat off of meat before cooking it. Eggs, fish, and beans are other sources of protein. In adults, these foods should account for 10-35% of your daily calories. Dairy Choose low-fat milk and dairy options. Dairy includes fat and protein, as well as calcium.  Fats and Oils Limit high-fat foods such as fried foods, sweets, baked goods, sugary drinks.  Other Creamy sauces and condiments, such as mayonnaise, can add extra fat. Think about whether or not you need to use them, or use smaller amounts or low fat options. WHAT FOODS ARE NOT RECOMMENDED? High fat foods, such as: [employer]. Ice cream. [place] toast. Sweet rolls. Pizza. Cheese bread. Foods covered with batter, butter, creamy sauces, or cheese. Fried foods. Sugary drinks and desserts. Foods that cause gas or bloating   This information is not intended to replace advice given to you by your health care provider. Make sure you discuss any questions you have with your health care provider.   Document Released: 05/04/2013 Document Reviewed: 05/04/2013 Elsevier Interactive Patient Education [employer].

## 2022-04-16 NOTE — Telephone Encounter (Signed)
Med sent to pharmacy.

## 2022-04-18 ENCOUNTER — Other Ambulatory Visit: Payer: Self-pay

## 2022-04-18 DIAGNOSIS — J449 Chronic obstructive pulmonary disease, unspecified: Secondary | ICD-10-CM | POA: Diagnosis not present

## 2022-04-18 DIAGNOSIS — J432 Centrilobular emphysema: Secondary | ICD-10-CM

## 2022-04-18 MED ORDER — IPRATROPIUM-ALBUTEROL 0.5-2.5 (3) MG/3ML IN SOLN: 3.0000 mL | RESPIRATORY_TRACT | 2 refills | Status: DC | PRN

## 2022-04-18 MED ORDER — REVEFENACIN 175 MCG/3ML IN SOLN: 175.0000 ug | Freq: Every day | RESPIRATORY_TRACT | 2 refills | Status: DC

## 2022-05-03 ENCOUNTER — Observation Stay
Admission: EM | Admit: 2022-05-03 | Discharge: 2022-05-04 | Disposition: A | Discharge status: Home Health | Payer: Medicare HMO | Attending: Internal Medicine | Admitting: Internal Medicine

## 2022-05-03 ENCOUNTER — Observation Stay: Payer: Medicare HMO

## 2022-05-03 ENCOUNTER — Other Ambulatory Visit: Payer: Self-pay

## 2022-05-03 ENCOUNTER — Emergency Department: Payer: Medicare HMO

## 2022-05-03 DIAGNOSIS — Z7982 Long term (current) use of aspirin: Secondary | ICD-10-CM | POA: Insufficient documentation

## 2022-05-03 DIAGNOSIS — G40909 Epilepsy, unspecified, not intractable, without status epilepticus: Secondary | ICD-10-CM

## 2022-05-03 DIAGNOSIS — R739 Hyperglycemia, unspecified: Secondary | ICD-10-CM | POA: Diagnosis not present

## 2022-05-03 DIAGNOSIS — J9611 Chronic respiratory failure with hypoxia: Secondary | ICD-10-CM | POA: Diagnosis not present

## 2022-05-03 DIAGNOSIS — I10 Essential (primary) hypertension: Secondary | ICD-10-CM | POA: Diagnosis present

## 2022-05-03 DIAGNOSIS — Z743 Need for continuous supervision: Secondary | ICD-10-CM | POA: Diagnosis not present

## 2022-05-03 DIAGNOSIS — R4701 Aphasia: Principal | ICD-10-CM | POA: Diagnosis present

## 2022-05-03 DIAGNOSIS — R4182 Altered mental status, unspecified: Secondary | ICD-10-CM | POA: Insufficient documentation

## 2022-05-03 DIAGNOSIS — I6522 Occlusion and stenosis of left carotid artery: Secondary | ICD-10-CM | POA: Diagnosis not present

## 2022-05-03 DIAGNOSIS — Z87891 Personal history of nicotine dependence: Secondary | ICD-10-CM | POA: Diagnosis not present

## 2022-05-03 DIAGNOSIS — R4781 Slurred speech: Secondary | ICD-10-CM | POA: Diagnosis not present

## 2022-05-03 DIAGNOSIS — J449 Chronic obstructive pulmonary disease, unspecified: Secondary | ICD-10-CM | POA: Diagnosis not present

## 2022-05-03 DIAGNOSIS — G9340 Encephalopathy, unspecified: Secondary | ICD-10-CM | POA: Diagnosis present

## 2022-05-03 DIAGNOSIS — Z79899 Other long term (current) drug therapy: Secondary | ICD-10-CM | POA: Diagnosis not present

## 2022-05-03 DIAGNOSIS — J441 Chronic obstructive pulmonary disease with (acute) exacerbation: Secondary | ICD-10-CM | POA: Diagnosis present

## 2022-05-03 DIAGNOSIS — R400 Somnolence: Secondary | ICD-10-CM | POA: Diagnosis not present

## 2022-05-03 DIAGNOSIS — R69 Illness, unspecified: Secondary | ICD-10-CM | POA: Diagnosis not present

## 2022-05-03 DIAGNOSIS — E119 Type 2 diabetes mellitus without complications: Secondary | ICD-10-CM | POA: Insufficient documentation

## 2022-05-03 DIAGNOSIS — F0390 Unspecified dementia without behavioral disturbance: Secondary | ICD-10-CM | POA: Diagnosis not present

## 2022-05-03 DIAGNOSIS — G319 Degenerative disease of nervous system, unspecified: Secondary | ICD-10-CM | POA: Diagnosis not present

## 2022-05-03 LAB — CBC WITH DIFFERENTIAL/PLATELET
Abs Immature Granulocytes: 0.02 10*3/uL (ref 0.00–0.07)
Basophils Absolute: 0 10*3/uL (ref 0.0–0.1)
Basophils Relative: 0 %
Eosinophils Absolute: 0.3 10*3/uL (ref 0.0–0.5)
Eosinophils Relative: 4 %
HCT: 37.4 % — ABNORMAL LOW (ref 39.0–52.0)
Hemoglobin: 12.1 g/dL — ABNORMAL LOW (ref 13.0–17.0)
Immature Granulocytes: 0 %
Lymphocytes Relative: 13 %
Lymphs Abs: 1 10*3/uL (ref 0.7–4.0)
MCH: 29.9 pg (ref 26.0–34.0)
MCHC: 32.4 g/dL (ref 30.0–36.0)
MCV: 92.3 fL (ref 80.0–100.0)
Monocytes Absolute: 0.7 10*3/uL (ref 0.1–1.0)
Monocytes Relative: 9 %
Neutro Abs: 5.2 10*3/uL (ref 1.7–7.7)
Neutrophils Relative %: 74 %
Platelets: 162 10*3/uL (ref 150–400)
RBC: 4.05 MIL/uL — ABNORMAL LOW (ref 4.22–5.81)
RDW: 13.7 % (ref 11.5–15.5)
WBC: 7.1 10*3/uL (ref 4.0–10.5)
nRBC: 0 % (ref 0.0–0.2)

## 2022-05-03 LAB — URINALYSIS, ROUTINE W REFLEX MICROSCOPIC
Bilirubin Urine: NEGATIVE
Glucose, UA: NEGATIVE mg/dL
Hgb urine dipstick: NEGATIVE
Ketones, ur: NEGATIVE mg/dL
Leukocytes,Ua: NEGATIVE
Nitrite: NEGATIVE
Protein, ur: NEGATIVE mg/dL
Specific Gravity, Urine: 1.012 (ref 1.005–1.030)
pH: 7 (ref 5.0–8.0)

## 2022-05-03 LAB — TROPONIN I (HIGH SENSITIVITY)
Troponin I (High Sensitivity): 4 ng/L (ref <18)
Troponin I (High Sensitivity): 5 ng/L (ref <18)

## 2022-05-03 LAB — URINE DRUG SCREEN, QUALITATIVE (ARMC ONLY)
Amphetamines, Ur Screen: NOT DETECTED
Barbiturates, Ur Screen: NOT DETECTED
Benzodiazepine, Ur Scrn: NOT DETECTED
Cannabinoid 50 Ng, Ur ~~LOC~~: NOT DETECTED
Cocaine Metabolite,Ur ~~LOC~~: NOT DETECTED
MDMA (Ecstasy)Ur Screen: NOT DETECTED
Methadone Scn, Ur: NOT DETECTED
Opiate, Ur Screen: NOT DETECTED
Phencyclidine (PCP) Ur S: NOT DETECTED
Tricyclic, Ur Screen: POSITIVE — AB

## 2022-05-03 LAB — ETHANOL: Alcohol, Ethyl (B): <10 mg/dL (ref <10)

## 2022-05-03 LAB — BASIC METABOLIC PANEL
Anion gap: 10 (ref 5–15)
BUN: 15 mg/dL (ref 8–23)
CO2: 28 mmol/L (ref 22–32)
Calcium: 8.7 mg/dL — ABNORMAL LOW (ref 8.9–10.3)
Chloride: 101 mmol/L (ref 98–111)
Creatinine, Ser: 1.28 mg/dL — ABNORMAL HIGH (ref 0.61–1.24)
GFR, Estimated: >60 mL/min (ref >60)
Glucose, Bld: 107 mg/dL — ABNORMAL HIGH (ref 70–99)
Potassium: 4.9 mmol/L (ref 3.5–5.1)
Sodium: 139 mmol/L (ref 135–145)

## 2022-05-03 LAB — LACTIC ACID, PLASMA
Lactic Acid, Venous: 0.7 mmol/L (ref 0.5–1.9)
Lactic Acid, Venous: 0.9 mmol/L (ref 0.5–1.9)

## 2022-05-03 LAB — AMMONIA: Ammonia: 11 umol/L (ref 9–35)

## 2022-05-03 LAB — CK: Total CK: 59 U/L (ref 49–397)

## 2022-05-03 LAB — MAGNESIUM: Magnesium: 2.5 mg/dL — ABNORMAL HIGH (ref 1.7–2.4)

## 2022-05-03 MED ORDER — STROKE: EARLY STAGES OF RECOVERY BOOK: Freq: Once | Status: DC

## 2022-05-03 MED ORDER — CLOPIDOGREL BISULFATE 75 MG PO TABS
75.0000 mg | ORAL_TABLET | Freq: Every day | ORAL | Status: DC
  Administered 2022-05-04: 75 mg via ORAL
  Filled 2022-05-03: qty 1

## 2022-05-03 MED ORDER — ASPIRIN 300 MG RE SUPP: 300.0000 mg | Freq: Every day | RECTAL | Status: DC

## 2022-05-03 MED ORDER — ENOXAPARIN SODIUM 60 MG/0.6ML IJ SOSY: 0.5000 mg/kg | PREFILLED_SYRINGE | INTRAMUSCULAR | Status: DC

## 2022-05-03 MED ORDER — ATORVASTATIN CALCIUM 20 MG PO TABS
80.0000 mg | ORAL_TABLET | Freq: Every day | ORAL | Status: DC
  Administered 2022-05-04: 80 mg via ORAL
  Filled 2022-05-03: qty 4

## 2022-05-03 MED ORDER — ASPIRIN 81 MG PO CHEW
81.0000 mg | CHEWABLE_TABLET | Freq: Every day | ORAL | Status: DC
  Administered 2022-05-04: 81 mg via ORAL
  Filled 2022-05-03: qty 1

## 2022-05-03 MED ORDER — GADOBUTROL 1 MMOL/ML IV SOLN
10.0000 mL | Freq: Once | INTRAVENOUS | Status: AC | PRN
  Administered 2022-05-03: 10 mL via INTRAVENOUS

## 2022-05-03 NOTE — ED Triage Notes (Addendum)
Patient has significant slurred speech and aphasia.  Unable to read sentences and having trouble finding his words.  Patient thinks the president is Merrilyn Puma but can tell you the year month and next holiday.  Patient is very jerky in extremities. No facial droop.  Unsure of last known well bc patient states he went to bed at midnight but neighbor is unsure and patient states "he is fine and has been watching tv".  Also states "I get like this when I have these attacks but doesn't know what they are called. Patient reports he fell last night.  Hx of substance abuse hep C

## 2022-05-03 NOTE — ED Triage Notes (Signed)
Pt to ED via [facility] from home. EMS called by friend due to pt starting to have slurred speech. Stroke screen negative with EMS. Pt on gabapentin and has pain patch on that has been removed. Pupils constricted. CBG 257 and is not known diabetic. Unsure if on blood thinners. Pt poor historian. Unknown LKW

## 2022-05-03 NOTE — H&P (Addendum)
History and Physical    Patient: [name] YNW:[id number] DOB: [birth year] DOA: 05/03/2022 DOS: the patient was seen and examined on 05/03/2022 PCP: Lavera Guise, MD  Patient coming from: Home  Chief Complaint:  Chief Complaint  Patient presents with   Neurologic Problem   HPI: [name] is a 71 y.o. male seems to be in his usual state of health till yesterday.  History from the patient is difficult to obtain as the patient claims that he has been in the ER already for 24 hours.  Therefore he is clearly not oriented.  Regardless it seems that the patient's living by himself.  His neighbors had visited today and realized that the patient was not speaking appropriately and they convinced him to call an ambulance and come to the ER.  There is no report of the patient passing out having tremors falling without having focal weakness having incontinence any back pain.  Patient does not report having similar symptoms in the past  Patient currently reports feeling pretty good.  And feels that his speech is back to normal Review of Systems: As mentioned in the history of present illness. All other systems reviewed and are negative. 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[place])    Seizures ([place]) 2010   Sleep apnea    Syncope    questionable vasovagal   Past Surgical History:  Procedure Laterality Date   COLONOSCOPY WITH PROPOFOL N/A 12/16/2016   Procedure: COLONOSCOPY WITH PROPOFOL;  Surgeon: Lollie Sails, MD;  Location: [facility] ENDOSCOPY;  Service: Endoscopy;  Laterality: N/A;   COLONOSCOPY WITH PROPOFOL N/A 03/14/2017   Procedure: COLONOSCOPY WITH PROPOFOL;  Surgeon: Lollie Sails, MD;  Location: [facility]  ENDOSCOPY;  Service: Endoscopy;  Laterality: N/A;   COLONOSCOPY WITH PROPOFOL     FOOT SURGERY Right 03/12/2002   FOOT SURGERY     HEMORRHOID SURGERY     LITHOTRIPSY     MYRINGOTOMY     MYRINGOTOMY WITH TUBE PLACEMENT Left 04/28/2019   Procedure: MYRINGOTOMY WITH TUBE PLACEMENT;  Surgeon: Carloyn Manner, MD;  Location: [place];  Service: ENT;  Laterality: Left;   NASOPHARYNGOSCOPY N/A 04/28/2019   Procedure: NASOPHARYNGOSCOPY WITH BIOPSY;  Surgeon: Carloyn Manner, MD;  Location: [place];  Service: ENT;  Laterality: N/A;   NASOPHARYNGOSCOPY     Port a cath Placement     PORTA CATH INSERTION N/A 05/20/2019   Procedure: PORTA CATH INSERTION;  Surgeon: Algernon Huxley, MD;  Location: [place] CV LAB;  Service: Cardiovascular;  Laterality: N/A;   PORTA CATH REMOVAL N/A 11/29/2020   Procedure: PORTA CATH REMOVAL;  Surgeon: Algernon Huxley, MD;  Location: [place] [facility];  Service: Cardiovascular;  Laterality: N/A;   TONSILLECTOMY     Social History:  reports that he quit smoking about 3 years ago. His smoking use included cigarettes. He has a 25.00 pack-year smoking history. He has been exposed to tobacco smoke. He has never used smokeless tobacco. He reports that he does not currently use alcohol after a past usage of about 14.0 standard drinks of alcohol per week. He reports that he does not currently use drugs.  Allergies  Allergen Reactions   Opana [Oxymorphone Hcl]     Made him BLACKOUT   Elemental Sulfur     Childhood reaction    Oxymorphone Other (See Comments)   Oxymorphone    Sulfa Antibiotics Other (See Comments)   Sulfa Antibiotics     Family History  Problem Relation Age of Onset   Arthritis Mother    Hypertension Mother    Cancer Father    Kidney disease Father    Alcohol abuse Paternal Aunt    Depression Maternal Grandfather     Prior to Admission medications   Medication Sig Start Date End Date Taking? Authorizing Provider   amitriptyline (ELAVIL) 75 MG tablet Take one tab po qhs for insomnia 04/10/22   Jonetta Osgood, NP  amLODipine (NORVASC) 5 MG tablet TAKE 1 TABLET BY MOUTH DAILY 12/14/21   McDonough, Lauren K, PA-C  ammonium lactate (AMLACTIN) 12 % lotion Apply 1 application. topically as needed for dry skin. 07/19/21   Felipa Furnace, DPM  antiseptic oral rinse (BIOTENE) LIQD 15 mLs by Mouth Rinse route as needed for dry mouth.    [provider]  aspirin EC 81 MG tablet Take 81 mg by mouth daily. Swallow whole.    [provider]  buprenorphine (BUTRANS) 15 MCG/HR Place 1 patch onto the skin once a week. 01/29/22 05/21/22  Gillis Santa, MD  chlorpheniramine-HYDROcodone (TUSSIONEX) 10-8 MG/5ML Take 5 mLs by mouth every 12 (twelve) hours as needed for cough. 04/16/22   Jonetta Osgood, NP  Fluticasone-Umeclidin-Vilant (TRELEGY ELLIPTA) 100-62.5-25 MCG/ACT AEPB Inhale 1 puff into the lungs daily. 02/04/22   Lavera Guise, MD  gabapentin (NEURONTIN) 600 MG tablet TAKE 1 TABLET BY MOUTH EVERY MORNING, 1 TABLET AT NOON, 1 TABLET IN THE EVENING AND 1 TABLET AT BEDTIME AS DIRECTED. *NOTE DOSEAGE CHANGE* 02/07/22   Ventura Sellers, MD  hydrOXYzine (VISTARIL) 25 MG capsule TAKE 1 CAPSULE BY MOUTH EVERY day as needed for anxiety 01/03/22   Lavera Guise, MD  Ipratropium-Albuterol (COMBIVENT RESPIMAT) 20-100 MCG/ACT AERS respimat Inhale 1 puff into the lungs every 6 (six) hours as needed. Rescue inhaler 12/18/21   Lavera Guise, MD  ipratropium-albuterol (DUONEB) 0.5-2.5 (3) MG/3ML SOLN Take 3 mLs by nebulization every 4 (four) hours as needed. 04/18/22   Jonetta Osgood, NP  methocarbamol (ROBAXIN) 500 MG tablet Take 1 tablet (500 mg total) by mouth every 8 (eight) hours as needed for muscle spasms. 12/14/21   McDonough, Si Gaul, PA-C  Multiple Vitamin (MULTIVITAMIN WITH MINERALS) TABS tablet Take 1 tablet by mouth daily.    [provider]  omeprazole (PRILOSEC) 40 MG capsule TAKE 1 CAPSULE BY MOUTH  DAILY 03/26/22   Lavera Guise, MD  pilocarpine (SALAGEN) 5 MG tablet Take 1 tablet (5 mg total) by mouth 2 (two) times daily. 02/04/22   Lavera Guise, MD  revefenacin [facility]) 175 MCG/3ML nebulizer solution Take 3 mLs (175 mcg total) by nebulization daily. 04/18/22   Jonetta Osgood, NP  rosuvastatin (CRESTOR) 5 MG tablet TAKE 1 TABLET BY MOUTH DAILY. 02/11/22   Lavera Guise, MD  tiZANidine (ZANAFLEX) 4 MG tablet TAKE 1 TABLET BY MOUTH EVERY 6 HOURS AS NEEDED FOR MUSCLE SPASMS 02/04/22   Lavera Guise, MD    Physical Exam: Vitals:   05/03/22 1522 05/03/22 1654 05/03/22 1700 05/03/22 1912  BP: (!) 150/85 (!) 142/102 (!) 158/77 (!) 170/75  Pulse: 84 85 83 89  Resp: '18 18 20 18  '$ Temp: 98.1 F (  36.7 C) 98.1 F (36.7 C)    TempSrc: Oral     SpO2: 95% 98% 98% 98%  Weight: 102.5 kg     Height: '5\' 9"'$  (1.753 m)      General: Patient is alert awake and oriented to name/person/date of birth as well as location of hospital.  However patient is clearly not oriented well to time as he suggests that he has been in the ER already for 24 hours, he seems to think that this is daytime.  Patient does not appear to be in any distress.  Respiratory exam: Bilateral air entry vesicular Cardiovascular exam S1-S2 normal Abdomen soft nontender Extremities warm no edema Neurologic exam patient's speech actually seems fluid although the content of the speech is disoriented as above.  There is no focal finding in terms of motor exam Data Reviewed:  There are no new results to review at this time. Results for orders placed or performed during the hospital encounter of 05/03/22 (from the past 24 hour(s))  CBC with Differential     Status: Abnormal   Collection Time: 05/03/22  3:36 PM  Result Value Ref Range   WBC 7.1 4.0 - 10.5 K/uL   RBC 4.05 (L) 4.22 - 5.81 MIL/uL   Hemoglobin 12.1 (L) 13.0 - 17.0 g/dL   HCT 37.4 (L) 39.0 - 52.0 %   MCV 92.3 80.0 - 100.0 fL   MCH 29.9 26.0 - 34.0 pg   MCHC 32.4 30.0 -  36.0 g/dL   RDW 13.7 11.5 - 15.5 %   Platelets 162 150 - 400 K/uL   nRBC 0.0 0.0 - 0.2 %   Neutrophils Relative % 74 %   Neutro Abs 5.2 1.7 - 7.7 K/uL   Lymphocytes Relative 13 %   Lymphs Abs 1.0 0.7 - 4.0 K/uL   Monocytes Relative 9 %   Monocytes Absolute 0.7 0.1 - 1.0 K/uL   Eosinophils Relative 4 %   Eosinophils Absolute 0.3 0.0 - 0.5 K/uL   Basophils Relative 0 %   Basophils Absolute 0.0 0.0 - 0.1 K/uL   Immature Granulocytes 0 %   Abs Immature Granulocytes 0.02 0.00 - 0.07 K/uL  Basic metabolic panel     Status: Abnormal   Collection Time: 05/03/22  3:36 PM  Result Value Ref Range   Sodium 139 135 - 145 mmol/L   Potassium 4.9 3.5 - 5.1 mmol/L   Chloride 101 98 - 111 mmol/L   CO2 28 22 - 32 mmol/L   Glucose, Bld 107 (H) 70 - 99 mg/dL   BUN 15 8 - 23 mg/dL   Creatinine, Ser 1.28 (H) 0.61 - 1.24 mg/dL   Calcium 8.7 (L) 8.9 - 10.3 mg/dL   GFR, Estimated >60 >60 mL/min   Anion gap 10 5 - 15  Magnesium     Status: Abnormal   Collection Time: 05/03/22  3:36 PM  Result Value Ref Range   Magnesium 2.5 (H) 1.7 - 2.4 mg/dL  CK     Status: None   Collection Time: 05/03/22  3:36 PM  Result Value Ref Range   Total CK 59 49 - 397 U/L  Ammonia     Status: None   Collection Time: 05/03/22  3:36 PM  Result Value Ref Range   Ammonia 11 9 - 35 umol/L  Lactic acid, plasma     Status: None   Collection Time: 05/03/22  3:36 PM  Result Value Ref Range   Lactic Acid, Venous 0.7 0.5 - 1.9  mmol/L  Ethanol     Status: None   Collection Time: 05/03/22  3:36 PM  Result Value Ref Range   Alcohol, Ethyl (B) <10 <10 mg/dL  Troponin I (High Sensitivity)     Status: None   Collection Time: 05/03/22  3:36 PM  Result Value Ref Range   Troponin I (High Sensitivity) 4 <18 ng/L  Urine Drug Screen, Qualitative ([facility] only)     Status: Abnormal   Collection Time: 05/03/22  6:36 PM  Result Value Ref Range   Tricyclic, Ur Screen POSITIVE (A) NONE DETECTED   Amphetamines, Ur Screen NONE DETECTED NONE  DETECTED   MDMA (Ecstasy)Ur Screen NONE DETECTED NONE DETECTED   Cocaine Metabolite,Ur [place] NONE DETECTED NONE DETECTED   Opiate, Ur Screen NONE DETECTED NONE DETECTED   Phencyclidine (PCP) Ur S NONE DETECTED NONE DETECTED   Cannabinoid 50 Ng, Ur [place] NONE DETECTED NONE DETECTED   Barbiturates, Ur Screen NONE DETECTED NONE DETECTED   Benzodiazepine, Ur Scrn NONE DETECTED NONE DETECTED   Methadone Scn, Ur NONE DETECTED NONE DETECTED  Lactic acid, plasma     Status: None   Collection Time: 05/03/22  6:36 PM  Result Value Ref Range   Lactic Acid, Venous 0.9 0.5 - 1.9 mmol/L  Urinalysis, Routine w reflex microscopic Urine, Clean Catch     Status: Abnormal   Collection Time: 05/03/22  6:36 PM  Result Value Ref Range   Color, Urine YELLOW (A) YELLOW   APPearance CLEAR (A) CLEAR   Specific Gravity, Urine 1.012 1.005 - 1.030   pH 7.0 5.0 - 8.0   Glucose, UA NEGATIVE NEGATIVE mg/dL   Hgb urine dipstick NEGATIVE NEGATIVE   Bilirubin Urine NEGATIVE NEGATIVE   Ketones, ur NEGATIVE NEGATIVE mg/dL   Protein, ur NEGATIVE NEGATIVE mg/dL   Nitrite NEGATIVE NEGATIVE   Leukocytes,Ua NEGATIVE NEGATIVE  Troponin I (High Sensitivity)     Status: None   Collection Time: 05/03/22  6:36 PM  Result Value Ref Range   Troponin I (High Sensitivity) 5 <18 ng/L    IMPRESSION: 1. No acute infarct or hemorrhage. 2. Right maxillary and ethmoid sinus disease.  Assessment and Plan: Acute encephalopathy CT head within normal limits, metabolic workup at this time is within normal limits as well.  At this time high suspicion of cerebral infarction.  We will follow-up the MRI as ordered.  Appreciate neurology input.  Keep n.p.o. pending swallow screening.  Permissive hypertension. DAPT, STATIN      Advance Care Planning:   Code Status: Prior code  Consults: Neurology  Family Communication: Not applicable  Severity of Illness: The appropriate patient status for this patient is OBSERVATION. Observation status is  judged to be reasonable and necessary in order to provide the required intensity of service to ensure the patient's safety. The patient's presenting symptoms, physical exam findings, and initial radiographic and laboratory data in the context of their medical condition is felt to place them at decreased risk for further clinical deterioration. Furthermore, it is anticipated that the patient will be medically stable for discharge from the hospital within 2 midnights of admission.   Author: Gertie Fey, MD 05/03/2022 7:21 PM  For on call review [email or web address].

## 2022-05-03 NOTE — Progress Notes (Signed)
Neurology telephone note  Contacted by EDP Dr. Cherylann Banas about this 71 yo patient with hx EtOH and benzodiazepine abuse, COPD, DM2, HTN, hep C, chart dx of seizure disorder with no AEDs on his outpatient med list, nasopharyngeal cancer who presents with difficulty speaking. Per ED last known well was at some point yesterday or prior therefore stroke code was not activated. He has some difficulty following commands and also has word finding difficulty. It is not clear whether he is altered. CT head no acute process.  Recommendations: - Admit for stroke workup - AMS workup per admitting team if indicated - Permissive HTN x48 hrs from sx onset or until stroke ruled out by MRI goal BP <220/110. PRN labetalol or hydralazine if BP above these parameters. Avoid oral antihypertensives. - MRI brain with and without contrast (wwo 2/2 cancer history) - MRA H&N - TTE - Check A1c and LDL + add statin per guidelines - ASA '81mg'$  daily + plavix '75mg'$  daily x21 days f/b plavix '75mg'$  daily monotherapy after that - NPO until RN bedside dysphagia screen - q4 hr neuro checks - STAT head CT for any change in neuro exam - Tele - PT/OT/SLP - Stroke education - Amb referral to neurology upon discharge  - I will see in consultation tomorrow  Su Monks, MD [facility] [phone]  If 7pm- 7am, please page neurology on call as listed in [place].

## 2022-05-03 NOTE — ED Notes (Signed)
[facility]  called per  Dr  Cherylann Banas  MD

## 2022-05-03 NOTE — Assessment & Plan Note (Addendum)
CT head within normal limits, metabolic workup at this time is within normal limits as well.  At this time high suspicion of cerebral infarction.  We will follow-up the MRI as ordered.  Appreciate neurology input.  Keep n.p.o. pending swallow screening.  Permissive hypertension. DAPT, STATIN

## 2022-05-03 NOTE — ED Provider Notes (Signed)
[facility] Provider Note    Event Date/Time   First MD Initiated Contact with Patient 05/03/22 1627     (approximate)   History   Neurologic Problem   HPI {Remember to add pertinent medical, surgical, social, and/or OB history to HPI:1} [name] is a 71 y.o. male  ***with history of hypertension, hepatitis C, depression, alcohol and polysubstance abuse, and seizures who presents with**** aphasia and confusion, first noted around 10am today (confusion) then the aphasia shortly after.  Had at least one prior episode like this that lasted a few hours. Patient denies headache.    I reviewed the past medical records.  The patient most recent outpatient encounter was on 12/5 with general surgery for gallstones.  He was seen in the ED on 11/25 for a cough.  I do not see any recent ED visits or admissions for neurologic complaints.   Physical Exam   Triage Vital Signs: ED Triage Vitals [05/03/22 1522]  Enc Vitals Group     BP (!) 150/85     Pulse Rate 84     Resp 18     Temp 98.1 F (36.7 C)     Temp Source Oral     SpO2 95 %     Weight 226 lb (102.5 kg)     Height '5\' 9"'$  (1.753 m)     Head Circumference      Peak Flow      Pain Score 0     Pain Loc      Pain Edu?      Excl. in [place]?     Most recent vital signs: Vitals:   05/03/22 1522  BP: (!) 150/85  Pulse: 84  Resp: 18  Temp: 98.1 F (36.7 C)  SpO2: 95%    {Only need to document appropriate and relevant physical exam:1} General: Awake, no distress. *** CV:  Good peripheral perfusion. *** Resp:  Normal effort. *** Abd:  No distention. *** Other:  ***   ED Results / Procedures / Treatments   Labs (all labs ordered are listed, but only abnormal results are displayed) Labs Reviewed  CBC WITH DIFFERENTIAL/PLATELET - Abnormal; Notable for the following components:      Result Value   RBC 4.05 (*)    Hemoglobin 12.1 (*)    HCT 37.4 (*)    All other components within  normal limits  BASIC METABOLIC PANEL - Abnormal; Notable for the following components:   Glucose, Bld 107 (*)    Creatinine, Ser 1.28 (*)    Calcium 8.7 (*)    All other components within normal limits  MAGNESIUM - Abnormal; Notable for the following components:   Magnesium 2.5 (*)    All other components within normal limits  CK  AMMONIA  LACTIC ACID, PLASMA  ETHANOL  URINE DRUG SCREEN, QUALITATIVE ([facility] ONLY)  LACTIC ACID, PLASMA  URINALYSIS, ROUTINE W REFLEX MICROSCOPIC  TROPONIN I (HIGH SENSITIVITY)     EKG  ED ECG REPORT I, Arta Silence, the attending physician, personally viewed and interpreted this ECG.  Date: 05/03/2022 EKG Time: 1535 Rate: 83 Rhythm: normal sinus rhythm QRS Axis: Left axis Intervals: Incomplete RBBB ST/T Wave abnormalities: Nonspecific ST abnormalities Narrative Interpretation: no evidence of acute ischemia; no significant change when compared to EKG of 04/06/2022    RADIOLOGY  CT head: I independently viewed and interpreted the images; there is no ICH.  Radiology report indicates no acute abnormality  Chest x-ray: I independently  viewed and interpreted the images; there is no focal consolidation or edema  PROCEDURES:  Critical Care performed: {CriticalCareYesNo:19197::"Yes, see critical care procedure note(s)","No"}  Procedures   MEDICATIONS ORDERED IN ED: Medications - No data to display   IMPRESSION / MDM / [place] / ED COURSE  I reviewed the triage vital signs and the nursing notes.                              Differential diagnosis includes, but is not limited to, ***  Patient's presentation is most consistent with {EM COPA:27473}  *** {If the patient is on the monitor, remove the brackets and asterisks on the sentence below and remember to document it as a Procedure as well. Otherwise delete the sentence below:1} {**The patient is on the cardiac monitor to evaluate for evidence of arrhythmia and/or  significant heart rate changes.**} {Remember to include, when applicable, any/all of the following data: independent review of imaging independent review of labs (comment specifically on pertinent positives and negatives) review of specific prior hospitalizations, PCP/specialist notes, etc. discuss meds given and prescribed document any discussion with consultants (including hospitalists) any clinical decision tools you used and why (PECARN, NEXUS, etc.) did you consider admitting the patient? document social determinants of health affecting patient's care (homelessness, inability to follow up in a timely fashion, etc) document any pre-existing conditions increasing risk on current visit (e.g. diabetes and HTN increasing danger of high-risk chest pain/ACS) describes what meds you gave (especially parenteral) and why any other interventions?:1}   ----------------------------------------- 6:38 PM on 05/03/2022 -----------------------------------------   Consulted hospitalist  FINAL CLINICAL IMPRESSION(S) / ED DIAGNOSES   Final diagnoses:  None     Rx / DC Orders   ED Discharge Orders     None        Note:  This document was prepared using Dragon voice recognition software and may include unintentional dictation errors.-[id number]

## 2022-05-03 NOTE — ED Notes (Signed)
ED Provider at bedside. 

## 2022-05-03 NOTE — ED Notes (Signed)
Telestroke RN assessing patient.

## 2022-05-04 DIAGNOSIS — J9611 Chronic respiratory failure with hypoxia: Secondary | ICD-10-CM

## 2022-05-04 DIAGNOSIS — J431 Panlobular emphysema: Secondary | ICD-10-CM | POA: Diagnosis not present

## 2022-05-04 DIAGNOSIS — R4701 Aphasia: Principal | ICD-10-CM

## 2022-05-04 DIAGNOSIS — G9340 Encephalopathy, unspecified: Secondary | ICD-10-CM | POA: Diagnosis not present

## 2022-05-04 HISTORY — DX: Chronic respiratory failure with hypoxia: J96.11

## 2022-05-04 LAB — BASIC METABOLIC PANEL
Anion gap: 8 (ref 5–15)
BUN: 17 mg/dL (ref 8–23)
CO2: 28 mmol/L (ref 22–32)
Calcium: 8.4 mg/dL — ABNORMAL LOW (ref 8.9–10.3)
Chloride: 101 mmol/L (ref 98–111)
Creatinine, Ser: 1.15 mg/dL (ref 0.61–1.24)
GFR, Estimated: >60 mL/min (ref >60)
Glucose, Bld: 122 mg/dL — ABNORMAL HIGH (ref 70–99)
Potassium: 4.4 mmol/L (ref 3.5–5.1)
Sodium: 137 mmol/L (ref 135–145)

## 2022-05-04 LAB — LIPID PANEL
Cholesterol: 180 mg/dL (ref 0–200)
HDL: 57 mg/dL (ref >40)
LDL Cholesterol: 107 mg/dL — ABNORMAL HIGH (ref 0–99)
Total CHOL/HDL Ratio: 3.2 RATIO
Triglycerides: 79 mg/dL (ref <150)
VLDL: 16 mg/dL (ref 0–40)

## 2022-05-04 LAB — APTT: aPTT: 33 seconds (ref 24–36)

## 2022-05-04 LAB — PROTIME-INR
INR: 1.2 (ref 0.8–1.2)
Prothrombin Time: 15.2 seconds (ref 11.4–15.2)

## 2022-05-04 LAB — AMMONIA: Ammonia: <10 umol/L (ref 9–35)

## 2022-05-04 LAB — MAGNESIUM: Magnesium: 2.3 mg/dL (ref 1.7–2.4)

## 2022-05-04 MED ORDER — ALUM & MAG HYDROXIDE-SIMETH 200-200-20 MG/5ML PO SUSP
30.0000 mL | Freq: Once | ORAL | Status: AC
  Administered 2022-05-04: 30 mL via ORAL
  Filled 2022-05-04: qty 30

## 2022-05-04 NOTE — ED Notes (Signed)
Saturation while walking down to 89% no DOE, pt sat down and recovered to 97% on room air.

## 2022-05-04 NOTE — Discharge Instructions (Signed)
Follow-up with PCP in 1 week. Follow-up with neurology in 1 month

## 2022-05-04 NOTE — Hospital Course (Addendum)
[name] is a 71 y.o. male with history of alcohol drinking, benzodiazepine use, COPD, type 2 diabetes, essential hypertension, hepatitis C, history of seizure disorder who presents to the hospital with disorientation and difficulty speaking. MRI brain showed evidence of small vessel disease and atrophy.  No acute stroke.  Patient is seen by speech therapy, passed speech eval.  But has evidence of dementia. Patient was monitored overnight, mental status improved today.  Patient has no longer has any disorientation or confusion.  Speech disturbance has resolved.  At this point, patient is medically stable to be discharged. Patient also has history of COPD, he does not have worsening shortness of breath.  However, patient was seen by physical therapy, oxygen saturations dropped down to 82% with ambulation.  Patient does not have COPD exacerbation, he has a chronic hypoxemia, which could be the source altered mental status.  Will provide home oxygen.

## 2022-05-04 NOTE — Evaluation (Signed)
Physical Therapy Evaluation Patient Details Name: [name] MRN: [id number] DOB: 07/12/1950 Today's Date: 05/04/2022  History of Present Illness  Patient is a 71 year old male who presents to ED due to difficulty speaking and confusion. Pmh includes alcohol abuse, benzodiazepine dependence/withdrawal, COPD, depression, hepatitis c, HTN, nasopharyngeal cancer, seizures, sleep apnea, syncope   Clinical Impression  Patient is a pleasant 71 year old male who presents with generalized weakness and instability. Prior to hospital admission, pt was independent and lives alone.  Patient is in bed with a friend present and agreeable to PT. Patient is verbose and tangential requiring orientation to task. He is able to perform bed mobility independently but does require CGA for ambulation due to occasional posterior trunk lean when distracted. Patient's SP02 dropped to 82% with ambulation, nurse and physician notified. Patient session limited by arrival of physician.   Pt would benefit from skilled PT to address noted impairments and functional limitations (see below for any additional details).  Upon hospital discharge, pt would benefit from [facility].        Recommendations for follow up therapy are one component of a multi-disciplinary discharge planning process, led by the attending physician.  Recommendations may be updated based on patient status, additional functional criteria and insurance authorization.  Follow Up Recommendations Home health PT      Assistance Recommended at Discharge Intermittent Supervision/Assistance  Patient can return home with the following  A little help with walking and/or transfers;A little help with bathing/dressing/bathroom;Assistance with cooking/housework;Direct supervision/assist for financial management;Direct supervision/assist for medications management;Assist for transportation;Help with stairs or ramp for entrance    Equipment Recommendations None  recommended by PT  Recommendations for Other Services       Functional Status Assessment Patient has had a recent decline in their functional status and demonstrates the ability to make significant improvements in function in a reasonable and predictable amount of time.     Precautions / Restrictions Precautions Precautions: Fall Restrictions Weight Bearing Restrictions: No      Mobility  Bed Mobility Overal bed mobility: Independent             General bed mobility comments: able to transition supine<>sit independently    Transfers Overall transfer level: Modified independent Equipment used: Rolling walker (2 wheels)               General transfer comment: STS transfer with CGA and supervision    Ambulation/Gait Ambulation/Gait assistance: Min guard Gait Distance (Feet): 50 Feet Assistive device: Rolling walker (2 wheels) Gait Pattern/deviations: Step-through pattern, Leaning posteriorly Gait velocity: decreased     General Gait Details: Intermittent WFL gait with occasional posterior lean requiring use of RW  Stairs            Wheelchair Mobility    Modified Rankin (Stroke Patients Only)       Balance Overall balance assessment: Needs assistance Sitting-balance support: No upper extremity supported, Feet unsupported Sitting balance-Leahy Scale: Good Sitting balance - Comments: safe in seated position   Standing balance support: Bilateral upper extremity supported, During functional activity Standing balance-Leahy Scale: Fair Standing balance comment: occasional posterior trunk lean when distracted                             Pertinent Vitals/Pain Pain Assessment Pain Assessment: No/denies pain    Home Living Family/patient expects to be discharged to:: Private residence Living Arrangements: Alone Available Help at Discharge: Available PRN/intermittently (friends  live next door) Type of Home: Apartment Home Access:  Elevator       Home Layout: One level Home Equipment: Conservation officer, nature (2 wheels);Cane - single point;Rollator (4 wheels)      Prior Function Prior Level of Function : Independent/Modified Independent             Mobility Comments: Uses walker and cane occasionally. ADLs Comments: Sometimes needs help with shopping     Hand Dominance   Dominant Hand: Right    Extremity/Trunk Assessment   Upper Extremity Assessment Upper Extremity Assessment: Defer to OT evaluation    Lower Extremity Assessment Lower Extremity Assessment: Generalized weakness (Grossly 4/5)       Communication   Communication: No difficulties  Cognition Arousal/Alertness: Awake/alert   Overall Cognitive Status: History of cognitive impairments - at baseline                                 General Comments: oriented to self, location, and date. Needs occasional re-orientation to task, cues for scanning room        General Comments General comments (skin integrity, edema, etc.): Patient is well groomed and nourished    Exercises Other Exercises Other Exercises: Patient educated on role of PT in acute care setting, safe mobility and transfers.   Assessment/Plan    PT Assessment Patient needs continued PT services  PT Problem List Decreased strength;Decreased activity tolerance;Decreased mobility;Decreased knowledge of use of DME;Cardiopulmonary status limiting activity       PT Treatment Interventions DME instruction;Gait training;Stair training;Therapeutic exercise;Therapeutic activities;Functional mobility training;Balance training;Neuromuscular re-education;Cognitive remediation;Patient/family education    PT Goals (Current goals can be found in the Care Plan section)  Acute Rehab PT Goals Patient Stated Goal: to return home PT Goal Formulation: With patient Time For Goal Achievement: 05/18/22 Potential to Achieve Goals: Fair    Frequency Min 2X/week     Co-evaluation                AM-PAC PT "6 Clicks" Mobility  Outcome Measure Help needed turning from your back to your side while in a flat bed without using bedrails?: None Help needed moving from lying on your back to sitting on the side of a flat bed without using bedrails?: A Little Help needed moving to and from a bed to a chair (including a wheelchair)?: A Little Help needed standing up from a chair using your arms (e.g., wheelchair or bedside chair)?: None Help needed to walk in hospital room?: A Little Help needed climbing 3-5 steps with a railing? : A Little 6 Click Score: 20    End of Session Equipment Utilized During Treatment: Gait belt Activity Tolerance: Patient tolerated treatment well;Other (comment) (Oxygen saturation drop to 82% with ambulation) Patient left: in bed;with call bell/phone within reach;Other (comment) (with physician present) Nurse Communication: Mobility status;Other (comment) (De-saturation with ambulation) PT Visit Diagnosis: Unsteadiness on feet (R26.81);Other abnormalities of gait and mobility (R26.89);Muscle weakness (generalized) (M62.81)    Time: 3762-8315 PT Time Calculation (min) (ACUTE ONLY): 14 min   Charges:   PT Evaluation $PT Eval Moderate Complexity: 1 Mod         Janna Arch, PT, DPT  05/04/2022, 11:20 AM

## 2022-05-04 NOTE — Discharge Summary (Signed)
Physician Discharge Summary   Patient: [name] MRN: [id number] DOB: 1950-12-19  Admit date:     05/03/2022  Discharge date: 05/04/22  Discharge Physician: Sharen Hones   PCP: Lavera Guise, MD   Recommendations at discharge:   Follow-up with PCP in 1 week. Follow-up with neurology in 1 months.   Discharge Diagnoses: Principal Problem:   Aphasia Active Problems:   Seizure disorder ([place])   Essential hypertension   COPD (chronic obstructive pulmonary disease) (H[facility])   Acute encephalopathy   Chronic hypoxemic respiratory failure ([facility]) Dementia without behavioral disturbance. Diet-controlled diabetes. Resolved Problems:   * No resolved hospital problems. *  Hospital Course: [name] is a 71 y.o. male with history of alcohol drinking, benzodiazepine use, COPD, type 2 diabetes, essential hypertension, hepatitis C, history of seizure disorder who presents to the hospital with disorientation and difficulty speaking. MRI brain showed evidence of small vessel disease and atrophy.  No acute stroke.  Patient is seen by speech therapy, passed speech eval.  But has evidence of dementia. Patient was monitored overnight, mental status improved today.  Patient has no longer has any disorientation or confusion.  Speech disturbance has resolved.  At this point, patient is medically stable to be discharged. Patient also has history of COPD, he does not have worsening shortness of breath.  However, patient was seen by physical therapy, oxygen saturations dropped down to 82% with ambulation.  Patient does not have COPD exacerbation, he has a chronic hypoxemia, which could be the source altered mental status.  Will provide home oxygen.  Assessment and Plan: Acute encephalopathy Dementia without behavioral disturbance. Stroke is ruled out, patient has significant changes on MRI, also has memory disturbance consistent with chronic dementia.  Discussed with Dr. Quinn Axe, no  additional workup is needed, patient will be followed by pulmonology as outpatient. Patient has been evaluated by speech therapy, able to swallow without difficulty.  But has significant evidence of cognitive abnormality. Patient also eval by physical therapy, will be followed by PT as outpatient due to weakness.  Chronic hypoxemic respiratory failure COPD Does not seem to have worsening shortness of breath or cough.  No bronchospasm.  However, patient had a significant hypoxemia with oxygen saturation dropped down to 82% with mobility.  Patient appears to be chronic in nature, could be because of altered mental status.  Will prescribe 2 L oxygen at home, follow-up with PCP as outpatient.  Essential hypertension. Resume some home medicines.        Consultants: Neurology Procedures performed: None  Disposition: Home health Diet recommendation:  Discharge Diet Orders (From admission, onward)     Start     Ordered   05/04/22 0000  Diet - low sodium heart healthy        05/04/22 1055           Cardiac diet DISCHARGE MEDICATION: Allergies as of 05/04/2022       Reactions   Opana [oxymorphone Hcl]    Made him BLACKOUT   Elemental Sulfur    Childhood reaction    Oxymorphone Other (See Comments)   Oxymorphone    Sulfa Antibiotics Other (See Comments)   Sulfa Antibiotics         Medication List     STOP taking these medications    methocarbamol 500 MG tablet Commonly known as: ROBAXIN   multivitamin with minerals Tabs tablet       TAKE these medications    amitriptyline 75 MG tablet  Commonly known as: ELAVIL Take one tab po qhs for insomnia What changed:  how much to take how to take this when to take this   amLODipine 5 MG tablet Commonly known as: NORVASC TAKE 1 TABLET BY MOUTH DAILY   ammonium lactate 12 % lotion Commonly known as: AmLactin Apply 1 application. topically as needed for dry skin.   antiseptic oral rinse Liqd 15 mLs by Mouth  Rinse route as needed for dry mouth.   aspirin EC 81 MG tablet Take 81 mg by mouth daily. Swallow whole.   buprenorphine 15 MCG/HR Commonly known as: Butrans Place 1 patch onto the skin once a week.   chlorpheniramine-HYDROcodone 10-8 MG/5ML Commonly known as: TUSSIONEX Take 5 mLs by mouth every 12 (twelve) hours as needed for cough.   Combivent Respimat 20-100 MCG/ACT Aers respimat Generic drug: Ipratropium-Albuterol Inhale 1 puff into the lungs every 6 (six) hours as needed. Rescue inhaler   ipratropium-albuterol 0.5-2.5 (3) MG/3ML Soln Commonly known as: DUONEB Take 3 mLs by nebulization every 4 (four) hours as needed.   gabapentin 600 MG tablet Commonly known as: NEURONTIN TAKE 1 TABLET BY MOUTH EVERY MORNING, 1 TABLET AT NOON, 1 TABLET IN THE EVENING AND 1 TABLET AT BEDTIME AS DIRECTED. *NOTE DOSEAGE CHANGE*   hydrOXYzine 25 MG capsule Commonly known as: VISTARIL TAKE 1 CAPSULE BY MOUTH EVERY day as needed for anxiety What changed:  how much to take how to take this when to take this reasons to take this   omeprazole 40 MG capsule Commonly known as: PRILOSEC TAKE 1 CAPSULE BY MOUTH DAILY   pilocarpine 5 MG tablet Commonly known as: SALAGEN Take 1 tablet (5 mg total) by mouth 2 (two) times daily.   revefenacin 175 MCG/3ML nebulizer solution Commonly known as: YUPELRI Take 3 mLs (175 mcg total) by nebulization daily.   rosuvastatin 5 MG tablet Commonly known as: CRESTOR TAKE 1 TABLET BY MOUTH DAILY.   tiZANidine 4 MG tablet Commonly known as: ZANAFLEX TAKE 1 TABLET BY MOUTH EVERY 6 HOURS AS NEEDED FOR MUSCLE SPASMS   Trelegy Ellipta 100-62.5-25 MCG/ACT Aepb Generic drug: Fluticasone-Umeclidin-Vilant Inhale 1 puff into the lungs daily.               Durable Medical Equipment  (From admission, onward)           Start     Ordered   05/04/22 1053  For home use only DME oxygen  Once       Question Answer Comment  Length of Need 6 Months    Mode or (Route) Nasal cannula   Liters per Minute 2   Frequency Continuous (stationary and portable oxygen unit needed)   Oxygen conserving device Yes   Oxygen delivery system Gas      05/04/22 1052            Follow-up Information     Lavera Guise, MD Follow up in 1 week(s).   Specialties: Internal Medicine, Cardiology Contact information: [place] [address] [phone]         [facility] Follow up in 1 month(s).   Contact information: [place] [phone]               Discharge Exam: Danley Danker Weights   05/03/22 1522  Weight: 102.5 kg   General exam: Appears calm and comfortable  Respiratory system: Decreased breathing sounds. Respiratory effort normal. Cardiovascular system: S1 & S2  heard, RRR. No JVD, murmurs, rubs, gallops or clicks. No pedal edema. Gastrointestinal system: Abdomen is nondistended, soft and nontender. No organomegaly or masses felt. Normal bowel sounds heard. Central nervous system: Alert and oriented. No focal neurological deficits. Extremities: Symmetric 5 x 5 power. Skin: No rashes, lesions or ulcers Psychiatry: Judgement and insight appear normal. Mood & affect appropriate.    Condition at discharge: good  The results of significant diagnostics from this hospitalization (including imaging, microbiology, ancillary and laboratory) are listed below for reference.   Imaging Studies: MR ANGIO HEAD WO CONTRAST  Result Date: 05/03/2022 CLINICAL DATA:  Initial evaluation for neuro deficit, stroke suspected, slurred speech. History of nasopharyngeal carcinoma. EXAM: MRI HEAD WITHOUT AND WITH CONTRAST MRA HEAD WITHOUT CONTRAST MRA NECK WITHOUT AND WITH CONTRAST TECHNIQUE: Multiplanar, multi-echo pulse sequences of the brain and surrounding structures were acquired without intravenous contrast. Angiographic images of the Circle of Willis were  acquired using MRA technique without intravenous contrast. Angiographic images of the neck were acquired using MRA technique without and with intravenous contrast. Carotid stenosis measurements (when applicable) are obtained utilizing NASCET criteria, using the distal internal carotid diameter as the denominator. CONTRAST:  51m GADAVIST GADOBUTROL 1 MMOL/ML [place] SOLN COMPARISON:  Prior PET-CT from 05/24/2019. FINDINGS: MRI HEAD FINDINGS Brain: Mildly advanced cerebral atrophy for age. Scattered patchy T2/FLAIR hyperintensity involving the periventricular, deep, and subcortical white matter both cerebral hemispheres, nonspecific, but most commonly related to chronic microvascular ischemic disease. Mild patchy involvement of the pons noted. Overall appearance is mild for age. No evidence for acute or subacute ischemia. Gray-white matter differentiation maintained. No areas of chronic cortical infarction or other insult. No acute or chronic intracranial blood products. No mass lesion, midline shift or mass effect. No hydrocephalus or extra-axial fluid collection. Pituitary gland and suprasellar region within normal limits. No abnormal enhancement. Vascular: Major intracranial vascular flow voids are maintained. Skull and upper cervical spine: Craniocervical junction within normal limits. Bone marrow signal intensity normal. No focal marrow replacing lesion. No scalp soft tissue abnormality. Sinuses/Orbits: Globes and orbital soft tissues demonstrate no acute finding. Chronic right maxillary and ethmoidal sinusitis noted. No significant mastoid effusion. Other: Visualized nasopharynx is unremarkable without visible residual or recurrent neoplasm. MRA HEAD FINDINGS Anterior circulation: Both internal carotid arteries widely patent to the termini without stenosis. A1 segments widely patent. Normal anterior communicating artery complex. Both anterior cerebral arteries widely patent to their distal aspects without stenosis.  No M1 stenosis or occlusion. Normal MCA bifurcations. Distal MCA branches well perfused and symmetric. Posterior circulation: Both vertebral arteries patent without stenosis. Left vertebral artery dominant. Left PICA patent at its origin. Right PICA not well seen. Basilar patent without stenosis. Superior cerebral arteries patent bilaterally. Both PCAs primarily supplied via the basilar. Subtle short-segment mild stenosis noted at the proximal left P2 segment (series 1039, image 7). PCAs otherwise widely patent to their distal aspects without stenosis. Anatomic variants: Dominant left vertebral artery.  No aneurysm. MRA NECK FINDINGS Aortic arch: Visualized aortic arch normal caliber with standard 3 vessel morphology. No stenosis or other abnormality about the origin the great vessels. Right carotid system: Right common and internal carotid arteries patent without evidence for dissection. No significant atheromatous irregularity or narrowing about the right carotid bulb. Left carotid system: Left common and internal carotid arteries patent without evidence for dissection. Mild for age atheromatous irregularity about the left carotid bulb, but no hemodynamically significant greater than 50% stenosis. Vertebral arteries: Both vertebral arteries arise from the subclavian arteries.  No proximal subclavian artery stenosis. Left vertebral artery dominant. Suspected moderate to severe stenosis at the origin of the right vertebral artery (series 1100, image 9). Vertebral arteries otherwise patent without stenosis or dissection. Other: None IMPRESSION: MRI HEAD IMPRESSION: 1. No acute intracranial abnormality. 2. Mildly advanced cerebral atrophy for age with mild chronic small vessel ischemic disease. 3. No MRI evidence for residual or recurrent nasopharyngeal mass. No metastatic disease. MRA HEAD IMPRESSION: Negative intracranial MRA for large vessel occlusion. No hemodynamically significant or correctable stenosis. No  aneurysm. MRA NECK IMPRESSION: 1. 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 Otherwise wide patency of both vertebral arteries within the neck. Left vertebral artery dominant. 2. Mild for age atheromatous irregularity about the left carotid bulb, but no hemodynamically significant greater than 50% stenosis. 3. Wide patency of the right carotid artery system within the neck. Electronically Signed   By: Jeannine Boga M.D.   On: 05/03/2022 23:26   MR BRAIN W WO CONTRAST  Result Date: 05/03/2022 CLINICAL DATA:  Initial evaluation for neuro deficit, stroke suspected, slurred speech. History of nasopharyngeal carcinoma. EXAM: MRI HEAD WITHOUT AND WITH CONTRAST MRA HEAD WITHOUT CONTRAST MRA NECK WITHOUT AND WITH CONTRAST TECHNIQUE: Multiplanar, multi-echo pulse sequences of the brain and surrounding structures were acquired without intravenous contrast. Angiographic images of the Circle of Willis were acquired using MRA technique without intravenous contrast. Angiographic images of the neck were acquired using MRA technique without and with intravenous contrast. Carotid stenosis measurements (when applicable) are obtained utilizing NASCET criteria, using the distal internal carotid diameter as the denominator. CONTRAST:  37m GADAVIST GADOBUTROL 1 MMOL/ML [place] SOLN COMPARISON:  Prior PET-CT from 05/24/2019. FINDINGS: MRI HEAD FINDINGS Brain: Mildly advanced cerebral atrophy for age. Scattered patchy T2/FLAIR hyperintensity involving the periventricular, deep, and subcortical white matter both cerebral hemispheres, nonspecific, but most commonly related to chronic microvascular ischemic disease. Mild patchy involvement of the pons noted. Overall appearance is mild for age. No evidence for acute or subacute ischemia. Gray-white matter differentiation maintained. No areas of chronic cortical infarction or other insult. No acute or chronic intracranial blood products. No mass  lesion, midline shift or mass effect. No hydrocephalus or extra-axial fluid collection. Pituitary gland and suprasellar region within normal limits. No abnormal enhancement. Vascular: Major intracranial vascular flow voids are maintained. Skull and upper cervical spine: Craniocervical junction within normal limits. Bone marrow signal intensity normal. No focal marrow replacing lesion. No scalp soft tissue abnormality. Sinuses/Orbits: Globes and orbital soft tissues demonstrate no acute finding. Chronic right maxillary and ethmoidal sinusitis noted. No significant mastoid effusion. Other: Visualized nasopharynx is unremarkable without visible residual or recurrent neoplasm. MRA HEAD FINDINGS Anterior circulation: Both internal carotid arteries widely patent to the termini without stenosis. A1 segments widely patent. Normal anterior communicating artery complex. Both anterior cerebral arteries widely patent to their distal aspects without stenosis. No M1 stenosis or occlusion. Normal MCA bifurcations. Distal MCA branches well perfused and symmetric. Posterior circulation: Both vertebral arteries patent without stenosis. Left vertebral artery dominant. Left PICA patent at its origin. Right PICA not well seen. Basilar patent without stenosis. Superior cerebral arteries patent bilaterally. Both PCAs primarily supplied via the basilar. Subtle short-segment mild stenosis noted at the proximal left P2 segment (series 1039, image 7). PCAs otherwise widely patent to their distal aspects without stenosis. Anatomic variants: Dominant left vertebral artery.  No aneurysm. MRA NECK FINDINGS Aortic arch: Visualized aortic arch normal caliber with standard 3 vessel morphology. No stenosis  or other abnormality about the origin the great vessels. Right carotid system: Right common and internal carotid arteries patent without evidence for dissection. No significant atheromatous irregularity or narrowing about the right carotid bulb.  Left carotid system: Left common and internal carotid arteries patent without evidence for dissection. Mild for age atheromatous irregularity about the left carotid bulb, but no hemodynamically significant greater than 50% stenosis. Vertebral arteries: Both vertebral arteries arise from the subclavian arteries. No proximal subclavian artery stenosis. Left vertebral artery dominant. Suspected moderate to severe stenosis at the origin of the right vertebral artery (series 1100, image 9). Vertebral arteries otherwise patent without stenosis or dissection. Other: None IMPRESSION: MRI HEAD IMPRESSION: 1. No acute intracranial abnormality. 2. Mildly advanced cerebral atrophy for age with mild chronic small vessel ischemic disease. 3. No MRI evidence for residual or recurrent nasopharyngeal mass. No metastatic disease. MRA HEAD IMPRESSION: Negative intracranial MRA for large vessel occlusion. No hemodynamically significant or correctable stenosis. No aneurysm. MRA NECK IMPRESSION: 1. 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 Otherwise wide patency of both vertebral arteries within the neck. Left vertebral artery dominant. 2. Mild for age atheromatous irregularity about the left carotid bulb, but no hemodynamically significant greater than 50% stenosis. 3. Wide patency of the right carotid artery system within the neck. Electronically Signed   By: Jeannine Boga M.D.   On: 05/03/2022 23:26   MR ANGIO NECK W WO CONTRAST  Result Date: 05/03/2022 CLINICAL DATA:  Initial evaluation for neuro deficit, stroke suspected, slurred speech. History of nasopharyngeal carcinoma. EXAM: MRI HEAD WITHOUT AND WITH CONTRAST MRA HEAD WITHOUT CONTRAST MRA NECK WITHOUT AND WITH CONTRAST TECHNIQUE: Multiplanar, multi-echo pulse sequences of the brain and surrounding structures were acquired without intravenous contrast. Angiographic images of the Circle of Willis were acquired using MRA  technique without intravenous contrast. Angiographic images of the neck were acquired using MRA technique without and with intravenous contrast. Carotid stenosis measurements (when applicable) are obtained utilizing NASCET criteria, using the distal internal carotid diameter as the denominator. CONTRAST:  56m GADAVIST GADOBUTROL 1 MMOL/ML [place] SOLN COMPARISON:  Prior PET-CT from 05/24/2019. FINDINGS: MRI HEAD FINDINGS Brain: Mildly advanced cerebral atrophy for age. Scattered patchy T2/FLAIR hyperintensity involving the periventricular, deep, and subcortical white matter both cerebral hemispheres, nonspecific, but most commonly related to chronic microvascular ischemic disease. Mild patchy involvement of the pons noted. Overall appearance is mild for age. No evidence for acute or subacute ischemia. Gray-white matter differentiation maintained. No areas of chronic cortical infarction or other insult. No acute or chronic intracranial blood products. No mass lesion, midline shift or mass effect. No hydrocephalus or extra-axial fluid collection. Pituitary gland and suprasellar region within normal limits. No abnormal enhancement. Vascular: Major intracranial vascular flow voids are maintained. Skull and upper cervical spine: Craniocervical junction within normal limits. Bone marrow signal intensity normal. No focal marrow replacing lesion. No scalp soft tissue abnormality. Sinuses/Orbits: Globes and orbital soft tissues demonstrate no acute finding. Chronic right maxillary and ethmoidal sinusitis noted. No significant mastoid effusion. Other: Visualized nasopharynx is unremarkable without visible residual or recurrent neoplasm. MRA HEAD FINDINGS Anterior circulation: Both internal carotid arteries widely patent to the termini without stenosis. A1 segments widely patent. Normal anterior communicating artery complex. Both anterior cerebral arteries widely patent to their distal aspects without stenosis. No M1 stenosis or  occlusion. Normal MCA bifurcations. Distal MCA branches well perfused and symmetric. Posterior circulation: Both vertebral arteries patent without stenosis. Left vertebral artery dominant.  Left PICA patent at its origin. Right PICA not well seen. Basilar patent without stenosis. Superior cerebral arteries patent bilaterally. Both PCAs primarily supplied via the basilar. Subtle short-segment mild stenosis noted at the proximal left P2 segment (series 1039, image 7). PCAs otherwise widely patent to their distal aspects without stenosis. Anatomic variants: Dominant left vertebral artery.  No aneurysm. MRA NECK FINDINGS Aortic arch: Visualized aortic arch normal caliber with standard 3 vessel morphology. No stenosis or other abnormality about the origin the great vessels. Right carotid system: Right common and internal carotid arteries patent without evidence for dissection. No significant atheromatous irregularity or narrowing about the right carotid bulb. Left carotid system: Left common and internal carotid arteries patent without evidence for dissection. Mild for age atheromatous irregularity about the left carotid bulb, but no hemodynamically significant greater than 50% stenosis. Vertebral arteries: Both vertebral arteries arise from the subclavian arteries. No proximal subclavian artery stenosis. Left vertebral artery dominant. Suspected moderate to severe stenosis at the origin of the right vertebral artery (series 1100, image 9). Vertebral arteries otherwise patent without stenosis or dissection. Other: None IMPRESSION: MRI HEAD IMPRESSION: 1. No acute intracranial abnormality. 2. Mildly advanced cerebral atrophy for age with mild chronic small vessel ischemic disease. 3. No MRI evidence for residual or recurrent nasopharyngeal mass. No metastatic disease. MRA HEAD IMPRESSION: Negative intracranial MRA for large vessel occlusion. No hemodynamically significant or correctable stenosis. No aneurysm. MRA NECK  IMPRESSION: 1. 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 Otherwise wide patency of both vertebral arteries within the neck. Left vertebral artery dominant. 2. Mild for age atheromatous irregularity about the left carotid bulb, but no hemodynamically significant greater than 50% stenosis. 3. Wide patency of the right carotid artery system within the neck. Electronically Signed   By: Jeannine Boga M.D.   On: 05/03/2022 23:26   DG Chest 1 View  Result Date: 05/03/2022 CLINICAL DATA:  Aphasia. EXAM: CHEST  1 VIEW COMPARISON:  Chest x-ray 05/03/2022. FINDINGS: The heart size and mediastinal contours are within normal limits. Both lungs are clear. No visible pleural effusions or pneumothorax. No acute osseous abnormality. IMPRESSION: No evidence of acute cardiopulmonary disease. Electronically Signed   By: Margaretha Sheffield M.D.   On: 05/03/2022 19:56   CT Head Wo Contrast  Result Date: 05/03/2022 CLINICAL DATA:  Slurred speech, aphasia, altered level of consciousness EXAM: CT HEAD WITHOUT CONTRAST TECHNIQUE: Contiguous axial images were obtained from the base of the skull through the vertex without intravenous contrast. RADIATION DOSE REDUCTION: This exam was performed according to the departmental dose-optimization program which includes automated exposure control, adjustment of the mA and/or kV according to patient size and/or use of iterative reconstruction technique. COMPARISON:  12/10/2019 FINDINGS: Brain: No acute infarct or hemorrhage. Lateral ventricles and midline structures are unremarkable. No acute extra-axial fluid collections. No mass effect. Vascular: No hyperdense vessel or unexpected calcification. Skull: Normal. Negative for fracture or focal lesion. Sinuses/Orbits: Opacification of the right maxillary sinus. Prominent mucoperiosteal thickening within the right ethmoid air cells. Other: None. IMPRESSION: 1. No acute infarct or hemorrhage. 2. Right  maxillary and ethmoid sinus disease. Electronically Signed   By: Randa Ngo M.D.   On: 05/03/2022 16:07   DG Chest 2 View  Result Date: 05/03/2022 CLINICAL DATA:  Slurred speech and aphasia EXAM: CHEST - 2 VIEW COMPARISON:  04/06/2022 FINDINGS: Cardiac shadow is at the upper limits of normal size. Aortic calcifications are seen. Lungs are clear bilaterally. No  bony abnormality is seen. IMPRESSION: No acute abnormality noted. Electronically Signed   By: Inez Catalina M.D.   On: 05/03/2022 15:54   US Abdomen Limited RUQ (LIVER/GB)  Result Date: 04/06/2022 CLINICAL DATA:  Pain EXAM: ULTRASOUND ABDOMEN LIMITED RIGHT UPPER QUADRANT COMPARISON:  12/12/2021 FINDINGS: Gallbladder: There are multiple stones noted within the gallbladder measuring up to 2.1 mm. Gallbladder wall thickness is upper limits of normal measuring 2.9 mm. No pericholecystic flow and or sonographic Murphy's sign. Common bile duct: Diameter: 6.2 mm.  Previously 6 mm. Liver: The liver has a coarsened echotexture with mild increased echogenicity. No focal abnormality identified. Portal vein is patent on color Doppler imaging with normal direction of blood flow towards the liver. Other: None. IMPRESSION: 1. Cholelithiasis. No secondary signs of acute cholecystitis. 2. Common bile duct measures 6.2 mm which is upper limits of normal. 3. Coarsened echotexture and mild increased echogenicity of the liver suggestive of hepatic steatosis. Electronically Signed   By: Kerby Moors M.D.   On: 04/06/2022 10:52   DG Chest 2 View  Result Date: 04/06/2022 CLINICAL DATA:  Cough. EXAM: CHEST - 2 VIEW COMPARISON:  December 07, 2021. FINDINGS: The heart size and mediastinal contours are within normal limits. Both lungs are clear. The visualized skeletal structures are unremarkable. IMPRESSION: No active cardiopulmonary disease. Electronically Signed   By: Marijo Conception M.D.   On: 04/06/2022 08:53    Microbiology: Results for orders placed or performed  during the hospital encounter of 04/06/22  SARS Coronavirus 2 by RT PCR (hospital order, performed in [facility] hospital lab) *cepheid single result test* Anterior Nasal Swab     Status: None   Collection Time: 04/06/22  8:22 AM   Specimen: Anterior Nasal Swab  Result Value Ref Range Status   SARS Coronavirus 2 by RT PCR NEGATIVE NEGATIVE Final    Comment: (NOTE) SARS-CoV-2 target nucleic acids are NOT DETECTED.  The SARS-CoV-2 RNA is generally detectable in upper and lower respiratory specimens during the acute phase of infection. The lowest concentration of SARS-CoV-2 viral copies this assay can detect is 250 copies / mL. A negative result does not preclude SARS-CoV-2 infection and should not be used as the sole basis for treatment or other patient management decisions.  A negative result may occur with improper specimen collection / handling, submission of specimen other than nasopharyngeal swab, presence of viral mutation(s) within the areas targeted by this assay, and inadequate number of viral copies (<250 copies / mL). A negative result must be combined with clinical observations, patient history, and epidemiological information.  Fact Sheet for Patients:   https://www.patel.info/  Fact Sheet for Healthcare Providers: https://hall.com/  This test is not yet approved or  cleared by the [place] FDA and has been authorized for detection and/or diagnosis of SARS-CoV-2 by FDA under an Emergency Use Authorization (EUA).  This EUA will remain in effect (meaning this test can be used) for the duration of the COVID-19 declaration under Section 564(b)(1) of the Act, 21 U.S.C. section 360bbb-3(b)(1), unless the authorization is terminated or revoked sooner.  Performed at [facility], [address] [place], [place] [address]   Resp Panel by RT-PCR (Flu A&B, Covid) Anterior Nasal Swab     Status: None   Collection Time:  04/06/22  8:22 AM   Specimen: Anterior Nasal Swab  Result Value Ref Range Status   SARS Coronavirus 2 by RT PCR NEGATIVE NEGATIVE Final    Comment: (NOTE) SARS-CoV-2 target nucleic acids  are NOT DETECTED.  The SARS-CoV-2 RNA is generally detectable in upper respiratory specimens during the acute phase of infection. The lowest concentration of SARS-CoV-2 viral copies this assay can detect is 138 copies/mL. A negative result does not preclude SARS-Cov-2 infection and should not be used as the sole basis for treatment or other patient management decisions. A negative result may occur with  improper specimen collection/handling, submission of specimen other than nasopharyngeal swab, presence of viral mutation(s) within the areas targeted by this assay, and inadequate number of viral copies(<138 copies/mL). A negative result must be combined with clinical observations, patient history, and epidemiological information. The expected result is Negative.  Fact Sheet for Patients:  EntrepreneurPulse.com.au  Fact Sheet for Healthcare Providers:  IncredibleEmployment.be  This test is no t yet approved or cleared by the [place] FDA and  has been authorized for detection and/or diagnosis of SARS-CoV-2 by FDA under an Emergency Use Authorization (EUA). This EUA will remain  in effect (meaning this test can be used) for the duration of the COVID-19 declaration under Section 564(b)(1) of the Act, 21 U.S.C.section 360bbb-3(b)(1), unless the authorization is terminated  or revoked sooner.       Influenza A by PCR NEGATIVE NEGATIVE Final   Influenza B by PCR NEGATIVE NEGATIVE Final    Comment: (NOTE) The Xpert Xpress SARS-CoV-2/FLU/RSV plus assay is intended as an aid in the diagnosis of influenza from Nasopharyngeal swab specimens and should not be used as a sole basis for treatment. Nasal washings and aspirates are unacceptable for Xpert Xpress  SARS-CoV-2/FLU/RSV testing.  Fact Sheet for Patients: EntrepreneurPulse.com.au  Fact Sheet for Healthcare Providers: IncredibleEmployment.be  This test is not yet approved or cleared by the [place] FDA and has been authorized for detection and/or diagnosis of SARS-CoV-2 by FDA under an Emergency Use Authorization (EUA). This EUA will remain in effect (meaning this test can be used) for the duration of the COVID-19 declaration under Section 564(b)(1) of the Act, 21 U.S.C. section 360bbb-3(b)(1), unless the authorization is terminated or revoked.  Performed at [facility], [address] [place], [place] [address]   Blood Culture (routine x 2)     Status: None   Collection Time: 04/06/22  9:26 AM   Specimen: BLOOD  Result Value Ref Range Status   Specimen Description BLOOD RIGHT WRIST  Final   Special Requests   Final    BOTTLES DRAWN AEROBIC AND ANAEROBIC Blood Culture results may not be optimal due to an excessive volume of blood received in culture bottles   Culture   Final    NO GROWTH 5 DAYS Performed at [facility], [address] [place], [place] [address]    Report Status 04/11/2022 FINAL  Final  Blood Culture (routine x 2)     Status: None   Collection Time: 04/06/22  9:26 AM   Specimen: BLOOD  Result Value Ref Range Status   Specimen Description BLOOD BLOOD LEFT HAND  Final   Special Requests   Final    BOTTLES DRAWN AEROBIC AND ANAEROBIC Blood Culture results may not be optimal due to an excessive volume of blood received in culture bottles   Culture   Final    NO GROWTH 5 DAYS Performed at [facility], [address], [place], [place] [address]    Report Status 04/11/2022 FINAL  Final  Urine Culture     Status: Abnormal   Collection Time: 04/06/22 10:12 AM   Specimen: In/Out Cath Urine  Result Value Ref Range Status   Specimen Description   Final    IN/OUT CATH URINE Performed at  [facility], [address] [place], [place] [address]    Special Requests   Final    NONE Performed at [facility], [place], [place] [address]    Culture 1,000 COLONIES/mL ENTEROCOCCUS FAECALIS (A)  Final   Report Status 04/09/2022 FINAL  Final   Organism ID, Bacteria ENTEROCOCCUS FAECALIS (A)  Final      Susceptibility   Enterococcus faecalis - MIC*    AMPICILLIN <=2 SENSITIVE Sensitive     NITROFURANTOIN <=16 SENSITIVE Sensitive     VANCOMYCIN 1 SENSITIVE Sensitive     * 1,000 COLONIES/mL ENTEROCOCCUS FAECALIS    Labs: CBC: Recent Labs  Lab 05/03/22 1536  WBC 7.1  NEUTROABS 5.2  HGB 12.1*  HCT 37.4*  MCV 92.3  PLT 045   Basic Metabolic Panel: Recent Labs  Lab 05/03/22 1536 05/04/22 0534  NA 139 137  K 4.9 4.4  CL 101 101  CO2 28 28  GLUCOSE 107* 122*  BUN 15 17  CREATININE 1.28* 1.15  CALCIUM 8.7* 8.4*  MG 2.5* 2.3   Liver Function Tests: No results for input(s): "AST", "ALT", "ALKPHOS", "BILITOT", "PROT", "ALBUMIN" in the last 168 hours. CBG: No results for input(s): "GLUCAP" in the last 168 hours.  Discharge time spent: greater than 30 minutes.  Signed: Sharen Hones, MD [facility] 05/04/2022

## 2022-05-04 NOTE — Evaluation (Addendum)
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[place])    Acute encephalopathy 11/03/2019   Depression, major, single episode, moderate ([place]) 09/11/2019   Weakness 07/22/2019   Chemotherapy induced neutropenia ([place]) 07/20/2019   COPD, 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   Goals of care, counseling/discussion 05/11/2019   Non-recurrent acute serous otitis media of left ear 03/22/2019   Cellulitis and abscess of head 03/02/2019   Atopic dermatitis 02/14/2019   Vitamin B12 deficiency 10/24/2018   Stomatitis and mucositis 10/24/2018   Low back pain due to bilateral sciatica 07/20/2018   Restless leg syndrome 07/20/2018   Mixed hyperlipidemia 03/03/2018   Thrush, oral 12/17/2017   Depression 08/19/2017   History of hepatitis C 08/19/2017   Syncope 08/19/2017   Gastroesophageal reflux disease without esophagitis 08/19/2017   Encounter for general adult medical examination with abnormal findings 08/06/2017   Primary insomnia 08/06/2017   Inflammatory polyarthritis ([place]) 08/06/2017   Screening for colon cancer 08/06/2017   Dysuria 08/06/2017   Chemotherapy-induced peripheral neuropathy ([place]) 01/05/2015   Complex regional pain syndrome of both lower extremities 01/05/2015   Neuralgia 01/05/2015   Seizures ([place]) 11/29/2013   Benzodiazepine withdrawal ([place]) 11/29/2013   Seizure disorder ([place])  11/29/2013   Alcohol abuse 11/29/2013   Essential hypertension 11/29/2013   Hepatitis C 11/29/2013   Abnormal chest x-ray 11/29/2013   Obesity 11/29/2013   Tobacco use disorder 11/29/2013   Past Medical History:  Past Medical History:  Diagnosis Date   Alcohol abuse    Benzodiazepine dependence ([place])    Benzodiazepine withdrawal ([facility])    Chronic pain in right foot    COPD (chronic obstructive pulmonary disease) ([place])    Depression 08/19/2017   Hepatitis C    Hypertension    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[place])    Seizures ([place]) 2010   Sleep apnea    Syncope    questionable vasovagal   Past Surgical History:  Past Surgical History:  Procedure Laterality Date   COLONOSCOPY WITH PROPOFOL N/A 12/16/2016   Procedure: COLONOSCOPY WITH PROPOFOL;  Surgeon: Lollie Sails, MD;  Location: [facility] ENDOSCOPY;  Service: Endoscopy;  Laterality: N/A;   COLONOSCOPY WITH PROPOFOL N/A 03/14/2017   Procedure: COLONOSCOPY WITH PROPOFOL;  Surgeon: Lollie Sails, MD;  Location: [facility] ENDOSCOPY;  Service: Endoscopy;  Laterality: N/A;   COLONOSCOPY WITH PROPOFOL     FOOT SURGERY Right 03/12/2002   FOOT SURGERY     HEMORRHOID SURGERY     LITHOTRIPSY     MYRINGOTOMY     MYRINGOTOMY WITH TUBE PLACEMENT Left 04/28/2019   Procedure: MYRINGOTOMY WITH TUBE PLACEMENT;  Surgeon: Carloyn Manner, MD;  Location: [facility];  Service: ENT;  Laterality: Left;   NASOPHARYNGOSCOPY N/A 04/28/2019   Procedure: NASOPHARYNGOSCOPY WITH BIOPSY;  Surgeon: Carloyn Manner, MD;  Location: [facility];  Service: ENT;  Laterality: N/A;   NASOPHARYNGOSCOPY     Port a cath  Placement     PORTA CATH INSERTION N/A 05/20/2019   Procedure: PORTA CATH INSERTION;  Surgeon: Algernon Huxley, MD;  Location: [facility] CV LAB;  Service: Cardiovascular;  Laterality: N/A;   PORTA CATH REMOVAL N/A 11/29/2020   Procedure: PORTA CATH REMOVAL;  Surgeon: Algernon Huxley, MD;  Location: [facility] CV LAB;  Service:  Cardiovascular;  Laterality: N/A;   TONSILLECTOMY     HPI:  Pt is a 71 y.o. male with history of hypertension, hepatitis C, ETOH abuse, depression, alcohol and polysubstance abuse, and seizures, nasopharyngeal cancer, who presents with aphasia and confusion.  The patient's roommate first noted that he seemed confused around 10 AM today and then the aphasia started shortly afterwards.  Per the sister, the patient had at least one prior episode like this that lasted a few hours about a week or 2 ago.  WBC 7.1; afebreile. On RA.  MRI: No acute intracranial abnormality.  2. Mildly advanced cerebral atrophy for age with mild chronic small  vessel ischemic disease.  3. No MRI evidence for residual or recurrent nasopharyngeal mass. No  metastatic disease.   Assessment / Plan / Recommendation Clinical Impression  Patient appears to present with moderate cognitive communication impairments characterized by deficits in memory, insight and awareness which contribute to decreased function in higher level skills including problem solving(safety) and executive function. Patient was cooperative and able to identify that he is having difficulties with memory.  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 Verbal communication is fully Intelligible w/out pronounced dysarthria but decreased fluency and output during confrontational questions; he can follow basic 1-step commands. Cognitive decline was noted in areas of Executive function, Memory/Recall of recent, Math calculations, Insight w/ problem solving in safety situations -- he was limited to answering w/ "I'd call my Sister who is in town". W/ verbal cues, he could figure it out basic problem situations including calling 911(but w/ cue needed). He does NOT have to do much finances -- EVERYTHING is direct deposit and withdrawal; he  swipes a Card for groceries and "has to watch when I swipe sometimes". Pt lives independently in his apartment and has a car though he says "I really don't drive anywhere" -- recommend this be discussed w/ Neurology/MD and family/pt.  Recommend f/u w/ Neurology for Formal Cognitive assessment in setting of MRI results and report of memory deline in recent months. He would benefit from skilled ST f/u at next venue of care/in the home to determine level of functioning and strength/weakness/needs for safety and during ADLs. MD and NSG updated.    SLP Assessment  SLP Recommendation/Assessment: All further Speech Lanaguage Pathology  needs can be addressed in the next venue of care SLP Visit Diagnosis: Cognitive communication deficit (R41.841)    Recommendations for follow up therapy are one component of a multi-disciplinary discharge planning process, led by the attending physician.  Recommendations may be updated based on patient status, additional functional criteria and insurance authorization.    Follow Up Recommendations  Outpatient SLP (TBD vs Home Health per MD recommendations)    Assistance Recommended at Discharge  PRN (d/t living alone and potential safety concerns)  Functional Status Assessment Patient has had a recent decline in their functional status and/or demonstrates limited ability to make significant improvements in function in a reasonable and predictable amount of time (baseline decline)  Frequency and Duration  (n/a)   (n/a)      SLP Evaluation Cognition  Overall Cognitive Status: History of cognitive impairments - at baseline ("memory problems", per pt report. No family present. Difficult to assess.) Arousal/Alertness: Awake/alert Orientation Level: Oriented X4 Year: 2023 Month:  (Christmas) Attention: Focused;Sustained Focused Attention: Appears intact Sustained Attention: Appears intact Memory: Impaired Memory Impairment: Decreased recall of new  information Awareness: Impaired Awareness Impairment: Anticipatory impairment (has car; driving "but i don't really go anywhgere") Problem Solving: Impaired ("just call my Sister who's in town" when asked about emergency situations) Problem Solving Impairment: Verbal basic;Functional basic (w/ cues given) Executive Function: Reasoning;Organizing;Decision Making Reasoning: Impaired Reasoning Impairment: Verbal basic;Functional basic Organizing: Impaired Organizing Impairment: Verbal basic;Functional basic Decision Making: Impaired Decision Making Impairment: Verbal basic;Functional basic Behaviors: Restless (tapping fingers) Safety/Judgment: Impaired       Comprehension  Auditory Comprehension Overall Auditory Comprehension: Appears within functional limits for tasks assessed Yes/No Questions: Within Functional Limits (basic) Commands: Within Functional Limits (1-step) Conversation: Simple Visual Recognition/Discrimination Discrimination: Not tested Reading Comprehension Reading Status: Not tested    Expression Expression Primary Mode of Expression: Verbal Verbal Expression Overall Verbal Expression: Appears within functional limits for tasks assessed (general conversation) Initiation: No impairment Automatic Speech:  (WFL) Level of Generative/Spontaneous Verbalization: Sales executive (basic) Repetition: No impairment Naming: No impairment Written Expression Dominant Hand: Right Written Expression: Not tested   Oral / Motor  Oral Motor/Sensory Function Overall Oral Motor/Sensory Function: Within functional limits (no unilateral lingual/labial weakness noted) Motor Speech Overall Motor Speech: Appears within functional limits for tasks assessed Respiration: Within functional limits Phonation: Normal Resonance: Within functional limits (vocal quality felt to be min gravely -- noted h/o nasopharyngeal ca) Articulation: Within functional limitis Intelligibility:  Intelligible Motor Planning: Witnin functional limits Motor Speech Errors: Not applicable               Orinda Kenner, MS, CCC-SLP Speech Language Pathologist Rehab Services; [place] [phone] (ascom) Kortland Nichols 05/04/2022, 10:05 AM

## 2022-05-04 NOTE — Evaluation (Signed)
Occupational Therapy Evaluation Patient Details Name: [name] MRN: [id number] DOB: 06-30-1950 Today's Date: 05/04/2022   History of Present Illness Patient is a 71 year old male who presents to ED due to difficulty speaking and confusion. Pmh includes alcohol abuse, benzodiazepine dependence/withdrawal, COPD, depression, hepatitis c, HTN, nasopharyngeal cancer, seizures, sleep apnea, syncope   Clinical Impression   Patient presenting with decreased independence in self care, balance, functional mobility/transfers, endurance, and safety awareness. Prior to admission pt lived alone, was independent for ADLs/IADLs (occasional assistance for shopping), and independent for functional mobility (occasional SPC or RW use). Pt currently functioning at Mod for bed mobility, Min guard to stand from stretcher, and Min guard for functional mobility at room level using RW. Pt required VC for safety awareness throughout session. Pt will benefit from acute OT to increase overall independence in the areas of ADLs and functional mobility in order to safely discharge home. Pt could benefit from [facility] following D/C to decrease falls risk, improve balance, and maximize independence in self-care within own home environment.     Recommendations for follow up therapy are one component of a multi-disciplinary discharge planning process, led by the attending physician.  Recommendations may be updated based on patient status, additional functional criteria and insurance authorization.   Follow Up Recommendations  Home health OT     Assistance Recommended at Discharge Intermittent Supervision/Assistance  Patient can return home with the following A little help with walking and/or transfers;A little help with bathing/dressing/bathroom;Assistance with cooking/housework;Assist for transportation;Help with stairs or ramp for entrance;Direct supervision/assist for medications management;Direct supervision/assist  for financial management    Functional Status Assessment  Patient has had a recent decline in their functional status and demonstrates the ability to make significant improvements in function in a reasonable and predictable amount of time.  Equipment Recommendations  None recommended by OT    Recommendations for Other Services       Precautions / Restrictions Precautions Precautions: Fall Restrictions Weight Bearing Restrictions: No      Mobility Bed Mobility Overal bed mobility: Modified Independent             General bed mobility comments: for supine<>sit    Transfers Overall transfer level: Needs assistance Equipment used: Rolling walker (2 wheels) Transfers: Sit to/from Stand Sit to Stand: Min guard           General transfer comment: STS from stretcher      Balance Overall balance assessment: Needs assistance Sitting-balance support: No upper extremity supported, Feet supported Sitting balance-Leahy Scale: Good     Standing balance support: Bilateral upper extremity supported, During functional activity Standing balance-Leahy Scale: Fair Standing balance comment: occasional posterior trunk lean when distracted                           ADL either performed or assessed with clinical judgement   ADL Overall ADL's : Needs assistance/impaired     Grooming: Set up;Sitting;Supervision/safety               Lower Body Dressing: Minimal assistance;Sit to/from stand   Toilet Transfer: Rolling walker (2 wheels);Cueing for safety;Min guard Toilet Transfer Details (indicate cue type and reason): simulated with STS from stretcher         Functional mobility during ADLs: Min guard;Rolling walker (2 wheels);Cueing for safety (at room level)       Vision Baseline Vision/History: 1 Wears glasses (reading only) Patient Visual Report: No change from  baseline       Perception     Praxis      Pertinent Vitals/Pain Pain  Assessment Pain Assessment: No/denies pain     Hand Dominance Right   Extremity/Trunk Assessment Upper Extremity Assessment Upper Extremity Assessment: Overall WFL for tasks assessed   Lower Extremity Assessment Lower Extremity Assessment: Generalized weakness       Communication Communication Communication: No difficulties   Cognition Arousal/Alertness: Awake/alert Behavior During Therapy: WFL for tasks assessed/performed Overall Cognitive Status: History of cognitive impairments - at baseline                                 General Comments: oriented to self, location, and date. Needs occasional re-orientation to task, cues for scanning room     General Comments  on RA throughout, unable to get accurate SpO2 reading on monitor. 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    Exercises Other Exercises Other Exercises: OT provided education re: role of OT, OT POC, post acute recs, sitting up for all meals, EOB/OOB mobility with assistance, home/fall safety.     Shoulder Instructions      Home Living Family/patient expects to be discharged to:: Private residence Living Arrangements: Alone Available Help at Discharge: Available PRN/intermittently;Friend(s);Neighbor (friends live next door) Type of Home: Apartment Home Access: Elevator     Home Layout: One level     Bathroom Shower/Tub: Walk-in Psychologist, prison and probation services: Standard     Home Equipment: Conservation officer, nature (2 wheels);Cane - single point;Rollator (4 wheels);Tub bench      Lives With: Alone    Prior Functioning/Environment Prior Level of Function : Independent/Modified Independent;Driving;History of Falls (last six months)             Mobility Comments: Uses walker and cane occasionally. ADLs Comments: IND ADLs, sometimes needs help with shopping, IND medication management using pill box, still driving        OT Problem List: Decreased strength;Decreased activity tolerance;Decreased  knowledge of use of DME or AE;Decreased knowledge of precautions;Decreased safety awareness;Cardiopulmonary status limiting activity;Decreased cognition;Impaired sensation      OT Treatment/Interventions: Self-care/ADL training;Therapeutic exercise;Therapeutic activities;Energy conservation;DME and/or AE instruction;Patient/family education;Balance training    OT Goals(Current goals can be found in the care plan section) Acute Rehab OT Goals Patient Stated Goal: return home OT Goal Formulation: With patient Time For Goal Achievement: 05/18/22 Potential to Achieve Goals: Fair   OT Frequency: Min 2X/week    Co-evaluation              AM-PAC OT "6 Clicks" Daily Activity     Outcome Measure Help from another person eating meals?: None Help from another person taking care of personal grooming?: A Little Help from another person toileting, which includes using toliet, bedpan, or urinal?: A Little Help from another person bathing (including washing, rinsing, drying)?: A Little Help from another person to put on and taking off regular upper body clothing?: None Help from another person to put on and taking off regular lower body clothing?: A Little 6 Click Score: 20   End of Session Equipment Utilized During Treatment: Gait belt;Rolling walker (2 wheels) Nurse Communication: Mobility status  Activity Tolerance: Patient tolerated treatment well Patient left: in bed;with call bell/phone within reach;with bed alarm set  OT Visit Diagnosis: Unsteadiness on feet (R26.81);Muscle weakness (generalized) (M62.81)                Time: 2956-2130 OT  Time Calculation (min): 13 min Charges:  OT General Charges $OT Visit: 1 Visit OT Evaluation $OT Eval Moderate Complexity: 1 Mod  [facility] MS, OTR/L ascom [phone]  05/04/22, 12:17 PM

## 2022-05-09 ENCOUNTER — Encounter: Payer: Self-pay | Admitting: Internal Medicine

## 2022-05-09 ENCOUNTER — Ambulatory Visit (INDEPENDENT_AMBULATORY_CARE_PROVIDER_SITE_OTHER): Payer: Medicare HMO | Admitting: Internal Medicine

## 2022-05-09 VITALS — BP 138/78 | HR 93 | Temp 97.6°F | Resp 16 | Ht 69.0 in | Wt 229.0 lb

## 2022-05-09 DIAGNOSIS — Z9189 Other specified personal risk factors, not elsewhere classified: Secondary | ICD-10-CM | POA: Diagnosis not present

## 2022-05-09 DIAGNOSIS — R0602 Shortness of breath: Secondary | ICD-10-CM

## 2022-05-09 DIAGNOSIS — G894 Chronic pain syndrome: Secondary | ICD-10-CM

## 2022-05-09 DIAGNOSIS — G9340 Encephalopathy, unspecified: Secondary | ICD-10-CM

## 2022-05-09 DIAGNOSIS — I700 Atherosclerosis of aorta: Secondary | ICD-10-CM | POA: Diagnosis not present

## 2022-05-09 MED ORDER — TRELEGY ELLIPTA 100-62.5-25 MCG/ACT IN AEPB: 1.0000 | INHALATION_SPRAY | Freq: Every day | RESPIRATORY_TRACT | 11 refills | Status: DC

## 2022-05-09 MED ORDER — COMBIVENT RESPIMAT 20-100 MCG/ACT IN AERS: 1.0000 | INHALATION_SPRAY | Freq: Four times a day (QID) | RESPIRATORY_TRACT | 3 refills | Status: DC | PRN

## 2022-05-09 NOTE — Progress Notes (Signed)
[facility] [place], [place] [address]  Internal MEDICINE  Office Visit Note  Patient Name: [name]  [id number]  [id number]  Date of Service: 05/21/2022  TCM    Chief Complaint  Patient presents with   Hospitalization Follow-up    Early dementia and increased anxiety   COPD   Quality Metric Gaps    TDAP, Pneumonia and Shingles     HPI Pt is here for recent hospital follow up.all chart is reviewed  TREMEL SETTERS IV is a 71 y.o. male with history of hypertension, hepatitis C, depression, alcohol and polysubstance abuse, and seizures who presents with aphasia and confusion.  The patient's roommate first noted that he seemed confused around 10 AM today and then the aphasia started shortly afterwards.  Per the sister, the patient had at least one prior episode like this that lasted a few hours about a week or 2 ago.  The patient denies headache or other acute pain.  He is unable to provide much other history due to dysarthria.   Pt also has history of alcohol drinking, benzodiazepine use, COPD, type 2 diabetes, essential hypertension, hepatitis C, history of seizure disorder who presents to the hospital with disorientation and difficulty speaking. MRI brain showed evidence of small vessel disease and atrophy.  No acute stroke.  Patient is seen by speech therapy, passed speech eval.  But has evidence of dementia.  Patient was monitored overnight, mental status improved today.  Patient has no longer has any disorientation or confusion.  Speech disturbance has resolved.  At this point, patient is medically stable to be discharged. Patient also has history of COPD, he does not have worsening shortness of breath.  However, patient was seen by physical therapy, oxygen saturations dropped down to 82% with ambulation.  Patient does not have COPD exacerbation, he has a chronic hypoxemia, which could be the source altered mental status.  Will provide  home oxygen. Current Medication: Outpatient Encounter Medications as of 05/09/2022  Medication Sig   amitriptyline (ELAVIL) 75 MG tablet Take one tab po qhs for insomnia (Patient taking differently: Take 75 mg by mouth at bedtime. Take one tab po qhs for insomnia)   amLODipine (NORVASC) 5 MG tablet TAKE 1 TABLET BY MOUTH DAILY   ammonium lactate (AMLACTIN) 12 % lotion Apply 1 application. topically as needed for dry skin.   antiseptic oral rinse (BIOTENE) LIQD 15 mLs by Mouth Rinse route as needed for dry mouth.   aspirin EC 81 MG tablet Take 81 mg by mouth daily. Swallow whole.   gabapentin (NEURONTIN) 600 MG tablet TAKE 1 TABLET BY MOUTH EVERY MORNING, 1 TABLET AT NOON, 1 TABLET IN THE EVENING AND 1 TABLET AT BEDTIME AS DIRECTED. *NOTE DOSEAGE CHANGE*   hydrOXYzine (VISTARIL) 25 MG capsule TAKE 1 CAPSULE BY MOUTH EVERY day as needed for anxiety (Patient taking differently: Take 25 mg by mouth daily as needed for anxiety. TAKE 1 CAPSULE BY MOUTH EVERY day as needed for anxiety)   ipratropium-albuterol (DUONEB) 0.5-2.5 (3) MG/3ML SOLN Take 3 mLs by nebulization every 4 (four) hours as needed.   pilocarpine (SALAGEN) 5 MG tablet Take 1 tablet (5 mg total) by mouth 2 (two) times daily.   revefenacin (YUPELRI) 175 MCG/3ML nebulizer solution Take 3 mLs (175 mcg total) by nebulization daily.   rosuvastatin (CRESTOR) 5 MG tablet TAKE 1 TABLET BY MOUTH DAILY. (Patient taking differently: Take 5 mg by mouth daily.)   [DISCONTINUED] buprenorphine (BUTRANS) 15  MCG/HR Place 1 patch onto the skin once a week.   [DISCONTINUED] chlorpheniramine-HYDROcodone (TUSSIONEX) 10-8 MG/5ML Take 5 mLs by mouth every 12 (twelve) hours as needed for cough.   [DISCONTINUED] Fluticasone-Umeclidin-Vilant (TRELEGY ELLIPTA) 100-62.5-25 MCG/ACT AEPB Inhale 1 puff into the lungs daily.   [DISCONTINUED] Ipratropium-Albuterol (COMBIVENT RESPIMAT) 20-100 MCG/ACT AERS respimat Inhale 1 puff into the lungs every 6 (six) hours as needed.  Rescue inhaler   [DISCONTINUED] omeprazole (PRILOSEC) 40 MG capsule TAKE 1 CAPSULE BY MOUTH DAILY   [DISCONTINUED] tiZANidine (ZANAFLEX) 4 MG tablet TAKE 1 TABLET BY MOUTH EVERY 6 HOURS AS NEEDED FOR MUSCLE SPASMS   Fluticasone-Umeclidin-Vilant (TRELEGY ELLIPTA) 100-62.5-25 MCG/ACT AEPB Inhale 1 puff into the lungs daily.   Ipratropium-Albuterol (COMBIVENT RESPIMAT) 20-100 MCG/ACT AERS respimat Inhale 1 puff into the lungs every 6 (six) hours as needed. Rescue inhaler   Facility-Administered Encounter Medications as of 05/09/2022  Medication   heparin lock flush 100 unit/mL    Surgical History: Past Surgical History:  Procedure Laterality Date   COLONOSCOPY WITH PROPOFOL N/A 12/16/2016   Procedure: COLONOSCOPY WITH PROPOFOL;  Surgeon: Lollie Sails, MD;  Location: [facility] ENDOSCOPY;  Service: Endoscopy;  Laterality: N/A;   COLONOSCOPY WITH PROPOFOL N/A 03/14/2017   Procedure: COLONOSCOPY WITH PROPOFOL;  Surgeon: Lollie Sails, MD;  Location: [facility] ENDOSCOPY;  Service: Endoscopy;  Laterality: N/A;   COLONOSCOPY WITH PROPOFOL     FOOT SURGERY Right 03/12/2002   FOOT SURGERY     HEMORRHOID SURGERY     LITHOTRIPSY     MYRINGOTOMY     MYRINGOTOMY WITH TUBE PLACEMENT Left 04/28/2019   Procedure: MYRINGOTOMY WITH TUBE PLACEMENT;  Surgeon: Carloyn Manner, MD;  Location: [place];  Service: ENT;  Laterality: Left;   NASOPHARYNGOSCOPY N/A 04/28/2019   Procedure: NASOPHARYNGOSCOPY WITH BIOPSY;  Surgeon: Carloyn Manner, MD;  Location: [place];  Service: ENT;  Laterality: N/A;   NASOPHARYNGOSCOPY     Port a cath Placement     PORTA CATH INSERTION N/A 05/20/2019   Procedure: PORTA CATH INSERTION;  Surgeon: Algernon Huxley, MD;  Location: [facility];  Service: Cardiovascular;  Laterality: N/A;   PORTA CATH REMOVAL N/A 11/29/2020   Procedure: PORTA CATH REMOVAL;  Surgeon: Algernon Huxley, MD;  Location: [place] [facility];  Service: Cardiovascular;  Laterality:  N/A;   TONSILLECTOMY      Medical History: Past Medical History:  Diagnosis Date   Alcohol abuse    Benzodiazepine dependence ([place])    Benzodiazepine withdrawal ([facility])    Chronic pain in right foot    COPD (chronic obstructive pulmonary disease) ([place])    Depression 08/19/2017   Hepatitis C    Hypertension    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[place])    Seizures ([place]) 2010   Sleep apnea    Syncope    questionable vasovagal    Family History: Family History  Problem Relation Age of Onset   Arthritis Mother    Hypertension Mother    Cancer Father    Kidney disease Father    Alcohol abuse Paternal Aunt    Depression Maternal Grandfather     Social History   Socioeconomic History   Marital status: Divorced    Spouse name: Not on file   Number of children: Not on file   Years of education: Not on file   Highest education level: Not on file  Occupational History   Not on file  Tobacco Use   Smoking status: Former    Packs/day:  0.50    Years: 50.00    Total pack years: 25.00    Types: Cigarettes    Quit date: 02/05/2019    Years since quitting: 3.2    Passive exposure: Past   Smokeless tobacco: Never   Tobacco comments:    4-5 cigs a day (02/04/22)    Quit 1 weeks ago.   Vaping Use   Vaping Use: Never used  Substance and Sexual Activity   Alcohol use: Not Currently    Alcohol/week: 14.0 standard drinks of alcohol    Types: 14 Cans of beer per week   Drug use: Not Currently   Sexual activity: Not Currently  Other Topics Concern   Not on file  Social History Narrative   ** Merged History Encounter **       Social Determinants of Health   Financial Resource Strain: Not on file  Food Insecurity: No Food Insecurity (05/27/2019)   Hunger Vital Sign    Worried About Running Out of Food in the Last Year: Never true    Ran Out of Food in the Last Year: Never true  Transportation Needs: No Transportation Needs (05/27/2019)   PRAPARE - Hydrologist  (Medical): No    Lack of Transportation (Non-Medical): No  Physical Activity: Inactive (05/27/2019)   Exercise Vital Sign    Days of Exercise per Week: 0 days    Minutes of Exercise per Session: 0 min  Stress: Not on file  Social Connections: Not on file  Intimate Partner Violence: Not At Risk (05/27/2019)   Humiliation, Afraid, Rape, and Kick questionnaire    Fear of Current or Ex-Partner: No    Emotionally Abused: No    Physically Abused: No    Sexually Abused: No      Review of Systems  Constitutional:  Negative for fatigue and fever.  HENT:  Negative for congestion, mouth sores and postnasal drip.   Respiratory:  Positive for shortness of breath. Negative for cough.   Cardiovascular:  Negative for chest pain.  Genitourinary:  Negative for flank pain.  Psychiatric/Behavioral: Negative.      Vital Signs: BP 138/78   Pulse 93   Temp 97.6 F (36.4 C)   Resp 16   Ht '5\' 9"'$  (1.753 m)   Wt 229 lb (103.9 kg)   SpO2 98%   BMI 33.82 kg/m    Physical Exam Constitutional:      Appearance: Normal appearance.  HENT:     Head: Normocephalic and atraumatic.     Nose: Nose normal.     Mouth/Throat:     Mouth: Mucous membranes are moist.     Pharynx: No posterior oropharyngeal erythema.  Eyes:     Extraocular Movements: Extraocular movements intact.     Pupils: Pupils are equal, round, and reactive to light.  Cardiovascular:     Pulses: Normal pulses.     Heart sounds: Normal heart sounds.  Pulmonary:     Effort: Pulmonary effort is normal.     Breath sounds: Normal breath sounds.  Neurological:     General: No focal deficit present.     Mental Status: He is alert.  Psychiatric:        Mood and Affect: Mood normal.        Behavior: Behavior normal.       Assessment/Plan: 1. Acute encephalopathy This seems to be resolved the patient is awake and alert oriented x 3 in the office  2. Shortness of breath  Patient was found to be hypoxic in the hospital however his  cardiopulmonary stress test in the office did not show any desaturations patient maintained oxygen saturation the whole time - 6 minute walk  3. Aortic atherosclerosis ([place]) Continue to Crestor as before  4. Chronic pain syndrome Patient is taking multiple medications followed by chronic pain management will we will monitor her on defer to prescribe any CNS depressant at this time  5. At risk for polypharmacy Patient is at risk for polypharmacy he has multidrug abuse in the past including alcohol we will be cautious giving her any CNS depressant because that can increase risk of encephalopathy  General Counseling: Jhonnie Garner understanding of the findings of todays visit and agrees with plan of treatment. I have discussed any further diagnostic evaluation that may be needed or ordered today. We also reviewed his medications today. he has been encouraged to call the office with any questions or concerns that should arise related to todays visit.    Counseling:  [place] Controlled Substance Database was reviewed by me.  Orders Placed This Encounter  Procedures   6 minute walk      I have reviewed all medical records from hospital follow up including radiology reports and consults from other physicians. Appropriate follow up diagnostics will be scheduled as needed. Patient/ Family understands the plan of treatment. Time spent 45 minutes.   Dr Lavera Guise, MD Internal Medicine

## 2022-05-14 ENCOUNTER — Encounter: Payer: Self-pay | Admitting: Nurse Practitioner

## 2022-05-14 ENCOUNTER — Ambulatory Visit (INDEPENDENT_AMBULATORY_CARE_PROVIDER_SITE_OTHER): Payer: Medicare HMO | Admitting: Nurse Practitioner

## 2022-05-14 DIAGNOSIS — K219 Gastro-esophageal reflux disease without esophagitis: Secondary | ICD-10-CM

## 2022-05-14 MED ORDER — OMEPRAZOLE 40 MG PO CPDR: 40.0000 mg | DELAYED_RELEASE_CAPSULE | Freq: Every day | ORAL | 0 refills | Status: DC

## 2022-05-14 NOTE — Progress Notes (Signed)
[facility] [place], [place] [address]  Internal MEDICINE  Office Visit Note  Patient Name: [name]  [id number]  [id number]  Date of Service: 05/14/2022  Chief Complaint  Patient presents with   Follow-up   Depression   Hypertension    HPI [name] presents for a follow up visit for hypertension, depression and routine follow up  --refill of omeprazole --recent ED visit for aphasia and altered mental status with follow up visit on 05/09/22 with Dr. Clayborn West.  --reports that he feels pretty good today now that his breathing is improved and his mind is clear. --has upcoming office visit with new PCP at [facility] on 05/28/21.    Current Medication: Outpatient Encounter Medications as of 05/14/2022  Medication Sig   amitriptyline (ELAVIL) 75 MG tablet Take one tab po qhs for insomnia (Patient taking differently: Take 75 mg by mouth at bedtime. Take one tab po qhs for insomnia)   amLODipine (NORVASC) 5 MG tablet TAKE 1 TABLET BY MOUTH DAILY   ammonium lactate (AMLACTIN) 12 % lotion Apply 1 application. topically as needed for dry skin.   antiseptic oral rinse (BIOTENE) LIQD 15 mLs by Mouth Rinse route as needed for dry mouth.   aspirin EC 81 MG tablet Take 81 mg by mouth daily. Swallow whole.   buprenorphine (BUTRANS) 15 MCG/HR Place 1 patch onto the skin once a week.   Fluticasone-Umeclidin-Vilant (TRELEGY ELLIPTA) 100-62.5-25 MCG/ACT AEPB Inhale 1 puff into the lungs daily.   gabapentin (NEURONTIN) 600 MG tablet TAKE 1 TABLET BY MOUTH EVERY MORNING, 1 TABLET AT NOON, 1 TABLET IN THE EVENING AND 1 TABLET AT BEDTIME AS DIRECTED. *NOTE DOSEAGE CHANGE*   hydrOXYzine (VISTARIL) 25 MG capsule TAKE 1 CAPSULE BY MOUTH EVERY day as needed for anxiety (Patient taking differently: Take 25 mg by mouth daily as needed for anxiety. TAKE 1 CAPSULE BY MOUTH EVERY day as needed for anxiety)   Ipratropium-Albuterol (COMBIVENT RESPIMAT) 20-100  MCG/ACT AERS respimat Inhale 1 puff into the lungs every 6 (six) hours as needed. Rescue inhaler   ipratropium-albuterol (DUONEB) 0.5-2.5 (3) MG/3ML SOLN Take 3 mLs by nebulization every 4 (four) hours as needed.   pilocarpine (SALAGEN) 5 MG tablet Take 1 tablet (5 mg total) by mouth 2 (two) times daily.   revefenacin (YUPELRI) 175 MCG/3ML nebulizer solution Take 3 mLs (175 mcg total) by nebulization daily.   rosuvastatin (CRESTOR) 5 MG tablet TAKE 1 TABLET BY MOUTH DAILY. (Patient taking differently: Take 5 mg by mouth daily.)   tiZANidine (ZANAFLEX) 4 MG tablet TAKE 1 TABLET BY MOUTH EVERY 6 HOURS AS NEEDED FOR MUSCLE SPASMS   [DISCONTINUED] omeprazole (PRILOSEC) 40 MG capsule TAKE 1 CAPSULE BY MOUTH DAILY   omeprazole (PRILOSEC) 40 MG capsule Take 1 capsule (40 mg total) by mouth daily.   Facility-Administered Encounter Medications as of 05/14/2022  Medication   heparin lock flush 100 unit/mL    Surgical History: Past Surgical History:  Procedure Laterality Date   COLONOSCOPY WITH PROPOFOL N/A 12/16/2016   Procedure: COLONOSCOPY WITH PROPOFOL;  Surgeon: Lollie Sails, MD;  Location: [facility] ENDOSCOPY;  Service: Endoscopy;  Laterality: N/A;   COLONOSCOPY WITH PROPOFOL N/A 03/14/2017   Procedure: COLONOSCOPY WITH PROPOFOL;  Surgeon: Lollie Sails, MD;  Location: [facility] ENDOSCOPY;  Service: Endoscopy;  Laterality: N/A;   COLONOSCOPY WITH PROPOFOL     FOOT SURGERY Right 03/12/2002   FOOT SURGERY     HEMORRHOID SURGERY  LITHOTRIPSY     MYRINGOTOMY     MYRINGOTOMY WITH TUBE PLACEMENT Left 04/28/2019   Procedure: MYRINGOTOMY WITH TUBE PLACEMENT;  Surgeon: Carloyn Manner, MD;  Location: [facility];  Service: ENT;  Laterality: Left;   NASOPHARYNGOSCOPY N/A 04/28/2019   Procedure: NASOPHARYNGOSCOPY WITH BIOPSY;  Surgeon: Carloyn Manner, MD;  Location: [place];  Service: ENT;  Laterality: N/A;   NASOPHARYNGOSCOPY     Port a cath Placement     PORTA CATH  INSERTION N/A 05/20/2019   Procedure: PORTA CATH INSERTION;  Surgeon: Algernon Huxley, MD;  Location: [place] [facility];  Service: Cardiovascular;  Laterality: N/A;   PORTA CATH REMOVAL N/A 11/29/2020   Procedure: PORTA CATH REMOVAL;  Surgeon: Algernon Huxley, MD;  Location: [place] [facility];  Service: Cardiovascular;  Laterality: N/A;   TONSILLECTOMY      Medical History: Past Medical History:  Diagnosis Date   Alcohol abuse    Benzodiazepine dependence ([place])    Benzodiazepine withdrawal ([facility])    Chronic pain in right foot    COPD (chronic obstructive pulmonary disease) ([place])    Depression 08/19/2017   Hepatitis C    Hypertension    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[place])    Seizures ([place]) 2010   Sleep apnea    Syncope    questionable vasovagal    Family History: Family History  Problem Relation Age of Onset   Arthritis Mother    Hypertension Mother    Cancer Father    Kidney disease Father    Alcohol abuse Paternal Aunt    Depression Maternal Grandfather     Social History   Socioeconomic History   Marital status: Divorced    Spouse name: Not on file   Number of children: Not on file   Years of education: Not on file   Highest education level: Not on file  Occupational History   Not on file  Tobacco Use   Smoking status: Former    Packs/day: 0.50    Years: 50.00    Total pack years: 25.00    Types: Cigarettes    Quit date: 02/05/2019    Years since quitting: 3.2    Passive exposure: Past   Smokeless tobacco: Never   Tobacco comments:    4-5 cigs a day (02/04/22)    Quit 1 weeks ago.   Vaping Use   Vaping Use: Never used  Substance and Sexual Activity   Alcohol use: Not Currently    Alcohol/week: 14.0 standard drinks of alcohol    Types: 14 Cans of beer per week   Drug use: Not Currently   Sexual activity: Not Currently  Other Topics Concern   Not on file  Social History Narrative   ** Merged History Encounter **       Social Determinants of Health    Financial Resource Strain: Not on file  Food Insecurity: No Food Insecurity (05/27/2019)   Hunger Vital Sign    Worried About Running Out of Food in the Last Year: Never true    Ran Out of Food in the Last Year: Never true  Transportation Needs: No Transportation Needs (05/27/2019)   PRAPARE - Hydrologist (Medical): No    Lack of Transportation (Non-Medical): No  Physical Activity: Inactive (05/27/2019)   Exercise Vital Sign    Days of Exercise per Week: 0 days    Minutes of Exercise per Session: 0 min  Stress: Not on file  Social Connections:  Not on file  Intimate Partner Violence: Not At Risk (05/27/2019)   Humiliation, Afraid, Rape, and Kick questionnaire    Fear of Current or Ex-Partner: No    Emotionally Abused: No    Physically Abused: No    Sexually Abused: No      Review of Systems  Constitutional: Negative.  Negative for chills, fatigue and unexpected weight change.  HENT: Negative.  Negative for congestion, postnasal drip, rhinorrhea, sneezing and sore throat.   Eyes:  Negative for redness.  Respiratory: Negative.  Negative for cough, chest tightness and shortness of breath.   Cardiovascular: Negative.  Negative for chest pain and palpitations.  Gastrointestinal:  Negative for abdominal pain, constipation, diarrhea, nausea and vomiting.  Genitourinary: Negative.  Negative for dysuria and frequency.  Musculoskeletal: Negative.  Negative for arthralgias, back pain, joint swelling and neck pain.  Skin:  Negative for rash.  Neurological: Negative.  Negative for tremors and numbness.  Hematological:  Negative for adenopathy. Does not bruise/bleed easily.  Psychiatric/Behavioral:  Negative for behavioral problems (Depression), sleep disturbance and suicidal ideas. The patient is not nervous/anxious.     Vital Signs: BP (!) 151/82   Pulse 83   Temp 98.1 F (36.7 C)   Resp 16   Ht '5\' 9"'$  (1.753 m)   Wt 230 lb 12.8 oz (104.7 kg)   SpO2 98%    BMI 34.08 kg/m    Physical Exam Vitals reviewed.  Constitutional:      General: He is not in acute distress.    Appearance: Normal appearance. He is obese. He is not ill-appearing.  HENT:     Head: Normocephalic and atraumatic.  Eyes:     Pupils: Pupils are equal, round, and reactive to light.  Cardiovascular:     Rate and Rhythm: Normal rate and regular rhythm.  Neurological:     Mental Status: He is alert and oriented to person, place, and time.  Psychiatric:        Mood and Affect: Mood normal.        Behavior: Behavior normal.        Assessment/Plan: 1. Gastroesophageal reflux disease without esophagitis Stable, continue omeprazole as prescribed.  - omeprazole (PRILOSEC) 40 MG capsule; Take 1 capsule (40 mg total) by mouth daily.  Dispense: 30 capsule; Refill: 0   General Counseling: Hulon verbalizes understanding of the findings of todays visit and agrees with plan of treatment. I have discussed any further diagnostic evaluation that may be needed or ordered today. We also reviewed his medications today. he has been encouraged to call the office with any questions or concerns that should arise related to todays visit.    No orders of the defined types were placed in this encounter.   Meds ordered this encounter  Medications   omeprazole (PRILOSEC) 40 MG capsule    Sig: Take 1 capsule (40 mg total) by mouth daily.    Dispense:  30 capsule    Refill:  0    Return for no F/U at this time, patient will call if decides to stay with [facility] as PCP.   Total time spent:20 Minutes Time spent includes review of chart, medications, test results, and follow up plan with the patient.   [place] Controlled Substance Database was reviewed by me.  This patient was seen by Jonetta Osgood, FNP-C in collaboration with Dr. Clayborn West as a part of collaborative care agreement.   Janyce Ellinger R. Valetta Fuller, MSN, FNP-C Internal medicine

## 2022-05-17 ENCOUNTER — Inpatient Hospital Stay (HOSPITAL_BASED_OUTPATIENT_CLINIC_OR_DEPARTMENT_OTHER): Payer: Medicare HMO | Admitting: Oncology

## 2022-05-17 ENCOUNTER — Inpatient Hospital Stay: Payer: Medicare HMO | Attending: Oncology

## 2022-05-17 ENCOUNTER — Encounter: Payer: Self-pay | Admitting: Oncology

## 2022-05-17 VITALS — BP 128/98 | HR 90 | Temp 97.4°F | Resp 18 | Wt 230.0 lb

## 2022-05-17 DIAGNOSIS — Z85818 Personal history of malignant neoplasm of other sites of lip, oral cavity, and pharynx: Secondary | ICD-10-CM | POA: Insufficient documentation

## 2022-05-17 DIAGNOSIS — Z9221 Personal history of antineoplastic chemotherapy: Secondary | ICD-10-CM | POA: Diagnosis not present

## 2022-05-17 DIAGNOSIS — Z87891 Personal history of nicotine dependence: Secondary | ICD-10-CM | POA: Insufficient documentation

## 2022-05-17 DIAGNOSIS — I10 Essential (primary) hypertension: Secondary | ICD-10-CM | POA: Insufficient documentation

## 2022-05-17 DIAGNOSIS — Z08 Encounter for follow-up examination after completed treatment for malignant neoplasm: Secondary | ICD-10-CM

## 2022-05-17 DIAGNOSIS — Z8589 Personal history of malignant neoplasm of other organs and systems: Secondary | ICD-10-CM

## 2022-05-17 DIAGNOSIS — Z7982 Long term (current) use of aspirin: Secondary | ICD-10-CM | POA: Insufficient documentation

## 2022-05-17 DIAGNOSIS — Z79899 Other long term (current) drug therapy: Secondary | ICD-10-CM | POA: Diagnosis not present

## 2022-05-17 DIAGNOSIS — Z923 Personal history of irradiation: Secondary | ICD-10-CM | POA: Insufficient documentation

## 2022-05-17 DIAGNOSIS — C119 Malignant neoplasm of nasopharynx, unspecified: Secondary | ICD-10-CM

## 2022-05-17 LAB — CBC WITH DIFFERENTIAL/PLATELET
Abs Immature Granulocytes: 0.04 10*3/uL (ref 0.00–0.07)
Basophils Absolute: 0 10*3/uL (ref 0.0–0.1)
Basophils Relative: 0 %
Eosinophils Absolute: 0.2 10*3/uL (ref 0.0–0.5)
Eosinophils Relative: 3 %
HCT: 37.7 % — ABNORMAL LOW (ref 39.0–52.0)
Hemoglobin: 12.6 g/dL — ABNORMAL LOW (ref 13.0–17.0)
Immature Granulocytes: 1 %
Lymphocytes Relative: 17 %
Lymphs Abs: 1.1 10*3/uL (ref 0.7–4.0)
MCH: 29.6 pg (ref 26.0–34.0)
MCHC: 33.4 g/dL (ref 30.0–36.0)
MCV: 88.5 fL (ref 80.0–100.0)
Monocytes Absolute: 0.5 10*3/uL (ref 0.1–1.0)
Monocytes Relative: 8 %
Neutro Abs: 4.9 10*3/uL (ref 1.7–7.7)
Neutrophils Relative %: 71 %
Platelets: 204 10*3/uL (ref 150–400)
RBC: 4.26 MIL/uL (ref 4.22–5.81)
RDW: 13.9 % (ref 11.5–15.5)
WBC: 6.8 10*3/uL (ref 4.0–10.5)
nRBC: 0 % (ref 0.0–0.2)

## 2022-05-17 LAB — COMPREHENSIVE METABOLIC PANEL
ALT: 17 U/L (ref 0–44)
AST: 22 U/L (ref 15–41)
Albumin: 4.1 g/dL (ref 3.5–5.0)
Alkaline Phosphatase: 74 U/L (ref 38–126)
Anion gap: 10 (ref 5–15)
BUN: 10 mg/dL (ref 8–23)
CO2: 29 mmol/L (ref 22–32)
Calcium: 8.9 mg/dL (ref 8.9–10.3)
Chloride: 100 mmol/L (ref 98–111)
Creatinine, Ser: 1.07 mg/dL (ref 0.61–1.24)
GFR, Estimated: >60 mL/min (ref >60)
Glucose, Bld: 125 mg/dL — ABNORMAL HIGH (ref 70–99)
Potassium: 4.7 mmol/L (ref 3.5–5.1)
Sodium: 139 mmol/L (ref 135–145)
Total Bilirubin: 0.3 mg/dL (ref 0.3–1.2)
Total Protein: 8.1 g/dL (ref 6.5–8.1)

## 2022-05-17 NOTE — Progress Notes (Unsigned)
Pt in for follow up, reports doing well but was recently hospitalized for low oxygen levels.

## 2022-05-19 ENCOUNTER — Encounter: Payer: Self-pay | Admitting: Oncology

## 2022-05-19 DIAGNOSIS — J449 Chronic obstructive pulmonary disease, unspecified: Secondary | ICD-10-CM | POA: Diagnosis not present

## 2022-05-19 NOTE — Progress Notes (Signed)
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  Pryor Ochoa for symptoms of ear pain and discharge.  Patient also noticed associated hearing loss and tinnitus.  Patient was noted to have erythema and edema in the left sphenopalatine fossa as well as an exophytic mass on NPL exam.  CT soft tissue of the neck showed asymmetric soft tissue within the left nasopharynx measuring 2.4 x 2 x 3.2 cm.  There is lateral bulging with encroachment upon the left parapharyngeal space without frank infiltration.  No extension into oropharynx or nasal cavity.  No skull base infiltration.  No appreciable mass within the larynx.  No pathologically enlarged cervical lymph nodes.  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  He was only able to receive 6 doses of weekly cisplatin 40 mg due to cytopenias and AKI  Interval history- Patient was admitted to the hospital in December 2023 for disorientation and difficulties speaking.  Stroke was ruled out.  He also briefly required home  oxygen.  Presently he is doing well.  Appetite and weight have remained stable.  Denies any difficulty swallowing or headaches.  ECOG PS- 1 Pain scale- 0  Review of systems- Review of Systems  Constitutional:  Negative for chills, fever, malaise/fatigue and weight loss.  HENT:  Negative for congestion, ear discharge and nosebleeds.   Eyes:  Negative for blurred vision.  Respiratory:  Negative for cough, hemoptysis, sputum production, shortness of breath and wheezing.   Cardiovascular:  Negative for chest pain, palpitations, orthopnea and claudication.  Gastrointestinal:  Negative for abdominal pain, blood in stool, constipation, diarrhea, heartburn, melena, nausea and vomiting.  Genitourinary:  Negative for dysuria, flank pain, frequency, hematuria and urgency.  Musculoskeletal:  Negative for back pain, joint pain and myalgias.  Skin:  Negative for rash.  Neurological:  Negative for dizziness, tingling, focal weakness, seizures, weakness and headaches.  Endo/Heme/Allergies:  Does not bruise/bleed easily.  Psychiatric/Behavioral:  Negative for depression and suicidal ideas. The patient does not have insomnia.       Allergies  Allergen Reactions   Opana [Oxymorphone Hcl]     Made him BLACKOUT   Elemental Sulfur     Childhood reaction    Oxymorphone Other (See Comments)   Oxymorphone    Sulfa Antibiotics Other (See Comments)   Sulfa Antibiotics      Past Medical History:  Diagnosis Date   Alcohol abuse    Benzodiazepine dependence ([place])    Benzodiazepine withdrawal ([facility])    Chronic pain in right foot    COPD (chronic obstructive pulmonary disease) ([place])    Depression 08/19/2017   Hepatitis C    Hypertension    Nasopharyngeal  cancer ([place])    Seizures ([place]) 2010   Sleep apnea    Syncope    questionable vasovagal     Past Surgical History:  Procedure Laterality Date   COLONOSCOPY WITH PROPOFOL N/A 12/16/2016   Procedure: COLONOSCOPY WITH PROPOFOL;  Surgeon: Lollie Sails, MD;  Location: [facility] ENDOSCOPY;  Service: Endoscopy;  Laterality: N/A;   COLONOSCOPY WITH PROPOFOL N/A 03/14/2017   Procedure: COLONOSCOPY WITH PROPOFOL;  Surgeon: Lollie Sails, MD;  Location: [facility] ENDOSCOPY;  Service: Endoscopy;  Laterality: N/A;   COLONOSCOPY WITH PROPOFOL     FOOT SURGERY Right 03/12/2002   FOOT SURGERY     HEMORRHOID SURGERY     LITHOTRIPSY     MYRINGOTOMY     MYRINGOTOMY WITH TUBE PLACEMENT Left 04/28/2019   Procedure: MYRINGOTOMY WITH TUBE PLACEMENT;  Surgeon: Carloyn Manner, MD;  Location: [place];  Service: ENT;  Laterality: Left;   NASOPHARYNGOSCOPY N/A 04/28/2019   Procedure: NASOPHARYNGOSCOPY WITH BIOPSY;  Surgeon: Carloyn Manner, MD;  Location: [place];  Service: ENT;  Laterality: N/A;   NASOPHARYNGOSCOPY     Port a cath Placement     PORTA CATH INSERTION N/A 05/20/2019   Procedure: PORTA CATH INSERTION;  Surgeon: Algernon Huxley, MD;  Location: [place] CV LAB;  Service: Cardiovascular;  Laterality: N/A;   PORTA CATH REMOVAL N/A 11/29/2020   Procedure: PORTA CATH REMOVAL;  Surgeon: Algernon Huxley, MD;  Location: [place] [facility];  Service: Cardiovascular;  Laterality: N/A;   TONSILLECTOMY      Social History   Socioeconomic History   Marital status: Divorced    Spouse name: Not on file   Number of children: Not on file   Years of education: Not on file   Highest education level: Not on file  Occupational History   Not on file  Tobacco Use   Smoking status: Former    Packs/day: 0.50    Years: 50.00    Total pack years: 25.00    Types: Cigarettes    Quit date: 02/05/2019    Years since quitting: 3.2    Passive exposure: Past   Smokeless tobacco: Never   Tobacco comments:    4-5 cigs a day (02/04/22)    Quit 1 weeks ago.   Vaping Use   Vaping Use: Never used  Substance and Sexual Activity   Alcohol use: Not Currently    Alcohol/week: 14.0 standard drinks of alcohol    Types: 14 Cans of beer per  week   Drug use: Not Currently   Sexual activity: Not Currently  Other Topics Concern   Not on file  Social History Narrative   ** Merged History Encounter **       Social Determinants of Health   Financial Resource Strain: Not on file  Food Insecurity: No Food Insecurity (05/27/2019)   Hunger Vital Sign    Worried About Running Out of Food in the Last Year: Never true    Ran Out of Food in the Last Year: Never true  Transportation Needs: No Transportation Needs (05/27/2019)   PRAPARE - Hydrologist (Medical): No    Lack of Transportation (Non-Medical): No  Physical Activity: Inactive (05/27/2019)   Exercise Vital Sign    Days of Exercise per Week: 0 days    Minutes of Exercise per Session: 0 min  Stress: Not on file  Social Connections: Not on file  Intimate Partner Violence: Not At Risk (05/27/2019)  Humiliation, Afraid, Rape, and Kick questionnaire    Fear of Current or Ex-Partner: No    Emotionally Abused: No    Physically Abused: No    Sexually Abused: No    Family History  Problem Relation Age of Onset   Arthritis Mother    Hypertension Mother    Cancer Father    Kidney disease Father    Alcohol abuse Paternal Aunt    Depression Maternal Grandfather      Current Outpatient Medications:    amitriptyline (ELAVIL) 75 MG tablet, Take one tab po qhs for insomnia (Patient taking differently: Take 75 mg by mouth at bedtime. Take one tab po qhs for insomnia), Disp: 90 tablet, Rfl: 1   amLODipine (NORVASC) 5 MG tablet, TAKE 1 TABLET BY MOUTH DAILY, Disp: 90 tablet, Rfl: 1   ammonium lactate (AMLACTIN) 12 % lotion, Apply 1 application. topically as needed for dry skin., Disp: 400 g, Rfl: 0   antiseptic oral rinse (BIOTENE) LIQD, 15 mLs by Mouth Rinse route as needed for dry mouth., Disp: , Rfl:    aspirin EC 81 MG tablet, Take 81 mg by mouth daily. Swallow whole., Disp: , Rfl:    buprenorphine (BUTRANS) 15 MCG/HR, Place 1 patch onto the skin  once a week., Disp: 4 patch, Rfl: 3   Fluticasone-Umeclidin-Vilant (TRELEGY ELLIPTA) 100-62.5-25 MCG/ACT AEPB, Inhale 1 puff into the lungs daily., Disp: 3 each, Rfl: 11   gabapentin (NEURONTIN) 600 MG tablet, TAKE 1 TABLET BY MOUTH EVERY MORNING, 1 TABLET AT NOON, 1 TABLET IN THE EVENING AND 1 TABLET AT BEDTIME AS DIRECTED. *NOTE DOSEAGE CHANGE*, Disp: 180 tablet, Rfl: 1   hydrOXYzine (VISTARIL) 25 MG capsule, TAKE 1 CAPSULE BY MOUTH EVERY day as needed for anxiety (Patient taking differently: Take 25 mg by mouth daily as needed for anxiety. TAKE 1 CAPSULE BY MOUTH EVERY day as needed for anxiety), Disp: 30 capsule, Rfl: 2   Ipratropium-Albuterol (COMBIVENT RESPIMAT) 20-100 MCG/ACT AERS respimat, Inhale 1 puff into the lungs every 6 (six) hours as needed. Rescue inhaler, Disp: 4 g, Rfl: 3   ipratropium-albuterol (DUONEB) 0.5-2.5 (3) MG/3ML SOLN, Take 3 mLs by nebulization every 4 (four) hours as needed., Disp: 360 mL, Rfl: 2   omeprazole (PRILOSEC) 40 MG capsule, Take 1 capsule (40 mg total) by mouth daily., Disp: 30 capsule, Rfl: 0   pilocarpine (SALAGEN) 5 MG tablet, Take 1 tablet (5 mg total) by mouth 2 (two) times daily., Disp: 60 tablet, Rfl: 2   revefenacin (YUPELRI) 175 MCG/3ML nebulizer solution, Take 3 mLs (175 mcg total) by nebulization daily., Disp: 90 mL, Rfl: 2   rosuvastatin (CRESTOR) 5 MG tablet, TAKE 1 TABLET BY MOUTH DAILY. (Patient taking differently: Take 5 mg by mouth daily.), Disp: 90 tablet, Rfl: 3   tiZANidine (ZANAFLEX) 4 MG tablet, TAKE 1 TABLET BY MOUTH EVERY 6 HOURS AS NEEDED FOR MUSCLE SPASMS, Disp: 30 tablet, Rfl: 1 No current facility-administered medications for this visit.  Facility-Administered Medications Ordered in Other Visits:    heparin lock flush 100 unit/mL, 500 Units, Intravenous, Once, Sindy Guadeloupe, MD  Physical exam:  Vitals:   05/17/22 1359  BP: (!) 128/98  Pulse: 90  Resp: 18  Temp: (!) 97.4 F (36.3 C)  TempSrc: Tympanic  SpO2: 99%  Weight:  230 lb (104.3 kg)   Physical Exam Cardiovascular:     Rate and Rhythm: Normal rate and regular rhythm.     Heart sounds: Normal heart sounds.  Pulmonary:  Effort: Pulmonary effort is normal.     Breath sounds: Normal breath sounds.  Lymphadenopathy:     Comments: No palpable cervical adenopathy  Skin:    General: Skin is warm and dry.  Neurological:     Mental Status: He is alert and oriented to person, place, and time.         Latest Ref Rng & Units 05/17/2022    1:21 PM  CMP  Glucose 70 - 99 mg/dL 125   BUN 8 - 23 mg/dL 10   Creatinine 0.61 - 1.24 mg/dL 1.07   Sodium 135 - 145 mmol/L 139   Potassium 3.5 - 5.1 mmol/L 4.7   Chloride 98 - 111 mmol/L 100   CO2 22 - 32 mmol/L 29   Calcium 8.9 - 10.3 mg/dL 8.9   Total Protein 6.5 - 8.1 g/dL 8.1   Total Bilirubin 0.3 - 1.2 mg/dL 0.3   Alkaline Phos 38 - 126 U/L 74   AST 15 - 41 U/L 22   ALT 0 - 44 U/L 17       Latest Ref Rng & Units 05/17/2022    1:21 PM  CBC  WBC 4.0 - 10.5 K/uL 6.8   Hemoglobin 13.0 - 17.0 g/dL 12.6   Hematocrit 39.0 - 52.0 % 37.7   Platelets 150 - 400 K/uL 204     No images are attached to the encounter.  MR ANGIO HEAD WO CONTRAST  Result Date: 05/03/2022 CLINICAL DATA:  Initial evaluation for neuro deficit, stroke suspected, slurred speech. History of nasopharyngeal carcinoma. EXAM: MRI HEAD WITHOUT AND WITH CONTRAST MRA HEAD WITHOUT CONTRAST MRA NECK WITHOUT AND WITH CONTRAST TECHNIQUE: Multiplanar, multi-echo pulse sequences of the brain and surrounding structures were acquired without intravenous contrast. Angiographic images of the Circle of Willis were acquired using MRA technique without intravenous contrast. Angiographic images of the neck were acquired using MRA technique without and with intravenous contrast. Carotid stenosis measurements (when applicable) are obtained utilizing NASCET criteria, using the distal internal carotid diameter as the denominator. CONTRAST:  46m GADAVIST GADOBUTROL  1 MMOL/ML IV SOLN COMPARISON:  Prior PET-CT from 05/24/2019. FINDINGS: MRI HEAD FINDINGS Brain: Mildly advanced cerebral atrophy for age. Scattered patchy T2/FLAIR hyperintensity involving the periventricular, deep, and subcortical white matter both cerebral hemispheres, nonspecific, but most commonly related to chronic microvascular ischemic disease. Mild patchy involvement of the pons noted. Overall appearance is mild for age. No evidence for acute or subacute ischemia. Gray-white matter differentiation maintained. No areas of chronic cortical infarction or other insult. No acute or chronic intracranial blood products. No mass lesion, midline shift or mass effect. No hydrocephalus or extra-axial fluid collection. Pituitary gland and suprasellar region within normal limits. No abnormal enhancement. Vascular: Major intracranial vascular flow voids are maintained. Skull and upper cervical spine: Craniocervical junction within normal limits. Bone marrow signal intensity normal. No focal marrow replacing lesion. No scalp soft tissue abnormality. Sinuses/Orbits: Globes and orbital soft tissues demonstrate no acute finding. Chronic right maxillary and ethmoidal sinusitis noted. No significant mastoid effusion. Other: Visualized nasopharynx is unremarkable without visible residual or recurrent neoplasm. MRA HEAD FINDINGS Anterior circulation: Both internal carotid arteries widely patent to the termini without stenosis. A1 segments widely patent. Normal anterior communicating artery complex. Both anterior cerebral arteries widely patent to their distal aspects without stenosis. No M1 stenosis or occlusion. Normal MCA bifurcations. Distal MCA branches well perfused and symmetric. Posterior circulation: Both vertebral arteries patent without stenosis. Left vertebral artery dominant. Left PICA patent at  its origin. Right PICA not well seen. Basilar patent without stenosis. Superior cerebral arteries patent bilaterally. Both  PCAs primarily supplied via the basilar. Subtle short-segment mild stenosis noted at the proximal left P2 segment (series 1039, image 7). PCAs otherwise widely patent to their distal aspects without stenosis. Anatomic variants: Dominant left vertebral artery.  No aneurysm. MRA NECK FINDINGS Aortic arch: Visualized aortic arch normal caliber with standard 3 vessel morphology. No stenosis or other abnormality about the origin the great vessels. Right carotid system: Right common and internal carotid arteries patent without evidence for dissection. No significant atheromatous irregularity or narrowing about the right carotid bulb. Left carotid system: Left common and internal carotid arteries patent without evidence for dissection. Mild for age atheromatous irregularity about the left carotid bulb, but no hemodynamically significant greater than 50% stenosis. Vertebral arteries: Both vertebral arteries arise from the subclavian arteries. No proximal subclavian artery stenosis. Left vertebral artery dominant. Suspected moderate to severe stenosis at the origin of the right vertebral artery (series 1100, image 9). Vertebral arteries otherwise patent without stenosis or dissection. Other: None IMPRESSION: MRI HEAD IMPRESSION: 1. No acute intracranial abnormality. 2. Mildly advanced cerebral atrophy for age with mild chronic small vessel ischemic disease. 3. No MRI evidence for residual or recurrent nasopharyngeal mass. No metastatic disease. MRA HEAD IMPRESSION: Negative intracranial MRA for large vessel occlusion. No hemodynamically significant or correctable stenosis. No aneurysm. MRA NECK IMPRESSION: 1. 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 Otherwise wide patency of both vertebral arteries within the neck. Left vertebral artery dominant. 2. Mild for age atheromatous irregularity about the left carotid bulb, but no hemodynamically significant greater than 50% stenosis. 3. Wide  patency of the right carotid artery system within the neck. Electronically Signed   By: Jeannine Boga M.D.   On: 05/03/2022 23:26   MR BRAIN W WO CONTRAST  Result Date: 05/03/2022 CLINICAL DATA:  Initial evaluation for neuro deficit, stroke suspected, slurred speech. History of nasopharyngeal carcinoma. EXAM: MRI HEAD WITHOUT AND WITH CONTRAST MRA HEAD WITHOUT CONTRAST MRA NECK WITHOUT AND WITH CONTRAST TECHNIQUE: Multiplanar, multi-echo pulse sequences of the brain and surrounding structures were acquired without intravenous contrast. Angiographic images of the Circle of Willis were acquired using MRA technique without intravenous contrast. Angiographic images of the neck were acquired using MRA technique without and with intravenous contrast. Carotid stenosis measurements (when applicable) are obtained utilizing NASCET criteria, using the distal internal carotid diameter as the denominator. CONTRAST:  30m GADAVIST GADOBUTROL 1 MMOL/ML IV SOLN COMPARISON:  Prior PET-CT from 05/24/2019. FINDINGS: MRI HEAD FINDINGS Brain: Mildly advanced cerebral atrophy for age. Scattered patchy T2/FLAIR hyperintensity involving the periventricular, deep, and subcortical white matter both cerebral hemispheres, nonspecific, but most commonly related to chronic microvascular ischemic disease. Mild patchy involvement of the pons noted. Overall appearance is mild for age. No evidence for acute or subacute ischemia. Gray-white matter differentiation maintained. No areas of chronic cortical infarction or other insult. No acute or chronic intracranial blood products. No mass lesion, midline shift or mass effect. No hydrocephalus or extra-axial fluid collection. Pituitary gland and suprasellar region within normal limits. No abnormal enhancement. Vascular: Major intracranial vascular flow voids are maintained. Skull and upper cervical spine: Craniocervical junction within normal limits. Bone marrow signal intensity normal. No  focal marrow replacing lesion. No scalp soft tissue abnormality. Sinuses/Orbits: Globes and orbital soft tissues demonstrate no acute finding. Chronic right maxillary and ethmoidal sinusitis noted. No significant mastoid effusion. Other: Visualized  nasopharynx is unremarkable without visible residual or recurrent neoplasm. MRA HEAD FINDINGS Anterior circulation: Both internal carotid arteries widely patent to the termini without stenosis. A1 segments widely patent. Normal anterior communicating artery complex. Both anterior cerebral arteries widely patent to their distal aspects without stenosis. No M1 stenosis or occlusion. Normal MCA bifurcations. Distal MCA branches well perfused and symmetric. Posterior circulation: Both vertebral arteries patent without stenosis. Left vertebral artery dominant. Left PICA patent at its origin. Right PICA not well seen. Basilar patent without stenosis. Superior cerebral arteries patent bilaterally. Both PCAs primarily supplied via the basilar. Subtle short-segment mild stenosis noted at the proximal left P2 segment (series 1039, image 7). PCAs otherwise widely patent to their distal aspects without stenosis. Anatomic variants: Dominant left vertebral artery.  No aneurysm. MRA NECK FINDINGS Aortic arch: Visualized aortic arch normal caliber with standard 3 vessel morphology. No stenosis or other abnormality about the origin the great vessels. Right carotid system: Right common and internal carotid arteries patent without evidence for dissection. No significant atheromatous irregularity or narrowing about the right carotid bulb. Left carotid system: Left common and internal carotid arteries patent without evidence for dissection. Mild for age atheromatous irregularity about the left carotid bulb, but no hemodynamically significant greater than 50% stenosis. Vertebral arteries: Both vertebral arteries arise from the subclavian arteries. No proximal subclavian artery stenosis. Left  vertebral artery dominant. Suspected moderate to severe stenosis at the origin of the right vertebral artery (series 1100, image 9). Vertebral arteries otherwise patent without stenosis or dissection. Other: None IMPRESSION: MRI HEAD IMPRESSION: 1. No acute intracranial abnormality. 2. Mildly advanced cerebral atrophy for age with mild chronic small vessel ischemic disease. 3. No MRI evidence for residual or recurrent nasopharyngeal mass. No metastatic disease. MRA HEAD IMPRESSION: Negative intracranial MRA for large vessel occlusion. No hemodynamically significant or correctable stenosis. No aneurysm. MRA NECK IMPRESSION: 1. 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 Otherwise wide patency of both vertebral arteries within the neck. Left vertebral artery dominant. 2. Mild for age atheromatous irregularity about the left carotid bulb, but no hemodynamically significant greater than 50% stenosis. 3. Wide patency of the right carotid artery system within the neck. Electronically Signed   By: Jeannine Boga M.D.   On: 05/03/2022 23:26   MR ANGIO NECK W WO CONTRAST  Result Date: 05/03/2022 CLINICAL DATA:  Initial evaluation for neuro deficit, stroke suspected, slurred speech. History of nasopharyngeal carcinoma. EXAM: MRI HEAD WITHOUT AND WITH CONTRAST MRA HEAD WITHOUT CONTRAST MRA NECK WITHOUT AND WITH CONTRAST TECHNIQUE: Multiplanar, multi-echo pulse sequences of the brain and surrounding structures were acquired without intravenous contrast. Angiographic images of the Circle of Willis were acquired using MRA technique without intravenous contrast. Angiographic images of the neck were acquired using MRA technique without and with intravenous contrast. Carotid stenosis measurements (when applicable) are obtained utilizing NASCET criteria, using the distal internal carotid diameter as the denominator. CONTRAST:  46m GADAVIST GADOBUTROL 1 MMOL/ML IV SOLN COMPARISON:   Prior PET-CT from 05/24/2019. FINDINGS: MRI HEAD FINDINGS Brain: Mildly advanced cerebral atrophy for age. Scattered patchy T2/FLAIR hyperintensity involving the periventricular, deep, and subcortical white matter both cerebral hemispheres, nonspecific, but most commonly related to chronic microvascular ischemic disease. Mild patchy involvement of the pons noted. Overall appearance is mild for age. No evidence for acute or subacute ischemia. Gray-white matter differentiation maintained. No areas of chronic cortical infarction or other insult. No acute or chronic intracranial blood products. No mass lesion, midline  shift or mass effect. No hydrocephalus or extra-axial fluid collection. Pituitary gland and suprasellar region within normal limits. No abnormal enhancement. Vascular: Major intracranial vascular flow voids are maintained. Skull and upper cervical spine: Craniocervical junction within normal limits. Bone marrow signal intensity normal. No focal marrow replacing lesion. No scalp soft tissue abnormality. Sinuses/Orbits: Globes and orbital soft tissues demonstrate no acute finding. Chronic right maxillary and ethmoidal sinusitis noted. No significant mastoid effusion. Other: Visualized nasopharynx is unremarkable without visible residual or recurrent neoplasm. MRA HEAD FINDINGS Anterior circulation: Both internal carotid arteries widely patent to the termini without stenosis. A1 segments widely patent. Normal anterior communicating artery complex. Both anterior cerebral arteries widely patent to their distal aspects without stenosis. No M1 stenosis or occlusion. Normal MCA bifurcations. Distal MCA branches well perfused and symmetric. Posterior circulation: Both vertebral arteries patent without stenosis. Left vertebral artery dominant. Left PICA patent at its origin. Right PICA not well seen. Basilar patent without stenosis. Superior cerebral arteries patent bilaterally. Both PCAs primarily supplied via the  basilar. Subtle short-segment mild stenosis noted at the proximal left P2 segment (series 1039, image 7). PCAs otherwise widely patent to their distal aspects without stenosis. Anatomic variants: Dominant left vertebral artery.  No aneurysm. MRA NECK FINDINGS Aortic arch: Visualized aortic arch normal caliber with standard 3 vessel morphology. No stenosis or other abnormality about the origin the great vessels. Right carotid system: Right common and internal carotid arteries patent without evidence for dissection. No significant atheromatous irregularity or narrowing about the right carotid bulb. Left carotid system: Left common and internal carotid arteries patent without evidence for dissection. Mild for age atheromatous irregularity about the left carotid bulb, but no hemodynamically significant greater than 50% stenosis. Vertebral arteries: Both vertebral arteries arise from the subclavian arteries. No proximal subclavian artery stenosis. Left vertebral artery dominant. Suspected moderate to severe stenosis at the origin of the right vertebral artery (series 1100, image 9). Vertebral arteries otherwise patent without stenosis or dissection. Other: None IMPRESSION: MRI HEAD IMPRESSION: 1. No acute intracranial abnormality. 2. Mildly advanced cerebral atrophy for age with mild chronic small vessel ischemic disease. 3. No MRI evidence for residual or recurrent nasopharyngeal mass. No metastatic disease. MRA HEAD IMPRESSION: Negative intracranial MRA for large vessel occlusion. No hemodynamically significant or correctable stenosis. No aneurysm. MRA NECK IMPRESSION: 1. 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 Otherwise wide patency of both vertebral arteries within the neck. Left vertebral artery dominant. 2. Mild for age atheromatous irregularity about the left carotid bulb, but no hemodynamically significant greater than 50% stenosis. 3. Wide patency of the right carotid  artery system within the neck. Electronically Signed   By: Jeannine Boga M.D.   On: 05/03/2022 23:26   DG Chest 1 View  Result Date: 05/03/2022 CLINICAL DATA:  Aphasia. EXAM: CHEST  1 VIEW COMPARISON:  Chest x-ray 05/03/2022. FINDINGS: The heart size and mediastinal contours are within normal limits. Both lungs are clear. No visible pleural effusions or pneumothorax. No acute osseous abnormality. IMPRESSION: No evidence of acute cardiopulmonary disease. Electronically Signed   By: Margaretha Sheffield M.D.   On: 05/03/2022 19:56   CT Head Wo Contrast  Result Date: 05/03/2022 CLINICAL DATA:  Slurred speech, aphasia, altered level of consciousness EXAM: CT HEAD WITHOUT CONTRAST TECHNIQUE: Contiguous axial images were obtained from the base of the skull through the vertex without intravenous contrast. RADIATION DOSE REDUCTION: This exam was performed according to the departmental dose-optimization program which includes  automated exposure control, adjustment of the mA and/or kV according to patient size and/or use of iterative reconstruction technique. COMPARISON:  12/10/2019 FINDINGS: Brain: No acute infarct or hemorrhage. Lateral ventricles and midline structures are unremarkable. No acute extra-axial fluid collections. No mass effect. Vascular: No hyperdense vessel or unexpected calcification. Skull: Normal. Negative for fracture or focal lesion. Sinuses/Orbits: Opacification of the right maxillary sinus. Prominent mucoperiosteal thickening within the right ethmoid air cells. Other: None. IMPRESSION: 1. No acute infarct or hemorrhage. 2. Right maxillary and ethmoid sinus disease. Electronically Signed   By: Randa Ngo M.D.   On: 05/03/2022 16:07   DG Chest 2 View  Result Date: 05/03/2022 CLINICAL DATA:  Slurred speech and aphasia EXAM: CHEST - 2 VIEW COMPARISON:  04/06/2022 FINDINGS: Cardiac shadow is at the upper limits of normal size. Aortic calcifications are seen. Lungs are clear  bilaterally. No bony abnormality is seen. IMPRESSION: No acute abnormality noted. Electronically Signed   By: Inez Catalina M.D.   On: 05/03/2022 15:54     Assessment and plan- Patient is a 72 y.o. 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  He is here for routine follow-up visit  Clinically patient is doing well withNo concerning signs and symptoms of recurrence based on today's exam.  He is following up with ENT as well every 6 months.  I will see him back in 6 months no labs   Visit Diagnosis 1. Encounter for follow-up surveillance of head and neck cancer      Dr. Randa Evens, MD, MPH [facility] at [facility] [phone] 05/19/2022 9:09 AM

## 2022-05-21 ENCOUNTER — Encounter: Payer: Self-pay | Admitting: Oncology

## 2022-05-21 ENCOUNTER — Ambulatory Visit
Payer: Medicare HMO | Attending: Student in an Organized Health Care Education/Training Program | Admitting: Student in an Organized Health Care Education/Training Program

## 2022-05-21 ENCOUNTER — Other Ambulatory Visit: Payer: Self-pay | Admitting: Internal Medicine

## 2022-05-21 ENCOUNTER — Encounter: Payer: Self-pay | Admitting: Student in an Organized Health Care Education/Training Program

## 2022-05-21 VITALS — BP 140/94 | HR 98 | Temp 96.6°F | Resp 18 | Ht 69.0 in | Wt 225.0 lb

## 2022-05-21 DIAGNOSIS — G894 Chronic pain syndrome: Secondary | ICD-10-CM | POA: Diagnosis not present

## 2022-05-21 DIAGNOSIS — T451X5A Adverse effect of antineoplastic and immunosuppressive drugs, initial encounter: Secondary | ICD-10-CM

## 2022-05-21 DIAGNOSIS — M5442 Lumbago with sciatica, left side: Secondary | ICD-10-CM | POA: Diagnosis not present

## 2022-05-21 DIAGNOSIS — G620 Drug-induced polyneuropathy: Secondary | ICD-10-CM

## 2022-05-21 DIAGNOSIS — D701 Agranulocytosis secondary to cancer chemotherapy: Secondary | ICD-10-CM | POA: Insufficient documentation

## 2022-05-21 DIAGNOSIS — C119 Malignant neoplasm of nasopharynx, unspecified: Secondary | ICD-10-CM

## 2022-05-21 DIAGNOSIS — F321 Major depressive disorder, single episode, moderate: Secondary | ICD-10-CM

## 2022-05-21 DIAGNOSIS — M5441 Lumbago with sciatica, right side: Secondary | ICD-10-CM | POA: Diagnosis not present

## 2022-05-21 DIAGNOSIS — R69 Illness, unspecified: Secondary | ICD-10-CM | POA: Diagnosis not present

## 2022-05-21 MED ORDER — BUPRENORPHINE 15 MCG/HR TD PTWK: 1.0000 | MEDICATED_PATCH | TRANSDERMAL | 3 refills | Status: AC

## 2022-05-21 MED ORDER — TIZANIDINE HCL 4 MG PO TABS: 4.0000 mg | ORAL_TABLET | Freq: Four times a day (QID) | ORAL | 2 refills | Status: DC | PRN

## 2022-05-21 NOTE — Progress Notes (Signed)
Nursing Pain Medication Assessment:  Safety precautions to be maintained throughout the outpatient stay will include: orient to surroundings, keep bed in low position, maintain call bell within reach at all times, provide assistance with transfer out of bed and ambulation.  Medication Inspection Compliance: Pill count conducted under aseptic conditions, in front of the patient. Neither the pills nor the bottle was removed from the patient's sight at any time. Once count was completed pills were immediately returned to the patient in their original bottle.  Medication: Buprenorphine (Suboxone) Pill/Patch Count:  1 of 4 pills remain Pill/Patch Appearance: Markings consistent with prescribed medication Bottle Appearance: Standard pharmacy container. Clearly labeled. Filled Date: 09 / 20 / 2023 Last Medication intake:   7 days ago

## 2022-05-21 NOTE — Progress Notes (Signed)
PROVIDER NOTE: Information contained herein reflects review and annotations entered in association with encounter. Interpretation of such information and data should be left to medically-trained personnel. Information provided to patient can be located elsewhere in the medical record under "Patient Instructions". Document created using STT-dictation technology, any transcriptional errors that may result from process are unintentional.    Patient: [name]  Service Category: E/M  Provider: Gillis Santa, MD  DOB: 03/28/1951  DOS: 05/21/2022  Specialty: Interventional Pain Management  MRN: [id number]  Setting: Ambulatory outpatient  PCP: Anthony Guise, MD  Type: Established Patient    Referring Provider: Lavera Guise, MD  Location: Office  Delivery: Face-to-face     HPI  [name], a 72 y.o. 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 [name] primary complain today is Foot Pain (bilateral) Last encounter: My last encounter with him was on 01/29/22  Pertinent problems: Mr. 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 Pain Assessment: Severity of Chronic pain is reported as a 6 /10. Location: Foot (face pain) Right, Left/feet radiate up into the legs. Onset: More than a month ago. Quality: Discomfort, Constant, Stabbing. Timing: Constant. Modifying factor(s): nothing currently Vitals:  height is '5\' 9"'$  (1.753 m) and weight is 225 lb (102.1 kg). His temperature is 96.6 F (35.9 C) (abnormal). His blood pressure is 140/94 (abnormal) and his pulse is 98. His respiration is 18 and oxygen saturation is 98%.   Reason for encounter:   [name] presents today for medication management.  He was hospitalized in late December when he presented to the emergency department on 05/03/2022  for aphasia and there was concern for stroke.  Stroke workup was negative.   He continues to endorse paresthesias of bilateral feet.  He finds this difficult to manage.  He is optimized on gabapentin and I do not recommend further dose escalation there.  Amitriptyline was increased from 50-->75 mg qhs.  11/07/21 Patient follows up today for medication management.  He is endorsing analgesic and functional benefit with buprenorphine.  His dose was increased from 5 mcg to 7.5 mcg and he is endorsing approximately 40% overall pain relief with the addition of his transdermal patch.  He is not having any side effects of cognitive changes, nausea, sedation, constipation.  He utilizes a laxative when needed.  We discussed increasing his dose to 10 mcg an hour to optimize analgesic response.  We will reevaluate in 10 to 12 weeks.  08/16/2021: [name] follows up today for his second patient visit.  I reviewed his urine toxicology screen with him today which was positive for THC.  I informed the patient that we will need a clean urine toxicology screen before we are able to consider any controlled substances such as tramadol or buprenorphine.  Patient endorsed understanding.  He states that he has been utilizing [place] which he states he will stop.  I informed him that I cannot prescribe him a controlled 2 or controlled 3 with him having an illicit substance in his urine.  Patient endorsed understanding.  We will repeat his urine toxicology screen today and he is instructed to follow-up in 10 days to review.  HPI from initial clinic visit: 08/02/2021 [name] is a pleasant 72 year old male who presents with chronic pain related to chemotherapy induced neuropathy.  Patient has a history of nasopharyngeal carcinoma  status postchemotherapy and radiation.  He describes burning and tingling along his face and in his extremities.  He has tried trigger trial in the past with limited response.  He has been seen by neurology and has  been managed on various OT convulsants and neuropathic's including gabapentin which she is currently taking at 600 mg 3 times daily to 4 times daily.  He has also tried TCA with amitriptyline and is currently on 50 mg amitriptyline nightly.  He has also tried tizanidine in the past.  He is not a diabetic.  He has done physical therapy in the past.  When I asked him about his positive urine toxicology screen in 2021 for cocaine, he states that that was a lab error.   I informed the patient that his only medication option here would be buprenorphine for chronic pain management.  Pharmacotherapy Assessment  Buprenorphine 15 mcg an hour  Monitoring: [place] PMP: PDMP reviewed during this encounter.         Anthony Shorter, RN  05/21/2022 10:18 AM  Sign when Signing Visit Nursing Pain Medication Assessment:  Safety precautions to be maintained throughout the outpatient stay will include: orient to surroundings, keep bed in low position, maintain call bell within reach at all times, provide assistance with transfer out of bed and ambulation.  Medication Inspection Compliance: Pill count conducted under aseptic conditions, in front of the patient. Neither the pills nor the bottle was removed from the patient's sight at any time. Once count was completed pills were immediately returned to the patient in their original bottle.  Medication: Buprenorphine (Suboxone) Pill/Patch Count:  1 of 4 pills remain Pill/Patch Appearance: Markings consistent with prescribed medication Bottle Appearance: Standard pharmacy container. Clearly labeled. Filled Date: 09 / 20 / 2023 Last Medication intake:   7 days ago    UDS:  Summary  Date Value Ref Range Status  09/05/2021 Note  Final    Comment:    ==================================================================== Compliance Drug Analysis, Ur ==================================================================== Test                             Result       Flag        Units  Drug Present and Declared for Prescription Verification   Gabapentin                     PRESENT      EXPECTED   Amitriptyline                  PRESENT      EXPECTED   Nortriptyline                  PRESENT      EXPECTED    Nortriptyline is an expected metabolite of amitriptyline.    Chlorpheniramine               PRESENT      EXPECTED   Hydroxyzine                    PRESENT      EXPECTED  Drug Present not Declared for Prescription Verification   Carboxy-THC                    15           UNEXPECTED ng/mg creat    Carboxy-THC is a metabolite of tetrahydrocannabinol (THC). Source of  THC is most commonly herbal marijuana or marijuana-based products,    but THC is also present in a scheduled prescription medication.    Trace amounts of THC can be present in hemp and cannabidiol (CBD)    products. This test is not intended to distinguish between delta-9-    tetrahydrocannabinol, the predominant form of THC in most herbal or    marijuana-based products, and delta-8-tetrahydrocannabinol.    Doxylamine                     PRESENT      UNEXPECTED  Drug Absent but Declared for Prescription Verification   Hydrocodone                    Not Detected UNEXPECTED ng/mg creat   Tizanidine                     Not Detected UNEXPECTED    Tizanidine, as indicated in the declared medication list, is not    always detected even when used as directed.    Salicylate                     Not Detected UNEXPECTED    Aspirin, as indicated in the declared medication list, is not always    detected even when used as directed.  ==================================================================== Test                      Result    Flag   Units      Ref Range   Creatinine              26               mg/dL      >=20 ==================================================================== Declared Medications:  The flagging and interpretation on this report are based on the  following declared  medications.  Unexpected results may arise from  inaccuracies in the declared medications.   **Note: The testing scope of this panel includes these medications:   Amitriptyline (Elavil)  Chlorpheniramine  Gabapentin (Neurontin)  Hydrocodone  Hydroxyzine (Vistaril)   **Note: The testing scope of this panel does not include small to  moderate amounts of these reported medications:   Aspirin  Tizanidine (Zanaflex)   **Note: The testing scope of this panel does not include the  following reported medications:   Amlodipine (Norvasc)  Ammonium Hydroxide (Ammonium Lactate)  Azithromycin (Zithromax)  Benzonatate (Tessalon)  Fluoride (Biotene)  Lactic Acid (Ammonium Lactate)  Mouthwash  Multivitamin  Omeprazole (Prilosec)  Rosuvastatin (Crestor) ==================================================================== For clinical consultation, please call [phone]. ====================================================================      ROS  Constitutional: Denies any fever or chills Gastrointestinal: No reported hemesis, hematochezia, vomiting, or acute GI distress Musculoskeletal: Denies any acute onset joint swelling, redness, loss of ROM, or weakness Neurological:  Lower extremity paresthesias  Medication Review  Fluticasone-Umeclidin-Vilant, Ipratropium-Albuterol, amLODipine, amitriptyline, ammonium lactate, antiseptic oral rinse, aspirin EC, buprenorphine, gabapentin, hydrOXYzine, ipratropium-albuterol, omeprazole, pilocarpine, revefenacin, rosuvastatin, and tiZANidine  History Review  Allergy: [name] is allergic to opana [oxymorphone hcl], elemental sulfur, oxymorphone, oxymorphone, sulfa antibiotics, and sulfa antibiotics. Drug: [name]  reports that he does not currently use drugs. Alcohol:  reports that he does not currently use alcohol after a past usage of about 14.0 standard drinks of alcohol per week. Tobacco:  reports that he quit smoking about 3  years ago. His smoking use included cigarettes. He has a 25.00  pack-year smoking history. He has been exposed to tobacco smoke. He has never used smokeless tobacco. Social: [name]  reports that he quit smoking about 3 years ago. His smoking use included cigarettes. He has a 25.00 pack-year smoking history. He has been exposed to tobacco smoke. He has never used smokeless tobacco. He reports that he does not currently use alcohol after a past usage of about 14.0 standard drinks of alcohol per week. He reports that he does not currently use drugs. Medical:  has a past medical history of Alcohol abuse, Benzodiazepine dependence ([place]), Benzodiazepine withdrawal ([place]), Chronic pain in right foot, COPD (chronic obstructive pulmonary disease) ([place]), Depression (08/19/2017), Hepatitis C, Hypertension, 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[place]), Seizures ([place]) (2010), Sleep apnea, and Syncope. Surgical: [name]  has a past surgical history that includes Colonoscopy with propofol (N/A, 12/16/2016); Colonoscopy with propofol (N/A, 03/14/2017); Myringotomy with tube placement (Left, 04/28/2019); Nasopharyngoscopy (N/A, 04/28/2019); Foot surgery (Right, 03/12/2002); PORTA CATH INSERTION (N/A, 05/20/2019); Lithotripsy; Hemorrhoid surgery; Tonsillectomy; Colonoscopy with propofol; Myringotomy; Nasopharyngoscopy; Foot surgery; Port a cath Placement; and PORTA CATH REMOVAL (N/A, 11/29/2020). Family: family history includes Alcohol abuse in his paternal aunt; Arthritis in his mother; Cancer in his father; Depression in his maternal grandfather; Hypertension in his mother; Kidney disease in his father.  Laboratory Chemistry Profile   Renal Lab Results  Component Value Date   BUN 10 05/17/2022   CREATININE 1.07 05/17/2022   BCR 10 02/26/2018   GFRAA >60 12/11/2019   GFRNONAA >60 05/17/2022    Hepatic Lab Results  Component Value Date   AST 22 05/17/2022   ALT 17 05/17/2022   ALBUMIN 4.1 05/17/2022   ALKPHOS 74 05/17/2022    LIPASE 37 04/06/2022   AMMONIA <10 05/04/2022    Electrolytes Lab Results  Component Value Date   NA 139 05/17/2022   K 4.7 05/17/2022   CL 100 05/17/2022   CALCIUM 8.9 05/17/2022   MG 2.3 05/04/2022    Bone No results found for: "VD25OH", "VD125OH2TOT", "YN8295AO1", "HY8657QI6", "25OHVITD1", "25OHVITD2", "25OHVITD3", "TESTOFREE", "TESTOSTERONE"  Inflammation (CRP: Acute Phase) (ESR: Chronic Phase) Lab Results  Component Value Date   LATICACIDVEN 0.9 05/03/2022         Note: Above Lab results reviewed.  Recent Imaging Review  MR ANGIO NECK W WO CONTRAST CLINICAL DATA:  Initial evaluation for neuro deficit, stroke suspected, slurred speech. History of nasopharyngeal carcinoma.  EXAM: MRI HEAD WITHOUT AND WITH CONTRAST  MRA HEAD WITHOUT CONTRAST  MRA NECK WITHOUT AND WITH CONTRAST  TECHNIQUE: Multiplanar, multi-echo pulse sequences of the brain and surrounding structures were acquired without intravenous contrast. Angiographic images of the Circle of Willis were acquired using MRA technique without intravenous contrast. Angiographic images of the neck were acquired using MRA technique without and with intravenous contrast. Carotid stenosis measurements (when applicable) are obtained utilizing NASCET criteria, using the distal internal carotid diameter as the denominator.  CONTRAST:  33m GADAVIST GADOBUTROL 1 MMOL/ML [place] SOLN  COMPARISON:  Prior PET-CT from 05/24/2019.  FINDINGS: MRI HEAD FINDINGS  Brain: Mildly advanced cerebral atrophy for age. Scattered patchy T2/FLAIR hyperintensity involving the periventricular, deep, and subcortical white matter both cerebral hemispheres, nonspecific, but most commonly related to chronic microvascular ischemic disease. Mild patchy involvement of the pons noted. Overall appearance is mild for age.  No evidence for acute or subacute ischemia. Gray-white matter differentiation maintained. No areas of chronic cortical  infarction or other insult. No acute or chronic intracranial blood products.  No mass lesion, midline shift or mass  effect. No hydrocephalus or extra-axial fluid collection. Pituitary gland and suprasellar region within normal limits.  No abnormal enhancement.  Vascular: Major intracranial vascular flow voids are maintained.  Skull and upper cervical spine: Craniocervical junction within normal limits. Bone marrow signal intensity normal. No focal marrow replacing lesion. No scalp soft tissue abnormality.  Sinuses/Orbits: Globes and orbital soft tissues demonstrate no acute finding. Chronic right maxillary and ethmoidal sinusitis noted. No significant mastoid effusion.  Other: Visualized nasopharynx is unremarkable without visible residual or recurrent neoplasm.  MRA HEAD FINDINGS  Anterior circulation: Both internal carotid arteries widely patent to the termini without stenosis. A1 segments widely patent. Normal anterior communicating artery complex. Both anterior cerebral arteries widely patent to their distal aspects without stenosis. No M1 stenosis or occlusion. Normal MCA bifurcations. Distal MCA branches well perfused and symmetric.  Posterior circulation: Both vertebral arteries patent without stenosis. Left vertebral artery dominant. Left PICA patent at its origin. Right PICA not well seen. Basilar patent without stenosis. Superior cerebral arteries patent bilaterally. Both PCAs primarily supplied via the basilar. Subtle short-segment mild stenosis noted at the proximal left P2 segment (series 1039, image 7). PCAs otherwise widely patent to their distal aspects without stenosis.  Anatomic variants: Dominant left vertebral artery.  No aneurysm.  MRA NECK FINDINGS  Aortic arch: Visualized aortic arch normal caliber with standard 3 vessel morphology. No stenosis or other abnormality about the origin the great vessels.  Right carotid system: Right common and  internal carotid arteries patent without evidence for dissection. No significant atheromatous irregularity or narrowing about the right carotid bulb.  Left carotid system: Left common and internal carotid arteries patent without evidence for dissection. Mild for age atheromatous irregularity about the left carotid bulb, but no hemodynamically significant greater than 50% stenosis.  Vertebral arteries: Both vertebral arteries arise from the subclavian arteries. No proximal subclavian artery stenosis. Left vertebral artery dominant. Suspected moderate to severe stenosis at the origin of the right vertebral artery (series 1100, image 9). Vertebral arteries otherwise patent without stenosis or dissection.  Other: None  IMPRESSION: MRI HEAD IMPRESSION:  1. No acute intracranial abnormality. 2. Mildly advanced cerebral atrophy for age with mild chronic small vessel ischemic disease. 3. No MRI evidence for residual or recurrent nasopharyngeal mass. No metastatic disease.  MRA HEAD IMPRESSION:  Negative intracranial MRA for large vessel occlusion. No hemodynamically significant or correctable stenosis. No aneurysm.  MRA NECK IMPRESSION:  1. 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 Otherwise wide patency of both vertebral arteries within the neck. Left vertebral artery dominant. 2. Mild for age atheromatous irregularity about the left carotid bulb, but no hemodynamically significant greater than 50% stenosis. 3. Wide patency of the right carotid artery system within the neck.  Electronically Signed   By: Jeannine Boga M.D.   On: 05/03/2022 23:26 MR BRAIN W WO CONTRAST CLINICAL DATA:  Initial evaluation for neuro deficit, stroke suspected, slurred speech. History of nasopharyngeal carcinoma.  EXAM: MRI HEAD WITHOUT AND WITH CONTRAST  MRA HEAD WITHOUT CONTRAST  MRA NECK WITHOUT AND WITH CONTRAST  TECHNIQUE: Multiplanar,  multi-echo pulse sequences of the brain and surrounding structures were acquired without intravenous contrast. Angiographic images of the Circle of Willis were acquired using MRA technique without intravenous contrast. Angiographic images of the neck were acquired using MRA technique without and with intravenous contrast. Carotid stenosis measurements (when applicable) are obtained utilizing NASCET criteria, using the distal internal carotid diameter as the denominator.  CONTRAST:  66m GADAVIST GADOBUTROL 1 MMOL/ML [place] SOLN  COMPARISON:  Prior PET-CT from 05/24/2019.  FINDINGS: MRI HEAD FINDINGS  Brain: Mildly advanced cerebral atrophy for age. Scattered patchy T2/FLAIR hyperintensity involving the periventricular, deep, and subcortical white matter both cerebral hemispheres, nonspecific, but most commonly related to chronic microvascular ischemic disease. Mild patchy involvement of the pons noted. Overall appearance is mild for age.  No evidence for acute or subacute ischemia. Gray-white matter differentiation maintained. No areas of chronic cortical infarction or other insult. No acute or chronic intracranial blood products.  No mass lesion, midline shift or mass effect. No hydrocephalus or extra-axial fluid collection. Pituitary gland and suprasellar region within normal limits.  No abnormal enhancement.  Vascular: Major intracranial vascular flow voids are maintained.  Skull and upper cervical spine: Craniocervical junction within normal limits. Bone marrow signal intensity normal. No focal marrow replacing lesion. No scalp soft tissue abnormality.  Sinuses/Orbits: Globes and orbital soft tissues demonstrate no acute finding. Chronic right maxillary and ethmoidal sinusitis noted. No significant mastoid effusion.  Other: Visualized nasopharynx is unremarkable without visible residual or recurrent neoplasm.  MRA HEAD FINDINGS  Anterior circulation: Both internal  carotid arteries widely patent to the termini without stenosis. A1 segments widely patent. Normal anterior communicating artery complex. Both anterior cerebral arteries widely patent to their distal aspects without stenosis. No M1 stenosis or occlusion. Normal MCA bifurcations. Distal MCA branches well perfused and symmetric.  Posterior circulation: Both vertebral arteries patent without stenosis. Left vertebral artery dominant. Left PICA patent at its origin. Right PICA not well seen. Basilar patent without stenosis. Superior cerebral arteries patent bilaterally. Both PCAs primarily supplied via the basilar. Subtle short-segment mild stenosis noted at the proximal left P2 segment (series 1039, image 7). PCAs otherwise widely patent to their distal aspects without stenosis.  Anatomic variants: Dominant left vertebral artery.  No aneurysm.  MRA NECK FINDINGS  Aortic arch: Visualized aortic arch normal caliber with standard 3 vessel morphology. No stenosis or other abnormality about the origin the great vessels.  Right carotid system: Right common and internal carotid arteries patent without evidence for dissection. No significant atheromatous irregularity or narrowing about the right carotid bulb.  Left carotid system: Left common and internal carotid arteries patent without evidence for dissection. Mild for age atheromatous irregularity about the left carotid bulb, but no hemodynamically significant greater than 50% stenosis.  Vertebral arteries: Both vertebral arteries arise from the subclavian arteries. No proximal subclavian artery stenosis. Left vertebral artery dominant. Suspected moderate to severe stenosis at the origin of the right vertebral artery (series 1100, image 9). Vertebral arteries otherwise patent without stenosis or dissection.  Other: None  IMPRESSION: MRI HEAD IMPRESSION:  1. No acute intracranial abnormality. 2. Mildly advanced cerebral atrophy for  age with mild chronic small vessel ischemic disease. 3. No MRI evidence for residual or recurrent nasopharyngeal mass. No metastatic disease.  MRA HEAD IMPRESSION:  Negative intracranial MRA for large vessel occlusion. No hemodynamically significant or correctable stenosis. No aneurysm.  MRA NECK IMPRESSION:  1. 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 Otherwise wide patency of both vertebral arteries within the neck. Left vertebral artery dominant. 2. Mild for age atheromatous irregularity about the left carotid bulb, but no hemodynamically significant greater than 50% stenosis. 3. Wide patency of the right carotid artery system within the neck.  Electronically Signed   By: BJeannine BogaM.D.   On: 05/03/2022 23:26 MR ANGIO HEAD WO CONTRAST CLINICAL DATA:  Initial evaluation for neuro deficit, stroke suspected, slurred speech. History of nasopharyngeal carcinoma.  EXAM: MRI HEAD WITHOUT AND WITH CONTRAST  MRA HEAD WITHOUT CONTRAST  MRA NECK WITHOUT AND WITH CONTRAST  TECHNIQUE: Multiplanar, multi-echo pulse sequences of the brain and surrounding structures were acquired without intravenous contrast. Angiographic images of the Circle of Willis were acquired using MRA technique without intravenous contrast. Angiographic images of the neck were acquired using MRA technique without and with intravenous contrast. Carotid stenosis measurements (when applicable) are obtained utilizing NASCET criteria, using the distal internal carotid diameter as the denominator.  CONTRAST:  28m GADAVIST GADOBUTROL 1 MMOL/ML [place] SOLN  COMPARISON:  Prior PET-CT from 05/24/2019.  FINDINGS: MRI HEAD FINDINGS  Brain: Mildly advanced cerebral atrophy for age. Scattered patchy T2/FLAIR hyperintensity involving the periventricular, deep, and subcortical white matter both cerebral hemispheres, nonspecific, but most commonly related to chronic  microvascular ischemic disease. Mild patchy involvement of the pons noted. Overall appearance is mild for age.  No evidence for acute or subacute ischemia. Gray-white matter differentiation maintained. No areas of chronic cortical infarction or other insult. No acute or chronic intracranial blood products.  No mass lesion, midline shift or mass effect. No hydrocephalus or extra-axial fluid collection. Pituitary gland and suprasellar region within normal limits.  No abnormal enhancement.  Vascular: Major intracranial vascular flow voids are maintained.  Skull and upper cervical spine: Craniocervical junction within normal limits. Bone marrow signal intensity normal. No focal marrow replacing lesion. No scalp soft tissue abnormality.  Sinuses/Orbits: Globes and orbital soft tissues demonstrate no acute finding. Chronic right maxillary and ethmoidal sinusitis noted. No significant mastoid effusion.  Other: Visualized nasopharynx is unremarkable without visible residual or recurrent neoplasm.  MRA HEAD FINDINGS  Anterior circulation: Both internal carotid arteries widely patent to the termini without stenosis. A1 segments widely patent. Normal anterior communicating artery complex. Both anterior cerebral arteries widely patent to their distal aspects without stenosis. No M1 stenosis or occlusion. Normal MCA bifurcations. Distal MCA branches well perfused and symmetric.  Posterior circulation: Both vertebral arteries patent without stenosis. Left vertebral artery dominant. Left PICA patent at its origin. Right PICA not well seen. Basilar patent without stenosis. Superior cerebral arteries patent bilaterally. Both PCAs primarily supplied via the basilar. Subtle short-segment mild stenosis noted at the proximal left P2 segment (series 1039, image 7). PCAs otherwise widely patent to their distal aspects without stenosis.  Anatomic variants: Dominant left vertebral artery.  No  aneurysm.  MRA NECK FINDINGS  Aortic arch: Visualized aortic arch normal caliber with standard 3 vessel morphology. No stenosis or other abnormality about the origin the great vessels.  Right carotid system: Right common and internal carotid arteries patent without evidence for dissection. No significant atheromatous irregularity or narrowing about the right carotid bulb.  Left carotid system: Left common and internal carotid arteries patent without evidence for dissection. Mild for age atheromatous irregularity about the left carotid bulb, but no hemodynamically significant greater than 50% stenosis.  Vertebral arteries: Both vertebral arteries arise from the subclavian arteries. No proximal subclavian artery stenosis. Left vertebral artery dominant. Suspected moderate to severe stenosis at the origin of the right vertebral artery (series 1100, image 9). Vertebral arteries otherwise patent without stenosis or dissection.  Other: None  IMPRESSION: MRI HEAD IMPRESSION:  1. No acute intracranial abnormality. 2. Mildly advanced cerebral atrophy for age with mild chronic small vessel ischemic disease. 3. No MRI evidence for residual or recurrent nasopharyngeal mass. No metastatic disease.  MRA HEAD IMPRESSION:  Negative intracranial MRA for large vessel occlusion. No hemodynamically significant or correctable stenosis. No aneurysm.  MRA NECK IMPRESSION:  1. 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 Otherwise wide patency of both vertebral arteries within the neck. Left vertebral artery dominant. 2. Mild for age atheromatous irregularity about the left carotid bulb, but no hemodynamically significant greater than 50% stenosis. 3. Wide patency of the right carotid artery system within the neck.  Electronically Signed   By: Jeannine Boga M.D.   On: 05/03/2022 23:26 DG Chest 1 View CLINICAL DATA:  Aphasia.  EXAM: CHEST  1  VIEW  COMPARISON:  Chest x-ray 05/03/2022.  FINDINGS: The heart size and mediastinal contours are within normal limits. Both lungs are clear. No visible pleural effusions or pneumothorax. No acute osseous abnormality.  IMPRESSION: No evidence of acute cardiopulmonary disease.  Electronically Signed   By: Margaretha Sheffield M.D.   On: 05/03/2022 19:56 CT Head Wo Contrast CLINICAL DATA:  Slurred speech, aphasia, altered level of consciousness  EXAM: CT HEAD WITHOUT CONTRAST  TECHNIQUE: Contiguous axial images were obtained from the base of the skull through the vertex without intravenous contrast.  RADIATION DOSE REDUCTION: This exam was performed according to the departmental dose-optimization program which includes automated exposure control, adjustment of the mA and/or kV according to patient size and/or use of iterative reconstruction technique.  COMPARISON:  12/10/2019  FINDINGS: Brain: No acute infarct or hemorrhage. Lateral ventricles and midline structures are unremarkable. No acute extra-axial fluid collections. No mass effect.  Vascular: No hyperdense vessel or unexpected calcification.  Skull: Normal. Negative for fracture or focal lesion.  Sinuses/Orbits: Opacification of the right maxillary sinus. Prominent mucoperiosteal thickening within the right ethmoid air cells.  Other: None.  IMPRESSION: 1. No acute infarct or hemorrhage. 2. Right maxillary and ethmoid sinus disease.  Electronically Signed   By: Randa Ngo M.D.   On: 05/03/2022 16:07 DG Chest 2 View CLINICAL DATA:  Slurred speech and aphasia  EXAM: CHEST - 2 VIEW  COMPARISON:  04/06/2022  FINDINGS: Cardiac shadow is at the upper limits of normal size. Aortic calcifications are seen. Lungs are clear bilaterally. No bony abnormality is seen.  IMPRESSION: No acute abnormality noted.  Electronically Signed   By: Inez Catalina M.D.   On: 05/03/2022 15:54 Note: Reviewed         Physical Exam  General appearance: Well nourished, well developed, and well hydrated. In no apparent acute distress Mental status: Alert, oriented x 3 (person, place, & time)       Respiratory: No evidence of acute respiratory distress Eyes: PERLA Vitals: BP (!) 140/94   Pulse 98   Temp (!) 96.6 F (35.9 C)   Resp 18   Ht '5\' 9"'$  (1.753 m)   Wt 225 lb (102.1 kg)   SpO2 98%   BMI 33.23 kg/m  BMI: Estimated body mass index is 33.23 kg/m as calculated from the following:   Height as of this encounter: '5\' 9"'$  (1.753 m).   Weight as of this encounter: 225 lb (102.1 kg). Ideal: Ideal body weight: 70.7 kg (155 lb 13.8 oz) Adjusted ideal body weight: 83.2 kg (183 lb 8.3 oz)  Assessment   Diagnosis Status  1. Chemotherapy-induced peripheral neuropathy ([place])   2. Chemotherapy induced neutropenia (HCC)   3. Depression, major, single episode, moderate ([place])   4. 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 Chronic pain syndrome   6. 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 I was very clear and direct with [name] about treatment plan, specifically as it pertain to medication management   Requested Prescriptions   Signed Prescriptions Disp Refills   buprenorphine (BUTRANS) 15 MCG/HR 4 patch 3    Sig: Place 1 patch onto the skin once a week.   tiZANidine (ZANAFLEX) 4 MG tablet 30 tablet 2    Sig: Take 1 tablet (4 mg total) by mouth every 6 (six) hours as needed for muscle spasms.     Orders:  No orders of the defined types were placed in this encounter.  Follow-up plan:   Return in about 4 months (around 09/19/2022) for Medication  Management, in person.    Recent Visits No visits were found meeting these conditions. Showing recent visits within past 90 days and meeting all other requirements Today's Visits Date Type Provider Dept  05/21/22 Office Visit Anthony Santa, MD [facility]  Showing today's visits and meeting all other requirements Future Appointments No visits were found meeting these conditions. Showing future appointments within next 90 days and meeting all other requirements  I discussed the assessment and treatment plan with the patient. The patient was provided an opportunity to ask questions and all were answered. The patient agreed with the plan and demonstrated an understanding of the instructions.  Patient advised to call back or seek an in-person evaluation if the symptoms or condition worsens.  Duration of encounter: 65mnutes.  Note by: BGillis Santa MD Date: 05/21/2022; Time: 10:28 AM

## 2022-05-28 ENCOUNTER — Encounter: Payer: Self-pay | Admitting: Physician Assistant

## 2022-05-28 ENCOUNTER — Ambulatory Visit (INDEPENDENT_AMBULATORY_CARE_PROVIDER_SITE_OTHER): Payer: Medicare HMO | Admitting: Physician Assistant

## 2022-05-28 VITALS — BP 151/80 | Temp 98.1°F | Ht 69.0 in | Wt 236.0 lb

## 2022-05-28 DIAGNOSIS — I10 Essential (primary) hypertension: Secondary | ICD-10-CM

## 2022-05-28 DIAGNOSIS — F17200 Nicotine dependence, unspecified, uncomplicated: Secondary | ICD-10-CM

## 2022-05-28 DIAGNOSIS — G40909 Epilepsy, unspecified, not intractable, without status epilepticus: Secondary | ICD-10-CM

## 2022-05-28 DIAGNOSIS — T451X5A Adverse effect of antineoplastic and immunosuppressive drugs, initial encounter: Secondary | ICD-10-CM

## 2022-05-28 DIAGNOSIS — E538 Deficiency of other specified B group vitamins: Secondary | ICD-10-CM

## 2022-05-28 DIAGNOSIS — R739 Hyperglycemia, unspecified: Secondary | ICD-10-CM

## 2022-05-28 DIAGNOSIS — C119 Malignant neoplasm of nasopharynx, unspecified: Secondary | ICD-10-CM

## 2022-05-28 DIAGNOSIS — F5101 Primary insomnia: Secondary | ICD-10-CM

## 2022-05-28 DIAGNOSIS — E782 Mixed hyperlipidemia: Secondary | ICD-10-CM | POA: Diagnosis not present

## 2022-05-28 DIAGNOSIS — R69 Illness, unspecified: Secondary | ICD-10-CM | POA: Diagnosis not present

## 2022-05-28 DIAGNOSIS — Z125 Encounter for screening for malignant neoplasm of prostate: Secondary | ICD-10-CM

## 2022-05-28 DIAGNOSIS — G620 Drug-induced polyneuropathy: Secondary | ICD-10-CM

## 2022-05-28 DIAGNOSIS — J431 Panlobular emphysema: Secondary | ICD-10-CM | POA: Diagnosis not present

## 2022-05-28 NOTE — Assessment & Plan Note (Signed)
In remission, followed by oncology and ENT

## 2022-05-28 NOTE — Assessment & Plan Note (Signed)
Managing with amitriptyline 75 mg currently, pt states does not help, he cannot sleep 2/2 chronic pain. Reports failure with trazodone, made him hallucinate.   Will continue to discuss as pt has concerns

## 2022-05-28 NOTE — Progress Notes (Signed)
New patient visit   Patient: [name]   DOB: [birth year]   72 y.o. Male  MRN: [id number] Visit Date: 05/28/2022  Today's healthcare provider: Mikey Kirschner, PA-C   Cc. New patient establish care  Subjective    [name] is a 72 y.o. male who presents today as a new patient to establish care.  HPI  He has a significant history of nasopharyngeal cancer, s/p chemoradiation 3/21, follows with ENT and oncology. He has neuropathy as a consequence and follows with pain management. He feels his pain levels are not controlled.  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 Does not follow with pulmonology.  Nebulizer treatments 4 times a day to maintain. 05/03/22 he was see in the ED for aphasia, stroke was r/o and the determine cause was hypoxia. He reports he was supposed to get O2 sent home from the hospital but didn't. His primary did not start it.  He reports his last PCP wanted him to do a sleep study but he does not feel he has sleep apnea.   History of a seizure disorder, thought to be brought on by benzo withdrawal and acute encephalopathy. He has been treated for insomnia and anxiety by his last pcp.  Pt is a former smoker-- quit 01/2019, but then looks like he restarted sometime in 2023, quit again in 11/23 but has around 25+ year pack history.   He also reports having a history of Hep C in 2012 and that it was treated successfully.  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[place])    Seizures ([place]) 2010   Sleep apnea    Syncope    questionable vasovagal   Past Surgical History:  Procedure Laterality Date   COLONOSCOPY WITH PROPOFOL N/A 12/16/2016   Procedure: COLONOSCOPY WITH  PROPOFOL;  Surgeon: Lollie Sails, MD;  Location: [facility] ENDOSCOPY;  Service: Endoscopy;  Laterality: N/A;   COLONOSCOPY WITH PROPOFOL N/A 03/14/2017   Procedure: COLONOSCOPY WITH PROPOFOL;  Surgeon: Lollie Sails, MD;  Location[facility]COPY;  Service: Endoscopy;  Laterality: N/A;   COLONOSCOPY WITH PROPOFOL     FOOT SURGERY Right 03/12/2002   FOOT SURGERY     HEMORRHOID SURGERY     LITHOTRIPSY     MYRINGOTOMY     MYRINGOTOMY WITH TUBE PLACEMENT Left 04/28/2019   Procedure: MYRINGOTOMY WITH TUBE PLACEMENT;  Surgeon: Carloyn Manner, MD;  Location: [place];  Service: ENT;  Laterality: Left;   NASOPHARYNGOSCOPY N/A 04/28/2019   Procedure: NASOPHARYNGOSCOPY WITH BIOPSY;  Surgeon: Carloyn Manner, MD;  Location: [place];  Service: ENT;  Laterality: N/A;   NASOPHARYNGOSCOPY     Port a cath Placement     PORTA CATH INSERTION N/A 05/20/2019   Procedure: PORTA CATH INSERTION;  Surgeon: Algernon Huxley, MD;  Location: [place] [facility];  Service: Cardiovascular;  Laterality: N/A;   PORTA CATH REMOVAL N/A 11/29/2020   Procedure: PORTA CATH REMOVAL;  Surgeon: Algernon Huxley, MD;  Location: [place] [facility];  Service: Cardiovascular;  Laterality: N/A;   TONSILLECTOMY     Family Status  Relation Name Status   Mother  (Not Specified)  Father  (Not Specified)   Ethlyn Daniels  (Not Specified)   MGF  (Not Specified)   Family History  Problem Relation Age of Onset   Arthritis Mother    Hypertension Mother    Cancer Father    Kidney disease Father    Alcohol abuse Paternal Aunt    Depression Maternal Grandfather    Social History   Socioeconomic History   Marital status: Divorced    Spouse name: Not on file   Number of children: Not on file   Years of education: Not on file   Highest education level: Not on file  Occupational History   Not on file  Tobacco Use   Smoking status: Former    Packs/day: 0.50    Years: 50.00    Total pack years: 25.00     Types: Cigarettes    Quit date: 02/05/2019    Years since quitting: 3.3    Passive exposure: Past   Smokeless tobacco: Never   Tobacco comments:    4-5 cigs a day (02/04/22)    Quit 1 weeks ago.   Vaping Use   Vaping Use: Never used  Substance and Sexual Activity   Alcohol use: Not Currently    Alcohol/week: 14.0 standard drinks of alcohol    Types: 14 Cans of beer per week   Drug use: Not Currently   Sexual activity: Not Currently  Other Topics Concern   Not on file  Social History Narrative   ** Merged History Encounter **       Social Determinants of Health   Financial Resource Strain: Not on file  Food Insecurity: No Food Insecurity (05/27/2019)   Hunger Vital Sign    Worried About Running Out of Food in the Last Year: Never true    Ran Out of Food in the Last Year: Never true  Transportation Needs: No Transportation Needs (05/27/2019)   PRAPARE - Hydrologist (Medical): No    Lack of Transportation (Non-Medical): No  Physical Activity: Inactive (05/27/2019)   Exercise Vital Sign    Days of Exercise per Week: 0 days    Minutes of Exercise per Session: 0 min  Stress: Not on file  Social Connections: Not on file   Outpatient Medications Prior to Visit  Medication Sig   amitriptyline (ELAVIL) 75 MG tablet Take one tab po qhs for insomnia (Patient taking differently: Take 75 mg by mouth at bedtime. Take one tab po qhs for insomnia)   aspirin EC 81 MG tablet Take 81 mg by mouth daily. Swallow whole.   buprenorphine (BUTRANS) 15 MCG/HR Place 1 patch onto the skin once a week.   Fluticasone-Umeclidin-Vilant (TRELEGY ELLIPTA) 100-62.5-25 MCG/ACT AEPB Inhale 1 puff into the lungs daily.   gabapentin (NEURONTIN) 600 MG tablet TAKE 1 TABLET BY MOUTH EVERY MORNING, 1 TABLET AT NOON, 1 TABLET IN THE EVENING AND 1 TABLET AT BEDTIME AS DIRECTED. *NOTE DOSEAGE CHANGE*   hydrOXYzine (VISTARIL) 25 MG capsule TAKE 1 CAPSULE BY MOUTH EVERY day as needed for  anxiety (Patient taking differently: Take 25 mg by mouth daily as needed for anxiety. TAKE 1 CAPSULE BY MOUTH EVERY day as needed for anxiety)   Ipratropium-Albuterol (COMBIVENT RESPIMAT) 20-100 MCG/ACT AERS respimat Inhale 1 puff into the lungs every 6 (six) hours as needed. Rescue inhaler   ipratropium-albuterol (DUONEB) 0.5-2.5 (3) MG/3ML SOLN Take 3 mLs by nebulization every 4 (four) hours as needed.   omeprazole (PRILOSEC) 40 MG capsule Take 1  capsule (40 mg total) by mouth daily.   revefenacin (YUPELRI) 175 MCG/3ML nebulizer solution Take 3 mLs (175 mcg total) by nebulization daily.   rosuvastatin (CRESTOR) 5 MG tablet TAKE 1 TABLET BY MOUTH DAILY. (Patient taking differently: Take 5 mg by mouth daily.)   tiZANidine (ZANAFLEX) 4 MG tablet Take 1 tablet (4 mg total) by mouth every 6 (six) hours as needed for muscle spasms.   amLODipine (NORVASC) 5 MG tablet TAKE 1 TABLET BY MOUTH DAILY   ammonium lactate (AMLACTIN) 12 % lotion Apply 1 application. topically as needed for dry skin.   antiseptic oral rinse (BIOTENE) LIQD 15 mLs by Mouth Rinse route as needed for dry mouth.   [DISCONTINUED] pilocarpine (SALAGEN) 5 MG tablet Take 1 tablet (5 mg total) by mouth 2 (two) times daily. (Patient not taking: Reported on 05/28/2022)   Facility-Administered Medications Prior to Visit  Medication Dose Route Frequency Provider   heparin lock flush 100 unit/mL  500 Units Intravenous Once Sindy Guadeloupe, MD   Allergies  Allergen Reactions   Opana [Oxymorphone Hcl]     Made him BLACKOUT   Elemental Sulfur     Childhood reaction    Oxymorphone Other (See Comments)   Oxymorphone    Sulfa Antibiotics Other (See Comments)   Sulfa Antibiotics     Immunization History  Administered Date(s) Administered   Hep A / Hep B 04/15/2007   Influenza Inj Mdck Quad Pf 02/11/2022   Influenza-Unspecified 03/01/2020   Moderna SARS-COV2 Booster Vaccination 12/11/2020   Moderna Sars-Covid-2 Vaccination 06/01/2020,  07/07/2020    Health Maintenance  Topic Date Due   Pneumonia Vaccine 7+ Years old (1 - PCV) Never done   DTaP/Tdap/Td (1 - Tdap) Never done   Zoster Vaccines- Shingrix (1 of 2) Never done   COVID-19 Vaccine (3 - Moderna risk series) 01/08/2021   Medicare Annual Wellness (AWV)  08/01/2022   Lung Cancer Screening  12/14/2022   COLONOSCOPY (Pts 45-75yr Insurance coverage will need to be confirmed)  03/15/2027   INFLUENZA VACCINE  Completed   Hepatitis C Screening  Completed   HPV VACCINES  Aged Out    Patient Care Team: DMikey Kirschner PA-C as PCP - General (Physician Assistant) RSindy Guadeloupe MD as Consulting Physician (Hematology and Oncology) CNoreene Filbert MD as Radiation Oncologist (Radiation Oncology) KLavera Guise MD (Internal Medicine)    Objective    Blood pressure (!) 151/80, temperature 98.1 F (36.7 C), height '5\' 9"'$  (1.753 m), weight 236 lb (107 kg), SpO2 98 %.   Physical Exam Constitutional:      General: He is awake.     Appearance: He is well-developed.  HENT:     Head: Normocephalic.  Eyes:     Conjunctiva/sclera: Conjunctivae normal.  Cardiovascular:     Rate and Rhythm: Normal rate and regular rhythm.     Heart sounds: Normal heart sounds.  Pulmonary:     Effort: Pulmonary effort is normal.     Breath sounds: Normal breath sounds.  Musculoskeletal:     Right lower leg: No edema.     Left lower leg: No edema.  Skin:    General: Skin is warm.  Neurological:     Mental Status: He is alert and oriented to person, place, and time.  Psychiatric:        Attention and Perception: Attention normal.        Mood and Affect: Mood normal.        Speech: Speech normal.  Behavior: Behavior is cooperative.     Depression Screen    05/28/2022    2:03 PM 05/21/2022   10:17 AM 05/09/2022   10:23 AM 02/04/2022    9:15 AM  PHQ 2/9 Scores  PHQ - 2 Score 0 0 0 0   No results found for any visits on 05/28/22.  Assessment & Plan      Problem List  Items Addressed This Visit       Cardiovascular and Mediastinum   Essential hypertension    Elevated in office. We did not get a chance to discuss further today. Managed with amlodipine 5 mg. Reviewed last CMP. Would like to f/u 3-4 mo         Respiratory   Nasopharyngeal carcinoma (HCC)    In remission, followed by oncology and ENT       COPD (chronic obstructive pulmonary disease) ([place]) - Primary    Managed with trelegy, combivent and various nebulizer treatments. Advised pt I am concerned over his hypoxic episode -- due to the severity of his COPD referring to pulmonology.       Relevant Orders   Ambulatory referral to Pulmonology     Nervous and Auditory   Seizure disorder ([place]) (Chronic)    Not treated; appears to have been secondary to benzo withdrawal and acute encephalopathy.        Chemotherapy-induced peripheral neuropathy ([place])    Follows with pain management.  Manages with gabapentin, zanaflex, buprenorophine. Pt reports failure to lyrica and Cymbalta.         Other   Tobacco use disorder (Chronic)    Pt has an over 25 year pack history, quit 11/23.  Pt had CT angio 8/23, will discuss LDCT for lung cancer screening in the future. Also ref. To pulm for sever copd      Primary insomnia    Managing with amitriptyline 75 mg currently, pt states does not help, he cannot sleep 2/2 chronic pain. Reports failure with trazodone, made him hallucinate.   Will continue to discuss as pt has concerns      Mixed hyperlipidemia    Manages with crestor 5 mg  LDL goal < 100  , moderately controlled currently.       Vitamin B12 deficiency    Historically, will repeat vit b12 level      Relevant Orders   Vitamin B12   Other Visit Diagnoses     Hyperglycemia       Relevant Orders   HgB A1c   Prostate cancer screening       Relevant Orders   PSA       Return in about 3 months (around 08/27/2022) for hypertension.     I, Mikey Kirschner, PA-C have  reviewed all documentation for this visit. The documentation on  05/28/22 for the exam, diagnosis, procedures, and orders are all accurate and complete.  Mikey Kirschner, PA-C [facility] [address] [place], [place], [address] Office: [phone] Fax: [place]

## 2022-05-28 NOTE — Assessment & Plan Note (Signed)
Elevated in office. We did not get a chance to discuss further today. Managed with amlodipine 5 mg. Reviewed last CMP. Would like to f/u 3-4 mo

## 2022-05-28 NOTE — Assessment & Plan Note (Signed)
Historically, will repeat vit b12 level

## 2022-05-28 NOTE — Assessment & Plan Note (Signed)
Manages with crestor 5 mg  LDL goal < 100  , moderately controlled currently.

## 2022-05-28 NOTE — Assessment & Plan Note (Signed)
Pt has an over 25 year pack history, quit 11/23.  Pt had CT angio 8/23, will discuss LDCT for lung cancer screening in the future. Also ref. To pulm for sever copd

## 2022-05-28 NOTE — Assessment & Plan Note (Signed)
Follows with pain management.  Manages with gabapentin, zanaflex, buprenorophine. Pt reports failure to lyrica and Cymbalta.

## 2022-05-28 NOTE — Assessment & Plan Note (Signed)
Managed with trelegy, combivent and various nebulizer treatments. Advised pt I am concerned over his hypoxic episode -- due to the severity of his COPD referring to pulmonology.

## 2022-05-28 NOTE — Assessment & Plan Note (Signed)
Not treated; appears to have been secondary to benzo withdrawal and acute encephalopathy.

## 2022-05-29 ENCOUNTER — Telehealth: Payer: Self-pay | Admitting: Internal Medicine

## 2022-05-29 LAB — VITAMIN B12: Vitamin B-12: 475 pg/mL (ref 232–1245)

## 2022-05-29 LAB — PSA: Prostate Specific Ag, Serum: 0.4 ng/mL (ref 0.0–4.0)

## 2022-05-29 LAB — HEMOGLOBIN A1C
Est. average glucose Bld gHb Est-mCnc: 146 mg/dL
Hgb A1c MFr Bld: 6.7 % — ABNORMAL HIGH (ref 4.8–5.6)

## 2022-05-29 NOTE — Telephone Encounter (Signed)
Patient self-discharged from office-[name]

## 2022-05-30 ENCOUNTER — Telehealth: Payer: Self-pay | Admitting: Physician Assistant

## 2022-05-30 NOTE — Telephone Encounter (Signed)
Pt is calling to wanting to ask Ria Comment to recommend an alternative amitriptyline. Please advise CB- [phone]

## 2022-05-30 NOTE — Telephone Encounter (Signed)
Patient called to ask why he's asking for an alternative to Amitriptyline. He says he talked to [place] on Tuesday when he was in the office. He says he didn't realize the side effects: constipation, dry mouth ( my saliva glands messed up due to radiation), and weight gain ( I weigh more than ever weighed). He says he saw something on the internet, such as trazodone, that is something to try. Want to know if there is something else out there to help with sleep without all the side effects. When asked does the medicine not help him sleep, he says Amitriptyline has lost it's effect on helping me sleep. I advised I will send this to [facility] for review and recommendation.

## 2022-05-31 ENCOUNTER — Other Ambulatory Visit: Payer: Self-pay | Admitting: Physician Assistant

## 2022-05-31 DIAGNOSIS — F5101 Primary insomnia: Secondary | ICD-10-CM

## 2022-05-31 DIAGNOSIS — F32A Depression, unspecified: Secondary | ICD-10-CM

## 2022-05-31 NOTE — Telephone Encounter (Signed)
Spoke to pt--since Trazodone causing Hallucination in the past is there other alternative for Amitriptyline-causing dry mouth and constipation. Please advise

## 2022-05-31 NOTE — Telephone Encounter (Signed)
Notified pt medication for sleep and referral. Pt voiced understanding.

## 2022-06-11 ENCOUNTER — Telehealth: Payer: Self-pay

## 2022-06-11 ENCOUNTER — Encounter: Payer: Self-pay | Admitting: Student in an Organized Health Care Education/Training Program

## 2022-06-11 ENCOUNTER — Ambulatory Visit (INDEPENDENT_AMBULATORY_CARE_PROVIDER_SITE_OTHER): Payer: Medicare HMO | Admitting: Student in an Organized Health Care Education/Training Program

## 2022-06-11 VITALS — BP 134/78 | HR 91 | Temp 98.0°F | Ht 69.0 in | Wt 233.8 lb

## 2022-06-11 DIAGNOSIS — Z23 Encounter for immunization: Secondary | ICD-10-CM

## 2022-06-11 DIAGNOSIS — R0602 Shortness of breath: Secondary | ICD-10-CM | POA: Diagnosis not present

## 2022-06-11 NOTE — Telephone Encounter (Signed)
Copied from [place] [id number]. Topic: General - Other >> Jun 11, 2022  9:19 AM Chapman Fitch wrote: Reason for CRM: Pt received a bill for DOS 1.16.24/ the amount for $35.79 for an office visit / pt wasn't suppose to receive this bill due to having Dual plan of medicare and medicaid / Ref# [id number]/ please advise

## 2022-06-11 NOTE — Progress Notes (Signed)
Synopsis: Referred in for shortness of breath by Mikey Kirschner, PA-C  Assessment & Plan:   1. Shortness of breath  Presenting to establish care. CT chest from August of 2023 reviewed by me is notable for airway thickening consistent with bronchitis (Smoking at the time). His symptoms of shortness of breath described in November are resolved. I recommend he continue on Trelegy and use his duonebs/combivent as needed. I will obtain pulmonary function testing.   - Pulmonary Function Test [facility] Only; Future  2. Need for prophylactic vaccination against Streptococcus pneumoniae (pneumococcus)  - Pneumococcal conjugate vaccine 20-valent (INOMVEH-20)   Return in about 1 year (around 06/12/2023).  I spent 60 minutes caring for this patient today, including preparing to see the patient, obtaining a medical history , reviewing a separately obtained history, performing a medically appropriate examination and/or evaluation, counseling and educating the patient/family/caregiver, ordering medications, tests, or procedures, documenting clinical information in the electronic health record, and independently interpreting results (not separately reported/billed) and communicating results to the patient/family/caregiver  Armando Reichert, MD [facility] 06/11/2022 10:26 AM    End of visit medications:  No orders of the defined types were placed in this encounter.    Current Outpatient Medications:    amitriptyline (ELAVIL) 75 MG tablet, Take one tab po qhs for insomnia (Patient taking differently: Take 75 mg by mouth at bedtime. Take one tab po qhs for insomnia), Disp: 90 tablet, Rfl: 1   amLODipine (NORVASC) 5 MG tablet, TAKE 1 TABLET BY MOUTH DAILY, Disp: 90 tablet, Rfl: 1   ammonium lactate (AMLACTIN) 12 % lotion, Apply 1 application. topically as needed for dry skin., Disp: 400 g, Rfl: 0   antiseptic oral rinse (BIOTENE) LIQD, 15 mLs by Mouth Rinse route as needed for dry  mouth., Disp: , Rfl:    aspirin EC 81 MG tablet, Take 81 mg by mouth daily. Swallow whole., Disp: , Rfl:    buprenorphine (BUTRANS) 15 MCG/HR, Place 1 patch onto the skin once a week., Disp: 4 patch, Rfl: 3   Fluticasone-Umeclidin-Vilant (TRELEGY ELLIPTA) 100-62.5-25 MCG/ACT AEPB, Inhale 1 puff into the lungs daily., Disp: 3 each, Rfl: 11   gabapentin (NEURONTIN) 600 MG tablet, TAKE 1 TABLET BY MOUTH EVERY MORNING, 1 TABLET AT NOON, 1 TABLET IN THE EVENING AND 1 TABLET AT BEDTIME AS DIRECTED. *NOTE DOSEAGE CHANGE*, Disp: 180 tablet, Rfl: 1   hydrOXYzine (VISTARIL) 25 MG capsule, TAKE 1 CAPSULE BY MOUTH EVERY day as needed for anxiety (Patient taking differently: Take 25 mg by mouth daily as needed for anxiety. TAKE 1 CAPSULE BY MOUTH EVERY day as needed for anxiety), Disp: 30 capsule, Rfl: 2   Ipratropium-Albuterol (COMBIVENT RESPIMAT) 20-100 MCG/ACT AERS respimat, Inhale 1 puff into the lungs every 6 (six) hours as needed. Rescue inhaler, Disp: 4 g, Rfl: 3   ipratropium-albuterol (DUONEB) 0.5-2.5 (3) MG/3ML SOLN, Take 3 mLs by nebulization every 4 (four) hours as needed., Disp: 360 mL, Rfl: 2   omeprazole (PRILOSEC) 40 MG capsule, Take 1 capsule (40 mg total) by mouth daily., Disp: 30 capsule, Rfl: 0   revefenacin (YUPELRI) 175 MCG/3ML nebulizer solution, Take 3 mLs (175 mcg total) by nebulization daily., Disp: 90 mL, Rfl: 2   rosuvastatin (CRESTOR) 5 MG tablet, TAKE 1 TABLET BY MOUTH DAILY. (Patient taking differently: Take 5 mg by mouth daily.), Disp: 90 tablet, Rfl: 3   tiZANidine (ZANAFLEX) 4 MG tablet, Take 1 tablet (4 mg total) by mouth every 6 (six) hours as needed for  muscle spasms., Disp: 30 tablet, Rfl: 2 No current facility-administered medications for this visit.  Facility-Administered Medications Ordered in Other Visits:    heparin lock flush 100 unit/mL, 500 Units, Intravenous, Once, Sindy Guadeloupe, MD   Subjective:   PATIENT ID: [name] GENDER: male DOB: 03-28-1951,  MRN: [id number]  Chief Complaint  Patient presents with   pulmonary consult    Hx of Emphysema. SOB with incline.     HPI  The patient is a pleasant 72 year old male presenting to clinic for the evaluation of shortness of breath.  He reports noticing some shortness of breath in November when he was going up a flight of stairs. This has improved since then. He denies any chest pain or tightness, denies any wheezing, and denies any cough. No sputum production or hemoptysis reported. No fevers, chills, or weight loss. Patient has gained weight and is worked towards weight loss. He was seen in the ED in November for cough and was diagnosed with an URTI. He was seen by his primary care physician multiple times and was started on Trelegy for COPD. He is using it once daily. He is also using his nebulizer multiple times a day as well as Combivent as needed.  Patient is a former smoker and quit in November of 2023. He used to smoke up to a pack a day for up to 50 years. He used to work in [employer].  Ancillary information including prior medications, full medical/surgical/family/social histories, and PFTs (when available) are listed below and have been reviewed.   Review of Systems  Constitutional:  Negative for chills, fever, malaise/fatigue and weight loss.  Respiratory:  Positive for shortness of breath. Negative for cough, hemoptysis, sputum production and wheezing.   Cardiovascular:  Negative for chest pain.    Objective:   Vitals:   06/11/22 1002  BP: 134/78  Pulse: 91  Temp: 98 F (36.7 C)  TempSrc: Temporal  SpO2: 98%  Weight: 233 lb 12.8 oz (106.1 kg)  Height: '5\' 9"'$  (1.753 m)   98% on RA  BMI Readings from Last 3 Encounters:  06/11/22 34.53 kg/m  05/28/22 34.85 kg/m  05/21/22 33.23 kg/m   Wt Readings from Last 3 Encounters:  06/11/22 233 lb 12.8 oz (106.1 kg)  05/28/22 236 lb (107 kg)  05/21/22 225 lb (102.1 kg)    Physical Exam Constitutional:      General:  He is not in acute distress.    Appearance: Normal appearance. He is not ill-appearing.  HENT:     Head: Normocephalic.     Mouth/Throat:     Mouth: Mucous membranes are moist.  Cardiovascular:     Rate and Rhythm: Normal rate and regular rhythm.     Pulses: Normal pulses.     Heart sounds: Normal heart sounds.  Pulmonary:     Effort: Pulmonary effort is normal. No respiratory distress.     Breath sounds: Normal breath sounds. No wheezing, rhonchi or rales.  Abdominal:     General: There is distension.     Palpations: Abdomen is soft.  Musculoskeletal:     Right lower leg: No edema.     Left lower leg: No edema.  Neurological:     General: No focal deficit present.     Mental Status: He is alert and oriented to person, place, and time. Mental status is at baseline.    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[place])    Seizures ([place]) 2010   Sleep apnea    Syncope    questionable vasovagal     Family History  Problem Relation Age of Onset   Arthritis Mother    Hypertension Mother    Cancer Father    Kidney disease Father    Alcohol abuse Paternal Aunt    Depression Maternal Grandfather      Past Surgical History:  Procedure Laterality Date   COLONOSCOPY WITH PROPOFOL N/A 12/16/2016   Procedure: COLONOSCOPY WITH PROPOFOL;  Surgeon: Lollie Sails, MD;  Location: [facility] ENDOSCOPY;  Service: Endoscopy;  Laterality: N/A;   COLONOSCOPY WITH PROPOFOL N/A 03/14/2017   Procedure: COLONOSCOPY WITH PROPOFOL;  Surgeon: Lollie Sails, MD;  Location: [facility] ENDOSCOPY;  Service: Endoscopy;  Laterality: N/A;   COLONOSCOPY WITH PROPOFOL     FOOT SURGERY Right 03/12/2002   FOOT SURGERY     HEMORRHOID  SURGERY     LITHOTRIPSY     MYRINGOTOMY     MYRINGOTOMY WITH TUBE PLACEMENT Left 04/28/2019   Procedure: MYRINGOTOMY WITH TUBE PLACEMENT;  Surgeon: Carloyn Manner, MD;  Location: [place];  Service: ENT;  Laterality: Left;   NASOPHARYNGOSCOPY N/A 04/28/2019   Procedure: NASOPHARYNGOSCOPY WITH BIOPSY;  Surgeon: Carloyn Manner, MD;  Location: [place];  Service: ENT;  Laterality: N/A;   NASOPHARYNGOSCOPY     Port a cath Placement     PORTA CATH INSERTION N/A 05/20/2019   Procedure: PORTA CATH INSERTION;  Surgeon: Algernon Huxley, MD;  Location: [place] [facility];  Service: Cardiovascular;  Laterality: N/A;   PORTA CATH REMOVAL N/A 11/29/2020   Procedure: PORTA CATH REMOVAL;  Surgeon: Algernon Huxley, MD;  Location: [place] [facility];  Service: Cardiovascular;  Laterality: N/A;   TONSILLECTOMY      Social History   Socioeconomic History   Marital status: Divorced    Spouse name: Not on file   Number of children: Not on file   Years of education: Not on file   Highest education level: Not on file  Occupational History   Not on file  Tobacco Use   Smoking status: Former    Packs/day: 0.50    Years: 50.00    Total pack years: 25.00    Types: Cigarettes    Quit date: 04/04/2022    Years since quitting: 0.1    Passive exposure: Past   Smokeless tobacco: Never  Vaping Use   Vaping Use: Never used  Substance and Sexual Activity   Alcohol use: Not Currently    Alcohol/week: 14.0 standard drinks of alcohol    Types: 14 Cans of beer per week   Drug use: Not Currently   Sexual activity: Not Currently  Other Topics Concern   Not on file  Social History Narrative   ** Merged History Encounter **       Social Determinants of Health   Financial Resource Strain: Not on file  Food Insecurity: No Food Insecurity (05/27/2019)   Hunger Vital Sign    Worried About Running Out of Food in the Last Year: Never true    Ran Out of Food in the Last Year: Never true   Transportation Needs: No Transportation Needs (05/27/2019)  PRAPARE - Hydrologist (Medical): No    Lack of Transportation (Non-Medical): No  Physical Activity: Inactive (05/27/2019)   Exercise Vital Sign    Days of Exercise per Week: 0 days    Minutes of Exercise per Session: 0 min  Stress: Not on file  Social Connections: Not on file  Intimate Partner Violence: Not At Risk (05/27/2019)   Humiliation, Afraid, Rape, and Kick questionnaire    Fear of Current or Ex-Partner: No    Emotionally Abused: No    Physically Abused: No    Sexually Abused: No     Allergies  Allergen Reactions   Opana [Oxymorphone Hcl]     Made him BLACKOUT   Elemental Sulfur     Childhood reaction    Oxymorphone Other (See Comments)   Oxymorphone    Sulfa Antibiotics Other (See Comments)   Sulfa Antibiotics      CBC    Component Value Date/Time   WBC 6.8 05/17/2022 1321   RBC 4.26 05/17/2022 1321   HGB 12.6 (L) 05/17/2022 1321   HGB 16.6 10/26/2018 0937   HCT 37.7 (L) 05/17/2022 1321   HCT 48.6 10/26/2018 0937   PLT 204 05/17/2022 1321   PLT 187 10/26/2018 0937   MCV 88.5 05/17/2022 1321   MCV 91 10/26/2018 0937   MCV 94 12/31/2013 1207   MCH 29.6 05/17/2022 1321   MCHC 33.4 05/17/2022 1321   RDW 13.9 05/17/2022 1321   RDW 13.1 10/26/2018 0937   RDW 14.3 12/31/2013 1207   LYMPHSABS 1.1 05/17/2022 1321   LYMPHSABS 2.8 06/08/2013 0423   MONOABS 0.5 05/17/2022 1321   MONOABS 0.9 06/08/2013 0423   EOSABS 0.2 05/17/2022 1321   EOSABS 0.1 06/08/2013 0423   BASOSABS 0.0 05/17/2022 1321   BASOSABS 0.0 06/08/2013 0423    Pulmonary Functions Testing Results:     No data to display          Outpatient Medications Prior to Visit  Medication Sig Dispense Refill   amitriptyline (ELAVIL) 75 MG tablet Take one tab po qhs for insomnia (Patient taking differently: Take 75 mg by mouth at bedtime. Take one tab po qhs for insomnia) 90 tablet 1   amLODipine (NORVASC) 5  MG tablet TAKE 1 TABLET BY MOUTH DAILY 90 tablet 1   ammonium lactate (AMLACTIN) 12 % lotion Apply 1 application. topically as needed for dry skin. 400 g 0   antiseptic oral rinse (BIOTENE) LIQD 15 mLs by Mouth Rinse route as needed for dry mouth.     aspirin EC 81 MG tablet Take 81 mg by mouth daily. Swallow whole.     buprenorphine (BUTRANS) 15 MCG/HR Place 1 patch onto the skin once a week. 4 patch 3   Fluticasone-Umeclidin-Vilant (TRELEGY ELLIPTA) 100-62.5-25 MCG/ACT AEPB Inhale 1 puff into the lungs daily. 3 each 11   gabapentin (NEURONTIN) 600 MG tablet TAKE 1 TABLET BY MOUTH EVERY MORNING, 1 TABLET AT NOON, 1 TABLET IN THE EVENING AND 1 TABLET AT BEDTIME AS DIRECTED. *NOTE DOSEAGE CHANGE* 180 tablet 1   hydrOXYzine (VISTARIL) 25 MG capsule TAKE 1 CAPSULE BY MOUTH EVERY day as needed for anxiety (Patient taking differently: Take 25 mg by mouth daily as needed for anxiety. TAKE 1 CAPSULE BY MOUTH EVERY day as needed for anxiety) 30 capsule 2   Ipratropium-Albuterol (COMBIVENT RESPIMAT) 20-100 MCG/ACT AERS respimat Inhale 1 puff into the lungs every 6 (six) hours as needed. Rescue inhaler 4 g 3  ipratropium-albuterol (DUONEB) 0.5-2.5 (3) MG/3ML SOLN Take 3 mLs by nebulization every 4 (four) hours as needed. 360 mL 2   omeprazole (PRILOSEC) 40 MG capsule Take 1 capsule (40 mg total) by mouth daily. 30 capsule 0   revefenacin (YUPELRI) 175 MCG/3ML nebulizer solution Take 3 mLs (175 mcg total) by nebulization daily. 90 mL 2   rosuvastatin (CRESTOR) 5 MG tablet TAKE 1 TABLET BY MOUTH DAILY. (Patient taking differently: Take 5 mg by mouth daily.) 90 tablet 3   tiZANidine (ZANAFLEX) 4 MG tablet Take 1 tablet (4 mg total) by mouth every 6 (six) hours as needed for muscle spasms. 30 tablet 2   Facility-Administered Medications Prior to Visit  Medication Dose Route Frequency Provider Last Rate Last Admin   heparin lock flush 100 unit/mL  500 Units Intravenous Once Sindy Guadeloupe, MD

## 2022-06-19 DIAGNOSIS — J449 Chronic obstructive pulmonary disease, unspecified: Secondary | ICD-10-CM | POA: Diagnosis not present

## 2022-06-24 NOTE — Telephone Encounter (Signed)
Sent to [employer]illing to submit to Medicaid.

## 2022-06-28 ENCOUNTER — Encounter: Payer: Self-pay | Admitting: Physician Assistant

## 2022-06-28 ENCOUNTER — Ambulatory Visit (INDEPENDENT_AMBULATORY_CARE_PROVIDER_SITE_OTHER): Payer: Medicare HMO | Admitting: Physician Assistant

## 2022-06-28 VITALS — BP 129/64 | HR 69 | Temp 98.2°F | Resp 16 | Wt 233.3 lb

## 2022-06-28 DIAGNOSIS — G620 Drug-induced polyneuropathy: Secondary | ICD-10-CM

## 2022-06-28 DIAGNOSIS — E119 Type 2 diabetes mellitus without complications: Secondary | ICD-10-CM | POA: Diagnosis not present

## 2022-06-28 DIAGNOSIS — T451X5A Adverse effect of antineoplastic and immunosuppressive drugs, initial encounter: Secondary | ICD-10-CM | POA: Diagnosis not present

## 2022-06-28 DIAGNOSIS — I10 Essential (primary) hypertension: Secondary | ICD-10-CM

## 2022-06-28 MED ORDER — BLOOD GLUCOSE TEST VI STRP: 1.0000 | ORAL_STRIP | Freq: Three times a day (TID) | 0 refills | Status: AC

## 2022-06-28 MED ORDER — BLOOD GLUCOSE MONITORING SUPPL DEVI: 1.0000 | Freq: Three times a day (TID) | 0 refills | Status: DC

## 2022-06-28 MED ORDER — LANCET DEVICE MISC: 1.0000 | Freq: Three times a day (TID) | 0 refills | Status: AC

## 2022-06-28 NOTE — Assessment & Plan Note (Addendum)
Chronic well controlled Managed with amlodipine 5 mg  F/u 6 mo

## 2022-06-28 NOTE — Assessment & Plan Note (Addendum)
A1c in screening labs 6.7% Educated on disease and how it related to other comorbidities  Discussed medication vs diet/exercise/lifestyle changes Pt prefers to start lifestyle changes,  On statin  Uacr ordered today Referred to podiatry given pt already has peripheral neuropathy 2/2 chemo   f/u in 4 mo for repeat a1c

## 2022-06-28 NOTE — Progress Notes (Signed)
I,Sulibeya S Dimas,acting as a Education administrator for [employer], PA-C.,have documented all relevant documentation on the behalf of Mikey Kirschner, PA-C,as directed by  Mikey Kirschner, PA-C while in the presence of Mikey Kirschner, PA-C.     Established patient visit   Patient: [name]   DOB: 1950/05/29   72 y.o. Male  MRN: [id number] Visit Date: 06/28/2022  Today's healthcare provider: Mikey Kirschner, PA-C   Chief Complaint  Patient presents with   Hypertension   Diabetes   Subjective    Diabetes He presents for his initial diabetic visit. He has type 2 diabetes mellitus. Associated symptoms include foot paresthesias and polydipsia. Pertinent negatives for diabetes include no blurred vision, no chest pain, no fatigue, no foot ulcerations, no polyphagia, no polyuria, no visual change, no weakness and no weight loss. Symptoms are stable. When asked about current treatments, none were reported. He is following a low fat/cholesterol diet. He participates in exercise three times a week.    Hypertension, follow-up  BP Readings from Last 3 Encounters:  06/28/22 129/64  06/11/22 134/78  05/28/22 (!) 151/80   Wt Readings from Last 3 Encounters:  06/28/22 233 lb 4.8 oz (105.8 kg)  06/11/22 233 lb 12.8 oz (106.1 kg)  05/28/22 236 lb (107 kg)     He was last seen for hypertension 1 months ago.  BP at that visit was 151/80. Management since that visit includes no changes. Continue amlodipine 5 mg daily.  He reports excellent compliance with treatment. He is not having side effects.   Outside blood pressures are not being checked.   Pertinent labs Lab Results  Component Value Date   CHOL 180 05/04/2022   HDL 57 05/04/2022   LDLCALC 107 (H) 05/04/2022   TRIG 79 05/04/2022   CHOLHDL 3.2 05/04/2022   Lab Results  Component Value Date   NA 139 05/17/2022   K 4.7 05/17/2022   CREATININE 1.07 05/17/2022   GFRNONAA >60 05/17/2022   GLUCOSE 125 (H) 05/17/2022   TSH 0.769  12/10/2019     The 10-year ASCVD risk score (Arnett DK, et al., 2019) is: 37.8%  ---------------------------------------------------------------------------------------------------   Medications: Outpatient Medications Prior to Visit  Medication Sig   amitriptyline (ELAVIL) 75 MG tablet Take one tab po qhs for insomnia (Patient taking differently: Take 75 mg by mouth at bedtime. Take one tab po qhs for insomnia)   amLODipine (NORVASC) 5 MG tablet TAKE 1 TABLET BY MOUTH DAILY   ammonium lactate (AMLACTIN) 12 % lotion Apply 1 application. topically as needed for dry skin.   antiseptic oral rinse (BIOTENE) LIQD 15 mLs by Mouth Rinse route as needed for dry mouth.   aspirin EC 81 MG tablet Take 81 mg by mouth daily. Swallow whole.   buprenorphine (BUTRANS) 15 MCG/HR Place 1 patch onto the skin once a week.   Fluticasone-Umeclidin-Vilant (TRELEGY ELLIPTA) 100-62.5-25 MCG/ACT AEPB Inhale 1 puff into the lungs daily.   gabapentin (NEURONTIN) 600 MG tablet TAKE 1 TABLET BY MOUTH EVERY MORNING, 1 TABLET AT NOON, 1 TABLET IN THE EVENING AND 1 TABLET AT BEDTIME AS DIRECTED. *NOTE DOSEAGE CHANGE*   Ipratropium-Albuterol (COMBIVENT RESPIMAT) 20-100 MCG/ACT AERS respimat Inhale 1 puff into the lungs every 6 (six) hours as needed. Rescue inhaler   omeprazole (PRILOSEC) 40 MG capsule Take 1 capsule (40 mg total) by mouth daily.   revefenacin (YUPELRI) 175 MCG/3ML nebulizer solution Take 3 mLs (175 mcg total) by nebulization daily.   rosuvastatin (CRESTOR) 5 MG tablet TAKE  1 TABLET BY MOUTH DAILY. (Patient taking differently: Take 5 mg by mouth daily.)   tiZANidine (ZANAFLEX) 4 MG tablet Take 1 tablet (4 mg total) by mouth every 6 (six) hours as needed for muscle spasms.   hydrOXYzine (VISTARIL) 25 MG capsule TAKE 1 CAPSULE BY MOUTH EVERY day as needed for anxiety (Patient taking differently: Take 25 mg by mouth daily as needed for anxiety. TAKE 1 CAPSULE BY MOUTH EVERY day as needed for anxiety)    ipratropium-albuterol (DUONEB) 0.5-2.5 (3) MG/3ML SOLN Take 3 mLs by nebulization every 4 (four) hours as needed.   Facility-Administered Medications Prior to Visit  Medication Dose Route Frequency Provider   heparin lock flush 100 unit/mL  500 Units Intravenous Once Sindy Guadeloupe, MD    Review of Systems  Constitutional:  Negative for fatigue and weight loss.  Eyes:  Negative for blurred vision.  Cardiovascular:  Negative for chest pain.  Endocrine: Positive for polydipsia. Negative for polyphagia and polyuria.  Neurological:  Negative for weakness.     Objective    BP 129/64 (BP Location: Left Arm, Patient Position: Sitting, Cuff Size: Large)   Pulse 69   Temp 98.2 F (36.8 C) (Temporal)   Resp 16   Wt 233 lb 4.8 oz (105.8 kg)   SpO2 100%   BMI 34.45 kg/m    Physical Exam Vitals reviewed.  Constitutional:      Appearance: He is not ill-appearing.  HENT:     Head: Normocephalic.  Eyes:     Conjunctiva/sclera: Conjunctivae normal.  Cardiovascular:     Rate and Rhythm: Normal rate.  Pulmonary:     Effort: Pulmonary effort is normal. No respiratory distress.  Neurological:     General: No focal deficit present.     Mental Status: He is alert and oriented to person, place, and time.  Psychiatric:        Mood and Affect: Mood normal.        Behavior: Behavior normal.      No results found for any visits on 06/28/22.  Assessment & Plan     Problem List Items Addressed This Visit       Cardiovascular and Mediastinum   Essential hypertension    Chronic well controlled Managed with amlodipine 5 mg  F/u 6 mo         Endocrine   Type 2 diabetes mellitus without complication, without long-term current use of insulin (HCC) - Primary    A1c in screening labs 6.7% Educated on disease and how it related to other comorbidities  Discussed medication vs diet/exercise/lifestyle changes Pt prefers to start lifestyle changes,  On statin  Uacr ordered today Referred  to podiatry given pt already has peripheral neuropathy 2/2 chemo   f/u in 4 mo for repeat a1c      Relevant Medications   Blood Glucose Monitoring Suppl DEVI   Glucose Blood (BLOOD GLUCOSE TEST STRIPS) STRP   Lancet Device MISC   Other Relevant Orders   Ambulatory referral to Podiatry   Urine Microalbumin w/creat. ratio     Nervous and Auditory   Chemotherapy-induced peripheral neuropathy ([place])   Relevant Orders   Ambulatory referral to Podiatry    Return in about 4 months (around 10/27/2022) for DMII.      I, Mikey Kirschner, PA-C have reviewed all documentation for this visit. The documentation on  06/28/22 for the exam, diagnosis, procedures, and orders are all accurate and complete.  Mikey Kirschner, PA-C [facility] [phone]  [id number] [place], [place], [address] Office: [phone] Fax: [place]

## 2022-06-30 LAB — MICROALBUMIN / CREATININE URINE RATIO
Creatinine, Urine: 32.1 mg/dL
Microalb/Creat Ratio: <9 mg/g creat (ref 0–29)
Microalbumin, Urine: <3 ug/mL

## 2022-07-18 DIAGNOSIS — J449 Chronic obstructive pulmonary disease, unspecified: Secondary | ICD-10-CM | POA: Diagnosis not present

## 2022-07-24 ENCOUNTER — Other Ambulatory Visit: Payer: Self-pay | Admitting: Physician Assistant

## 2022-07-24 ENCOUNTER — Telehealth: Payer: Self-pay | Admitting: Physician Assistant

## 2022-07-24 DIAGNOSIS — B370 Candidal stomatitis: Secondary | ICD-10-CM

## 2022-07-24 MED ORDER — NYSTATIN 100000 UNIT/ML MT SUSP: 5.0000 mL | Freq: Three times a day (TID) | OROMUCOSAL | 1 refills | Status: DC | PRN

## 2022-07-24 NOTE — Telephone Encounter (Signed)
Deatiled VM left per DPR.  Ok for [facility] to advise if patient returns call

## 2022-07-24 NOTE — Telephone Encounter (Signed)
The patient called in stating his previous doctor had prescribed him magic mouthwash because it helped with his dry mouth from the Fluticasone-Umeclidin-Vilant (TRELEGY ELLIPTA) 100-62.5-25 MCG/ACT AEPB . He is hoping his provider can write him a new prescription for the magic mouthwash as the previous one is outdated and the pharmacy told him he would need a new one. He uses    [place], [place] Phone: [phone]  Fax: [phone]     Please assist patient further

## 2022-07-30 ENCOUNTER — Other Ambulatory Visit: Payer: Self-pay | Admitting: Nurse Practitioner

## 2022-07-30 ENCOUNTER — Other Ambulatory Visit: Payer: Self-pay | Admitting: Family Medicine

## 2022-07-30 DIAGNOSIS — K219 Gastro-esophageal reflux disease without esophagitis: Secondary | ICD-10-CM

## 2022-07-31 NOTE — Telephone Encounter (Signed)
Requested medication (s) are due for refill today: yes  Requested medication (s) are on the active medication list: yes  Last refill:  05/14/22  Future visit scheduled: yes  Notes to clinic:  Unable to refill per protocol, last refill by another provider.      Requested Prescriptions  Pending Prescriptions Disp Refills   omeprazole (PRILOSEC) 40 MG capsule [Pharmacy Med Name: OMEPRAZOLE DR 40 MG CAP] 30 capsule 0    Sig: TAKE 1 CAPSULE BY MOUTH DAILY     Gastroenterology: Proton Pump Inhibitors Passed - 07/30/2022 12:22 PM      Passed - Valid encounter within last 12 months    Recent Outpatient Visits           1 month ago Type 2 diabetes mellitus without complication, without long-term current use of insulin [facility])   [place] Mikey Kirschner, PA-C   2 months ago Panlobular emphysema [facility])   [place] Mikey Kirschner, PA-C       Future Appointments             In 2 months Thedore Mins, [place] [facility], [place]

## 2022-08-06 ENCOUNTER — Ambulatory Visit: Payer: Medicare HMO | Admitting: Internal Medicine

## 2022-08-08 ENCOUNTER — Ambulatory Visit: Payer: Medicare HMO | Admitting: Physician Assistant

## 2022-08-18 DIAGNOSIS — J449 Chronic obstructive pulmonary disease, unspecified: Secondary | ICD-10-CM | POA: Diagnosis not present

## 2022-08-22 ENCOUNTER — Encounter: Payer: Self-pay | Admitting: Physician Assistant

## 2022-08-22 ENCOUNTER — Ambulatory Visit (INDEPENDENT_AMBULATORY_CARE_PROVIDER_SITE_OTHER): Payer: Medicare HMO | Admitting: Physician Assistant

## 2022-08-22 ENCOUNTER — Telehealth: Payer: Self-pay | Admitting: Physician Assistant

## 2022-08-22 VITALS — BP 139/79 | HR 80 | Wt 220.4 lb

## 2022-08-22 DIAGNOSIS — F5101 Primary insomnia: Secondary | ICD-10-CM | POA: Diagnosis not present

## 2022-08-22 DIAGNOSIS — F430 Acute stress reaction: Secondary | ICD-10-CM | POA: Diagnosis not present

## 2022-08-22 DIAGNOSIS — F339 Major depressive disorder, recurrent, unspecified: Secondary | ICD-10-CM

## 2022-08-22 DIAGNOSIS — R69 Illness, unspecified: Secondary | ICD-10-CM | POA: Diagnosis not present

## 2022-08-22 DIAGNOSIS — J431 Panlobular emphysema: Secondary | ICD-10-CM

## 2022-08-22 MED ORDER — HYDROXYZINE PAMOATE 25 MG PO CAPS: ORAL_CAPSULE | ORAL | 2 refills | Status: DC

## 2022-08-22 NOTE — Telephone Encounter (Signed)
Submitted PA [id number]

## 2022-08-22 NOTE — Telephone Encounter (Addendum)
Pt stated he just received a call from [facility]. PA is needed for the medication hydrOXYzine (VISTARIL) 25 MG capsule .  Stated he really needs some relief. Is asking for an alternative.

## 2022-08-22 NOTE — Patient Instructions (Signed)
[facility] at [facility]. [place],  [place]  [address]  Main: [phone]

## 2022-08-22 NOTE — Progress Notes (Signed)
I,Sha'taria Tyson,acting as a Neurosurgeonscribe for Eastman KodakLindsay Lenward Able, PA-C.,have documented all relevant documentation on the behalf of Alfredia FergusonLindsay Breindel Collier, PA-C,as directed by  Alfredia FergusonLindsay Chantry Headen, PA-C while in the presence of Alfredia FergusonLindsay Terriana Barreras, PA-C.   Established patient visit   Patient: [name]   DOB: 11-07-1950   72 y.o. Male  MRN: [id number] Visit Date: 08/22/2022  Today's healthcare provider: Alfredia FergusonLindsay Courtenay Creger, PA-C   Cc. Increased stress level  Subjective    HPI   Pt reports having issues with mold in his home, has not had central heat or air conditioning. He is being evicted as he was accused of smoking in his home which is not allowed. He reports fighting with maintenance regarding living conditions-- including roaches, lack of heat, mold. Pt is concerned his mold exposure if worsening his COPD.  Due to his he has been stressed, lightheaded, and not sleeping much.   Pt would like this documented in case of ongoing legal proceedings.   Medications: Outpatient Medications Prior to Visit  Medication Sig   amitriptyline (ELAVIL) 75 MG tablet Take one tab po qhs for insomnia (Patient taking differently: Take 75 mg by mouth at bedtime. Take one tab po qhs for insomnia)   amLODipine (NORVASC) 5 MG tablet TAKE 1 TABLET BY MOUTH DAILY   ammonium lactate (AMLACTIN) 12 % lotion Apply 1 application. topically as needed for dry skin.   antiseptic oral rinse (BIOTENE) LIQD 15 mLs by Mouth Rinse route as needed for dry mouth.   aspirin EC 81 MG tablet Take 81 mg by mouth daily. Swallow whole.   Blood Glucose Monitoring Suppl DEVI 1 each by Does not apply route in the morning, at noon, and at bedtime. May substitute to any manufacturer covered by patient's insurance.   buprenorphine (BUTRANS) 15 MCG/HR Place 1 patch onto the skin once a week.   Fluticasone-Umeclidin-Vilant (TRELEGY ELLIPTA) 100-62.5-25 MCG/ACT AEPB Inhale 1 puff into the lungs daily.   gabapentin (NEURONTIN) 600 MG tablet TAKE 1  TABLET BY MOUTH EVERY MORNING, 1 TABLET AT NOON, 1 TABLET IN THE EVENING AND 1 TABLET AT BEDTIME AS DIRECTED. *NOTE DOSEAGE CHANGE*   Ipratropium-Albuterol (COMBIVENT RESPIMAT) 20-100 MCG/ACT AERS respimat Inhale 1 puff into the lungs every 6 (six) hours as needed. Rescue inhaler   ipratropium-albuterol (DUONEB) 0.5-2.5 (3) MG/3ML SOLN Take 3 mLs by nebulization every 4 (four) hours as needed.   magic mouthwash (nystatin, lidocaine, diphenhydrAMINE, alum & mag hydroxide) suspension Swish and spit 5 mLs 3 (three) times daily as needed for mouth pain.   omeprazole (PRILOSEC) 40 MG capsule TAKE 1 CAPSULE BY MOUTH DAILY   revefenacin (YUPELRI) 175 MCG/3ML nebulizer solution Take 3 mLs (175 mcg total) by nebulization daily.   rosuvastatin (CRESTOR) 5 MG tablet TAKE 1 TABLET BY MOUTH DAILY. (Patient taking differently: Take 5 mg by mouth daily.)   tiZANidine (ZANAFLEX) 4 MG tablet Take 1 tablet (4 mg total) by mouth every 6 (six) hours as needed for muscle spasms.   [DISCONTINUED] hydrOXYzine (VISTARIL) 25 MG capsule TAKE 1 CAPSULE BY MOUTH EVERY day as needed for anxiety (Patient not taking: Reported on 08/22/2022)   Facility-Administered Medications Prior to Visit  Medication Dose Route Frequency Provider   heparin lock flush 100 unit/mL  500 Units Intravenous Once Creig Hinesao, Archana C, MD    Review of Systems  Constitutional:  Negative for fatigue and fever.  Respiratory:  Negative for cough and shortness of breath.   Cardiovascular:  Negative for chest pain, palpitations  and leg swelling.  Neurological:  Negative for dizziness and headaches.  Psychiatric/Behavioral:  Positive for agitation, behavioral problems and sleep disturbance. The patient is nervous/anxious.       Objective    BP 139/79 (BP Location: Right Arm, Patient Position: Sitting, Cuff Size: Normal)   Pulse 80   Wt 220 lb 6.4 oz (100 kg)   SpO2 98%   BMI 32.55 kg/m   Physical Exam Vitals reviewed.  Constitutional:       Appearance: He is not ill-appearing.  HENT:     Head: Normocephalic.  Eyes:     Conjunctiva/sclera: Conjunctivae normal.  Cardiovascular:     Rate and Rhythm: Normal rate.  Pulmonary:     Effort: Pulmonary effort is normal. No respiratory distress.  Neurological:     General: No focal deficit present.     Mental Status: He is alert and oriented to person, place, and time.  Psychiatric:        Mood and Affect: Mood is anxious. Affect is angry.        Behavior: Behavior normal.     No results found for any visits on 08/22/22.  Assessment & Plan     1. Acute stress reaction Due to poor living situations Refilled vistaril prn use  2. Recurrent depression Exacerbated w/ current living situation  3. Primary insomnia Exacerbated by current living situation  4. Panlobular emphysema Chronic condition potentially exacerbated by living situation  Return if symptoms worsen or fail to improve.      I, Alfredia Ferguson, PA-C have reviewed all documentation for this visit. The documentation on  08/22/22  for the exam, diagnosis, procedures, and orders are all accurate and complete.  Alfredia Ferguson, PA-C [facility] [address] [place], [place], [address] Office: [phone] Fax: [phone]   [facility]

## 2022-08-26 ENCOUNTER — Other Ambulatory Visit: Payer: Self-pay | Admitting: Internal Medicine

## 2022-08-26 DIAGNOSIS — Z760 Encounter for issue of repeat prescription: Secondary | ICD-10-CM

## 2022-08-27 ENCOUNTER — Telehealth: Payer: Self-pay

## 2022-08-27 NOTE — Telephone Encounter (Signed)
Copied from CRM [id number]. Topic: General - Inquiry >> Aug 27, 2022 12:49 PM De [place]anch wrote: Reason for CRM: Pt asked if PCP would be willing to fill out the [employer] form for him. He stated that he is trying to apply for some aid.  Pt asked for a callback to discuss.

## 2022-08-28 NOTE — Telephone Encounter (Signed)
Patient advised. Verbalized understanding

## 2022-08-28 NOTE — Telephone Encounter (Signed)
Disregard last note.  Patient advised in detailed voicemail as listed in [facility]

## 2022-08-28 NOTE — Telephone Encounter (Signed)
Ok will wait and see

## 2022-08-28 NOTE — Telephone Encounter (Signed)
Pt is calling in requesting an email address to send the text he received to. Pt says he can't send it through MyChart and is currently unable to access it due to computer issues and is not interested in putting the app on his phone. Please advise.

## 2022-09-09 ENCOUNTER — Other Ambulatory Visit: Payer: Self-pay | Admitting: Physician Assistant

## 2022-09-09 DIAGNOSIS — I10 Essential (primary) hypertension: Secondary | ICD-10-CM

## 2022-09-12 ENCOUNTER — Ambulatory Visit
Payer: Medicare HMO | Attending: Student in an Organized Health Care Education/Training Program | Admitting: Student in an Organized Health Care Education/Training Program

## 2022-09-12 ENCOUNTER — Encounter: Payer: Self-pay | Admitting: Student in an Organized Health Care Education/Training Program

## 2022-09-12 VITALS — BP 133/61 | HR 76 | Temp 99.1°F | Ht 69.0 in | Wt 220.0 lb

## 2022-09-12 DIAGNOSIS — G5641 Causalgia of right upper limb: Secondary | ICD-10-CM | POA: Diagnosis not present

## 2022-09-12 DIAGNOSIS — G620 Drug-induced polyneuropathy: Secondary | ICD-10-CM | POA: Diagnosis not present

## 2022-09-12 DIAGNOSIS — G894 Chronic pain syndrome: Secondary | ICD-10-CM | POA: Diagnosis not present

## 2022-09-12 DIAGNOSIS — D701 Agranulocytosis secondary to cancer chemotherapy: Secondary | ICD-10-CM | POA: Insufficient documentation

## 2022-09-12 DIAGNOSIS — C119 Malignant neoplasm of nasopharynx, unspecified: Secondary | ICD-10-CM | POA: Diagnosis not present

## 2022-09-12 DIAGNOSIS — F321 Major depressive disorder, single episode, moderate: Secondary | ICD-10-CM | POA: Diagnosis not present

## 2022-09-12 DIAGNOSIS — M5442 Lumbago with sciatica, left side: Secondary | ICD-10-CM | POA: Insufficient documentation

## 2022-09-12 DIAGNOSIS — T451X5A Adverse effect of antineoplastic and immunosuppressive drugs, initial encounter: Secondary | ICD-10-CM | POA: Insufficient documentation

## 2022-09-12 DIAGNOSIS — M5441 Lumbago with sciatica, right side: Secondary | ICD-10-CM | POA: Diagnosis not present

## 2022-09-12 MED ORDER — BUPRENORPHINE 15 MCG/HR TD PTWK: 1.0000 | MEDICATED_PATCH | TRANSDERMAL | 2 refills | Status: DC

## 2022-09-12 NOTE — Progress Notes (Signed)
PROVIDER NOTE: Information contained herein reflects review and annotations entered in association with encounter. Interpretation of such information and data should be left to medically-trained personnel. Information provided to patient can be located elsewhere in the medical record under "Patient Instructions". Document created using STT-dictation technology, any transcriptional errors that may result from process are unintentional.    Patient: [name]  Service Category: E/M  Provider: Edward Jolly, MD  DOB: [birth year]  DOS: 09/12/2022  Specialty: Interventional Pain Management  MRN: [id number]  Setting: Ambulatory outpatient  PCP: Alfredia Ferguson, PA-C  Type: Established Patient    Referring Provider: Lyndon Code, MD  Location: Office  Delivery: Face-to-face     HPI  [name], a 72 y.o. 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 [name] primary complain today is Back Pain (lower) Last encounter: My last encounter with him was on 05/21/22  Pertinent problems: [name] has Seizure disorder [facility]); Alcohol abuse; Tobacco use disorder; Nasopharyngeal carcinoma (HCC); and Chronic pain syndrome on their pertinent problem list. Pain Assessment: Severity of Chronic pain is reported as a 6 /10. Location: Foot (face pain) Right, Left/feet radiate up into the legs. Onset: More than a month ago. Quality: Discomfort, Constant, Stabbing. Timing: Constant. Modifying factor(s): nothing currently Vitals:  height is 5\' 9"  (1.753 m) and weight is 220 lb (99.8 kg). His temporal temperature is 99.1 F (37.3 C). His blood pressure is 133/61 and his pulse is 76. His oxygen saturation is 97%.   Reason for encounter: MM and discussion of increased right foot pain consistent with CRPS  Increased low back pain, chronic Right foot pain that is becoming more severe and stabbing in nature, previously had right  foot surgery, hardware in place Allodynia present,  mild swelling, occasional discoloration, and limited ROM Discussed right lumbar sympathetic nerve block  05/21/22 [name] presents today for medication management.  He was hospitalized in late December when he presented to the emergency department on 05/03/2022 for aphasia and there was concern for stroke.  Stroke workup was negative.   He continues to endorse paresthesias of bilateral feet.  He finds this difficult to manage.  He is optimized on gabapentin and I do not recommend further dose escalation there.  Amitriptyline was increased from 50-->75 mg qhs.  11/07/21 Patient follows up today for medication management.  He is endorsing analgesic and functional benefit with buprenorphine.  His dose was increased from 5 mcg to 7.5 mcg and he is endorsing approximately 40% overall pain relief with the addition of his transdermal patch.  He is not having any side effects of cognitive changes, nausea, sedation, constipation.  He utilizes a laxative when needed.  We discussed increasing his dose to 10 mcg an hour to optimize analgesic response.  We will reevaluate in 10 to 12 weeks.  08/16/2021: [name] follows up today for his second patient visit.  I reviewed his urine toxicology screen with him today which was positive for THC.  I informed the patient that we will need a clean urine toxicology screen before we are able to consider any controlled substances such as tramadol or buprenorphine.  Patient endorsed understanding.  He states that he has been utilizing THC Gummies which he states he will stop.  I informed him that I cannot prescribe him a controlled 2 or controlled 3 with him having an illicit substance in his urine.  Patient endorsed understanding.  We will repeat his urine toxicology screen today and he is instructed to follow-up in 10 days to review.  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  He describes burning and tingling along his face and in his extremities.  He has tried trigger trial in the past with limited response.  He has been seen by neurology and has been managed on various OT convulsants and neuropathic's including gabapentin which she is currently taking at 600 mg 3 times daily to 4 times daily.  He has also tried TCA with amitriptyline and is currently on 50 mg amitriptyline nightly.  He has also tried tizanidine in the past.  He is not a diabetic.  He has done physical therapy in the past.  When I asked him about his positive urine toxicology screen in 2021 for cocaine, he states that that was a lab error.   I informed the patient that his only medication option here would be buprenorphine for chronic pain management.  Pharmacotherapy Assessment  Buprenorphine 15 mcg an hour  Monitoring: [place] PMP: PDMP reviewed during this encounter.         Florina Ou, RN  09/12/2022 11:03 AM  Sign when Signing Visit Nursing Pain Medication Assessment:  Safety precautions to be maintained throughout the outpatient stay will include: orient to surroundings, keep bed in low position, maintain call bell within reach at all times, provide assistance with transfer out of bed and ambulation.  Medication Inspection Compliance: Pill count conducted under aseptic conditions, in front of the patient. Neither the pills nor the bottle was removed from the patient's sight at any time. Once count was completed pills were immediately returned to the patient in their original bottle.  Medication: Buprenorphine (Suboxone) Pill/Patch Count:  1 of 4 pills remain Pill/Patch Appearance: Markings consistent with prescribed medication Bottle Appearance: Standard pharmacy container. Clearly labeled. Filled Date: 4 / 51 / 2024 Last Medication intake:   YesterdaySafety precautions to be maintained throughout the outpatient stay will include: orient to surroundings, keep bed in low position, maintain call bell within reach at all times, provide assistance with transfer out of bed and ambulation.     UDS:  Summary  Date Value Ref Range Status  09/05/2021 Note  Final    Comment:    ==================================================================== Compliance Drug Analysis, Ur ==================================================================== Test                             Result       Flag       Units  Drug Present and Declared for Prescription Verification   Gabapentin                     PRESENT      EXPECTED   Amitriptyline                  PRESENT      EXPECTED   Nortriptyline                  PRESENT      EXPECTED    Nortriptyline is an expected metabolite of amitriptyline.    Chlorpheniramine               PRESENT      EXPECTED   Hydroxyzine  PRESENT      EXPECTED  Drug Present not Declared for Prescription Verification   Carboxy-THC                    15           UNEXPECTED ng/mg creat    Carboxy-THC is a metabolite of tetrahydrocannabinol (THC). Source of    THC is most commonly herbal marijuana or marijuana-based products,    but THC is also present in a scheduled prescription medication.    Trace amounts of THC can be present in hemp and cannabidiol (CBD)    products. This test is not intended to distinguish between delta-9-    tetrahydrocannabinol, the predominant form of THC in most herbal or    marijuana-based products, and delta-8-tetrahydrocannabinol.    Doxylamine                     PRESENT      UNEXPECTED  Drug Absent but Declared for Prescription Verification   Hydrocodone                    Not Detected UNEXPECTED ng/mg creat   Tizanidine                     Not Detected UNEXPECTED    Tizanidine, as indicated in the declared medication list, is not    always detected even when used as  directed.    Salicylate                     Not Detected UNEXPECTED    Aspirin, as indicated in the declared medication list, is not always    detected even when used as directed.  ==================================================================== Test                      Result    Flag   Units      Ref Range   Creatinine              26               mg/dL      >=16 ==================================================================== Declared Medications:  The flagging and interpretation on this report are based on the  following declared medications.  Unexpected results may arise from  inaccuracies in the declared medications.   **Note: The testing scope of this panel includes these medications:   Amitriptyline (Elavil)  Chlorpheniramine  Gabapentin (Neurontin)  Hydrocodone  Hydroxyzine (Vistaril)   **Note: The testing scope of this panel does not include small to  moderate amounts of these reported medications:   Aspirin  Tizanidine (Zanaflex)   **Note: The testing scope of this panel does not include the  following reported medications:   Amlodipine (Norvasc)  Ammonium Hydroxide (Ammonium Lactate)  Azithromycin (Zithromax)  Benzonatate (Tessalon)  Fluoride (Biotene)  Lactic Acid (Ammonium Lactate)  Mouthwash  Multivitamin  Omeprazole (Prilosec)  Rosuvastatin (Crestor) ==================================================================== For clinical consultation, please call [phone]. ====================================================================      ROS  Constitutional: Denies any fever or chills Gastrointestinal: No reported hemesis, hematochezia, vomiting, or acute GI distress Musculoskeletal: Denies any acute onset joint swelling, redness, loss of ROM, or weakness Neurological:  Lower extremity paresthesias, right foot pain  Medication Review  Blood Glucose Monitoring Suppl, Fluticasone-Umeclidin-Vilant, Ipratropium-Albuterol, amLODipine,  amitriptyline, ammonium lactate, antiseptic oral rinse, aspirin EC, buprenorphine, gabapentin, hydrOXYzine, ipratropium-albuterol, (magic mouthwash (nystatin, lidocaine, diphenhydrAMINE, alum & mag hydroxide) suspension),  omeprazole, revefenacin, rosuvastatin, and tiZANidine  History Review  Allergy: [name] is allergic to opana [oxymorphone hcl], elemental sulfur, oxymorphone, oxymorphone, sulfa antibiotics, and sulfa antibiotics. Drug: [name]  reports that he does not currently use drugs. Alcohol:  reports that he does not currently use alcohol after a past usage of about 14.0 standard drinks of alcohol per week. Tobacco:  reports that he quit smoking about 5 months ago. His smoking use included cigarettes. He has a 25.00 pack-year smoking history. He has been exposed to tobacco smoke. He has never used smokeless tobacco. Social: [name]  reports that he quit smoking about 5 months ago. His smoking use included cigarettes. He has a 25.00 pack-year smoking history. He has been exposed to tobacco smoke. He has never used smokeless tobacco. He reports that he does not currently use alcohol after a past usage of about 14.0 standard drinks of alcohol per week. He reports that he does not currently use drugs. Medical:  has a past medical history of Alcohol abuse, Benzodiazepine dependence (HCC), Benzodiazepine withdrawal (HCC), Chronic pain in right foot, COPD (chronic obstructive pulmonary disease) (HCC), Depression (08/19/2017), Hepatitis C, Hypertension, 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HCC), Seizures (HCC) (2010), Sleep apnea, and Syncope. Surgical: [name]  has a past surgical history that includes Colonoscopy with propofol (N/A, 12/16/2016); Colonoscopy with propofol (N/A, 03/14/2017); Myringotomy with tube placement (Left, 04/28/2019); Nasopharyngoscopy (N/A, 04/28/2019); Foot surgery (Right, 03/12/2002); PORTA CATH INSERTION (N/A, 05/20/2019); Lithotripsy; Hemorrhoid surgery; Tonsillectomy;  Colonoscopy with propofol; Myringotomy; Nasopharyngoscopy; Foot surgery; Port a cath Placement; and PORTA CATH REMOVAL (N/A, 11/29/2020). Family: family history includes Alcohol abuse in his paternal aunt; Arthritis in his mother; Cancer in his father; Depression in his maternal grandfather; Hypertension in his mother; Kidney disease in his father.  Laboratory Chemistry Profile   Renal Lab Results  Component Value Date   BUN 10 05/17/2022   CREATININE 1.07 05/17/2022   BCR 10 02/26/2018   GFRAA >60 12/11/2019   GFRNONAA >60 05/17/2022    Hepatic Lab Results  Component Value Date   AST 22 05/17/2022   ALT 17 05/17/2022   ALBUMIN 4.1 05/17/2022   ALKPHOS 74 05/17/2022   LIPASE 37 04/06/2022   AMMONIA <10 05/04/2022    Electrolytes Lab Results  Component Value Date   NA 139 05/17/2022   K 4.7 05/17/2022   CL 100 05/17/2022   CALCIUM 8.9 05/17/2022   MG 2.3 05/04/2022    Bone No results found for: "VD25OH", "VD125OH2TOT", "ZO1096EA5", "WU9811BJ4", "25OHVITD1", "25OHVITD2", "25OHVITD3", "TESTOFREE", "TESTOSTERONE"  Inflammation (CRP: Acute Phase) (ESR: Chronic Phase) Lab Results  Component Value Date   LATICACIDVEN 0.9 05/03/2022         Note: Above Lab results reviewed.  Recent Imaging Review  MR ANGIO NECK W WO CONTRAST CLINICAL DATA:  Initial evaluation for neuro deficit, stroke suspected, slurred speech. History of nasopharyngeal carcinoma.  EXAM: MRI HEAD WITHOUT AND WITH CONTRAST  MRA HEAD WITHOUT CONTRAST  MRA NECK WITHOUT AND WITH CONTRAST  TECHNIQUE: Multiplanar, multi-echo pulse sequences of the brain and surrounding structures were acquired without intravenous contrast. Angiographic images of the Circle of Willis were acquired using MRA technique without intravenous contrast. Angiographic images of the neck were acquired using MRA technique without and with intravenous contrast. Carotid stenosis measurements (when applicable) are obtained utilizing  NASCET criteria, using the distal internal carotid diameter as the denominator.  CONTRAST:  10mL GADAVIST GADOBUTROL 1 MMOL/ML [place] SOLN  COMPARISON:  Prior PET-CT from 05/24/2019.  FINDINGS: MRI HEAD FINDINGS  Brain: Mildly advanced cerebral atrophy for age. Scattered patchy T2/FLAIR hyperintensity involving the periventricular, deep, and subcortical white matter both cerebral hemispheres, nonspecific, but most commonly related to chronic microvascular ischemic disease. Mild patchy involvement of the pons noted. Overall appearance is mild for age.  No evidence for acute or subacute ischemia. Gray-white matter differentiation maintained. No areas of chronic cortical infarction or other insult. No acute or chronic intracranial blood products.  No mass lesion, midline shift or mass effect. No hydrocephalus or extra-axial fluid collection. Pituitary gland and suprasellar region within normal limits.  No abnormal enhancement.  Vascular: Major intracranial vascular flow voids are maintained.  Skull and upper cervical spine: Craniocervical junction within normal limits. Bone marrow signal intensity normal. No focal marrow replacing lesion. No scalp soft tissue abnormality.  Sinuses/Orbits: Globes and orbital soft tissues demonstrate no acute finding. Chronic right maxillary and ethmoidal sinusitis noted. No significant mastoid effusion.  Other: Visualized nasopharynx is unremarkable without visible residual or recurrent neoplasm.  MRA HEAD FINDINGS  Anterior circulation: Both internal carotid arteries widely patent to the termini without stenosis. A1 segments widely patent. Normal anterior communicating artery complex. Both anterior cerebral arteries widely patent to their distal aspects without stenosis. No M1 stenosis or occlusion. Normal MCA bifurcations. Distal MCA branches well perfused and symmetric.  Posterior circulation: Both vertebral arteries patent  without stenosis. Left vertebral artery dominant. Left PICA patent at its origin. Right PICA not well seen. Basilar patent without stenosis. Superior cerebral arteries patent bilaterally. Both PCAs primarily supplied via the basilar. Subtle short-segment mild stenosis noted at the proximal left P2 segment (series 1039, image 7). PCAs otherwise widely patent to their distal aspects without stenosis.  Anatomic variants: Dominant left vertebral artery.  No aneurysm.  MRA NECK FINDINGS  Aortic arch: Visualized aortic arch normal caliber with standard 3 vessel morphology. No stenosis or other abnormality about the origin the great vessels.  Right carotid system: Right common and internal carotid arteries patent without evidence for dissection. No significant atheromatous irregularity or narrowing about the right carotid bulb.  Left carotid system: Left common and internal carotid arteries patent without evidence for dissection. Mild for age atheromatous irregularity about the left carotid bulb, but no hemodynamically significant greater than 50% stenosis.  Vertebral arteries: Both vertebral arteries arise from the subclavian arteries. No proximal subclavian artery stenosis. Left vertebral artery dominant. Suspected moderate to severe stenosis at the origin of the right vertebral artery (series 1100, image 9). Vertebral arteries otherwise patent without stenosis or dissection.  Other: None  IMPRESSION: MRI HEAD IMPRESSION:  1. No acute intracranial abnormality. 2. Mildly advanced cerebral atrophy for age with mild chronic small vessel ischemic disease. 3. No MRI evidence for residual or recurrent nasopharyngeal mass. No metastatic disease.  MRA HEAD IMPRESSION:  Negative intracranial MRA for large vessel occlusion. No hemodynamically significant or correctable stenosis. No aneurysm.  MRA NECK IMPRESSION:  1. Probable moderate to severe stenosis at the origin of the  right vertebral artery. Otherwise wide patency of both vertebral arteries within the neck. Left vertebral artery dominant. 2. Mild for age atheromatous irregularity about the left carotid bulb, but no hemodynamically significant greater than 50% stenosis. 3. Wide patency of the right carotid artery system within the neck.  Electronically Signed   By: Rise Mu M.D.   On: 05/03/2022 23:26 MR BRAIN W WO CONTRAST CLINICAL DATA:  Initial evaluation for neuro deficit, stroke suspected, slurred speech. History of nasopharyngeal carcinoma.  EXAM: MRI HEAD WITHOUT AND WITH  CONTRAST  MRA HEAD WITHOUT CONTRAST  MRA NECK WITHOUT AND WITH CONTRAST  TECHNIQUE: Multiplanar, multi-echo pulse sequences of the brain and surrounding structures were acquired without intravenous contrast. Angiographic images of the Circle of Willis were acquired using MRA technique without intravenous contrast. Angiographic images of the neck were acquired using MRA technique without and with intravenous contrast. Carotid stenosis measurements (when applicable) are obtained utilizing NASCET criteria, using the distal internal carotid diameter as the denominator.  CONTRAST:  10mL GADAVIST GADOBUTROL 1 MMOL/ML [place] SOLN  COMPARISON:  Prior PET-CT from 05/24/2019.  FINDINGS: MRI HEAD FINDINGS  Brain: Mildly advanced cerebral atrophy for age. Scattered patchy T2/FLAIR hyperintensity involving the periventricular, deep, and subcortical white matter both cerebral hemispheres, nonspecific, but most commonly related to chronic microvascular ischemic disease. Mild patchy involvement of the pons noted. Overall appearance is mild for age.  No evidence for acute or subacute ischemia. Gray-white matter differentiation maintained. No areas of chronic cortical infarction or other insult. No acute or chronic intracranial blood products.  No mass lesion, midline shift or mass effect. No hydrocephalus  or extra-axial fluid collection. Pituitary gland and suprasellar region within normal limits.  No abnormal enhancement.  Vascular: Major intracranial vascular flow voids are maintained.  Skull and upper cervical spine: Craniocervical junction within normal limits. Bone marrow signal intensity normal. No focal marrow replacing lesion. No scalp soft tissue abnormality.  Sinuses/Orbits: Globes and orbital soft tissues demonstrate no acute finding. Chronic right maxillary and ethmoidal sinusitis noted. No significant mastoid effusion.  Other: Visualized nasopharynx is unremarkable without visible residual or recurrent neoplasm.  MRA HEAD FINDINGS  Anterior circulation: Both internal carotid arteries widely patent to the termini without stenosis. A1 segments widely patent. Normal anterior communicating artery complex. Both anterior cerebral arteries widely patent to their distal aspects without stenosis. No M1 stenosis or occlusion. Normal MCA bifurcations. Distal MCA branches well perfused and symmetric.  Posterior circulation: Both vertebral arteries patent without stenosis. Left vertebral artery dominant. Left PICA patent at its origin. Right PICA not well seen. Basilar patent without stenosis. Superior cerebral arteries patent bilaterally. Both PCAs primarily supplied via the basilar. Subtle short-segment mild stenosis noted at the proximal left P2 segment (series 1039, image 7). PCAs otherwise widely patent to their distal aspects without stenosis.  Anatomic variants: Dominant left vertebral artery.  No aneurysm.  MRA NECK FINDINGS  Aortic arch: Visualized aortic arch normal caliber with standard 3 vessel morphology. No stenosis or other abnormality about the origin the great vessels.  Right carotid system: Right common and internal carotid arteries patent without evidence for dissection. No significant atheromatous irregularity or narrowing about the right carotid  bulb.  Left carotid system: Left common and internal carotid arteries patent without evidence for dissection. Mild for age atheromatous irregularity about the left carotid bulb, but no hemodynamically significant greater than 50% stenosis.  Vertebral arteries: Both vertebral arteries arise from the subclavian arteries. No proximal subclavian artery stenosis. Left vertebral artery dominant. Suspected moderate to severe stenosis at the origin of the right vertebral artery (series 1100, image 9). Vertebral arteries otherwise patent without stenosis or dissection.  Other: None  IMPRESSION: MRI HEAD IMPRESSION:  1. No acute intracranial abnormality. 2. Mildly advanced cerebral atrophy for age with mild chronic small vessel ischemic disease. 3. No MRI evidence for residual or recurrent nasopharyngeal mass. No metastatic disease.  MRA HEAD IMPRESSION:  Negative intracranial MRA for large vessel occlusion. No hemodynamically significant or correctable stenosis. No aneurysm.  MRA NECK IMPRESSION:  1. 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 Otherwise wide patency of both vertebral arteries within the neck. Left vertebral artery dominant. 2. Mild for age atheromatous irregularity about the left carotid bulb, but no hemodynamically significant greater than 50% stenosis. 3. Wide patency of the right carotid artery system within the neck.  Electronically Signed   By: Rise Mu M.D.   On: 05/03/2022 23:26 MR ANGIO HEAD WO CONTRAST CLINICAL DATA:  Initial evaluation for neuro deficit, stroke suspected, slurred speech. History of nasopharyngeal carcinoma.  EXAM: MRI HEAD WITHOUT AND WITH CONTRAST  MRA HEAD WITHOUT CONTRAST  MRA NECK WITHOUT AND WITH CONTRAST  TECHNIQUE: Multiplanar, multi-echo pulse sequences of the brain and surrounding structures were acquired without intravenous contrast. Angiographic images of the Circle  of Willis were acquired using MRA technique without intravenous contrast. Angiographic images of the neck were acquired using MRA technique without and with intravenous contrast. Carotid stenosis measurements (when applicable) are obtained utilizing NASCET criteria, using the distal internal carotid diameter as the denominator.  CONTRAST:  10mL GADAVIST GADOBUTROL 1 MMOL/ML [place] SOLN  COMPARISON:  Prior PET-CT from 05/24/2019.  FINDINGS: MRI HEAD FINDINGS  Brain: Mildly advanced cerebral atrophy for age. Scattered patchy T2/FLAIR hyperintensity involving the periventricular, deep, and subcortical white matter both cerebral hemispheres, nonspecific, but most commonly related to chronic microvascular ischemic disease. Mild patchy involvement of the pons noted. Overall appearance is mild for age.  No evidence for acute or subacute ischemia. Gray-white matter differentiation maintained. No areas of chronic cortical infarction or other insult. No acute or chronic intracranial blood products.  No mass lesion, midline shift or mass effect. No hydrocephalus or extra-axial fluid collection. Pituitary gland and suprasellar region within normal limits.  No abnormal enhancement.  Vascular: Major intracranial vascular flow voids are maintained.  Skull and upper cervical spine: Craniocervical junction within normal limits. Bone marrow signal intensity normal. No focal marrow replacing lesion. No scalp soft tissue abnormality.  Sinuses/Orbits: Globes and orbital soft tissues demonstrate no acute finding. Chronic right maxillary and ethmoidal sinusitis noted. No significant mastoid effusion.  Other: Visualized nasopharynx is unremarkable without visible residual or recurrent neoplasm.  MRA HEAD FINDINGS  Anterior circulation: Both internal carotid arteries widely patent to the termini without stenosis. A1 segments widely patent. Normal anterior communicating artery complex. Both  anterior cerebral arteries widely patent to their distal aspects without stenosis. No M1 stenosis or occlusion. Normal MCA bifurcations. Distal MCA branches well perfused and symmetric.  Posterior circulation: Both vertebral arteries patent without stenosis. Left vertebral artery dominant. Left PICA patent at its origin. Right PICA not well seen. Basilar patent without stenosis. Superior cerebral arteries patent bilaterally. Both PCAs primarily supplied via the basilar. Subtle short-segment mild stenosis noted at the proximal left P2 segment (series 1039, image 7). PCAs otherwise widely patent to their distal aspects without stenosis.  Anatomic variants: Dominant left vertebral artery.  No aneurysm.  MRA NECK FINDINGS  Aortic arch: Visualized aortic arch normal caliber with standard 3 vessel morphology. No stenosis or other abnormality about the origin the great vessels.  Right carotid system: Right common and internal carotid arteries patent without evidence for dissection. No significant atheromatous irregularity or narrowing about the right carotid bulb.  Left carotid system: Left common and internal carotid arteries patent without evidence for dissection. Mild for age atheromatous irregularity about the left carotid bulb, but no hemodynamically significant greater than 50% stenosis.  Vertebral arteries: Both vertebral arteries arise from the  subclavian arteries. No proximal subclavian artery stenosis. Left vertebral artery dominant. Suspected moderate to severe stenosis at the origin of the right vertebral artery (series 1100, image 9). Vertebral arteries otherwise patent without stenosis or dissection.  Other: None  IMPRESSION: MRI HEAD IMPRESSION:  1. No acute intracranial abnormality. 2. Mildly advanced cerebral atrophy for age with mild chronic small vessel ischemic disease. 3. No MRI evidence for residual or recurrent nasopharyngeal mass. No metastatic  disease.  MRA HEAD IMPRESSION:  Negative intracranial MRA for large vessel occlusion. No hemodynamically significant or correctable stenosis. No aneurysm.  MRA NECK IMPRESSION:  1. 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 Otherwise wide patency of both vertebral arteries within the neck. Left vertebral artery dominant. 2. Mild for age atheromatous irregularity about the left carotid bulb, but no hemodynamically significant greater than 50% stenosis. 3. Wide patency of the right carotid artery system within the neck.  Electronically Signed   By: Rise Mu M.D.   On: 05/03/2022 23:26 DG Chest 1 View CLINICAL DATA:  Aphasia.  EXAM: CHEST  1 VIEW  COMPARISON:  Chest x-ray 05/03/2022.  FINDINGS: The heart size and mediastinal contours are within normal limits. Both lungs are clear. No visible pleural effusions or pneumothorax. No acute osseous abnormality.  IMPRESSION: No evidence of acute cardiopulmonary disease.  Electronically Signed   By: Feliberto Harts M.D.   On: 05/03/2022 19:56 CT Head Wo Contrast CLINICAL DATA:  Slurred speech, aphasia, altered level of consciousness  EXAM: CT HEAD WITHOUT CONTRAST  TECHNIQUE: Contiguous axial images were obtained from the base of the skull through the vertex without intravenous contrast.  RADIATION DOSE REDUCTION: This exam was performed according to the departmental dose-optimization program which includes automated exposure control, adjustment of the mA and/or kV according to patient size and/or use of iterative reconstruction technique.  COMPARISON:  12/10/2019  FINDINGS: Brain: No acute infarct or hemorrhage. Lateral ventricles and midline structures are unremarkable. No acute extra-axial fluid collections. No mass effect.  Vascular: No hyperdense vessel or unexpected calcification.  Skull: Normal. Negative for fracture or focal lesion.  Sinuses/Orbits:  Opacification of the right maxillary sinus. Prominent mucoperiosteal thickening within the right ethmoid air cells.  Other: None.  IMPRESSION: 1. No acute infarct or hemorrhage. 2. Right maxillary and ethmoid sinus disease.  Electronically Signed   By: Sharlet Salina M.D.   On: 05/03/2022 16:07 DG Chest 2 View CLINICAL DATA:  Slurred speech and aphasia  EXAM: CHEST - 2 VIEW  COMPARISON:  04/06/2022  FINDINGS: Cardiac shadow is at the upper limits of normal size. Aortic calcifications are seen. Lungs are clear bilaterally. No bony abnormality is seen.  IMPRESSION: No acute abnormality noted.  Electronically Signed   By: Alcide Clever M.D.   On: 05/03/2022 15:54 Note: Reviewed        Physical Exam  General appearance: Well nourished, well developed, and well hydrated. In no apparent acute distress Mental status: Alert, oriented x 3 (person, place, & time)       Respiratory: No evidence of acute respiratory distress Eyes: PERLA Vitals: BP 133/61   Pulse 76   Temp 99.1 F (37.3 C) (Temporal)   Ht 5\' 9"  (1.753 m)   Wt 220 lb (99.8 kg)   SpO2 97%   BMI 32.49 kg/m  BMI: Estimated body mass index is 32.49 kg/m as calculated from the following:   Height as of this encounter: 5\' 9"  (1.753 m).   Weight as  of this encounter: 220 lb (99.8 kg). Ideal: Ideal body weight: 70.7 kg (155 lb 13.8 oz) Adjusted ideal body weight: 82.3 kg (181 lb 8.3 oz)  Lower Extremity Exam    Side: Right lower extremity  Side: Left lower extremity  Stability: No instability observed          Stability: No instability observed          Skin & Extremity Inspection: Evidence of prior arthroplastic surgery, slight edema, allodynia, slight discoloration  Skin & Extremity Inspection: Skin color, temperature, and hair growth are WNL. No peripheral edema or cyanosis. No masses, redness, swelling, asymmetry, or associated skin lesions. No contractures.  Functional ROM: Pain restricted ROM                   Functional ROM: Unrestricted ROM                  Muscle Tone/Strength: Functionally intact. No obvious neuro-muscular anomalies detected.  Muscle Tone/Strength: Functionally intact. No obvious neuro-muscular anomalies detected.  Sensory (Neurological): Neurogenic pain pattern        Sensory (Neurological): Unimpaired        DTR: Patellar: deferred today Achilles: deferred today Plantar: deferred today  DTR: Patellar: deferred today Achilles: deferred today Plantar: deferred today  Palpation: No palpable anomalies  Palpation: No palpable anomalies    Assessment   Diagnosis Status  1. Complex regional pain syndrome type 2 of right upper extremity   2. Chemotherapy-induced peripheral neuropathy (HCC)   3. Chemotherapy induced neutropenia (HCC)   4. Depression, major, single episode, moderate (HCC)   5. Nasopharyngeal carcinoma (HCC)   6. Chronic pain syndrome   7. 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 I was very clear and direct with [name] about treatment plan, specifically as it pertain to medication management   Requested Prescriptions   Signed Prescriptions Disp Refills   buprenorphine (BUTRANS) 15 MCG/HR 4 patch 2    Sig: Place 1 patch onto the skin once a week.     Orders:  Orders Placed This Encounter  Procedures   LUMBAR SYMPATHETIC BLOCK    For sympathetically-mediated lower extremity pain.    Standing Status:   Future    Standing Expiration Date:   12/13/2022    Scheduling Instructions:     Purpose: Diagnostic     Laterality: RIGHT     Level(s): Lumbar sympathetic chain (L3)     Sedation: Valium PO     Scheduling  Timeframe: As permitted by the schedule    Order Specific Question:   Where will this procedure be performed?    Answer:   [facility]   Follow-up plan:   Return in about 1 month (around 10/14/2022) for Right L3 LSB, in clinic (PO Valium).    Recent Visits No visits were found meeting these conditions. Showing recent visits within past 90 days and meeting all other requirements Today's Visits Date Type Provider Dept  09/12/22 Office Visit Edward Jolly, MD [facility]  Showing today's visits and meeting all other requirements Future Appointments No visits were found meeting these conditions. Showing future appointments within next 90 days  and meeting all other requirements  I discussed the assessment and treatment plan with the patient. The patient was provided an opportunity to ask questions and all were answered. The patient agreed with the plan and demonstrated an understanding of the instructions.  Patient advised to call back or seek an in-person evaluation if the symptoms or condition worsens.  Duration of encounter: .  Note by: Edward Jolly, MD Date: 09/12/2022; Time: 12:00 PM

## 2022-09-12 NOTE — Progress Notes (Signed)
Nursing Pain Medication Assessment:  Safety precautions to be maintained throughout the outpatient stay will include: orient to surroundings, keep bed in low position, maintain call bell within reach at all times, provide assistance with transfer out of bed and ambulation.  Medication Inspection Compliance: Pill count conducted under aseptic conditions, in front of the patient. Neither the pills nor the bottle was removed from the patient's sight at any time. Once count was completed pills were immediately returned to the patient in their original bottle.  Medication: Buprenorphine (Suboxone) Pill/Patch Count:  1 of 4 pills remain Pill/Patch Appearance: Markings consistent with prescribed medication Bottle Appearance: Standard pharmacy container. Clearly labeled. Filled Date: 4 / 60 / 2024 Last Medication intake:  YesterdaySafety precautions to be maintained throughout the outpatient stay will include: orient to surroundings, keep bed in low position, maintain call bell within reach at all times, provide assistance with transfer out of bed and ambulation.

## 2022-09-12 NOTE — Patient Instructions (Signed)
GENERAL RISKS AND COMPLICATIONS ° °What are the risk, side effects and possible complications? °Generally speaking, most procedures are safe.  However, with any procedure there are risks, side effects, and the possibility of complications.  The risks and complications are dependent upon the sites that are lesioned, or the type of nerve block to be performed.  The closer the procedure is to the spine, the more serious the risks are.  Great care is taken when placing the radio frequency needles, block needles or lesioning probes, but sometimes complications can occur. °Infection: Any time there is an injection through the skin, there is a risk of infection.  This is why sterile conditions are used for these blocks.  There are four possible types of infection. °Localized skin infection. °Central Nervous System Infection-This can be in the form of Meningitis, which can be deadly. °Epidural Infections-This can be in the form of an epidural abscess, which can cause pressure inside of the spine, causing compression of the spinal cord with subsequent paralysis. This would require an emergency surgery to decompress, and there are no guarantees that the patient would recover from the paralysis. °Discitis-This is an infection of the intervertebral discs.  It occurs in about 1% of discography procedures.  It is difficult to treat and it may lead to surgery. ° °      2. Pain: the needles have to go through skin and soft tissues, will cause soreness. °      3. Damage to internal structures:  The nerves to be lesioned may be near blood vessels or   ° other nerves which can be potentially damaged. °      4. Bleeding: Bleeding is more common if the patient is taking blood thinners such as  aspirin, Coumadin, Ticiid, Plavix, etc., or if he/she have some genetic predisposition  such as hemophilia. Bleeding into the spinal canal can cause compression of the spinal  cord with subsequent paralysis.  This would require an emergency  surgery to  decompress and there are no guarantees that the patient would recover from the  paralysis. °      5. Pneumothorax:  Puncturing of a lung is a possibility, every time a needle is introduced in  the area of the chest or upper back.  Pneumothorax refers to free air around the  collapsed lung(s), inside of the thoracic cavity (chest cavity).  Another two possible  complications related to a similar event would include: Hemothorax and Chylothorax.   These are variations of the Pneumothorax, where instead of air around the collapsed  lung(s), you may have blood or chyle, respectively. °      6. Spinal headaches: They may occur with any procedures in the area of the spine. °      7. Persistent CSF (Cerebro-Spinal Fluid) leakage: This is a rare problem, but may occur  with prolonged intrathecal or epidural catheters either due to the formation of a fistulous  track or a dural tear. °      8. Nerve damage: By working so close to the spinal cord, there is always a possibility of  nerve damage, which could be as serious as a permanent spinal cord injury with  paralysis. °      9. Death:  Although rare, severe deadly allergic reactions known as "Anaphylactic  reaction" can occur to any of the medications used. °     10. Worsening of the symptoms:  We can always make thing worse. ° °What are the chances   of something like this happening? Chances of any of this occuring are extremely low.  By statistics, you have more of a chance of getting killed in a motor vehicle accident: while driving to the hospital than any of the above occurring .  Nevertheless, you should be aware that they are possibilities.  In general, it is similar to taking a shower.  Everybody knows that you can slip, hit your head and get killed.  Does that mean that you should not shower again?  Nevertheless always keep in mind that statistics do not mean anything if you happen to be on the wrong side of them.  Even if a procedure has a 1 (one) in a  1,000,000 (million) chance of going wrong, it you happen to be that one..Also, keep in mind that by statistics, you have more of a chance of having something go wrong when taking medications.  Who should not have this procedure? If you are on a blood thinning medication (e.g. Coumadin, Plavix, see list of "Blood Thinners"), or if you have an active infection going on, you should not have the procedure.  If you are taking any blood thinners, please inform your physician.  How should I prepare for this procedure? Do not eat or drink anything at least six hours prior to the procedure. Bring a driver with you .  It cannot be a taxi. Come accompanied by an adult that can drive you back, and that is strong enough to help you if your legs get weak or numb from the local anesthetic. Take all of your medicines the morning of the procedure with just enough water to swallow them. If you have diabetes, make sure that you are scheduled to have your procedure done first thing in the morning, whenever possible. If you have diabetes, take only half of your insulin dose and notify our nurse that you have done so as soon as you arrive at the clinic. If you are diabetic, but only take blood sugar pills (oral hypoglycemic), then do not take them on the morning of your procedure.  You may take them after you have had the procedure. Do not take aspirin or any aspirin-containing medications, at least eleven (11) days prior to the procedure.  They may prolong bleeding. Wear loose fitting clothing that may be easy to take off and that you would not mind if it got stained with Betadine or blood. Do not wear any jewelry or perfume Remove any nail coloring.  It will interfere with some of our monitoring equipment.  NOTE: Remember that this is not meant to be interpreted as a complete list of all possible complications.  Unforeseen problems may occur.  BLOOD THINNERS The following drugs contain aspirin or other products,  which can cause increased bleeding during surgery and should not be taken for 2 weeks prior to and 1 week after surgery.  If you should need take something for relief of minor pain, you may take acetaminophen which is found in Tylenol,m Datril, Anacin-3 and Panadol. It is not blood thinner. The products listed below are.  Do not take any of the products listed below in addition to any listed on your instruction sheet.  A.P.C or A.P.C with Codeine Codeine Phosphate Capsules #3 Ibuprofen Ridaura  ABC compound Congesprin Imuran rimadil  Advil Cope Indocin Robaxisal  Alka-Seltzer Effervescent Pain Reliever and Antacid Coricidin or Coricidin-D  Indomethacin Rufen  Alka-Seltzer plus Cold Medicine Cosprin Ketoprofen S-A-C Tablets  Anacin Analgesic Tablets or Capsules Coumadin   Korlgesic Salflex  Anacin Extra Strength Analgesic tablets or capsules CP-2 Tablets Lanoril Salicylate  Anaprox Cuprimine Capsules Levenox Salocol  Anexsia-D Dalteparin Magan Salsalate  Anodynos Darvon compound Magnesium Salicylate Sine-off  Ansaid Dasin Capsules Magsal Sodium Salicylate  Anturane Depen Capsules Marnal Soma  APF Arthritis pain formula Dewitt's Pills Measurin Stanback  Argesic Dia-Gesic Meclofenamic Sulfinpyrazone  Arthritis Bayer Timed Release Aspirin Diclofenac Meclomen Sulindac  Arthritis pain formula Anacin Dicumarol Medipren Supac  Analgesic (Safety coated) Arthralgen Diffunasal Mefanamic Suprofen  Arthritis Strength Bufferin Dihydrocodeine Mepro Compound Suprol  Arthropan liquid Dopirydamole Methcarbomol with Aspirin Synalgos  ASA tablets/Enseals Disalcid Micrainin Tagament  Ascriptin Doan's Midol Talwin  Ascriptin A/D Dolene Mobidin Tanderil  Ascriptin Extra Strength Dolobid Moblgesic Ticlid  Ascriptin with Codeine Doloprin or Doloprin with Codeine Momentum Tolectin  Asperbuf Duoprin Mono-gesic Trendar  Aspergum Duradyne Motrin or Motrin IB Triminicin  Aspirin plain, buffered or enteric coated  Durasal Myochrisine Trigesic  Aspirin Suppositories Easprin Nalfon Trillsate  Aspirin with Codeine Ecotrin Regular or Extra Strength Naprosyn Uracel  Atromid-S Efficin Naproxen Ursinus  Auranofin Capsules Elmiron Neocylate Vanquish  Axotal Emagrin Norgesic Verin  Azathioprine Empirin or Empirin with Codeine Normiflo Vitamin E  Azolid Emprazil Nuprin Voltaren  Bayer Aspirin plain, buffered or children's or timed BC Tablets or powders Encaprin Orgaran Warfarin Sodium  Buff-a-Comp Enoxaparin Orudis Zorpin  Buff-a-Comp with Codeine Equegesic Os-Cal-Gesic   Buffaprin Excedrin plain, buffered or Extra Strength Oxalid   Bufferin Arthritis Strength Feldene Oxphenbutazone   Bufferin plain or Extra Strength Feldene Capsules Oxycodone with Aspirin   Bufferin with Codeine Fenoprofen Fenoprofen Pabalate or Pabalate-SF   Buffets II Flogesic Panagesic   Buffinol plain or Extra Strength Florinal or Florinal with Codeine Panwarfarin   Buf-Tabs Flurbiprofen Penicillamine   Butalbital Compound Four-way cold tablets Penicillin   Butazolidin Fragmin Pepto-Bismol   Carbenicillin Geminisyn Percodan   Carna Arthritis Reliever Geopen Persantine   Carprofen Gold's salt Persistin   Chloramphenicol Goody's Phenylbutazone   Chloromycetin Haltrain Piroxlcam   Clmetidine heparin Plaquenil   Cllnoril Hyco-pap Ponstel   Clofibrate Hydroxy chloroquine Propoxyphen         Before stopping any of these medications, be sure to consult the physician who ordered them.  Some, such as Coumadin (Warfarin) are ordered to prevent or treat serious conditions such as "deep thrombosis", "pumonary embolisms", and other heart problems.  The amount of time that you may need off of the medication may also vary with the medication and the reason for which you were taking it.  If you are taking any of these medications, please make sure you notify your pain physician before you undergo any procedures.         Sympathetic Nerve  Block A sympathetic nerve block is a procedure to help diagnose or relieve pain. The sympathetic nerves run along the spine and control certain processes in the body that happen without thinking about them, such as sweating. A sympathetic nerve block is done to numb these nerves. The procedure may be used to find out if a person's pain is being caused by damaged sympathetic nerves. It can also be used to relieve pain that is caused by nerve damage. It is typically done to treat long-term (chronic) pain. Pain that is caused by damaged sympathetic nerves will go away for a period of time after the block. The pain relief usually ends when the medicine wears off, but sometimes it continues for longer. Tell a health care provider about: Any allergies you have. All medicines you  are taking, including vitamins, herbs, eye drops, creams, and over-the-counter medicines. Any problems you or family members have had with anesthetic medicines. Any bleeding problems you have. Any surgeries you have had. Any medical conditions you have. Whether you are pregnant or may be pregnant. What are the risks? Your health care provider will talk with you about risks. These may include: Failure of the nerve block to relieve your pain. Feeling worse pain than you did before. Allergic reactions to medicines. Infection. Blood vessel damage. Damage to nearby structures or organs, such as temporary or permanent nerve damage. What happens before the procedure? Eating and drinking Follow instructions from your health care provider about what you may eat and drink. Medicines Ask your health care provider about: Changing or stopping your regular medicines. These include any diabetes medicines or blood thinners you take. Taking medicines such as aspirin and ibuprofen. These medicines can thin your blood. Do not take them unless your health care provider tells you to. Taking over-the-counter medicines, vitamins, herbs, and  supplements. General instructions If you will be going home right after the procedure, plan to have a responsible adult: Take you home from the hospital or clinic. You will not be allowed to drive. Care for you for the time you are told. What happens during the procedure?  An IV will be inserted into one of your veins. You may be given: A sedative. This helps you relax. Anesthesia. This will numb certain areas of your body. A needle will be inserted into the numbed area. Your health care provider will use an imaging tool, such as an X-ray or a CT scan, to help put the needle in the right place. Your health care provider will inject a numbing medicine (anesthetic) into the nerves. The injection site will depend on where you have pain. If the pain is in your lower body, the medicine may be injected into nerves in your lower back. If the pain is in your upper body, the medicine may be injected into nerves in your neck. If the pain is in your abdomen, the medicine may be injected into nerves in the middle of your back. The needle will be removed. A small bandage (dressing) may be placed over the area where the needle was inserted. The procedure may vary among health care providers and hospitals. What happens after the procedure? Your blood pressure, heart rate, breathing rate, and blood oxygen level will be monitored until you leave the hospital or clinic. This information is not intended to replace advice given to you by your health care provider. Make sure you discuss any questions you have with your health care provider. Document Revised: 09/05/2021 Document Reviewed: 09/05/2021 Elsevier Patient Education  2023 [employer].

## 2022-09-17 DIAGNOSIS — J449 Chronic obstructive pulmonary disease, unspecified: Secondary | ICD-10-CM | POA: Diagnosis not present

## 2022-09-19 ENCOUNTER — Telehealth: Payer: Self-pay | Admitting: Physician Assistant

## 2022-09-19 ENCOUNTER — Encounter: Payer: Self-pay | Admitting: Physician Assistant

## 2022-09-19 ENCOUNTER — Ambulatory Visit (INDEPENDENT_AMBULATORY_CARE_PROVIDER_SITE_OTHER): Payer: Medicare HMO | Admitting: Physician Assistant

## 2022-09-19 VITALS — BP 110/47 | HR 52 | Ht 69.0 in | Wt 226.0 lb

## 2022-09-19 DIAGNOSIS — M5442 Lumbago with sciatica, left side: Secondary | ICD-10-CM

## 2022-09-19 DIAGNOSIS — M5441 Lumbago with sciatica, right side: Secondary | ICD-10-CM | POA: Diagnosis not present

## 2022-09-19 NOTE — Progress Notes (Signed)
Established patient visit   Patient: [name]   DOB: 02/13/1951   72 y.o. Male  MRN: [id number] Visit Date: 09/19/2022  Today's healthcare provider: Alfredia Ferguson, PA-C   Cc. Low back pain   Subjective    HPI  Pt reports a flare of his chronic low back pain. Reports he is working with pain management but is in pain today. Looking for additional relief.   Medications: Outpatient Medications Prior to Visit  Medication Sig   amitriptyline (ELAVIL) 75 MG tablet Take one tab po qhs for insomnia (Patient taking differently: Take 75 mg by mouth at bedtime. Take one tab po qhs for insomnia)   amLODipine (NORVASC) 5 MG tablet TAKE 1 TABLET BY MOUTH DAILY   ammonium lactate (AMLACTIN) 12 % lotion Apply 1 application. topically as needed for dry skin.   antiseptic oral rinse (BIOTENE) LIQD 15 mLs by Mouth Rinse route as needed for dry mouth.   aspirin EC 81 MG tablet Take 81 mg by mouth daily. Swallow whole.   Blood Glucose Monitoring Suppl DEVI 1 each by Does not apply route in the morning, at noon, and at bedtime. May substitute to any manufacturer covered by patient's insurance.   [START ON 09/24/2022] buprenorphine (BUTRANS) 15 MCG/HR Place 1 patch onto the skin once a week.   Fluticasone-Umeclidin-Vilant (TRELEGY ELLIPTA) 100-62.5-25 MCG/ACT AEPB Inhale 1 puff into the lungs daily.   gabapentin (NEURONTIN) 600 MG tablet TAKE 1 TABLET BY MOUTH EVERY MORNING, 1 TABLET AT NOON, 1 TABLET IN THE EVENING AND 1 TABLET AT BEDTIME AS DIRECTED. *NOTE DOSEAGE CHANGE*   hydrOXYzine (VISTARIL) 25 MG capsule TAKE 1 CAPSULE BY MOUTH EVERY day as needed for anxiety   Ipratropium-Albuterol (COMBIVENT RESPIMAT) 20-100 MCG/ACT AERS respimat Inhale 1 puff into the lungs every 6 (six) hours as needed. Rescue inhaler   ipratropium-albuterol (DUONEB) 0.5-2.5 (3) MG/3ML SOLN Take 3 mLs by nebulization every 4 (four) hours as needed.   magic mouthwash (nystatin, lidocaine, diphenhydrAMINE, alum  & mag hydroxide) suspension Swish and spit 5 mLs 3 (three) times daily as needed for mouth pain.   omeprazole (PRILOSEC) 40 MG capsule TAKE 1 CAPSULE BY MOUTH DAILY   revefenacin (YUPELRI) 175 MCG/3ML nebulizer solution Take 3 mLs (175 mcg total) by nebulization daily.   rosuvastatin (CRESTOR) 5 MG tablet TAKE 1 TABLET BY MOUTH DAILY. (Patient taking differently: Take 5 mg by mouth daily.)   tiZANidine (ZANAFLEX) 4 MG tablet Take 1 tablet (4 mg total) by mouth every 6 (six) hours as needed for muscle spasms.   Facility-Administered Medications Prior to Visit  Medication Dose Route Frequency Provider   heparin lock flush 100 unit/mL  500 Units Intravenous Once Creig Hines, MD    Review of Systems  Constitutional:  Negative for fatigue and fever.  Respiratory:  Negative for cough and shortness of breath.   Cardiovascular:  Negative for chest pain, palpitations and leg swelling.  Musculoskeletal:  Positive for back pain.  Neurological:  Negative for dizziness and headaches.      Objective    BP (!) 110/47   Pulse (!) 52   Ht 5\' 9"  (1.753 m)   Wt 226 lb (102.5 kg)   SpO2 96%   BMI 33.37 kg/m    Physical Exam Vitals reviewed.  Constitutional:      Appearance: He is not ill-appearing.  HENT:     Head: Normocephalic.  Eyes:     Conjunctiva/sclera: Conjunctivae normal.  Cardiovascular:  Rate and Rhythm: Normal rate.  Pulmonary:     Effort: Pulmonary effort is normal. No respiratory distress.  Neurological:     General: No focal deficit present.     Mental Status: He is alert and oriented to person, place, and time.  Psychiatric:        Mood and Affect: Mood normal.        Behavior: Behavior normal.      No results found for any visits on 09/19/22.  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  Reviewed last note; plan as  below Analgesic and functional benefit with Butran 15 mcg an hour, continue  RIGHT lumbar sympathetic nerve block for CRPS of right foot Consider lidocaine infusion for neuropathic pain Consider spinal cord stimulation (dorsal column SCS trial) for lower extremity neuropathic pain.  Pt states he was unaware of additional pain control options and is in pain today, looking for something additional to help. Advised I would discuss with Dr Cherylann Ratel as pt is already on chronic pain management meds        Return if symptoms worsen or fail to improve.      I, Alfredia Ferguson, PA-C have reviewed all documentation for this visit. The documentation on  09/19/22   for the exam, diagnosis, procedures, and orders are all accurate and complete.  Alfredia Ferguson, PA-C [facility] [address] [place], [place], [address] Office: [phone] Fax: [phone]   [facility]

## 2022-09-19 NOTE — Assessment & Plan Note (Signed)
Pt follows with pain management.  Reviewed last note; plan as below Analgesic and functional benefit with Butran 15 mcg an hour, continue  RIGHT lumbar sympathetic nerve block for CRPS of right foot Consider lidocaine infusion for neuropathic pain Consider spinal cord stimulation (dorsal column SCS trial) for lower extremity neuropathic pain.  Pt states he was unaware of additional pain control options and is in pain today, looking for something additional to help. Advised I would discuss with Dr Cherylann Ratel as pt is already on chronic pain management meds

## 2022-09-19 NOTE — Telephone Encounter (Signed)
Copied from CRM [id number]. Topic: Medicare AWV >> Sep 19, 2022  3:10 PM [name] wrote: Reason for CRM: Called patient to schedule Medicare Annual Wellness Visit (AWV). Left message for patient to call back and schedule Medicare Annual Wellness Visit (AWV).  Last date of AWV: 07/31/2021  Please schedule an AWVS appointment at any time with [facility] ANNUAL WELLNESS VISIT.  If any questions, please contact me at [phone].    Thank you,  [facility] Support [facility] Direct dial  [phone]

## 2022-09-25 ENCOUNTER — Emergency Department: Payer: Medicare HMO

## 2022-09-25 ENCOUNTER — Emergency Department
Admission: EM | Admit: 2022-09-25 | Discharge: 2022-09-25 | Disposition: A | Discharge status: Home / Self Care | Payer: Medicare HMO | Attending: Emergency Medicine | Admitting: Emergency Medicine

## 2022-09-25 DIAGNOSIS — X31XXXA Exposure to excessive natural cold, initial encounter: Secondary | ICD-10-CM | POA: Insufficient documentation

## 2022-09-25 DIAGNOSIS — R0689 Other abnormalities of breathing: Secondary | ICD-10-CM | POA: Diagnosis not present

## 2022-09-25 DIAGNOSIS — M25552 Pain in left hip: Secondary | ICD-10-CM | POA: Diagnosis not present

## 2022-09-25 DIAGNOSIS — E119 Type 2 diabetes mellitus without complications: Secondary | ICD-10-CM | POA: Insufficient documentation

## 2022-09-25 DIAGNOSIS — Z743 Need for continuous supervision: Secondary | ICD-10-CM | POA: Diagnosis not present

## 2022-09-25 DIAGNOSIS — R4182 Altered mental status, unspecified: Secondary | ICD-10-CM

## 2022-09-25 DIAGNOSIS — I10 Essential (primary) hypertension: Secondary | ICD-10-CM | POA: Diagnosis not present

## 2022-09-25 DIAGNOSIS — R55 Syncope and collapse: Secondary | ICD-10-CM | POA: Diagnosis not present

## 2022-09-25 DIAGNOSIS — T68XXXA Hypothermia, initial encounter: Secondary | ICD-10-CM | POA: Insufficient documentation

## 2022-09-25 DIAGNOSIS — R404 Transient alteration of awareness: Secondary | ICD-10-CM | POA: Diagnosis not present

## 2022-09-25 DIAGNOSIS — R918 Other nonspecific abnormal finding of lung field: Secondary | ICD-10-CM | POA: Diagnosis not present

## 2022-09-25 DIAGNOSIS — J449 Chronic obstructive pulmonary disease, unspecified: Secondary | ICD-10-CM | POA: Diagnosis not present

## 2022-09-25 LAB — URINE DRUG SCREEN, QUALITATIVE (ARMC ONLY)
Amphetamines, Ur Screen: NOT DETECTED
Barbiturates, Ur Screen: NOT DETECTED
Benzodiazepine, Ur Scrn: POSITIVE — AB
Cannabinoid 50 Ng, Ur ~~LOC~~: NOT DETECTED
Cocaine Metabolite,Ur ~~LOC~~: NOT DETECTED
MDMA (Ecstasy)Ur Screen: NOT DETECTED
Methadone Scn, Ur: NOT DETECTED
Opiate, Ur Screen: NOT DETECTED
Phencyclidine (PCP) Ur S: NOT DETECTED
Tricyclic, Ur Screen: POSITIVE — AB

## 2022-09-25 LAB — COMPREHENSIVE METABOLIC PANEL
ALT: 17 U/L (ref 0–44)
AST: 31 U/L (ref 15–41)
Albumin: 4.5 g/dL (ref 3.5–5.0)
Alkaline Phosphatase: 74 U/L (ref 38–126)
Anion gap: 11 (ref 5–15)
BUN: 11 mg/dL (ref 8–23)
CO2: 27 mmol/L (ref 22–32)
Calcium: 9.3 mg/dL (ref 8.9–10.3)
Chloride: 99 mmol/L (ref 98–111)
Creatinine, Ser: 1.2 mg/dL (ref 0.61–1.24)
GFR, Estimated: >60 mL/min (ref >60)
Glucose, Bld: 153 mg/dL — ABNORMAL HIGH (ref 70–99)
Potassium: 4.1 mmol/L (ref 3.5–5.1)
Sodium: 137 mmol/L (ref 135–145)
Total Bilirubin: 0.7 mg/dL (ref 0.3–1.2)
Total Protein: 8.2 g/dL — ABNORMAL HIGH (ref 6.5–8.1)

## 2022-09-25 LAB — CBC WITH DIFFERENTIAL/PLATELET
Abs Immature Granulocytes: 0.02 10*3/uL (ref 0.00–0.07)
Basophils Absolute: 0 10*3/uL (ref 0.0–0.1)
Basophils Relative: 0 %
Eosinophils Absolute: 0 10*3/uL (ref 0.0–0.5)
Eosinophils Relative: 1 %
HCT: 43.8 % (ref 39.0–52.0)
Hemoglobin: 14.7 g/dL (ref 13.0–17.0)
Immature Granulocytes: 0 %
Lymphocytes Relative: 9 %
Lymphs Abs: 0.7 10*3/uL (ref 0.7–4.0)
MCH: 30.7 pg (ref 26.0–34.0)
MCHC: 33.6 g/dL (ref 30.0–36.0)
MCV: 91.4 fL (ref 80.0–100.0)
Monocytes Absolute: 0.4 10*3/uL (ref 0.1–1.0)
Monocytes Relative: 6 %
Neutro Abs: 6.2 10*3/uL (ref 1.7–7.7)
Neutrophils Relative %: 84 %
Platelets: 129 10*3/uL — ABNORMAL LOW (ref 150–400)
RBC: 4.79 MIL/uL (ref 4.22–5.81)
RDW: 14.5 % (ref 11.5–15.5)
WBC: 7.3 10*3/uL (ref 4.0–10.5)
nRBC: 0 % (ref 0.0–0.2)

## 2022-09-25 LAB — BLOOD GAS, VENOUS
Acid-Base Excess: 4 mmol/L — ABNORMAL HIGH (ref 0.0–2.0)
Bicarbonate: 31.9 mmol/L — ABNORMAL HIGH (ref 20.0–28.0)
O2 Saturation: 59 %
Patient temperature: 37
pCO2, Ven: 62 mmHg — ABNORMAL HIGH (ref 44–60)
pH, Ven: 7.32 (ref 7.25–7.43)
pO2, Ven: 37 mmHg (ref 32–45)

## 2022-09-25 LAB — URINALYSIS, ROUTINE W REFLEX MICROSCOPIC
Bilirubin Urine: NEGATIVE
Glucose, UA: NEGATIVE mg/dL
Hgb urine dipstick: NEGATIVE
Ketones, ur: NEGATIVE mg/dL
Leukocytes,Ua: NEGATIVE
Nitrite: NEGATIVE
Protein, ur: NEGATIVE mg/dL
Specific Gravity, Urine: 1.008 (ref 1.005–1.030)
pH: 5 (ref 5.0–8.0)

## 2022-09-25 LAB — ETHANOL: Alcohol, Ethyl (B): <10 mg/dL (ref <10)

## 2022-09-25 LAB — LACTIC ACID, PLASMA: Lactic Acid, Venous: 1.7 mmol/L (ref 0.5–1.9)

## 2022-09-25 LAB — PROTIME-INR
INR: 1 (ref 0.8–1.2)
Prothrombin Time: 13.7 seconds (ref 11.4–15.2)

## 2022-09-25 LAB — ACETAMINOPHEN LEVEL: Acetaminophen (Tylenol), Serum: <10 ug/mL — ABNORMAL LOW (ref 10–30)

## 2022-09-25 LAB — T4, FREE: Free T4: 0.63 ng/dL (ref 0.61–1.12)

## 2022-09-25 LAB — SALICYLATE LEVEL: Salicylate Lvl: <7 mg/dL — ABNORMAL LOW (ref 7.0–30.0)

## 2022-09-25 LAB — PROCALCITONIN: Procalcitonin: <0.1 ng/mL

## 2022-09-25 LAB — TSH: TSH: 6.851 u[IU]/mL — ABNORMAL HIGH (ref 0.350–4.500)

## 2022-09-25 LAB — MAGNESIUM: Magnesium: 1.9 mg/dL (ref 1.7–2.4)

## 2022-09-25 MED ORDER — SODIUM CHLORIDE 0.9 % IV BOLUS
1000.0000 mL | Freq: Once | INTRAVENOUS | Status: AC
  Administered 2022-09-25: 1000 mL via INTRAVENOUS

## 2022-09-25 MED ORDER — LACTATED RINGERS IV BOLUS: 1000.0000 mL | Freq: Once | INTRAVENOUS | Status: DC

## 2022-09-25 NOTE — ED Notes (Signed)
RN called to advise pt sister of pending DC back to [facility].

## 2022-09-25 NOTE — ED Notes (Signed)
This RN made an unsuccessful attempt to contact patient sister to advise of patient discharge and need for transportation back to [facility].

## 2022-09-25 NOTE — ED Notes (Signed)
Assumed care from Alex,RN. Pt resting comfortably in bed at this time. Pt on monitoring. Pt has warm fluids being administered at this time.

## 2022-09-25 NOTE — ED Triage Notes (Signed)
Patient was found lying outside of [facility] unresponsive and brought in by EMS. Patient has known history of polysbubstance abuse.

## 2022-09-25 NOTE — ED Provider Notes (Signed)
[facility] Provider Note    Event Date/Time   First MD Initiated Contact with Patient 09/25/22 [phone]     (approximate)   History   Altered Mental Status   HPI  [name] is a 72 y.o. male   Past medical history of etoh use, benzo use, copd, t2dm, htn, hep c, seizures, here with AMS; found by medics outside somnolent and cold (rained last night) normal pupils no narcan given.   Pt somnolent and unable to provide history.   Hypothermic on arrival put on warm fluids and bairhugger warming blanket.   External Medical Documents Reviewed: Pain medicine note from 09/12/2022 denoting medication management and reviewed hospitalization from December 2023 for aphasia w neg stroke wu      Physical Exam   Triage Vital Signs: ED Triage Vitals  Enc Vitals Group     BP 09/25/22 0630 (!) 181/107     Pulse Rate 09/25/22 0630 68     Resp 09/25/22 0630 16     Temp 09/25/22 0633 (!) 94.4 F (34.7 C)     Temp Source 09/25/22 0633 Rectal     SpO2 09/25/22 0630 97 %     Weight 09/25/22 0720 225 lb 15.5 oz (102.5 kg)     Height 09/25/22 0720 5\' 9"  (1.753 m)     Head Circumference --      Peak Flow --      Pain Score --      Pain Loc --      Pain Edu? --      Excl. in GC? --     Most recent vital signs: Vitals:   09/25/22 1330 09/25/22 1500  BP: 112/86   Pulse: 77 81  Resp: 16 18  Temp: 98.1 F (36.7 C) 98.8 F (37.1 C)  SpO2: 94% 92%    General: Awake, no distress.  CV:  Good peripheral perfusion.  Resp:  Normal effort.  Abd:  No distention.  Other:  Somnolent will moan and withdraw to painful trap squeeze. No obvious trauma to head, torso, abd or extremities. Soft abd. Buprenoprhine path on LUE. Pupils midrange. Lungs clear. Temp 93.7   ED Results / Procedures / Treatments   Labs (all labs ordered are listed, but only abnormal results are displayed) Labs Reviewed  COMPREHENSIVE METABOLIC PANEL - Abnormal; Notable for the following  components:      Result Value   Glucose, Bld 153 (*)    Total Protein 8.2 (*)    All other components within normal limits  CBC WITH DIFFERENTIAL/PLATELET - Abnormal; Notable for the following components:   Platelets 129 (*)    All other components within normal limits  URINALYSIS, ROUTINE W REFLEX MICROSCOPIC - Abnormal; Notable for the following components:   Color, Urine YELLOW (*)    APPearance CLEAR (*)    All other components within normal limits  URINE DRUG SCREEN, QUALITATIVE ([facility] ONLY) - Abnormal; Notable for the following components:   Tricyclic, Ur Screen POSITIVE (*)    Benzodiazepine, Ur Scrn POSITIVE (*)    All other components within normal limits  SALICYLATE LEVEL - Abnormal; Notable for the following components:   Salicylate Lvl <7.0 (*)    All other components within normal limits  ACETAMINOPHEN LEVEL - Abnormal; Notable for the following components:   Acetaminophen (Tylenol), Serum <10 (*)    All other components within normal limits  TSH - Abnormal; Notable for the following components:   TSH 6.851 (*)  All other components within normal limits  BLOOD GAS, VENOUS - Abnormal; Notable for the following components:   pCO2, Ven 62 (*)    Bicarbonate 31.9 (*)    Acid-Base Excess 4.0 (*)    All other components within normal limits  CULTURE, BLOOD (ROUTINE X 2)  CULTURE, BLOOD (ROUTINE X 2)  MAGNESIUM  ETHANOL  PROCALCITONIN  PROTIME-INR  LACTIC ACID, PLASMA  T4, FREE     I ordered and reviewed the above labs they are notable for nl electrolytes  EKG  ED ECG REPORT I, Pilar Jarvis, the attending physician, personally viewed and interpreted this ECG.   Date: 09/25/2022  EKG Time: 0647  Rate: 70  Rhythm: sinus  Intervals:rbbb/lafb  ST&T Change: no stemi    RADIOLOGY I independently reviewed and interpreted cxr and see no obvious focality or pneumothorax   PROCEDURES:  Critical Care performed: Yes, see critical care procedure  note(s)  .Critical Care  Performed by: Pilar Jarvis, MD Authorized by: Pilar Jarvis, MD   Critical care provider statement:    Critical care time (minutes):  30   Critical care was time spent personally by me on the following activities:  Development of treatment plan with patient or surrogate, discussions with consultants, evaluation of patient's response to treatment, examination of patient, ordering and review of laboratory studies, ordering and review of radiographic studies, ordering and performing treatments and interventions, pulse oximetry, re-evaluation of patient's condition and review of old charts    MEDICATIONS ORDERED IN ED: Medications  sodium chloride 0.9 % bolus 1,000 mL (0 mLs Intravenous Stopped 09/25/22 1121)     IMPRESSION / MDM / ASSESSMENT AND PLAN / ED COURSE  I reviewed the triage vital signs and the nursing notes.                                Patient's presentation is most consistent with acute presentation with potential threat to life or bodily function.  Differential diagnosis includes, but is not limited to, intoxication, seizure, infection, sepsis, acs, dysrhythmia, ich, stroke   The patient is on the cardiac monitor to evaluate for evidence of arrhythmia and/or significant heart rate changes.  MDM:  Hx of substance use found down outside minimally responsive w broad ddx as above  Hypothermia- on bairhugger  Sepsis evaluation CT head r/o ICH, tox labs and monitoring.  Removed Buprenorphine patch - tox labs show benzos, monitor on cardiopulonary monitoring.    -- Workup thus far unremarkable.  Ethanol level undetectable and he does have tricyclics and benzodiazepines in his urinalysis.  Imaging has been negative.  He is more wakeful now.  Able to open his eyes and nod yes and no follow commands move all extremities lightly remains on Bair hugger, will continue to observe for improvement.   ---  Progressively improving throughout the entirety  of my shift with frequent rechecks now awake alert comfortable does not remember what happened last night denies any drug use.  Denies any pain or discomfort and denies any recent illnesses.  He is sitting up in bed having full conversations with me.  He has some slurred speech but per his sister who was here earlier this is his baseline.  He is drinking water and asking to stand up to walk and go home.  His temperature is normalized and his hemodynamics remain appropriate reassuring now.  His workup thus far has been unremarkable.  Will take out his  Foley try to ambulate him and if he does well he will be discharged.        FINAL CLINICAL IMPRESSION(S) / ED DIAGNOSES   Final diagnoses:  Altered mental status, unspecified altered mental status type  Hypothermia, initial encounter     Rx / DC Orders   ED Discharge Orders     None        Note:  This document was prepared using Dragon voice recognition software and may include unintentional dictation errors.    Pilar Jarvis, MD 09/25/22 [phone]

## 2022-09-25 NOTE — ED Notes (Signed)
RN attempted to contact pt sister again in regards to transport back to home. No answer and unable to leave VM at this time.

## 2022-09-25 NOTE — ED Provider Notes (Signed)
72 year old male here with likely overdose.  Patient has a history of polysubstance abuse and reportedly was found altered.  He was hypothermic and had been outside for an unknown amount of time.  He has been increasingly awake and alert here.  He has a history of similar presentations in the setting of polysubstance use.  His workup has been unremarkable.  Foley catheter taken out and he is ambulating without difficulty.  Will discharge home.  No apparent emergent pathology.   Shaune Pollack, MD 09/25/22 [phone]

## 2022-09-25 NOTE — ED Notes (Signed)
Spoke with sister about discharge transport. Sister will be here within next hour to pick the patient up.

## 2022-09-25 NOTE — ED Notes (Signed)
This RN made an unsuccessful attempt to contact [facility] for transport.

## 2022-09-25 NOTE — ED Notes (Signed)
RN assisted pt with ambulation. Pt walked with stand by assistance with moderate steady gait. RN notified MD Erma Heritage. Per MD Isaacs, foley catheter removed.

## 2022-09-25 NOTE — ED Notes (Signed)
Pt given ice chips and tolerated well.

## 2022-09-25 NOTE — ED Notes (Signed)
Unsuccessful attempt to contact sister for discharge transport.

## 2022-09-25 NOTE — ED Notes (Signed)
RN at bedside to attempt to ambulate pt. When attempted to get pt out of bed pt states he has excruciating pain in his left hip. Pt slightly able to lift leg up but winces in pain when doing ROM. MD wong notified.

## 2022-09-26 LAB — CULTURE, BLOOD (ROUTINE X 2)

## 2022-09-30 LAB — CULTURE, BLOOD (ROUTINE X 2)
Culture: NO GROWTH
Culture: NO GROWTH
Special Requests: ADEQUATE

## 2022-10-18 DIAGNOSIS — J449 Chronic obstructive pulmonary disease, unspecified: Secondary | ICD-10-CM | POA: Diagnosis not present

## 2022-10-22 ENCOUNTER — Other Ambulatory Visit: Payer: Self-pay | Admitting: Physician Assistant

## 2022-10-22 ENCOUNTER — Other Ambulatory Visit: Payer: Self-pay

## 2022-10-22 ENCOUNTER — Other Ambulatory Visit: Payer: Self-pay | Admitting: Internal Medicine

## 2022-10-22 ENCOUNTER — Ambulatory Visit (INDEPENDENT_AMBULATORY_CARE_PROVIDER_SITE_OTHER): Payer: Medicare HMO

## 2022-10-22 VITALS — Ht 69.0 in | Wt 207.0 lb

## 2022-10-22 DIAGNOSIS — Z760 Encounter for issue of repeat prescription: Secondary | ICD-10-CM

## 2022-10-22 DIAGNOSIS — Z0000 Encounter for general adult medical examination without abnormal findings: Secondary | ICD-10-CM | POA: Diagnosis not present

## 2022-10-22 DIAGNOSIS — E119 Type 2 diabetes mellitus without complications: Secondary | ICD-10-CM

## 2022-10-22 DIAGNOSIS — K219 Gastro-esophageal reflux disease without esophagitis: Secondary | ICD-10-CM

## 2022-10-22 NOTE — Patient Instructions (Addendum)
[name] , Thank you for taking time to come for your Medicare Wellness Visit. I appreciate your ongoing commitment to your health goals. Please review the following plan we discussed and let me know if I can assist you in the future.   These are the goals we discussed:  Goals   None     This is a list of the screening recommended for you and due dates:  Health Maintenance  Topic Date Due   Complete foot exam   Never done   Eye exam for diabetics  Never done   DTaP/Tdap/Td vaccine (1 - Tdap) Never done   Zoster (Shingles) Vaccine (1 of 2) Never done   COVID-19 Vaccine (3 - Moderna risk series) 01/08/2021   Hemoglobin A1C  11/26/2022   Flu Shot  12/12/2022   Screening for Lung Cancer  12/14/2022   Yearly kidney health urinalysis for diabetes  06/29/2023   Yearly kidney function blood test for diabetes  09/25/2023   Medicare Annual Wellness Visit  10/22/2023   Colon Cancer Screening  03/15/2027   Pneumonia Vaccine  Completed   Hepatitis C Screening  Completed   HPV Vaccine  Aged Out    Advanced directives: yes  Conditions/risks identified: low falls risk  Next appointment: Follow up in one year for your annual wellness visit. 10/27/2023 @ 10:15am telephone  Preventive Care 65 Years and Older, Male  Preventive care refers to lifestyle choices and visits with your health care provider that can promote health and wellness. What does preventive care include? A yearly physical exam. This is also called an annual well check. Dental exams once or twice a year. Routine eye exams. Ask your health care provider how often you should have your eyes checked. Personal lifestyle choices, including: Daily care of your teeth and gums. Regular physical activity. Eating a healthy diet. Avoiding tobacco and drug use. Limiting alcohol use. Practicing safe sex. Taking low doses of aspirin every day. Taking vitamin and mineral supplements as recommended by your health care provider. What  happens during an annual well check? The services and screenings done by your health care provider during your annual well check will depend on your age, overall health, lifestyle risk factors, and family history of disease. Counseling  Your health care provider may ask you questions about your: Alcohol use. Tobacco use. Drug use. Emotional well-being. Home and relationship well-being. Sexual activity. Eating habits. History of falls. Memory and ability to understand (cognition). Work and work Astronomer. Screening  You may have the following tests or measurements: Height, weight, and BMI. Blood pressure. Lipid and cholesterol levels. These may be checked every 5 years, or more frequently if you are over 59 years old. Skin check. Lung cancer screening. You may have this screening every year starting at age 77 if you have a 30-pack-year history of smoking and currently smoke or have quit within the past 15 years. Fecal occult blood test (FOBT) of the stool. You may have this test every year starting at age 35. Flexible sigmoidoscopy or colonoscopy. You may have a sigmoidoscopy every 5 years or a colonoscopy every 10 years starting at age 1. Prostate cancer screening. Recommendations will vary depending on your family history and other risks. Hepatitis C blood test. Hepatitis B blood test. Sexually transmitted disease (STD) testing. Diabetes screening. This is done by checking your blood sugar (glucose) after you have not eaten for a while (fasting). You may have this done every 1-3 years. Abdominal aortic aneurysm (AAA) screening.  You may need this if you are a current or former smoker. Osteoporosis. You may be screened starting at age 26 if you are at high risk. Talk with your health care provider about your test results, treatment options, and if necessary, the need for more tests. Vaccines  Your health care provider may recommend certain vaccines, such as: Influenza vaccine. This  is recommended every year. Tetanus, diphtheria, and acellular pertussis (Tdap, Td) vaccine. You may need a Td booster every 10 years. Zoster vaccine. You may need this after age 68. Pneumococcal 13-valent conjugate (PCV13) vaccine. One dose is recommended after age 57. Pneumococcal polysaccharide (PPSV23) vaccine. One dose is recommended after age 38. Talk to your health care provider about which screenings and vaccines you need and how often you need them. This information is not intended to replace advice given to you by your health care provider. Make sure you discuss any questions you have with your health care provider. Document Released: 05/26/2015 Document Revised: 01/17/2016 Document Reviewed: 02/28/2015 Elsevier Interactive Patient Education  2017 [employer].  Fall Prevention in the Home Falls can cause injuries. They can happen to people of all ages. There are many things you can do to make your home safe and to help prevent falls. What can I do on the outside of my home? Regularly fix the edges of walkways and driveways and fix any cracks. Remove anything that might make you trip as you walk through a door, such as a raised step or threshold. Trim any bushes or trees on the path to your home. Use bright outdoor lighting. Clear any walking paths of anything that might make someone trip, such as rocks or tools. Regularly check to see if handrails are loose or broken. Make sure that both sides of any steps have handrails. Any raised decks and porches should have guardrails on the edges. Have any leaves, snow, or ice cleared regularly. Use sand or salt on walking paths during winter. Clean up any spills in your garage right away. This includes oil or grease spills. What can I do in the bathroom? Use night lights. Install grab bars by the toilet and in the tub and shower. Do not use towel bars as grab bars. Use non-skid mats or decals in the tub or shower. If you need to sit down  in the shower, use a plastic, non-slip stool. Keep the floor dry. Clean up any water that spills on the floor as soon as it happens. Remove soap buildup in the tub or shower regularly. Attach bath mats securely with double-sided non-slip rug tape. Do not have throw rugs and other things on the floor that can make you trip. What can I do in the bedroom? Use night lights. Make sure that you have a light by your bed that is easy to reach. Do not use any sheets or blankets that are too big for your bed. They should not hang down onto the floor. Have a firm chair that has side arms. You can use this for support while you get dressed. Do not have throw rugs and other things on the floor that can make you trip. What can I do in the kitchen? Clean up any spills right away. Avoid walking on wet floors. Keep items that you use a lot in easy-to-reach places. If you need to reach something above you, use a strong step stool that has a grab bar. Keep electrical cords out of the way. Do not use floor polish or wax  that makes floors slippery. If you must use wax, use non-skid floor wax. Do not have throw rugs and other things on the floor that can make you trip. What can I do with my stairs? Do not leave any items on the stairs. Make sure that there are handrails on both sides of the stairs and use them. Fix handrails that are broken or loose. Make sure that handrails are as long as the stairways. Check any carpeting to make sure that it is firmly attached to the stairs. Fix any carpet that is loose or worn. Avoid having throw rugs at the top or bottom of the stairs. If you do have throw rugs, attach them to the floor with carpet tape. Make sure that you have a light switch at the top of the stairs and the bottom of the stairs. If you do not have them, ask someone to add them for you. What else can I do to help prevent falls? Wear shoes that: Do not have high heels. Have rubber bottoms. Are comfortable  and fit you well. Are closed at the toe. Do not wear sandals. If you use a stepladder: Make sure that it is fully opened. Do not climb a closed stepladder. Make sure that both sides of the stepladder are locked into place. Ask someone to hold it for you, if possible. Clearly mark and make sure that you can see: Any grab bars or handrails. First and last steps. Where the edge of each step is. Use tools that help you move around (mobility aids) if they are needed. These include: Canes. Walkers. Scooters. Crutches. Turn on the lights when you go into a dark area. Replace any light bulbs as soon as they burn out. Set up your furniture so you have a clear path. Avoid moving your furniture around. If any of your floors are uneven, fix them. If there are any pets around you, be aware of where they are. Review your medicines with your doctor. Some medicines can make you feel dizzy. This can increase your chance of falling. Ask your doctor what other things that you can do to help prevent falls. This information is not intended to replace advice given to you by your health care provider. Make sure you discuss any questions you have with your health care provider. Document Released: 02/23/2009 Document Revised: 10/05/2015 Document Reviewed: 06/03/2014 Elsevier Interactive Patient Education  2017 [employer].

## 2022-10-22 NOTE — Progress Notes (Signed)
I connected with  [name] on 10/22/22 by a audio enabled telemedicine application and verified that I am speaking with the correct person using two identifiers.  Patient Location: Home  Provider Location: Office/Clinic  I discussed the limitations of evaluation and management by telemedicine. The patient expressed understanding and agreed to proceed.  Subjective:   [name] is a 72 y.o. male who presents for Medicare Annual/Subsequent preventive examination.  Review of Systems    Cardiac Risk Factors include: advanced age (>87men, >59 women);dyslipidemia;diabetes mellitus;hypertension;male gender;obesity (BMI >30kg/m2);sedentary lifestyle    Objective:    Today's Vitals   10/22/22 1025 10/22/22 1027  Weight: 207 lb (93.9 kg)   Height: 5\' 9"  (1.753 m)   PainSc:  6    Body mass index is 30.57 kg/m.     10/22/2022   10:40 AM 09/12/2022   11:01 AM 05/17/2022    1:52 PM 05/03/2022    3:23 PM 04/06/2022    8:16 AM 12/13/2021    1:54 PM 11/07/2021   11:10 AM  Advanced Directives  Does Patient Have a Medical Advance Directive? Yes Yes No No No No Yes  Type of Estate agent of [employer] Power of [place];Living will     Healthcare Power of [place];Living will  Would patient like information on creating a medical advance directive?    No - Patient declined  No - Patient declined     Current Medications (verified) Outpatient Encounter Medications as of 10/22/2022  Medication Sig   amitriptyline (ELAVIL) 75 MG tablet Take one tab po qhs for insomnia (Patient taking differently: Take 75 mg by mouth at bedtime. Take one tab po qhs for insomnia)   amLODipine (NORVASC) 5 MG tablet TAKE 1 TABLET BY MOUTH DAILY   ammonium lactate (AMLACTIN) 12 % lotion Apply 1 application. topically as needed for dry skin.   antiseptic oral rinse (BIOTENE) LIQD 15 mLs by Mouth Rinse route as needed for dry mouth.   aspirin EC 81 MG tablet Take 81 mg  by mouth daily. Swallow whole.   Blood Glucose Monitoring Suppl DEVI 1 each by Does not apply route in the morning, at noon, and at bedtime. May substitute to any manufacturer covered by patient's insurance.   buprenorphine (BUTRANS) 15 MCG/HR Place 1 patch onto the skin once a week.   Fluticasone-Umeclidin-Vilant (TRELEGY ELLIPTA) 100-62.5-25 MCG/ACT AEPB Inhale 1 puff into the lungs daily.   gabapentin (NEURONTIN) 600 MG tablet TAKE 1 TABLET BY MOUTH EVERY MORNING, 1 TABLET AT NOON, 1 TABLET IN THE EVENING AND 1 TABLET AT BEDTIME AS DIRECTED. *NOTE DOSEAGE CHANGE*   hydrOXYzine (VISTARIL) 25 MG capsule TAKE 1 CAPSULE BY MOUTH EVERY day as needed for anxiety   Ipratropium-Albuterol (COMBIVENT RESPIMAT) 20-100 MCG/ACT AERS respimat Inhale 1 puff into the lungs every 6 (six) hours as needed. Rescue inhaler   ipratropium-albuterol (DUONEB) 0.5-2.5 (3) MG/3ML SOLN Take 3 mLs by nebulization every 4 (four) hours as needed.   magic mouthwash (nystatin, lidocaine, diphenhydrAMINE, alum & mag hydroxide) suspension Swish and spit 5 mLs 3 (three) times daily as needed for mouth pain.   omeprazole (PRILOSEC) 40 MG capsule TAKE 1 CAPSULE BY MOUTH DAILY   revefenacin (YUPELRI) 175 MCG/3ML nebulizer solution Take 3 mLs (175 mcg total) by nebulization daily.   rosuvastatin (CRESTOR) 5 MG tablet TAKE 1 TABLET BY MOUTH DAILY. (Patient taking differently: Take 5 mg by mouth daily.)   tiZANidine (ZANAFLEX) 4 MG tablet Take 1 tablet (4  mg total) by mouth every 6 (six) hours as needed for muscle spasms.   Facility-Administered Encounter Medications as of 10/22/2022  Medication   heparin lock flush 100 unit/mL    Allergies (verified) Opana [oxymorphone hcl], Elemental sulfur, Oxymorphone, Oxymorphone, Sulfa antibiotics, and Sulfa antibiotics   History: Past Medical History:  Diagnosis Date   Alcohol abuse    Benzodiazepine dependence (HCC)    Benzodiazepine withdrawal (HCC)    Chronic pain in right foot     COPD (chronic obstructive pulmonary disease) (HCC)    Depression 08/19/2017   Hepatitis C    Hypertension    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HCC)    Seizures (HCC) 2010   Sleep apnea    Syncope    questionable vasovagal   Past Surgical History:  Procedure Laterality Date   COLONOSCOPY WITH PROPOFOL N/A 12/16/2016   Procedure: COLONOSCOPY WITH PROPOFOL;  Surgeon: Christena Deem, MD;  Location: [facility] ENDOSCOPY;  Service: Endoscopy;  Laterality: N/A;   COLONOSCOPY WITH PROPOFOL N/A 03/14/2017   Procedure: COLONOSCOPY WITH PROPOFOL;  Surgeon: Christena Deem, MD;  Location[facility] ENDOSCOPY;  Service: Endoscopy;  Laterality: N/A;   COLONOSCOPY WITH PROPOFOL     FOOT SURGERY Right 03/12/2002   FOOT SURGERY     HEMORRHOID SURGERY     LITHOTRIPSY     MYRINGOTOMY     MYRINGOTOMY WITH TUBE PLACEMENT Left 04/28/2019   Procedure: MYRINGOTOMY WITH TUBE PLACEMENT;  Surgeon: Bud Face, MD;  Location: [facility] SURGERY [facility];  Service: ENT;  Laterality: Left;   NASOPHARYNGOSCOPY N/A 04/28/2019   Procedure: NASOPHARYNGOSCOPY WITH BIOPSY;  Surgeon: Bud Face, MD;  Location: [facility] SURGERY CNTR;  Service: ENT;  Laterality: N/A;   NASOPHARYNGOSCOPY     Port a cath Placement     PORTA CATH INSERTION N/A 05/20/2019   Procedure: PORTA CATH INSERTION;  Surgeon: Annice Needy, MD;  Location: [facility] INVASIVE CV LAB;  Service: Cardiovascular;  Laterality: N/A;   PORTA CATH REMOVAL N/A 11/29/2020   Procedure: PORTA CATH REMOVAL;  Surgeon: Annice Needy, MD;  Location: [facility] INVASIVE CV LAB;  Service: Cardiovascular;  Laterality: N/A;   TONSILLECTOMY     Family History  Problem Relation Age of Onset   Arthritis Mother    Hypertension Mother    Cancer Father    Kidney disease Father    Alcohol abuse Paternal Aunt    Depression Maternal Grandfather    Social History   Socioeconomic History   Marital status: Divorced    Spouse name: Not on file   Number of children: Not on file   Years of education:  Not on file   Highest education level: Not on file  Occupational History   Not on file  Tobacco Use   Smoking status: Former    Packs/day: 0.50    Years: 50.00    Additional pack years: 0.00    Total pack years: 25.00    Types: Cigarettes    Quit date: 04/04/2022    Years since quitting: 0.5    Passive exposure: Past   Smokeless tobacco: Never  Vaping Use   Vaping Use: Never used  Substance and Sexual Activity   Alcohol use: Not Currently    Alcohol/week: 14.0 standard drinks of alcohol    Types: 14 Cans of beer per week   Drug use: Not Currently   Sexual activity: Not Currently  Other Topics Concern   Not on file  Social History Narrative   ** Merged History Encounter **  Social Determinants of Health   Financial Resource Strain: Low Risk  (10/22/2022)   Overall Financial Resource Strain (CARDIA)    Difficulty of Paying Living Expenses: Not hard at all  Food Insecurity: No Food Insecurity (10/22/2022)   Hunger Vital Sign    Worried About Running Out of Food in the Last Year: Never true    Ran Out of Food in the Last Year: Never true  Transportation Needs: No Transportation Needs (10/22/2022)   PRAPARE - Administrator, Civil Service (Medical): No    Lack of Transportation (Non-Medical): No  Physical Activity: Inactive (10/22/2022)   Exercise Vital Sign    Days of Exercise per Week: 0 days    Minutes of Exercise per Session: 0 min  Stress: Stress Concern Present (10/22/2022)   [employer] of Occupational Health - Occupational Stress Questionnaire    Feeling of Stress : To some extent  Social Connections: Socially Isolated (10/22/2022)   Social Connection and Isolation Panel [NHANES]    Frequency of Communication with Friends and Family: Three times a week    Frequency of Social Gatherings with Friends and Family: Once a week    Attends Religious Services: Never    Database administrator or Organizations: No    Attends Hospital doctor: Never    Marital Status: Divorced    Tobacco Counseling Counseling given: Not Answered   Clinical Intake:  Pre-visit preparation completed: Yes  Pain : 0-10 Pain Score: 6  Pain Type: Chronic pain Pain Location: Foot Pain Orientation: Right Pain Descriptors / Indicators: Aching, Sharp Pain Onset: More than a month ago Pain Frequency: Constant Pain Relieving Factors: patches,IBU  Pain Relieving Factors: patches,IBU  BMI - recorded: 30.57 Nutritional Status: BMI > 30  Obese Nutritional Risks: None Diabetes: Yes CBG done?: No Did pt. bring in CBG monitor from home?: No  How often do you need to have someone help you when you read instructions, pamphlets, or other written materials from your doctor or pharmacy?: 1 - Never  Diabetic?yes  Interpreter Needed?: No  Comments: lives alone Information entered by :: B.Zaylah Blecha,LPN   Activities of Daily Living    10/22/2022   10:40 AM 09/19/2022   11:08 AM  In your present state of health, do you have any difficulty performing the following activities:  Hearing? 0 0  Vision? 1 1  Difficulty concentrating or making decisions? 0 0  Walking or climbing stairs? 1 1  Dressing or bathing? 1 1  Doing errands, shopping? 0 0  Preparing Food and eating ? N   Using the Toilet? N   In the past six months, have you accidently leaked urine? N   Do you have problems with loss of bowel control? N   Managing your Medications? N   Managing your Finances? N   Housekeeping or managing your Housekeeping? N     Patient Care Team: Alfredia Ferguson, PA-C as PCP - General (Physician Assistant) Creig Hines, MD as Consulting Physician (Hematology and Oncology) Carmina Miller, MD as Radiation Oncologist (Radiation Oncology) Lyndon Code, MD (Internal Medicine)  Indicate any recent Medical Services you may have received from other than [facility] providers in the past year (date may be approximate).     Assessment:   This is a  routine wellness examination for [name].  Hearing/Vision screen Hearing Screening - Comments:: Adequate hearing Vision Screening - Comments:: Adequate vision;readers only Referral for eye exam made  Dietary issues and exercise activities discussed:  Current Exercise Habits: The patient does not participate in regular exercise at present, Exercise limited by: neurologic condition(s)   Goals Addressed   None    Depression Screen    10/22/2022   10:38 AM 09/19/2022   11:07 AM 06/28/2022    1:49 PM 05/28/2022    2:03 PM 05/21/2022   10:17 AM 05/09/2022   10:23 AM 02/04/2022    9:15 AM  PHQ 2/9 Scores  PHQ - 2 Score 1 1 0 0 0 0 0  PHQ- 9 Score  3 2        Fall Risk    10/22/2022   10:35 AM 09/19/2022   11:07 AM 09/12/2022   11:01 AM 07/29/2022    5:57 PM 06/28/2022    1:49 PM  Fall Risk   Falls in the past year? 0 0 0 1 1  Number falls in past yr: 0 0  1 0  Injury with Fall? 0   0 0  Risk for fall due to : No Fall Risks    History of fall(s)  Follow up Education provided;Falls prevention discussed    Falls evaluation completed    FALL RISK PREVENTION PERTAINING TO THE HOME:  Any stairs in or around the home? Yes  If so, are there any without handrails? Yes  Home free of loose throw rugs in walkways, pet beds, electrical cords, etc? Yes  Adequate lighting in your home to reduce risk of falls? Yes   ASSISTIVE DEVICES UTILIZED TO PREVENT FALLS:  Life alert? Yes  Use of a cane, walker or w/c? Yes cane rollater Grab bars in the bathroom? Yes  Shower chair or bench in shower? Yes  Elevated toilet seat or a handicapped toilet? Yes   Cognitive Function:    07/31/2021   10:20 AM 07/24/2020    8:48 AM 07/22/2019    9:05 AM 07/20/2018    8:37 AM  MMSE - Mini Mental State Exam  Orientation to time 5 5 5 5   Orientation to Place 5 5 5 5   Registration 3 3 3 3   Attention/ Calculation 5 5 5 5   Recall 3 3 3 3   Language- name 2 objects 2 2 2 2   Language- repeat 1 1 1 1   Language-  follow 3 step command 3 3 3 3   Language- read & follow direction 1 1 1 1   Write a sentence 1 1 1 1   Copy design 1 1 1 1   Total score 30 30 30 30         10/22/2022   10:44 AM  6CIT Screen  What Year? 0 points  What month? 0 points  What time? 0 points  Count back from 20 0 points  Months in reverse 0 points  Repeat phrase 0 points  Total Score 0 points    Immunizations Immunization History  Administered Date(s) Administered   Hep A / Hep B 04/15/2007   Influenza Inj Mdck Quad Pf 02/11/2022   Influenza-Unspecified 03/01/2020   Moderna SARS-COV2 Booster Vaccination 12/11/2020   Moderna Sars-Covid-2 Vaccination 06/01/2020, 07/07/2020   PNEUMOCOCCAL CONJUGATE-20 06/11/2022    TDAP status: Up to date  Flu Vaccine status: Up to date  Pneumococcal vaccine status: Declined,  Education has been provided regarding the importance of this vaccine but patient still declined. Advised may receive this vaccine at local pharmacy or Health Dept. Aware to provide a copy of the vaccination record if obtained from local pharmacy or Health Dept. Verbalized acceptance and understanding.  Covid-19 vaccine status: Completed vaccines  Qualifies for Shingles Vaccine? Yes   Zostavax completed No   Shingrix Completed?: No.    Education has been provided regarding the importance of this vaccine. Patient has been advised to call insurance company to determine out of pocket expense if they have not yet received this vaccine. Advised may also receive vaccine at local pharmacy or Health Dept. Verbalized acceptance and understanding.  Screening Tests Health Maintenance  Topic Date Due   FOOT EXAM  Never done   OPHTHALMOLOGY EXAM  Never done   DTaP/Tdap/Td (1 - Tdap) Never done   Zoster Vaccines- Shingrix (1 of 2) Never done   COVID-19 Vaccine (3 - Moderna risk series) 01/08/2021   HEMOGLOBIN A1C  11/26/2022   INFLUENZA VACCINE  12/12/2022   Lung Cancer Screening  12/14/2022   Diabetic kidney  evaluation - Urine ACR  06/29/2023   Diabetic kidney evaluation - eGFR measurement  09/25/2023   Medicare Annual Wellness (AWV)  10/22/2023   Colonoscopy  03/15/2027   Pneumonia Vaccine 66+ Years old  Completed   Hepatitis C Screening  Completed   HPV VACCINES  Aged Out    Health Maintenance  Health Maintenance Due  Topic Date Due   FOOT EXAM  Never done   OPHTHALMOLOGY EXAM  Never done   DTaP/Tdap/Td (1 - Tdap) Never done   Zoster Vaccines- Shingrix (1 of 2) Never done   COVID-19 Vaccine (3 - Moderna risk series) 01/08/2021    Colorectal cancer screening: Type of screening: Colonoscopy. Completed yes. Repeat every 10 years  Lung Cancer Screening: (Low Dose CT Chest recommended if Age 20-80 years, 30 pack-year currently smoking OR have quit w/in 15years.) does qualify.   Lung Cancer Screening Referral: no pt declines  Additional Screening:  Hepatitis C Screening: does not qualify; Completed yes  Vision Screening: Recommended annual ophthalmology exams for early detection of glaucoma and other disorders of the eye. Is the patient up to date with their annual eye exam?  No  Who is the provider or what is the name of the office in which the patient attends annual eye exams? none If pt is not established with a provider, would they like to be referred to a provider to establish care? Yes .   Dental Screening: Recommended annual dental exams for proper oral hygiene  Community Resource Referral / Chronic Care Management: CRR required this visit?  No   CCM required this visit?  No      Plan:     I have personally reviewed and noted the following in the patient's chart:   Medical and social history Use of alcohol, tobacco or illicit drugs  Current medications and supplements including opioid prescriptions. Patient is not currently taking opioid prescriptions. Functional ability and status Nutritional status Physical activity Advanced directives List of other  physicians Hospitalizations, surgeries, and ER visits in previous 12 months Vitals Screenings to include cognitive, depression, and falls Referrals and appointments  In addition, I have reviewed and discussed with patient certain preventive protocols, quality metrics, and best practice recommendations. A written personalized care plan for preventive services as well as general preventive health recommendations were provided to patient.     Sue Lush, LPN   1/61/0960   Nurse Notes: The patient states he is doing well and has no concerns or questions at this time.Pt has no eye PCP for exams; requests referral.  *referral for eye exam made

## 2022-10-23 ENCOUNTER — Other Ambulatory Visit: Payer: Self-pay | Admitting: Physician Assistant

## 2022-10-23 DIAGNOSIS — I10 Essential (primary) hypertension: Secondary | ICD-10-CM

## 2022-10-28 ENCOUNTER — Ambulatory Visit (INDEPENDENT_AMBULATORY_CARE_PROVIDER_SITE_OTHER): Payer: Medicare HMO | Admitting: Physician Assistant

## 2022-10-28 ENCOUNTER — Encounter: Payer: Self-pay | Admitting: Physician Assistant

## 2022-10-28 VITALS — BP 129/60 | HR 70 | Temp 97.7°F | Resp 13 | Ht 69.0 in | Wt 223.3 lb

## 2022-10-28 DIAGNOSIS — E119 Type 2 diabetes mellitus without complications: Secondary | ICD-10-CM | POA: Diagnosis not present

## 2022-10-28 LAB — POCT GLYCOSYLATED HEMOGLOBIN (HGB A1C): Hemoglobin A1C: 6.2 % — AB (ref 4.0–5.6)

## 2022-10-28 MED ORDER — LANCETS MISC. MISC
1.0000 | Freq: Three times a day (TID) | 0 refills | Status: AC
Start: 2022-10-28 — End: 2022-11-27

## 2022-10-28 MED ORDER — BLOOD GLUCOSE MONITORING SUPPL DEVI
1.0000 | Freq: Three times a day (TID) | 0 refills | Status: DC
Start: 2022-10-28 — End: 2023-11-20

## 2022-10-28 MED ORDER — LANCET DEVICE MISC
1.0000 | Freq: Three times a day (TID) | 0 refills | Status: AC
Start: 2022-10-28 — End: 2022-11-27

## 2022-10-28 MED ORDER — BLOOD GLUCOSE TEST VI STRP
1.0000 | ORAL_STRIP | Freq: Three times a day (TID) | 0 refills | Status: AC
Start: 2022-10-28 — End: 2022-11-27

## 2022-10-28 NOTE — Progress Notes (Signed)
I,Vanessa  Vital,acting as a Neurosurgeon for [employer], PA-C.,have documented all relevant documentation on the behalf of Alfredia Ferguson, PA-C,as directed by  Alfredia Ferguson, PA-C while in the presence of Alfredia Ferguson, PA-C.   Established patient visit   Patient: [name]   DOB: 1950/10/18   72 y.o. Male  MRN: [id number] Visit Date: 10/28/2022  Today's healthcare provider: Alfredia Ferguson, PA-C   Cc. DM f/u  Subjective    HPI  Patient states is needing elavil refilled and that his glucose monitor has not been working, reads 120 every time.   Diabetes Mellitus Type II, Follow-up  Lab Results  Component Value Date   HGBA1C 6.7 (H) 05/28/2022   Wt Readings from Last 3 Encounters:  10/28/22 223 lb 4.8 oz (101.3 kg)  10/22/22 207 lb (93.9 kg)  09/25/22 225 lb 15.5 oz (102.5 kg)   Last seen for diabetes 4 months ago.  Management since then includes no changes. He reports good compliance with treatment. He is not having side effects.  Symptoms: No fatigue No foot ulcerations  No appetite changes No nausea  No paresthesia of the feet  No polydipsia  No polyuria No visual disturbances   No vomiting     Home blood sugar records:  does not recall last time checked  Episodes of hypoglycemia? No     Pertinent Labs: Lab Results  Component Value Date   CHOL 180 05/04/2022   HDL 57 05/04/2022   LDLCALC 107 (H) 05/04/2022   TRIG 79 05/04/2022   CHOLHDL 3.2 05/04/2022   Lab Results  Component Value Date   NA 137 09/25/2022   K 4.1 09/25/2022   CREATININE 1.20 09/25/2022   GFRNONAA >60 09/25/2022   LABMICR <3.0 06/28/2022   MICRALBCREAT <9 06/28/2022     ---------------------------------------------------------------------------------------------------   Medications: Outpatient Medications Prior to Visit  Medication Sig   amLODipine (NORVASC) 5 MG tablet TAKE 1 TABLET BY MOUTH DAILY   ammonium lactate (AMLACTIN) 12 % lotion Apply 1 application.  topically as needed for dry skin.   antiseptic oral rinse (BIOTENE) LIQD 15 mLs by Mouth Rinse route as needed for dry mouth.   aspirin EC 81 MG tablet Take 81 mg by mouth daily. Swallow whole.   buprenorphine (BUTRANS) 15 MCG/HR Place 1 patch onto the skin once a week.   Fluticasone-Umeclidin-Vilant (TRELEGY ELLIPTA) 100-62.5-25 MCG/ACT AEPB Inhale 1 puff into the lungs daily.   Ipratropium-Albuterol (COMBIVENT RESPIMAT) 20-100 MCG/ACT AERS respimat Inhale 1 puff into the lungs every 6 (six) hours as needed. Rescue inhaler   ipratropium-albuterol (DUONEB) 0.5-2.5 (3) MG/3ML SOLN Take 3 mLs by nebulization every 4 (four) hours as needed.   omeprazole (PRILOSEC) 40 MG capsule TAKE 1 CAPSULE BY MOUTH ONCE DAILY   revefenacin (YUPELRI) 175 MCG/3ML nebulizer solution Take 3 mLs (175 mcg total) by nebulization daily.   rosuvastatin (CRESTOR) 5 MG tablet TAKE 1 TABLET BY MOUTH DAILY. (Patient taking differently: Take 5 mg by mouth daily.)   tiZANidine (ZANAFLEX) 4 MG tablet Take 1 tablet (4 mg total) by mouth every 6 (six) hours as needed for muscle spasms.   amitriptyline (ELAVIL) 75 MG tablet TAKE ONE TABLET BY MOUTH AT BEDTIME FOR INSOMNIA (Patient not taking: Reported on 10/28/2022)   gabapentin (NEURONTIN) 600 MG tablet TAKE 1 TABLET BY MOUTH EVERY MORNING, 1 TABLET AT NOON, 1 TABLET IN THE EVENING AND 1 TABLET AT BEDTIME AS DIRECTED. *NOTE DOSEAGE CHANGE* (Patient not taking: Reported on 10/28/2022)  hydrOXYzine (VISTARIL) 25 MG capsule TAKE 1 CAPSULE BY MOUTH EVERY day as needed for anxiety (Patient not taking: Reported on 10/28/2022)   magic mouthwash (nystatin, lidocaine, diphenhydrAMINE, alum & mag hydroxide) suspension Swish and spit 5 mLs 3 (three) times daily as needed for mouth pain. (Patient not taking: Reported on 10/28/2022)   [DISCONTINUED] Blood Glucose Monitoring Suppl DEVI 1 each by Does not apply route in the morning, at noon, and at bedtime. May substitute to any manufacturer covered by  patient's insurance. (Patient not taking: Reported on 10/28/2022)   Facility-Administered Medications Prior to Visit  Medication Dose Route Frequency Provider   heparin lock flush 100 unit/mL  500 Units Intravenous Once Creig Hines, MD    Review of Systems  Musculoskeletal:  Positive for back pain.  All other systems reviewed and are negative.    Objective    BP 129/60 (BP Location: Left Arm, Patient Position: Sitting, Cuff Size: Normal)   Pulse 70   Temp 97.7 F (36.5 C) (Oral)   Resp 13   Ht 5\' 9"  (1.753 m)   Wt 223 lb 4.8 oz (101.3 kg)   SpO2 97%   BMI 32.98 kg/m   Physical Exam Vitals reviewed.  Constitutional:      Appearance: He is not ill-appearing.  HENT:     Head: Normocephalic.  Eyes:     Conjunctiva/sclera: Conjunctivae normal.  Cardiovascular:     Rate and Rhythm: Normal rate.  Pulmonary:     Effort: Pulmonary effort is normal. No respiratory distress.  Neurological:     General: No focal deficit present.     Mental Status: He is alert and oriented to person, place, and time.  Psychiatric:        Mood and Affect: Mood normal.        Behavior: Behavior normal.     No results found for any visits on 10/28/22.  Assessment & Plan     Problem List Items Addressed This Visit       Endocrine   Type 2 diabetes mellitus without complication, without long-term current use of insulin (HCC) - Primary    A1c today improved to 6.2% previously 6.7% Only lifestyle changes On statin Uacr utd Sees podiatry  His monitor is genuinely broken tried to use it today F_u 6 mo      Relevant Medications   Blood Glucose Monitoring Suppl DEVI   Glucose Blood (BLOOD GLUCOSE TEST STRIPS) STRP   Lancet Device MISC   Lancets Misc. MISC   Other Relevant Orders   POCT HgB A1C    Return in about 6 months (around 04/29/2023) for chronic conditions.      I, Alfredia Ferguson, PA-C have reviewed all documentation for this visit. The documentation on  10/28/22   for the  exam, diagnosis, procedures, and orders are all accurate and complete.  Alfredia Ferguson, PA-C [facility] [address] [place], [place], [address] Office: [phone] Fax: [phone]   [facility]

## 2022-10-28 NOTE — Assessment & Plan Note (Addendum)
A1c today improved to 6.2% previously 6.7% Only lifestyle changes On statin Uacr utd Sees podiatry  His monitor is genuinely broken tried to use it today F_u 6 mo

## 2022-11-17 DIAGNOSIS — J449 Chronic obstructive pulmonary disease, unspecified: Secondary | ICD-10-CM | POA: Diagnosis not present

## 2022-11-19 ENCOUNTER — Encounter: Payer: Self-pay | Admitting: Oncology

## 2022-11-19 ENCOUNTER — Other Ambulatory Visit: Payer: Self-pay | Admitting: Physician Assistant

## 2022-11-19 ENCOUNTER — Inpatient Hospital Stay: Payer: Medicare HMO | Attending: Oncology | Admitting: Oncology

## 2022-11-19 VITALS — BP 171/65 | HR 77 | Temp 97.8°F | Ht 69.0 in | Wt 223.2 lb

## 2022-11-19 DIAGNOSIS — Z79899 Other long term (current) drug therapy: Secondary | ICD-10-CM | POA: Insufficient documentation

## 2022-11-19 DIAGNOSIS — Z9221 Personal history of antineoplastic chemotherapy: Secondary | ICD-10-CM | POA: Insufficient documentation

## 2022-11-19 DIAGNOSIS — Z923 Personal history of irradiation: Secondary | ICD-10-CM | POA: Insufficient documentation

## 2022-11-19 DIAGNOSIS — Z08 Encounter for follow-up examination after completed treatment for malignant neoplasm: Secondary | ICD-10-CM

## 2022-11-19 DIAGNOSIS — Z7982 Long term (current) use of aspirin: Secondary | ICD-10-CM | POA: Insufficient documentation

## 2022-11-19 DIAGNOSIS — Z8589 Personal history of malignant neoplasm of other organs and systems: Secondary | ICD-10-CM | POA: Diagnosis not present

## 2022-11-19 DIAGNOSIS — Z85818 Personal history of malignant neoplasm of other sites of lip, oral cavity, and pharynx: Secondary | ICD-10-CM | POA: Insufficient documentation

## 2022-11-19 DIAGNOSIS — Z87891 Personal history of nicotine dependence: Secondary | ICD-10-CM | POA: Diagnosis not present

## 2022-11-19 DIAGNOSIS — I10 Essential (primary) hypertension: Secondary | ICD-10-CM

## 2022-11-19 DIAGNOSIS — Z760 Encounter for issue of repeat prescription: Secondary | ICD-10-CM

## 2022-11-19 NOTE — Progress Notes (Signed)
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  Andee Poles for symptoms of ear pain and discharge.  Patient also noticed associated hearing loss and tinnitus.  Patient was noted to have erythema and edema in the left sphenopalatine fossa as well as an exophytic mass on NPL exam.  CT soft tissue of the neck showed asymmetric soft tissue within the left nasopharynx measuring 2.4 x 2 x 3.2 cm.  There is lateral bulging with encroachment upon the left parapharyngeal space without frank infiltration.  No extension into oropharynx or nasal cavity.  No skull base infiltration.  No appreciable mass within the larynx.  No pathologically enlarged cervical lymph nodes.  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  He was only able to receive 6 doses of weekly cisplatin 40 mg due to cytopenias and AKI.  He remains in remission  Interval history-patient was admitted to the hospital in June 2024 for symptoms of altered mental status and he was found to have tricyclics  and benzodiazepines in his urine.  Episode was self-limited.  Overall he is doing well and reports no difficulty swallowing.  Appetite and weight have remained stable and he is actively trying to lose weight.  ECOG PS- 1 Pain scale- 0   Review of systems- Review of Systems  Constitutional:  Negative for chills, fever, malaise/fatigue and weight loss.  HENT:  Negative for congestion, ear discharge and nosebleeds.   Eyes:  Negative for blurred vision.  Respiratory:  Negative for cough, hemoptysis, sputum production, shortness of breath and wheezing.   Cardiovascular:  Negative for chest pain, palpitations, orthopnea and claudication.  Gastrointestinal:  Negative for abdominal pain, blood in stool, constipation, diarrhea, heartburn, melena, nausea and vomiting.  Genitourinary:  Negative for dysuria, flank pain, frequency, hematuria and urgency.  Musculoskeletal:  Negative for back pain, joint pain and myalgias.  Skin:  Negative for rash.  Neurological:  Negative for dizziness, tingling, focal weakness, seizures, weakness and headaches.  Endo/Heme/Allergies:  Does not bruise/bleed easily.  Psychiatric/Behavioral:  Negative for depression and suicidal ideas. The patient does not have insomnia.       Allergies  Allergen Reactions   Opana [Oxymorphone Hcl]     Made him BLACKOUT   Elemental Sulfur     Childhood reaction    Oxymorphone Other (See Comments)   Oxymorphone    Sulfa Antibiotics Other (See Comments)   Sulfa Antibiotics      Past Medical History:  Diagnosis Date   Alcohol abuse    Benzodiazepine dependence (HCC)    Benzodiazepine withdrawal (HCC)    Chronic pain in right foot    COPD (chronic obstructive pulmonary disease) (HCC)  Depression 08/19/2017   Hepatitis C    Hypertension    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HCC)    Seizures (HCC) 2010   Sleep apnea    Syncope    questionable vasovagal     Past Surgical History:  Procedure Laterality Date   COLONOSCOPY WITH  PROPOFOL N/A 12/16/2016   Procedure: COLONOSCOPY WITH PROPOFOL;  Surgeon: Christena Deem, MD;  Location: [facility] ENDOSCOPY;  Service: Endoscopy;  Laterality: N/A;   COLONOSCOPY WITH PROPOFOL N/A 03/14/2017   Procedure: COLONOSCOPY WITH PROPOFOL;  Surgeon: Christena Deem, MD;  Location: [facility] ENDOSCOPY;  Service: Endoscopy;  Laterality: N/A;   COLONOSCOPY WITH PROPOFOL     FOOT SURGERY Right 03/12/2002   FOOT SURGERY     HEMORRHOID SURGERY     LITHOTRIPSY     MYRINGOTOMY     MYRINGOTOMY WITH TUBE PLACEMENT Left 04/28/2019   Procedure: MYRINGOTOMY WITH TUBE PLACEMENT;  Surgeon: Bud Face, MD;  Location: [facility];  Service: ENT;  Laterality: Left;   NASOPHARYNGOSCOPY N/A 04/28/2019   Procedure: NASOPHARYNGOSCOPY WITH BIOPSY;  Surgeon: Bud Face, MD;  Location: [facility];  Service: ENT;  Laterality: N/A;   NASOPHARYNGOSCOPY     Port a cath Placement     PORTA CATH INSERTION N/A 05/20/2019   Procedure: PORTA CATH INSERTION;  Surgeon: Annice Needy, MD;  Location: [facility] INVASIVE CV LAB;  Service: Cardiovascular;  Laterality: N/A;   PORTA CATH REMOVAL N/A 11/29/2020   Procedure: PORTA CATH REMOVAL;  Surgeon: Annice Needy, MD;  Location: [facility] INVASIVE CV LAB;  Service: Cardiovascular;  Laterality: N/A;   TONSILLECTOMY      Social History   Socioeconomic History   Marital status: Divorced    Spouse name: Not on file   Number of children: Not on file   Years of education: Not on file   Highest education level: Not on file  Occupational History   Not on file  Tobacco Use   Smoking status: Former    Packs/day: 0.50    Years: 50.00    Additional pack years: 0.00    Total pack years: 25.00    Types: Cigarettes    Quit date: 04/04/2022    Years since quitting: 0.6    Passive exposure: Past   Smokeless tobacco: Never  Vaping Use   Vaping Use: Never used  Substance and Sexual Activity   Alcohol use: Not Currently    Alcohol/week: 14.0 standard drinks of  alcohol    Types: 14 Cans of beer per week   Drug use: Not Currently   Sexual activity: Not Currently  Other Topics Concern   Not on file  Social History Narrative   ** Merged History Encounter **       Social Determinants of Health   Financial Resource Strain: Low Risk  (10/22/2022)   Overall Financial Resource Strain (CARDIA)    Difficulty of Paying Living Expenses: Not hard at all  Food Insecurity: No Food Insecurity (10/22/2022)   Hunger Vital Sign    Worried About Running Out of Food in the Last Year: Never true    Ran Out of Food in the Last Year: Never true  Transportation Needs: No Transportation Needs (10/22/2022)   PRAPARE - Administrator, Civil Service (Medical): No    Lack of Transportation (Non-Medical): No  Physical Activity: Inactive (10/22/2022)   Exercise Vital Sign    Days of Exercise per Week: 0 days    Minutes of Exercise per Session: 0  min  Stress: Stress Concern Present (10/22/2022)   [employer] of Occupational Health - Occupational Stress Questionnaire    Feeling of Stress : To some extent  Social Connections: Socially Isolated (10/22/2022)   Social Connection and Isolation Panel [NHANES]    Frequency of Communication with Friends and Family: Three times a week    Frequency of Social Gatherings with Friends and Family: Once a week    Attends Religious Services: Never    Database administrator or Organizations: No    Attends Banker Meetings: Never    Marital Status: Divorced  Catering manager Violence: Not At Risk (10/22/2022)   Humiliation, Afraid, Rape, and Kick questionnaire    Fear of Current or Ex-Partner: No    Emotionally Abused: No    Physically Abused: No    Sexually Abused: No    Family History  Problem Relation Age of Onset   Arthritis Mother    Hypertension Mother    Cancer Father    Kidney disease Father    Alcohol abuse Paternal Aunt    Depression Maternal Grandfather      Current Outpatient  Medications:    amLODipine (NORVASC) 5 MG tablet, TAKE 1 TABLET BY MOUTH DAILY, Disp: 90 tablet, Rfl: 1   antiseptic oral rinse (BIOTENE) LIQD, 15 mLs by Mouth Rinse route as needed for dry mouth., Disp: , Rfl:    aspirin EC 81 MG tablet, Take 81 mg by mouth daily. Swallow whole., Disp: , Rfl:    Blood Glucose Monitoring Suppl DEVI, 1 each by Does not apply route in the morning, at noon, and at bedtime. May substitute to any manufacturer covered by patient's insurance. Pt's current glucometer is broken., Disp: 1 each, Rfl: 0   buprenorphine (BUTRANS) 15 MCG/HR, Place 1 patch onto the skin once a week., Disp: 4 patch, Rfl: 2   Glucose Blood (BLOOD GLUCOSE TEST STRIPS) STRP, 1 each by In Vitro route in the morning, at noon, and at bedtime. May substitute to any manufacturer covered by patient's insurance., Disp: 100 strip, Rfl: 0   Ipratropium-Albuterol (COMBIVENT RESPIMAT) 20-100 MCG/ACT AERS respimat, Inhale 1 puff into the lungs every 6 (six) hours as needed. Rescue inhaler, Disp: 4 g, Rfl: 3   Lancet Device MISC, 1 each by Does not apply route in the morning, at noon, and at bedtime. May substitute to any manufacturer covered by patient's insurance., Disp: 1 each, Rfl: 0   Lancets Misc. MISC, 1 each by Does not apply route in the morning, at noon, and at bedtime. May substitute to any manufacturer covered by patient's insurance., Disp: 100 each, Rfl: 0   magic mouthwash (nystatin, lidocaine, diphenhydrAMINE, alum & mag hydroxide) suspension, Swish and spit 5 mLs 3 (three) times daily as needed for mouth pain., Disp: 180 mL, Rfl: 1   omeprazole (PRILOSEC) 40 MG capsule, TAKE 1 CAPSULE BY MOUTH ONCE DAILY, Disp: 90 capsule, Rfl: 1   rosuvastatin (CRESTOR) 5 MG tablet, TAKE 1 TABLET BY MOUTH DAILY. (Patient taking differently: Take 5 mg by mouth daily.), Disp: 90 tablet, Rfl: 3 No current facility-administered medications for this visit.  Facility-Administered Medications Ordered in Other Visits:     heparin lock flush 100 unit/mL, 500 Units, Intravenous, Once, Creig Hines, MD  Physical exam:  Vitals:   11/19/22 1400  BP: (!) 171/65  Pulse: 77  Temp: 97.8 F (36.6 C)  TempSrc: Tympanic  SpO2: 97%  Weight: 223 lb 3.2 oz (101.2 kg)  Height: 5\' 9"  (  1.753 m)   Physical Exam HENT:     Mouth/Throat:     Mouth: Mucous membranes are moist.     Pharynx: Oropharynx is clear.  Cardiovascular:     Rate and Rhythm: Normal rate and regular rhythm.     Heart sounds: Normal heart sounds.  Pulmonary:     Effort: Pulmonary effort is normal.     Breath sounds: Normal breath sounds.  Abdominal:     General: Bowel sounds are normal.     Palpations: Abdomen is soft.  Lymphadenopathy:     Comments: No palpable cervical adenopathy  Skin:    General: Skin is warm and dry.  Neurological:     Mental Status: He is alert and oriented to person, place, and time.         Latest Ref Rng & Units 09/25/2022    6:43 AM  CMP  Glucose 70 - 99 mg/dL 119   BUN 8 - 23 mg/dL 11   Creatinine 1.47 - 1.24 mg/dL 8.29   Sodium 562 - 130 mmol/L 137   Potassium 3.5 - 5.1 mmol/L 4.1   Chloride 98 - 111 mmol/L 99   CO2 22 - 32 mmol/L 27   Calcium 8.9 - 10.3 mg/dL 9.3   Total Protein 6.5 - 8.1 g/dL 8.2   Total Bilirubin 0.3 - 1.2 mg/dL 0.7   Alkaline Phos 38 - 126 U/L 74   AST 15 - 41 U/L 31   ALT 0 - 44 U/L 17       Latest Ref Rng & Units 09/25/2022    6:43 AM  CBC  WBC 4.0 - 10.5 K/uL 7.3   Hemoglobin 13.0 - 17.0 g/dL 86.5   Hematocrit 78.4 - 52.0 % 43.8   Platelets 150 - 400 K/uL 129     Assessment and plan- Patient is a 72 y.o. 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  Clinically patient is doing well with no concerning signs and symptoms of recurrence based on today's exam.  I encouraged him to follow-up with ENT as planned on a yearly basis.  I will see him back in 1 year   Visit Diagnosis 1. Encounter for  follow-up surveillance of head and neck cancer      Dr. Owens Shark, MD, MPH [facility] [phone] 11/19/2022 4:27 PM

## 2022-11-19 NOTE — Telephone Encounter (Signed)
Medication Refill - Medication: amLODipine (NORVASC) 5 MG tablet and amitriptyline (ELAVIL) 75 MG tablet    Has the patient contacted their pharmacy? Yes.   Pt told to contact provider  Preferred Pharmacy (with phone number or street name):  [facility] - [place], [place] - [address] Phone: [phone]  Fax: [phone]     Has the patient been seen for an appointment in the last year OR does the patient have an upcoming appointment? Yes.    Agent: Please be advised that RX refills may take up to 3 business days. We ask that you follow-up with your pharmacy.

## 2022-11-20 MED ORDER — AMLODIPINE BESYLATE 5 MG PO TABS: 5.0000 mg | ORAL_TABLET | Freq: Every day | ORAL | 3 refills | Status: DC

## 2022-11-20 NOTE — Telephone Encounter (Signed)
Unable to refill per protocol, Rx expired.  Amitriptyline discontinued 11/19/22.  Requested Prescriptions  Pending Prescriptions Disp Refills   amLODipine (NORVASC) 5 MG tablet 90 tablet 1    Sig: Take 1 tablet (5 mg total) by mouth daily.     Cardiovascular: Calcium Channel Blockers 2 Failed - 11/19/2022  3:56 PM      Failed - Last BP in normal range    BP Readings from Last 1 Encounters:  11/19/22 (!) 171/65         Passed - Last Heart Rate in normal range    Pulse Readings from Last 1 Encounters:  11/19/22 77         Passed - Valid encounter within last 6 months    Recent Outpatient Visits           3 weeks ago Type 2 diabetes mellitus without complication, without long-term current use of insulin (HCC)   [place] [facility] Edwards, [place], PA-C   2 months ago Low back pain due to bilateral sciatica   [place] [place] [facility] Alfredia Ferguson, PA-C   3 months ago Acute stress reaction   [place] [facility] Alfredia Ferguson, PA-C   4 months ago Type 2 diabetes mellitus without complication, without long-term current use of insulin (HCC)   [place] [facility] Edwards, [place], PA-C   5 months ago Panlobular emphysema [facility])   [place] [facility], Lillia Abed, PA-C               amitriptyline (ELAVIL) 75 MG tablet 90 tablet 1    Sig: TAKE ONE TABLET BY MOUTH AT BEDTIME FOR INSOMNIA     Psychiatry:  Antidepressants - Heterocyclics (TCAs) Passed - 11/19/2022  3:56 PM      Passed - Completed PHQ-2 or PHQ-9 in the last 360 days      Passed - Valid encounter within last 6 months    Recent Outpatient Visits           3 weeks ago Type 2 diabetes mellitus without complication, without long-term current use of insulin (HCC)   [place] [facility] Ok Edwards, [place], PA-C   2 months ago Low back pain due to bilateral sciatica   [place] [facility] Alfredia Ferguson, PA-C   3 months ago Acute stress reaction   [place] [facility] Alfredia Ferguson, PA-C   4 months ago Type 2 diabetes mellitus without complication, without long-term current use of insulin [facility])   [place] [facility] Ok Edwards, [place], PA-C   5 months ago Panlobular emphysema [facility])   [facility] Alfredia Ferguson, [place]

## 2022-11-20 NOTE — Telephone Encounter (Signed)
Requested medication (s) are due for refill today: yes  Requested medication (s) are on the active medication list: yes  Last refill:  12/14/21  Future visit scheduled: yes  Notes to clinic:  Unable to refill per protocol, last refill by another provider. Routing for approval.     Requested Prescriptions  Pending Prescriptions Disp Refills   amLODipine (NORVASC) 5 MG tablet 90 tablet 1    Sig: Take 1 tablet (5 mg total) by mouth daily.     Cardiovascular: Calcium Channel Blockers 2 Failed - 11/19/2022  3:56 PM      Failed - Last BP in normal range    BP Readings from Last 1 Encounters:  11/19/22 (!) 171/65         Passed - Last Heart Rate in normal range    Pulse Readings from Last 1 Encounters:  11/19/22 77         Passed - Valid encounter within last 6 months    Recent Outpatient Visits           3 weeks ago Type 2 diabetes mellitus without complication, without long-term current use of insulin (HCC)   [place] [facility] Ok Edwards, [place], PA-C   2 months ago Low back pain due to bilateral sciatica   [place] [facility] Alfredia Ferguson, PA-C   3 months ago Acute stress reaction   [place] [facility] Alfredia Ferguson, PA-C   4 months ago Type 2 diabetes mellitus without complication, without long-term current use of insulin (HCC)   [place] [facility] Ok Edwards, [place], PA-C   5 months ago Panlobular emphysema [facility])   [place] [facility] Ok Edwards, [place], PA-C              Refused Prescriptions Disp Refills   amitriptyline (ELAVIL) 75 MG tablet 90 tablet 1    Sig: TAKE ONE TABLET BY MOUTH AT BEDTIME FOR INSOMNIA     Psychiatry:  Antidepressants - Heterocyclics (TCAs) Passed - 11/19/2022  3:56 PM      Passed - Completed PHQ-2 or PHQ-9 in the last 360 days      Passed - Valid encounter within last 6 months    Recent Outpatient Visits           3 weeks ago Type 2 diabetes  mellitus without complication, without long-term current use of insulin (HCC)   [place] [facility] Ok Edwards, [place], PA-C   2 months ago Low back pain due to bilateral sciatica   [place] [facility] Alfredia Ferguson, PA-C   3 months ago Acute stress reaction   [place] [facility] Alfredia Ferguson, PA-C   4 months ago Type 2 diabetes mellitus without complication, without long-term current use of insulin [facility])   [place] [facility] Ok Edwards, [place], PA-C   5 months ago Panlobular emphysema [facility])   [place] [facility] Alfredia Ferguson, [place]

## 2022-11-27 ENCOUNTER — Ambulatory Visit
Payer: Medicare HMO | Attending: Student in an Organized Health Care Education/Training Program | Admitting: Student in an Organized Health Care Education/Training Program

## 2022-11-27 ENCOUNTER — Ambulatory Visit
Admission: RE | Admit: 2022-11-27 | Discharge: 2022-11-27 | Disposition: A | Discharge status: Home / Self Care | Payer: Medicare HMO | Source: Ambulatory Visit | Attending: Student in an Organized Health Care Education/Training Program | Admitting: Student in an Organized Health Care Education/Training Program

## 2022-11-27 ENCOUNTER — Encounter: Payer: Self-pay | Admitting: Student in an Organized Health Care Education/Training Program

## 2022-11-27 DIAGNOSIS — G5771 Causalgia of right lower limb: Secondary | ICD-10-CM | POA: Diagnosis not present

## 2022-11-27 MED ORDER — IOHEXOL 180 MG/ML  SOLN
10.0000 mL | Freq: Once | INTRAMUSCULAR | Status: AC
  Administered 2022-11-27: 10 mL via EPIDURAL
  Filled 2022-11-27: qty 20

## 2022-11-27 MED ORDER — DEXAMETHASONE SODIUM PHOSPHATE 10 MG/ML IJ SOLN
10.0000 mg | Freq: Once | INTRAMUSCULAR | Status: AC
  Administered 2022-11-27: 10 mg
  Filled 2022-11-27: qty 1

## 2022-11-27 MED ORDER — BUPIVACAINE-EPINEPHRINE (PF) 0.25% -1:200000 IJ SOLN
10.0000 mL | Freq: Once | INTRAMUSCULAR | Status: AC
  Administered 2022-11-27: 10 mL
  Filled 2022-11-27: qty 30

## 2022-11-27 MED ORDER — DIAZEPAM 5 MG PO TABS
5.0000 mg | ORAL_TABLET | ORAL | Status: AC
  Administered 2022-11-27: 5 mg via ORAL

## 2022-11-27 MED ORDER — DIAZEPAM 5 MG PO TABS
ORAL_TABLET | ORAL | Status: AC
  Filled 2022-11-27: qty 1

## 2022-11-27 MED ORDER — LIDOCAINE HCL 2 % IJ SOLN
20.0000 mL | Freq: Once | INTRAMUSCULAR | Status: AC
  Administered 2022-11-27: 100 mg
  Filled 2022-11-27: qty 40

## 2022-11-27 NOTE — Progress Notes (Signed)
Safety precautions to be maintained throughout the outpatient stay will include: orient to surroundings, keep bed in low position, maintain call bell within reach at all times, provide assistance with transfer out of bed and ambulation.  

## 2022-11-27 NOTE — Progress Notes (Signed)
PROVIDER NOTE: Interpretation of information contained herein should be left to medically-trained personnel. Specific patient instructions are provided elsewhere under "Patient Instructions" section of medical record. This document was created in part using STT-dictation technology, any transcriptional errors that may result from this process are unintentional.  Patient: [name] Type: Established DOB: 06-Oct-1950 MRN: [id number] PCP: Alfredia Ferguson, PA-C  Service: Procedure DOS: 11/27/2022 Setting: Ambulatory Location: Ambulatory outpatient facility Delivery: Face-to-face Provider: Edward Jolly, MD Specialty: Interventional Pain Management Specialty designation: 09 Location: Outpatient facility Ref. Prov.: Edward Jolly, MD       Interventional Therapy   Primary Reason for Visit: Interventional Pain Management Treatment. CC: Foot Pain (Right greater than left)    Procedure:          Anesthesia, Analgesia, Anxiolysis:  Type: Diagnostic Lumbar Sympathetic Block #1  Region:Lumbosacral Level: L3 Laterality: Right Paravertebral  Anesthesia: Local (1-2% Lidocaine)  Anxiolysis: None  Sedation: None  Guidance: Fluoroscopy           Position: Prone with head of the table was raised to facilitate breathing.   1. Complex regional pain syndrome type 2 of right lower extremity    NAS-11 Pain score:   Pre-procedure: 5 /10   Post-procedure: 5 /10      H&P (Pre-op Assessment):  [name] is a 72 y.o. (year old), male patient, seen today for interventional treatment. He  has a past surgical history that includes Colonoscopy with propofol (N/A, 12/16/2016); Colonoscopy with propofol (N/A, 03/14/2017); Myringotomy with tube placement (Left, 04/28/2019); Nasopharyngoscopy (N/A, 04/28/2019); Foot surgery (Right, 03/12/2002); PORTA CATH INSERTION (N/A, 05/20/2019); Lithotripsy; Hemorrhoid surgery; Tonsillectomy; Colonoscopy with propofol; Myringotomy; Nasopharyngoscopy; Foot surgery;  Port a cath Placement; and PORTA CATH REMOVAL (N/A, 11/29/2020). [name] has a current medication list which includes the following prescription(s): amlodipine, antiseptic oral rinse, aspirin ec, blood glucose monitoring suppl, buprenorphine, blood glucose test strips, combivent respimat, lancet device, lancets misc., magic mouthwash (nystatin, lidocaine, diphenhydrAMINE, alum & mag hydroxide) suspension, omeprazole, and rosuvastatin, and the following Facility-Administered Medications: heparin lock flush. His primarily concern today is the Foot Pain (Right greater than left)  Initial Vital Signs:  Pulse/HCG Rate: 85ECG Heart Rate: 86 Temp:  (left 88.6 right 91.4 F pre procedure) Resp: 16 BP: (!) 159/85 SpO2: 98 %  BMI: Estimated body mass index is 31.75 kg/m as calculated from the following:   Height as of this encounter: 5\' 9"  (1.753 m).   Weight as of this encounter: 215 lb (97.5 kg).  Risk Assessment: Allergies: Reviewed. He is allergic to opana [oxymorphone hcl], elemental sulfur, oxymorphone, oxymorphone, sulfa antibiotics, and sulfa antibiotics.  Allergy Precautions: None required Coagulopathies: Reviewed. None identified.  Blood-thinner therapy: None at this time Active Infection(s): Reviewed. None identified. [name] is afebrile  Site Confirmation: [name] was asked to confirm the procedure and laterality before marking the site Procedure checklist: Completed Consent: Before the procedure and under the influence of no sedative(s), amnesic(s), or anxiolytics, the patient was informed of the treatment options, risks and possible complications. To fulfill our ethical and legal obligations, as recommended by the American Medical Association's Code of Ethics, I have informed the patient of my clinical impression; the nature and purpose of the treatment or procedure; the risks, benefits, and possible complications of the intervention; the alternatives, including doing nothing;  the risk(s) and benefit(s) of the alternative treatment(s) or procedure(s); and the risk(s) and benefit(s) of doing nothing. The patient was provided information about the general risks and possible complications associated  with the procedure. These may include, but are not limited to: failure to achieve desired goals, infection, bleeding, organ or nerve damage, allergic reactions, paralysis, and death. In addition, the patient was informed of those risks and complications associated to Spine-related procedures, such as failure to decrease pain; infection (i.e.: Meningitis, epidural or intraspinal abscess); bleeding (i.e.: epidural hematoma, subarachnoid hemorrhage, or any other type of intraspinal or peri-dural bleeding); organ or nerve damage (i.e.: Any type of peripheral nerve, nerve root, or spinal cord injury) with subsequent damage to sensory, motor, and/or autonomic systems, resulting in permanent pain, numbness, and/or weakness of one or several areas of the body; allergic reactions; (i.e.: anaphylactic reaction); and/or death. Furthermore, the patient was informed of those risks and complications associated with the medications. These include, but are not limited to: allergic reactions (i.e.: anaphylactic or anaphylactoid reaction(s)); adrenal axis suppression; blood sugar elevation that in diabetics may result in ketoacidosis or comma; water retention that in patients with history of congestive heart failure may result in shortness of breath, pulmonary edema, and decompensation with resultant heart failure; weight gain; swelling or edema; medication-induced neural toxicity; particulate matter embolism and blood vessel occlusion with resultant organ, and/or nervous system infarction; and/or aseptic necrosis of one or more joints. Finally, the patient was informed that Medicine is not an exact science; therefore, there is also the possibility of unforeseen or unpredictable risks and/or possible  complications that may result in a catastrophic outcome. The patient indicated having understood very clearly. We have given the patient no guarantees and we have made no promises. Enough time was given to the patient to ask questions, all of which were answered to the patient's satisfaction. [name] has indicated that he wanted to continue with the procedure. Attestation: I, the ordering provider, attest that I have discussed with the patient the benefits, risks, side-effects, alternatives, likelihood of achieving goals, and potential problems during recovery for the procedure that I have provided informed consent. Date  Time: 11/27/2022 11:07 AM   Pre-Procedure Preparation:  Monitoring: As per clinic protocol. Respiration, ETCO2, SpO2, BP, heart rate and rhythm monitor placed and checked for adequate function Safety Precautions: Patient was assessed for positional comfort and pressure points before starting the procedure. Time-out: I initiated and conducted the "Time-out" before starting the procedure, as per protocol. The patient was asked to participate by confirming the accuracy of the "Time Out" information. Verification of the correct person, site, and procedure were performed and confirmed by me, the nursing staff, and the patient. "Time-out" conducted as per Joint Commission's Universal Protocol (UP.01.01.01). Time: 1137 Start Time: 1137 hrs.  Description of Procedure:          Target Area: For Lumbar Sympathetic Block(s), the target is the anterolateral aspect of the L3 vertebral bodies, where the lumbar sympathetic chain resides. Approach: Paravertebral, ipsilateral approach. Area Prepped: Entire Posterior Thoracolumbar Region DuraPrep (Iodine Povacrylex [0.7% available iodine] and Isopropyl Alcohol, 74% w/w) Safety Precautions: Aspiration looking for blood return was conducted prior to all injections. At no point did we inject any substances, as a needle was being advanced. No  attempts were made at seeking any paresthesias. Safe injection practices and needle disposal techniques used. Medications properly checked for expiration dates. SDV (single dose vial) medications used. Description of the Procedure: Protocol guidelines were followed. The patient was placed in position over the procedure table. The target area was identified and the area prepped in the usual manner. Skin & deeper tissues infiltrated with local anesthetic. Appropriate amount  of time allowed to pass for local anesthetics to take effect. The procedure needles were then advanced to the target area, the superior anterolateral border of the L3 vertebral body, under pulsed fluoroscopic guidance. Care was taken not to advance the tip of the needle past the anterior border of the vertebral body, on the lateral fluoroscopic view. Proper needle placement secured. Negative aspiration confirmed. Solution injected in intermittent fashion, asking for systemic symptoms every 0.5cc of injectate. The needles were then removed and the area cleansed, making sure to leave some of the prepping solution back to take advantage of its long term bactericidal properties. Vitals:   11/27/22 1135 11/27/22 1140 11/27/22 1145 11/27/22 1151  BP: (!) 137/96 137/83 (!) 147/80 (!) 158/95  Pulse: 87     Resp: 15 17 18 18   SpO2: 95% 95% 95% 95%  Weight:      Height:        Start Time: 1137 hrs. End Time: 1150 hrs. Materials:  Needle(s) Type: Spinal Needle Gauge: 22G Length: 7-in Medication(s): Please see orders for medications and dosing details.  Imaging Guidance (Spinal):          Type of Imaging Technique: Fluoroscopy Guidance (Spinal) Indication(s): Assistance in needle guidance and placement for procedures requiring needle placement in or near specific anatomical locations not easily accessible without such assistance. Exposure Time: Please see nurses notes. Contrast: Before injecting any contrast, we confirmed that the  patient did not have an allergy to iodine, shellfish, or radiological contrast. Once satisfactory needle placement was completed at the desired level, radiological contrast was injected. Contrast injected under live fluoroscopy. No contrast complications. See chart for type and volume of contrast used. Fluoroscopic Guidance: I was personally present during the use of fluoroscopy. "Tunnel Vision Technique" used to obtain the best possible view of the target area. Parallax error corrected before commencing the procedure. "Direction-depth-direction" technique used to introduce the needle under continuous pulsed fluoroscopy. Once target was reached, antero-posterior, oblique, and lateral fluoroscopic projection used confirm needle placement in all planes. Images permanently stored in EMR. Interpretation: I personally interpreted the imaging intraoperatively. Adequate needle placement confirmed in multiple planes. Appropriate spread of contrast into desired area was observed. No evidence of afferent or efferent intravascular uptake. No intrathecal or subarachnoid spread observed. Permanent images saved into the patient's record.  Antibiotic Prophylaxis:   Anti-infectives (From admission, onward)    None      Indication(s): None identified  Post-operative Assessment:  Post-procedure Vital Signs:  Pulse/HCG Rate: 8786 Temp:  (right 91.5 left 87.6 F) Resp: 18 BP: (!) 158/95 SpO2: 95 %  EBL: None  Complications: No immediate post-treatment complications observed by team, or reported by patient.  Note: The patient tolerated the entire procedure well. A repeat set of vitals were taken after the procedure and the patient was kept under observation following institutional policy, for this type of procedure. Post-procedural neurological assessment was performed, showing return to baseline, prior to discharge. The patient was provided with post-procedure discharge instructions, including a section on how  to identify potential problems. Should any problems arise concerning this procedure, the patient was given instructions to immediately contact us, at any time, without hesitation. In any case, we plan to contact the patient by telephone for a follow-up status report regarding this interventional procedure.  Comments:  No additional relevant information.  5 out of 5 strength bilateral lower extremity: Plantar flexion, dorsiflexion, knee flexion, knee extension.   Plan of Care (POC)  Orders:  Orders Placed  This Encounter  Procedures   DG PAIN CLINIC C-ARM 1-60 MIN NO REPORT    Intraoperative interpretation by procedural physician at [facility].    Standing Status:   Standing    Number of Occurrences:   1    Order Specific Question:   Reason for exam:    Answer:   Assistance in needle guidance and placement for procedures requiring needle placement in or near specific anatomical locations not easily accessible without such assistance.     Medications ordered for procedure: Meds ordered this encounter  Medications   iohexol (OMNIPAQUE) 180 MG/ML injection 10 mL    Must be Myelogram-compatible. If not available, you may substitute with a water-soluble, non-ionic, hypoallergenic, myelogram-compatible radiological contrast medium.   lidocaine (XYLOCAINE) 2 % (with pres) injection 400 mg   diazepam (VALIUM) tablet 5 mg    Make sure Flumazenil is available in the pyxis when using this medication. If oversedation occurs, administer 0.2 mg West over 15 sec. If after 45 sec no response, administer 0.2 mg again over 1 min; may repeat at 1 min intervals; not to exceed 4 doses (1 mg)   bupivacaine-epinephrine (PF) (MARCAINE W/ EPI) 0.25% -1:200000 injection 10 mL   dexamethasone (DECADRON) injection 10 mg   Medications administered: We administered iohexol, lidocaine, diazepam, bupivacaine-epinephrine (PF), and dexamethasone.  See the medical record for exact dosing, route, and time of  administration.  Follow-up plan:   Return keep scheduled appt in August.      Recent Visits Date Type Provider Dept  09/12/22 Office Visit Edward Jolly, MD [facility]  Showing recent visits within past 90 days and meeting all other requirements Today's Visits Date Type Provider Dept  11/27/22 Procedure visit Edward Jolly, MD [facility]  Showing today's visits and meeting all other requirements Future Appointments Date Type Provider Dept  12/12/22 Appointment Edward Jolly, MD [facility]  Showing future appointments within next 90 days and meeting all other requirements  Disposition: Discharge home  Discharge (Date  Time): 11/27/2022; 1200 hrs.   Primary Care Physician: Alfredia Ferguson, PA-C Location: [facility] Note by: Edward Jolly, MD (TTS technology used. I apologize for any typographical errors that were not detected and corrected.) Date: 11/27/2022; Time: 12:04 PM  Disclaimer:  Medicine is not an Visual merchandiser. The only guarantee in medicine is that nothing is guaranteed. It is important to note that the decision to proceed with this intervention was based on the information collected from the patient. The Data and conclusions were drawn from the patient's questionnaire, the interview, and the physical examination. Because the information was provided in large part by the patient, it cannot be guaranteed that it has not been purposely or unconsciously manipulated. Every effort has been made to obtain as much relevant data as possible for this evaluation. It is important to note that the conclusions that lead to this procedure are derived in large part from the available data. Always take into account that the treatment will also be dependent on availability of resources and existing treatment guidelines, considered by other Pain Management Practitioners as being common knowledge and practice, at the time of the  intervention. For Medico-Legal purposes, it is also important to point out that variation in procedural techniques and pharmacological choices are the acceptable norm. The indications, contraindications, technique, and results of the above procedure should only be interpreted and judged by a Board-Certified Interventional Pain Specialist with extensive familiarity and expertise in the same  exact procedure and technique.

## 2022-11-27 NOTE — Patient Instructions (Signed)
 Pain Management Discharge Instructions  General Discharge Instructions :  If you need to reach your doctor call: Monday-Friday 8:00 am - 4:00 pm at [phone] or toll free [phone].  After clinic hours [phone] to have operator reach doctor.  Bring all of your medication bottles to all your appointments in the pain clinic.  To cancel or reschedule your appointment with Pain Management please remember to call 24 hours in advance to avoid a fee.  Refer to the educational materials which you have been given on: General Risks, I had my Procedure. Discharge Instructions, Post Sedation.  Post Procedure Instructions:  The drugs you were given will stay in your system until tomorrow, so for the next 24 hours you should not drive, make any legal decisions or drink any alcoholic beverages.  You may eat anything you prefer, but it is better to start with liquids then soups and crackers, and gradually work up to solid foods.  Please notify your doctor immediately if you have any unusual bleeding, trouble breathing or pain that is not related to your normal pain.  Depending on the type of procedure that was done, some parts of your body may feel week and/or numb.  This usually clears up by tonight or the next day.  Walk with the use of an assistive device or accompanied by an adult for the 24 hours.  You may use ice on the affected area for the first 24 hours.  Put ice in a Ziploc bag and cover with a towel and place against area 15 minutes on 15 minutes off.  You may switch to heat after 24 hours.Lumbar Sympathetic Block Patient Information  Description: The lumbar plexus is a group of nerves that are part of the sympathetic nervous system.  These nerves supply organs in the pelvis and legs.  Lumbar sympathetic blocks are utilized for the diagnosis and treatment of painful conditions in these areas.   The lumbar plexus is located on both sides of the aorta at approximately the level of  the second lumbar vertebral body.  The block will be performed with you lying on your abdomen with a pillow underneath.  Using direct x-ray guidance,   The plexus will be located on both sides of the spine.  Numbing medicine will be used to deaden the skin prior to needle insertion.  In most cases, a small amount of sedation can be give by IV prior to the numbing medicine.  One or two small needles will be placed near the plexus and local anesthetic will be injected.  This may make your leg(s) feel warm.  The Entire block usually lasts about 15-25 minutes.  Conditions which may be treated by lumbar sympathetic block:  Reflex sympathetic dystrophy Phantom limb pain Peripheral neuropathy Peripheral vascular disease ( inadequate blood flow ) Cancer pain of pelvis, leg and kidney  Preparation for the injection:  Do note eat any solid food or diary products within 8 hours of your appointment. You may drink clear liquids up to 3 hours before appointment.  Clear liquids include water, black coffee, juice or soda.  No milk or cream please. You may take your regular medication, including pain medications, with a sip of water before you appointment.  Diabetics should hold regular insulin ( if taken separately ) and take 1/2 NPH dose the morning of the procedure .  Carry some sugar containing items with you to your appointment. A driver must accompany you and be prepared to drive you home after your  procedure. Bring all your current medication with you. An IV may be inserted and sedation may be given at the discretion of the physician.  A blood pressure cuff, EKG and other monitors will often be applied during the procedure.  Some patients may need to have extra oxygen administered for a short period. You will be asked to provide medical information, including your allergies and medications, prior to the procedure.  We must know immediately if your taking blood thinners (like Coumadin/Warfarin) or if you are  allergic to IV iodine contrast (dye).  We must know if you could possibly be pregnant.  Possible side-effects  Bleeding from needle site or deeper Infection (rare, can require surgery) Nerve injury (rare) Numbness & tingling (temporary) Collapsed lung (rare) Spinal headache (a headache worse with upright posture) Light-headedness (temporary) Pain at injection site (several days) Decreased blood pressure (temporary) Weakness in legs (temporary) Seizure or other drug reaction (rare)  Call if you experience:  Fever/chills associated with headache or increased back/ neck pain Headache worsened by an upright position New onset weakness or numbness of an extremity below the injection site Hives or difficulty breathing ( go to the emergency room) Inflammation or drainage at the injections site(s) New symptoms which are concerning to you  Please note:  If effective, we will often do a series of 2-3 injections spaced 3-6 weeks apart to maximally decrease your pain.  If initial series is effective, you may be a candidate for a more permanent block of the lumbar sympathetic plexus.  If you have any questions please call [phone] [facility]

## 2022-11-28 ENCOUNTER — Telehealth: Payer: Self-pay | Admitting: *Deleted

## 2022-11-28 NOTE — Telephone Encounter (Signed)
Called for post procedure check. No answer. LVM. 

## 2022-12-12 ENCOUNTER — Encounter: Payer: Self-pay | Admitting: Student in an Organized Health Care Education/Training Program

## 2022-12-12 ENCOUNTER — Ambulatory Visit
Payer: Medicare HMO | Attending: Student in an Organized Health Care Education/Training Program | Admitting: Student in an Organized Health Care Education/Training Program

## 2022-12-12 VITALS — BP 138/82 | HR 85 | Temp 97.3°F | Ht 69.0 in | Wt 215.0 lb

## 2022-12-12 DIAGNOSIS — G894 Chronic pain syndrome: Secondary | ICD-10-CM | POA: Insufficient documentation

## 2022-12-12 DIAGNOSIS — T451X5A Adverse effect of antineoplastic and immunosuppressive drugs, initial encounter: Secondary | ICD-10-CM

## 2022-12-12 DIAGNOSIS — F321 Major depressive disorder, single episode, moderate: Secondary | ICD-10-CM | POA: Insufficient documentation

## 2022-12-12 DIAGNOSIS — G620 Drug-induced polyneuropathy: Secondary | ICD-10-CM | POA: Diagnosis not present

## 2022-12-12 DIAGNOSIS — D701 Agranulocytosis secondary to cancer chemotherapy: Secondary | ICD-10-CM | POA: Diagnosis not present

## 2022-12-12 DIAGNOSIS — G5771 Causalgia of right lower limb: Secondary | ICD-10-CM

## 2022-12-12 MED ORDER — BUPRENORPHINE 15 MCG/HR TD PTWK
1.0000 | MEDICATED_PATCH | TRANSDERMAL | 2 refills | Status: DC
Start: 2023-01-26 — End: 2022-12-12

## 2022-12-12 MED ORDER — BUPRENORPHINE 15 MCG/HR TD PTWK
1.0000 | MEDICATED_PATCH | TRANSDERMAL | 2 refills | Status: AC
Start: 2023-01-26 — End: 2023-04-20

## 2022-12-12 NOTE — Progress Notes (Signed)
PROVIDER NOTE: Information contained herein reflects review and annotations entered in association with encounter. Interpretation of such information and data should be left to medically-trained personnel. Information provided to patient can be located elsewhere in the medical record under "Patient Instructions". Document created using STT-dictation technology, any transcriptional errors that may result from process are unintentional.    Patient: [name]  Service Category: E/M  Provider: Edward Jolly, MD  DOB: [birth year]  DOS: 12/12/2022  Referring Provider: Burnett Corrente  MRN: [id number]  Specialty: Interventional Pain Management  PCP: Alfredia Ferguson, PA-C  Type: Established Patient  Setting: Ambulatory outpatient    Location: Office  Delivery: Face-to-face     HPI  [name], a 72 y.o. year old male, is here today because of his Complex regional pain syndrome type 2 of right lower extremity [G57.71]. [name] primary complain today is Foot Pain (bilateral)  Pertinent problems: [name] has Seizure disorder [facility]); Alcohol abuse; Tobacco use disorder; Nasopharyngeal carcinoma (HCC); and Chronic pain syndrome on their pertinent problem list. Pain Assessment: Severity of Chronic pain is reported as a 3 /10. Location: Foot Right, Left/denies, right is worse. Onset: More than a month ago. Quality: Burning, Headache, Numbness, Tiring. Timing: Constant. Modifying factor(s):  Marland Kitchen Vitals:  height is 5\' 9"  (1.753 m) and weight is 215 lb (97.5 kg). His temperature is 97.3 F (36.3 C) (abnormal). His blood pressure is 138/82 and his pulse is 85.  BMI: Estimated body mass index is 31.75 kg/m as calculated from the following:   Height as of this encounter: 5\' 9"  (1.753 m).   Weight as of this encounter: 215 lb (97.5 kg). Last encounter: 09/12/2022. Last procedure: 11/27/2022.  Reason for encounter: both, medication management and post-procedure evaluation and  assessment.    Post-procedure evaluation    Procedure:          Anesthesia, Analgesia, Anxiolysis:  Type: Diagnostic Lumbar Sympathetic Block #1  Region:Lumbosacral Level: L3 Laterality: Right Paravertebral  Anesthesia: Local (1-2% Lidocaine)  Anxiolysis: None  Sedation: None  Guidance: Fluoroscopy           Position: Prone with head of the table was raised to facilitate breathing.   1. Complex regional pain syndrome type 2 of right lower extremity    NAS-11 Pain score:   Pre-procedure: 5 /10   Post-procedure: 5 /10      Effectiveness:  Initial hour after procedure: 100 %  Subsequent 4-6 hours post-procedure: 90 %  Analgesia past initial 6 hours: 75 % (current)  Ongoing improvement:  Analgesic:  75% Function: [name] reports improvement in function ROM: [name] reports improvement in ROM   Pharmacotherapy Assessment  Analgesic: Butrans 15 mcg/hr   Monitoring: [place] PMP: PDMP reviewed during this encounter.       Pharmacotherapy: No side-effects or adverse reactions reported. Compliance: No problems identified. Effectiveness: Clinically acceptable.  Newman Pies, RN  12/12/2022  9:35 AM  Sign when Signing Visit Safety precautions to be maintained throughout the outpatient stay will include: orient to surroundings, keep bed in low position, maintain call bell within reach at all times, provide assistance with transfer out of bed and ambulation.   Patient did not bring medications with him today.     No results found for: "CBDTHCR" No results found for: "D8THCCBX" No results found for: "D9THCCBX"  UDS:  Summary  Date Value Ref Range Status  09/05/2021 Note  Final    Comment:    ==================================================================== Compliance  Drug Analysis, Ur ==================================================================== Test                             Result       Flag       Units  Drug Present and Declared for Prescription  Verification   Gabapentin                     PRESENT      EXPECTED   Amitriptyline                  PRESENT      EXPECTED   Nortriptyline                  PRESENT      EXPECTED    Nortriptyline is an expected metabolite of amitriptyline.    Chlorpheniramine               PRESENT      EXPECTED   Hydroxyzine                    PRESENT      EXPECTED  Drug Present not Declared for Prescription Verification   Carboxy-THC                    15           UNEXPECTED ng/mg creat    Carboxy-THC is a metabolite of tetrahydrocannabinol (THC). Source of    THC is most commonly herbal marijuana or marijuana-based products,    but THC is also present in a scheduled prescription medication.    Trace amounts of THC can be present in hemp and cannabidiol (CBD)    products. This test is not intended to distinguish between delta-9-    tetrahydrocannabinol, the predominant form of THC in most herbal or    marijuana-based products, and delta-8-tetrahydrocannabinol.    Doxylamine                     PRESENT      UNEXPECTED  Drug Absent but Declared for Prescription Verification   Hydrocodone                    Not Detected UNEXPECTED ng/mg creat   Tizanidine                     Not Detected UNEXPECTED    Tizanidine, as indicated in the declared medication list, is not    always detected even when used as directed.    Salicylate                     Not Detected UNEXPECTED    Aspirin, as indicated in the declared medication list, is not always    detected even when used as directed.  ==================================================================== Test                      Result    Flag   Units      Ref Range   Creatinine              26               mg/dL      >=16 ==================================================================== Declared Medications:  The flagging and interpretation on this report are based on the  following declared medications.  Unexpected results may arise  from   inaccuracies in the declared medications.   **Note: The testing scope of this panel includes these medications:   Amitriptyline (Elavil)  Chlorpheniramine  Gabapentin (Neurontin)  Hydrocodone  Hydroxyzine (Vistaril)   **Note: The testing scope of this panel does not include small to  moderate amounts of these reported medications:   Aspirin  Tizanidine (Zanaflex)   **Note: The testing scope of this panel does not include the  following reported medications:   Amlodipine (Norvasc)  Ammonium Hydroxide (Ammonium Lactate)  Azithromycin (Zithromax)  Benzonatate (Tessalon)  Fluoride (Biotene)  Lactic Acid (Ammonium Lactate)  Mouthwash  Multivitamin  Omeprazole (Prilosec)  Rosuvastatin (Crestor) ==================================================================== For clinical consultation, please call [phone]. ====================================================================       ROS  Constitutional: Denies any fever or chills Gastrointestinal: No reported hemesis, hematochezia, vomiting, or acute GI distress Musculoskeletal: Denies any acute onset joint swelling, redness, loss of ROM, or weakness Neurological: No reported episodes of acute onset apraxia, aphasia, dysarthria, agnosia, amnesia, paralysis, loss of coordination, or loss of consciousness  Medication Review  Blood Glucose Monitoring Suppl, Ipratropium-Albuterol, amLODipine, antiseptic oral rinse, aspirin EC, buprenorphine, (magic mouthwash (nystatin, lidocaine, diphenhydrAMINE, alum & mag hydroxide) suspension), omeprazole, and rosuvastatin  History Review  Allergy: [name] is allergic to opana [oxymorphone hcl], elemental sulfur, oxymorphone, oxymorphone, sulfa antibiotics, and sulfa antibiotics. Drug: [name]  reports that he does not currently use drugs. Alcohol:  reports that he does not currently use alcohol after a past usage of about 14.0 standard drinks of alcohol per week. Tobacco:   reports that he quit smoking about 8 months ago. His smoking use included cigarettes. He started smoking about 50 years ago. He has a 25 pack-year smoking history. He has been exposed to tobacco smoke. He has never used smokeless tobacco. Social: [name]  reports that he quit smoking about 8 months ago. His smoking use included cigarettes. He started smoking about 50 years ago. He has a 25 pack-year smoking history. He has been exposed to tobacco smoke. He has never used smokeless tobacco. He reports that he does not currently use alcohol after a past usage of about 14.0 standard drinks of alcohol per week. He reports that he does not currently use drugs. Medical:  has a past medical history of Alcohol abuse, Benzodiazepine dependence (HCC), Benzodiazepine withdrawal (HCC), Chronic pain in right foot, COPD (chronic obstructive pulmonary disease) (HCC), Depression (08/19/2017), Hepatitis C, Hypertension, 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HCC), Seizures (HCC) (2010), Sleep apnea, and Syncope. Surgical: [name]  has a past surgical history that includes Colonoscopy with propofol (N/A, 12/16/2016); Colonoscopy with propofol (N/A, 03/14/2017); Myringotomy with tube placement (Left, 04/28/2019); Nasopharyngoscopy (N/A, 04/28/2019); Foot surgery (Right, 03/12/2002); PORTA CATH INSERTION (N/A, 05/20/2019); Lithotripsy; Hemorrhoid surgery; Tonsillectomy; Colonoscopy with propofol; Myringotomy; Nasopharyngoscopy; Foot surgery; Port a cath Placement; and PORTA CATH REMOVAL (N/A, 11/29/2020). Family: family history includes Alcohol abuse in his paternal aunt; Arthritis in his mother; Cancer in his father; Depression in his maternal grandfather; Hypertension in his mother; Kidney disease in his father.  Laboratory Chemistry Profile   Renal Lab Results  Component Value Date   BUN 11 09/25/2022   CREATININE 1.20 09/25/2022   BCR 10 02/26/2018   GFRAA >60 12/11/2019   GFRNONAA >60 09/25/2022    Hepatic Lab Results   Component Value Date   AST 31 09/25/2022   ALT 17 09/25/2022   ALBUMIN 4.5 09/25/2022   ALKPHOS 74 09/25/2022   LIPASE 37 04/06/2022   AMMONIA <10 05/04/2022    Electrolytes  Lab Results  Component Value Date   NA 137 09/25/2022   K 4.1 09/25/2022   CL 99 09/25/2022   CALCIUM 9.3 09/25/2022   MG 1.9 09/25/2022    Bone No results found for: "VD25OH", "VD125OH2TOT", "GN5621HY8", "MV7846NG2", "25OHVITD1", "25OHVITD2", "25OHVITD3", "TESTOFREE", "TESTOSTERONE"  Inflammation (CRP: Acute Phase) (ESR: Chronic Phase) Lab Results  Component Value Date   LATICACIDVEN 1.7 09/25/2022         Note: Above Lab results reviewed.   Physical Exam  General appearance: Well nourished, well developed, and well hydrated. In no apparent acute distress Mental status: Alert, oriented x 3 (person, place, & time)       Respiratory: No evidence of acute respiratory distress Eyes: PERLA Vitals: BP 138/82   Pulse 85   Temp (!) 97.3 F (36.3 C)   Ht 5\' 9"  (1.753 m)   Wt 215 lb (97.5 kg)   BMI 31.75 kg/m  BMI: Estimated body mass index is 31.75 kg/m as calculated from the following:   Height as of this encounter: 5\' 9"  (1.753 m).   Weight as of this encounter: 215 lb (97.5 kg). Ideal: Ideal body weight: 70.7 kg (155 lb 13.8 oz) Adjusted ideal body weight: 81.4 kg (179 lb 8.3 oz)  Lower Extremity Exam      Side: Right lower extremity   Side: Left lower extremity  Stability: No instability observed           Stability: No instability observed          Skin & Extremity Inspection: Evidence of prior arthroplastic surgery, slight edema, allodynia, slight discoloration   Skin & Extremity Inspection: Skin color, temperature, and hair growth are WNL. No peripheral edema or cyanosis. No masses, redness, swelling, asymmetry, or associated skin lesions. No contractures.  Functional ROM: Pain restricted ROM,  improved after LSB            Functional ROM: Unrestricted ROM                  Muscle  Tone/Strength: Functionally intact. No obvious neuro-muscular anomalies detected.   Muscle Tone/Strength: Functionally intact. No obvious neuro-muscular anomalies detected.  Sensory (Neurological): Neurogenic pain pattern , improved after LSB        Sensory (Neurological): Unimpaired        DTR: Patellar: deferred today Achilles: deferred today Plantar: deferred today   DTR: Patellar: deferred today Achilles: deferred today Plantar: deferred today  Palpation: No palpable anomalies   Palpation: No palpable anomalies    Assessment   Diagnosis Status  1. Complex regional pain syndrome type 2 of right lower extremity   2. Chemotherapy-induced peripheral neuropathy (HCC)   3. Chemotherapy induced neutropenia (HCC)   4. Depression, major, single episode, moderate (HCC)   5. Chronic pain syndrome    Controlled Controlled Controlled     Plan of Care  [name] had an excellent response to his right L3 lumbar sympathetic nerve block.  Endorses significant reduction in his right foot pain and right foot range of motion.  Will continue to monitor his symptoms and repeat LSV as needed.  Also discussed spinal cord stimulation as well.  I will also refill his Butrans patch as below, no change in dose.  [name] has a current medication list which includes the following long-term medication(s): amlodipine, combivent respimat, omeprazole, and rosuvastatin.  Pharmacotherapy (Medications Ordered): Meds ordered this encounter  Medications   buprenorphine (BUTRANS) 15 MCG/HR    Sig: Place 1 patch onto  the skin once a week.    Dispense:  4 patch    Refill:  2    Chronic Pain: STOP Act (Not applicable) Fill 1 day early if closed on refill date. Avoid benzodiazepines within 8 hours of opioids   Orders:  Orders Placed This Encounter  Procedures   LUMBAR SYMPATHETIC BLOCK    For sympathetically-mediated lower extremity pain.    Standing Status:   Standing    Number of Occurrences:    4    Standing Expiration Date:   06/14/2023    Scheduling Instructions:     Purpose: Therapuetic     Laterality: RIGHT     Level(s): Lumbar sympathetic chain (L3)     Sedation: Sedation recommended.     Scheduling Timeframe: PRN    Order Specific Question:   Where will this procedure be performed?    Answer:   [facility]   Follow-up plan:   Return in about 18 weeks (around 04/17/2023) for Medication Management, in person.      Right L3 LSB 11/27/22: 75% pain relief    Recent Visits Date Type Provider Dept  11/27/22 Procedure visit Edward Jolly, MD [facility]  Showing recent visits within past 90 days and meeting all other requirements Today's Visits Date Type Provider Dept  12/12/22 Office Visit Edward Jolly, MD [facility]  Showing today's visits and meeting all other requirements Future Appointments No visits were found meeting these conditions. Showing future appointments within next 90 days and meeting all other requirements  I discussed the assessment and treatment plan with the patient. The patient was provided an opportunity to ask questions and all were answered. The patient agreed with the plan and demonstrated an understanding of the instructions.  Patient advised to call back or seek an in-person evaluation if the symptoms or condition worsens.  Duration of encounter: 30 minutes.  Total time on encounter, as per AMA guidelines included both the face-to-face and non-face-to-face time personally spent by the physician and/or other qualified health care professional(s) on the day of the encounter (includes time in activities that require the physician or other qualified health care professional and does not include time in activities normally performed by clinical staff). Physician's time may include the following activities when performed: Preparing to see the patient (e.g., pre-charting review of records, searching for previously ordered  imaging, lab work, and nerve conduction tests) Review of prior analgesic pharmacotherapies. Reviewing PMP Interpreting ordered tests (e.g., lab work, imaging, nerve conduction tests) Performing post-procedure evaluations, including interpretation of diagnostic procedures Obtaining and/or reviewing separately obtained history Performing a medically appropriate examination and/or evaluation Counseling and educating the patient/family/caregiver Ordering medications, tests, or procedures Referring and communicating with other health care professionals (when not separately reported) Documenting clinical information in the electronic or other health record Independently interpreting results (not separately reported) and communicating results to the patient/ family/caregiver Care coordination (not separately reported)  Note by: Edward Jolly, MD Date: 12/12/2022; Time: 10:00 AM

## 2022-12-12 NOTE — Patient Instructions (Addendum)
______________________________________________________________________  Preparing for your procedure  Appointments: If you think you may not be able to keep your appointment, call 24-48 hours in advance to cancel. We need time to make it available to others.  During your procedure appointment there will be: No Prescription Refills. No disability issues to discussed. No medication changes or discussions.  Instructions: Food intake: Avoid eating anything solid for at least 8 hours prior to your procedure. Clear liquid intake: You may take clear liquids such as water up to 2 hours prior to your procedure. (No carbonated drinks. No soda.) Transportation: Unless otherwise stated by your physician, bring a driver. Morning Medicines: Except for blood thinners, take all of your other morning medications with a sip of water. Make sure to take your heart and blood pressure medicines. If your blood pressure's lower number is above 100, the case will be rescheduled. Blood thinners: Make sure to stop your blood thinners as instructed.  If you take a blood thinner, but were not instructed to stop it, call our office [phone] and ask to talk to a nurse. Not stopping a blood thinner prior to certain procedures could lead to serious complications. Diabetics on insulin: Notify the staff so that you can be scheduled 1st case in the morning. If your diabetes requires high dose insulin, take only  of your normal insulin dose the morning of the procedure and notify the staff that you have done so. Preventing infections: Shower with an antibacterial soap the morning of your procedure.  Build-up your immune system: Take 1000 mg of Vitamin C with every meal (3 times a day) the day prior to your procedure. Antibiotics: Inform the nursing staff if you are taking any antibiotics or if you have any conditions that may require antibiotics prior to procedures. (Example: recent joint implants)   Pregnancy: If you are  pregnant make sure to notify the nursing staff. Not doing so may result in injury to the fetus, including death.  Sickness: If you have a cold, fever, or any active infections, call and cancel or reschedule your procedure. Receiving steroids while having an infection may result in complications. Arrival: You must be in the facility at least 30 minutes prior to your scheduled procedure. Tardiness: Your scheduled time is also the cutoff time. If you do not arrive at least 15 minutes prior to your procedure, you will be rescheduled.  Children: Do not bring any children with you. Make arrangements to keep them home. Dress appropriately: There is always a possibility that your clothing may get soiled. Avoid long dresses. Valuables: Do not bring any jewelry or valuables.  Reasons to call and reschedule or cancel your procedure: (Following these recommendations will minimize the risk of a serious complication.) Surgeries: Avoid having procedures within 2 weeks of any surgery. (Avoid for 2 weeks before or after any surgery). Flu Shots: Avoid having procedures within 2 weeks of a flu shots or . (Avoid for 2 weeks before or after immunizations). Barium: Avoid having a procedure within 7-10 days after having had a radiological study involving the use of radiological contrast. (Myelograms, Barium swallow or enema study). Heart attacks: Avoid any elective procedures or surgeries for the initial 6 months after a "Myocardial Infarction" (Heart Attack). Blood thinners: It is imperative that you stop these medications before procedures. Let us know if you if you take any blood thinner.  Infection: Avoid procedures during or within two weeks of an infection (including chest colds or gastrointestinal problems). Symptoms associated with infections  include: Localized redness, fever, chills, night sweats or profuse sweating, burning sensation when voiding, cough, congestion, stuffiness, runny nose, sore throat, diarrhea,  nausea, vomiting, cold or Flu symptoms, recent or current infections. It is specially important if the infection is over the area that we intend to treat. Heart and lung problems: Symptoms that may suggest an active cardiopulmonary problem include: cough, chest pain, breathing difficulties or shortness of breath, dizziness, ankle swelling, uncontrolled high or unusually low blood pressure, and/or palpitations. If you are experiencing any of these symptoms, cancel your procedure and contact your primary care physician for an evaluation.  Remember:  Regular Business hours are:  Monday to Thursday 8:00 AM to 4:00 PM  Provider's Schedule: Delano Metz, MD:  Procedure days: Tuesday and Thursday 7:30 AM to 4:00 PM  Edward Jolly, MD:  Procedure days: Monday and Wednesday 7:30 AM to 4:00 PM  ______________________________________________________________________  Pain Management Discharge Instructions  General Discharge Instructions :  If you need to reach your doctor call: Monday-Friday 8:00 am - 4:00 pm at [phone] or toll free [phone].  After clinic hours [phone] to have operator reach doctor.  Bring all of your medication bottles to all your appointments in the pain clinic.  To cancel or reschedule your appointment with Pain Management please remember to call 24 hours in advance to avoid a fee.  Refer to the educational materials which you have been given on: General Risks, I had my Procedure. Discharge Instructions, Post Sedation.  Post Procedure Instructions:  The drugs you were given will stay in your system until tomorrow, so for the next 24 hours you should not drive, make any legal decisions or drink any alcoholic beverages.  You may eat anything you prefer, but it is better to start with liquids then soups and crackers, and gradually work up to solid foods.  Please notify your doctor immediately if you have any unusual bleeding, trouble breathing or pain that  is not related to your normal pain.  Depending on the type of procedure that was done, some parts of your body may feel week and/or numb.  This usually clears up by tonight or the next day.  Walk with the use of an assistive device or accompanied by an adult for the 24 hours.  You may use ice on the affected area for the first 24 hours.  Put ice in a Ziploc bag and cover with a towel and place against area 15 minutes on 15 minutes off.  You may switch to heat after 24 hours.

## 2022-12-12 NOTE — Addendum Note (Signed)
Addended by: Edward Jolly on: 12/12/2022 10:07 AM   Modules accepted: Orders

## 2022-12-12 NOTE — Progress Notes (Signed)
Safety precautions to be maintained throughout the outpatient stay will include: orient to surroundings, keep bed in low position, maintain call bell within reach at all times, provide assistance with transfer out of bed and ambulation.   Patient did not bring medications with him today.

## 2022-12-18 DIAGNOSIS — J449 Chronic obstructive pulmonary disease, unspecified: Secondary | ICD-10-CM | POA: Diagnosis not present

## 2023-01-18 DIAGNOSIS — J449 Chronic obstructive pulmonary disease, unspecified: Secondary | ICD-10-CM | POA: Diagnosis not present

## 2023-01-23 ENCOUNTER — Ambulatory Visit: Payer: Self-pay | Admitting: *Deleted

## 2023-01-23 NOTE — Telephone Encounter (Signed)
  Chief Complaint: anxiety, trouble sleeping requesting case management for housing issues  Symptoms: recent issue with "bed bugs" and today reports anxiety, overwhelemed worsening due to being quarantined again for bed bugs. Issues sleeping. Concerned medications ruined by recent treatment of bed bugs . Issues with treatment since August 16. Frequency: today  Pertinent Negatives: Patient denies hurting self or others Disposition: [] ED /[] Urgent Care (no appt availability in office) / [x] Appointment(In office/virtual)/ []  [place] [facility]/ [] Home Care/ [] Refused Recommended Disposition /[]  Mobile Bus/ []  Follow-up with PCP Additional Notes:   Appt scheduled for tomorrow.       Reason for Disposition  [1] Anxiety symptoms AND [2] has not been evaluated for this by doctor (or NP/PA)  Answer Assessment - Initial Assessment Questions 1. CONCERN: "Did anything happen that prompted you to call today?"      Continued with "bed bugs" and very anxious  2. ANXIETY SYMPTOMS: "Can you describe how you (your loved one; patient) have been feeling?" (e.g., tense, restless, panicky, anxious, keyed up, overwhelmed, sense of impending doom).      Overwhelmed with being accused of not following instructions to get rid of bed bugs 3. ONSET: "How long have you been feeling this way?" (e.g., hours, days, weeks)     Since August 16 and no treatment until sept 4 and now needs to be in quarantine  4. SEVERITY: "How would you rate the level of anxiety?" (e.g., 0 - 10; or mild, moderate, severe).     na 5. FUNCTIONAL IMPAIRMENT: "How have these feelings affected your ability to do daily activities?" "Have you had more difficulty than usual doing your normal daily activities?" (e.g., getting better, same, worse; self-care, school, work, interactions)     Na  6. HISTORY: "Have you felt this way before?" "Have you ever been diagnosed with an anxiety problem in the past?" (e.g., generalized anxiety  disorder, panic attacks, PTSD). If Yes, ask: "How was this problem treated?" (e.g., medicines, counseling, etc.)     na 7. RISK OF HARM - SUICIDAL IDEATION: "Do you ever have thoughts of hurting or killing yourself?" If Yes, ask:  "Do you have these feelings now?" "Do you have a plan on how you would do this?"     Denies  8. TREATMENT:  "What has been done so far to treat this anxiety?" (e.g., medicines, relaxation strategies). "What has helped?"     na 9. TREATMENT - THERAPIST: "Do you have a counselor or therapist? Name?"     na 10. POTENTIAL TRIGGERS: "Do you drink caffeinated beverages (e.g., coffee, colas, teas), and how much daily?" "Do you drink alcohol or use any drugs?" "Have you started any new medicines recently?"       na 11. PATIENT SUPPORT: "Who is with you now?" "Who do you live with?" "Do you have family or friends who you can talk to?"        No support at this time  84. OTHER SYMPTOMS: "Do you have any other symptoms?" (e.g., feeling depressed, trouble concentrating, trouble sleeping, trouble breathing, palpitations or fast heartbeat, chest pain, sweating, nausea, or diarrhea)       Trouble sleeping . Requesting case management for housing 13. PREGNANCY: "Is there any chance you are pregnant?" "When was your last menstrual period?"       na  Protocols used: Anxiety and Panic Attack-A-AH

## 2023-01-24 ENCOUNTER — Ambulatory Visit (INDEPENDENT_AMBULATORY_CARE_PROVIDER_SITE_OTHER): Payer: Medicare HMO | Admitting: Family Medicine

## 2023-01-24 ENCOUNTER — Encounter: Payer: Self-pay | Admitting: *Deleted

## 2023-01-24 VITALS — BP 130/78 | HR 98 | Temp 98.4°F | Ht 69.0 in | Wt 221.0 lb

## 2023-01-24 DIAGNOSIS — F5102 Adjustment insomnia: Secondary | ICD-10-CM

## 2023-01-24 DIAGNOSIS — F39 Unspecified mood [affective] disorder: Secondary | ICD-10-CM | POA: Diagnosis not present

## 2023-01-24 DIAGNOSIS — F17200 Nicotine dependence, unspecified, uncomplicated: Secondary | ICD-10-CM | POA: Diagnosis not present

## 2023-01-24 DIAGNOSIS — B888 Other specified infestations: Secondary | ICD-10-CM

## 2023-01-24 MED ORDER — ROSUVASTATIN CALCIUM 40 MG PO TABS: 40.0000 mg | ORAL_TABLET | Freq: Every day | ORAL | 3 refills | Status: DC

## 2023-01-24 MED ORDER — QUETIAPINE FUMARATE 25 MG PO TABS: 25.0000 mg | ORAL_TABLET | Freq: Every day | ORAL | 2 refills | Status: DC

## 2023-01-24 MED ORDER — AMLODIPINE BESYLATE 10 MG PO TABS: 10.0000 mg | ORAL_TABLET | Freq: Every day | ORAL | 3 refills | Status: DC

## 2023-01-24 NOTE — Progress Notes (Unsigned)
Established patient visit   Patient: [name]   DOB: 07-26-1950   72 y.o. Male  MRN: [id number] Visit Date: 01/24/2023  Today's healthcare provider: [name], FNP  Introduced to nurse practitioner role and practice setting.  All questions answered.  Discussed provider/patient relationship and expectations.  Subjective    Anxiety     HPI     Anxiety    Additional comments: Increased stress causing increased anxiety.  Patient is having difficulty with his housing.  He is being told that they want him out of his apartment due to bed bugs.  He states they tried kicking him out in the past.  He wanted documentation to reflec how this is causing increased anxiety.   He also states he was told on the phone that maybe he could be referred for counseling and patient would like to have that done.       Last edited by Adline Peals, CMA on 01/24/2023  1:36 PM.     8/16 reported; treated on 9/4; reports quarantine for 10 days- due for upcoming follow up by exterminator treatment on 9/17  Medications: Outpatient Medications Prior to Visit  Medication Sig   antiseptic oral rinse (BIOTENE) LIQD 15 mLs by Mouth Rinse route as needed for dry mouth.   aspirin EC 81 MG tablet Take 81 mg by mouth daily. Swallow whole.   Blood Glucose Monitoring Suppl DEVI 1 each by Does not apply route in the morning, at noon, and at bedtime. May substitute to any manufacturer covered by patient's insurance. Pt's current glucometer is broken.   [START ON 01/26/2023] buprenorphine (BUTRANS) 15 MCG/HR Place 1 patch onto the skin once a week.   Ipratropium-Albuterol (COMBIVENT RESPIMAT) 20-100 MCG/ACT AERS respimat Inhale 1 puff into the lungs every 6 (six) hours as needed. Rescue inhaler   magic mouthwash (nystatin, lidocaine, diphenhydrAMINE, alum & mag hydroxide) suspension Swish and spit 5 mLs 3 (three) times daily as needed for mouth pain.   omeprazole (PRILOSEC) 40 MG capsule TAKE 1  CAPSULE BY MOUTH ONCE DAILY   rosuvastatin (CRESTOR) 5 MG tablet TAKE 1 TABLET BY MOUTH DAILY. (Patient taking differently: Take 5 mg by mouth daily.)   [DISCONTINUED] amLODipine (NORVASC) 5 MG tablet Take 1 tablet (5 mg total) by mouth daily.   Facility-Administered Medications Prior to Visit  Medication Dose Route Frequency Provider   heparin lock flush 100 unit/mL  500 Units Intravenous Once Creig Hines, MD     Objective    BP 130/78 (BP Location: Right Arm, Patient Position: Sitting, Cuff Size: Large)   Pulse 98   Temp 98.4 F (36.9 C) (Oral)   Ht 5\' 9"  (1.753 m)   Wt 221 lb (100.2 kg)   SpO2 98%   BMI 32.64 kg/m   Physical Exam Vitals and nursing note reviewed.  Constitutional:      Appearance: Normal appearance. He is normal weight.  HENT:     Head: Normocephalic and atraumatic.  Cardiovascular:     Rate and Rhythm: Normal rate and regular rhythm.     Pulses: Normal pulses.     Heart sounds: Normal heart sounds.  Pulmonary:     Effort: Pulmonary effort is normal.     Breath sounds: Normal breath sounds.  Musculoskeletal:        General: Normal range of motion.     Cervical back: Normal range of motion.  Skin:    General: Skin is warm and dry.  Capillary Refill: Capillary refill takes less than 2 seconds.  Neurological:     General: No focal deficit present.     Mental Status: He is alert and oriented to person, place, and time. Mental status is at baseline.  Psychiatric:        Mood and Affect: Mood normal.        Behavior: Behavior normal.        Thought Content: Thought content normal.        Judgment: Judgment normal.      No results found for any visits on 01/24/23.  Assessment & Plan     Problem List Items Addressed This Visit   None Visit Diagnoses     Mood disorder [facility])    -  Primary   Relevant Orders   Ambulatory referral to Psychiatry   Ambulatory referral to Psychology   Adjustment insomnia       Relevant Orders   Ambulatory  referral to Psychiatry   Ambulatory referral to Psychology       No follow-ups on file.      Leilani Merl, FNP, have reviewed all documentation for this visit. The documentation on 01/24/23 for the exam, diagnosis, procedures, and orders are all accurate and complete.  [name], FNP  [facility] [phone] (phone) [phone] (fax)  [facility]

## 2023-01-24 NOTE — Patient Outreach (Signed)
Care Coordination   Collaboration  Visit Note   01/24/2023 Name: [name] MRN: [id number] DOB: [birth year]  [name] is a 72 y.o. year old male who sees [name], FNP for primary care.   What matters to the patients health and wellness today?  Housing Needs    Goals Addressed             This Visit's Progress    care coordination activities       Interventions Today    Flowsheet Row Most Recent Value  Chronic Disease   Chronic disease during today's visit Other  [anxiety]  General Interventions   General Interventions Discussed/Reviewed Communication with  Communication with PCP/Specialists  [Pt requesting case management assistance due to issues with housing, bed bug infestation possible mold exposure-may be facing eviction pt to be contacted for assistance needs complex management to be contacted with pt  consent]              SDOH assessments and interventions completed:  No     Care Coordination Interventions:  Yes, provided   Follow up plan: Follow up call scheduled for 01/27/23    Encounter Outcome:  Patient Visit Completed

## 2023-01-25 LAB — CBC WITH DIFFERENTIAL/PLATELET
Basophils Absolute: 0 10*3/uL (ref 0.0–0.2)
Basos: 1 %
EOS (ABSOLUTE): 0.2 10*3/uL (ref 0.0–0.4)
Eos: 3 %
Hematocrit: 42.2 % (ref 37.5–51.0)
Hemoglobin: 13.8 g/dL (ref 13.0–17.7)
Immature Grans (Abs): 0 10*3/uL (ref 0.0–0.1)
Immature Granulocytes: 0 %
Lymphocytes Absolute: 1.3 10*3/uL (ref 0.7–3.1)
Lymphs: 22 %
MCH: 31.6 pg (ref 26.6–33.0)
MCHC: 32.7 g/dL (ref 31.5–35.7)
MCV: 97 fL (ref 79–97)
Monocytes Absolute: 0.6 10*3/uL (ref 0.1–0.9)
Monocytes: 10 %
Neutrophils Absolute: 3.8 10*3/uL (ref 1.4–7.0)
Neutrophils: 64 %
Platelets: 176 10*3/uL (ref 150–450)
RBC: 4.37 x10E6/uL (ref 4.14–5.80)
RDW: 14 % (ref 11.6–15.4)
WBC: 5.9 10*3/uL (ref 3.4–10.8)

## 2023-01-25 LAB — COMPREHENSIVE METABOLIC PANEL
ALT: 35 IU/L (ref 0–44)
AST: 44 IU/L — ABNORMAL HIGH (ref 0–40)
Albumin: 4.6 g/dL (ref 3.9–4.9)
Alkaline Phosphatase: 71 IU/L (ref 44–121)
BUN/Creatinine Ratio: 10 (ref 10–24)
BUN: 10 mg/dL (ref 8–27)
Bilirubin Total: 0.4 mg/dL (ref 0.0–1.2)
CO2: 24 mmol/L (ref 20–29)
Calcium: 9.6 mg/dL (ref 8.6–10.2)
Chloride: 95 mmol/L — ABNORMAL LOW (ref 96–106)
Creatinine, Ser: 0.98 mg/dL (ref 0.76–1.27)
Globulin, Total: 2.6 g/dL (ref 1.5–4.5)
Glucose: 105 mg/dL — ABNORMAL HIGH (ref 70–99)
Potassium: 4.5 mmol/L (ref 3.5–5.2)
Sodium: 137 mmol/L (ref 134–144)
Total Protein: 7.2 g/dL (ref 6.0–8.5)
eGFR: 83 mL/min/{1.73_m2} (ref >59)

## 2023-01-25 LAB — LIPID PANEL
Chol/HDL Ratio: 3.1 ratio (ref 0.0–5.0)
Cholesterol, Total: 163 mg/dL (ref 100–199)
HDL: 52 mg/dL (ref >39)
LDL Chol Calc (NIH): 86 mg/dL (ref 0–99)
Triglycerides: 146 mg/dL (ref 0–149)
VLDL Cholesterol Cal: 25 mg/dL (ref 5–40)

## 2023-01-25 LAB — HEMOGLOBIN A1C
Est. average glucose Bld gHb Est-mCnc: 134 mg/dL
Hgb A1c MFr Bld: 6.3 % — ABNORMAL HIGH (ref 4.8–5.6)

## 2023-01-25 LAB — PSA: Prostate Specific Ag, Serum: 0.5 ng/mL (ref 0.0–4.0)

## 2023-01-25 LAB — TSH: TSH: 6.13 u[IU]/mL — ABNORMAL HIGH (ref 0.450–4.500)

## 2023-01-26 ENCOUNTER — Encounter: Payer: Self-pay | Admitting: Family Medicine

## 2023-01-26 DIAGNOSIS — B888 Other specified infestations: Secondary | ICD-10-CM | POA: Insufficient documentation

## 2023-01-26 DIAGNOSIS — F39 Unspecified mood [affective] disorder: Secondary | ICD-10-CM | POA: Insufficient documentation

## 2023-01-26 DIAGNOSIS — F5102 Adjustment insomnia: Secondary | ICD-10-CM | POA: Insufficient documentation

## 2023-01-26 NOTE — Assessment & Plan Note (Signed)
Chronic, worsening Referral to psych to assist Trial of seroquel 25 mg to assist with insomnia r/t stress

## 2023-01-26 NOTE — Assessment & Plan Note (Signed)
Chronic, historical Due for low dose lung CT

## 2023-01-26 NOTE — Assessment & Plan Note (Signed)
Acute on chronic, trial of seroquel to assist current situation while we wait on pysch referrals and counseling services

## 2023-01-26 NOTE — Assessment & Plan Note (Signed)
Acute on chronic, reports he is in quarantine at this appt and has a follow up appt with exterminator Reports stress over his living situation CM and SW contacted for additional support given fear for eviction; which patient feels is unlawful

## 2023-01-27 ENCOUNTER — Telehealth: Payer: Self-pay | Admitting: *Deleted

## 2023-01-27 ENCOUNTER — Encounter: Payer: Self-pay | Admitting: *Deleted

## 2023-01-27 ENCOUNTER — Other Ambulatory Visit: Payer: Self-pay

## 2023-01-27 NOTE — Patient Outreach (Signed)
Care Management   Visit Note  01/27/2023 Name: BRIANA WINGERT MRN: [id number] DOB: [birth year]  Subjective: [name] is a 72 y.o. year old male who is a primary care patient of [name], FNP. The Care Management team was consulted for assistance.      Brief outreach with Mr. Walla. Reports just returning home at the time of the call. Prefers to complete outreach on 01/30/23.  PLAN Appointment scheduled for 01/30/23.    Katina [facility]  [facility], [facility] RN Care Manager Direct Dial: [phone] Website: [email or web address]

## 2023-01-27 NOTE — Telephone Encounter (Signed)
Pt given lab results per notes of E. Suzie Portela, FNP from 01/25/23 on 01/27/23. Pt verbalized understanding. Reviewed with patient that he missed Telephone appt with CM at 0900. Patient reports he missed call and was asleep. Reviewed importance of keeping appt due to nature of call for assistance with housing issues. Patient requesting if another call can be scheduled. Please advise.

## 2023-01-27 NOTE — Patient Outreach (Signed)
Care Coordination   01/27/2023 Name: [name] MRN: [id number] DOB: 12-31-1950   Care Coordination Outreach Attempts:  An unsuccessful telephone outreach was attempted today to offer the patient information about available care coordination services.  Follow Up Plan:  Additional outreach attempts will be made to offer the patient care coordination information and services.   Encounter Outcome:  No Answer   Care Coordination Interventions:  No, not indicated    Tameem Pullara, LCSW [place]  [facility], [facility] Licensed Clinical Social Worker Care Coordinator  Direct Dial: [phone]

## 2023-01-28 ENCOUNTER — Ambulatory Visit: Payer: Self-pay | Admitting: *Deleted

## 2023-01-28 NOTE — Patient Outreach (Signed)
Care Coordination   01/28/2023 Name: [name] MRN: [id number] DOB: 01/28/1951   Care Coordination Outreach Attempts:  A second unsuccessful outreach was attempted today to offer the patient with information about available care coordination services.  Follow Up Plan:  Additional outreach attempts will be made to offer the patient care coordination information and services.   Encounter Outcome:  No Answer   Care Coordination Interventions:  No, not indicated     Yoceline Bazar, LCSW [place]  [facility], [facility] Licensed Clinical Social Worker Care Coordinator  Direct Dial: [phone]

## 2023-01-29 ENCOUNTER — Telehealth: Payer: Self-pay | Admitting: *Deleted

## 2023-01-29 NOTE — Patient Outreach (Signed)
Care Coordination   Initial Visit Note   01/29/2023 Name: [name] MRN: [id number] DOB: 1950-07-27  [name] is a 72 y.o. year old male who sees [name], FNP for primary care. I spoke with  [name] by phone today.  What matters to the patients health and wellness today?  Patient discussed concerns related to his housing and barriers related to pest control.   Goals Addressed             This Visit's Progress    care coordination activities       Interventions Today    Flowsheet Row Most Recent Value  Chronic Disease   Chronic disease during today's visit Hypertension (HTN)  General Interventions   General Interventions Discussed/Reviewed General Interventions Discussed, [employer] that apt was treated for bed bugs on 01/15/23-re-checked on 01/23/23-bed bugs remain-rpt to head of housing-plan to spray again yesterday but has been re-scheduled for 9/18- heat treatment 10 days later-risk eviction if apt does not pass insp.]  Mental Health Interventions   Mental Health Discussed/Reviewed Mental Health Discussed, Anxiety, Coping Strategies  [confirmed symptoms of anxiety related to current circumstances, encouraged expression of feeling, provided patient with emotional support, self advocacy encouraged discussed follow up with legal aid, field office for HUD and disabiity advocacy center]  Safety Interventions   Safety Discussed/Reviewed Safety Discussed              SDOH assessments and interventions completed:  Yes  SDOH Interventions Today    Flowsheet Row Most Recent Value  SDOH Interventions   Food Insecurity Interventions Intervention Not Indicated  Housing Interventions Other (Comment)  [patient lives in [facility], will be treated for bed bugs on 01/30/23]  Transportation Interventions Intervention Not Indicated  Utilities Interventions Intervention Not Indicated  Stress Interventions Other  (Comment)        Care Coordination Interventions:  Yes, provided   Follow up plan: Follow up call scheduled for 01/30/23    Encounter Outcome:  Patient Visit Completed

## 2023-01-29 NOTE — Patient Instructions (Signed)
Visit Information  Thank you for taking time to visit with me today. Please don't hesitate to contact me if I can be of assistance to you.   Following are the goals we discussed today:  Please contact Legal Aid [phone] and the Disabilities Advocacy Program [phone] to assist with barriers related to pest control   Our next appointment is by telephone on 01/30/23 at 3:30pm   Please call the care guide team at [phone] if you need to cancel or reschedule your appointment.   If you are experiencing a Mental Health or Behavioral Health Crisis or need someone to talk to, please call 911   Patient verbalizes understanding of instructions and care plan provided today and agrees to view in MyChart. Active MyChart status and patient understanding of how to access instructions and care plan via MyChart confirmed with patient.     Telephone follow up appointment with care management team member scheduled for: 01/30/23  Verna Czech, LCSW [place]  [facility], [facility] Licensed Clinical Social Worker Care Coordinator  Direct Dial: [phone]

## 2023-01-30 ENCOUNTER — Encounter: Payer: Self-pay | Admitting: *Deleted

## 2023-01-30 ENCOUNTER — Other Ambulatory Visit: Payer: Medicare HMO

## 2023-01-30 ENCOUNTER — Emergency Department: Payer: Medicare HMO

## 2023-01-30 ENCOUNTER — Other Ambulatory Visit: Payer: Self-pay

## 2023-01-30 ENCOUNTER — Ambulatory Visit: Payer: Self-pay

## 2023-01-30 ENCOUNTER — Emergency Department
Admission: EM | Admit: 2023-01-30 | Discharge: 2023-01-30 | Disposition: A | Discharge status: Home / Self Care | Payer: Medicare HMO | Attending: Emergency Medicine | Admitting: Emergency Medicine

## 2023-01-30 ENCOUNTER — Encounter: Payer: Self-pay | Admitting: Emergency Medicine

## 2023-01-30 DIAGNOSIS — K828 Other specified diseases of gallbladder: Secondary | ICD-10-CM | POA: Diagnosis not present

## 2023-01-30 DIAGNOSIS — K8070 Calculus of gallbladder and bile duct without cholecystitis without obstruction: Secondary | ICD-10-CM | POA: Insufficient documentation

## 2023-01-30 DIAGNOSIS — K8021 Calculus of gallbladder without cholecystitis with obstruction: Secondary | ICD-10-CM | POA: Diagnosis not present

## 2023-01-30 DIAGNOSIS — K8050 Calculus of bile duct without cholangitis or cholecystitis without obstruction: Secondary | ICD-10-CM

## 2023-01-30 DIAGNOSIS — R1011 Right upper quadrant pain: Secondary | ICD-10-CM | POA: Diagnosis not present

## 2023-01-30 DIAGNOSIS — K760 Fatty (change of) liver, not elsewhere classified: Secondary | ICD-10-CM | POA: Diagnosis not present

## 2023-01-30 DIAGNOSIS — R1013 Epigastric pain: Secondary | ICD-10-CM

## 2023-01-30 DIAGNOSIS — K8020 Calculus of gallbladder without cholecystitis without obstruction: Secondary | ICD-10-CM | POA: Diagnosis not present

## 2023-01-30 DIAGNOSIS — R000 Tachycardia, unspecified: Secondary | ICD-10-CM | POA: Insufficient documentation

## 2023-01-30 DIAGNOSIS — R0789 Other chest pain: Secondary | ICD-10-CM | POA: Diagnosis not present

## 2023-01-30 DIAGNOSIS — R072 Precordial pain: Secondary | ICD-10-CM | POA: Diagnosis not present

## 2023-01-30 DIAGNOSIS — Z743 Need for continuous supervision: Secondary | ICD-10-CM | POA: Diagnosis not present

## 2023-01-30 LAB — COMPREHENSIVE METABOLIC PANEL
ALT: 30 U/L (ref 0–44)
AST: 41 U/L (ref 15–41)
Albumin: 4 g/dL (ref 3.5–5.0)
Alkaline Phosphatase: 56 U/L (ref 38–126)
Anion gap: 11 (ref 5–15)
BUN: 11 mg/dL (ref 8–23)
CO2: 27 mmol/L (ref 22–32)
Calcium: 8.6 mg/dL — ABNORMAL LOW (ref 8.9–10.3)
Chloride: 97 mmol/L — ABNORMAL LOW (ref 98–111)
Creatinine, Ser: 1.17 mg/dL (ref 0.61–1.24)
GFR, Estimated: >60 mL/min (ref >60)
Glucose, Bld: 178 mg/dL — ABNORMAL HIGH (ref 70–99)
Potassium: 4 mmol/L (ref 3.5–5.1)
Sodium: 135 mmol/L (ref 135–145)
Total Bilirubin: 0.8 mg/dL (ref 0.3–1.2)
Total Protein: 7.7 g/dL (ref 6.5–8.1)

## 2023-01-30 LAB — CBC WITH DIFFERENTIAL/PLATELET
Abs Immature Granulocytes: 0.02 10*3/uL (ref 0.00–0.07)
Basophils Absolute: 0 10*3/uL (ref 0.0–0.1)
Basophils Relative: 0 %
Eosinophils Absolute: 0.2 10*3/uL (ref 0.0–0.5)
Eosinophils Relative: 2 %
HCT: 39.1 % (ref 39.0–52.0)
Hemoglobin: 13.1 g/dL (ref 13.0–17.0)
Immature Granulocytes: 0 %
Lymphocytes Relative: 9 %
Lymphs Abs: 0.8 10*3/uL (ref 0.7–4.0)
MCH: 31.7 pg (ref 26.0–34.0)
MCHC: 33.5 g/dL (ref 30.0–36.0)
MCV: 94.7 fL (ref 80.0–100.0)
Monocytes Absolute: 0.5 10*3/uL (ref 0.1–1.0)
Monocytes Relative: 5 %
Neutro Abs: 6.9 10*3/uL (ref 1.7–7.7)
Neutrophils Relative %: 84 %
Platelets: 142 10*3/uL — ABNORMAL LOW (ref 150–400)
RBC: 4.13 MIL/uL — ABNORMAL LOW (ref 4.22–5.81)
RDW: 14.4 % (ref 11.5–15.5)
WBC: 8.4 10*3/uL (ref 4.0–10.5)
nRBC: 0 % (ref 0.0–0.2)

## 2023-01-30 LAB — LIPASE, BLOOD: Lipase: 51 U/L (ref 11–51)

## 2023-01-30 LAB — TROPONIN I (HIGH SENSITIVITY)
Troponin I (High Sensitivity): 8 ng/L (ref <18)
Troponin I (High Sensitivity): 6 ng/L (ref <18)

## 2023-01-30 MED ORDER — ONDANSETRON HCL 4 MG/2ML IJ SOLN
4.0000 mg | INTRAMUSCULAR | Status: AC
  Administered 2023-01-30: 4 mg via INTRAVENOUS
  Filled 2023-01-30: qty 2

## 2023-01-30 MED ORDER — LACTATED RINGERS IV BOLUS
1000.0000 mL | Freq: Once | INTRAVENOUS | Status: AC
  Administered 2023-01-30: 1000 mL via INTRAVENOUS

## 2023-01-30 MED ORDER — MORPHINE SULFATE (PF) 4 MG/ML IV SOLN
4.0000 mg | Freq: Once | INTRAVENOUS | Status: AC
  Administered 2023-01-30: 4 mg via INTRAVENOUS
  Filled 2023-01-30: qty 1

## 2023-01-30 MED ORDER — KETOROLAC TROMETHAMINE 30 MG/ML IJ SOLN
15.0000 mg | Freq: Once | INTRAMUSCULAR | Status: AC
  Administered 2023-01-30: 15 mg via INTRAVENOUS
  Filled 2023-01-30: qty 1

## 2023-01-30 NOTE — Telephone Encounter (Signed)
  Chief Complaint: Abdominal Pain Symptoms: Pain 6/10 Frequency: last night - seen at ED and today Pertinent Negatives: Patient denies  Disposition: [x] ED /[] Urgent Care (no appt availability in office) / [] Appointment(In office/virtual)/ []  [place] [facility]/ [] Home Care/ [] Refused Recommended Disposition /[] [place] [facility]/ []  Follow-up with PCP Additional Notes: Pt was seen at ED last night for abdominal pain. Pt was released this morning w/o any pain. Pain has now returned [ain is 6/10. Pt is seeking pain medication. Pt is requesting pain medication from PCP. Pt advised that ED would be best option for stronger pain medication. No appts in office   Summary: Seeking pain medicine following ED visit   Pt called reporting that he was seen in the ED last night but he was sent home without anything to treat his pain  [phone]     Reason for Disposition  [1] SEVERE pain AND [2] age > 60 years  Answer Assessment - Initial Assessment Questions 1. LOCATION: "Where does it hurt?"      abdomen 2. RADIATION: "Does the pain shoot anywhere else?" (e.g., chest, back)     no 3. ONSET: "When did the pain begin?" (Minutes, hours or days ago)      2 days ago 4. SUDDEN: "Gradual or sudden onset?"     sudden 5. PATTERN "Does the pain come and go, or is it constant?"    - If it comes and goes: "How long does it last?" "Do you have pain now?"     (Note: Comes and goes means the pain is intermittent. It goes away completely between bouts.)    - If constant: "Is it getting better, staying the same, or getting worse?"      (Note: Constant means the pain never goes away completely; most serious pain is constant and gets worse.)      Comes and goes 6. SEVERITY: "How bad is the pain?"  (e.g., Scale 1-10; mild, moderate, or severe)    - MILD (1-3): Doesn't interfere with normal activities, abdomen soft and not tender to touch.     - MODERATE (4-7): Interferes with normal activities or  awakens from sleep, abdomen tender to touch.     - SEVERE (8-10): Excruciating pain, doubled over, unable to do any normal activities.       moderate 7. RECURRENT SYMPTOM: "Have you ever had this type of stomach pain before?" If Yes, ask: "When was the last time?" and "What happened that time?"      Last night 8. CAUSE: "What do you think is causing the stomach pain?"     unsure 9. RELIEVING/AGGRAVATING FACTORS: "What makes it better or worse?" (e.g., antacids, bending or twisting motion, bowel movement)     unsure 10. OTHER SYMPTOMS: "Do you have any other symptoms?" (e.g., back pain, diarrhea, fever, urination pain, vomiting)       Nausea  Protocols used: Abdominal Pain - Male-A-AH

## 2023-01-30 NOTE — ED Provider Notes (Signed)
[facility] Provider Note    Event Date/Time   First MD Initiated Contact with Patient 01/30/23 [phone]     (approximate)   History   Chest Pain   HPI RHOEN LANGILL IV is a 72 y.o. male who presents by EMS from an apartment where he lives and which is apparently infested with bedbugs.  He is complaining of substernal or upper abdominal pain that radiates occasionally into his left arm.  It has been going on for 10 to 14 hours.  He received a full dose aspirin en route to the hospital.  He is not having any shortness of breath.  The patient said that the symptoms started rather abruptly and he is not certain if he ate immediately before the pain started.  He said that he thinks he has been told in the past that he has gallstones but he cannot remember for sure.  He drinks alcohol only rarely and has had none recently.  He denies shortness of breath.  He has had some nausea.  No lower abdominal pain.  No dysuria.  No flank pain.     Physical Exam   Triage Vital Signs: ED Triage Vitals  Encounter Vitals Group     BP 01/30/23 0200 (!) 147/82     Systolic BP Percentile --      Diastolic BP Percentile --      Pulse Rate 01/30/23 0200 71     Resp 01/30/23 0200 15     Temp --      Temp src --      SpO2 --      Weight 01/30/23 0058 100.2 kg (220 lb 14.4 oz)     Height 01/30/23 0058 1.753 m (5\' 9" )     Head Circumference --      Peak Flow --      Pain Score 01/30/23 0058 7     Pain Loc --      Pain Education --      Exclude from Growth Chart --     Most recent vital signs: Vitals:   01/30/23 0745 01/30/23 0757  BP: (!) 160/72   Pulse:    Resp:    Temp:  97.9 F (36.6 C)  SpO2: 96%     General: Awake, initially uncomfortable upon arrival but more comfortable after medication. CV:  Good peripheral perfusion.  Regular rate and rhythm, normal heart sounds. Resp:  Normal effort. Speaking easily and comfortably, no accessory muscle usage nor  intercostal retractions.  Lungs clear to auscultation. Abd:  No distention.  Soft but with tenderness to palpation of the epigastrium and right upper quadrant with equivocal Murphy sign.  Not peritoneal.  ED Results / Procedures / Treatments   Labs (all labs ordered are listed, but only abnormal results are displayed) Labs Reviewed  CBC WITH DIFFERENTIAL/PLATELET - Abnormal; Notable for the following components:      Result Value   RBC 4.13 (*)    Platelets 142 (*)    All other components within normal limits  COMPREHENSIVE METABOLIC PANEL - Abnormal; Notable for the following components:   Chloride 97 (*)    Glucose, Bld 178 (*)    Calcium 8.6 (*)    All other components within normal limits  LIPASE, BLOOD  TROPONIN I (HIGH SENSITIVITY)  TROPONIN I (HIGH SENSITIVITY)     EKG  ED ECG REPORT I, Loleta Rose, the attending physician, personally viewed and interpreted this ECG.  Date: 01/30/2023 EKG  Time: 1:25 AM Rate: 90 Rhythm: normal sinus rhythm QRS Axis: normal Intervals: Left anterior fascicular block ST/T Wave abnormalities: Non-specific ST segment / T-wave changes, but no clear evidence of acute ischemia. Narrative Interpretation: no definitive evidence of acute ischemia; does not meet STEMI criteria.    RADIOLOGY I viewed and interpreted the patient's ultrasound.  See hospital course for details.   PROCEDURES:  Critical Care performed: No  .1-3 Lead EKG Interpretation  Performed by: Loleta Rose, MD Authorized by: Loleta Rose, MD     Interpretation: abnormal     ECG rate:  120   ECG rate assessment: tachycardic     Rhythm: sinus tachycardia     Ectopy: none       IMPRESSION / MDM / ASSESSMENT AND PLAN / ED COURSE  I reviewed the triage vital signs and the nursing notes.                              Differential diagnosis includes, but is not limited to, biliary colic/gallbladder disease, pancreatitis, ACS, PE, pneumonia,  pneumothorax.  Patient's presentation is most consistent with acute presentation with potential threat to life or bodily function.  Labs/studies ordered: EKG, CBC with differential, high-sensitivity troponin, lipase, comprehensive metabolic panel, right upper quadrant ultrasound  Interventions/Medications given:  LR 1 L IV bolus, 4 mg IV Zofran, morphine 4 mg IV, ketorolac 15 mg IV (Note:  hospital course my include additional interventions and/or labs/studies not listed above.)   Patient is low risk for ACS based on HEAR score.Marland Kitchen  He has a well score for PE of 1.5 due to tachycardia, but his pain is very reproducible with palpation of the epigastrium.  Medications as listed above and I will evaluate with an ultrasound.  I think is most likely he is having biliary colic than an acute cardiac or pulmonary event.  Patient was feeling better after the medications and agrees with the plan for the ultra  Of note, when I reassessed him after the medication and before ultrasound, he said his pain was completely gone and has stayed away this time.  The patient is on the cardiac monitor to evaluate for evidence of arrhythmia and/or significant heart rate changes.   Clinical Course as of 01/30/23 1132  Thu Jan 30, 2023  0701 US ABDOMEN LIMITED RUQ (LIVER/GB) I viewed and interpreted the patient's abdominal ultrasound.  He has cholelithiasis but I see no sonographic evidence of cholecystitis.  Radiologist report confirms these initial findings.  I reassessed the patient.  While he was asleep, his heart rate was in the 70s after just a small amount of fluids which is reassuring.  After he started tach to me his heart rate went back up and I think that his tachycardia observe earlier is likely the result primarily of being anxious about being here.  However, he reports that his pain has resolved.  We talked about the diagnosis of biliary colic/cholelithiasis and he will follow-up as an outpatient.  I  gave him my usual and customary management recommendations and return precautions as well as follow-up information with general surgery.  He agrees with the plan.   [CF]    Clinical Course User Index [CF] Loleta Rose, MD     FINAL CLINICAL IMPRESSION(S) / ED DIAGNOSES   Final diagnoses:  Epigastric pain  Biliary colic  Calculus of gallbladder without cholecystitis without obstruction     Rx / DC Orders   ED  Discharge Orders     None        Note:  This document was prepared using Dragon voice recognition software and may include unintentional dictation errors.   Loleta Rose, MD 01/30/23 [phone]

## 2023-01-30 NOTE — Patient Outreach (Signed)
Care Coordination   Follow Up Visit Note   01/30/2023 Name: [name] MRN: [id number] DOB: 1950/11/28  [name] is a 72 y.o. year old male who sees [name], FNP for primary care. I spoke with  [name] by phone today.  What matters to the patients health and wellness today?  Community resources available to assist with pest control in his home    Goals Addressed             This Visit's Progress    care coordination activities       Interventions Today    Flowsheet Row Most Recent Value  Chronic Disease   Chronic disease during today's visit Hypertension (HTN)  General Interventions   General Interventions Discussed/Reviewed General Interventions Reviewed, [employer], Communication with  [Pt states being seen in the ED for abdominal pain. Patient resting at home now, confirms that pest control company did come and spray-will need to wait 7-10 days to complete the heat treatment-declined need for CSW to intervene with mgmt co. at this time]  Communication with RN  [discussed patient's current living conditions with RN and anticipated plan to assist, confirmed that community resources have beem provided]              SDOH assessments and interventions completed:  No     Care Coordination Interventions:  Yes, provided   Follow up plan: Follow up call scheduled for 02/03/23    Encounter Outcome:  Patient Visit Completed

## 2023-01-30 NOTE — Patient Outreach (Signed)
Care Coordination   Multidisciplinary Case Review Note    01/30/2023 Name: LONDYN CODAY MRN: [id number] DOB: 1951-03-03  [name] is a 72 y.o. year old male who sees [name], FNP for primary care.  The  multidisciplinary care team met today to review patient care needs and barriers.     Goals Addressed             This Visit's Progress    care coordinaion activities-Multi-discplinary team meeting       Interventions Today    Flowsheet Row Most Recent Value  General Interventions   General Interventions Discussed/Reviewed Communication with  Communication with --  [multi-discplinary team meeting to discuss patient's current status and community resource needs]              SDOH assessments and interventions completed:  No     Care Coordination Interventions Activated:  Yes   Care Coordination Interventions:  Yes, provided   Follow up plan: Follow up call scheduled for 01/30/23    Multidisciplinary Team Attendees:   [place], LCSW George Ina, RN Karoline Caldwell, [place]  Scribe for Multidisciplinary Case Review:  [place], [place]

## 2023-01-30 NOTE — ED Notes (Signed)
Pt waiting on friend to pick him up, states on the way

## 2023-01-30 NOTE — Discharge Instructions (Signed)
You have been seen in the Emergency Department (ED) for abdominal and/or chest pain.  Your evaluation suggests that your pain is caused by gallstones.  Fortunately you do not need immediate surgery at this time, but it is important that you follow up with a surgeon as an outpatient; typically surgical removal of the gallbladder is the only thing that will definitively fix your issue.  Read through the included information about a bland diet, and use any prescribed medications as instructed.  Avoid smoking and alcohol use.  Please follow up as instructed above regarding today's emergent visit and the symptoms that are bothering you.  Return to the ED if your abdominal pain worsens or fails to improve, you develop bloody vomiting, bloody diarrhea, you are unable to tolerate fluids due to vomiting, fever greater than 101, or other symptoms that concern you.

## 2023-01-30 NOTE — ED Triage Notes (Addendum)
Pt arrived via [facility] from apartment with c/o substernal chest pain that radiates into left arm x14 hours. Pt received 324mg  Aspirin in route. Pt denies cardiac hx. Pt sts he has had recent increase in stress and anxiety levels.   **Pt de-conned on arrival due to known bed bug infestation at his current residence.

## 2023-01-30 NOTE — Patient Instructions (Signed)
Visit Information  Thank you for taking time to visit with me today. Please don't hesitate to contact me if I can be of assistance to you.   Following are the goals we discussed today:  1.Please follow up with Legal Aid and the [facility] for additional advocacy and support related to your current living conditions   Our next appointment is by telephone on 02/03/23 at 2:30pm  Please call the care guide team at [phone] if you need to cancel or reschedule your appointment.   If you are experiencing a Mental Health or Behavioral Health Crisis or need someone to talk to, please call the Suicide and Crisis Lifeline: 988   Patient verbalizes understanding of instructions and care plan provided today and agrees to view in MyChart. Active MyChart status and patient understanding of how to access instructions and care plan via MyChart confirmed with patient.     Telephone follow up appointment with care management team member scheduled for: 02/03/23  Verna Czech, LCSW [place]  [facility], [facility] Licensed Clinical Social Worker Care Coordinator  Direct Dial: [phone]

## 2023-01-30 NOTE — ED Notes (Signed)
Pt attempting to call rides for d/c

## 2023-01-30 NOTE — Progress Notes (Signed)
This encounter was created in error - please disregard.

## 2023-01-31 NOTE — Telephone Encounter (Signed)
Pt. Reports he has an appointment with a surgeon 02/18/23, Dr. Henrene Dodge. Asking if PCP can get him in sooner, continues to have pain.Please advise pt.

## 2023-01-31 NOTE — Telephone Encounter (Signed)
Patient advised. Verbalized understanding 

## 2023-02-03 ENCOUNTER — Ambulatory Visit: Payer: Self-pay | Admitting: *Deleted

## 2023-02-04 NOTE — Patient Instructions (Signed)
Visit Information  Thank you for taking time to visit with me today. Please don't hesitate to contact me if I can be of assistance to you.  Contact Legal Aid for housing advocacy if needed [phone] Contact the Disabilities [facility]acy Center for additional resources and housing advocacy  [phone]   Our next appointment is by telephone on 02/18/23 at 1pm  Please call the care guide team at [phone] if you need to cancel or reschedule your appointment.   If you are experiencing a Mental Health or Behavioral Health Crisis or need someone to talk to, please call the Suicide and Crisis Lifeline: 988   Patient verbalizes understanding of instructions and care plan provided today and agrees to view in MyChart. Active MyChart status and patient understanding of how to access instructions and care plan via MyChart confirmed with patient.     Telephone follow up appointment with care management team member scheduled for: 02/18/23 Verna Czech, LCSW [place]  [facility], [facility] Licensed Clinical Social Worker Care Coordinator  Direct Dial: [phone]

## 2023-02-04 NOTE — Patient Outreach (Signed)
Care Coordination   Follow Up Visit Note   02/04/2023 Name: ANTONINE RATTERREE MRN: [id number] DOB: 20-Jan-1951  [name] is a 72 y.o. year old male who sees [name], FNP for primary care. I spoke with  [name] by phone today.  What matters to the patients health and wellness today?  Patient discussed concerns related to his housing and barriers related to pest control which triggers symptoms of anxiety.   Goals Addressed             This Visit's Progress    care coordination activities       Interventions Today    Flowsheet Row Most Recent Value  Chronic Disease   Chronic disease during today's visit Hypertension (HTN)  General Interventions   General Interventions Discussed/Reviewed General Interventions Reviewed, [employer]  [confirmed that home has been treated for pest, now waiting on completion of tx-discussed consideration of  replacing furniture once ts is complete-denies need for SW in. w/ mgmnt co. pt reminded of con. info for Legal Aid and [facility]]  Doctor Visits Discussed/Reviewed Doctor Visits Discussed, PCP  [PCP 02/05/23]  Mental Health Interventions   Mental Health Discussed/Reviewed Mental Health Reviewed, Coping Strategies, Anxiety              SDOH assessments and interventions completed:  No     Care Coordination Interventions:  Yes, provided   Follow up plan: Follow up call scheduled for 02/18/23    Encounter Outcome:  Patient Visit Completed

## 2023-02-05 ENCOUNTER — Ambulatory Visit (INDEPENDENT_AMBULATORY_CARE_PROVIDER_SITE_OTHER): Payer: Medicare HMO | Admitting: Family Medicine

## 2023-02-05 ENCOUNTER — Encounter: Payer: Self-pay | Admitting: Family Medicine

## 2023-02-05 ENCOUNTER — Telehealth: Payer: Self-pay | Admitting: Family Medicine

## 2023-02-05 VITALS — BP 119/77 | HR 78 | Temp 97.6°F | Ht 69.0 in | Wt 220.1 lb

## 2023-02-05 DIAGNOSIS — K819 Cholecystitis, unspecified: Secondary | ICD-10-CM | POA: Insufficient documentation

## 2023-02-05 DIAGNOSIS — K8020 Calculus of gallbladder without cholecystitis without obstruction: Secondary | ICD-10-CM | POA: Diagnosis not present

## 2023-02-05 MED ORDER — ALUM & MAG HYDROXIDE-SIMETH 400-400-40 MG/5ML PO SUSP: 15.0000 mL | Freq: Four times a day (QID) | ORAL | 1 refills | Status: DC | PRN

## 2023-02-05 NOTE — Telephone Encounter (Signed)
Medication Refill - Medication: magic mouthwash (nystatin, lidocaine, diphenhydrAMINE, alum & mag hydroxide) suspension  Has the patient contacted their pharmacy? Yes.    Preferred Pharmacy (with phone number or street name):  [facility] - [place], [place] - [address] Phone: [phone]  Fax: [phone]     Has the patient been seen for an appointment in the last year OR does the patient have an upcoming appointment? Yes.    Agent: Please be advised that RX refills may take up to 3 business days. We ask that you follow-up with your pharmacy.

## 2023-02-05 NOTE — Progress Notes (Signed)
Established patient visit   Patient: [name]   DOB: [birth year]   72 y.o. Male  MRN: [id number] Visit Date: 02/05/2023  Today's healthcare provider: [name], FNP  Introduced to nurse practitioner role and practice setting.  All questions answered.  Discussed provider/patient relationship and expectations.  Subjective    HPI  ABDOMINAL PAIN  Seen at [facility] on 9/19- upcoming f/u with Gen surg on 10/7  Location:  LUQ, RUQ, epigastric, and diffuse  Episode duration:  Radiation: yes Frequency: intermittent Alleviating factors:  Aggravating factors: Status: better  Medications: Outpatient Medications Prior to Visit  Medication Sig   amLODipine (NORVASC) 10 MG tablet Take 1 tablet (10 mg total) by mouth daily.   antiseptic oral rinse (BIOTENE) LIQD 15 mLs by Mouth Rinse route as needed for dry mouth.   aspirin EC 81 MG tablet Take 81 mg by mouth daily. Swallow whole.   Blood Glucose Monitoring Suppl DEVI 1 each by Does not apply route in the morning, at noon, and at bedtime. May substitute to any manufacturer covered by patient's insurance. Pt's current glucometer is broken.   buprenorphine (BUTRANS) 15 MCG/HR Place 1 patch onto the skin once a week.   Ipratropium-Albuterol (COMBIVENT RESPIMAT) 20-100 MCG/ACT AERS respimat Inhale 1 puff into the lungs every 6 (six) hours as needed. Rescue inhaler   omeprazole (PRILOSEC) 40 MG capsule TAKE 1 CAPSULE BY MOUTH ONCE DAILY   QUEtiapine (SEROQUEL) 25 MG tablet Take 1 tablet (25 mg total) by mouth at bedtime.   rosuvastatin (CRESTOR) 40 MG tablet Take 1 tablet (40 mg total) by mouth daily.   [DISCONTINUED] magic mouthwash (nystatin, lidocaine, diphenhydrAMINE, alum & mag hydroxide) suspension Swish and spit 5 mLs 3 (three) times daily as needed for mouth pain.   Facility-Administered Medications Prior to Visit  Medication Dose Route Frequency Provider   heparin lock flush 100 unit/mL  500 Units Intravenous Once Creig Hines, MD    Objective    BP 119/77 (BP Location: Right Arm, Patient Position: Sitting, Cuff Size: Normal)   Pulse 78   Temp 97.6 F (36.4 C) (Oral)   Ht 5\' 9"  (1.753 m)   Wt 220 lb 1.6 oz (99.8 kg)   SpO2 99%   BMI 32.50 kg/m   Physical Exam Vitals and nursing note reviewed.  Constitutional:      General: He is not in acute distress.    Appearance: Normal appearance. He is obese. He is not ill-appearing, toxic-appearing or diaphoretic.  HENT:     Head: Normocephalic and atraumatic.  Cardiovascular:     Rate and Rhythm: Normal rate and regular rhythm.     Pulses: Normal pulses.     Heart sounds: Normal heart sounds.  Pulmonary:     Effort: Pulmonary effort is normal.     Breath sounds: Normal breath sounds.  Abdominal:     General: Bowel sounds are normal.     Palpations: Abdomen is soft.  Musculoskeletal:        General: Normal range of motion.     Cervical back: Normal range of motion.  Skin:    General: Skin is warm and dry.     Capillary Refill: Capillary refill takes less than 2 seconds.  Neurological:     General: No focal deficit present.     Mental Status: He is alert and oriented to person, place, and time. Mental status is at baseline.  Psychiatric:        Mood  and Affect: Mood normal.        Behavior: Behavior normal.        Thought Content: Thought content normal.        Judgment: Judgment normal.     No results found for any visits on 02/05/23.  Assessment & Plan     Problem List Items Addressed This Visit       Digestive   Calculus of gallbladder without cholecystitis without obstruction - Primary    Acute, self limiting Continue conservative treatment  Has upcoming appt with gen surg Continue dietary modifications as advised       Relevant Medications   alum & mag hydroxide-simeth (MAALOX PLUS) 400-400-40 MG/5ML suspension   Return if symptoms worsen or fail to improve.     Leilani Merl, FNP, have reviewed all documentation for  this visit. The documentation on 02/05/23 for the exam, diagnosis, procedures, and orders are all accurate and complete.  [name], FNP  [facility] [phone] (phone) [phone] (fax)  [facility]

## 2023-02-05 NOTE — Telephone Encounter (Signed)
magic mouthwash (nystatin, lidocaine, diphenhydrAMINE, alum & mag hydroxide) suspension not on current list routing for approval

## 2023-02-05 NOTE — Assessment & Plan Note (Signed)
Acute, self limiting Continue conservative treatment  Has upcoming appt with gen surg Continue dietary modifications as advised

## 2023-02-06 ENCOUNTER — Other Ambulatory Visit: Payer: Self-pay | Admitting: Family Medicine

## 2023-02-06 DIAGNOSIS — B370 Candidal stomatitis: Secondary | ICD-10-CM

## 2023-02-06 MED ORDER — NYSTATIN 100000 UNIT/ML MT SUSP
15.0000 mL | Freq: Four times a day (QID) | ORAL | 11 refills | Status: DC | PRN
Start: 2023-02-06 — End: 2023-03-05

## 2023-02-06 NOTE — Telephone Encounter (Signed)
Per patient he cannot afford to come in again and that said "sorry I cannot remember everything when I come in to see her. She has prescribed this for me before. The oral pain is not knew, it comes from the cancer treatment. She knows about this"

## 2023-02-15 ENCOUNTER — Emergency Department: Payer: Medicare HMO

## 2023-02-15 ENCOUNTER — Other Ambulatory Visit: Payer: Self-pay

## 2023-02-15 ENCOUNTER — Inpatient Hospital Stay
Admission: EM | Admit: 2023-02-15 | Discharge: 2023-02-18 | DRG: 445 | Disposition: A | Discharge status: Home Health | Payer: Medicare HMO | Attending: Surgery | Admitting: Surgery

## 2023-02-15 DIAGNOSIS — Z882 Allergy status to sulfonamides status: Secondary | ICD-10-CM

## 2023-02-15 DIAGNOSIS — G620 Drug-induced polyneuropathy: Secondary | ICD-10-CM | POA: Diagnosis not present

## 2023-02-15 DIAGNOSIS — Z8261 Family history of arthritis: Secondary | ICD-10-CM

## 2023-02-15 DIAGNOSIS — Z818 Family history of other mental and behavioral disorders: Secondary | ICD-10-CM

## 2023-02-15 DIAGNOSIS — Z888 Allergy status to other drugs, medicaments and biological substances status: Secondary | ICD-10-CM

## 2023-02-15 DIAGNOSIS — I152 Hypertension secondary to endocrine disorders: Secondary | ICD-10-CM | POA: Diagnosis present

## 2023-02-15 DIAGNOSIS — Z1152 Encounter for screening for COVID-19: Secondary | ICD-10-CM

## 2023-02-15 DIAGNOSIS — Z923 Personal history of irradiation: Secondary | ICD-10-CM

## 2023-02-15 DIAGNOSIS — Z841 Family history of disorders of kidney and ureter: Secondary | ICD-10-CM

## 2023-02-15 DIAGNOSIS — E669 Obesity, unspecified: Secondary | ICD-10-CM | POA: Diagnosis not present

## 2023-02-15 DIAGNOSIS — T451X5A Adverse effect of antineoplastic and immunosuppressive drugs, initial encounter: Secondary | ICD-10-CM | POA: Diagnosis not present

## 2023-02-15 DIAGNOSIS — Z8249 Family history of ischemic heart disease and other diseases of the circulatory system: Secondary | ICD-10-CM | POA: Diagnosis not present

## 2023-02-15 DIAGNOSIS — J440 Chronic obstructive pulmonary disease with acute lower respiratory infection: Secondary | ICD-10-CM | POA: Diagnosis not present

## 2023-02-15 DIAGNOSIS — I700 Atherosclerosis of aorta: Secondary | ICD-10-CM | POA: Diagnosis not present

## 2023-02-15 DIAGNOSIS — Z885 Allergy status to narcotic agent status: Secondary | ICD-10-CM

## 2023-02-15 DIAGNOSIS — F0393 Unspecified dementia, unspecified severity, with mood disturbance: Secondary | ICD-10-CM | POA: Diagnosis present

## 2023-02-15 DIAGNOSIS — C119 Malignant neoplasm of nasopharynx, unspecified: Secondary | ICD-10-CM | POA: Diagnosis present

## 2023-02-15 DIAGNOSIS — G40909 Epilepsy, unspecified, not intractable, without status epilepticus: Secondary | ICD-10-CM | POA: Diagnosis present

## 2023-02-15 DIAGNOSIS — K828 Other specified diseases of gallbladder: Secondary | ICD-10-CM | POA: Diagnosis not present

## 2023-02-15 DIAGNOSIS — G4730 Sleep apnea, unspecified: Secondary | ICD-10-CM | POA: Diagnosis present

## 2023-02-15 DIAGNOSIS — K859 Acute pancreatitis without necrosis or infection, unspecified: Secondary | ICD-10-CM

## 2023-02-15 DIAGNOSIS — Z79899 Other long term (current) drug therapy: Secondary | ICD-10-CM | POA: Diagnosis not present

## 2023-02-15 DIAGNOSIS — J209 Acute bronchitis, unspecified: Secondary | ICD-10-CM | POA: Diagnosis present

## 2023-02-15 DIAGNOSIS — K8020 Calculus of gallbladder without cholecystitis without obstruction: Secondary | ICD-10-CM | POA: Diagnosis not present

## 2023-02-15 DIAGNOSIS — K8000 Calculus of gallbladder with acute cholecystitis without obstruction: Principal | ICD-10-CM | POA: Diagnosis present

## 2023-02-15 DIAGNOSIS — Z8673 Personal history of transient ischemic attack (TIA), and cerebral infarction without residual deficits: Secondary | ICD-10-CM

## 2023-02-15 DIAGNOSIS — J441 Chronic obstructive pulmonary disease with (acute) exacerbation: Secondary | ICD-10-CM | POA: Diagnosis present

## 2023-02-15 DIAGNOSIS — Z87891 Personal history of nicotine dependence: Secondary | ICD-10-CM | POA: Diagnosis not present

## 2023-02-15 DIAGNOSIS — J449 Chronic obstructive pulmonary disease, unspecified: Secondary | ICD-10-CM | POA: Diagnosis present

## 2023-02-15 DIAGNOSIS — K810 Acute cholecystitis: Secondary | ICD-10-CM | POA: Diagnosis not present

## 2023-02-15 DIAGNOSIS — Z7982 Long term (current) use of aspirin: Secondary | ICD-10-CM

## 2023-02-15 DIAGNOSIS — Z6832 Body mass index (BMI) 32.0-32.9, adult: Secondary | ICD-10-CM | POA: Diagnosis not present

## 2023-02-15 DIAGNOSIS — Z85818 Personal history of malignant neoplasm of other sites of lip, oral cavity, and pharynx: Secondary | ICD-10-CM

## 2023-02-15 DIAGNOSIS — I10 Essential (primary) hypertension: Secondary | ICD-10-CM | POA: Diagnosis present

## 2023-02-15 DIAGNOSIS — G90523 Complex regional pain syndrome I of lower limb, bilateral: Secondary | ICD-10-CM | POA: Diagnosis present

## 2023-02-15 DIAGNOSIS — E1159 Type 2 diabetes mellitus with other circulatory complications: Secondary | ICD-10-CM | POA: Diagnosis present

## 2023-02-15 DIAGNOSIS — Z811 Family history of alcohol abuse and dependence: Secondary | ICD-10-CM

## 2023-02-15 DIAGNOSIS — F0390 Unspecified dementia without behavioral disturbance: Secondary | ICD-10-CM | POA: Diagnosis not present

## 2023-02-15 DIAGNOSIS — R748 Abnormal levels of other serum enzymes: Secondary | ICD-10-CM | POA: Diagnosis not present

## 2023-02-15 DIAGNOSIS — Z9221 Personal history of antineoplastic chemotherapy: Secondary | ICD-10-CM

## 2023-02-15 DIAGNOSIS — K8010 Calculus of gallbladder with chronic cholecystitis without obstruction: Secondary | ICD-10-CM | POA: Diagnosis not present

## 2023-02-15 DIAGNOSIS — J9611 Chronic respiratory failure with hypoxia: Secondary | ICD-10-CM | POA: Diagnosis present

## 2023-02-15 DIAGNOSIS — R079 Chest pain, unspecified: Secondary | ICD-10-CM | POA: Diagnosis not present

## 2023-02-15 LAB — BASIC METABOLIC PANEL
Anion gap: 9 (ref 5–15)
BUN: 12 mg/dL (ref 8–23)
CO2: 25 mmol/L (ref 22–32)
Calcium: 8.3 mg/dL — ABNORMAL LOW (ref 8.9–10.3)
Chloride: 97 mmol/L — ABNORMAL LOW (ref 98–111)
Creatinine, Ser: 1.06 mg/dL (ref 0.61–1.24)
GFR, Estimated: >60 mL/min (ref >60)
Glucose, Bld: 172 mg/dL — ABNORMAL HIGH (ref 70–99)
Potassium: 4 mmol/L (ref 3.5–5.1)
Sodium: 131 mmol/L — ABNORMAL LOW (ref 135–145)

## 2023-02-15 LAB — HEPATIC FUNCTION PANEL
ALT: 20 U/L (ref 0–44)
AST: 29 U/L (ref 15–41)
Albumin: 3.4 g/dL — ABNORMAL LOW (ref 3.5–5.0)
Alkaline Phosphatase: 76 U/L (ref 38–126)
Bilirubin, Direct: 0.1 mg/dL (ref 0.0–0.2)
Indirect Bilirubin: 0.4 mg/dL (ref 0.3–0.9)
Total Bilirubin: 0.5 mg/dL (ref 0.3–1.2)
Total Protein: 7.5 g/dL (ref 6.5–8.1)

## 2023-02-15 LAB — CBC
HCT: 36.6 % — ABNORMAL LOW (ref 39.0–52.0)
Hemoglobin: 12.2 g/dL — ABNORMAL LOW (ref 13.0–17.0)
MCH: 30.3 pg (ref 26.0–34.0)
MCHC: 33.3 g/dL (ref 30.0–36.0)
MCV: 90.8 fL (ref 80.0–100.0)
Platelets: 250 10*3/uL (ref 150–400)
RBC: 4.03 MIL/uL — ABNORMAL LOW (ref 4.22–5.81)
RDW: 14.1 % (ref 11.5–15.5)
WBC: 13.9 10*3/uL — ABNORMAL HIGH (ref 4.0–10.5)
nRBC: 0 % (ref 0.0–0.2)

## 2023-02-15 LAB — LIPASE, BLOOD: Lipase: 366 U/L — ABNORMAL HIGH (ref 11–51)

## 2023-02-15 LAB — RESP PANEL BY RT-PCR (RSV, FLU A&B, COVID)  RVPGX2
Influenza A by PCR: NEGATIVE
Influenza B by PCR: NEGATIVE
Resp Syncytial Virus by PCR: NEGATIVE
SARS Coronavirus 2 by RT PCR: NEGATIVE

## 2023-02-15 LAB — TROPONIN I (HIGH SENSITIVITY): Troponin I (High Sensitivity): 9 ng/L (ref <18)

## 2023-02-15 LAB — CBG MONITORING, ED
Glucose-Capillary: 115 mg/dL — ABNORMAL HIGH (ref 70–99)
Glucose-Capillary: 121 mg/dL — ABNORMAL HIGH (ref 70–99)

## 2023-02-15 LAB — LACTIC ACID, PLASMA
Lactic Acid, Venous: 1.2 mmol/L (ref 0.5–1.9)
Lactic Acid, Venous: 1.2 mmol/L (ref 0.5–1.9)

## 2023-02-15 MED ORDER — INSULIN ASPART 100 UNIT/ML IJ SOLN
0.0000 [IU] | INTRAMUSCULAR | Status: DC
  Administered 2023-02-15 – 2023-02-16 (×2): 2 [IU] via SUBCUTANEOUS
  Administered 2023-02-17: 3 [IU] via SUBCUTANEOUS
  Filled 2023-02-15 (×3): qty 1

## 2023-02-15 MED ORDER — IPRATROPIUM-ALBUTEROL 0.5-2.5 (3) MG/3ML IN SOLN
3.0000 mL | Freq: Four times a day (QID) | RESPIRATORY_TRACT | Status: DC | PRN
  Administered 2023-02-15: 3 mL via RESPIRATORY_TRACT
  Filled 2023-02-15: qty 3

## 2023-02-15 MED ORDER — IPRATROPIUM-ALBUTEROL 20-100 MCG/ACT IN AERS: 1.0000 | INHALATION_SPRAY | Freq: Four times a day (QID) | RESPIRATORY_TRACT | Status: DC | PRN

## 2023-02-15 MED ORDER — ONDANSETRON HCL 4 MG/2ML IJ SOLN: 4.0000 mg | Freq: Four times a day (QID) | INTRAMUSCULAR | Status: DC | PRN

## 2023-02-15 MED ORDER — MAGIC MOUTHWASH W/LIDOCAINE: 15.0000 mL | Freq: Four times a day (QID) | ORAL | Status: DC | PRN

## 2023-02-15 MED ORDER — NYSTATIN 100000 UNIT/ML MT SUSP: 15.0000 mL | Freq: Four times a day (QID) | OROMUCOSAL | Status: DC | PRN

## 2023-02-15 MED ORDER — ONDANSETRON 4 MG PO TBDP: 4.0000 mg | ORAL_TABLET | Freq: Four times a day (QID) | ORAL | Status: DC | PRN

## 2023-02-15 MED ORDER — MORPHINE SULFATE (PF) 4 MG/ML IV SOLN
4.0000 mg | Freq: Once | INTRAVENOUS | Status: AC
  Administered 2023-02-15: 4 mg via INTRAVENOUS
  Filled 2023-02-15: qty 1

## 2023-02-15 MED ORDER — AMLODIPINE BESYLATE 10 MG PO TABS
10.0000 mg | ORAL_TABLET | Freq: Every day | ORAL | Status: DC
  Administered 2023-02-16 – 2023-02-18 (×3): 10 mg via ORAL
  Filled 2023-02-15: qty 2
  Filled 2023-02-15 (×2): qty 1

## 2023-02-15 MED ORDER — LACTATED RINGERS IV SOLN: INTRAVENOUS | Status: DC

## 2023-02-15 MED ORDER — MORPHINE SULFATE (PF) 4 MG/ML IV SOLN
4.0000 mg | Freq: Once | INTRAVENOUS | Status: AC
  Administered 2023-02-15: 4 mg via INTRAVENOUS
  Filled 2023-02-15 (×2): qty 1

## 2023-02-15 MED ORDER — BUPRENORPHINE 15 MCG/HR TD PTWK: 1.0000 | MEDICATED_PATCH | TRANSDERMAL | Status: DC

## 2023-02-15 MED ORDER — HYDROMORPHONE HCL 1 MG/ML IJ SOLN
0.5000 mg | INTRAMUSCULAR | Status: DC | PRN
  Administered 2023-02-16 – 2023-02-18 (×2): 0.5 mg via INTRAVENOUS
  Filled 2023-02-15 (×2): qty 0.5

## 2023-02-15 MED ORDER — BIOTENE DRY MOUTH MT LIQD: 15.0000 mL | OROMUCOSAL | Status: DC | PRN

## 2023-02-15 MED ORDER — ACETAMINOPHEN 500 MG PO TABS
1000.0000 mg | ORAL_TABLET | Freq: Four times a day (QID) | ORAL | Status: DC
  Administered 2023-02-15 – 2023-02-18 (×10): 1000 mg via ORAL
  Filled 2023-02-15 (×10): qty 2

## 2023-02-15 MED ORDER — PANTOPRAZOLE SODIUM 40 MG IV SOLR
40.0000 mg | Freq: Every day | INTRAVENOUS | Status: DC
  Administered 2023-02-15 – 2023-02-16 (×2): 40 mg via INTRAVENOUS
  Filled 2023-02-15 (×2): qty 10

## 2023-02-15 MED ORDER — POLYETHYLENE GLYCOL 3350 17 G PO PACK
17.0000 g | PACK | Freq: Every day | ORAL | Status: DC | PRN
  Administered 2023-02-18: 17 g via ORAL
  Filled 2023-02-15: qty 1

## 2023-02-15 MED ORDER — PIPERACILLIN-TAZOBACTAM 3.375 G IVPB 30 MIN
3.3750 g | Freq: Once | INTRAVENOUS | Status: AC
  Administered 2023-02-15: 3.375 g via INTRAVENOUS
  Filled 2023-02-15: qty 50

## 2023-02-15 MED ORDER — QUETIAPINE FUMARATE 25 MG PO TABS
25.0000 mg | ORAL_TABLET | Freq: Every day | ORAL | Status: DC
  Administered 2023-02-15 – 2023-02-17 (×3): 25 mg via ORAL
  Filled 2023-02-15 (×3): qty 1

## 2023-02-15 MED ORDER — SODIUM CHLORIDE 0.9 % IV BOLUS
1000.0000 mL | Freq: Once | INTRAVENOUS | Status: AC
  Administered 2023-02-15: 1000 mL via INTRAVENOUS

## 2023-02-15 MED ORDER — ORAL CARE MOUTH RINSE: 15.0000 mL | OROMUCOSAL | Status: DC | PRN

## 2023-02-15 MED ORDER — PIPERACILLIN-TAZOBACTAM 3.375 G IVPB
3.3750 g | Freq: Three times a day (TID) | INTRAVENOUS | Status: DC
  Administered 2023-02-16 – 2023-02-18 (×7): 3.375 g via INTRAVENOUS
  Filled 2023-02-15 (×6): qty 50

## 2023-02-15 MED ORDER — GADOBUTROL 1 MMOL/ML IV SOLN
10.0000 mL | Freq: Once | INTRAVENOUS | Status: AC | PRN
  Administered 2023-02-15: 10 mL via INTRAVENOUS

## 2023-02-15 MED ORDER — HYDROCOD POLI-CHLORPHE POLI ER 10-8 MG/5ML PO SUER
5.0000 mL | Freq: Two times a day (BID) | ORAL | Status: DC | PRN
  Administered 2023-02-15 – 2023-02-18 (×5): 5 mL via ORAL
  Filled 2023-02-15 (×5): qty 5

## 2023-02-15 NOTE — ED Provider Notes (Incomplete)
[facility] Provider Note    Event Date/Time   First MD Initiated Contact with Patient 02/15/23 1644     (approximate)   History   Chest Pain   HPI  [name] is a 72 year old male with history of seizure disorder, GERD, hypertension, cholelithiasis presenting to the emerged department for evaluation of abdominal pain.  Patient reports that yesterday began develop nausea and vomiting with associated shortness of breath as well as pain in his right upper quadrant that he thinks is related to his gallbladder.  Was seen last month for what he says was nearly identical symptoms and was told that his pain was related to his gallbladder.  No fevers.     Physical Exam   Triage Vital Signs: ED Triage Vitals  Encounter Vitals Group     BP 02/15/23 1541 108/67     Systolic BP Percentile --      Diastolic BP Percentile --      Pulse Rate 02/15/23 1541 (!) 110     Resp 02/15/23 1541 18     Temp 02/15/23 1541 100.3 F (37.9 C)     Temp Source 02/15/23 1541 Oral     SpO2 02/15/23 1541 97 %     Weight 02/15/23 1542 220 lb (99.8 kg)     Height 02/15/23 1542 5\' 9"  (1.753 m)     Head Circumference --      Peak Flow --      Pain Score 02/15/23 1547 7     Pain Loc --      Pain Education --      Exclude from Growth Chart --     Most recent vital signs: Vitals:   02/15/23 2346 02/15/23 2347  BP:  (!) 137/59  Pulse:  90  Resp:  19  Temp:    SpO2: 100% 97%     General: Awake, interactive  CV:  Regular rate, good peripheral perfusion.  Resp:  Lungs clear, unlabored respirations.  Abd:  Soft, nondistended, tender to palpation in the epigastric region into the right upper quadrant without rebound or guarding, remainder of abdomen nontender Neuro:  Symmetric facial movement, fluid speech   ED Results / Procedures / Treatments   Labs (all labs ordered are listed, but only abnormal results are displayed) Labs Reviewed  BASIC METABOLIC PANEL  - Abnormal; Notable for the following components:      Result Value   Sodium 131 (*)    Chloride 97 (*)    Glucose, Bld 172 (*)    Calcium 8.3 (*)    All other components within normal limits  CBC - Abnormal; Notable for the following components:   WBC 13.9 (*)    RBC 4.03 (*)    Hemoglobin 12.2 (*)    HCT 36.6 (*)    All other components within normal limits  HEPATIC FUNCTION PANEL - Abnormal; Notable for the following components:   Albumin 3.4 (*)    All other components within normal limits  LIPASE, BLOOD - Abnormal; Notable for the following components:   Lipase 366 (*)    All other components within normal limits  CBG MONITORING, ED - Abnormal; Notable for the following components:   Glucose-Capillary 115 (*)    All other components within normal limits  CBG MONITORING, ED - Abnormal; Notable for the following components:   Glucose-Capillary 121 (*)    All other components within normal limits  RESP PANEL BY RT-PCR (RSV, FLU A&B,  COVID)  RVPGX2  CULTURE, BLOOD (ROUTINE X 2)  CULTURE, BLOOD (ROUTINE X 2)  LACTIC ACID, PLASMA  LACTIC ACID, PLASMA  HIV ANTIBODY (ROUTINE TESTING W REFLEX)  COMPREHENSIVE METABOLIC PANEL  CBC  TROPONIN I (HIGH SENSITIVITY)  TROPONIN I (HIGH SENSITIVITY)     EKG EKG independently reviewed interpreted by myself (ER attending) demonstrates:  EKG demonstrate sinus tachycardia at a rate of 102, PR 118, QRS 84, QTc 427, no acute ST changes  RADIOLOGY Imaging independently reviewed and interpreted by myself demonstrates:    PROCEDURES:  Critical Care performed: {CriticalCareYesNo:19197::"Yes, see critical care procedure note(s)","No"}  Procedures   MEDICATIONS ORDERED IN ED: Medications  lactated ringers infusion ( Intravenous New Bag/Given 02/15/23 2321)  acetaminophen (TYLENOL) tablet 1,000 mg (1,000 mg Oral Given 02/15/23 2338)  HYDROmorphone (DILAUDID) injection 0.5 mg (has no administration in time range)  polyethylene glycol  (MIRALAX / GLYCOLAX) packet 17 g (has no administration in time range)  ondansetron (ZOFRAN-ODT) disintegrating tablet 4 mg (has no administration in time range)    Or  ondansetron (ZOFRAN) injection 4 mg (has no administration in time range)  pantoprazole (PROTONIX) injection 40 mg (40 mg Intravenous Given 02/15/23 2223)  piperacillin-tazobactam (ZOSYN) IVPB 3.375 g (has no administration in time range)  insulin aspart (novoLOG) injection 0-15 Units (2 Units Subcutaneous Given 02/15/23 2338)  QUEtiapine (SEROQUEL) tablet 25 mg (25 mg Oral Given 02/15/23 2223)  amLODipine (NORVASC) tablet 10 mg (has no administration in time range)  ipratropium-albuterol (DUONEB) 0.5-2.5 (3) MG/3ML nebulizer solution 3 mL (3 mLs Nebulization Given 02/15/23 2338)  magic mouthwash w/lidocaine (has no administration in time range)  Oral care mouth rinse (has no administration in time range)  chlorpheniramine-HYDROcodone (TUSSIONEX) 10-8 MG/5ML suspension 5 mL (5 mLs Oral Given 02/15/23 2344)  sodium chloride 0.9 % bolus 1,000 mL (0 mLs Intravenous Stopped 02/15/23 1950)  morphine (PF) 4 MG/ML injection 4 mg (4 mg Intravenous Given 02/15/23 1729)  piperacillin-tazobactam (ZOSYN) IVPB 3.375 g (0 g Intravenous Stopped 02/15/23 2102)  morphine (PF) 4 MG/ML injection 4 mg (4 mg Intravenous Given 02/15/23 1959)  gadobutrol (GADAVIST) 1 MMOL/ML injection 10 mL (10 mLs Intravenous Contrast Given 02/15/23 2051)     IMPRESSION / MDM / ASSESSMENT AND PLAN / ED COURSE  I reviewed the triage vital signs and the nursing notes.  Differential diagnosis includes, but is not limited to, ***  Patient's presentation is most consistent with {EM COPA:27473}  ***  Clinical Course as of 02/16/23 0003  Sat Feb 15, 2023  1900 US Abdomen Limited RUQ (LIVER/GB) Ultrasound concerning for acute cholecystitis.  Interestingly, patient has an elevated lipase, but normal LFTs and bilirubin.  Reviewed with Dr. Aleen Campi.  Recommends MRCP to  determine if patient has biliary stone, Zosyn, and reconsultation after completion of MRCP. [NR]  2125 MR ABDOMEN MRCP W WO CONTAST MRCP redemonstrates findings of acute cholecystitis, no biliary stone noted.  Will recontact surgery team. [NR]  2140 Reviewed with Dr. Aleen Campi.  He will admit the patient for anticipated intervention tomorrow. [NR]    Clinical Course User Index [NR] Trinna Post, MD     FINAL CLINICAL IMPRESSION(S) / ED DIAGNOSES   Final diagnoses:  None     Rx / DC Orders   ED Discharge Orders     None        Note:  This document was prepared using Dragon voice recognition software and may include unintentional dictation errors.

## 2023-02-15 NOTE — Progress Notes (Signed)
Pt being followed by [facility] for Sepsis protocol. 

## 2023-02-15 NOTE — ED Triage Notes (Addendum)
Pt c/o chest pain, cough, gallbladder pain, fever, and SHOB. Pt AOX4, nonproductive, cough noted. Pt threw up yesterday, no diarrhea. Coarse crackles noted bilaterally.

## 2023-02-15 NOTE — Progress Notes (Signed)
CODE SEPSIS - PHARMACY COMMUNICATION  **Broad Spectrum Antibiotics should be administered within 1 hour of Sepsis diagnosis**  Time Code Sepsis Called/Page Received: 1905  Antibiotics Ordered: Zosyn  Time of 1st antibiotic administration: 1950  Additional action taken by pharmacy: None  If necessary, Name of Provider/Nurse Contacted: None    Rockwell Alexandria ,PharmD Clinical Pharmacist  02/15/2023  9:05 PM

## 2023-02-15 NOTE — ED Provider Notes (Signed)
[facility] Provider Note    Event Date/Time   First MD Initiated Contact with Patient 02/15/23 1644     (approximate)   History   Chest Pain   HPI  CORION SHERROD IV is a 72 year old male with history of seizure disorder, GERD, hypertension, cholelithiasis presenting to the emerged department for evaluation of abdominal pain.  Patient reports that yesterday began develop nausea and vomiting with associated shortness of breath as well as pain in his right upper quadrant that he thinks is related to his gallbladder.  Was seen last month for what he says was nearly identical symptoms and was told that his pain was related to his gallbladder.  Reports subjective fevers at home.     Physical Exam   Triage Vital Signs: ED Triage Vitals  Encounter Vitals Group     BP 02/15/23 1541 108/67     Systolic BP Percentile --      Diastolic BP Percentile --      Pulse Rate 02/15/23 1541 (!) 110     Resp 02/15/23 1541 18     Temp 02/15/23 1541 100.3 F (37.9 C)     Temp Source 02/15/23 1541 Oral     SpO2 02/15/23 1541 97 %     Weight 02/15/23 1542 220 lb (99.8 kg)     Height 02/15/23 1542 5\' 9"  (1.753 m)     Head Circumference --      Peak Flow --      Pain Score 02/15/23 1547 7     Pain Loc --      Pain Education --      Exclude from Growth Chart --     Most recent vital signs: Vitals:   02/15/23 2346 02/15/23 2347  BP:  (!) 137/59  Pulse:  90  Resp:  19  Temp:    SpO2: 100% 97%     General: Awake, interactive  CV:  Regular rate, good peripheral perfusion.  Resp:  Lungs clear, unlabored respirations.  Abd:  Soft, nondistended, tender to palpation in the epigastric region into the right upper quadrant without rebound or guarding, remainder of abdomen nontender Neuro:  Symmetric facial movement, fluid speech   ED Results / Procedures / Treatments   Labs (all labs ordered are listed, but only abnormal results are displayed) Labs Reviewed   BASIC METABOLIC PANEL - Abnormal; Notable for the following components:      Result Value   Sodium 131 (*)    Chloride 97 (*)    Glucose, Bld 172 (*)    Calcium 8.3 (*)    All other components within normal limits  CBC - Abnormal; Notable for the following components:   WBC 13.9 (*)    RBC 4.03 (*)    Hemoglobin 12.2 (*)    HCT 36.6 (*)    All other components within normal limits  HEPATIC FUNCTION PANEL - Abnormal; Notable for the following components:   Albumin 3.4 (*)    All other components within normal limits  LIPASE, BLOOD - Abnormal; Notable for the following components:   Lipase 366 (*)    All other components within normal limits  CBG MONITORING, ED - Abnormal; Notable for the following components:   Glucose-Capillary 115 (*)    All other components within normal limits  CBG MONITORING, ED - Abnormal; Notable for the following components:   Glucose-Capillary 121 (*)    All other components within normal limits  RESP PANEL BY RT-PCR (  RSV, FLU A&B, COVID)  RVPGX2  CULTURE, BLOOD (ROUTINE X 2)  CULTURE, BLOOD (ROUTINE X 2)  LACTIC ACID, PLASMA  LACTIC ACID, PLASMA  HIV ANTIBODY (ROUTINE TESTING W REFLEX)  COMPREHENSIVE METABOLIC PANEL  CBC  TROPONIN I (HIGH SENSITIVITY)  TROPONIN I (HIGH SENSITIVITY)     EKG EKG independently reviewed interpreted by myself (ER attending) demonstrates:  EKG demonstrate sinus tachycardia at a rate of 102, PR 118, QRS 84, QTc 427, no acute ST changes  RADIOLOGY Imaging independently reviewed and interpreted by myself demonstrates:  CXR without focal consolidation Right upper quadrant ultrasound concerning for cholecystitis MRCP redemonstrates cholecystitis without biliary obstruction  PROCEDURES:  Critical Care performed: Yes, see critical care procedure note(s)  CRITICAL CARE Performed by: Trinna Post   Total critical care time: 35 minutes  Critical care time was exclusive of separately billable procedures and treating  other patients.  Critical care was necessary to treat or prevent imminent or life-threatening deterioration.  Critical care was time spent personally by me on the following activities: development of treatment plan with patient and/or surrogate as well as nursing, discussions with consultants, evaluation of patient's response to treatment, examination of patient, obtaining history from patient or surrogate, ordering and performing treatments and interventions, ordering and review of laboratory studies, ordering and review of radiographic studies, pulse oximetry and re-evaluation of patient's condition.   Procedures   MEDICATIONS ORDERED IN ED: Medications  lactated ringers infusion ( Intravenous New Bag/Given 02/15/23 2321)  acetaminophen (TYLENOL) tablet 1,000 mg (1,000 mg Oral Given 02/15/23 2338)  HYDROmorphone (DILAUDID) injection 0.5 mg (has no administration in time range)  polyethylene glycol (MIRALAX / GLYCOLAX) packet 17 g (has no administration in time range)  ondansetron (ZOFRAN-ODT) disintegrating tablet 4 mg (has no administration in time range)    Or  ondansetron (ZOFRAN) injection 4 mg (has no administration in time range)  pantoprazole (PROTONIX) injection 40 mg (40 mg Intravenous Given 02/15/23 2223)  piperacillin-tazobactam (ZOSYN) IVPB 3.375 g (has no administration in time range)  insulin aspart (novoLOG) injection 0-15 Units (2 Units Subcutaneous Given 02/15/23 2338)  QUEtiapine (SEROQUEL) tablet 25 mg (25 mg Oral Given 02/15/23 2223)  amLODipine (NORVASC) tablet 10 mg (has no administration in time range)  ipratropium-albuterol (DUONEB) 0.5-2.5 (3) MG/3ML nebulizer solution 3 mL (3 mLs Nebulization Given 02/15/23 2338)  magic mouthwash w/lidocaine (has no administration in time range)  Oral care mouth rinse (has no administration in time range)  chlorpheniramine-HYDROcodone (TUSSIONEX) 10-8 MG/5ML suspension 5 mL (5 mLs Oral Given 02/15/23 2344)  sodium chloride 0.9 % bolus  1,000 mL (0 mLs Intravenous Stopped 02/15/23 1950)  morphine (PF) 4 MG/ML injection 4 mg (4 mg Intravenous Given 02/15/23 1729)  piperacillin-tazobactam (ZOSYN) IVPB 3.375 g (0 g Intravenous Stopped 02/15/23 2102)  morphine (PF) 4 MG/ML injection 4 mg (4 mg Intravenous Given 02/15/23 1959)  gadobutrol (GADAVIST) 1 MMOL/ML injection 10 mL (10 mLs Intravenous Contrast Given 02/15/23 2051)     IMPRESSION / MDM / ASSESSMENT AND PLAN / ED COURSE  I reviewed the triage vital signs and the nursing notes.  Differential diagnosis includes, but is not limited to, cholecystitis, biliary colic, pancreatitis, pneumonia  Patient's presentation is most consistent with acute presentation with potential threat to life or bodily function.  72 year old male presenting to the ER for evaluation of upper abdominal pain found to be borderline febrile on presentation.  Labs with leukocytosis WBC of 13.9, mild anemia 12.2.  Lipase elevated at 366, but overall reassuring LFTs.  Right upper quadrant ultrasound ordered.  Clinical Course as of 02/16/23 0006  Sat Feb 15, 2023  1900 US Abdomen Limited RUQ (LIVER/GB) Ultrasound concerning for acute cholecystitis.  Interestingly, patient has an elevated lipase, but normal LFTs and bilirubin.  Reviewed with Dr. Aleen Campi.  Recommends MRCP to determine if patient has biliary stone, Zosyn, and reconsultation after completion of MRCP. [NR]  2125 MR ABDOMEN MRCP W WO CONTAST MRCP redemonstrates findings of acute cholecystitis, no biliary stone noted.  Will recontact surgery team. [NR]  2140 Reviewed with Dr. Aleen Campi.  He will admit the patient for anticipated intervention tomorrow. [NR]    Clinical Course User Index [NR] Trinna Post, MD     FINAL CLINICAL IMPRESSION(S) / ED DIAGNOSES   Final diagnoses:  Acute cholecystitis  Acute pancreatitis, unspecified complication status, unspecified pancreatitis type     Rx / DC Orders   ED Discharge Orders     None         Note:  This document was prepared using Dragon voice recognition software and may include unintentional dictation errors.   Trinna Post, MD 02/16/23 [id number]

## 2023-02-16 DIAGNOSIS — K859 Acute pancreatitis without necrosis or infection, unspecified: Secondary | ICD-10-CM

## 2023-02-16 DIAGNOSIS — F0390 Unspecified dementia without behavioral disturbance: Secondary | ICD-10-CM | POA: Insufficient documentation

## 2023-02-16 DIAGNOSIS — K810 Acute cholecystitis: Secondary | ICD-10-CM

## 2023-02-16 DIAGNOSIS — J441 Chronic obstructive pulmonary disease with (acute) exacerbation: Secondary | ICD-10-CM | POA: Diagnosis not present

## 2023-02-16 HISTORY — DX: Unspecified dementia, unspecified severity, without behavioral disturbance, psychotic disturbance, mood disturbance, and anxiety: F03.90

## 2023-02-16 LAB — CBC
HCT: 32.5 % — ABNORMAL LOW (ref 39.0–52.0)
Hemoglobin: 10.7 g/dL — ABNORMAL LOW (ref 13.0–17.0)
MCH: 30.7 pg (ref 26.0–34.0)
MCHC: 32.9 g/dL (ref 30.0–36.0)
MCV: 93.1 fL (ref 80.0–100.0)
Platelets: 196 10*3/uL (ref 150–400)
RBC: 3.49 MIL/uL — ABNORMAL LOW (ref 4.22–5.81)
RDW: 14.5 % (ref 11.5–15.5)
WBC: 6.1 10*3/uL (ref 4.0–10.5)
nRBC: 0 % (ref 0.0–0.2)

## 2023-02-16 LAB — COMPREHENSIVE METABOLIC PANEL
ALT: 16 U/L (ref 0–44)
AST: 21 U/L (ref 15–41)
Albumin: 2.9 g/dL — ABNORMAL LOW (ref 3.5–5.0)
Alkaline Phosphatase: 63 U/L (ref 38–126)
Anion gap: 8 (ref 5–15)
BUN: 12 mg/dL (ref 8–23)
CO2: 28 mmol/L (ref 22–32)
Calcium: 8.2 mg/dL — ABNORMAL LOW (ref 8.9–10.3)
Chloride: 101 mmol/L (ref 98–111)
Creatinine, Ser: 1.16 mg/dL (ref 0.61–1.24)
GFR, Estimated: >60 mL/min (ref >60)
Glucose, Bld: 127 mg/dL — ABNORMAL HIGH (ref 70–99)
Potassium: 4 mmol/L (ref 3.5–5.1)
Sodium: 137 mmol/L (ref 135–145)
Total Bilirubin: 0.7 mg/dL (ref 0.3–1.2)
Total Protein: 6.8 g/dL (ref 6.5–8.1)

## 2023-02-16 LAB — TROPONIN I (HIGH SENSITIVITY): Troponin I (High Sensitivity): 12 ng/L (ref <18)

## 2023-02-16 LAB — LIPASE, BLOOD: Lipase: 155 U/L — ABNORMAL HIGH (ref 11–51)

## 2023-02-16 LAB — GLUCOSE, CAPILLARY
Glucose-Capillary: 130 mg/dL — ABNORMAL HIGH (ref 70–99)
Glucose-Capillary: 119 mg/dL — ABNORMAL HIGH (ref 70–99)
Glucose-Capillary: 110 mg/dL — ABNORMAL HIGH (ref 70–99)

## 2023-02-16 LAB — CBG MONITORING, ED
Glucose-Capillary: 120 mg/dL — ABNORMAL HIGH (ref 70–99)
Glucose-Capillary: 113 mg/dL — ABNORMAL HIGH (ref 70–99)
Glucose-Capillary: 100 mg/dL — ABNORMAL HIGH (ref 70–99)

## 2023-02-16 LAB — HIV ANTIBODY (ROUTINE TESTING W REFLEX): HIV Screen 4th Generation wRfx: NONREACTIVE

## 2023-02-16 MED ORDER — SODIUM CHLORIDE 0.9 % IV SOLN: INTRAVENOUS | Status: DC | PRN

## 2023-02-16 MED ORDER — ALBUTEROL SULFATE (2.5 MG/3ML) 0.083% IN NEBU
2.5000 mg | INHALATION_SOLUTION | RESPIRATORY_TRACT | Status: DC | PRN
  Administered 2023-02-16: 2.5 mg via RESPIRATORY_TRACT
  Filled 2023-02-16: qty 3

## 2023-02-16 MED ORDER — IPRATROPIUM-ALBUTEROL 0.5-2.5 (3) MG/3ML IN SOLN
3.0000 mL | Freq: Four times a day (QID) | RESPIRATORY_TRACT | Status: DC
  Administered 2023-02-16 – 2023-02-18 (×11): 3 mL via RESPIRATORY_TRACT
  Filled 2023-02-16 (×10): qty 3

## 2023-02-16 MED ORDER — GUAIFENESIN ER 600 MG PO TB12
1200.0000 mg | ORAL_TABLET | Freq: Two times a day (BID) | ORAL | Status: DC
  Administered 2023-02-16 – 2023-02-18 (×6): 1200 mg via ORAL
  Filled 2023-02-16 (×6): qty 2

## 2023-02-16 NOTE — H&P (Signed)
Date of Admission:  02/16/2023  Reason for Admission:  Acute cholecystitis  History of Present Illness: [name] IV is a 72 y.o. male presenting for evaluation of abdominal pain.  The patient was seen recently in the emergency room on 01/30/2023 with upper abdominal pain and was diagnosed with cholelithiasis.  He was actually scheduled to see me in the office as an outpatient tomorrow but presented last night to emergency room again with the same symptoms.  Along with the pain is also associated with some nausea and vomiting.  However he is now also having a cough and congestion some associated shortness of breath.  In the emergency room, initial chest x-ray showed some slight right-sided peribronchial thickening but no significant disease.  Ultrasound was done which showed a gallstone at the neck of the gallbladder with associated wall thickening and distention.  Although his LFTs were normal, his lipase was elevated to 366.  As a precaution, MRCP was also obtained which again showed a gallstone in the gallbladder neck with mild gallbladder wall thickening and pericholecystic fluid consistent with acute cholecystitis.  However there was no choledocholithiasis and no pancreatitis noticeable.  Patient was admitted to the surgical team overnight.  However due to his shortness of breath and coughing, I asked our colleagues with internal medicine to evaluate the patient as well due to concerns for possible pneumonia or bronchitis given his history of COPD.  Dr. Para March evaluated the patient and agreed that due to wheezing congestion and coughing, might be safer to continue to treat acute exacerbation/acute bronchitis and postpone surgery.  Today, the patient reports that he is feeling better with improving pain in the right upper quadrant.  White blood cell count is now normalized down to 6.1 from 13.9 yesterday.  LFTs remain normal and his lipase has decreased to 155.  Past Medical History: Past  Medical History:  Diagnosis Date   Alcohol abuse    Benzodiazepine dependence (HCC)    Benzodiazepine withdrawal (HCC)    Chronic pain in right foot    COPD (chronic obstructive pulmonary disease) (HCC)    Depression 08/19/2017   Hepatitis C    Hypertension    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HCC)    Seizures (HCC) 2010   Sleep apnea    Syncope    questionable vasovagal     Past Surgical History: Past Surgical History:  Procedure Laterality Date   COLONOSCOPY WITH PROPOFOL N/A 12/16/2016   Procedure: COLONOSCOPY WITH PROPOFOL;  Surgeon: Christena Deem, MD;  Location[facility] ENDOSCOPY;  Service: Endoscopy;  Laterality: N/A;   COLONOSCOPY WITH PROPOFOL N/A 03/14/2017   Procedure: COLONOSCOPY WITH PROPOFOL;  Surgeon: Christena Deem, MD;  Location: [facility] ENDOSCOPY;  Service: Endoscopy;  Laterality: N/A;   COLONOSCOPY WITH PROPOFOL     FOOT SURGERY Right 03/12/2002   FOOT SURGERY     HEMORRHOID SURGERY     LITHOTRIPSY     MYRINGOTOMY     MYRINGOTOMY WITH TUBE PLACEMENT Left 04/28/2019   Procedure: MYRINGOTOMY WITH TUBE PLACEMENT;  Surgeon: Bud Face, MD;  Location: [facility] SURGERY CNTR;  Service: ENT;  Laterality: Left;   NASOPHARYNGOSCOPY N/A 04/28/2019   Procedure: NASOPHARYNGOSCOPY WITH BIOPSY;  Surgeon: Bud Face, MD;  Location: [facility] SURGERY CNTR;  Service: ENT;  Laterality: N/A;   NASOPHARYNGOSCOPY     Port a cath Placement     PORTA CATH INSERTION N/A 05/20/2019   Procedure: PORTA CATH INSERTION;  Surgeon: Annice Needy, MD;  Location: [facility] INVASIVE CV LAB;  Service: Cardiovascular;  Laterality: N/A;   PORTA CATH REMOVAL N/A 11/29/2020   Procedure: PORTA CATH REMOVAL;  Surgeon: Annice Needy, MD;  Location: [facility] INVASIVE CV LAB;  Service: Cardiovascular;  Laterality: N/A;   TONSILLECTOMY      Home Medications: Prior to Admission medications   Medication Sig Start Date End Date Taking? Authorizing Provider  alum & mag hydroxide-simeth (MAALOX PLUS) 400-400-40 MG/5ML  suspension Take 15 mLs by mouth every 6 (six) hours as needed for indigestion. 02/05/23  Yes [name], FNP  amLODipine (NORVASC) 10 MG tablet Take 1 tablet (10 mg total) by mouth daily. 01/24/23  Yes [name], FNP  antiseptic oral rinse (BIOTENE) LIQD 15 mLs by Mouth Rinse route as needed for dry mouth.   Yes [provider]  aspirin EC 81 MG tablet Take 81 mg by mouth daily. Swallow whole.   Yes [provider]  buprenorphine (BUTRANS) 15 MCG/HR Place 1 patch onto the skin once a week. 01/26/23 04/20/23 Yes Edward Jolly, MD  Ipratropium-Albuterol (COMBIVENT RESPIMAT) 20-100 MCG/ACT AERS respimat Inhale 1 puff into the lungs every 6 (six) hours as needed. Rescue inhaler 05/09/22  Yes Lyndon Code, MD  magic mouthwash (nystatin, lidocaine, diphenhydrAMINE, alum & mag hydroxide) suspension Swish and swallow 15 mLs 4 (four) times daily as needed for mouth pain. 02/06/23  Yes [name], FNP  nystatin (MYCOSTATIN) 100000 UNIT/ML suspension Take 5 mLs by mouth daily. 02/06/23  Yes [provider]  omeprazole (PRILOSEC) 40 MG capsule TAKE 1 CAPSULE BY MOUTH ONCE DAILY 10/23/22  Yes Drubel, Lillia Abed, PA-C  QUEtiapine (SEROQUEL) 25 MG tablet Take 1 tablet (25 mg total) by mouth at bedtime. 01/24/23  Yes [name], FNP  rosuvastatin (CRESTOR) 40 MG tablet Take 1 tablet (40 mg total) by mouth daily. 01/24/23  Yes Merita Norton T, FNP  Blood Glucose Monitoring Suppl DEVI 1 each by Does not apply route in the morning, at noon, and at bedtime. May substitute to any manufacturer covered by patient's insurance. Pt's current glucometer is broken. 10/28/22   Alfredia Ferguson, PA-C    Allergies: Allergies  Allergen Reactions   Opana [Oxymorphone Hcl]     Made him BLACKOUT   Elemental Sulfur     Childhood reaction    Oxymorphone Other (See Comments)   Oxymorphone    Sulfa Antibiotics Other (See Comments)   Sulfa Antibiotics     Social History:  reports that he quit  smoking about 10 months ago. His smoking use included cigarettes. He started smoking about 50 years ago. He has a 25 pack-year smoking history. He has been exposed to tobacco smoke. He has never used smokeless tobacco. He reports that he does not currently use alcohol after a past usage of about 14.0 standard drinks of alcohol per week. He reports that he does not currently use drugs.   Family History: Family History  Problem Relation Age of Onset   Arthritis Mother    Hypertension Mother    Cancer Father    Kidney disease Father    Alcohol abuse Paternal Aunt    Depression Maternal Grandfather     Review of Systems: Review of Systems  Constitutional:  Negative for chills and fever.  HENT:  Negative for hearing loss.   Respiratory:  Positive for cough and shortness of breath.   Cardiovascular:  Negative for chest pain.  Gastrointestinal:  Positive for abdominal pain, nausea and vomiting.  Genitourinary:  Negative for dysuria.  Musculoskeletal:  Negative for myalgias.  Skin:  Negative for rash.  Neurological:  Negative for dizziness.  Psychiatric/Behavioral:  Negative for depression.     Physical Exam BP 133/72 (BP Location: Right Arm)   Pulse 84   Temp 97.7 F (36.5 C) (Oral)   Resp 18   Ht 5\' 9"  (1.753 m)   Wt 99.8 kg   SpO2 100%   BMI 32.49 kg/m  CONSTITUTIONAL: No acute distress HEENT:  Normocephalic, atraumatic, extraocular motion intact. NECK: Trachea is midline, and there is no jugular venous distension.  RESPIRATORY:  Normal respiratory effort without pathologic use of accessory muscles. CARDIOVASCULAR: Regular rhythm and rate. GI: The abdomen is soft, nondistended, with some tenderness to palpation of the right upper quadrant.  Negative Murphy sign at this point.  MUSCULOSKELETAL:  Normal muscle strength and tone in all four extremities.  No peripheral edema or cyanosis. SKIN: Skin turgor is normal. There are no pathologic skin lesions.  NEUROLOGIC:  Motor and  sensation is grossly normal.  Cranial nerves are grossly intact. PSYCH:  Alert and oriented to person, place and time. Affect is normal.  Laboratory Analysis: Results for orders placed or performed during the hospital encounter of 02/15/23 (from the past 24 hour(s))  Lactic acid, plasma     Status: None   Collection Time: 02/15/23  7:44 PM  Result Value Ref Range   Lactic Acid, Venous 1.2 0.5 - 1.9 mmol/L  Blood Culture (routine x 2)     Status: None (Preliminary result)   Collection Time: 02/15/23  7:44 PM   Specimen: Right Antecubital; Blood  Result Value Ref Range   Specimen Description RIGHT ANTECUBITAL    Special Requests      BOTTLES DRAWN AEROBIC AND ANAEROBIC Blood Culture adequate volume   Culture      NO GROWTH < 12 HOURS Performed at [facility], [address] [place], [place] [address]    Report Status PENDING   Lactic acid, plasma     Status: None   Collection Time: 02/15/23  9:06 PM  Result Value Ref Range   Lactic Acid, Venous 1.2 0.5 - 1.9 mmol/L  HIV Antibody (routine testing w rflx)     Status: None   Collection Time: 02/15/23 10:23 PM  Result Value Ref Range   HIV Screen 4th Generation wRfx Non Reactive Non Reactive  CBG monitoring, ED     Status: Abnormal   Collection Time: 02/15/23 10:31 PM  Result Value Ref Range   Glucose-Capillary 115 (H) 70 - 99 mg/dL  CBG monitoring, ED     Status: Abnormal   Collection Time: 02/15/23 11:25 PM  Result Value Ref Range   Glucose-Capillary 121 (H) 70 - 99 mg/dL  CBG monitoring, ED     Status: Abnormal   Collection Time: 02/16/23  4:46 AM  Result Value Ref Range   Glucose-Capillary 120 (H) 70 - 99 mg/dL  Troponin I (High Sensitivity)     Status: None   Collection Time: 02/16/23  4:47 AM  Result Value Ref Range   Troponin I (High Sensitivity) 12 <18 ng/L  Comprehensive metabolic panel     Status: Abnormal   Collection Time: 02/16/23  4:47 AM  Result Value Ref Range   Sodium 137 135 - 145 mmol/L    Potassium 4.0 3.5 - 5.1 mmol/L   Chloride 101 98 - 111 mmol/L   CO2 28 22 - 32 mmol/L   Glucose, Bld 127 (H) 70 - 99 mg/dL   BUN 12  8 - 23 mg/dL   Creatinine, Ser 1.61 0.61 - 1.24 mg/dL   Calcium 8.2 (L) 8.9 - 10.3 mg/dL   Total Protein 6.8 6.5 - 8.1 g/dL   Albumin 2.9 (L) 3.5 - 5.0 g/dL   AST 21 15 - 41 U/L   ALT 16 0 - 44 U/L   Alkaline Phosphatase 63 38 - 126 U/L   Total Bilirubin 0.7 0.3 - 1.2 mg/dL   GFR, Estimated >09 >60 mL/min   Anion gap 8 5 - 15  CBC     Status: Abnormal   Collection Time: 02/16/23  4:47 AM  Result Value Ref Range   WBC 6.1 4.0 - 10.5 K/uL   RBC 3.49 (L) 4.22 - 5.81 MIL/uL   Hemoglobin 10.7 (L) 13.0 - 17.0 g/dL   HCT 45.4 (L) 09.8 - 11.9 %   MCV 93.1 80.0 - 100.0 fL   MCH 30.7 26.0 - 34.0 pg   MCHC 32.9 30.0 - 36.0 g/dL   RDW 14.7 82.9 - 56.2 %   Platelets 196 150 - 400 K/uL   nRBC 0.0 0.0 - 0.2 %  Lipase, blood     Status: Abnormal   Collection Time: 02/16/23  4:47 AM  Result Value Ref Range   Lipase 155 (H) 11 - 51 U/L  CBG monitoring, ED     Status: Abnormal   Collection Time: 02/16/23  8:38 AM  Result Value Ref Range   Glucose-Capillary 113 (H) 70 - 99 mg/dL  CBG monitoring, ED     Status: Abnormal   Collection Time: 02/16/23 12:15 PM  Result Value Ref Range   Glucose-Capillary 100 (H) 70 - 99 mg/dL  Glucose, capillary     Status: Abnormal   Collection Time: 02/16/23  4:00 PM  Result Value Ref Range   Glucose-Capillary 130 (H) 70 - 99 mg/dL    Imaging: MR ABDOMEN MRCP W WO CONTAST  Result Date: 02/15/2023 CLINICAL DATA:  Cholelithiasis, cholecystitis, elevated lipase, evaluate for biliary calculus EXAM: MRI ABDOMEN WITHOUT AND WITH CONTRAST (INCLUDING MRCP) TECHNIQUE: Multiplanar multisequence MR imaging of the abdomen was performed both before and after the administration of intravenous contrast. Heavily T2-weighted images of the biliary and pancreatic ducts were obtained, and three-dimensional MRCP images were rendered by post  processing. CONTRAST:  10mL GADAVIST GADOBUTROL 1 MMOL/ML IV SOLN COMPARISON:  Right upper quadrant ultrasound, 02/15/2023 FINDINGS: Lower chest: No acute abnormality. Hepatobiliary: No solid liver abnormality is seen. Large gallstone in the gallbladder. Mildly distended gallbladder with mild gallbladder wall thickening and pericholecystic fluid. No biliary ductal dilatation. Pancreas: Unremarkable. No pancreatic ductal dilatation or surrounding inflammatory changes. Spleen: Normal in size without significant abnormality. Adrenals/Urinary Tract: Adrenal glands are unremarkable. Kidneys are normal, without renal calculi, solid lesion, or hydronephrosis. Stomach/Bowel: Stomach is within normal limits. No evidence of bowel wall thickening, distention, or inflammatory changes. Vascular/Lymphatic: Aortic atherosclerosis. No enlarged abdominal lymph nodes. Other: No abdominal wall hernia or abnormality. Trace perihepatic ascites. Musculoskeletal: No acute or significant osseous findings. IMPRESSION: 1. Large gallstone in the gallbladder. Mildly distended gallbladder with mild gallbladder wall thickening and pericholecystic fluid. Findings are consistent with acute cholecystitis. 2. No biliary ductal dilatation or evidence of choledocholithiasis. 3. Trace perihepatic ascites. Electronically Signed   By: Jearld Lesch M.D.   On: 02/15/2023 21:20   MR 3D Recon At Scanner  Result Date: 02/15/2023 CLINICAL DATA:  Cholelithiasis, cholecystitis, elevated lipase, evaluate for biliary calculus EXAM: MRI ABDOMEN WITHOUT AND WITH CONTRAST (INCLUDING MRCP) TECHNIQUE: Multiplanar multisequence  MR imaging of the abdomen was performed both before and after the administration of intravenous contrast. Heavily T2-weighted images of the biliary and pancreatic ducts were obtained, and three-dimensional MRCP images were rendered by post processing. CONTRAST:  10mL GADAVIST GADOBUTROL 1 MMOL/ML IV SOLN COMPARISON:  Right upper quadrant  ultrasound, 02/15/2023 FINDINGS: Lower chest: No acute abnormality. Hepatobiliary: No solid liver abnormality is seen. Large gallstone in the gallbladder. Mildly distended gallbladder with mild gallbladder wall thickening and pericholecystic fluid. No biliary ductal dilatation. Pancreas: Unremarkable. No pancreatic ductal dilatation or surrounding inflammatory changes. Spleen: Normal in size without significant abnormality. Adrenals/Urinary Tract: Adrenal glands are unremarkable. Kidneys are normal, without renal calculi, solid lesion, or hydronephrosis. Stomach/Bowel: Stomach is within normal limits. No evidence of bowel wall thickening, distention, or inflammatory changes. Vascular/Lymphatic: Aortic atherosclerosis. No enlarged abdominal lymph nodes. Other: No abdominal wall hernia or abnormality. Trace perihepatic ascites. Musculoskeletal: No acute or significant osseous findings. IMPRESSION: 1. Large gallstone in the gallbladder. Mildly distended gallbladder with mild gallbladder wall thickening and pericholecystic fluid. Findings are consistent with acute cholecystitis. 2. No biliary ductal dilatation or evidence of choledocholithiasis. 3. Trace perihepatic ascites. Electronically Signed   By: Jearld Lesch M.D.   On: 02/15/2023 21:20   US Abdomen Limited RUQ (LIVER/GB)  Result Date: 02/15/2023 CLINICAL DATA:  [id number] RUQ abdominal pain [id number] EXAM: ULTRASOUND ABDOMEN LIMITED RIGHT UPPER QUADRANT COMPARISON:  01/30/2023. FINDINGS: Gallbladder: Mildly distended. Shadowing gallstone at the neck. Gallbladder wall thickening. No pericholecystic fluid Common bile duct: Diameter: 0.5 cm. Liver: Liver is hyperechoic consistent with fatty infiltration. No focal hepatic lesions identified. No intrahepatic ductal dilatation. Hepatopetal portal vein. IMPRESSION: 1. Hepatic fatty infiltration. 2. Findings suggest acute cholecystitis. This can be confirmed with a HIDA scan if indicated. Electronically Signed   By: Layla Maw M.D.   On: 02/15/2023 18:21    Assessment and Plan: This is a 72 y.o. male with acute cholecystitis in the setting of acute bronchitis/COPD exacerbation.  - Discussed with the patient that given his bronchitis, undergoing general anesthesia with intubation would potentially make this worse and can component or worsening pneumonia.  Given that he is feeling better today, I think it would be prudent to postpone any surgical intervention at this point and continue conservative measures for his acute cholecystitis.  Discussed with him that if there is any worsening clinically, we may have to discuss with interventional radiology about percutaneous cholecystostomy drain placement.  For now however we can start him on a clear liquid diet, continue IV antibiotics, continue appropriate pain/nausea control, continue treatment of his cough and home medications. - Will repeat labs in the morning and continue evaluating the patient for improvement.  I spent 75 minutes dedicated to the care of this patient on the date of this encounter to include pre-visit review of records, face-to-face time with the patient discussing diagnosis and management, and any post-visit coordination of care.   Howie Ill, MD [facility]:  [phone]

## 2023-02-16 NOTE — Assessment & Plan Note (Addendum)
Mood disorder Continue quetiapine

## 2023-02-16 NOTE — Evaluation (Signed)
Physical Therapy Evaluation Patient Details Name: [name] MRN: [id number] DOB: [birth year] Today's Date: 02/16/2023  History of Present Illness  [name] is a 72 y.o. 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  Patient states that he has been coughing for the past week and then 3 days ago he started wheezing in the coughing seemed to get worse to where he thought he had COVID.  He denies chest pain.  Clinical Impression  The pt is presenting this session near his baseline level of function. The pt demonstrates ability to complete short distance ambulation without O2 maintaining saturation at >93%. The pt reports ambulating with SPC at baseline. He is currently not demonstrating any new impairments to functional mobility. Mobility specialist consulted to maintain functional mobility until medical procedure. Please reconsult, if needed, following procedure.         If plan is discharge home, recommend the following:     Can travel by private vehicle        Equipment Recommendations    Recommendations for Other Services       Functional Status Assessment Patient has not had a recent decline in their functional status     Precautions / Restrictions Precautions Precautions: Fall Restrictions Weight Bearing Restrictions: No      Mobility  Bed Mobility Overal bed mobility: Independent                   Transfers                        Ambulation/Gait Ambulation/Gait assistance: Modified independent (Device/Increase time) Gait Distance (Feet): 10 Feet Assistive device: Straight cane         General Gait Details: Gait distance limited by consistent cough and pain with coughing  Stairs            Wheelchair Mobility     Tilt Bed    Modified Rankin (Stroke Patients Only)       Balance                                             Pertinent Vitals/Pain Pain Assessment Pain Assessment: Faces Pain Score: 2     Home Living Family/patient expects to be discharged to:: Private residence Living Arrangements: Alone   Type of Home: Apartment Home Access: Elevator       Home Layout: One level        Prior Function Prior Level of Function : Independent/Modified Independent                     Extremity/Trunk Assessment                Communication      Cognition Arousal: Alert Behavior During Therapy: WFL for tasks assessed/performed Overall Cognitive Status: Within Functional Limits  for tasks assessed                                          General Comments      Exercises     Assessment/Plan    PT Assessment Patient needs continued PT services  PT Problem List Cardiopulmonary status limiting activity       PT Treatment Interventions Therapeutic activities;Patient/family education;Functional mobility training    PT Goals (Current goals can be found in the Care Plan section)  Acute Rehab PT Goals Patient Stated Goal: Return home after procedure PT Goal Formulation: With patient Time For Goal Achievement: 03/02/23 Potential to Achieve Goals: Good    Frequency Min 2X/week     Co-evaluation               AM-PAC PT "6 Clicks" Mobility  Outcome Measure Help needed turning from your back to your side while in a flat bed without using bedrails?: None Help  needed moving from lying on your back to sitting on the side of a flat bed without using bedrails?: None Help needed moving to and from a bed to a chair (including a wheelchair)?: None Help needed standing up from a chair using your arms (e.g., wheelchair or bedside chair)?: None Help needed to walk in hospital room?: None Help needed climbing 3-5 steps with a railing? : A Little 6 Click Score: 23    End of Session Equipment Utilized During Treatment: Oxygen Activity Tolerance: Patient tolerated treatment well;Patient limited by fatigue Patient left: in bed;with nursing/sitter in room Nurse Communication: Mobility status PT Visit Diagnosis: Unsteadiness on feet (R26.81);Difficulty in walking, not elsewhere classified (R26.2)    Time: 0102-7253 PT Time Calculation (min) (ACUTE ONLY): 23 min   Charges:   PT Evaluation $PT Eval Low Complexity: 1 Low PT Treatments $Gait Training: 8-22 mins PT General Charges $$ ACUTE PT VISIT: 1 Visit         11:26 AM, 02/16/23 Eleftherios Dudenhoeffer A. Mordecai Maes PT, DPT Physical Therapist - [facility]   Alleigh Mollica A Zykia Walla 02/16/2023, 11:24 AM

## 2023-02-16 NOTE — Assessment & Plan Note (Signed)
Hydralazine as needed

## 2023-02-16 NOTE — Progress Notes (Signed)
Patient is seen and examined today morning.  He is admitted to surgery service for acute cholecystitis with plan for surgical intervention.  Patient is seen for medical consultation.  He does have COPD exacerbation and is currently receiving DuoNebs, supplemental oxygen, Mucinex.  During my exam, he does have diffuse wheezes, able to move air.  He has dry cough.  States he quit smoking 2 weeks ago.  Advised incentive spirometry, out of bed to chair.   He probably will need 1 to 2 days prior to medical optimization for surgical intervention.  Further management as per surgery team.

## 2023-02-16 NOTE — Assessment & Plan Note (Addendum)
Chronic respiratory failure with hypoxia Patient had wheezing and coughing while in the ED treated with Tussionex DuoNebs Chest x-ray showing right peribronchial thickening.   Respiratory viral panel negative for COVID flu and RSV Continue scheduled and as needed nebulized bronchodilators Will hold off steroids due to acute infection Flutter valve and incentive spirometer Mucinex Supplemental O2 to keep sats over 90 (patient has documented history of chronic respiratory failure however states he does not use oxygen at home) Reassessment: Patient was treated with a couple rounds of DuoNebs/albuterol, given guaifenesin and on reassessment about 3 hours later, still tight, moving little air and wheezing though does not appear distressed Discussed with Dr. Aleen Campi : might be safer to continue to treat acute exacerbation/acute bronchitis and post bone surgery until improved, if at all possible, but will defer to anesthesiology assessment.

## 2023-02-16 NOTE — Assessment & Plan Note (Signed)
Completed chemoradiation in March 2021 Followed yearly by oncology

## 2023-02-16 NOTE — Progress Notes (Signed)
OT Cancellation Note  Patient Details Name: ABOU STERKEL MRN: [id number] DOB: [birth year]   Cancelled Treatment:    Reason Eval/Treat Not Completed: OT screened, no needs identified, will sign off. Pt received in bed, pleasant. Per pt report, pt IND for ADLs and declines OT eval at this time. He feels like he is at his baseline with mobility using SPC, and has not had a recent functional decline warranting skilled OT services. Mobility specialist has been consulted by PT for functional mobility - encouraged pt to participate in OOB mobility as able. Please re-consult if new functional deficits arise for skilled OT intervention.   Shimshon Narula L. Demisha Nokes, OTR/L  02/16/23, 3:28 PM

## 2023-02-16 NOTE — ED Notes (Signed)
Advised nurse that patient has ready bed 

## 2023-02-16 NOTE — Assessment & Plan Note (Addendum)
Preoperative clearance Management per surgery Preoperative clearance pending improvement in acute COPD exacerbation

## 2023-02-16 NOTE — Discharge Instructions (Signed)
 Agency Name: [facility] Address: [address] [place], [place] [address] Phone: [phone] Website: [email or web address] Service(s) Offered: Housing services, self-sufficiency, congregate meal program, and individual development account program.  Agency Name: [employer] Address: [address], [place], [place] [address] Phone: [phone] Email: [email or web address] Website: [email or web address] Service(s) Offered: Housing the homeless, feeding the hungry, Company secretary, job and education related services.  Agency Name: [facility] Address: [address] [place], [place] [address] Phone: [phone] Email: [email or web address] Service(s) Offered: Counseling, problem pregnancy, advocacy for Hispanics, limited emergency financial assistance.  Agency Name: [employer] Address: [address] [place], [place] [address] Phone: [phone] Website: [email or web address] Service(s) Offered: Child support services; child welfare services; SNAP; Medicaid; work first family assistance; and aid with fuel,  rent, food and medicine.  Agency Name: [employer] Address: [address], [place], [place] [address] Phone: [phone] or [phone] Email: [email or web address] Service(s) Offered: Family services and transient assistance; emergency food, fuel, clothing, limited furniture, utilities; budget counseling, general counseling; give a kid a coat; thrift store; Christmas food and toys. Utility assistance, food pantry, rental  assistance, life sustaining medicine

## 2023-02-16 NOTE — Assessment & Plan Note (Addendum)
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

## 2023-02-16 NOTE — Consult Note (Signed)
Initial Consultation Note   Patient: [name]:[id number] DOB: Aug 06, 1950 PCP: [name], FNP DOA: 02/15/2023 DOS: the patient was seen and examined on 02/16/2023 Primary service: Henrene Dodge, MD  Referring physician: Dr Aleen Campi Reason for consult: medical clearance  Assessment/Plan: Assessment and Plan: * Acute cholecystitis Preoperative clearance Management per surgery Preoperative clearance pending improvement in acute COPD exacerbation  COPD with acute exacerbation (HCC) Chronic respiratory failure with hypoxia Patient had wheezing and coughing while in the ED treated with Tussionex DuoNebs Chest x-ray showing right peribronchial thickening.   Respiratory viral panel negative for COVID flu and RSV Continue scheduled and as needed nebulized bronchodilators Will hold off steroids due to acute infection Flutter valve and incentive spirometer Mucinex Supplemental O2 to keep sats over 90 (patient has documented history of chronic respiratory failure however states he does not use oxygen at home) Reassessment: Patient was treated with a couple rounds of DuoNebs/albuterol, given guaifenesin and on reassessment about 3 hours later, still tight, moving little air and wheezing though does not appear distressed Discussed with Dr. Aleen Campi : might be safer to continue to treat acute exacerbation/acute bronchitis and post bone surgery until improved, if at all possible, but will defer to anesthesiology assessment.  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  HPI: [name] is a 72 y.o. male with past medical history of COPD with chronic respiratory failure with prior documentation of home O2 at 2 L(patient not using), , diabetes, HTN, , nasopharyngeal carcinoma completed chemoradiation 07/2019, as well as history of dementia, diagnosed during last hospitalization in December 2023 when he was ruled out for stroke with MRI, and currently admitted to the surgical service on 02/15/2023 with acute cholecystitis (elevated lipase but no biliary stone on MRCP) with plans for cholecystectomy on 02/16/2023, who I was asked to see in consultation for management of medical problems, specifically for medical clearance due to concerns for wheezing and hypoxia.  Patient states that he has been coughing for the past week and then 3 days ago he started wheezing in the coughing seemed to get worse to where he thought he had COVID.  He denies chest pain. ED course and data review: Patient was febrile to 100.3 on arrival with heart rate in the 90s and low 100s, normal blood pressure but was having desaturations to 89% on room air for which she was placed on O2 at 2 L. Labs showed elevated lipase of 366, WBC 13,000, LFTs WNL. Respiratory viral panel negative for COVID flu and RSV Hemoglobin 12.2.  EKG, personally viewed and interpreted showing sinus tachycardia at a 102 with no acute ST-T wave changes. Chest x-ray showed slight right-sided peribronchial thickening otherwise no acute cardiopulmonary disease Workup as mentioned above which included right upper quadrant ultrasound and MRCP was consistent with acute cholecystitis and patient was placed on Zosyn.  Patient was noted to be coughing and wheezing while in the ED and was treated with Tussionex for cough and given DuoNeb x 1 for wheezing..  Review of Systems: As mentioned in the history of present illness. All other systems reviewed and are negative. Past Medical History:  Diagnosis Date   Alcohol abuse  Benzodiazepine  dependence (HCC)    Benzodiazepine withdrawal (HCC)    Chronic pain in right foot    COPD (chronic obstructive pulmonary disease) (HCC)    Depression 08/19/2017   Hepatitis C    Hypertension    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HCC)    Seizures (HCC) 2010   Sleep apnea    Syncope    questionable vasovagal   Past Surgical History:  Procedure Laterality Date   COLONOSCOPY WITH PROPOFOL N/A 12/16/2016   Procedure: COLONOSCOPY WITH PROPOFOL;  Surgeon: Christena Deem, MD;  Location[facility] ENDOSCOPY;  Service: Endoscopy;  Laterality: N/A;   COLONOSCOPY WITH PROPOFOL N/A 03/14/2017   Procedure: COLONOSCOPY WITH PROPOFOL;  Surgeon: Christena Deem, MD;  Location: [facility] ENDOSCOPY;  Service: Endoscopy;  Laterality: N/A;   COLONOSCOPY WITH PROPOFOL     FOOT SURGERY Right 03/12/2002   FOOT SURGERY     HEMORRHOID SURGERY     LITHOTRIPSY     MYRINGOTOMY     MYRINGOTOMY WITH TUBE PLACEMENT Left 04/28/2019   Procedure: MYRINGOTOMY WITH TUBE PLACEMENT;  Surgeon: Bud Face, MD;  Location: [facility]ERY [facility];  Service: ENT;  Laterality: Left;   NASOPHARYNGOSCOPY N/A 04/28/2019   Procedure: NASOPHARYNGOSCOPY WITH BIOPSY;  Surgeon: Bud Face, MD;  Location: [facility] SUR[facility]ERY [facility];  Service: ENT;  Laterality: N/A;   NASOPHARYNGOSCOPY     Port a cath Placement     PORTA CATH INSERTION N/A 05/20/2019   Procedure: PORTA CATH INSERTION;  Surgeon: Annice Needy, MD;  Location: [facility] INVASIVE CV LAB;  Service: Cardiovascular;  Laterality: N/A;   PORTA CATH REMOVAL N/A 11/29/2020   Procedure: PORTA CATH REMOVAL;  Surgeon: Annice Needy, MD;  Location: [facility] INVASIVE CV LAB;  Service: Cardiovascular;  Laterality: N/A;   TONSILLECTOMY     Social History:  reports that he quit smoking about 10 months ago. His smoking use included cigarettes. He started smoking about 50 years ago. He has a 25 pack-year smoking history. He has been exposed to tobacco smoke. He has never used smokeless tobacco. He reports that he  does not currently use alcohol after a past usage of about 14.0 standard drinks of alcohol per week. He reports that he does not currently use drugs.  Allergies  Allergen Reactions   Opana [Oxymorphone Hcl]     Made him BLACKOUT   Elemental Sulfur     Childhood reaction    Oxymorphone Other (See Comments)   Oxymorphone    Sulfa Antibiotics Other (See Comments)   Sulfa Antibiotics     Family History  Problem Relation Age of Onset   Arthritis Mother    Hypertension Mother    Cancer Father    Kidney disease Father    Alcohol abuse Paternal Aunt    Depression Maternal Grandfather     Prior to Admission medications   Medication Sig Start Date End Date Taking? Authorizing Provider  alum & mag hydroxide-simeth (MAALOX PLUS) 400-400-40 MG/5ML suspension Take 15 mLs by mouth every 6 (six) hours as needed for indigestion. 02/05/23  Yes [name], FNP  amLODipine (NORVASC) 10 MG tablet Take 1 tablet (10 mg total) by mouth daily. 01/24/23  Yes [name], FNP  antiseptic oral rinse (BIOTENE) LIQD 15 mLs by Mouth Rinse route as needed for dry mouth.   Yes [provider]  aspirin EC 81 MG tablet Take 81 mg by mouth daily. Swallow whole.   Yes [provider]  buprenorphine (BUTRANS) 15 MCG/HR Place 1 patch onto  the skin once a week. 01/26/23 04/20/23 Yes Edward Jolly, MD  Ipratropium-Albuterol (COMBIVENT RESPIMAT) 20-100 MCG/ACT AERS respimat Inhale 1 puff into the lungs every 6 (six) hours as needed. Rescue inhaler 05/09/22  Yes Lyndon Code, MD  magic mouthwash (nystatin, lidocaine, diphenhydrAMINE, alum & mag hydroxide) suspension Swish and swallow 15 mLs 4 (four) times daily as needed for mouth pain. 02/06/23  Yes [name], FNP  nystatin (MYCOSTATIN) 100000 UNIT/ML suspension Take 5 mLs by mouth daily. 02/06/23  Yes [provider]  omeprazole (PRILOSEC) 40 MG capsule TAKE 1 CAPSULE BY MOUTH ONCE DAILY 10/23/22  Yes Drubel, Lillia Abed, PA-C  QUEtiapine  (SEROQUEL) 25 MG tablet Take 1 tablet (25 mg total) by mouth at bedtime. 01/24/23  Yes [name], FNP  rosuvastatin (CRESTOR) 40 MG tablet Take 1 tablet (40 mg total) by mouth daily. 01/24/23  Yes Merita Norton T, FNP  Blood Glucose Monitoring Suppl DEVI 1 each by Does not apply route in the morning, at noon, and at bedtime. May substitute to any manufacturer covered by patient's insurance. Pt's current glucometer is broken. 10/28/22   Alfredia Ferguson, PA-C    Physical Exam: Vitals:   02/15/23 2200 02/15/23 2345 02/15/23 2346 02/15/23 2347  BP: (!) 143/63   (!) 137/59  Pulse: 96   90  Resp: 18   19  Temp:      TempSrc:      SpO2: 97% 100% 100% 97%  Weight:      Height:       Physical Exam Vitals and nursing note reviewed.  Constitutional:      General: He is not in acute distress.    Comments: Patient actively coughing, congested nonProductive cough  HENT:     Head: Normocephalic and atraumatic.     Nose: Congestion present.  Cardiovascular:     Rate and Rhythm: Normal rate and regular rhythm.     Heart sounds: Normal heart sounds.  Pulmonary:     Effort: Pulmonary effort is normal.     Breath sounds: Wheezing and rhonchi present.  Abdominal:     Palpations: Abdomen is soft.     Tenderness: There is no abdominal tenderness.  Neurological:     Mental Status: Mental status is at baseline.     Data Reviewed: Relevant notes from primary care and specialist visits, past discharge summaries as available in EHR, including Care Everywhere. Prior diagnostic testing as pertinent to current admission diagnoses Updated medications and problem lists for reconciliation ED course, including vitals, labs, imaging, treatment and response to treatment Triage notes, nursing and pharmacy notes and ED provider's notes Notable results as noted in HPI   Family Communication: none Primary team communication: Dr Aleen Campi Thank you very much for involving [place] in the care of your  patient.  Author: Andris Baumann, MD 02/16/2023 12:27 AM  For on call review [email or web address].

## 2023-02-16 NOTE — TOC CM/SW Note (Signed)
Patient is flagged for SDOH utilities.  Resources have been added to the AVS.  Branndon Tuite, LCSW Transitions of Care Department [phone]

## 2023-02-17 ENCOUNTER — Ambulatory Visit: Payer: Medicare HMO | Admitting: Surgery

## 2023-02-17 DIAGNOSIS — K810 Acute cholecystitis: Secondary | ICD-10-CM | POA: Diagnosis not present

## 2023-02-17 DIAGNOSIS — I10 Essential (primary) hypertension: Secondary | ICD-10-CM

## 2023-02-17 DIAGNOSIS — J441 Chronic obstructive pulmonary disease with (acute) exacerbation: Secondary | ICD-10-CM

## 2023-02-17 DIAGNOSIS — F0390 Unspecified dementia without behavioral disturbance: Secondary | ICD-10-CM

## 2023-02-17 DIAGNOSIS — C119 Malignant neoplasm of nasopharynx, unspecified: Secondary | ICD-10-CM

## 2023-02-17 LAB — GLUCOSE, CAPILLARY
Glucose-Capillary: 116 mg/dL — ABNORMAL HIGH (ref 70–99)
Glucose-Capillary: 99 mg/dL (ref 70–99)
Glucose-Capillary: 168 mg/dL — ABNORMAL HIGH (ref 70–99)
Glucose-Capillary: 94 mg/dL (ref 70–99)
Glucose-Capillary: 126 mg/dL — ABNORMAL HIGH (ref 70–99)

## 2023-02-17 LAB — COMPREHENSIVE METABOLIC PANEL
ALT: 16 U/L (ref 0–44)
AST: 21 U/L (ref 15–41)
Albumin: 2.7 g/dL — ABNORMAL LOW (ref 3.5–5.0)
Alkaline Phosphatase: 58 U/L (ref 38–126)
Anion gap: 6 (ref 5–15)
BUN: 10 mg/dL (ref 8–23)
CO2: 30 mmol/L (ref 22–32)
Calcium: 8.3 mg/dL — ABNORMAL LOW (ref 8.9–10.3)
Chloride: 103 mmol/L (ref 98–111)
Creatinine, Ser: 1.13 mg/dL (ref 0.61–1.24)
GFR, Estimated: >60 mL/min (ref >60)
Glucose, Bld: 115 mg/dL — ABNORMAL HIGH (ref 70–99)
Potassium: 4.5 mmol/L (ref 3.5–5.1)
Sodium: 139 mmol/L (ref 135–145)
Total Bilirubin: 0.5 mg/dL (ref 0.3–1.2)
Total Protein: 6.3 g/dL — ABNORMAL LOW (ref 6.5–8.1)

## 2023-02-17 LAB — CBC
HCT: 32.9 % — ABNORMAL LOW (ref 39.0–52.0)
Hemoglobin: 10.9 g/dL — ABNORMAL LOW (ref 13.0–17.0)
MCH: 30.4 pg (ref 26.0–34.0)
MCHC: 33.1 g/dL (ref 30.0–36.0)
MCV: 91.9 fL (ref 80.0–100.0)
Platelets: 197 10*3/uL (ref 150–400)
RBC: 3.58 MIL/uL — ABNORMAL LOW (ref 4.22–5.81)
RDW: 14.6 % (ref 11.5–15.5)
WBC: 3.6 10*3/uL — ABNORMAL LOW (ref 4.0–10.5)
nRBC: 0 % (ref 0.0–0.2)

## 2023-02-17 LAB — LIPASE, BLOOD: Lipase: 108 U/L — ABNORMAL HIGH (ref 11–51)

## 2023-02-17 MED ORDER — CYCLOBENZAPRINE HCL 10 MG PO TABS
5.0000 mg | ORAL_TABLET | Freq: Three times a day (TID) | ORAL | Status: DC | PRN
  Administered 2023-02-17: 5 mg via ORAL
  Filled 2023-02-17: qty 1

## 2023-02-17 MED ORDER — INSULIN ASPART 100 UNIT/ML IJ SOLN: 0.0000 [IU] | Freq: Every day | INTRAMUSCULAR | Status: DC

## 2023-02-17 MED ORDER — ENOXAPARIN SODIUM 60 MG/0.6ML IJ SOSY
0.5000 mg/kg | PREFILLED_SYRINGE | Freq: Every day | INTRAMUSCULAR | Status: DC
  Administered 2023-02-17: 50 mg via SUBCUTANEOUS
  Filled 2023-02-17: qty 0.6

## 2023-02-17 MED ORDER — IBUPROFEN 600 MG PO TABS: 600.0000 mg | ORAL_TABLET | Freq: Three times a day (TID) | ORAL | 1 refills | Status: DC | PRN

## 2023-02-17 MED ORDER — ACETAMINOPHEN 500 MG PO TABS: 1000.0000 mg | ORAL_TABLET | Freq: Four times a day (QID) | ORAL | Status: DC | PRN

## 2023-02-17 MED ORDER — INSULIN ASPART 100 UNIT/ML IJ SOLN: 0.0000 [IU] | Freq: Three times a day (TID) | INTRAMUSCULAR | Status: DC

## 2023-02-17 MED ORDER — PANTOPRAZOLE SODIUM 40 MG PO TBEC
40.0000 mg | DELAYED_RELEASE_TABLET | Freq: Every day | ORAL | Status: DC
  Administered 2023-02-17: 40 mg via ORAL
  Filled 2023-02-17: qty 1

## 2023-02-17 MED ORDER — AMOXICILLIN-POT CLAVULANATE 875-125 MG PO TABS: 1.0000 | ORAL_TABLET | Freq: Two times a day (BID) | ORAL | 0 refills | Status: AC

## 2023-02-17 NOTE — Plan of Care (Signed)

## 2023-02-17 NOTE — TOC CM/SW Note (Addendum)
Transition of Care [facility]) - Inpatient Brief Assessment   Patient Details  Name: [name] MRN: [id number] Date of Birth: 06-25-1950  Transition of Care [facility]) CM/SW Contact:    Chapman Fitch, RN Phone Number: 02/17/2023, 9:17 AM   Clinical Narrative:   Transition of Care [facility]) Screening Note   Patient Details  Name: [name] Date of Birth: 12-21-1950   Transition of Care [facility]) CM/SW Contact:    Chapman Fitch, RN Phone Number: 02/17/2023, 9:17 AM    Transition of Care Department [facility]) has reviewed patient and no TOC needs have been identified at this time. . If new patient transition needs arise, please place a TOC consult.  Patient currently on acute O2    Transition of Care Asessment: Insurance and Status: Insurance coverage has been reviewed Patient has primary care physician: Yes     Prior/Current Home Services: No current home services Social Determinants of Health Reivew: SDOH reviewed no interventions necessary Readmission risk has been reviewed: Yes Transition of care needs: no transition of care needs at this time

## 2023-02-17 NOTE — Progress Notes (Signed)
Progress Note   Patient: [name] NWG:[id number] DOB: 1950/11/09 DOA: 02/15/2023     2 DOS: the patient was seen and examined on 02/17/2023   Brief hospital course: No notes on file  Assessment and Plan: * Acute cholecystitis Preoperative clearance Surgery team plan for conservative management with IV antibiotics and possible plan for interval cholecystectomy in the future. Advance diet as tolerated per primary team.  COPD with acute exacerbation (HCC) Chronic respiratory failure with hypoxia Wheezing improved. Flutter valve and incentive spirometer Mucinex Supplemental O2 to keep sats over 90. Wean o2 as tolerated.  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 Incentive spirometry. Nursing supportive care. Fall, aspiration precautions. DVT prophylaxis   Code Status: Full Code  Subjective: Patient is seen and examined today morning. He is able to tolerate clears. Denies nausea or abdominal discomfort. Did get out of bed. He is on 2 L o2.  Physical Exam: Vitals:   02/17/23 0451 02/17/23 0700 02/17/23 0807 02/17/23 1128  BP: 115/61  119/62   Pulse: 83  82   Resp: 18  18   Temp: 98.2 F (36.8 C)  98 F (36.7 C)   TempSrc: Oral  Oral   SpO2: 96% 95% 96% 93%  Weight:      Height:        General - Elderly Caucasian male, no apparent distress HEENT - PERRLA, EOMI, atraumatic head, non tender sinuses. Lung - Clear, diffuse rhonchi, wheezes. Heart - S1, S2 heard, no murmurs, rubs, trace pedal edema. Abdomen - Soft, non tender, distended, bowel sounds good Neuro - Alert, awake and  oriented x 3, non focal exam. Skin - Warm and dry.  Data Reviewed:      Latest Ref Rng & Units 02/17/2023    6:05 AM 02/16/2023    4:47 AM 02/15/2023    3:41 PM  CBC  WBC 4.0 - 10.5 K/uL 3.6  6.1  13.9   Hemoglobin 13.0 - 17.0 g/dL 57.8  46.9  62.9   Hematocrit 39.0 - 52.0 % 32.9  32.5  36.6   Platelets 150 - 400 K/uL 197  196  250       Latest Ref Rng & Units 02/17/2023    6:05 AM 02/16/2023    4:47 AM 02/15/2023    3:41 PM  BMP  Glucose 70 - 99 mg/dL 528  413  244   BUN 8 - 23 mg/dL 10  12  12    Creatinine 0.61 - 1.24 mg/dL 0.10  2.72  5.36   Sodium 135 - 145 mmol/L 139  137  131   Potassium 3.5 - 5.1 mmol/L 4.5  4.0  4.0   Chloride 98 - 111 mmol/L 103  101  97   CO2 22 - 32 mmol/L 30  28  25    Calcium 8.9 - 10.3 mg/dL 8.3  8.2  8.3    MR ABDOMEN MRCP W WO CONTAST  Result Date: 02/15/2023 CLINICAL DATA:  Cholelithiasis, cholecystitis, elevated lipase, evaluate for biliary calculus EXAM: MRI ABDOMEN WITHOUT AND WITH CONTRAST (INCLUDING MRCP) TECHNIQUE: Multiplanar multisequence MR imaging of the abdomen was performed both before and after the administration of intravenous contrast. Heavily T2-weighted  images of the biliary and pancreatic ducts were obtained, and three-dimensional MRCP images were rendered by post processing. CONTRAST:  10mL GADAVIST GADOBUTROL 1 MMOL/ML IV SOLN COMPARISON:  Right upper quadrant ultrasound, 02/15/2023 FINDINGS: Lower chest: No acute abnormality. Hepatobiliary: No solid liver abnormality is seen. Large gallstone in the gallbladder. Mildly distended gallbladder with mild gallbladder wall thickening and pericholecystic fluid. No biliary ductal dilatation. Pancreas: Unremarkable. No pancreatic ductal dilatation or surrounding inflammatory changes. Spleen: Normal in size without significant abnormality. Adrenals/Urinary Tract: Adrenal glands are unremarkable. Kidneys are normal, without renal calculi, solid lesion, or hydronephrosis. Stomach/Bowel: Stomach is  within normal limits. No evidence of bowel wall thickening, distention, or inflammatory changes. Vascular/Lymphatic: Aortic atherosclerosis. No enlarged abdominal lymph nodes. Other: No abdominal wall hernia or abnormality. Trace perihepatic ascites. Musculoskeletal: No acute or significant osseous findings. IMPRESSION: 1. Large gallstone in the gallbladder. Mildly distended gallbladder with mild gallbladder wall thickening and pericholecystic fluid. Findings are consistent with acute cholecystitis. 2. No biliary ductal dilatation or evidence of choledocholithiasis. 3. Trace perihepatic ascites. Electronically Signed   By: Jearld Lesch M.D.   On: 02/15/2023 21:20   MR 3D Recon At Scanner  Result Date: 02/15/2023 CLINICAL DATA:  Cholelithiasis, cholecystitis, elevated lipase, evaluate for biliary calculus EXAM: MRI ABDOMEN WITHOUT AND WITH CONTRAST (INCLUDING MRCP) TECHNIQUE: Multiplanar multisequence MR imaging of the abdomen was performed both before and after the administration of intravenous contrast. Heavily T2-weighted images of the biliary and pancreatic ducts were obtained, and three-dimensional MRCP images were rendered by post processing. CONTRAST:  10mL GADAVIST GADOBUTROL 1 MMOL/ML IV SOLN COMPARISON:  Right upper quadrant ultrasound, 02/15/2023 FINDINGS: Lower chest: No acute abnormality. Hepatobiliary: No solid liver abnormality is seen. Large gallstone in the gallbladder. Mildly distended gallbladder with mild gallbladder wall thickening and pericholecystic fluid. No biliary ductal dilatation. Pancreas: Unremarkable. No pancreatic ductal dilatation or surrounding inflammatory changes. Spleen: Normal in size without significant abnormality. Adrenals/Urinary Tract: Adrenal glands are unremarkable. Kidneys are normal, without renal calculi, solid lesion, or hydronephrosis. Stomach/Bowel: Stomach is within normal limits. No evidence of bowel wall thickening, distention, or inflammatory changes.  Vascular/Lymphatic: Aortic atherosclerosis. No enlarged abdominal lymph nodes. Other: No abdominal wall hernia or abnormality. Trace perihepatic ascites. Musculoskeletal: No acute or significant osseous findings. IMPRESSION: 1. Large gallstone in the gallbladder. Mildly distended gallbladder with mild gallbladder wall thickening and pericholecystic fluid. Findings are consistent with acute cholecystitis. 2. No biliary ductal dilatation or evidence of choledocholithiasis. 3. Trace perihepatic ascites. Electronically Signed   By: Jearld Lesch M.D.   On: 02/15/2023 21:20   US Abdomen Limited RUQ (LIVER/GB)  Result Date: 02/15/2023 CLINICAL DATA:  [id number] RUQ abdominal pain [id number] EXAM: ULTRASOUND ABDOMEN LIMITED RIGHT UPPER QUADRANT COMPARISON:  01/30/2023. FINDINGS: Gallbladder: Mildly distended. Shadowing gallstone at the neck. Gallbladder wall thickening. No pericholecystic fluid Common bile duct: Diameter: 0.5 cm. Liver: Liver is hyperechoic consistent with fatty infiltration. No focal hepatic lesions identified. No intrahepatic ductal dilatation. Hepatopetal portal vein. IMPRESSION: 1. Hepatic fatty infiltration. 2. Findings suggest acute cholecystitis. This can be confirmed with a HIDA scan if indicated. Electronically Signed   By: Layla Maw M.D.   On: 02/15/2023 18:21   DG Chest 2 View  Result Date: 02/15/2023 CLINICAL DATA:  Chest pain EXAM: CHEST - 2 VIEW COMPARISON:  X-ray 09/25/2022 FINDINGS: No consolidation, pneumothorax or effusion. No edema. Normal cardiopericardial silhouette. Slight peribronchial thickening. IMPRESSION: Slight right-sided peribronchial thickening. Otherwise no acute cardiopulmonary disease. Electronically Signed   By: Piedad Climes.D.  On: 02/15/2023 17:34     Family Communication: Discussed with patient, he understands and agrees. All questions answereed.    Disposition: Status is: Inpatient Surgery team is primary, consulted hospitalist team for COPD  exacerbation management.  Planned Discharge Destination: Home     Time spent: 40  minutes  Author: Marcelino Duster, MD 02/17/2023 2:53 PM Secure chat 7am to 7pm For on call review [email or web address].

## 2023-02-17 NOTE — Progress Notes (Signed)
02/17/2023  Subjective: No acute events overnight.  Patient reports he's feeling better.  Started on clear liquids yesterday and denies any worsening pain or nausea.  His cough/congestion is somewhat improved.    Vital signs: Temp:  [97.3 F (36.3 C)-98.2 F (36.8 C)] 98 F (36.7 C) (10/07 0807) Pulse Rate:  [78-84] 82 (10/07 0807) Resp:  [13-20] 18 (10/07 0807) BP: (106-133)/(61-75) 119/62 (10/07 0807) SpO2:  [95 %-100 %] 96 % (10/07 0807) FiO2 (%):  [28 %] 28 % (10/07 0208)   Intake/Output: 10/06 0701 - 10/07 0700 In: 1091.2 [I.V.:1091.2] Out: 4795 [Urine:4795] Last BM Date : 02/15/23  Physical Exam: Constitutional: No acute distress Abdomen:  soft, non-distended, with some soreness to palpation in the RUQ.  Negative Murphy's sign.  Labs:  Recent Labs    02/16/23 0447 02/17/23 0605  WBC 6.1 3.6*  HGB 10.7* 10.9*  HCT 32.5* 32.9*  PLT 196 197   Recent Labs    02/16/23 0447 02/17/23 0605  NA 137 139  K 4.0 4.5  CL 101 103  CO2 28 30  GLUCOSE 127* 115*  BUN 12 10  CREATININE 1.16 1.13  CALCIUM 8.2* 8.3*   No results for input(s): "LABPROT", "INR" in the last 72 hours.  Imaging: No results found.  Assessment/Plan: This is a 72 y.o. male with acute cholecystitis.  --The patient has been improving with conservative measures.  Given his cough/bronchitis, for now would continue this with plans for interval cholecystectomy in the future once all the inflammation from this process has subsided.  Patient understands that if his pain worsens, may need to reconsider surgery vs drain placement. --Start full liquid diet today, possibly advance to low fat diet for dinner if doing well. --Will work on weaning his O2 today. --Continue IV abx. --Possible discharge home tomorrow.   I spent 35 minutes dedicated to the care of this patient on the date of this encounter to include pre-visit review of records, face-to-face time with the patient discussing diagnosis and  management, and any post-visit coordination of care.  Howie Ill, MD [facility]

## 2023-02-17 NOTE — Progress Notes (Signed)
PHARMACIST - PHYSICIAN COMMUNICATION  CONCERNING: IV to Oral Route Change Policy  RECOMMENDATION: This patient is receiving pantoprazole by the intravenous route.  Based on criteria approved by the Pharmacy and Therapeutics Committee, the intravenous medication(s) is/are being converted to the equivalent oral dose form(s).  DESCRIPTION: These criteria include: The patient is eating (either orally or via tube) and/or has been taking other orally administered medications for a least 24 hours The patient has no evidence of active gastrointestinal bleeding or impaired GI absorption (gastrectomy, short bowel, patient on TNA or NPO).  If you have questions about this conversion, please contact the Pharmacy Department   Tressie Ellis, [facility] 02/17/2023 7:35 AM

## 2023-02-17 NOTE — Progress Notes (Signed)
Mobility Specialist - Progress Note     02/17/23 0948  Mobility  Level of Assistance Standby assist, set-up cues, supervision of patient - no hands on  Assistive Device None  Distance Ambulated (ft) 120 ft  Range of Motion/Exercises Active  Activity Response Tolerated well  Mobility Referral Yes  $Mobility charge 1 Mobility  Mobility Specialist Start Time (ACUTE ONLY) 0933  Mobility Specialist Stop Time (ACUTE ONLY) 0950  Mobility Specialist Time Calculation (min) (ACUTE ONLY) 17 min   Pt resting in bed on 1L upon entry. Pt STS and ambulates to hallway SBA with no AD. Pt maintains steady gait throughout session with no LOB. Pt returned to bed and endorses feeling winded temporarily. Pt regains breathe spo2 (94). Pt left in bed with needs in reach SCD's on.   [name] Mobility Specialist 02/17/23, 9:58 AM

## 2023-02-18 ENCOUNTER — Ambulatory Visit: Payer: Self-pay | Admitting: *Deleted

## 2023-02-18 LAB — GLUCOSE, CAPILLARY
Glucose-Capillary: 116 mg/dL — ABNORMAL HIGH (ref 70–99)
Glucose-Capillary: 117 mg/dL — ABNORMAL HIGH (ref 70–99)

## 2023-02-18 MED ORDER — OXYCODONE HCL 5 MG PO TABS
5.0000 mg | ORAL_TABLET | ORAL | Status: DC | PRN
  Administered 2023-02-18: 5 mg via ORAL
  Filled 2023-02-18: qty 1

## 2023-02-18 NOTE — Patient Outreach (Signed)
Care Coordination   Follow Up Visit Note   02/18/2023 Name: JOVONNE ODORIZZI MRN: [id number] DOB: 04/10/1951  [name] is a 72 y.o. year old male who sees [name], FNP for primary care. I spoke with  [name] by phone today.  What matters to the patients health and wellness today?  Patient continue to discuss concerns related to his housing and barriers related to pest control. Home to be treated again on 02/26/23(third treatment). Consent received to discuss concerns with [facility].   Goals Addressed             This Visit's Progress    care coordination activities       Interventions Today    Flowsheet Row Most Recent Value  Chronic Disease   Chronic disease during today's visit Hypertension (HTN)  General Interventions   General Interventions Discussed/Reviewed General Interventions Reviewed, Community Resources  [patient admitted to the hospital with abdominal pain-patient's home remains infested with bed bugs and will be treated again on 02/26/23(3rd tx) consent provided to speak to mgmt company through [employer]]  Doctor Visits Discussed/Reviewed Doctor Visits Reviewed  [patient currenlty in the hospital with plan to discharge home today with [facility] through [place]- sister to provide transport home]              SDOH assessments and interventions completed:  No     Care Coordination Interventions:  Yes, provided   Follow up plan: Follow up call scheduled for 03/04/23    Encounter Outcome:  Patient Visit Completed

## 2023-02-18 NOTE — Discharge Summary (Signed)
[facility] SURGICAL DISCHARGE SUMMARY (cpt: [phone])  Patient ID: [name] MRN: [id number] DOB/AGE: 1950-07-27 72 y.o.  Admit date: 02/15/2023 Discharge date: 02/18/2023  Discharge Diagnoses Patient Active Problem List   Diagnosis Date Noted   Acute cholecystitis 02/15/2023    Consultants Medicine  Procedures None  HPI: [name] is a 73 y.o. male presenting for evaluation of abdominal pain.  The patient was seen recently in the emergency room on 01/30/2023 with upper abdominal pain and was diagnosed with cholelithiasis.  He was actually scheduled to see me in the office as an outpatient tomorrow but presented last night to emergency room again with the same symptoms.  Along with the pain is also associated with some nausea and vomiting.  However he is now also having a cough and congestion some associated shortness of breath.  In the emergency room, initial chest x-ray showed some slight right-sided peribronchial thickening but no significant disease.  Ultrasound was done which showed a gallstone at the neck of the gallbladder with associated wall thickening and distention.  Although his LFTs were normal, his lipase was elevated to 366.  As a precaution, MRCP was also obtained which again showed a gallstone in the gallbladder neck with mild gallbladder wall thickening and pericholecystic fluid consistent with acute cholecystitis.  However there was no choledocholithiasis and no pancreatitis noticeable.  Patient was admitted to the surgical team overnight.  However due to his shortness of breath and coughing, I asked our colleagues with internal medicine to evaluate the patient as well due to concerns for possible pneumonia or bronchitis given his history of COPD.  Dr. Para March evaluated the patient and agreed that due to wheezing congestion and coughing, might be safer to continue to treat acute exacerbation/acute bronchitis and postpone surgery.    Hospital Course: Patient was admitted to general surgery with medicine consultation. Patient did well and responded to conservative management with Abx alone. Advancement of patient's diet and ambulation were well-tolerated. The remainder of patient's hospital course was essentially unremarkable, and discharge planning was initiated accordingly with patient safely able to be discharged home with appropriate discharge instructions, antibiotics (Augmentin), pain control, and outpatient  follow-up after all of his questions were answered to his expressed satisfaction.  Please note, patient address concerns of needing O2 for home but never followed through with this previously. I will ask RN to ambulate and qualify patient for home O2 prior to DC. He understands his PCP will need to manage this long term.    Discharge Condition: Good   Physical Examination:  Constitutional: Well appearing male, NAD Pulmonary: Normal effort, on [place] Gastrointestinal: Soft, non-tender, non-distended, no rebound.guarding Skin: Warm, dry    Allergies as of 02/18/2023       Reactions   Opana [oxymorphone Hcl]    Made him BLACKOUT   Elemental Sulfur    Childhood reaction    Oxymorphone Other (See Comments)   Oxymorphone    Sulfa Antibiotics Other (See Comments)   Sulfa Antibiotics         Medication List     TAKE these medications    acetaminophen 500 MG tablet Commonly known as: TYLENOL Take 2 tablets (1,000 mg total) by mouth every 6 (six) hours as needed for mild pain.   alum & mag hydroxide-simeth 400-400-40 MG/5ML suspension Commonly known as: MAALOX PLUS Take 15 mLs by mouth every 6 (six) hours as needed for indigestion.   amLODipine 10 MG tablet Commonly known as: NORVASC  Take 1 tablet (10 mg total) by mouth daily.   amoxicillin-clavulanate 875-125 MG tablet Commonly known as: AUGMENTIN Take 1 tablet by mouth 2 (two) times daily for 10 days.   antiseptic oral rinse Liqd 15 mLs by  Mouth Rinse route as needed for dry mouth.   aspirin EC 81 MG tablet Take 81 mg by mouth daily. Swallow whole.   Blood Glucose Monitoring Suppl Devi 1 each by Does not apply route in the morning, at noon, and at bedtime. May substitute to any manufacturer covered by patient's insurance. Pt's current glucometer is broken.   buprenorphine 15 MCG/HR Commonly known as: Butrans Place 1 patch onto the skin once a week.   Combivent Respimat 20-100 MCG/ACT Aers respimat Generic drug: Ipratropium-Albuterol Inhale 1 puff into the lungs every 6 (six) hours as needed. Rescue inhaler   ibuprofen 600 MG tablet Commonly known as: ADVIL Take 1 tablet (600 mg total) by mouth every 8 (eight) hours as needed for moderate pain.   magic mouthwash (nystatin, lidocaine, diphenhydrAMINE, alum & mag hydroxide) suspension Swish and swallow 15 mLs 4 (four) times daily as needed for mouth pain.   nystatin 100000 UNIT/ML suspension Commonly known as: MYCOSTATIN Take 5 mLs by mouth daily.   omeprazole 40 MG capsule Commonly known as: PRILOSEC TAKE 1 CAPSULE BY MOUTH ONCE DAILY   QUEtiapine 25 MG tablet Commonly known as: SEROquel Take 1 tablet (25 mg total) by mouth at bedtime.   rosuvastatin 40 MG tablet Commonly known as: CRESTOR Take 1 tablet (40 mg total) by mouth daily.           Follow-up Information     Piscoya, Elita Quick, MD Follow up in 2 week(s).   Specialty: General Surgery Contact information: [address] [place] [address] [phone]                  Time spent on discharge management including discussion of hospital course, clinical condition, outpatient instructions, prescriptions, and follow up with the patient and members of the medical team: >30 minutes  -- Lynden Oxford , PA-C [facility]  02/18/2023, 10:00 AM [phone] M-F: 7am - 4pm

## 2023-02-18 NOTE — Plan of Care (Signed)

## 2023-02-18 NOTE — Progress Notes (Signed)
Discharge instructions were reviewed with patient. Questions were encourage and answered. IV was taken out. Belongings collected by patient.

## 2023-02-18 NOTE — Progress Notes (Signed)
SATURATION QUALIFICATIONS: (This note is used to comply with regulatory documentation for home oxygen)  Patient Saturations on Room Air at Rest = 92%  Patient Saturations on Room Air while Ambulating = 87%  Patient Saturations on 2 Liters of oxygen while Ambulating = 92%  Please briefly explain why patient needs home oxygen:  Patient SpO2 on room air decreased to 87% and he started complaining of SOB. 2L [place] was placed and pt's SpO2 improved to 92%

## 2023-02-18 NOTE — TOC Progression Note (Signed)
Transition of Care [facility]) - Progression Note    Patient Details  Name: [name] MRN: [id number] Date of Birth: 1950-07-19  Transition of Care [place]) CM/SW Contact  Chapman Fitch, RN Phone Number: 02/18/2023, 8:53 AM  Clinical Narrative:      Per flowsheet patient still on acute O2.  If home O2 indicated please consult [facility]       Expected Discharge Plan and Services                                               Social Determinants of Health (SDOH) Interventions SDOH Screenings   Food Insecurity: No Food Insecurity (02/16/2023)  Housing: Patient Declined (02/16/2023)  Recent Concern: Housing - Medium Risk (01/30/2023)  Transportation Needs: No Transportation Needs (02/16/2023)  Utilities: Not At Risk (02/16/2023)  Recent Concern: Utilities - At Risk (01/29/2023)  Alcohol Screen: Low Risk  (10/28/2022)  Depression (PHQ2-9): Medium Risk (01/29/2023)  Financial Resource Strain: Low Risk  (10/22/2022)  Physical Activity: Inactive (10/22/2022)  Social Connections: Socially Isolated (10/22/2022)  Stress: Stress Concern Present (01/29/2023)  Tobacco Use: Medium Risk (02/15/2023)    Readmission Risk Interventions     No data to display

## 2023-02-18 NOTE — Patient Instructions (Addendum)
Visit Information  Thank you for taking time to visit with me today. Please don't hesitate to contact me if I can be of assistance to you.   Following are the goals we discussed today:   Please follow up with your provider regarding any questions or concerns you have regarding your medical care CSW to contact [facility] Management regarding pest control   Our next appointment is by telephone on 03/04/23 at 2pm  Please call the care guide team at [phone] if you need to cancel or reschedule your appointment.   If you are experiencing a Mental Health or Behavioral Health Crisis or need someone to talk to, please call 911   Patient verbalizes understanding of instructions and care plan provided today and agrees to view in MyChart. Active MyChart status and patient understanding of how to access instructions and care plan via MyChart confirmed with patient.     Telephone follow up appointment with care management team member scheduled for: 03/04/23  Verna Czech, LCSW [place]  Value-Based Care Institute, [facility] Licensed Clinical Social Worker Care Coordinator  Direct Dial: [phone]

## 2023-02-18 NOTE — TOC Transition Note (Signed)
Transition of Care [facility]) - CM/SW Discharge Note   Patient Details  Name: [name] MRN: [id number] Date of Birth: 01-04-1951  Transition of Care [facility]) CM/SW Contact:  Chapman Fitch, RN Phone Number: 02/18/2023, 12:55 PM   Clinical Narrative:     Patient to discharge today. States his sister will transport at discharge  Patient with qualifying sats for home O2. Patient states he does not have a preference of DME agency.  Referral made to [facility] with [employer]  Discussed home health RN for COPD management.  Patient agreeable. CMS Medicare.gov Compare Post Acute Care list reviewed with Patient.  Patient selects [place] home health.  Referral made to [facility] with [facility].  Kandee Keen is aware that patient's apartment is being treated for bed begs.  Per Patient he lives in [place], and there is an common area there that agencies can meet residents.  [place] to coordinate with patient.    Final next level of care: Home w Home Health Services Barriers to Discharge: Barriers Resolved   Patient Goals and CMS Choice      Discharge Placement                         Discharge Plan and Services Additional resources added to the After Visit Summary for                  DME Arranged: Oxygen DME Agency: [employer] Date DME Agency Contacted: 02/18/23   Representative spoke with at DME Agency: Marthann Schiller HH Arranged: RN HH Agency: [facility] Date [facility] Agency Contacted: 02/18/23   Representative spoke with at [facility] Agency: Kandee Keen  Social Determinants of Health (SDOH) Interventions SDOH Screenings   Food Insecurity: No Food Insecurity (02/16/2023)  Housing: Patient Declined (02/16/2023)  Recent Concern: Housing - Medium Risk (01/30/2023)  Transportation Needs: No Transportation Needs (02/16/2023)  Utilities: Not At Risk (02/16/2023)  Recent Concern: Utilities - At Risk (01/29/2023)  Alcohol Screen: Low Risk  (10/28/2022)  Depression (PHQ2-9): Medium Risk (01/29/2023)   Financial Resource Strain: Low Risk  (10/22/2022)  Physical Activity: Inactive (10/22/2022)  Social Connections: Socially Isolated (10/22/2022)  Stress: Stress Concern Present (01/29/2023)  Tobacco Use: Medium Risk (02/15/2023)     Readmission Risk Interventions     No data to display

## 2023-02-18 NOTE — Care Management Important Message (Signed)
Important Message  Patient Details  Name: [name] MRN: [id number] Date of Birth: October 26, 1950   Important Message Given:  N/A - LOS <3 / Initial given by admissions     Olegario Messier A Diyan Dave 02/18/2023, 8:41 AM

## 2023-02-19 NOTE — Patient Outreach (Signed)
Care Management   Visit Note   Name: KWAME CASTALDO MRN: [id number] DOB: [birth year]  Subjective: [name] is a 72 y.o. year old male who is a primary care patient of [name], FNP. The Care Management team was consulted for assistance.      Message received from Primary Care Provider regarding need for assistance r/t housing.  Brief outreach with patient today. Reports being under quarantine since August. Reports home is currently infested with bedbugs. Expecting a team to return on tomorrow to spray and complete home inspection. Denies care management needs related to medications or disease management.  Pending outreach with LCSW regarding resources r/t housing.   PLAN Patient will engage with LCSW regarding resources r/t housing.   Katina Degree Health  [facility] RN Care Manager Direct Dial: [phone] Website: [email or web address]

## 2023-02-19 NOTE — Consult Note (Signed)
[facility] CM Inpatient Consult   02/19/2023  [name] January 10, 1951 [id number]  Primary Care Provider:  Merita Norton, FNP [facility])  Patient is currently active with Care Management for chronic disease management services.  Patient has been engaged by a RNCM and Child psychotherapist.  Our community based plan of care has focused on disease management and community resource support.   Patient will receive a post hospital call and will be evaluated for assessments and disease process education.   Liaison will collaborate with the community RNCM and LCSW concerning pt's discharge disposition,  Of note, Care Management services does not replace or interfere with any services that are needed or arranged by inpatient [facility] care management team.   For additional questions or referrals please contact:  Elliot Cousin, RN, [facility] Liaison [place]   Population Health Office Hours MTWF  8:00 am-6:00 pm [phone] mobile [phone] [Office toll free line] Office Hours are M-F 8:30 - 5 pm [email or web address]

## 2023-02-20 ENCOUNTER — Telehealth: Payer: Self-pay | Admitting: Family Medicine

## 2023-02-20 ENCOUNTER — Other Ambulatory Visit: Payer: Self-pay | Admitting: Physician Assistant

## 2023-02-20 LAB — CULTURE, BLOOD (ROUTINE X 2)
Culture: NO GROWTH
Special Requests: ADEQUATE

## 2023-02-20 NOTE — Telephone Encounter (Signed)
Requested medication (s) are due for refill today: review  Requested medication (s) are on the active medication list: no  Last refill:  10/23/22  Future visit scheduled: no  Notes to clinic:  prescribed by Lillia Abed, PA, Discontinued by: Clydia Llano, CMA on 11/19/2022 14:14  Please review for refill     Requested Prescriptions  Pending Prescriptions Disp Refills   amitriptyline (ELAVIL) 75 MG tablet [Pharmacy Med Name: AMITRIPTYLINE HCL 75 MG TAB] 90 tablet     Sig: TAKE ONE TABLET BY MOUTH AT BEDTIME FOR INSOMNIA     Psychiatry:  Antidepressants - Heterocyclics (TCAs) Passed - 02/20/2023  3:49 PM      Passed - Valid encounter within last 6 months    Recent Outpatient Visits           2 weeks ago Calculus of gallbladder without cholecystitis without obstruction   [facility] [name], FNP   3 weeks ago Mood disorder [facility])   [facility] Merita Norton T, FNP   3 months ago Type 2 diabetes mellitus without complication, without long-term current use of insulin [facility])   [facility] [place], [place], PA-C   5 months ago Low back pain due to bilateral sciatica   [facility] Alfredia Ferguson, PA-C   6 months ago Acute stress reaction   [facility] Alfredia Ferguson, [place]

## 2023-02-20 NOTE — Telephone Encounter (Signed)
Patient advised. Verbalized understanding 

## 2023-02-20 NOTE — Telephone Encounter (Signed)
Patient called in to see what medication was in his breathing machine while he was in the hospital. Please f/u with patient

## 2023-02-21 LAB — CULTURE, BLOOD (ROUTINE X 2)
Culture: NO GROWTH
Special Requests: ADEQUATE

## 2023-02-27 ENCOUNTER — Ambulatory Visit: Payer: Self-pay | Admitting: *Deleted

## 2023-02-27 NOTE — Telephone Encounter (Signed)
  Chief Complaint: Coughing a lot  Feels like the bronchitis is coming back.   He has COPD and recently discharged from the hospital.   Now on home oxygen. Symptoms: Non productive cough with occasional mucus brought up. Frequency: Started Sat. Morning feeling like the bronchitis coming back and coughing Pertinent Negatives: Patient denies fever or coughing up much of anything.   No shortness of breath beyond his usual with COPD but doing better since started on oxygen at home. Disposition: [] ED /[] Urgent Care (no appt availability in office) / Appointment(In office/virtual)/  [place] [facility]/ [] Home Care/ [] Refused Recommended Disposition /[] [place] [facility]/ []  Follow-up with PCP Additional Notes: Appt made with Janna Ostwalt,PA-C for 02/28/2023 at 8:20.

## 2023-02-27 NOTE — Telephone Encounter (Signed)
Message from [employer] M sent at 02/27/2023  3:15 PM EDT  Summary: Pt requests that a nurse return his call to advise what he can take for the cough.   Pt stated he was in the hospital last week and now he can not stop coughing. Attempted to schedule pt for an appt but there were no appts available until Monday 10/21. Pt requests that a nurse return his call to advise what he can take for the cough. Cb# [phone]          Call History  Contact Date/Time Type Contact Phone/Fax User  02/27/2023 03:10 PM EDT Phone (Incoming) Coral Else D IV "fred" (Self) [phone] Judie Petit) Marylen Ponto   Reason for Disposition  [1] Continuous (nonstop) coughing interferes with work or school AND [2] no improvement using cough treatment per Care Advice  Answer Assessment - Initial Assessment Questions 1. ONSET: "When did the cough begin?"      It started last Sat. Morning.    2. SEVERITY: "How bad is the cough today?"      I'm coughing up a little mucus.   I have COPD and am on oxygen 3. SPUTUM: "Describe the color of your sputum" (none, dry cough; clear, white, yellow, green)     Don't know 4. HEMOPTYSIS: "Are you coughing up any blood?" If so ask: "How much?" (flecks, streaks, tablespoons, etc.)     Not asked 5. DIFFICULTY BREATHING: "Are you having difficulty breathing?" If Yes, ask: "How bad is it?" (e.g., mild, moderate, severe)    - MILD: No SOB at rest, mild SOB with walking, speaks normally in sentences, can lie down, no retractions, pulse < 100.    - MODERATE: SOB at rest, SOB with minimal exertion and prefers to sit, cannot lie down flat, speaks in phrases, mild retractions, audible wheezing, pulse 100-120.    - SEVERE: Very SOB at rest, speaks in single words, struggling to breathe, sitting hunched forward, retractions, pulse > 120      I'm on oxygen now at home 6. FEVER: "Do you have a fever?" If Yes, ask: "What is your temperature, how was it measured, and when did it start?"      No 7. CARDIAC HISTORY: "Do you have any history of heart disease?" (e.g., heart attack, congestive heart failure)      Not asked 8. LUNG HISTORY: "Do you have any history of lung disease?"  (e.g., pulmonary embolus, asthma, emphysema)     COPD and bronchitis 9. PE RISK FACTORS: "Do you have a history of blood clots?" (or: recent major surgery, recent prolonged travel, bedridden)     Not asked 10. OTHER SYMPTOMS: "Do you have any other symptoms?" (e.g., runny nose, wheezing, chest pain)       Bad gallbladder that needs to be removed 11. PREGNANCY: "Is there any chance you are pregnant?" "When was your last menstrual period?"       N/A 12. TRAVEL: "Have you traveled out of the country in the last month?" (e.g., travel history, exposures)       N/A  Protocols used: Cough - Acute Non-Productive-A-AH

## 2023-02-28 ENCOUNTER — Ambulatory Visit (INDEPENDENT_AMBULATORY_CARE_PROVIDER_SITE_OTHER): Payer: Medicare HMO | Admitting: Physician Assistant

## 2023-02-28 DIAGNOSIS — Z91199 Patient's noncompliance with other medical treatment and regimen due to unspecified reason: Secondary | ICD-10-CM

## 2023-02-28 NOTE — Progress Notes (Signed)
Patient was not seen for appt d/t no call, no show, or late arrival >10 mins past appt time.    Debera Lat PA [facility] [address] [place], [place] [address] [phone] (phone) [phone] (fax) [facility]

## 2023-03-04 ENCOUNTER — Ambulatory Visit: Payer: Self-pay | Admitting: *Deleted

## 2023-03-04 NOTE — Patient Instructions (Signed)
Visit Information  Thank you for taking time to visit with me today. Please don't hesitate to contact me if I can be of assistance to you.   Following are the goals we discussed today:  Please call this social worker with any additional community resource needs Please contact your providers office with any questions or concerns related to your medical care  If you are experiencing a Mental Health or Behavioral Health Crisis or need someone to talk to, please call the Suicide and Crisis Lifeline: 988   Patient verbalizes understanding of instructions and care plan provided today and agrees to view in MyChart. Active MyChart status and patient understanding of how to access instructions and care plan via MyChart confirmed with patient.     No further follow up required: patient to contact this Child psychotherapist with any additional community resource needs  [employer], [employer] [place]  Value-Based Care Institute, [facility] Licensed Clinical Social Geologist, engineering Dial: [phone]

## 2023-03-04 NOTE — Patient Outreach (Signed)
Care Coordination   Follow Up Visit Note   03/04/2023 Name: [name] MRN: [id number] DOB: 1950-06-09  [name] is a 72 y.o. year old male who sees [name], FNP for primary care. I spoke with  [name] by phone today.  What matters to the patients health and wellness today?  Patient states that his home has been treated for pest control and has passed inspection. Patient confirms mood has improved due to improvements made in his home. Patient confirms having no further community resource needs at this time   Goals Addressed             This Visit's Progress    care coordination activities       Interventions Today    Flowsheet Row Most Recent Value  Chronic Disease   Chronic disease during today's visit Hypertension (HTN)  General Interventions   General Interventions Discussed/Reviewed General Interventions Reviewed, Doctor Visits  [patient confirms that he passed-heat tx completed last Thursday, inspection completed this morning-now working on re-organzing his home and re-placing furniture]  Doctor Visits Discussed/Reviewed Doctor Visits Reviewed, PCP  Andrey Cota that he missed appt with PCP on 10/18 agrees to re-schedule]  Mental Health Interventions   Mental Health Discussed/Reviewed Mental Health Reviewed, Mental Health Discussed, Coping Strategies, Anxiety  [patient confirms improvement in anxiety symptoms since home infestation has been resolved-pt confirms having no mental health needs at this time]              SDOH assessments and interventions completed:  No     Care Coordination Interventions:  Yes, provided   Follow up plan: No further intervention required.   Encounter Outcome:  Patient Visit Completed

## 2023-03-05 ENCOUNTER — Encounter: Payer: Self-pay | Admitting: *Deleted

## 2023-03-05 ENCOUNTER — Ambulatory Visit (INDEPENDENT_AMBULATORY_CARE_PROVIDER_SITE_OTHER): Payer: Medicare HMO | Admitting: Surgery

## 2023-03-05 ENCOUNTER — Encounter: Payer: Self-pay | Admitting: Surgery

## 2023-03-05 VITALS — BP 137/78 | HR 86 | Temp 98.0°F | Ht 69.0 in | Wt 212.0 lb

## 2023-03-05 DIAGNOSIS — K810 Acute cholecystitis: Secondary | ICD-10-CM | POA: Diagnosis not present

## 2023-03-05 NOTE — Patient Outreach (Signed)
Care Coordination   Collaboration   Visit Note   03/05/2023 Name: [name] MRN: [id number] DOB: [birth year]  [name] is a 72 y.o. year old male who sees [name], FNP for primary care. I  spoke with [employer]  What matters to the patients health and wellness today?  Pest control for apartment and ongoing in home support    Goals Addressed             This Visit's Progress    care coordination activities       Interventions Today    Flowsheet Row Most Recent Value  Chronic Disease   Chronic disease during today's visit Hypertension (HTN)  General Interventions   General Interventions Discussed/Reviewed Communication with, Level of Care  Communication with --  [[facility] -confirmed that pt's home has been treated for pest x3. If tx needed again, pt will have to cover the costs on his own-in home aid recommended for assistance with his daily care-pt can now replace furniture as needed.]  Level of Care Personal Care Services  [confirmed plan to discuss eligiblty application process for personal care services with patient]              SDOH assessments and interventions completed:  No     Care Coordination Interventions:  Yes, provided   Follow up plan: Follow up call scheduled for 03/13/23    Encounter Outcome:  Patient Visit Completed

## 2023-03-05 NOTE — Progress Notes (Unsigned)
03/05/2023  History of Present Illness: [name] is a 72 y.o. male presenting for follow up acute cholecystitis.  He was admitted on 02/15/23 with acute cholecystitis.  Initially was planned for surgery the next day but overnight on admission he become more wheezy and with shortness of breath and coughing.  He had developed bronchitis.  Hospitalist team was consulted and there was concern about undergoing general anesthesia and intubation.  As such, he was managed conservatively.  He did not require any percutaneous drainage.  He did require home O2 on discharge.  Today, he reports he's doing very well and denies any abdominal pain.  He did have mild epigastric discomfort a few days ago, but was short lived.  Denies any nausea, vomiting, diarrhea, or constipation.  He has fully recovered from his bronchitis and has not needed any oxygen recently.    Past Medical History: Past Medical History:  Diagnosis Date   Alcohol abuse    Benzodiazepine dependence (HCC)    Benzodiazepine withdrawal (HCC)    Chronic pain in right foot    COPD (chronic obstructive pulmonary disease) (HCC)    Depression 08/19/2017   Hepatitis C    Hypertension    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HCC)    Seizures (HCC) 2010   Sleep apnea    Syncope    questionable vasovagal     Past Surgical History: Past Surgical History:  Procedure Laterality Date   COLONOSCOPY WITH PROPOFOL N/A 12/16/2016   Procedure: COLONOSCOPY WITH PROPOFOL;  Surgeon: Christena Deem, MD;  Location[facility] ENDOSCOPY;  Service: Endoscopy;  Laterality: N/A;   COLONOSCOPY WITH PROPOFOL N/A 03/14/2017   Procedure: COLONOSCOPY WITH PROPOFOL;  Surgeon: Christena Deem, MD;  Location: [facility] ENDOSCOPY;  Service: Endoscopy;  Laterality: N/A;   COLONOSCOPY WITH PROPOFOL     FOOT SURGERY Right 03/12/2002   FOOT SURGERY     HEMORRHOID SURGERY     LITHOTRIPSY     MYRINGOTOMY     MYRINGOTOMY WITH TUBE PLACEMENT Left 04/28/2019   Procedure:  MYRINGOTOMY WITH TUBE PLACEMENT;  Surgeon: Bud Face, MD;  Location: [facility] SURGERY CNTR;  Service: ENT;  Laterality: Left;   NASOPHARYNGOSCOPY N/A 04/28/2019   Procedure: NASOPHARYNGOSCOPY WITH BIOPSY;  Surgeon: Bud Face, MD;  Location: [facility] SURGERY CNTR;  Service: ENT;  Laterality: N/A;   NASOPHARYNGOSCOPY     Port a cath Placement     PORTA CATH INSERTION N/A 05/20/2019   Procedure: PORTA CATH INSERTION;  Surgeon: Annice Needy, MD;  Location: [facility] INVASIVE CV LAB;  Service: Cardiovascular;  Laterality: N/A;   PORTA CATH REMOVAL N/A 11/29/2020   Procedure: PORTA CATH REMOVAL;  Surgeon: Annice Needy, MD;  Location: [facility] INVASIVE CV LAB;  Service: Cardiovascular;  Laterality: N/A;   TONSILLECTOMY      Home Medications: Prior to Admission medications   Medication Sig Start Date End Date Taking? Authorizing Provider  acetaminophen (TYLENOL) 500 MG tablet Take 2 tablets (1,000 mg total) by mouth every 6 (six) hours as needed for mild pain. 02/17/23  Yes Aarin Sparkman, Elita Quick, MD  amLODipine (NORVASC) 10 MG tablet Take 1 tablet (10 mg total) by mouth daily. 01/24/23  Yes [name], FNP  antiseptic oral rinse (BIOTENE) LIQD 15 mLs by Mouth Rinse route as needed for dry mouth.   Yes [provider]  aspirin EC 81 MG tablet Take 81 mg by mouth daily. Swallow whole.   Yes [provider]  Blood Glucose Monitoring Suppl DEVI 1 each by  Does not apply route in the morning, at noon, and at bedtime. May substitute to any manufacturer covered by patient's insurance. Pt's current glucometer is broken. 10/28/22  Yes Drubel, Lillia Abed, PA-C  buprenorphine (BUTRANS) 15 MCG/HR Place 1 patch onto the skin once a week. 01/26/23 04/20/23 Yes Edward Jolly, MD  ibuprofen (ADVIL) 600 MG tablet Take 1 tablet (600 mg total) by mouth every 8 (eight) hours as needed for moderate pain. 02/17/23  Yes Kesia Dalto, MD  Ipratropium-Albuterol (COMBIVENT RESPIMAT) 20-100 MCG/ACT AERS respimat Inhale 1  puff into the lungs every 6 (six) hours as needed. Rescue inhaler 05/09/22  Yes Lyndon Code, MD  nystatin (MYCOSTATIN) 100000 UNIT/ML suspension Take 5 mLs by mouth daily. 02/06/23  Yes [provider]  omeprazole (PRILOSEC) 40 MG capsule TAKE 1 CAPSULE BY MOUTH ONCE DAILY 10/23/22  Yes Drubel, Lillia Abed, PA-C  rosuvastatin (CRESTOR) 40 MG tablet Take 1 tablet (40 mg total) by mouth daily. 01/24/23  Yes [name], FNP    Allergies: Allergies  Allergen Reactions   Opana [Oxymorphone Hcl]     Made him BLACKOUT   Elemental Sulfur     Childhood reaction    Oxymorphone Other (See Comments)   Oxymorphone    Sulfa Antibiotics Other (See Comments)   Sulfa Antibiotics     Review of Systems: Review of Systems  Constitutional:  Negative for chills and fever.  HENT:  Negative for hearing loss.   Respiratory:  Negative for shortness of breath.   Cardiovascular:  Negative for chest pain.  Gastrointestinal:  Negative for abdominal pain, nausea and vomiting.  Genitourinary:  Negative for dysuria.  Musculoskeletal:  Negative for myalgias.  Skin:  Negative for rash.  Neurological:  Negative for dizziness.  Psychiatric/Behavioral:  Negative for depression.     Physical Exam BP 137/78   Pulse 86   Temp 98 F (36.7 C)   Ht 5\' 9"  (1.753 m)   Wt 212 lb (96.2 kg)   SpO2 97%   BMI 31.31 kg/m  CONSTITUTIONAL: No acute distress, well nourished. HEENT:  Normocephalic, atraumatic, extraocular motion intact. NECK:  Trachea is midline, no jugular venous distention. RESPIRATORY:  Lungs are clear, and breath sounds are equal bilaterally. Normal respiratory effort without pathologic use of accessory muscles. CARDIOVASCULAR: Heart is regular without murmurs, gallops, or rubs. GI: The abdomen is soft, non-distended, non-tender to palpation.  Negative Murphy's sign.  MUSCULOSKELETAL:  No peripheral edema, normal gait. NEUROLOGIC:  Motor and sensation is grossly normal.  Cranial nerves are  grossly intact. PSYCH:  Alert and oriented to person, place and time. Affect is normal.  Labs/Imaging: Ultrasound RUQ on 02/15/23: IMPRESSION: 1. Hepatic fatty infiltration. 2. Findings suggest acute cholecystitis. This can be confirmed with a HIDA scan if indicated.  MRCP on 02/15/23: IMPRESSION: 1. Large gallstone in the gallbladder. Mildly distended gallbladder with mild gallbladder wall thickening and pericholecystic fluid. Findings are consistent with acute cholecystitis. 2. No biliary ductal dilatation or evidence of choledocholithiasis. 3. Trace perihepatic ascites.  Assessment and Plan: This is a 72 y.o. male with acute cholecystitis.  --Discussed with the patient again the rationale behind holding off on surgery during his admission, due to his bronchitis.  He has been doing well since his discharge, without any nausea and only one episode of mild epigastric discomfort.  He's adhering to a low fat diet.   --Discussed that based on his presentation with acute cholecystitis, would recommend proceeding with interval cholecystectomy to prevent recurrence.  He's in agreement.  Discussed with him then the plan for a robotic assisted cholecystectomy and reviewed the surgery at length with him including the planned incisions, the risks of bleeding, infection, injury to surrounding structures, the use of ICG to better evaluate the biliary anatomy, that this would be an outpatient surgery, post-operative activity restrictions, pain control, and he's willing to proceed. --Will schedule him for surgery on 04/17/23.  Will send for medical clearance.  His last dose of ASA would be on 04/11/23.  He will follow up with me towards end of November for H&P update.  I spent 40 minutes dedicated to the care of this patient on the date of this encounter to include pre-visit review of records, face-to-face time with the patient discussing diagnosis and management, and any post-visit coordination of  care.   Howie Ill, MD [facility]

## 2023-03-05 NOTE — Patient Instructions (Addendum)
You have requested to have your gallbladder removed. This will be done at [facility] with Dr. Aleen Campi.  You will most likely be out of work 1-2 weeks for this surgery.  If you have FMLA or disability paperwork that needs filled out you may drop this off at our office or this can be faxed to [phone].  You will return after your post-op appointment with a lifting restriction for approximately 4 more weeks.  You will be able to eat anything you would like to following surgery. But, start by eating a bland diet and advance this as tolerated. The Gallbladder diet is below, please go as closely by this diet as possible prior to surgery to avoid any further attacks.  Please see the (blue)pre-care form that you have been given today. Our surgery scheduler will call you to verify surgery date and to go over information.   If you have any questions, please call our office.  Laparoscopic Cholecystectomy Laparoscopic cholecystectomy is surgery to remove the gallbladder. The gallbladder is located in the upper right part of the abdomen, behind the liver. It is a storage sac for bile, which is produced in the liver. Bile aids in the digestion and absorption of fats. Cholecystectomy is often done for inflammation of the gallbladder (cholecystitis). This condition is usually caused by a buildup of gallstones (cholelithiasis) in the gallbladder. Gallstones can block the flow of bile, and that can result in inflammation and pain. In severe cases, emergency surgery may be required. If emergency surgery is not required, you will have time to prepare for the procedure. Laparoscopic surgery is an alternative to open surgery. Laparoscopic surgery has a shorter recovery time. Your common bile duct may also need to be examined during the procedure. If stones are found in the common bile duct, they may be removed. LET [facility] CARE PROVIDER KNOW ABOUT: Any allergies you have. All medicines you are taking,  including vitamins, herbs, eye drops, creams, and over-the-counter medicines. Previous problems you or members of your family have had with the use of anesthetics. Any blood disorders you have. Previous surgeries you have had.  Any medical conditions you have. RISKS AND COMPLICATIONS Generally, this is a safe procedure. However, problems may occur, including: Infection. Bleeding. Allergic reactions to medicines. Damage to other structures or organs. A stone remaining in the common bile duct. A bile leak from the cyst duct that is clipped when your gallbladder is removed. The need to convert to open surgery, which requires a larger incision in the abdomen. This may be necessary if your surgeon thinks that it is not safe to continue with a laparoscopic procedure. BEFORE THE PROCEDURE Ask your health care provider about: Changing or stopping your regular medicines. This is especially important if you are taking diabetes medicines or blood thinners. Taking medicines such as aspirin and ibuprofen. These medicines can thin your blood. Do not take these medicines before your procedure if your health care provider instructs you not to. Follow instructions from your health care provider about eating or drinking restrictions. Let your health care provider know if you develop a cold or an infection before surgery. Plan to have someone take you home after the procedure. Ask your health care provider how your surgical site will be marked or identified. You may be given antibiotic medicine to help prevent infection. PROCEDURE To reduce your risk of infection: Your health care team will wash or sanitize their hands. Your skin will be washed with soap. An IV  tube may be inserted into one of your veins. You will be given a medicine to make you fall asleep (general anesthetic). A breathing tube will be placed in your mouth. The surgeon will make several small cuts (incisions) in your abdomen. A thin,  lighted tube (laparoscope) that has a tiny camera on the end will be inserted through one of the small incisions. The camera on the laparoscope will send a picture to a TV screen (monitor) in the operating room. This will give the surgeon a good view inside your abdomen. A gas will be pumped into your abdomen. This will expand your abdomen to give the surgeon more room to perform the surgery. Other tools that are needed for the procedure will be inserted through the other incisions. The gallbladder will be removed through one of the incisions. After your gallbladder has been removed, the incisions will be closed with stitches (sutures), staples, or skin glue. Your incisions may be covered with a bandage (dressing). The procedure may vary among health care providers and hospitals. AFTER THE PROCEDURE Your blood pressure, heart rate, breathing rate, and blood oxygen level will be monitored often until the medicines you were given have worn off. You will be given medicines as needed to control your pain.   This information is not intended to replace advice given to you by your health care provider. Make sure you discuss any questions you have with your health care provider.   Document Released: 04/29/2005 Document Revised: 01/18/2015 Document Reviewed: 12/09/2012 Elsevier Interactive Patient Education 2016 Elsevier Inc.   Low-Fat Diet for Gallbladder Conditions A low-fat diet can be helpful if you have pancreatitis or a gallbladder condition. With these conditions, your pancreas and gallbladder have trouble digesting fats. A healthy eating plan with less fat will help rest your pancreas and gallbladder and reduce your symptoms. WHAT DO I NEED TO KNOW ABOUT THIS DIET? Eat a low-fat diet. Reduce your fat intake to less than 20-30% of your total daily calories. This is less than 50-60 g of fat per day. Remember that you need some fat in your diet. Ask your dietician what your daily goal should  be. Choose nonfat and low-fat healthy foods. Look for the words "nonfat," "low fat," or "fat free." As a guide, look on the label and choose foods with less than 3 g of fat per serving. Eat only one serving. Avoid alcohol. Do not smoke. If you need help quitting, talk with your health care provider. Eat small frequent meals instead of three large heavy meals. WHAT FOODS CAN I EAT? Grains Include healthy grains and starches such as potatoes, wheat bread, fiber-rich cereal, and brown rice. Choose whole grain options whenever possible. In adults, whole grains should account for 45-65% of your daily calories.  Fruits and Vegetables Eat plenty of fruits and vegetables. Fresh fruits and vegetables add fiber to your diet. Meats and Other Protein Sources Eat lean meat such as chicken and pork. Trim any fat off of meat before cooking it. Eggs, fish, and beans are other sources of protein. In adults, these foods should account for 10-35% of your daily calories. Dairy Choose low-fat milk and dairy options. Dairy includes fat and protein, as well as calcium.  Fats and Oils Limit high-fat foods such as fried foods, sweets, baked goods, sugary drinks.  Other Creamy sauces and condiments, such as mayonnaise, can add extra fat. Think about whether or not you need to use them, or use smaller amounts or low fat options.  WHAT FOODS ARE NOT RECOMMENDED? High fat foods, such as: [employer]. Ice cream. [place] toast. Sweet rolls. Pizza. Cheese bread. Foods covered with batter, butter, creamy sauces, or cheese. Fried foods. Sugary drinks and desserts. Foods that cause gas or bloating   This information is not intended to replace advice given to you by your health care provider. Make sure you discuss any questions you have with your health care provider.  Gallbladder Eating Plan High blood cholesterol, obesity, a sedentary lifestyle, an unhealthy diet, and diabetes are risk factors for developing  gallstones. If you have a gallbladder condition, you may have trouble digesting fats and tolerating high fat intake. Eating a low-fat diet can help reduce your symptoms and may be helpful before and after having surgery to remove your gallbladder (cholecystectomy). Your health care provider may recommend that you work with a dietitian to help you reduce the amount of fat in your diet. What are tips for following this plan? General guidelines Limit your fat intake to less than 30% of your total daily calories. If you eat around 1,800 calories each day, this means eating less than 60 grams (g) of fat per day. Fat is an important part of a healthy diet. Eating a low-fat diet can make it hard to maintain a healthy body weight. Ask your dietitian how much fat, calories, and other nutrients you need each day. Eat small, frequent meals throughout the day instead of three large meals. Drink at least 8-10 cups (1.9-2.4 L) of fluid a day. Drink enough fluid to keep your urine pale yellow. If you drink alcohol: Limit how much you have to: 0-1 drink a day for women who are not pregnant. 0-2 drinks a day for men. Know how much alcohol is in a drink. In the [place] one drink equals one 12 oz bottle of beer (355 mL), one 5 oz glass of wine (148 mL), or one 1 oz glass of hard liquor (44 mL). Reading food labels  Check nutrition facts on food labels for the amount of fat per serving. Choose foods with less than 3 grams of fat per serving. Shopping Choose nonfat and low-fat healthy foods. Look for the words "nonfat," "low-fat," or "fat-free." Avoid buying processed or prepackaged foods. Cooking Cook using low-fat methods, such as baking, broiling, grilling, or boiling. Cook with small amounts of healthy fats, such as olive oil, grapeseed oil, canola oil, avocado oil, or sunflower oil. What foods are recommended?  All fresh, frozen, or canned fruits and vegetables. Whole grains. Low-fat or nonfat (skim) milk  and yogurt. Lean meat, skinless poultry, fish, eggs, and beans. Low-fat protein supplement powders or drinks. Spices and herbs. The items listed above may not be a complete list of foods and beverages you can eat and drink. Contact a dietitian for more information. What foods are not recommended? High-fat foods. These include baked goods, fast food, fatty cuts of meat, ice cream, french toast, sweet rolls, pizza, cheese bread, foods covered with butter, creamy sauces, or cheese. Fried foods. These include french fries, tempura, battered fish, breaded chicken, fried breads, and sweets. Foods that cause bloating and gas. The items listed above may not be a complete list of foods that you should avoid. Contact a dietitian for more information. Summary A low-fat diet can be helpful if you have a gallbladder condition, or before and after gallbladder surgery. Limit your fat intake to less than 30% of your total daily calories. This is about 60 g of fat if you  eat 1,800 calories each day. Eat small, frequent meals throughout the day instead of three large meals. This information is not intended to replace advice given to you by your health care provider. Make sure you discuss any questions you have with your health care provider. Document Revised: 04/13/2021 Document Reviewed: 04/13/2021 Elsevier Patient Education  2024 [employer].

## 2023-03-06 ENCOUNTER — Telehealth: Payer: Self-pay | Admitting: Surgery

## 2023-03-06 NOTE — Progress Notes (Signed)
Request for Medical Clearance has been faxed to Tally Joe, FNP.  ?

## 2023-03-06 NOTE — Telephone Encounter (Signed)
Patient has been advised of Pre-Admission date/time, and Surgery date at [facility].  Surgery Date: 04/17/23 Preadmission Testing Date: 04/08/23 (phone 1p-4p)  Patient has been made aware to call [phone], between 1-3:00pm the day before surgery, to find out what time to arrive for surgery.

## 2023-03-12 ENCOUNTER — Other Ambulatory Visit: Payer: Medicare HMO

## 2023-03-13 ENCOUNTER — Ambulatory Visit: Payer: Self-pay | Admitting: *Deleted
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
## 2023-03-18 ENCOUNTER — Ambulatory Visit (INDEPENDENT_AMBULATORY_CARE_PROVIDER_SITE_OTHER): Payer: Medicare HMO | Admitting: Family Medicine

## 2023-03-18 DIAGNOSIS — Z5982 Transportation insecurity: Secondary | ICD-10-CM

## 2023-03-18 DIAGNOSIS — Z91199 Patient's noncompliance with other medical treatment and regimen due to unspecified reason: Secondary | ICD-10-CM

## 2023-03-18 NOTE — Progress Notes (Signed)
Patient was not seen for appt d/t no call, no show, or late arrival >10 mins past appt time. Upon review, patient noted that he did not have transportation to come to appt.  [name], FNP  [facility] [address] [place], [place] [address] [phone] (phone) [phone] (fax) [facility]

## 2023-03-20 DIAGNOSIS — J449 Chronic obstructive pulmonary disease, unspecified: Secondary | ICD-10-CM | POA: Diagnosis not present

## 2023-04-01 ENCOUNTER — Other Ambulatory Visit: Payer: Self-pay | Admitting: Surgery

## 2023-04-07 ENCOUNTER — Ambulatory Visit: Payer: Medicare HMO | Admitting: Surgery

## 2023-04-08 ENCOUNTER — Other Ambulatory Visit: Payer: Self-pay

## 2023-04-08 ENCOUNTER — Encounter
Admission: RE | Admit: 2023-04-08 | Discharge: 2023-04-08 | Disposition: A | Discharge status: Home / Self Care | Payer: Medicare HMO | Source: Ambulatory Visit | Attending: Surgery | Admitting: Surgery

## 2023-04-08 VITALS — Ht 69.0 in | Wt 210.0 lb

## 2023-04-08 DIAGNOSIS — G5641 Causalgia of right upper limb: Secondary | ICD-10-CM

## 2023-04-08 NOTE — Patient Instructions (Addendum)
Your procedure is scheduled on: Thursday 04/17/23 Report to the Registration Desk on the 1st floor of the Medical Mall. To find out your arrival time, please call [phone] between 1PM - 3PM on: Wednesday 04/16/2023  If your arrival time is 6:00 am, do not arrive before that time as the Medical Mall entrance doors do not open until 6:00 am.  REMEMBER: Instructions that are not followed completely may result in serious medical risk, up to and including death; or upon the discretion of your surgeon and anesthesiologist your surgery may need to be rescheduled.  Do not eat food after midnight the night before surgery.  No gum chewing or hard candies.  You may however, drink CLEAR liquids up to 2 hours before you are scheduled to arrive for your surgery. Do not drink anything within 2 hours of your scheduled arrival time.  Clear liquids include: - water  Do NOT drink anything that is not on this list.  One week prior to surgery: Stop Anti-inflammatories (NSAIDS) such as Advil, Aleve, Ibuprofen, Motrin, Naproxen, Naprosyn and Aspirin based products such as Excedrin, Goody's Powder, BC Powder. Stop ANY OVER THE COUNTER supplements until after surgery.  You may however, continue to take Tylenol if needed for pain up until the day of surgery.  Stop aspirin EC 81 MG tablet 5 days prior to surgery (take last dose 04/11/23 per doctor's order) Continue taking all of your other prescription medications up until the day of surgery.  ON THE DAY OF SURGERY ONLY TAKE THESE MEDICATIONS WITH SIPS OF WATER:  amLODipine (NORVASC) 10 MG  rosuvastatin (CRESTOR) 40 MG   Use inhalers on the day of surgery and bring to the hospital. Ipratropium-Albuterol (COMBIVENT RESPIMAT) 20-100 MCG/ACT AERS   No Alcohol for 24 hours before or after surgery.  No Smoking including e-cigarettes for 24 hours before surgery.  No chewable tobacco products for at least 6 hours before surgery.  No nicotine patches on the  day of surgery.  Do not use any "recreational" drugs for at least a week (preferably 2 weeks) before your surgery.  Please be advised that the combination of cocaine and anesthesia may have negative outcomes, up to and including death. If you test positive for cocaine, your surgery will be cancelled.  On the morning of surgery brush your teeth with toothpaste and water, you may rinse your mouth with mouthwash if you wish. Do not swallow any toothpaste or mouthwash.  Use CHG Soap or wipes as directed on instruction sheet.  Do not wear jewelry, make-up, hairpins, clips or nail polish.  For welded (permanent) jewelry: bracelets, anklets, waist bands, etc.  Please have this removed prior to surgery.  If it is not removed, there is a chance that hospital personnel will need to cut it off on the day of surgery.  Do not wear lotions, powders, or perfumes.   Do not shave body hair from the neck down 48 hours before surgery.  Contact lenses, hearing aids and dentures may not be worn into surgery.  Do not bring valuables to the hospital. [facility] is not responsible for any missing/lost belongings or valuables.   Notify your doctor if there is any change in your medical condition (cold, fever, infection).  Wear comfortable clothing (specific to your surgery type) to the hospital.  After surgery, you can help prevent lung complications by doing breathing exercises.  Take deep breaths and cough every 1-2 hours. Your doctor may order a device called an Facilities manager to  help you take deep breaths. When coughing or sneezing, hold a pillow firmly against your incision with both hands. This is called "splinting." Doing this helps protect your incision. It also decreases belly discomfort.  If you are being admitted to the hospital overnight, leave your suitcase in the car. After surgery it may be brought to your room.  In case of increased patient census, it may be necessary for you, the  patient, to continue your postoperative care in the Same Day Surgery department.  If you are being discharged the day of surgery, you will not be allowed to drive home. You will need a responsible individual to drive you home and stay with you for 24 hours after surgery.   If you are taking public transportation, you will need to have a responsible individual with you.  Please call the Pre-admissions Testing Dept. at [phone] if you have any questions about these instructions.  Surgery Visitation Policy:  Patients having surgery or a procedure may have two visitors.  Children under the age of 20 must have an adult with them who is not the patient.  Inpatient Visitation:    Visiting hours are 7 a.m. to 8 p.m. Up to four visitors are allowed at one time in a patient room. The visitors may rotate out with other people during the day.  One visitor age 57 or older may stay with the patient overnight and must be in the room by 8 p.m.    Preparing for Surgery with CHLORHEXIDINE GLUCONATE (CHG) Soap  Chlorhexidine Gluconate (CHG) Soap  o An antiseptic cleaner that kills germs and bonds with the skin to continue killing germs even after washing  o Used for showering the night before surgery and morning of surgery  Before surgery, you can play an important role by reducing the number of germs on your skin.  CHG (Chlorhexidine gluconate) soap is an antiseptic cleanser which kills germs and bonds with the skin to continue killing germs even after washing.  Please do not use if you have an allergy to CHG or antibacterial soaps. If your skin becomes reddened/irritated stop using the CHG.  1. Shower the NIGHT BEFORE SURGERY and the MORNING OF SURGERY with CHG soap.  2. If you choose to wash your hair, wash your hair first as usual with your normal shampoo.  3. After shampooing, rinse your hair and body thoroughly to remove the shampoo.  4. Use CHG as you would any other liquid soap.  You can apply CHG directly to the skin and wash gently with a scrungie or a clean washcloth.  5. Apply the CHG soap to your body only from the neck down. Do not use on open wounds or open sores. Avoid contact with your eyes, ears, mouth, and genitals (private parts). Wash face and genitals (private parts) with your normal soap.  6. Wash thoroughly, paying special attention to the area where your surgery will be performed.  7. Thoroughly rinse your body with warm water.  8. Do not shower/wash with your normal soap after using and rinsing off the CHG soap.  9. Pat yourself dry with a clean towel.  10. Wear clean pajamas to bed the night before surgery.  12. Place clean sheets on your bed the night of your first shower and do not sleep with pets.  13. Shower again with the CHG soap on the day of surgery prior to arriving at the hospital.  14. Do not apply any deodorants/lotions/powders.  15. Please  wear clean clothes to the hospital.

## 2023-04-09 NOTE — Progress Notes (Unsigned)
Medical Clearance has been received from Merita Norton, FNP. The patient is optimized for surgery. He is cleared at High risk for the procedure.

## 2023-04-13 DIAGNOSIS — K219 Gastro-esophageal reflux disease without esophagitis: Secondary | ICD-10-CM | POA: Diagnosis not present

## 2023-04-13 DIAGNOSIS — C119 Malignant neoplasm of nasopharynx, unspecified: Secondary | ICD-10-CM | POA: Diagnosis not present

## 2023-04-13 DIAGNOSIS — J441 Chronic obstructive pulmonary disease with (acute) exacerbation: Secondary | ICD-10-CM | POA: Diagnosis not present

## 2023-04-14 ENCOUNTER — Ambulatory Visit (INDEPENDENT_AMBULATORY_CARE_PROVIDER_SITE_OTHER): Payer: Medicare HMO | Admitting: Surgery

## 2023-04-14 ENCOUNTER — Encounter: Payer: Self-pay | Admitting: Surgery

## 2023-04-14 VITALS — BP 151/93 | HR 102 | Temp 97.9°F | Ht 69.0 in | Wt 206.4 lb

## 2023-04-14 DIAGNOSIS — K810 Acute cholecystitis: Secondary | ICD-10-CM

## 2023-04-14 NOTE — Patient Instructions (Signed)
You have requested to have your gallbladder removed. This will be done at [facility] with Dr. Piscoya.  You will most likely be out of work 1-2 weeks for this surgery.  If you have FMLA or disability paperwork that needs filled out you may drop this off at our office or this can be faxed to [phone].  You will return after your post-op appointment with a lifting restriction for approximately 4 more weeks.  You will be able to eat anything you would like to following surgery. But, start by eating a bland diet and advance this as tolerated. The Gallbladder diet is below, please go as closely by this diet as possible prior to surgery to avoid any further attacks.  Please see the (blue)pre-care form that you have been given today. Our surgery scheduler will call you to verify surgery date and to go over information.   If you have any questions, please call our office.  Laparoscopic Cholecystectomy Laparoscopic cholecystectomy is surgery to remove the gallbladder. The gallbladder is located in the upper right part of the abdomen, behind the liver. It is a storage sac for bile, which is produced in the liver. Bile aids in the digestion and absorption of fats. Cholecystectomy is often done for inflammation of the gallbladder (cholecystitis). This condition is usually caused by a buildup of gallstones (cholelithiasis) in the gallbladder. Gallstones can block the flow of bile, and that can result in inflammation and pain. In severe cases, emergency surgery may be required. If emergency surgery is not required, you will have time to prepare for the procedure. Laparoscopic surgery is an alternative to open surgery. Laparoscopic surgery has a shorter recovery time. Your common bile duct may also need to be examined during the procedure. If stones are found in the common bile duct, they may be removed. LET YOUR HEALTH CARE PROVIDER KNOW ABOUT: Any allergies you have. All medicines you are taking,  including vitamins, herbs, eye drops, creams, and over-the-counter medicines. Previous problems you or members of your family have had with the use of anesthetics. Any blood disorders you have. Previous surgeries you have had.  Any medical conditions you have. RISKS AND COMPLICATIONS Generally, this is a safe procedure. However, problems may occur, including: Infection. Bleeding. Allergic reactions to medicines. Damage to other structures or organs. A stone remaining in the common bile duct. A bile leak from the cyst duct that is clipped when your gallbladder is removed. The need to convert to open surgery, which requires a larger incision in the abdomen. This may be necessary if your surgeon thinks that it is not safe to continue with a laparoscopic procedure. BEFORE THE PROCEDURE Ask your health care provider about: Changing or stopping your regular medicines. This is especially important if you are taking diabetes medicines or blood thinners. Taking medicines such as aspirin and ibuprofen. These medicines can thin your blood. Do not take these medicines before your procedure if your health care provider instructs you not to. Follow instructions from your health care provider about eating or drinking restrictions. Let your health care provider know if you develop a cold or an infection before surgery. Plan to have someone take you home after the procedure. Ask your health care provider how your surgical site will be marked or identified. You may be given antibiotic medicine to help prevent infection. PROCEDURE To reduce your risk of infection: Your health care team will wash or sanitize their hands. Your skin will be washed with soap. An IV   tube may be inserted into one of your veins. You will be given a medicine to make you fall asleep (general anesthetic). A breathing tube will be placed in your mouth. The surgeon will make several small cuts (incisions) in your abdomen. A thin,  lighted tube (laparoscope) that has a tiny camera on the end will be inserted through one of the small incisions. The camera on the laparoscope will send a picture to a TV screen (monitor) in the operating room. This will give the surgeon a good view inside your abdomen. A gas will be pumped into your abdomen. This will expand your abdomen to give the surgeon more room to perform the surgery. Other tools that are needed for the procedure will be inserted through the other incisions. The gallbladder will be removed through one of the incisions. After your gallbladder has been removed, the incisions will be closed with stitches (sutures), staples, or skin glue. Your incisions may be covered with a bandage (dressing). The procedure may vary among health care providers and hospitals. AFTER THE PROCEDURE Your blood pressure, heart rate, breathing rate, and blood oxygen level will be monitored often until the medicines you were given have worn off. You will be given medicines as needed to control your pain.   This information is not intended to replace advice given to you by your health care provider. Make sure you discuss any questions you have with your health care provider.   Document Released: 04/29/2005 Document Revised: 01/18/2015 Document Reviewed: 12/09/2012 Elsevier Interactive Patient Education 2016 [place] Diet for Gallbladder Conditions A low-fat diet can be helpful if you have pancreatitis or a gallbladder condition. With these conditions, your pancreas and gallbladder have trouble digesting fats. A healthy eating plan with less fat will help rest your pancreas and gallbladder and reduce your symptoms. WHAT DO I NEED TO KNOW ABOUT THIS DIET? Eat a low-fat diet. Reduce your fat intake to less than 20-30% of your total daily calories. This is less than 50-60 g of fat per day. Remember that you need some fat in your diet. Ask your dietician what your daily goal should  be. Choose nonfat and low-fat healthy foods. Look for the words "nonfat," "low fat," or "fat free." As a guide, look on the label and choose foods with less than 3 g of fat per serving. Eat only one serving. Avoid alcohol. Do not smoke. If you need help quitting, talk with your health care provider. Eat small frequent meals instead of three large heavy meals. WHAT FOODS CAN I EAT? Grains Include healthy grains and starches such as potatoes, wheat bread, fiber-rich cereal, and brown rice. Choose whole grain options whenever possible. In adults, whole grains should account for 45-65% of your daily calories.  Fruits and Vegetables Eat plenty of fruits and vegetables. Fresh fruits and vegetables add fiber to your diet. Meats and Other Protein Sources Eat lean meat such as chicken and pork. Trim any fat off of meat before cooking it. Eggs, fish, and beans are other sources of protein. In adults, these foods should account for 10-35% of your daily calories. Dairy Choose low-fat milk and dairy options. Dairy includes fat and protein, as well as calcium.  Fats and Oils Limit high-fat foods such as fried foods, sweets, baked goods, sugary drinks.  Other Creamy sauces and condiments, such as mayonnaise, can add extra fat. Think about whether or not you need to use them, or use smaller amounts or low fat options.  WHAT FOODS ARE NOT RECOMMENDED? High fat foods, such as: [employer]. Ice cream. [place] toast. Sweet rolls. Pizza. Cheese bread. Foods covered with batter, butter, creamy sauces, or cheese. Fried foods. Sugary drinks and desserts. Foods that cause gas or bloating   This information is not intended to replace advice given to you by your health care provider. Make sure you discuss any questions you have with your health care provider.   Document Released: 05/04/2013 Document Reviewed: 05/04/2013 Elsevier Interactive Patient Education [employer].

## 2023-04-14 NOTE — H&P (View-Only) (Signed)
04/14/2023  History of Present Illness: [name] is a 72 y.o. male with a recent history of acute cholecystitis and bronchitis on 02/15/2023.  Due to his bronchitis, no surgery was done at the time and the patient also did not require percutaneous drainage.  The patient currently is scheduled for robotic assisted cholecystectomy on 04/17/2023 he presents today for H&P update.  Patient reports that he has had some intermittent discomfort in the right upper quadrant but denies any worsening pain.  Has not had any issues with coughing or congestion recently and has not required oxygen recently as well.  He has stopped his aspirin in preparation for surgery.  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    COPD (chronic obstructive pulmonary disease) (HCC)    Depression 08/19/2017   Hepatitis C    Hypertension    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HCC)    Seizures (HCC) 2010   Sleep apnea    Syncope    questionable vasovagal     Past Surgical History: Past Surgical History:  Procedure Laterality Date   COLONOSCOPY WITH PROPOFOL N/A 12/16/2016   Procedure: COLONOSCOPY WITH PROPOFOL;  Surgeon: Christena Deem, MD;  Location: [facility] ENDOSCOPY;  Service: Endoscopy;  Laterality: N/A;   COLONOSCOPY WITH PROPOFOL N/A 03/14/2017   Procedure: COLONOSCOPY WITH PROPOFOL;  Surgeon: Christena Deem, MD;  Location: [facility] ENDOSCOPY;  Service: Endoscopy;  Laterality: N/A;   COLONOSCOPY WITH PROPOFOL     FOOT SURGERY Right 03/12/2002   FOOT SURGERY     HEMORRHOID SURGERY     LITHOTRIPSY     MYRINGOTOMY     MYRINGOTOMY WITH TUBE PLACEMENT Left 04/28/2019   Procedure: MYRINGOTOMY WITH TUBE PLACEMENT;  Surgeon: Bud Face, MD;  Location: [facility];  Service: ENT;  Laterality: Left;   NASOPHARYNGOSCOPY N/A 04/28/2019    Procedure: NASOPHARYNGOSCOPY WITH BIOPSY;  Surgeon: Bud Face, MD;  Location: [facility];  Service: ENT;  Laterality: N/A;   NASOPHARYNGOSCOPY     Port a cath Placement     PORTA CATH INSERTION N/A 05/20/2019   Procedure: PORTA CATH INSERTION;  Surgeon: Annice Needy, MD;  Location: [facility] INVASIVE CV LAB;  Service: Cardiovascular;  Laterality: N/A;   PORTA CATH REMOVAL N/A 11/29/2020   Procedure: PORTA CATH REMOVAL;  Surgeon: Annice Needy, MD;  Location: [facility] INVASIVE CV LAB;  Service: Cardiovascular;  Laterality: N/A;   TONSILLECTOMY      Home Medications: Prior to Admission medications   Medication Sig Start Date End Date Taking? Authorizing Provider  acetaminophen (TYLENOL) 500 MG tablet Take 2 tablets (1,000 mg total) by mouth every 6 (six) hours as needed for mild pain. 02/17/23  Yes Elin Seats, Elita Quick, MD  amitriptyline (ELAVIL) 75 MG tablet Take 75 mg by mouth at bedtime. 02/20/23  Yes [provider]  amLODipine (NORVASC) 10 MG tablet Take 1 tablet (10 mg total) by mouth daily. 01/24/23  Yes [name], FNP  antiseptic oral rinse (BIOTENE) LIQD 15 mLs by Mouth Rinse route as needed for dry mouth.   Yes [provider]  aspirin EC 81 MG tablet Take 81 mg by mouth daily. Swallow whole.   Yes [provider]  Blood Glucose Monitoring Suppl DEVI 1 each by Does not apply route in the morning, at noon,  and at bedtime. May substitute to any manufacturer covered by patient's insurance. Pt's current glucometer is broken. 10/28/22  Yes Drubel, Lillia Abed, PA-C  buprenorphine (BUTRANS) 15 MCG/HR Place 1 patch onto the skin once a week. 01/26/23 04/20/23 Yes Edward Jolly, MD  ibuprofen (ADVIL) 600 MG tablet Take 1 tablet (600 mg total) by mouth every 8 (eight) hours as needed for moderate pain. 02/17/23  Yes Kyani Simkin, MD  Ipratropium-Albuterol (COMBIVENT RESPIMAT) 20-100 MCG/ACT AERS respimat Inhale 1 puff into the lungs every 6 (six) hours as needed. Rescue  inhaler 05/09/22  Yes Lyndon Code, MD  nystatin (MYCOSTATIN) 100000 UNIT/ML suspension Take 5 mLs by mouth daily as needed (thrush). 02/06/23  Yes [provider]  omeprazole (PRILOSEC) 40 MG capsule TAKE 1 CAPSULE BY MOUTH ONCE DAILY 10/23/22  Yes Drubel, Lillia Abed, PA-C  rosuvastatin (CRESTOR) 40 MG tablet Take 1 tablet (40 mg total) by mouth daily. 01/24/23  Yes [name], FNP    Allergies: Allergies  Allergen Reactions   Opana [Oxymorphone Hcl]     Made him BLACKOUT   Elemental Sulfur     Childhood reaction    Sulfa Antibiotics Other (See Comments)   Sulfa Antibiotics     Review of Systems: Review of Systems  Constitutional:  Negative for chills and fever.  Respiratory:  Negative for shortness of breath.   Cardiovascular:  Negative for chest pain.  Gastrointestinal:  Positive for abdominal pain. Negative for nausea and vomiting.    Physical Exam BP (!) 151/93   Pulse (!) 102   Temp 97.9 F (36.6 C) (Oral)   Ht 5\' 9"  (1.753 m)   Wt 206 lb 6.4 oz (93.6 kg)   SpO2 99%   BMI 30.48 kg/m  CONSTITUTIONAL: No acute distress, well-nourished HEENT:  Normocephalic, atraumatic, extraocular motion intact. RESPIRATORY:  Lungs are clear, and breath sounds are equal bilaterally. Normal respiratory effort without pathologic use of accessory muscles. CARDIOVASCULAR: Heart is regular without murmurs, gallops, or rubs. GI: The abdomen is soft, nondistended, currently nontender to palpation.  Negative Murphy's sign.  NEUROLOGIC:  Motor and sensation is grossly normal.  Cranial nerves are grossly intact. PSYCH:  Alert and oriented to person, place and time. Affect is normal.  Labs/Imaging: None new  Assessment and Plan: This is a 72 y.o. male with acute cholecystitis.  - Patient has been doing well after his hospital admission and his bronchitis has fully resolved and has not had any oxygen requirements recently.  He does have still some intermittent discomfort depending on  what he eats but has not had any worsening pain like what brought him to the emergency room in October.  For now, I think we can proceed with surgery as scheduled. - Discussed with him again the plan for robotic assisted cholecystectomy and reviewed the surgery at length with him again including the planned incision, risks, postoperative activity restrictions, pain control, and he is willing to proceed. - Patient scheduled for surgery on 04/17/2023.  All of his questions have been answered.  I spent 20 minutes dedicated to the care of this patient on the date of this encounter to include pre-visit review of records, face-to-face time with the patient discussing diagnosis and management, and any post-visit coordination of care.   Howie Ill, MD [facility]

## 2023-04-14 NOTE — Progress Notes (Signed)
04/14/2023  History of Present Illness: [name] is a 72 y.o. male with a recent history of acute cholecystitis and bronchitis on 02/15/2023.  Due to his bronchitis, no surgery was done at the time and the patient also did not require percutaneous drainage.  The patient currently is scheduled for robotic assisted cholecystectomy on 04/17/2023 he presents today for H&P update.  Patient reports that he has had some intermittent discomfort in the right upper quadrant but denies any worsening pain.  Has not had any issues with coughing or congestion recently and has not required oxygen recently as well.  He has stopped his aspirin in preparation for surgery.  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    COPD (chronic obstructive pulmonary disease) (HCC)    Depression 08/19/2017   Hepatitis C    Hypertension    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HCC)    Seizures (HCC) 2010   Sleep apnea    Syncope    questionable vasovagal     Past Surgical History: Past Surgical History:  Procedure Laterality Date   COLONOSCOPY WITH PROPOFOL N/A 12/16/2016   Procedure: COLONOSCOPY WITH PROPOFOL;  Surgeon: Christena Deem, MD;  Location: [facility] END[facility];  Service: Endoscopy;  Laterality: N/A;   COLONOSCOPY WITH PROPOFOL N/A 03/14/2017   Procedure: COLONOSCOPY WITH PROPOFOL;  Surgeon: Christena Deem, MD;  Location: [facility] ENDOSCOPY;  Service: Endoscopy;  Laterality: N/A;   COLONOSCOPY WITH PROPOFOL     FOOT SURGERY Right 03/12/2002   FOOT SURGERY     HEMORRHOID SURGERY     LITHOTRIPSY     MYRINGOTOMY     MYRINGOTOMY WITH TUBE PLACEMENT Left 04/28/2019   Procedure: MYRINGOTOMY WITH TUBE PLACEMENT;  Surgeon: Bud Face, MD;  Location: [facility];  Service: ENT;  Laterality: Left;   NASOPHARYNGOSCOPY N/A 04/28/2019    Procedure: NASOPHARYNGOSCOPY WITH BIOPSY;  Surgeon: Bud Face, MD;  Location: [facility];  Service: ENT;  Laterality: N/A;   NASOPHARYNGOSCOPY     Port a cath Placement     PORTA CATH INSERTION N/A 05/20/2019   Procedure: PORTA CATH INSERTION;  Surgeon: Annice Needy, MD;  Location: [facility] INVASIVE CV LAB;  Service: Cardiovascular;  Laterality: N/A;   PORTA CATH REMOVAL N/A 11/29/2020   Procedure: PORTA CATH REMOVAL;  Surgeon: Annice Needy, MD;  Location: [facility] INV[facility] CV [facility];  Service: Cardiovascular;  Laterality: N/A;   TONSILLECTOMY      Home Medications: Prior to Admission medications   Medication Sig Start Date End Date Taking? Authorizing Provider  acetaminophen (TYLENOL) 500 MG tablet Take 2 tablets (1,000 mg total) by mouth every 6 (six) hours as needed for mild pain. 02/17/23  Yes Elin Seats, Elita Quick, MD  amitriptyline (ELAVIL) 75 MG tablet Take 75 mg by mouth at bedtime. 02/20/23  Yes [provider]  amLODipine (NORVASC) 10 MG tablet Take 1 tablet (10 mg total) by mouth daily. 01/24/23  Yes [name], FNP  antiseptic oral rinse (BIOTENE) LIQD 15 mLs by Mouth Rinse route as needed for dry mouth.   Yes [provider]  aspirin EC 81 MG tablet Take 81 mg by mouth daily. Swallow whole.   Yes [provider]  Blood Glucose Monitoring Suppl DEVI 1 each by Does not apply route in the morning, at noon,  and at bedtime. May substitute to any manufacturer covered by patient's insurance. Pt's current glucometer is broken. 10/28/22  Yes Drubel, Lillia Abed, PA-C  buprenorphine (BUTRANS) 15 MCG/HR Place 1 patch onto the skin once a week. 01/26/23 04/20/23 Yes Edward Jolly, MD  ibuprofen (ADVIL) 600 MG tablet Take 1 tablet (600 mg total) by mouth every 8 (eight) hours as needed for moderate pain. 02/17/23  Yes Kyani Simkin, MD  Ipratropium-Albuterol (COMBIVENT RESPIMAT) 20-100 MCG/ACT AERS respimat Inhale 1 puff into the lungs every 6 (six) hours as needed. Rescue  inhaler 05/09/22  Yes Lyndon Code, MD  nystatin (MYCOSTATIN) 100000 UNIT/ML suspension Take 5 mLs by mouth daily as needed (thrush). 02/06/23  Yes [provider]  omeprazole (PRILOSEC) 40 MG capsule TAKE 1 CAPSULE BY MOUTH ONCE DAILY 10/23/22  Yes Drubel, Lillia Abed, PA-C  rosuvastatin (CRESTOR) 40 MG tablet Take 1 tablet (40 mg total) by mouth daily. 01/24/23  Yes [name], FNP    Allergies: Allergies  Allergen Reactions   Opana [Oxymorphone Hcl]     Made him BLACKOUT   Elemental Sulfur     Childhood reaction    Sulfa Antibiotics Other (See Comments)   Sulfa Antibiotics     Review of Systems: Review of Systems  Constitutional:  Negative for chills and fever.  Respiratory:  Negative for shortness of breath.   Cardiovascular:  Negative for chest pain.  Gastrointestinal:  Positive for abdominal pain. Negative for nausea and vomiting.    Physical Exam BP (!) 151/93   Pulse (!) 102   Temp 97.9 F (36.6 C) (Oral)   Ht 5\' 9"  (1.753 m)   Wt 206 lb 6.4 oz (93.6 kg)   SpO2 99%   BMI 30.48 kg/m  CONSTITUTIONAL: No acute distress, well-nourished HEENT:  Normocephalic, atraumatic, extraocular motion intact. RESPIRATORY:  Lungs are clear, and breath sounds are equal bilaterally. Normal respiratory effort without pathologic use of accessory muscles. CARDIOVASCULAR: Heart is regular without murmurs, gallops, or rubs. GI: The abdomen is soft, nondistended, currently nontender to palpation.  Negative Murphy's sign.  NEUROLOGIC:  Motor and sensation is grossly normal.  Cranial nerves are grossly intact. PSYCH:  Alert and oriented to person, place and time. Affect is normal.  Labs/Imaging: None new  Assessment and Plan: This is a 72 y.o. male with acute cholecystitis.  - Patient has been doing well after his hospital admission and his bronchitis has fully resolved and has not had any oxygen requirements recently.  He does have still some intermittent discomfort depending on  what he eats but has not had any worsening pain like what brought him to the emergency room in October.  For now, I think we can proceed with surgery as scheduled. - Discussed with him again the plan for robotic assisted cholecystectomy and reviewed the surgery at length with him again including the planned incision, risks, postoperative activity restrictions, pain control, and he is willing to proceed. - Patient scheduled for surgery on 04/17/2023.  All of his questions have been answered.  I spent 20 minutes dedicated to the care of this patient on the date of this encounter to include pre-visit review of records, face-to-face time with the patient discussing diagnosis and management, and any post-visit coordination of care.   Howie Ill, MD [facility]

## 2023-04-16 MED ORDER — CHLORHEXIDINE GLUCONATE 0.12 % MT SOLN
15.0000 mL | Freq: Once | OROMUCOSAL | Status: AC
  Administered 2023-04-17: 15 mL via OROMUCOSAL

## 2023-04-16 MED ORDER — SODIUM CHLORIDE 0.9 % IV SOLN: INTRAVENOUS | Status: DC

## 2023-04-16 MED ORDER — BUPIVACAINE LIPOSOME 1.3 % IJ SUSP: 20.0000 mL | Freq: Once | INTRAMUSCULAR | Status: DC

## 2023-04-16 MED ORDER — ORAL CARE MOUTH RINSE: 15.0000 mL | Freq: Once | OROMUCOSAL | Status: AC

## 2023-04-16 MED ORDER — ACETAMINOPHEN 500 MG PO TABS
1000.0000 mg | ORAL_TABLET | ORAL | Status: AC
  Administered 2023-04-17: 1000 mg via ORAL

## 2023-04-16 MED ORDER — CEFAZOLIN SODIUM-DEXTROSE 2-4 GM/100ML-% IV SOLN
2.0000 g | INTRAVENOUS | Status: AC
  Administered 2023-04-17: 2 g via INTRAVENOUS

## 2023-04-16 MED ORDER — GABAPENTIN 300 MG PO CAPS
300.0000 mg | ORAL_CAPSULE | ORAL | Status: AC
  Administered 2023-04-17: 300 mg via ORAL

## 2023-04-16 MED ORDER — CHLORHEXIDINE GLUCONATE CLOTH 2 % EX PADS: 6.0000 | MEDICATED_PAD | Freq: Once | CUTANEOUS | Status: DC

## 2023-04-16 MED ORDER — INDOCYANINE GREEN 25 MG IV SOLR
2.5000 mg | INTRAVENOUS | Status: AC
Start: 2023-04-16 — End: 2023-04-17
  Administered 2023-04-17: 2.5 mg via INTRAVENOUS
  Filled 2023-04-16 (×2): qty 1

## 2023-04-17 ENCOUNTER — Other Ambulatory Visit: Payer: Self-pay

## 2023-04-17 ENCOUNTER — Ambulatory Visit: Payer: Medicare HMO

## 2023-04-17 ENCOUNTER — Encounter: Payer: Self-pay | Admitting: Surgery

## 2023-04-17 ENCOUNTER — Ambulatory Visit
Admission: RE | Admit: 2023-04-17 | Discharge: 2023-04-17 | Disposition: A | Discharge status: Home / Self Care | Payer: Medicare HMO | Source: Ambulatory Visit | Attending: Surgery | Admitting: Surgery

## 2023-04-17 ENCOUNTER — Ambulatory Visit: Payer: Medicare HMO | Admitting: Urgent Care

## 2023-04-17 ENCOUNTER — Encounter
Admission: RE | Disposition: A | Discharge status: Home / Self Care | Payer: Self-pay | Source: Ambulatory Visit | Attending: Surgery

## 2023-04-17 DIAGNOSIS — K810 Acute cholecystitis: Secondary | ICD-10-CM | POA: Diagnosis not present

## 2023-04-17 DIAGNOSIS — I10 Essential (primary) hypertension: Secondary | ICD-10-CM | POA: Insufficient documentation

## 2023-04-17 DIAGNOSIS — K219 Gastro-esophageal reflux disease without esophagitis: Secondary | ICD-10-CM | POA: Diagnosis not present

## 2023-04-17 DIAGNOSIS — J449 Chronic obstructive pulmonary disease, unspecified: Secondary | ICD-10-CM | POA: Insufficient documentation

## 2023-04-17 DIAGNOSIS — F32A Depression, unspecified: Secondary | ICD-10-CM | POA: Diagnosis not present

## 2023-04-17 DIAGNOSIS — K8012 Calculus of gallbladder with acute and chronic cholecystitis without obstruction: Secondary | ICD-10-CM | POA: Insufficient documentation

## 2023-04-17 DIAGNOSIS — Z79899 Other long term (current) drug therapy: Secondary | ICD-10-CM | POA: Diagnosis not present

## 2023-04-17 DIAGNOSIS — K819 Cholecystitis, unspecified: Secondary | ICD-10-CM | POA: Diagnosis not present

## 2023-04-17 DIAGNOSIS — Z7982 Long term (current) use of aspirin: Secondary | ICD-10-CM | POA: Insufficient documentation

## 2023-04-17 DIAGNOSIS — Z87891 Personal history of nicotine dependence: Secondary | ICD-10-CM | POA: Insufficient documentation

## 2023-04-17 DIAGNOSIS — G4730 Sleep apnea, unspecified: Secondary | ICD-10-CM | POA: Insufficient documentation

## 2023-04-17 DIAGNOSIS — G5641 Causalgia of right upper limb: Secondary | ICD-10-CM

## 2023-04-17 DIAGNOSIS — K828 Other specified diseases of gallbladder: Secondary | ICD-10-CM | POA: Diagnosis not present

## 2023-04-17 LAB — GLUCOSE, CAPILLARY
Glucose-Capillary: 115 mg/dL — ABNORMAL HIGH (ref 70–99)
Glucose-Capillary: 181 mg/dL — ABNORMAL HIGH (ref 70–99)

## 2023-04-17 SURGERY — CHOLECYSTECTOMY, ROBOT-ASSISTED, LAPAROSCOPIC
Anesthesia: General

## 2023-04-17 MED ORDER — FENTANYL CITRATE (PF) 100 MCG/2ML IJ SOLN
INTRAMUSCULAR | Status: AC
  Filled 2023-04-17: qty 2

## 2023-04-17 MED ORDER — BUPIVACAINE LIPOSOME 1.3 % IJ SUSP
INTRAMUSCULAR | Status: AC
  Filled 2023-04-17: qty 20

## 2023-04-17 MED ORDER — OXYCODONE HCL 5 MG PO TABS
5.0000 mg | ORAL_TABLET | Freq: Once | ORAL | Status: AC
  Administered 2023-04-17: 5 mg via ORAL

## 2023-04-17 MED ORDER — DEXAMETHASONE SODIUM PHOSPHATE 10 MG/ML IJ SOLN
INTRAMUSCULAR | Status: DC | PRN
  Administered 2023-04-17: 10 mg via INTRAVENOUS

## 2023-04-17 MED ORDER — DEXAMETHASONE SODIUM PHOSPHATE 10 MG/ML IJ SOLN
INTRAMUSCULAR | Status: AC
  Filled 2023-04-17: qty 1

## 2023-04-17 MED ORDER — PHENYLEPHRINE 80 MCG/ML (10ML) SYRINGE FOR IV PUSH (FOR BLOOD PRESSURE SUPPORT)
PREFILLED_SYRINGE | INTRAVENOUS | Status: DC | PRN
  Administered 2023-04-17: 80 ug via INTRAVENOUS

## 2023-04-17 MED ORDER — EPHEDRINE SULFATE-NACL 50-0.9 MG/10ML-% IV SOSY
PREFILLED_SYRINGE | INTRAVENOUS | Status: DC | PRN
  Administered 2023-04-17 (×3): 5 mg via INTRAVENOUS
  Administered 2023-04-17: 10 mg via INTRAVENOUS

## 2023-04-17 MED ORDER — ONDANSETRON HCL 4 MG/2ML IJ SOLN
INTRAMUSCULAR | Status: DC | PRN
  Administered 2023-04-17: 4 mg via INTRAVENOUS

## 2023-04-17 MED ORDER — ONDANSETRON HCL 4 MG/2ML IJ SOLN: 4.0000 mg | Freq: Once | INTRAMUSCULAR | Status: DC | PRN

## 2023-04-17 MED ORDER — FENTANYL CITRATE (PF) 100 MCG/2ML IJ SOLN
25.0000 ug | INTRAMUSCULAR | Status: DC | PRN
  Administered 2023-04-17: 25 ug via INTRAVENOUS
  Administered 2023-04-17: 50 ug via INTRAVENOUS
  Administered 2023-04-17: 25 ug via INTRAVENOUS

## 2023-04-17 MED ORDER — VASOPRESSIN 20 UNIT/ML IV SOLN
INTRAVENOUS | Status: DC | PRN
  Administered 2023-04-17: 1 [IU] via INTRAVENOUS
  Administered 2023-04-17: 2 [IU] via INTRAVENOUS
  Administered 2023-04-17: 1 [IU] via INTRAVENOUS

## 2023-04-17 MED ORDER — GABAPENTIN 300 MG PO CAPS
ORAL_CAPSULE | ORAL | Status: AC
  Filled 2023-04-17: qty 1

## 2023-04-17 MED ORDER — EPHEDRINE 5 MG/ML INJ
INTRAVENOUS | Status: AC
  Filled 2023-04-17: qty 5

## 2023-04-17 MED ORDER — ACETAMINOPHEN 10 MG/ML IV SOLN: 1000.0000 mg | Freq: Once | INTRAVENOUS | Status: DC | PRN

## 2023-04-17 MED ORDER — GABAPENTIN 300 MG PO CAPS: 300.0000 mg | ORAL_CAPSULE | Freq: Two times a day (BID) | ORAL | 0 refills | Status: DC

## 2023-04-17 MED ORDER — BUPIVACAINE LIPOSOME 1.3 % IJ SUSP
INTRAMUSCULAR | Status: AC
  Filled 2023-04-17: qty 10

## 2023-04-17 MED ORDER — PROPOFOL 10 MG/ML IV BOLUS
INTRAVENOUS | Status: DC | PRN
  Administered 2023-04-17: 200 mg via INTRAVENOUS

## 2023-04-17 MED ORDER — ROCURONIUM BROMIDE 10 MG/ML (PF) SYRINGE
PREFILLED_SYRINGE | INTRAVENOUS | Status: AC
  Filled 2023-04-17: qty 10

## 2023-04-17 MED ORDER — PHENYLEPHRINE HCL-NACL 20-0.9 MG/250ML-% IV SOLN
INTRAVENOUS | Status: DC | PRN
  Administered 2023-04-17: 30 ug/min via INTRAVENOUS

## 2023-04-17 MED ORDER — OXYCODONE HCL 5 MG PO TABS
ORAL_TABLET | ORAL | Status: AC
  Filled 2023-04-17: qty 1

## 2023-04-17 MED ORDER — BUPIVACAINE LIPOSOME 1.3 % IJ SUSP
INTRAMUSCULAR | Status: DC | PRN
  Administered 2023-04-17: 10 mL

## 2023-04-17 MED ORDER — ACETAMINOPHEN 500 MG PO TABS
ORAL_TABLET | ORAL | Status: AC
  Filled 2023-04-17: qty 2

## 2023-04-17 MED ORDER — SUGAMMADEX SODIUM 200 MG/2ML IV SOLN
INTRAVENOUS | Status: DC | PRN
  Administered 2023-04-17: 200 mg via INTRAVENOUS

## 2023-04-17 MED ORDER — LIDOCAINE HCL (PF) 2 % IJ SOLN
INTRAMUSCULAR | Status: AC
  Filled 2023-04-17: qty 5

## 2023-04-17 MED ORDER — ONDANSETRON HCL 4 MG/2ML IJ SOLN
INTRAMUSCULAR | Status: AC
Start: 2023-04-17 — End: ?
  Filled 2023-04-17: qty 2

## 2023-04-17 MED ORDER — CHLORHEXIDINE GLUCONATE 0.12 % MT SOLN
OROMUCOSAL | Status: AC
  Filled 2023-04-17: qty 15

## 2023-04-17 MED ORDER — IBUPROFEN 600 MG PO TABS: 600.0000 mg | ORAL_TABLET | Freq: Three times a day (TID) | ORAL | 1 refills | Status: DC | PRN

## 2023-04-17 MED ORDER — PHENYLEPHRINE HCL-NACL 20-0.9 MG/250ML-% IV SOLN
INTRAVENOUS | Status: AC
Start: 2023-04-17 — End: ?
  Filled 2023-04-17: qty 250

## 2023-04-17 MED ORDER — BUPIVACAINE-EPINEPHRINE (PF) 0.25% -1:200000 IJ SOLN
INTRAMUSCULAR | Status: DC | PRN
  Administered 2023-04-17: 30 mL

## 2023-04-17 MED ORDER — FENTANYL CITRATE (PF) 100 MCG/2ML IJ SOLN
INTRAMUSCULAR | Status: DC | PRN
  Administered 2023-04-17: 100 ug via INTRAVENOUS

## 2023-04-17 MED ORDER — PHENYLEPHRINE 80 MCG/ML (10ML) SYRINGE FOR IV PUSH (FOR BLOOD PRESSURE SUPPORT)
PREFILLED_SYRINGE | INTRAVENOUS | Status: AC
  Filled 2023-04-17: qty 10

## 2023-04-17 MED ORDER — ACETAMINOPHEN 500 MG PO TABS: 1000.0000 mg | ORAL_TABLET | Freq: Four times a day (QID) | ORAL | Status: AC | PRN

## 2023-04-17 MED ORDER — MIDAZOLAM HCL 2 MG/2ML IJ SOLN
INTRAMUSCULAR | Status: AC
  Filled 2023-04-17: qty 2

## 2023-04-17 MED ORDER — BUPIVACAINE-EPINEPHRINE (PF) 0.25% -1:200000 IJ SOLN
INTRAMUSCULAR | Status: AC
  Filled 2023-04-17: qty 30

## 2023-04-17 MED ORDER — MIDAZOLAM HCL 2 MG/2ML IJ SOLN
INTRAMUSCULAR | Status: DC | PRN
  Administered 2023-04-17: 2 mg via INTRAVENOUS

## 2023-04-17 MED ORDER — BUPIVACAINE-EPINEPHRINE (PF) 0.25% -1:200000 IJ SOLN
INTRAMUSCULAR | Status: AC
Start: 2023-04-17 — End: ?
  Filled 2023-04-17: qty 30

## 2023-04-17 MED ORDER — CEFAZOLIN SODIUM-DEXTROSE 2-4 GM/100ML-% IV SOLN
INTRAVENOUS | Status: AC
  Filled 2023-04-17: qty 100

## 2023-04-17 MED ORDER — PROPOFOL 10 MG/ML IV BOLUS
INTRAVENOUS | Status: AC
  Filled 2023-04-17: qty 20

## 2023-04-17 MED ORDER — ROCURONIUM BROMIDE 100 MG/10ML IV SOLN
INTRAVENOUS | Status: DC | PRN
  Administered 2023-04-17: 10 mg via INTRAVENOUS
  Administered 2023-04-17: 60 mg via INTRAVENOUS

## 2023-04-17 MED ORDER — LIDOCAINE HCL (CARDIAC) PF 100 MG/5ML IV SOSY
PREFILLED_SYRINGE | INTRAVENOUS | Status: DC | PRN
  Administered 2023-04-17: 100 mg via INTRAVENOUS

## 2023-04-17 SURGICAL SUPPLY — 42 items
BAG PRESSURE INF REUSE 1000 (BAG) IMPLANT
CANNULA CAP OBTURATR AIRSEAL 8 (CAP) IMPLANT
CAUTERY HOOK MNPLR 1.6 DVNC XI (INSTRUMENTS) ×2 IMPLANT
CLIP LIGATING HEMO O LOK GREEN (MISCELLANEOUS) ×2 IMPLANT
DERMABOND ADVANCED .7 DNX12 (GAUZE/BANDAGES/DRESSINGS) ×2 IMPLANT
DRAPE ARM DVNC X/XI (DISPOSABLE) ×8 IMPLANT
DRAPE COLUMN DVNC XI (DISPOSABLE) ×2 IMPLANT
ELECT CAUTERY BLADE TIP 2.5 (TIP) ×1
ELECT REM PT RETURN 9FT ADLT (ELECTROSURGICAL) ×1
ELECTRODE CAUTERY BLDE TIP 2.5 (TIP) ×2 IMPLANT
ELECTRODE REM PT RTRN 9FT ADLT (ELECTROSURGICAL) ×2 IMPLANT
FORCEPS BPLR R/ABLATION 8 DVNC (INSTRUMENTS) ×2 IMPLANT
FORCEPS PROGRASP DVNC XI (FORCEP) ×2 IMPLANT
GLOVE SURG SYN 7.0 (GLOVE) ×4
GLOVE SURG SYN 7.0 PF PI (GLOVE) ×4 IMPLANT
GLOVE SURG SYN 7.5 E (GLOVE) ×4
GLOVE SURG SYN 7.5 PF PI (GLOVE) ×4 IMPLANT
GOWN STRL REUS W/ TWL LRG LVL3 (GOWN DISPOSABLE) ×8 IMPLANT
IRRIGATOR SUCT 8 DISP DVNC XI (IRRIGATION / IRRIGATOR) IMPLANT
IV NS 1000ML BAXH (IV SOLUTION) IMPLANT
KIT PINK PAD W/HEAD ARE REST (MISCELLANEOUS) ×1
KIT PINK PAD W/HEAD ARM REST (MISCELLANEOUS) ×2 IMPLANT
LABEL OR SOLS (LABEL) ×2 IMPLANT
MANIFOLD NEPTUNE II (INSTRUMENTS) ×2 IMPLANT
NDL HYPO 22X1.5 SAFETY MO (MISCELLANEOUS) ×2 IMPLANT
NEEDLE HYPO 22X1.5 SAFETY MO (MISCELLANEOUS) ×1
NS IRRIG 500ML POUR BTL (IV SOLUTION) ×2 IMPLANT
OBTURATOR OPTICAL STND 8 DVNC (TROCAR) ×1
OBTURATOR OPTICALSTD 8 DVNC (TROCAR) ×2 IMPLANT
PACK LAP CHOLECYSTECTOMY (MISCELLANEOUS) ×2 IMPLANT
SEAL UNIV 5-12 XI (MISCELLANEOUS) ×8 IMPLANT
SET TUBE FILTERED XL AIRSEAL (SET/KITS/TRAYS/PACK) IMPLANT
SET TUBE SMOKE EVAC HIGH FLOW (TUBING) ×2 IMPLANT
SOL ELECTROSURG ANTI STICK (MISCELLANEOUS) ×1
SOLUTION ELECTROSURG ANTI STCK (MISCELLANEOUS) ×2 IMPLANT
SPIKE FLUID TRANSFER (MISCELLANEOUS) ×2 IMPLANT
SUT MNCRL AB 4-0 PS2 18 (SUTURE) ×2 IMPLANT
SUT VIC AB 3-0 SH 27X BRD (SUTURE) IMPLANT
SUT VICRYL 0 UR6 27IN ABS (SUTURE) ×4 IMPLANT
SYS BAG RETRIEVAL 10MM (BASKET) ×1
SYSTEM BAG RETRIEVAL 10MM (BASKET) ×2 IMPLANT
WATER STERILE IRR 500ML POUR (IV SOLUTION) ×2 IMPLANT

## 2023-04-17 NOTE — Op Note (Signed)
  Procedure Date:  04/17/2023  Pre-operative Diagnosis:  Acute cholecystitis  Post-operative Diagnosis: Acute cholecystitis  Procedure:  Robotic assisted cholecystectomy with ICG FireFly cholangiogram  Surgeon:  Howie Ill, MD  Anesthesia:  General endotracheal  Estimated Blood Loss:  30 ml  Specimens:  gallbladder  Complications:  None  Indications for Procedure:  This is a 72 y.o. male admitted on 02/15/23 with acute cholecystitis and bronchitis.  He now presents for interval cholecystectomy.  The benefits, complications, treatment options, and expected outcomes were discussed with the patient. The risks of bleeding, infection, recurrence of symptoms, failure to resolve symptoms, bile duct damage, bile duct leak, retained common bile duct stone, bowel injury, and need for further procedures were all discussed with the patient and he was willing to proceed.  Description of Procedure: The patient was correctly identified in the preoperative area and brought into the operating room.  The patient was placed supine with VTE prophylaxis in place.  Appropriate time-outs were performed.  Anesthesia was induced and the patient was intubated.  Appropriate antibiotics were infused.  The abdomen was prepped and draped in a sterile fashion. An infraumbilical incision was made. A cutdown technique was used to enter the abdominal cavity without injury, and a 12 mm robotic port was inserted.  Pneumoperitoneum was obtained with appropriate opening pressures.  Three 8-mm ports were placed in the mid abdomen at the level of the umbilicus under direct visualization.  The DaVinci platform was docked, camera targeted, and instruments were placed under direct visualization.  The gallbladder was identified.  There were significant adhesions from the omentum to the gallbladder, requiring extensive lysis of adhesions taking more than 30 minutes.  The adhesions were combination of thick and thin, but cautery  was required for all dissection.  After this was dissected, the fundus was grasped and retracted cephalad.  Adhesions were lysed bluntly and with electrocautery. The infundibulum was grasped and retracted laterally, exposing the peritoneum overlying the gallbladder.  This was incised with electrocautery and extended on either side of the gallbladder.  FireFly cholangiogram was then obtained, and we were able to clearly identify the cystic duct and common bile duct.  There was still a lot of edema and also thickening at the area of the cystic duct and artery, requiring very meticulous dissection.  The cystic duct and cystic artery were carefully dissected with combination of cautery and blunt dissection.  Both were clipped twice proximally and once distally, cutting in between.  The gallbladder was taken from the gallbladder fossa in a retrograde fashion with electrocautery. The gallbladder was placed in an Endocatch bag. The liver bed was inspected and any bleeding was controlled with electrocautery. The right upper quadrant was then inspected again revealing intact clips, no bleeding, and no ductal injury.  The area was thoroughly irrigated.  The 8 mm ports were removed under direct visualization and the 12 mm port was removed.  The Endocatch bag was brought out via the umbilical incision. The fascial opening was closed using 0 vicryl suture.  Local anesthetic was infused in all incisions and the incisions were closed with 4-0 Monocryl.  The wounds were cleaned and sealed with DermaBond.  The patient was emerged from anesthesia and extubated and brought to the recovery room for further management.  The patient tolerated the procedure well and all counts were correct at the end of the case.   Howie Ill, MD

## 2023-04-17 NOTE — Anesthesia Postprocedure Evaluation (Signed)
Anesthesia Post Note  Patient: [name]  Procedure(s) Performed: XI ROBOTIC ASSISTED LAPAROSCOPIC CHOLECYSTECTOMY INDOCYANINE GREEN FLUORESCENCE IMAGING (ICG)  Patient location during evaluation: PACU Anesthesia Type: General Level of consciousness: awake and alert Pain management: pain level controlled Vital Signs Assessment: post-procedure vital signs reviewed and stable Respiratory status: spontaneous breathing, nonlabored ventilation, respiratory function stable and patient connected to nasal cannula oxygen Cardiovascular status: blood pressure returned to baseline and stable Postop Assessment: no apparent nausea or vomiting Anesthetic complications: no   No notable events documented.   Last Vitals:  Vitals:   04/17/23 1214 04/17/23 1242  BP: (!) 163/74 (!) 148/73  Pulse: 82 84  Resp: 15 16  Temp: 36.6 C   SpO2: 94% 95%    Last Pain:  Vitals:   04/17/23 1242  TempSrc:   PainSc: 3                  Corinda Gubler

## 2023-04-17 NOTE — Anesthesia Preprocedure Evaluation (Signed)
Anesthesia Evaluation  Patient identified by MRN, date of birth, ID band Patient awake    Reviewed: Allergy & Precautions, NPO status , Patient's Chart, lab work & pertinent test results  History of Anesthesia Complications Negative for: history of anesthetic complications  Airway Mallampati: II  TM Distance: >3 FB Neck ROM: Full    Dental  (+) Edentulous Upper, Edentulous Lower   Pulmonary sleep apnea , COPD, Patient abstained from smoking.Not current smoker, former smoker   Pulmonary exam normal breath sounds clear to auscultation       Cardiovascular Exercise Tolerance: Good METShypertension, Pt. on medications (-) CAD and (-) Past MI (-) dysrhythmias  Rhythm:Regular Rate:Normal - Systolic murmurs    Neuro/Psych  PSYCHIATRIC DISORDERS  Depression       GI/Hepatic ,GERD  ,,(+)     (-) substance abuse    Endo/Other  neg diabetes    Renal/GU negative Renal ROS     Musculoskeletal   Abdominal   Peds  Hematology   Anesthesia Other Findings Past Medical History: No date: Alcohol abuse No date: Benzodiazepine dependence (HCC) No date: Benzodiazepine withdrawal (HCC) 2021: Cancer (HCC)     Comment:  Nasopharongeal No date: Chronic pain in right foot No date: COPD (chronic obstructive pulmonary disease) (HCC) 08/19/2017: Depression No date: Hepatitis C No date: Hypertension No date: 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HCC) 2010: Seizures (HCC) No date: Sleep apnea No date: Syncope     Comment:  questionable vasovagal  Reproductive/Obstetrics                             Anesthesia Physical Anesthesia Plan  ASA: 3  Anesthesia Plan: General   Post-op Pain Management: Tylenol PO (pre-op)* and Gabapentin PO (pre-op)*   Induction: Intravenous  PONV Risk Score and Plan: 3 and Ondansetron, Dexamethasone and Treatment may vary due to age or medical condition  Airway Management Planned: Oral ETT  and Video Laryngoscope Planned  Additional Equipment: None  Intra-op Plan:   Post-operative Plan: Extubation in OR  Informed Consent: I have reviewed the patients History and Physical, chart, labs and discussed the procedure including the risks, benefits and alternatives for the proposed anesthesia with the patient or authorized representative who has indicated his/her understanding and acceptance.     Dental advisory given  Plan Discussed with: CRNA and Surgeon  Anesthesia Plan Comments: (Discussed risks of anesthesia with patient, including PONV, sore throat, lip/dental/eye damage. Rare risks discussed as well, such as cardiorespiratory and neurological sequelae, and allergic reactions. Discussed the role of CRNA in patient's perioperative care. Patient understands.)       Anesthesia Quick Evaluation

## 2023-04-17 NOTE — Interval H&P Note (Signed)
History and Physical Interval Note:  04/17/2023 7:08 AM  [name]  has presented today for surgery, with the diagnosis of cholecystitis.  The various methods of treatment have been discussed with the patient and family. After consideration of risks, benefits and other options for treatment, the patient has consented to  Procedure(s): XI ROBOTIC ASSISTED LAPAROSCOPIC CHOLECYSTECTOMY (N/A) INDOCYANINE GREEN FLUORESCENCE IMAGING (ICG) (N/A) as a surgical intervention.  The patient's history has been reviewed, patient examined, no change in status, stable for surgery.  I have reviewed the patient's chart and labs.  Questions were answered to the patient's satisfaction.     Othmar Ringer

## 2023-04-17 NOTE — Anesthesia Procedure Notes (Signed)
Procedure Name: Intubation Date/Time: 04/17/2023 7:44 AM  Performed by: Elisabeth Pigeon, CRNAPre-anesthesia Checklist: Patient identified, Patient being monitored, Timeout performed, Emergency Drugs available and Suction available Patient Re-evaluated:Patient Re-evaluated prior to induction Oxygen Delivery Method: Circle system utilized Preoxygenation: Pre-oxygenation with 100% oxygen Induction Type: IV induction Ventilation: Mask ventilation without difficulty Laryngoscope Size: Mac, 4 and McGrath Grade View: Grade I Tube type: Oral Tube size: 7.0 mm Number of attempts: 1 Airway Equipment and Method: Stylet Placement Confirmation: ETT inserted through vocal cords under direct vision, positive ETCO2 and breath sounds checked- equal and bilateral Secured at: 22 cm Tube secured with: Tape Dental Injury: Teeth and Oropharynx as per pre-operative assessment

## 2023-04-17 NOTE — Discharge Instructions (Signed)
Discharge Instructions: 1.  Patient may shower, but do not scrub wounds heavily and dab dry only. 2.  Do not submerge wounds in pool/tub until fully healed. 3.  Do not apply ointments or hydrogen peroxide to the wounds. 4.  May apply ice packs to the wounds for comfort. 5.  May resume your Aspirin on 04/20/23. 6.  Do not drive while taking narcotics for pain control.  Prior to driving, make sure you are able to rotate right and left to look at blindspots without significant pain or discomfort. 7.  No heavy lifting or pushing of more than 10-15 lbs for 4 weeks.

## 2023-04-17 NOTE — Transfer of Care (Signed)
Immediate Anesthesia Transfer of Care Note  Patient: [name]  Procedure(s) Performed: XI ROBOTIC ASSISTED LAPAROSCOPIC CHOLECYSTECTOMY INDOCYANINE GREEN FLUORESCENCE IMAGING (ICG)  Patient Location: PACU  Anesthesia Type:General  Level of Consciousness: awake, alert , and oriented  Airway & Oxygen Therapy: Patient Spontanous Breathing and Patient connected to face mask oxygen  Post-op Assessment: Report given to RN and Post -op Vital signs reviewed and stable  Post vital signs: Reviewed and stable  Last Vitals:  Vitals Value Taken Time  BP 125/68 04/17/23 1045  Temp    Pulse 78 04/17/23 1048  Resp 18 04/17/23 1048  SpO2 100 % 04/17/23 1048  Vitals shown include unfiled device data.  Last Pain:  Vitals:   04/17/23 0617  TempSrc: Oral  PainSc: 2          Complications: No notable events documented.

## 2023-04-18 LAB — SURGICAL PATHOLOGY

## 2023-04-30 ENCOUNTER — Encounter: Payer: Medicare HMO | Admitting: Physician Assistant

## 2023-05-15 ENCOUNTER — Encounter: Payer: Self-pay | Admitting: Oncology

## 2023-05-15 NOTE — Telephone Encounter (Signed)
 Created in error

## 2023-05-22 ENCOUNTER — Other Ambulatory Visit: Payer: Self-pay | Admitting: Physician Assistant

## 2023-05-22 DIAGNOSIS — K219 Gastro-esophageal reflux disease without esophagitis: Secondary | ICD-10-CM | POA: Diagnosis not present

## 2023-05-22 DIAGNOSIS — J441 Chronic obstructive pulmonary disease with (acute) exacerbation: Secondary | ICD-10-CM | POA: Diagnosis not present

## 2023-05-22 DIAGNOSIS — C119 Malignant neoplasm of nasopharynx, unspecified: Secondary | ICD-10-CM | POA: Diagnosis not present

## 2023-06-11 ENCOUNTER — Other Ambulatory Visit: Payer: Self-pay | Admitting: Surgery

## 2023-06-20 ENCOUNTER — Encounter: Payer: Self-pay | Admitting: Oncology

## 2023-06-22 DIAGNOSIS — C119 Malignant neoplasm of nasopharynx, unspecified: Secondary | ICD-10-CM | POA: Diagnosis not present

## 2023-06-22 DIAGNOSIS — J441 Chronic obstructive pulmonary disease with (acute) exacerbation: Secondary | ICD-10-CM | POA: Diagnosis not present

## 2023-06-22 DIAGNOSIS — K219 Gastro-esophageal reflux disease without esophagitis: Secondary | ICD-10-CM | POA: Diagnosis not present

## 2023-06-25 ENCOUNTER — Ambulatory Visit (INDEPENDENT_AMBULATORY_CARE_PROVIDER_SITE_OTHER): Payer: Medicare HMO | Admitting: Family Medicine

## 2023-06-25 ENCOUNTER — Encounter: Payer: Self-pay | Admitting: Family Medicine

## 2023-06-25 VITALS — BP 186/78 | HR 71 | Ht 69.0 in | Wt 215.7 lb

## 2023-06-25 DIAGNOSIS — I10 Essential (primary) hypertension: Secondary | ICD-10-CM | POA: Diagnosis not present

## 2023-06-25 DIAGNOSIS — Z8719 Personal history of other diseases of the digestive system: Secondary | ICD-10-CM | POA: Diagnosis not present

## 2023-06-25 DIAGNOSIS — I152 Hypertension secondary to endocrine disorders: Secondary | ICD-10-CM

## 2023-06-25 DIAGNOSIS — E785 Hyperlipidemia, unspecified: Secondary | ICD-10-CM | POA: Diagnosis not present

## 2023-06-25 DIAGNOSIS — E1142 Type 2 diabetes mellitus with diabetic polyneuropathy: Secondary | ICD-10-CM | POA: Diagnosis not present

## 2023-06-25 DIAGNOSIS — B888 Other specified infestations: Secondary | ICD-10-CM | POA: Diagnosis not present

## 2023-06-25 DIAGNOSIS — Z130 Encounter for screening for diseases of the blood and blood-forming organs and certain disorders involving the immune mechanism: Secondary | ICD-10-CM

## 2023-06-25 DIAGNOSIS — E1169 Type 2 diabetes mellitus with other specified complication: Secondary | ICD-10-CM

## 2023-06-25 DIAGNOSIS — E1159 Type 2 diabetes mellitus with other circulatory complications: Secondary | ICD-10-CM | POA: Diagnosis not present

## 2023-06-25 DIAGNOSIS — E119 Type 2 diabetes mellitus without complications: Secondary | ICD-10-CM | POA: Diagnosis not present

## 2023-06-25 MED ORDER — LISINOPRIL 10 MG PO TABS: 10.0000 mg | ORAL_TABLET | Freq: Every day | ORAL | 1 refills | Status: DC

## 2023-06-25 MED ORDER — AMITRIPTYLINE HCL 75 MG PO TABS: 75.0000 mg | ORAL_TABLET | Freq: Every day | ORAL | 5 refills | Status: DC

## 2023-06-25 NOTE — Assessment & Plan Note (Signed)
Chronic On Crestor 40mg  daily Will check LP today Decrease saturated fats increase purposeful movement as tolerated

## 2023-06-25 NOTE — Assessment & Plan Note (Signed)
Last A1C was 6.3, indicating good control.  Recent weight gain noted. -Check labs today including A1C. -Encourage continued dietary modifications and weight management. Smaller meals with increase protein, vegetables, and fruits Decreased processed foods and saturated fats Referral for podiatry and Ophthalmology today Foot exam done On statin Starting ace for BP controll

## 2023-06-25 NOTE — Assessment & Plan Note (Addendum)
Complicated by hx of neuropathy d/t  nasopharyngeal cancer hx Pt sees Dr. Cherylann Ratel for pain mgmt Continue gabapentin for neuropathy relief  Continue Elavil 75 mg hs for neuropathy pain during sleep and mood mgmt

## 2023-06-25 NOTE — Assessment & Plan Note (Signed)
Acute on chronic, and has a follow up appt with exterminator tomorrow Fourth infestation per chart review Building manager aware and taking care of it Requested if pt needs more resources - pt declines Future referral for SW if continues

## 2023-06-25 NOTE — Assessment & Plan Note (Signed)
Gallbladder removal in December 2024 Pt healed, no concerning symptoms today Was seen by [facility] No current concerns

## 2023-06-25 NOTE — Assessment & Plan Note (Signed)
Blood pressure elevated at 172/82  and 186/78 despite being on Amlodipine 10mg  daily. -Start Lisinopril 10 mg daily given hx of DMII -Check blood pressure at home 2-3 times a week with upper arm cuff -low sodium diet -Follow up in 4 weeks to assess blood pressure control.

## 2023-06-25 NOTE — Progress Notes (Signed)
Established Patient Office Visit  Introduced to nurse practitioner role and practice setting.  All questions answered.  Discussed provider/patient relationship and expectations.  Subjective   Patient ID: [name], male    DOB: Aug 30, 1950  Age: 73 y.o. MRN: [id number]  Chief Complaint  Patient presents with   Medical Management of Chronic Issues    Diabetes follow up    [name] IV "[name]" is a 73 year old male with prediabetes who presents for follow-up on blood sugars.  He has a history of diabetes type 2,  managed through diet without medication. During a recent hospital stay for gallbladdder removal, elevated blood sugar levels were noted. His last A1c was 6.3, indicating controlled levels.   In the past, he experienced significant weight loss due to nasopharyngeal cancer, dropping from 210 to 155 pounds, but has regained weight to 215 pounds. He avoids sugar, ice cream, and white bread, focusing on vegetables and protein-rich foods like Ensure due to difficulty eating solid foods after losing his teeth.  HTN - amlodipine 10 mg daily and states blood pressure is typically "high".  Neuropathy- disrupts his sleep, requiring gabapentin and ibuprofen for relief. He takes amitriptyline 75 mg at night for sleep due to neuropathy. States neuropathy is from both cancer and DMII.  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 The treatment resulted in neuropathy, for which he has received nerve blocks and uses pain patches (Dr. Cherylann Ratel). 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  He lives alone in an apartment complex and has dealt with bed bug infestations, which have been treated multiple times. - current new infestation.        06/25/2023   11:45 AM 01/29/2023   12:19 PM 01/24/2023    1:37 PM  Depression screen PHQ 2/9  Decreased Interest 0 2 2  Down,  Depressed, Hopeless 0 2 3  PHQ - 2 Score 0 4 5  Altered sleeping 2 3 3   Tired, decreased energy 0 1 1  Change in appetite 0 0 0  Feeling bad or failure about yourself  0 0 0  Trouble concentrating 0 1 1  Moving slowly or fidgety/restless 0 0 1  Suicidal thoughts 0 0 0  PHQ-9 Score 2 9 11   Difficult doing work/chores Somewhat difficult  Very difficult       06/25/2023   11:45 AM 01/24/2023    1:38 PM 08/22/2022   10:14 AM  GAD 7 : Generalized Anxiety Score  Nervous, Anxious, on Edge 1 1 2   Control/stop worrying 0 1 0  Worry too much - different things 0 0 0  Trouble relaxing 0 1 1  Restless 0 1 0  Easily annoyed or irritable 0 1 2  Afraid - awful might happen 0 2 3  Total GAD 7 Score 1 7 8   Anxiety Difficulty Somewhat difficult Somewhat difficult Not difficult at all     Review of Systems  All other systems reviewed and are negative.   Negative unless indicated in HPI   Objective:     BP (!) 186/78 (BP Location: Left Arm, Patient Position: Sitting, Cuff Size: Normal)   Pulse 71   Ht 5\' 9"  (1.753 m)   Wt 215 lb 11.2 oz (97.8 kg)   SpO2 98%   BMI 31.85 kg/m    Physical Exam Constitutional:      General: He is not  in acute distress.    Appearance: Normal appearance. He is overweight. He is not ill-appearing, toxic-appearing or diaphoretic.     Comments: Clothing unkempt   HENT:     Head: Normocephalic.     Nose: Nose normal.     Mouth/Throat:     Mouth: Mucous membranes are moist.     Dentition: Abnormal dentition.     Comments: No teeth present Eyes:     General:        Right eye: No discharge.        Left eye: No discharge.     Extraocular Movements: Extraocular movements intact.     Conjunctiva/sclera: Conjunctivae normal.     Pupils: Pupils are equal, round, and reactive to light.  Neck:     Vascular: No carotid bruit.  Cardiovascular:     Rate and Rhythm: Normal rate and regular rhythm.     Pulses:          Dorsalis pedis pulses are 2+ on the  right side and 2+ on the left side.       Posterior tibial pulses are 2+ on the right side and 2+ on the left side.     Heart sounds: No murmur heard.    No friction rub. No gallop.  Pulmonary:     Effort: Pulmonary effort is normal. No respiratory distress.     Breath sounds: Normal breath sounds. No stridor. No wheezing, rhonchi or rales.  Chest:     Chest wall: No tenderness.  Musculoskeletal:     Right lower leg: No edema.     Left lower leg: No edema.     Right foot: Normal range of motion. No deformity, bunion, Charcot foot or foot drop.     Left foot: Normal range of motion. No deformity, bunion, Charcot foot or foot drop.  Feet:     Right foot:     Protective Sensation: 10 sites tested.  10 sites sensed.     Skin integrity: Callus and dry skin present. No ulcer, skin breakdown or erythema.     Toenail Condition: Right toenails are normal.     Left foot:     Protective Sensation: 10 sites tested.  10 sites sensed.     Skin integrity: Callus and dry skin present. No ulcer, skin breakdown or erythema.     Toenail Condition: Left toenails are normal.  Lymphadenopathy:     Cervical: No cervical adenopathy.  Skin:    General: Skin is warm and dry.     Capillary Refill: Capillary refill takes less than 2 seconds.  Neurological:     General: No focal deficit present.     Mental Status: He is alert and oriented to person, place, and time. Mental status is at baseline.     Cranial Nerves: Cranial nerves 2-12 are intact. No cranial nerve deficit.     Sensory: No sensory deficit.     Motor: No weakness.     Coordination: Coordination normal.     Gait: Gait normal.      No results found for any visits on 06/25/23.    The 10-year ASCVD risk score (Arnett DK, et al., 2019) is: 54.1%    Assessment & Plan:  Hypertension associated with diabetes (HCC) Assessment & Plan: Blood pressure elevated at 172/82  and 186/78 despite being on Amlodipine 10mg  daily. -Start Lisinopril 10  mg daily given hx of DMII -Check blood pressure at home 2-3 times a week with upper arm cuff -  low sodium diet -Follow up in 4 weeks to assess blood pressure control.  Orders: -     Comprehensive metabolic panel -     Lisinopril; Take 1 tablet (10 mg total) by mouth daily.  Dispense: 90 tablet; Refill: 1  Type 2 diabetes mellitus without complication, without long-term current use of insulin (HCC) Assessment & Plan: Last A1C was 6.3, indicating good control.  Recent weight gain noted. -Check labs today including A1C. -Encourage continued dietary modifications and weight management. Smaller meals with increase protein, vegetables, and fruits Decreased processed foods and saturated fats Referral for podiatry and Ophthalmology today Foot exam done On statin Starting ace for BP controll  Orders: -     Hemoglobin A1c -     Ambulatory referral to Podiatry -     Ambulatory referral to Ophthalmology  Hyperlipidemia associated with type 2 diabetes mellitus (HCC) Assessment & Plan: Chronic On Crestor 40mg  daily Will check LP today Decrease saturated fats increase purposeful movement as tolerated  Orders: -     Lipid panel  Diabetic polyneuropathy associated with type 2 diabetes mellitus (HCC) Assessment & Plan: Complicated by hx of neuropathy d/t  nasopharyngeal cancer hx Pt sees Dr. Cherylann Ratel for pain mgmt Continue gabapentin for neuropathy relief  Continue Elavil 75 mg hs for neuropathy pain during sleep and mood mgmt  Orders: -     Amitriptyline HCl; Take 1 tablet (75 mg total) by mouth at bedtime.  Dispense: 30 tablet; Refill: 5  Screening for deficiency anemia -     CBC  History of calculus of gallbladder Assessment & Plan: Gallbladder removal in December 2024 Pt healed, no concerning symptoms today Was seen by [facility] No current concerns   Infestation by bed bug Assessment & Plan: Acute on chronic, and has a follow up appt with exterminator  tomorrow Fourth infestation per chart review Building manager aware and taking care of it Requested if pt needs more resources - pt declines Future referral for SW if continues       Return in about 4 weeks (around 07/23/2023) for BP check.   I, Sallee Provencal, FNP, have reviewed all documentation for this visit. The documentation on 06/25/23 for the exam, diagnosis, procedures, and orders are all accurate and complete.   Sallee Provencal, FNP

## 2023-06-26 ENCOUNTER — Encounter: Payer: Self-pay | Admitting: Family Medicine

## 2023-06-26 LAB — LIPID PANEL
Chol/HDL Ratio: 2.9 {ratio} (ref 0.0–5.0)
Cholesterol, Total: 164 mg/dL (ref 100–199)
HDL: 56 mg/dL (ref >39)
LDL Chol Calc (NIH): 68 mg/dL (ref 0–99)
Triglycerides: 248 mg/dL — ABNORMAL HIGH (ref 0–149)
VLDL Cholesterol Cal: 40 mg/dL (ref 5–40)

## 2023-06-26 LAB — COMPREHENSIVE METABOLIC PANEL
ALT: 11 [IU]/L (ref 0–44)
AST: 25 [IU]/L (ref 0–40)
Albumin: 4.5 g/dL (ref 3.9–4.9)
Alkaline Phosphatase: 79 [IU]/L (ref 44–121)
BUN/Creatinine Ratio: 13 (ref 10–24)
BUN: 16 mg/dL (ref 8–27)
Bilirubin Total: 0.6 mg/dL (ref 0.0–1.2)
CO2: 25 mmol/L (ref 20–29)
Calcium: 8.7 mg/dL (ref 8.6–10.2)
Chloride: 98 mmol/L (ref 96–106)
Creatinine, Ser: 1.2 mg/dL (ref 0.76–1.27)
Globulin, Total: 2.6 g/dL (ref 1.5–4.5)
Glucose: 93 mg/dL (ref 70–99)
Potassium: 3.7 mmol/L (ref 3.5–5.2)
Sodium: 140 mmol/L (ref 134–144)
Total Protein: 7.1 g/dL (ref 6.0–8.5)
eGFR: 65 mL/min/{1.73_m2} (ref >59)

## 2023-06-26 LAB — CBC
Hematocrit: 42.8 % (ref 37.5–51.0)
Hemoglobin: 14.3 g/dL (ref 13.0–17.7)
MCH: 32.5 pg (ref 26.6–33.0)
MCHC: 33.4 g/dL (ref 31.5–35.7)
MCV: 97 fL (ref 79–97)
Platelets: 155 10*3/uL (ref 150–450)
RBC: 4.4 x10E6/uL (ref 4.14–5.80)
RDW: 15.8 % — ABNORMAL HIGH (ref 11.6–15.4)
WBC: 6.8 10*3/uL (ref 3.4–10.8)

## 2023-06-26 LAB — HEMOGLOBIN A1C
Est. average glucose Bld gHb Est-mCnc: 128 mg/dL
Hgb A1c MFr Bld: 6.1 % — ABNORMAL HIGH (ref 4.8–5.6)

## 2023-06-30 ENCOUNTER — Encounter: Payer: Self-pay | Admitting: Oncology

## 2023-07-02 ENCOUNTER — Encounter: Payer: Self-pay | Admitting: Oncology

## 2023-07-09 ENCOUNTER — Inpatient Hospital Stay
Admission: EM | Admit: 2023-07-09 | Discharge: 2023-07-22 | DRG: 674 | Disposition: A | Discharge status: Home Health | Payer: Medicare HMO | Attending: Internal Medicine | Admitting: Internal Medicine

## 2023-07-09 ENCOUNTER — Ambulatory Visit: Payer: Medicare HMO | Admitting: Podiatry

## 2023-07-09 ENCOUNTER — Other Ambulatory Visit: Payer: Self-pay

## 2023-07-09 ENCOUNTER — Emergency Department: Payer: Medicare HMO

## 2023-07-09 DIAGNOSIS — Z66 Do not resuscitate: Secondary | ICD-10-CM | POA: Diagnosis not present

## 2023-07-09 DIAGNOSIS — N179 Acute kidney failure, unspecified: Secondary | ICD-10-CM | POA: Diagnosis not present

## 2023-07-09 DIAGNOSIS — F0390 Unspecified dementia without behavioral disturbance: Secondary | ICD-10-CM | POA: Diagnosis not present

## 2023-07-09 DIAGNOSIS — R296 Repeated falls: Secondary | ICD-10-CM | POA: Diagnosis not present

## 2023-07-09 DIAGNOSIS — F1010 Alcohol abuse, uncomplicated: Secondary | ICD-10-CM | POA: Diagnosis present

## 2023-07-09 DIAGNOSIS — E538 Deficiency of other specified B group vitamins: Secondary | ICD-10-CM | POA: Diagnosis not present

## 2023-07-09 DIAGNOSIS — I120 Hypertensive chronic kidney disease with stage 5 chronic kidney disease or end stage renal disease: Secondary | ICD-10-CM | POA: Diagnosis present

## 2023-07-09 DIAGNOSIS — G40909 Epilepsy, unspecified, not intractable, without status epilepticus: Secondary | ICD-10-CM | POA: Diagnosis not present

## 2023-07-09 DIAGNOSIS — R918 Other nonspecific abnormal finding of lung field: Secondary | ICD-10-CM | POA: Diagnosis not present

## 2023-07-09 DIAGNOSIS — Z882 Allergy status to sulfonamides status: Secondary | ICD-10-CM

## 2023-07-09 DIAGNOSIS — Z8261 Family history of arthritis: Secondary | ICD-10-CM

## 2023-07-09 DIAGNOSIS — Z604 Social exclusion and rejection: Secondary | ICD-10-CM | POA: Diagnosis present

## 2023-07-09 DIAGNOSIS — D696 Thrombocytopenia, unspecified: Secondary | ICD-10-CM | POA: Diagnosis not present

## 2023-07-09 DIAGNOSIS — E1142 Type 2 diabetes mellitus with diabetic polyneuropathy: Secondary | ICD-10-CM | POA: Diagnosis present

## 2023-07-09 DIAGNOSIS — E119 Type 2 diabetes mellitus without complications: Secondary | ICD-10-CM

## 2023-07-09 DIAGNOSIS — J449 Chronic obstructive pulmonary disease, unspecified: Secondary | ICD-10-CM | POA: Diagnosis not present

## 2023-07-09 DIAGNOSIS — E1129 Type 2 diabetes mellitus with other diabetic kidney complication: Secondary | ICD-10-CM | POA: Diagnosis not present

## 2023-07-09 DIAGNOSIS — Z79899 Other long term (current) drug therapy: Secondary | ICD-10-CM | POA: Diagnosis not present

## 2023-07-09 DIAGNOSIS — Z87891 Personal history of nicotine dependence: Secondary | ICD-10-CM

## 2023-07-09 DIAGNOSIS — E871 Hypo-osmolality and hyponatremia: Secondary | ICD-10-CM | POA: Diagnosis present

## 2023-07-09 DIAGNOSIS — Z8249 Family history of ischemic heart disease and other diseases of the circulatory system: Secondary | ICD-10-CM | POA: Diagnosis not present

## 2023-07-09 DIAGNOSIS — R509 Fever, unspecified: Secondary | ICD-10-CM | POA: Diagnosis not present

## 2023-07-09 DIAGNOSIS — Z9181 History of falling: Secondary | ICD-10-CM

## 2023-07-09 DIAGNOSIS — D519 Vitamin B12 deficiency anemia, unspecified: Secondary | ICD-10-CM | POA: Diagnosis present

## 2023-07-09 DIAGNOSIS — E8729 Other acidosis: Secondary | ICD-10-CM | POA: Diagnosis not present

## 2023-07-09 DIAGNOSIS — Z8619 Personal history of other infectious and parasitic diseases: Secondary | ICD-10-CM | POA: Diagnosis present

## 2023-07-09 DIAGNOSIS — J9611 Chronic respiratory failure with hypoxia: Secondary | ICD-10-CM | POA: Diagnosis not present

## 2023-07-09 DIAGNOSIS — N189 Chronic kidney disease, unspecified: Secondary | ICD-10-CM | POA: Diagnosis not present

## 2023-07-09 DIAGNOSIS — Z1152 Encounter for screening for COVID-19: Secondary | ICD-10-CM | POA: Diagnosis not present

## 2023-07-09 DIAGNOSIS — E872 Acidosis, unspecified: Secondary | ICD-10-CM | POA: Diagnosis not present

## 2023-07-09 DIAGNOSIS — E66811 Obesity, class 1: Secondary | ICD-10-CM | POA: Diagnosis not present

## 2023-07-09 DIAGNOSIS — E876 Hypokalemia: Secondary | ICD-10-CM | POA: Diagnosis present

## 2023-07-09 DIAGNOSIS — D649 Anemia, unspecified: Secondary | ICD-10-CM

## 2023-07-09 DIAGNOSIS — E860 Dehydration: Secondary | ICD-10-CM | POA: Diagnosis not present

## 2023-07-09 DIAGNOSIS — W19XXXA Unspecified fall, initial encounter: Secondary | ICD-10-CM | POA: Diagnosis not present

## 2023-07-09 DIAGNOSIS — R531 Weakness: Secondary | ICD-10-CM | POA: Diagnosis not present

## 2023-07-09 DIAGNOSIS — Z7982 Long term (current) use of aspirin: Secondary | ICD-10-CM

## 2023-07-09 DIAGNOSIS — I10 Essential (primary) hypertension: Secondary | ICD-10-CM | POA: Diagnosis present

## 2023-07-09 DIAGNOSIS — S0990XA Unspecified injury of head, initial encounter: Secondary | ICD-10-CM | POA: Diagnosis not present

## 2023-07-09 DIAGNOSIS — E1122 Type 2 diabetes mellitus with diabetic chronic kidney disease: Secondary | ICD-10-CM | POA: Diagnosis not present

## 2023-07-09 DIAGNOSIS — R809 Proteinuria, unspecified: Secondary | ICD-10-CM | POA: Diagnosis not present

## 2023-07-09 DIAGNOSIS — Z043 Encounter for examination and observation following other accident: Secondary | ICD-10-CM | POA: Diagnosis not present

## 2023-07-09 DIAGNOSIS — M47812 Spondylosis without myelopathy or radiculopathy, cervical region: Secondary | ICD-10-CM | POA: Diagnosis not present

## 2023-07-09 DIAGNOSIS — Z841 Family history of disorders of kidney and ureter: Secondary | ICD-10-CM

## 2023-07-09 DIAGNOSIS — C119 Malignant neoplasm of nasopharynx, unspecified: Secondary | ICD-10-CM | POA: Diagnosis present

## 2023-07-09 DIAGNOSIS — D631 Anemia in chronic kidney disease: Secondary | ICD-10-CM | POA: Diagnosis not present

## 2023-07-09 DIAGNOSIS — F1090 Alcohol use, unspecified, uncomplicated: Secondary | ICD-10-CM | POA: Insufficient documentation

## 2023-07-09 DIAGNOSIS — N186 End stage renal disease: Secondary | ICD-10-CM | POA: Diagnosis not present

## 2023-07-09 DIAGNOSIS — Z85818 Personal history of malignant neoplasm of other sites of lip, oral cavity, and pharynx: Secondary | ICD-10-CM

## 2023-07-09 DIAGNOSIS — Z992 Dependence on renal dialysis: Secondary | ICD-10-CM | POA: Diagnosis not present

## 2023-07-09 DIAGNOSIS — G90523 Complex regional pain syndrome I of lower limb, bilateral: Secondary | ICD-10-CM | POA: Diagnosis present

## 2023-07-09 DIAGNOSIS — Z888 Allergy status to other drugs, medicaments and biological substances status: Secondary | ICD-10-CM

## 2023-07-09 DIAGNOSIS — N170 Acute kidney failure with tubular necrosis: Secondary | ICD-10-CM | POA: Diagnosis not present

## 2023-07-09 DIAGNOSIS — Z9981 Dependence on supplemental oxygen: Secondary | ICD-10-CM

## 2023-07-09 DIAGNOSIS — G5773 Causalgia of bilateral lower limbs: Secondary | ICD-10-CM | POA: Diagnosis present

## 2023-07-09 DIAGNOSIS — F17200 Nicotine dependence, unspecified, uncomplicated: Secondary | ICD-10-CM | POA: Diagnosis present

## 2023-07-09 DIAGNOSIS — Z818 Family history of other mental and behavioral disorders: Secondary | ICD-10-CM

## 2023-07-09 LAB — CBC
HCT: 36.9 % — ABNORMAL LOW (ref 39.0–52.0)
Hemoglobin: 12.5 g/dL — ABNORMAL LOW (ref 13.0–17.0)
MCH: 32.6 pg (ref 26.0–34.0)
MCHC: 33.9 g/dL (ref 30.0–36.0)
MCV: 96.3 fL (ref 80.0–100.0)
Platelets: 76 10*3/uL — ABNORMAL LOW (ref 150–400)
RBC: 3.83 MIL/uL — ABNORMAL LOW (ref 4.22–5.81)
RDW: 16 % — ABNORMAL HIGH (ref 11.5–15.5)
WBC: 6.8 10*3/uL (ref 4.0–10.5)
nRBC: 0 % (ref 0.0–0.2)

## 2023-07-09 LAB — HEPATIC FUNCTION PANEL
ALT: 20 U/L (ref 0–44)
AST: 48 U/L — ABNORMAL HIGH (ref 15–41)
Albumin: 3.8 g/dL (ref 3.5–5.0)
Alkaline Phosphatase: 64 U/L (ref 38–126)
Bilirubin, Direct: 0.3 mg/dL — ABNORMAL HIGH (ref 0.0–0.2)
Indirect Bilirubin: 0.8 mg/dL (ref 0.3–0.9)
Total Bilirubin: 1.1 mg/dL (ref 0.0–1.2)
Total Protein: 7.2 g/dL (ref 6.5–8.1)

## 2023-07-09 LAB — BASIC METABOLIC PANEL
Anion gap: 18 — ABNORMAL HIGH (ref 5–15)
BUN: 38 mg/dL — ABNORMAL HIGH (ref 8–23)
CO2: 18 mmol/L — ABNORMAL LOW (ref 22–32)
Calcium: 8.1 mg/dL — ABNORMAL LOW (ref 8.9–10.3)
Chloride: 95 mmol/L — ABNORMAL LOW (ref 98–111)
Creatinine, Ser: 8.29 mg/dL — ABNORMAL HIGH (ref 0.61–1.24)
GFR, Estimated: 6 mL/min — ABNORMAL LOW (ref >60)
Glucose, Bld: 116 mg/dL — ABNORMAL HIGH (ref 70–99)
Potassium: 3.7 mmol/L (ref 3.5–5.1)
Sodium: 131 mmol/L — ABNORMAL LOW (ref 135–145)

## 2023-07-09 LAB — TROPONIN I (HIGH SENSITIVITY): Troponin I (High Sensitivity): 26 ng/L — ABNORMAL HIGH (ref <18)

## 2023-07-09 MED ORDER — SODIUM CHLORIDE 0.9 % IV SOLN: INTRAVENOUS | Status: DC

## 2023-07-09 MED ORDER — SODIUM CHLORIDE 0.9 % IV BOLUS (SEPSIS)
1000.0000 mL | Freq: Once | INTRAVENOUS | Status: AC
  Administered 2023-07-09: 1000 mL via INTRAVENOUS

## 2023-07-09 MED ORDER — FENTANYL CITRATE PF 50 MCG/ML IJ SOSY
50.0000 ug | PREFILLED_SYRINGE | Freq: Once | INTRAMUSCULAR | Status: AC
  Administered 2023-07-09: 50 ug via INTRAVENOUS
  Filled 2023-07-09: qty 1

## 2023-07-09 MED ORDER — ONDANSETRON HCL 4 MG/2ML IJ SOLN
4.0000 mg | Freq: Once | INTRAMUSCULAR | Status: AC
  Administered 2023-07-09: 4 mg via INTRAVENOUS
  Filled 2023-07-09: qty 2

## 2023-07-09 NOTE — ED Triage Notes (Addendum)
 Pt to ED via EMS from home, pt reports frequent falls at home over the past 5 days, pt denies feeling weak denies cough congestion fevers. Pt is not on blood thinners. Pt states when he fell on Friday he hit the back of his head. Pt c/o generalized soreness to body from frequent falls. Pt denies dysuria

## 2023-07-09 NOTE — ED Triage Notes (Signed)
 EMS brings pt in from home; st hx neuropathy and has been having frequent falls; "sore all over from falling"

## 2023-07-09 NOTE — ED Provider Notes (Signed)
 [facility] Provider Note    Event Date/Time   First MD Initiated Contact with Patient 07/09/23 2259     (approximate)   History   Fall   HPI  [name] is a 73 y.o. male with history of alcohol abuse, COPD, hepatitis C, hypertension who presents to the emergency department with multiple falls.  States that he feels like his legs are just very weak and giving out on him.  He did hit his head but did not lose consciousness.  He is on daily aspirin but no anticoagulants.  He does complain of some posterior neck pain.  He states that he was just started on lisinopril on Monday.  No other new medications.  No fevers, cough, vomiting, diarrhea, chest pain or shortness of breath.  States he has been eating and drinking well.   History provided by patient.    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    COPD (chronic obstructive pulmonary disease) (HCC)    Depression 08/19/2017   Hepatitis C    Hypertension    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HCC)    Seizures (HCC) 2010   Sleep apnea    Syncope    questionable vasovagal    Past Surgical History:  Procedure Laterality Date   COLONOSCOPY WITH PROPOFOL N/A 12/16/2016   Procedure: COLONOSCOPY WITH PROPOFOL;  Surgeon: Christena Deem, MD;  Location: [facility] ENDOSCOPY;  Service: Endoscopy;  Laterality: N/A;   COLONOSCOPY WITH PROPOFOL N/A 03/14/2017   Procedure: COLONOSCOPY WITH PROPOFOL;  Surgeon: Christena Deem, MD;  Location: [facility] ENDOSCOPY;  Service: Endoscopy;  Laterality: N/A;   COLONOSCOPY WITH PROPOFOL     FOOT SURGERY Right 03/12/2002   FOOT SURGERY     HEMORRHOID SURGERY     LITHOTRIPSY     MYRINGOTOMY     MYRINGOTOMY WITH TUBE PLACEMENT Left 04/28/2019   Procedure: MYRINGOTOMY WITH TUBE PLACEMENT;  Surgeon: Bud Face, MD;   Location: [facility] SURGERY CNTR;  Service: ENT;  Laterality: Left;   NASOPHARYNGOSCOPY N/A 04/28/2019   Procedure: NASOPHARYNGOSCOPY WITH BIOPSY;  Surgeon: Bud Face, MD;  Location: [facility] SURGERY CNTR;  Service: ENT;  Laterality: N/A;   NASOPHARYNGOSCOPY     Port a cath Placement     PORTA CATH INSERTION N/A 05/20/2019   Procedure: PORTA CATH INSERTION;  Surgeon: Annice Needy, MD;  Location: [facility] INVASIVE CV LAB;  Service: Cardiovascular;  Laterality: N/A;   PORTA CATH REMOVAL N/A 11/29/2020   Procedure: PORTA CATH REMOVAL;  Surgeon: Annice Needy, MD;  Location: [facility] INVASIVE CV LAB;  Service: Cardiovascular;  Laterality: N/A;   TONSILLECTOMY      MEDICATIONS:  Prior to Admission medications   Medication Sig Start Date End Date Taking? Authorizing Provider  acetaminophen (TYLENOL) 500 MG tablet Take 2 tablets (1,000 mg total) by mouth every 6 (six) hours as needed for mild pain (pain score 1-3). 04/17/23   Henrene Dodge, MD  amitriptyline (ELAVIL) 75 MG tablet Take 1 tablet (75 mg total) by mouth at bedtime. 06/25/23   Sallee Provencal, FNP  amLODipine (NORVASC) 10 MG tablet Take 1 tablet (10 mg total) by mouth daily. 01/24/23   [name], FNP  antiseptic oral rinse (BIOTENE) LIQD 15 mLs by Mouth Rinse route as needed for dry mouth.  [provider]  aspirin EC 81 MG tablet Take 81 mg by mouth daily. Swallow whole.    [provider]  Blood Glucose Monitoring Suppl DEVI 1 each by Does not apply route in the morning, at noon, and at bedtime. May substitute to any manufacturer covered by patient's insurance. Pt's current glucometer is broken. 10/28/22   Alfredia Ferguson, PA-C  gabapentin (NEURONTIN) 300 MG capsule Take 1 capsule (300 mg total) by mouth 2 (two) times daily for 15 days. 04/17/23 05/02/23  Henrene Dodge, MD  ibuprofen (ADVIL) 600 MG tablet Take 1 tablet (600 mg total) by mouth every 8 (eight) hours as needed for moderate pain (pain score 4-6). 04/17/23    Henrene Dodge, MD  lisinopril (ZESTRIL) 10 MG tablet Take 1 tablet (10 mg total) by mouth daily. 06/25/23   Sallee Provencal, FNP  nystatin (MYCOSTATIN) 100000 UNIT/ML suspension Take 5 mLs by mouth daily as needed (thrush). 02/06/23   [provider]  omeprazole (PRILOSEC) 40 MG capsule TAKE 1 CAPSULE BY MOUTH ONCE DAILY 10/23/22   Ok Edwards, Lillia Abed, PA-C  rosuvastatin (CRESTOR) 40 MG tablet Take 1 tablet (40 mg total) by mouth daily. 01/24/23   [name], FNP    Physical Exam   Triage Vital Signs: ED Triage Vitals  Encounter Vitals Group     BP 07/09/23 1915 104/84     Systolic BP Percentile --      Diastolic BP Percentile --      Pulse Rate 07/09/23 1913 88     Resp 07/09/23 1913 16     Temp 07/09/23 1913 98.8 F (37.1 C)     Temp Source 07/09/23 1913 Oral     SpO2 07/09/23 1913 97 %     Weight 07/09/23 1912 200 lb (90.7 kg)     Height 07/09/23 1912 5\' 9"  (1.753 m)     Head Circumference --      Peak Flow --      Pain Score 07/09/23 1912 7     Pain Loc --      Pain Education --      Exclude from Growth Chart --     Most recent vital signs: Vitals:   07/09/23 1913 07/09/23 1915  BP:  104/84  Pulse: 88   Resp: 16   Temp: 98.8 F (37.1 C)   SpO2: 97%      CONSTITUTIONAL: Alert, responds appropriately to questions.  Elderly, no distress HEAD: Normocephalic; atraumatic EYES: Conjunctivae clear, PERRL, EOMI ENT: normal nose; no rhinorrhea; moist mucous membranes; pharynx without lesions noted; no dental injury; no septal hematoma, no epistaxis; no facial deformity or bony tenderness NECK: Supple, no midline spinal tenderness, step-off or deformity; trachea midline, tender over the cervical paraspinal muscles CARD: RRR; S1 and S2 appreciated; no murmurs, no clicks, no rubs, no gallops RESP: Normal chest excursion without splinting or tachypnea; breath sounds clear and equal bilaterally; no wheezes, no rhonchi, no rales; no hypoxia or respiratory distress CHEST:   chest wall stable, no crepitus or ecchymosis or deformity, nontender to palpation; no flail chest ABD/GI: Non-distended; soft, non-tender, no rebound, no guarding; no ecchymosis or other lesions noted PELVIS:  stable, nontender to palpation BACK:  The back appears normal; no midline spinal tenderness, step-off or deformity EXT: Normal ROM in all joints; no edema; normal capillary refill; no cyanosis, no bony tenderness or bony deformity of patient's extremities, no joint effusions, compartments are soft, extremities are warm and well-perfused, no ecchymosis SKIN: Normal color  for age and race; warm NEURO: No facial asymmetry, normal speech, moving all extremities equally  ED Results / Procedures / Treatments   LABS: (all labs ordered are listed, but only abnormal results are displayed) Labs Reviewed  BASIC METABOLIC PANEL - Abnormal; Notable for the following components:      Result Value   Sodium 131 (*)    Chloride 95 (*)    CO2 18 (*)    Glucose, Bld 116 (*)    BUN 38 (*)    Creatinine, Ser 8.29 (*)    Calcium 8.1 (*)    GFR, Estimated 6 (*)    Anion gap 18 (*)    All other components within normal limits  CBC - Abnormal; Notable for the following components:   RBC 3.83 (*)    Hemoglobin 12.5 (*)    HCT 36.9 (*)    RDW 16.0 (*)    Platelets 76 (*)    All other components within normal limits  TROPONIN I (HIGH SENSITIVITY) - Abnormal; Notable for the following components:   Troponin I (High Sensitivity) 26 (*)    All other components within normal limits  URINALYSIS, ROUTINE W REFLEX MICROSCOPIC  PROTIME-INR  ETHANOL  HEPATIC FUNCTION PANEL     EKG:  EKG Interpretation Date/Time:  Wednesday July 09 2023 19:19:19 EST Ventricular Rate:  84 PR Interval:  138 QRS Duration:  94 QT Interval:  354 QTC Calculation: 418 R Axis:   -55  Text Interpretation: Normal sinus rhythm with sinus arrhythmia Left axis deviation Abnormal ECG When compared with ECG of 15-Feb-2023  15:45, No significant change was found Confirmed by Rochele Raring [phone]) on 07/09/2023 11:21:50 PM          RADIOLOGY: My personal review and interpretation of imaging: CT head and cervical spine show no traumatic injury.  Chest x-ray clear.  I have personally reviewed all radiology reports. CT Cervical Spine Wo Contrast Result Date: 07/09/2023 CLINICAL DATA:  [name], hit head EXAM: CT CERVICAL SPINE WITHOUT CONTRAST TECHNIQUE: Multidetector CT imaging of the cervical spine was performed without intravenous contrast. Multiplanar CT image reconstructions were also generated. RADIATION DOSE REDUCTION: This exam was performed according to the departmental dose-optimization program which includes automated exposure control, adjustment of the mA and/or kV according to patient size and/or use of iterative reconstruction technique. COMPARISON:  11/03/2019 FINDINGS: Alignment: Straightening of the cervical spine is likely positional. Otherwise alignment is anatomic. Skull base and vertebrae: No acute fracture. No primary bone lesion or focal pathologic process. Soft tissues and spinal canal: No prevertebral fluid or swelling. No visible canal hematoma. Disc levels: There is progressive multilevel spondylosis most pronounced at the C5-6 and C6-7 levels. Mild diffuse facet hypertrophy. Upper chest: Airway is patent.  Lung apices are clear. Other: Reconstructed images demonstrate no additional findings. IMPRESSION: 1. No acute cervical spine fracture. 2. Progressive multilevel cervical spondylosis, greatest at C5-6 and C6-7. Electronically Signed   By: Sharlet Salina M.D.   On: 07/09/2023 20:08   CT HEAD WO CONTRAST ( ) Result Date: 07/09/2023 CLINICAL DATA:  Multiple falls, hit head EXAM: CT HEAD WITHOUT CONTRAST TECHNIQUE: Contiguous axial images were obtained from the base of the skull through the vertex without intravenous contrast. RADIATION DOSE REDUCTION: This exam was performed according to the  departmental dose-optimization program which includes automated exposure control, adjustment of the mA and/or kV according to patient size and/or use of iterative reconstruction technique. COMPARISON:  09/25/2022 FINDINGS: Brain: No acute infarct or hemorrhage. Lateral ventricles  and midline structures are unremarkable. No acute extra-axial fluid collections. No mass effect. Vascular: No hyperdense vessel or unexpected calcification. Skull: Normal. Negative for fracture or focal lesion. Sinuses/Orbits: No acute finding. Other: None. IMPRESSION: 1. No acute intracranial process. Electronically Signed   By: Sharlet Salina M.D.   On: 07/09/2023 20:06   DG Chest 2 View Result Date: 07/09/2023 CLINICAL DATA:  Multiple falls EXAM: CHEST - 2 VIEW COMPARISON:  02/15/2023 FINDINGS: Frontal and lateral views of the chest demonstrate an unremarkable cardiac silhouette. No acute airspace disease, effusion, or pneumothorax. No acute bony abnormalities. IMPRESSION: 1. No acute intrathoracic process. Electronically Signed   By: Sharlet Salina M.D.   On: 07/09/2023 20:04     PROCEDURES:  Critical Care performed: Yes, see critical care procedure note(s)   CRITICAL CARE Performed by: Baxter Hire Aidaly Cordner   Total critical care time: 40 minutes  Critical care time was exclusive of separately billable procedures and treating other patients.  Critical care was necessary to treat or prevent imminent or life-threatening deterioration.  Critical care was time spent personally by me on the following activities: development of treatment plan with patient and/or surrogate as well as nursing, discussions with consultants, evaluation of patient's response to treatment, examination of patient, obtaining history from patient or surrogate, ordering and performing treatments and interventions, ordering and review of laboratory studies, ordering and review of radiographic studies, pulse oximetry and re-evaluation of patient's  condition.   Procedures    IMPRESSION / MDM / ASSESSMENT AND PLAN / ED COURSE  I reviewed the triage vital signs and the nursing notes.  Patient here with frequent falls, weakness.    DIFFERENTIAL DIAGNOSIS (includes but not limited to):   Anemia, electrolyte derangement, thyroid dysfunction, UTI, intracranial hemorrhage, stroke, dehydration, hypotension secondary to new medication, ACS, arrhythmia  Patient's presentation is most consistent with acute presentation with potential threat to life or bodily function.  PLAN: Workup initiated from triage.  Patient has a new renal failure with creatinine of 8.29.  Creatinine was 1.2 on 06/26/2023.  Has a metabolic acidosis, elevated anion gap likely secondary to uremia.  Normal potassium.  Normal hemoglobin.  Platelets 76,000 which may be secondary to previous history of alcohol use.  Will add on LFTs, obtain INR.  Will start West hydration.  Patient does report recently starting lisinopril 10 mg and also states that since he has fallen he has been taking ibuprofen 600 mg at least a couple times a day.  Denies any other NSAID use.  Reports he has been eating and drinking well without vomiting or diarrhea.  No prior history of kidney disease.  He denies any abdominal pain, flank pain.  Reports he is still making urine.  Will discuss with hospitalist for admission.   MEDICATIONS GIVEN IN ED: Medications  sodium chloride 0.9 % bolus 1,000 mL (has no administration in time range)  0.9 %  sodium chloride infusion (has no administration in time range)  fentaNYL (SUBLIMAZE) injection 50 mcg (has no administration in time range)  ondansetron (ZOFRAN) injection 4 mg (has no administration in time range)     ED COURSE:  Consulted and discussed patient's case with hospitalist, Dr. Para March.  I have recommended admission and consulting physician agrees and will place admission orders.  Patient (and family if present) agree with this plan.   I reviewed all  nursing notes, vitals, pertinent previous records.  All labs, EKGs, imaging ordered have been independently reviewed and interpreted by myself.  OUTSIDE RECORDS REVIEWED: Reviewed PCP note on 06/25/2023       FINAL CLINICAL IMPRESSION(S) / ED DIAGNOSES   Final diagnoses:  AKI (acute kidney injury) (HCC)  Increased anion gap metabolic acidosis  Frequent falls  Injury of head, initial encounter  Thrombocytopenia (HCC)     Rx / DC Orders   ED Discharge Orders     None        Note:  This document was prepared using Dragon voice recognition software and may include unintentional dictation errors.   Starlett Pehrson, Layla Maw, DO 07/09/23 2328

## 2023-07-10 ENCOUNTER — Inpatient Hospital Stay: Payer: Medicare HMO

## 2023-07-10 DIAGNOSIS — N179 Acute kidney failure, unspecified: Secondary | ICD-10-CM

## 2023-07-10 DIAGNOSIS — D631 Anemia in chronic kidney disease: Secondary | ICD-10-CM

## 2023-07-10 DIAGNOSIS — E8729 Other acidosis: Secondary | ICD-10-CM

## 2023-07-10 DIAGNOSIS — F1090 Alcohol use, unspecified, uncomplicated: Secondary | ICD-10-CM | POA: Insufficient documentation

## 2023-07-10 DIAGNOSIS — D649 Anemia, unspecified: Secondary | ICD-10-CM

## 2023-07-10 DIAGNOSIS — R296 Repeated falls: Secondary | ICD-10-CM

## 2023-07-10 LAB — BASIC METABOLIC PANEL
Anion gap: 16 — ABNORMAL HIGH (ref 5–15)
BUN: 40 mg/dL — ABNORMAL HIGH (ref 8–23)
CO2: 18 mmol/L — ABNORMAL LOW (ref 22–32)
Calcium: 7.6 mg/dL — ABNORMAL LOW (ref 8.9–10.3)
Chloride: 94 mmol/L — ABNORMAL LOW (ref 98–111)
Creatinine, Ser: 8.52 mg/dL — ABNORMAL HIGH (ref 0.61–1.24)
GFR, Estimated: 6 mL/min — ABNORMAL LOW (ref >60)
Glucose, Bld: 94 mg/dL (ref 70–99)
Potassium: 3.4 mmol/L — ABNORMAL LOW (ref 3.5–5.1)
Sodium: 128 mmol/L — ABNORMAL LOW (ref 135–145)

## 2023-07-10 LAB — CBC
HCT: 35.1 % — ABNORMAL LOW (ref 39.0–52.0)
Hemoglobin: 12 g/dL — ABNORMAL LOW (ref 13.0–17.0)
MCH: 32.6 pg (ref 26.0–34.0)
MCHC: 34.2 g/dL (ref 30.0–36.0)
MCV: 95.4 fL (ref 80.0–100.0)
Platelets: 68 10*3/uL — ABNORMAL LOW (ref 150–400)
RBC: 3.68 MIL/uL — ABNORMAL LOW (ref 4.22–5.81)
RDW: 16.1 % — ABNORMAL HIGH (ref 11.5–15.5)
WBC: 6 10*3/uL (ref 4.0–10.5)
nRBC: 0 % (ref 0.0–0.2)

## 2023-07-10 LAB — CBC WITH DIFFERENTIAL/PLATELET
Abs Immature Granulocytes: 0.02 10*3/uL (ref 0.00–0.07)
Basophils Absolute: 0 10*3/uL (ref 0.0–0.1)
Basophils Relative: 0 %
Eosinophils Absolute: 0.3 10*3/uL (ref 0.0–0.5)
Eosinophils Relative: 4 %
HCT: 36.7 % — ABNORMAL LOW (ref 39.0–52.0)
Hemoglobin: 12.4 g/dL — ABNORMAL LOW (ref 13.0–17.0)
Immature Granulocytes: 0 %
Lymphocytes Relative: 9 %
Lymphs Abs: 0.7 10*3/uL (ref 0.7–4.0)
MCH: 32 pg (ref 26.0–34.0)
MCHC: 33.8 g/dL (ref 30.0–36.0)
MCV: 94.6 fL (ref 80.0–100.0)
Monocytes Absolute: 0.4 10*3/uL (ref 0.1–1.0)
Monocytes Relative: 6 %
Neutro Abs: 5.5 10*3/uL (ref 1.7–7.7)
Neutrophils Relative %: 81 %
Platelets: 79 10*3/uL — ABNORMAL LOW (ref 150–400)
RBC: 3.88 MIL/uL — ABNORMAL LOW (ref 4.22–5.81)
RDW: 16 % — ABNORMAL HIGH (ref 11.5–15.5)
WBC: 6.9 10*3/uL (ref 4.0–10.5)
nRBC: 0 % (ref 0.0–0.2)

## 2023-07-10 LAB — FERRITIN: Ferritin: 205 ng/mL (ref 24–336)

## 2023-07-10 LAB — GLUCOSE, CAPILLARY
Glucose-Capillary: 122 mg/dL — ABNORMAL HIGH (ref 70–99)
Glucose-Capillary: 105 mg/dL — ABNORMAL HIGH (ref 70–99)
Glucose-Capillary: 128 mg/dL — ABNORMAL HIGH (ref 70–99)
Glucose-Capillary: 103 mg/dL — ABNORMAL HIGH (ref 70–99)
Glucose-Capillary: 86 mg/dL (ref 70–99)

## 2023-07-10 LAB — RETICULOCYTES
Immature Retic Fract: 6.8 % (ref 2.3–15.9)
RBC.: 3.68 MIL/uL — ABNORMAL LOW (ref 4.22–5.81)
Retic Count, Absolute: 33.1 10*3/uL (ref 19.0–186.0)
Retic Ct Pct: 0.9 % (ref 0.4–3.1)

## 2023-07-10 LAB — HEPATITIS PANEL, ACUTE
HCV Ab: REACTIVE — AB
Hep A IgM: NONREACTIVE
Hep B C IgM: NONREACTIVE
Hepatitis B Surface Ag: NONREACTIVE

## 2023-07-10 LAB — VITAMIN B12: Vitamin B-12: 380 pg/mL (ref 180–914)

## 2023-07-10 LAB — FOLATE: Folate: 8.6 ng/mL (ref >5.9)

## 2023-07-10 LAB — ETHANOL: Alcohol, Ethyl (B): <10 mg/dL (ref <10)

## 2023-07-10 LAB — TECHNOLOGIST SMEAR REVIEW: Plt Morphology: NORMAL

## 2023-07-10 LAB — PROTIME-INR
INR: 1.1 (ref 0.8–1.2)
Prothrombin Time: 14.2 s (ref 11.4–15.2)

## 2023-07-10 LAB — IRON AND TIBC
Iron: 56 ug/dL (ref 45–182)
Saturation Ratios: 17 % — ABNORMAL LOW (ref 17.9–39.5)
TIBC: 335 ug/dL (ref 250–450)
UIBC: 279 ug/dL

## 2023-07-10 MED ORDER — ADULT MULTIVITAMIN W/MINERALS CH
1.0000 | ORAL_TABLET | Freq: Every day | ORAL | Status: DC
  Administered 2023-07-10 – 2023-07-22 (×12): 1 via ORAL
  Filled 2023-07-10 (×13): qty 1

## 2023-07-10 MED ORDER — LORAZEPAM 1 MG PO TABS
1.0000 mg | ORAL_TABLET | ORAL | Status: AC | PRN
  Administered 2023-07-10 – 2023-07-13 (×2): 1 mg via ORAL
  Filled 2023-07-10: qty 1

## 2023-07-10 MED ORDER — ASPIRIN 81 MG PO TBEC
81.0000 mg | DELAYED_RELEASE_TABLET | Freq: Every day | ORAL | Status: DC
Start: 2023-07-10 — End: 2023-07-22
  Administered 2023-07-10 – 2023-07-22 (×12): 81 mg via ORAL
  Filled 2023-07-10 (×13): qty 1

## 2023-07-10 MED ORDER — NICOTINE 14 MG/24HR TD PT24
14.0000 mg | MEDICATED_PATCH | Freq: Every day | TRANSDERMAL | Status: DC
  Administered 2023-07-10 – 2023-07-20 (×10): 14 mg via TRANSDERMAL
  Filled 2023-07-10 (×12): qty 1

## 2023-07-10 MED ORDER — GABAPENTIN 100 MG PO CAPS
200.0000 mg | ORAL_CAPSULE | Freq: Every day | ORAL | Status: AC
  Administered 2023-07-13 – 2023-07-14 (×2): 200 mg via ORAL
  Filled 2023-07-10 (×2): qty 2

## 2023-07-10 MED ORDER — THIAMINE MONONITRATE 100 MG PO TABS
100.0000 mg | ORAL_TABLET | Freq: Every day | ORAL | Status: DC
  Administered 2023-07-10 – 2023-07-22 (×12): 100 mg via ORAL
  Filled 2023-07-10 (×13): qty 1

## 2023-07-10 MED ORDER — HYDROCODONE-ACETAMINOPHEN 5-325 MG PO TABS
1.0000 | ORAL_TABLET | ORAL | Status: DC | PRN
  Administered 2023-07-10: 1 via ORAL
  Administered 2023-07-13: 2 via ORAL
  Administered 2023-07-18: 1 via ORAL
  Administered 2023-07-18 – 2023-07-19 (×2): 2 via ORAL
  Filled 2023-07-10: qty 1
  Filled 2023-07-10 (×2): qty 2
  Filled 2023-07-10: qty 1
  Filled 2023-07-10: qty 2

## 2023-07-10 MED ORDER — ORAL CARE MOUTH RINSE: 15.0000 mL | OROMUCOSAL | Status: DC | PRN

## 2023-07-10 MED ORDER — ONDANSETRON HCL 4 MG/2ML IJ SOLN: 4.0000 mg | Freq: Four times a day (QID) | INTRAMUSCULAR | Status: DC | PRN

## 2023-07-10 MED ORDER — INSULIN ASPART 100 UNIT/ML IJ SOLN
0.0000 [IU] | Freq: Three times a day (TID) | INTRAMUSCULAR | Status: DC
  Administered 2023-07-10: 2 [IU] via SUBCUTANEOUS
  Filled 2023-07-10: qty 1

## 2023-07-10 MED ORDER — FOLIC ACID 1 MG PO TABS
1.0000 mg | ORAL_TABLET | Freq: Every day | ORAL | Status: DC
  Administered 2023-07-10 – 2023-07-22 (×12): 1 mg via ORAL
  Filled 2023-07-10 (×13): qty 1

## 2023-07-10 MED ORDER — INSULIN ASPART 100 UNIT/ML IJ SOLN: 0.0000 [IU] | Freq: Every day | INTRAMUSCULAR | Status: DC

## 2023-07-10 MED ORDER — ONDANSETRON HCL 4 MG PO TABS
4.0000 mg | ORAL_TABLET | Freq: Four times a day (QID) | ORAL | Status: DC | PRN
Start: 2023-07-10 — End: 2023-07-22

## 2023-07-10 MED ORDER — AMITRIPTYLINE HCL 75 MG PO TABS
75.0000 mg | ORAL_TABLET | Freq: Every day | ORAL | Status: DC
Start: 2023-07-10 — End: 2023-07-10
  Filled 2023-07-10: qty 1

## 2023-07-10 MED ORDER — GABAPENTIN 300 MG PO CAPS
300.0000 mg | ORAL_CAPSULE | Freq: Every day | ORAL | Status: AC
  Administered 2023-07-11 – 2023-07-12 (×2): 300 mg via ORAL
  Filled 2023-07-10 (×2): qty 1

## 2023-07-10 MED ORDER — AMITRIPTYLINE HCL 25 MG PO TABS
75.0000 mg | ORAL_TABLET | Freq: Every day | ORAL | Status: DC
  Administered 2023-07-10 – 2023-07-21 (×12): 75 mg via ORAL
  Filled 2023-07-10 (×12): qty 3

## 2023-07-10 MED ORDER — ROSUVASTATIN CALCIUM 10 MG PO TABS
40.0000 mg | ORAL_TABLET | Freq: Every day | ORAL | Status: DC
Start: 2023-07-10 — End: 2023-07-22
  Administered 2023-07-10 – 2023-07-22 (×12): 40 mg via ORAL
  Filled 2023-07-10 (×12): qty 4

## 2023-07-10 MED ORDER — SODIUM CHLORIDE 0.9 % IV SOLN: INTRAVENOUS | Status: DC

## 2023-07-10 MED ORDER — LORAZEPAM 2 MG/ML IJ SOLN
1.0000 mg | INTRAMUSCULAR | Status: AC | PRN
  Administered 2023-07-11: 2 mg via INTRAVENOUS
  Filled 2023-07-10: qty 2
  Filled 2023-07-10: qty 1

## 2023-07-10 MED ORDER — SODIUM CHLORIDE 0.9 % IV SOLN: INTRAVENOUS | Status: AC

## 2023-07-10 MED ORDER — GABAPENTIN 300 MG PO CAPS
300.0000 mg | ORAL_CAPSULE | Freq: Two times a day (BID) | ORAL | Status: DC
  Administered 2023-07-10 (×2): 300 mg via ORAL
  Filled 2023-07-10 (×2): qty 1

## 2023-07-10 MED ORDER — ALBUTEROL SULFATE (2.5 MG/3ML) 0.083% IN NEBU: 2.5000 mg | INHALATION_SOLUTION | RESPIRATORY_TRACT | Status: DC | PRN

## 2023-07-10 MED ORDER — ACETAMINOPHEN 650 MG RE SUPP
650.0000 mg | Freq: Four times a day (QID) | RECTAL | Status: DC | PRN
Start: 2023-07-10 — End: 2023-07-22

## 2023-07-10 MED ORDER — ACETAMINOPHEN 325 MG PO TABS
650.0000 mg | ORAL_TABLET | Freq: Four times a day (QID) | ORAL | Status: DC | PRN
  Administered 2023-07-16 – 2023-07-19 (×3): 650 mg via ORAL
  Filled 2023-07-10: qty 2

## 2023-07-10 NOTE — NC FL2 (Signed)
 [place] MEDICAID FL2 LEVEL OF CARE FORM     IDENTIFICATION  Patient Name: [name] Birthdate: [birth year] Sex: male Admission Date (Current Location): 07/09/2023  [facility] and [place] Number:  Chiropodist and Address:  [facility], [address], [place], [place] [address]      Provider Number: [id number]  Attending Physician Name and Address:  Darlin Priestly, MD  Relative Name and Phone Number:       Current Level of Care: Hospital Recommended Level of Care: Skilled Nursing Facility Prior Approval Number:    Date Approved/Denied:   PASRR Number: Manual review  Discharge Plan: SNF    Current Diagnoses: Patient Active Problem List   Diagnosis Date Noted   Anemia 07/10/2023   High anion gap metabolic acidosis 07/10/2023   Frequent falls 07/10/2023   Alcohol use disorder 07/10/2023   AKI (acute kidney injury) (HCC) 07/09/2023   Diabetic polyneuropathy associated with type 2 diabetes mellitus (HCC) 06/25/2023   History of calculus of gallbladder 06/25/2023   Dementia without behavioral disturbance (HCC) 02/16/2023   Mood disorder (HCC) 01/26/2023   Adjustment insomnia 01/26/2023   Infestation by bed bug 01/26/2023   Complex regional pain syndrome type 2 of right upper extremity 09/12/2022   Type 2 diabetes mellitus without complication, without long-term current use of insulin (HCC) 06/28/2022   Chronic respiratory failure with hypoxia (HCC) 05/04/2022   Thrombocytopenia (HCC)    Hypertension associated with diabetes (HCC)    Chemotherapy induced neutropenia (HCC) 07/20/2019   COPD (chronic obstructive pulmonary disease) (HCC) 06/03/2019   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   Vitamin B12 deficiency 10/24/2018   Hyperlipidemia associated with type 2 diabetes mellitus (HCC) 03/03/2018   History of hepatitis C 08/19/2017   Gastroesophageal reflux disease without esophagitis 08/19/2017   Complex regional pain syndrome  of both lower extremities 01/05/2015   Seizure disorder (HCC) 11/29/2013   Essential hypertension 11/29/2013   Tobacco use disorder 11/29/2013    Orientation RESPIRATION BLADDER Height & Weight     Self, Time, Situation, Place  Normal Continent, External catheter Weight: 207 lb 3.7 oz (94 kg) Height:  5\' 9"  (175.3 cm)  BEHAVIORAL SYMPTOMS/MOOD NEUROLOGICAL BOWEL NUTRITION STATUS   (None)  (Seizure disorder, Dementia) Continent Diet (Heart healthy/carb modified)  AMBULATORY STATUS COMMUNICATION OF NEEDS Skin   Limited Assist Verbally Skin abrasions                       Personal Care Assistance Level of Assistance              Functional Limitations Info  Sight, Hearing, Speech Sight Info: Adequate Hearing Info: Adequate Speech Info: Adequate    SPECIAL CARE FACTORS FREQUENCY  PT (By licensed PT)     PT Frequency: 5 x week              Contractures Contractures Info: Not present    Additional Factors Info  Code Status, Allergies Code Status Info: DNR Allergies Info: Opana (Oxymorphone Hcl), Elemental Sulfur, Sulfa Antibiotics           Current Medications (07/10/2023):  This is the current hospital active medication list Current Facility-Administered Medications  Medication Dose Route Frequency Provider Last Rate Last Admin   0.9 %  sodium chloride infusion   Intravenous Continuous Wendee Beavers, NP 75 mL/hr at 07/10/23 1437 New Bag at 07/10/23 1437   acetaminophen (TYLENOL) tablet 650 mg  650 mg Oral Q6H PRN Lindajo Royal  V, MD       Or   acetaminophen (TYLENOL) suppository 650 mg  650 mg Rectal Q6H PRN Andris Baumann, MD       albuterol (PROVENTIL) (2.5 MG/3ML) 0.083% nebulizer solution 2.5 mg  2.5 mg Nebulization Q2H PRN Andris Baumann, MD       amitriptyline (ELAVIL) tablet 75 mg  75 mg Oral QHS Andris Baumann, MD   75 mg at 07/10/23 0349   aspirin EC tablet 81 mg  81 mg Oral Daily Andris Baumann, MD   81 mg at 07/10/23 4098   folic acid  (FOLVITE) tablet 1 mg  1 mg Oral Daily Lindajo Royal V, MD   1 mg at 07/10/23 0959   [START ON 07/11/2023] gabapentin (NEURONTIN) capsule 300 mg  300 mg Oral Daily Darlin Priestly, MD       Followed by   Melene Muller ON 07/13/2023] gabapentin (NEURONTIN) capsule 200 mg  200 mg Oral Q1200 Darlin Priestly, MD       HYDROcodone-acetaminophen (NORCO/VICODIN) 5-325 MG per tablet 1-2 tablet  1-2 tablet Oral Q4H PRN Andris Baumann, MD   1 tablet at 07/10/23 0959   insulin aspart (novoLOG) injection 0-15 Units  0-15 Units Subcutaneous TID WC Lindajo Royal V, MD   2 Units at 07/10/23 1238   insulin aspart (novoLOG) injection 0-5 Units  0-5 Units Subcutaneous QHS Andris Baumann, MD       LORazepam (ATIVAN) tablet 1-4 mg  1-4 mg Oral Q1H PRN Andris Baumann, MD       Or   LORazepam (ATIVAN) injection 1-4 mg  1-4 mg Intravenous Q1H PRN Andris Baumann, MD       multivitamin with minerals tablet 1 tablet  1 tablet Oral Daily Andris Baumann, MD   1 tablet at 07/10/23 1000   nicotine (NICODERM CQ - dosed in mg/24 hours) patch 14 mg  14 mg Transdermal Daily Lindajo Royal V, MD   14 mg at 07/10/23 1000   ondansetron (ZOFRAN) tablet 4 mg  4 mg Oral Q6H PRN Andris Baumann, MD       Or   ondansetron [facility]) injection 4 mg  4 mg Intravenous Q6H PRN Andris Baumann, MD       Oral care mouth rinse  15 mL Mouth Rinse PRN Andris Baumann, MD       rosuvastatin (CRESTOR) tablet 40 mg  40 mg Oral Daily Lindajo Royal V, MD   40 mg at 07/10/23 1191   thiamine (VITAMIN B1) tablet 100 mg  100 mg Oral Daily Andris Baumann, MD   100 mg at 07/10/23 1000   Facility-Administered Medications Ordered in Other Encounters  Medication Dose Route Frequency Provider Last Rate Last Admin   heparin lock flush 100 unit/mL  500 Units Intravenous Once Creig Hines, MD         Discharge Medications: Please see discharge summary for a list of discharge medications.  Relevant Imaging Results:  Relevant Lab Results:   Additional Information SS#:  [id number]  Margarito Liner, LCSW

## 2023-07-10 NOTE — IPAL (Signed)
  Interdisciplinary Goals of Care Family Meeting   Date carried out: 07/10/2023  Location of the meeting: Bedside  Member's involved: Physician and Bedside Registered Nurse  Durable Power of Attorney or acting medical decision maker: patient    Discussion: We discussed goals of care for CHIP CANEPA IV .   I have reviewed medical records including EPIC notes, labs and imaging, assessed the patient  to discuss major active diagnoses, plan of care, natural trajectory, prognosis, GOC, EOL wishes, disposition and options including Full code/DNI/DNR and the concept of comfort care if DNR is elected. Questions and concerns were addressed. They are  in agreement to continue current plan of care . Election for DNR/DNI status.   Code status:   Code Status: Limited: Do not attempt resuscitation (DNR) -DNR-LIMITED -Do Not Intubate/DNI    Disposition: Continue current acute care  Time spent for the meeting: 30    Andris Baumann, MD  07/10/2023, 3:39 AM

## 2023-07-10 NOTE — Assessment & Plan Note (Signed)
 Chronic respiratory failure with hypoxia Not acutely exacerbated Continue home inhalers with DuoNebs as needed and continue supplemental oxygen

## 2023-07-10 NOTE — Assessment & Plan Note (Signed)
 Nicotine patch

## 2023-07-10 NOTE — Assessment & Plan Note (Addendum)
 Generalized weakness Vitamin B12 deficiency and diabetic polyneuropathy Complex regional pain syndrome of both lower extremities PT and TOC consult Follow-up vitamin B-12 level Continue amitriptyline and gabapentin Hold NSAIDs for pain due to AKI

## 2023-07-10 NOTE — TOC PASRR Note (Signed)
 Re: [name] Date of Birth: April 26, 1951 Date: 07/10/2023   To Whom It May Concern:  Please be advised that the above-name patient's dementia diagnosis is primary and supersedes his mental illness.

## 2023-07-10 NOTE — Assessment & Plan Note (Signed)
 Sliding scale insulin coverage

## 2023-07-10 NOTE — Hospital Course (Signed)
 [name]

## 2023-07-10 NOTE — TOC PASRR Note (Signed)
 RE: [name] Date of Birth: 04-26-1951 Date: 07/10/2023   To Whom It May Concern:  Please be advised that the above-named patient will require a short-term nursing home stay - anticipated 30 days or less for rehabilitation and strengthening.  The plan is for return home.

## 2023-07-10 NOTE — Assessment & Plan Note (Addendum)
 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  Followed by oncology and ENT

## 2023-07-10 NOTE — Assessment & Plan Note (Signed)
 Thiamine folate and multivitamin EtOH level CIWA withdrawal protocol

## 2023-07-10 NOTE — Assessment & Plan Note (Signed)
 Delirium precautions

## 2023-07-10 NOTE — H&P (Signed)
 History and Physical    Patient: [name]:[id number] DOB: Dec 20, 1950 DOA: 07/09/2023 DOS: the patient was seen and examined on 07/10/2023 PCP: Sallee Provencal, FNP  Patient coming from: Home  Chief Complaint:  Chief Complaint  Patient presents with   Fall    HPI: [name] is a 73 y.o. 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  He feels like his knees just gives out and he falls.  He hit his head on 1 occasion but did not lose consciousness.  He complains of neck pain.  He denies cough, fever, vomiting or diarrhea chest pain or shortness of breath and denies decreased appetite.  Recently had a change in his medication, started on lisinopril.  He has also been using ibuprofen due to pain from his falls. ED course and data review: Vitals unremarkable Creatinine 8.29 up from a baseline of 1.2 just a couple weeks prior with bicarb of 18, anion gap 18 and normal potassium 3.7.  BUN 38.  Sodium 121 Troponin 26 CBC with normal WBC and hemoglobin 12.5 down from 14 a couple weeks prior.  Platelets 76,000 and was normal at 155,00 2 weeks ago EtOH and hepatic function panel pending EKG showed NSR at 84 no acute ST-T wave changes Trauma imaging with CT head and C-spine with no acute injury Chest x-ray no acute intrathoracic process  Patient treated with fentanyl for pain and Zofran and given a NS bolus 1 L  Hospitalist consulted for admission for AKI.     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    COPD (chronic obstructive pulmonary  disease) (HCC)    Depression 08/19/2017   Hepatitis C    Hypertension    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HCC)    Seizures (HCC) 2010   Sleep apnea    Syncope    questionable vasovagal   Past Surgical History:  Procedure Laterality Date   COLONOSCOPY WITH PROPOFOL N/A 12/16/2016   Procedure: COLONOSCOPY WITH PROPOFOL;  Surgeon: Christena Deem, MD;  Location: [facility] ENDOSCOPY;  Service: Endoscopy;  Laterality: N/A;   COLONOSCOPY WITH PROPOFOL N/A 03/14/2017   Procedure: COLONOSCOPY WITH PROPOFOL;  Surgeon: Christena Deem, MD;  Location: [facility] ENDOSCOPY;  Service: Endoscopy;  Laterality: N/A;   COLONOSCOPY WITH PROPOFOL     FOOT SURGERY Right 03/12/2002   FOOT SURGERY     HEMORRHOID SURGERY     LITHOTRIPSY     MYRINGOTOMY     MYRINGOTOMY WITH TUBE PLACEMENT Left 04/28/2019   Procedure: MYRINGOTOMY WITH TUBE PLACEMENT;  Surgeon: Bud Face, MD;  Location: [facility];  Service: ENT;  Laterality: Left;   NASOPHARYNGOSCOPY N/A 04/28/2019   Procedure: NASOPHARYNGOSCOPY WITH BIOPSY;  Surgeon: Bud Face, MD;  Location: [facility];  Service: ENT;  Laterality: N/A;   NASOPHARYNGOSCOPY     Port a cath Placement     PORTA CATH INSERTION N/A 05/20/2019   Procedure: PORTA CATH INSERTION;  Surgeon: Annice Needy, MD;  Location: [facility] INVASIVE CV LAB;  Service: Cardiovascular;  Laterality: N/A;   PORTA CATH REMOVAL  N/A 11/29/2020   Procedure: PORTA CATH REMOVAL;  Surgeon: Annice Needy, MD;  Location: [facility] INVASIVE CV LAB;  Service: Cardiovascular;  Laterality: N/A;   TONSILLECTOMY     Social History:  reports that he quit smoking about 15 months ago. His smoking use included cigarettes. He started smoking about 51 years ago. He has a 25 pack-year smoking history. He has been exposed to tobacco smoke. He has never used smokeless tobacco. He reports that he does not currently use alcohol after a past usage of about 14.0 standard drinks of alcohol per week. He reports that he does  not currently use drugs.  Allergies  Allergen Reactions   Opana [Oxymorphone Hcl]     Made him BLACKOUT   Elemental Sulfur     Childhood reaction    Sulfa Antibiotics Other (See Comments)   Sulfa Antibiotics     Family History  Problem Relation Age of Onset   Arthritis Mother    Hypertension Mother    Cancer Father    Kidney disease Father    Alcohol abuse Paternal Aunt    Depression Maternal Grandfather     Prior to Admission medications   Medication Sig Start Date End Date Taking? Authorizing Provider  acetaminophen (TYLENOL) 500 MG tablet Take 2 tablets (1,000 mg total) by mouth every 6 (six) hours as needed for mild pain (pain score 1-3). 04/17/23   Henrene Dodge, MD  amitriptyline (ELAVIL) 75 MG tablet Take 1 tablet (75 mg total) by mouth at bedtime. 06/25/23   Sallee Provencal, FNP  amLODipine (NORVASC) 10 MG tablet Take 1 tablet (10 mg total) by mouth daily. 01/24/23   [name], FNP  antiseptic oral rinse (BIOTENE) LIQD 15 mLs by Mouth Rinse route as needed for dry mouth.    [provider]  aspirin EC 81 MG tablet Take 81 mg by mouth daily. Swallow whole.    [provider]  Blood Glucose Monitoring Suppl DEVI 1 each by Does not apply route in the morning, at noon, and at bedtime. May substitute to any manufacturer covered by patient's insurance. Pt's current glucometer is broken. 10/28/22   Alfredia Ferguson, PA-C  gabapentin (NEURONTIN) 300 MG capsule Take 1 capsule (300 mg total) by mouth 2 (two) times daily for 15 days. 04/17/23 05/02/23  Henrene Dodge, MD  ibuprofen (ADVIL) 600 MG tablet Take 1 tablet (600 mg total) by mouth every 8 (eight) hours as needed for moderate pain (pain score 4-6). 04/17/23   Henrene Dodge, MD  lisinopril (ZESTRIL) 10 MG tablet Take 1 tablet (10 mg total) by mouth daily. 06/25/23   Sallee Provencal, FNP  nystatin (MYCOSTATIN) 100000 UNIT/ML suspension Take 5 mLs by mouth daily as needed (thrush). 02/06/23   [provider]  omeprazole (PRILOSEC) 40 MG capsule TAKE 1 CAPSULE BY MOUTH ONCE DAILY 10/23/22   Ok Edwards, Lillia Abed, PA-C  rosuvastatin (CRESTOR) 40 MG tablet Take 1 tablet (40 mg total) by mouth daily. 01/24/23   [name], FNP    Physical Exam: Vitals:   07/09/23 1912 07/09/23 1913 07/09/23 1915 07/10/23 0000  BP:   104/84 (!) 123/38  Pulse:  88  71  Resp:  16  15  Temp:  98.8 F (37.1 C)  98.4 F (36.9 C)  TempSrc:  Oral  Oral  SpO2:  97%  92%  Weight: 90.7 kg     Height: 5\' 9"  (1.753 m)      Physical Exam Vitals and nursing note  reviewed.  Constitutional:      General: He is not in acute distress. HENT:     Head: Normocephalic and atraumatic.  Cardiovascular:     Rate and Rhythm: Normal rate and regular rhythm.     Heart sounds: Normal heart sounds.  Pulmonary:     Effort: Pulmonary effort is normal.     Breath sounds: Normal breath sounds.  Abdominal:     Palpations: Abdomen is soft.     Tenderness: There is no abdominal tenderness.  Neurological:     Mental Status: Mental status is at baseline.     Labs on Admission: I have personally reviewed following labs and imaging studies  CBC: Recent Labs  Lab 07/09/23 1916  WBC 6.8  HGB 12.5*  HCT 36.9*  MCV 96.3  PLT 76*   Basic Metabolic Panel: Recent Labs  Lab 07/09/23 1916  NA 131*  K 3.7  CL 95*  CO2 18*  GLUCOSE 116*  BUN 38*  CREATININE 8.29*  CALCIUM 8.1*   GFR: Estimated Creatinine Clearance: 9.2 mL/min (A) (by C-G formula based on SCr of 8.29 mg/dL (H)). Liver Function Tests: Recent Labs  Lab 07/09/23 1916  AST 48*  ALT 20  ALKPHOS 64  BILITOT 1.1  PROT 7.2  ALBUMIN 3.8   No results for input(s): "LIPASE", "AMYLASE" in the last 168 hours. No results for input(s): "AMMONIA" in the last 168 hours. Coagulation Profile: Recent Labs  Lab 07/09/23 2340  INR 1.1   Cardiac Enzymes: No results for input(s): "CKTOTAL", "CKMB", "CKMBINDEX", "TROPONINI" in the last 168 hours. BNP (last 3  results) No results for input(s): "PROBNP" in the last 8760 hours. HbA1C: No results for input(s): "HGBA1C" in the last 72 hours. CBG: No results for input(s): "GLUCAP" in the last 168 hours. Lipid Profile: No results for input(s): "CHOL", "HDL", "LDLCALC", "TRIG", "CHOLHDL", "LDLDIRECT" in the last 72 hours. Thyroid Function Tests: No results for input(s): "TSH", "T4TOTAL", "FREET4", "T3FREE", "THYROIDAB" in the last 72 hours. Anemia Panel: No results for input(s): "VITAMINB12", "FOLATE", "FERRITIN", "TIBC", "IRON", "RETICCTPCT" in the last 72 hours. Urine analysis:    Component Value Date/Time   COLORURINE YELLOW (A) 09/25/2022 0644   APPEARANCEUR CLEAR (A) 09/25/2022 0644   APPEARANCEUR Clear 07/31/2021 1016   LABSPEC 1.008 09/25/2022 0644   LABSPEC 1.002 12/17/2013 1755   PHURINE 5.0 09/25/2022 0644   GLUCOSEU NEGATIVE 09/25/2022 0644   GLUCOSEU Negative 12/17/2013 1755   HGBUR NEGATIVE 09/25/2022 0644   BILIRUBINUR NEGATIVE 09/25/2022 0644   BILIRUBINUR Negative 07/31/2021 1016   BILIRUBINUR Negative 12/17/2013 1755   KETONESUR NEGATIVE 09/25/2022 0644   PROTEINUR NEGATIVE 09/25/2022 0644   UROBILINOGEN 2.0 (H) 11/29/2013 1404   NITRITE NEGATIVE 09/25/2022 0644   LEUKOCYTESUR NEGATIVE 09/25/2022 0644   LEUKOCYTESUR Negative 12/17/2013 1755    Radiological Exams on Admission: CT Cervical Spine Wo Contrast Result Date: 07/09/2023 CLINICAL DATA:  Larey Seat, hit head EXAM: CT CERVICAL SPINE WITHOUT CONTRAST TECHNIQUE: Multidetector CT imaging of the cervical spine was performed without intravenous contrast. Multiplanar CT image reconstructions were also generated. RADIATION DOSE REDUCTION: This exam was performed according to the departmental dose-optimization program which includes automated exposure control, adjustment of the mA and/or kV according to patient size and/or use of iterative reconstruction technique. COMPARISON:  11/03/2019 FINDINGS: Alignment: Straightening of the  cervical spine is likely positional. Otherwise alignment is anatomic. Skull base and vertebrae: No acute fracture. No primary bone lesion or focal pathologic process. Soft tissues and spinal canal: No prevertebral fluid  or swelling. No visible canal hematoma. Disc levels: There is progressive multilevel spondylosis most pronounced at the C5-6 and C6-7 levels. Mild diffuse facet hypertrophy. Upper chest: Airway is patent.  Lung apices are clear. Other: Reconstructed images demonstrate no additional findings. IMPRESSION: 1. No acute cervical spine fracture. 2. Progressive multilevel cervical spondylosis, greatest at C5-6 and C6-7. Electronically Signed   By: Sharlet Salina M.D.   On: 07/09/2023 20:08   CT HEAD WO CONTRAST ( ) Result Date: 07/09/2023 CLINICAL DATA:  Multiple falls, hit head EXAM: CT HEAD WITHOUT CONTRAST TECHNIQUE: Contiguous axial images were obtained from the base of the skull through the vertex without intravenous contrast. RADIATION DOSE REDUCTION: This exam was performed according to the departmental dose-optimization program which includes automated exposure control, adjustment of the mA and/or kV according to patient size and/or use of iterative reconstruction technique. COMPARISON:  09/25/2022 FINDINGS: Brain: No acute infarct or hemorrhage. Lateral ventricles and midline structures are unremarkable. No acute extra-axial fluid collections. No mass effect. Vascular: No hyperdense vessel or unexpected calcification. Skull: Normal. Negative for fracture or focal lesion. Sinuses/Orbits: No acute finding. Other: None. IMPRESSION: 1. No acute intracranial process. Electronically Signed   By: Sharlet Salina M.D.   On: 07/09/2023 20:06   DG Chest 2 View Result Date: 07/09/2023 CLINICAL DATA:  Multiple falls EXAM: CHEST - 2 VIEW COMPARISON:  02/15/2023 FINDINGS: Frontal and lateral views of the chest demonstrate an unremarkable cardiac silhouette. No acute airspace disease, effusion, or  pneumothorax. No acute bony abnormalities. IMPRESSION: 1. No acute intrathoracic process. Electronically Signed   By: Sharlet Salina M.D.   On: 07/09/2023 20:04     Data Reviewed: Relevant notes from primary care and specialist visits, past discharge summaries as available in EHR, including Care Everywhere. Prior diagnostic testing as pertinent to current admission diagnoses Updated medications and problem lists for reconciliation ED course, including vitals, labs, imaging, treatment and response to treatment Triage notes, nursing and pharmacy notes and ED provider's notes Notable results as noted in HPI   Assessment and Plan: * AKI (acute kidney injury) (HCC) High anion gap metabolic acidosis Suspect secondary to ibuprofen use, possibly recent lisinopril Hold NSAIDs West hydration Renal ultrasound, get UA Nephrology consult Monitor renal function and avoid nephrotoxins  Anemia Thrombocytopenia Hemoglobin 12 down from 14 just a couple weeks prior Platelet 76,000, down from 155 Etiology uncertain, possibly related to kidney disease, bone marrow suppression, hemolytic uremic syndrome, other -Will get anemia panel, peripheral smear, -SCD for DVT prophylaxis while platelets are in the 100,000  Frequent falls Generalized weakness Vitamin B12 deficiency and diabetic polyneuropathy Complex regional pain syndrome of both lower extremities PT and TOC consult Follow-up vitamin B-12 level Continue amitriptyline and gabapentin Hold NSAIDs for pain due to AKI  COPD (chronic obstructive pulmonary disease) (HCC) Chronic respiratory failure with hypoxia Not acutely exacerbated Continue home inhalers with DuoNebs as needed and continue supplemental oxygen  Alcohol use disorder Thiamine folate and multivitamin EtOH level CIWA withdrawal protocol  Dementia without behavioral disturbance (HCC) Delirium precautions  Type 2 diabetes mellitus without complication, without long-term  current use of insulin (HCC) Sliding scale insulin coverage  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  Followed by oncology and ENT  History of hepatitis C Given thrombocytopenia will get acute hepatitis panel  Tobacco use disorder Nicotine patch  Essential hypertension Blood pressure on the soft side so will hold lisinopril and amlodipine to resume  as appropriate Continue West fluids  Seizure disorder (HCC) Not on specific AEDs Continue gabapentin 300 twice daily with monitoring of kidney function     DVT prophylaxis: Lovenox  Consults: renal, Dr Cherylann Ratel  Advance Care Planning:   Code Status: Limited: Do not attempt resuscitation (DNR) -DNR-LIMITED -Do Not Intubate/DNI    Family Communication: none  Disposition Plan: Back to previous home environment  Severity of Illness: The appropriate patient status for this patient is INPATIENT. Inpatient status is judged to be reasonable and necessary in order to provide the required intensity of service to ensure the patient's safety. The patient's presenting symptoms, physical exam findings, and initial radiographic and laboratory data in the context of their chronic comorbidities is felt to place them at high risk for further clinical deterioration. Furthermore, it is not anticipated that the patient will be medically stable for discharge from the hospital within 2 midnights of admission.   * I certify that at the point of admission it is my clinical judgment that the patient will require inpatient hospital care spanning beyond 2 midnights from the point of admission due to high intensity of service, high risk for further deterioration and high frequency of surveillance required.*  Author: Andris Baumann, MD 07/10/2023 12:31 AM  For on call review [email or web address].

## 2023-07-10 NOTE — Assessment & Plan Note (Deleted)
Continue amitriptyline and gabapentin. 

## 2023-07-10 NOTE — Assessment & Plan Note (Signed)
 Blood pressure on the soft side so will hold lisinopril and amlodipine to resume as appropriate Continue IV fluids

## 2023-07-10 NOTE — Consult Note (Signed)
 [facility]  CONSULT NOTE    Date: 07/10/2023                  Patient Name:  [name]  MRN: [id number]  DOB: 06-Nov-1950  Age / Sex: 73 y.o., male         PCP: Sallee Provencal, FNP                 Service Requesting Consult: TRH                 Reason for Consult: Acute kidney injury            History of Present Illness: [name] is a 73 y.o.  male with past medical conditions including alcohol use disorder, COPD with 2 L nasal cannula, diet-controlled diabetes, hepatitis C, seizure disorder, hypertension, and vitamin B12 deficiency, who was admitted to [facility] on 07/09/2023 for Thrombocytopenia (HCC) [D69.6] AKI (acute kidney injury) (HCC) [N17.9] Frequent falls [R29.6] Increased anion gap metabolic acidosis [E87.29] Injury of head, initial encounter [S09.90XA]  Patient presents to the emergency department after sustaining a fall.  Patient states he has had multiple falls recently.  Patient seen at bedside, no family present.  Ill-appearing.  Does also endorse decreased appetite for 4 to 5 days.  NSAID use, states he uses ibuprofen twice daily.  Labs on ED arrival significant for sodium 131, serum bicarb 18, glucose 116, BUN 38, creatinine 8.29 with GFR 6, troponin 26, and hemoglobin 12.4.  Chest x-ray negative for acute findings.CT head and cervical spine also negative for acute findings.   Medications: Outpatient medications: Medications Prior to Admission  Medication Sig Dispense Refill Last Dose/Taking   acetaminophen (TYLENOL) 500 MG tablet Take 2 tablets (1,000 mg total) by mouth every 6 (six) hours as needed for mild pain (pain score 1-3).      amitriptyline (ELAVIL) 75 MG tablet Take 1 tablet (75 mg total) by mouth at bedtime. 30 tablet 5    amLODipine (NORVASC) 10 MG tablet Take 1 tablet (10 mg total) by mouth daily. 90 tablet 3    antiseptic oral rinse (BIOTENE) LIQD 15 mLs by Mouth Rinse route as needed for dry  mouth.      aspirin EC 81 MG tablet Take 81 mg by mouth daily. Swallow whole.      Blood Glucose Monitoring Suppl DEVI 1 each by Does not apply route in the morning, at noon, and at bedtime. May substitute to any manufacturer covered by patient's insurance. Pt's current glucometer is broken. 1 each 0    gabapentin (NEURONTIN) 300 MG capsule Take 1 capsule (300 mg total) by mouth 2 (two) times daily for 15 days. 30 capsule 0    ibuprofen (ADVIL) 600 MG tablet Take 1 tablet (600 mg total) by mouth every 8 (eight) hours as needed for moderate pain (pain score 4-6). 60 tablet 1    lisinopril (ZESTRIL) 10 MG tablet Take 1 tablet (10 mg total) by mouth daily. 90 tablet 1    nystatin (MYCOSTATIN) 100000 UNIT/ML suspension Take 5 mLs by mouth daily as needed (thrush).      omeprazole (PRILOSEC) 40 MG capsule TAKE 1 CAPSULE BY MOUTH ONCE DAILY 90 capsule 1    rosuvastatin (CRESTOR) 40 MG tablet Take 1 tablet (40 mg total) by mouth daily. 90 tablet 3     Current medications: Current Facility-Administered Medications  Medication Dose Route Frequency Provider Last Rate Last Admin  acetaminophen (TYLENOL) tablet 650 mg  650 mg Oral Q6H PRN Andris Baumann, MD       Or   acetaminophen (TYLENOL) suppository 650 mg  650 mg Rectal Q6H PRN Andris Baumann, MD       albuterol (PROVENTIL) (2.5 MG/3ML) 0.083% nebulizer solution 2.5 mg  2.5 mg Nebulization Q2H PRN Andris Baumann, MD       amitriptyline (ELAVIL) tablet 75 mg  75 mg Oral QHS Andris Baumann, MD   75 mg at 07/10/23 0349   aspirin EC tablet 81 mg  81 mg Oral Daily Andris Baumann, MD   81 mg at 07/10/23 4098   folic acid (FOLVITE) tablet 1 mg  1 mg Oral Daily Lindajo Royal V, MD   1 mg at 07/10/23 1191   gabapentin (NEURONTIN) capsule 300 mg  300 mg Oral BID Andris Baumann, MD   300 mg at 07/10/23 0959   HYDROcodone-acetaminophen (NORCO/VICODIN) 5-325 MG per tablet 1-2 tablet  1-2 tablet Oral Q4H PRN Andris Baumann, MD   1 tablet at 07/10/23 0959    insulin aspart (novoLOG) injection 0-15 Units  0-15 Units Subcutaneous TID WC Lindajo Royal V, MD   2 Units at 07/10/23 1238   insulin aspart (novoLOG) injection 0-5 Units  0-5 Units Subcutaneous QHS Andris Baumann, MD       LORazepam (ATIVAN) tablet 1-4 mg  1-4 mg Oral Q1H PRN Andris Baumann, MD       Or   LORazepam (ATIVAN) injection 1-4 mg  1-4 mg Intravenous Q1H PRN Andris Baumann, MD       multivitamin with minerals tablet 1 tablet  1 tablet Oral Daily Andris Baumann, MD   1 tablet at 07/10/23 1000   nicotine (NICODERM CQ - dosed in mg/24 hours) patch 14 mg  14 mg Transdermal Daily Lindajo Royal V, MD   14 mg at 07/10/23 1000   ondansetron (ZOFRAN) tablet 4 mg  4 mg Oral Q6H PRN Andris Baumann, MD       Or   ondansetron [facility]) injection 4 mg  4 mg Intravenous Q6H PRN Andris Baumann, MD       Oral care mouth rinse  15 mL Mouth Rinse PRN Andris Baumann, MD       rosuvastatin (CRESTOR) tablet 40 mg  40 mg Oral Daily Lindajo Royal V, MD   40 mg at 07/10/23 4782   thiamine (VITAMIN B1) tablet 100 mg  100 mg Oral Daily Andris Baumann, MD   100 mg at 07/10/23 1000   Facility-Administered Medications Ordered in Other Encounters  Medication Dose Route Frequency Provider Last Rate Last Admin   heparin lock flush 100 unit/mL  500 Units Intravenous Once Creig Hines, MD          Allergies: Allergies  Allergen Reactions   Opana [Oxymorphone Hcl]     Made him BLACKOUT   Elemental Sulfur     Childhood reaction    Sulfa Antibiotics Other (See Comments)   Sulfa Antibiotics       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    COPD (chronic obstructive pulmonary disease) (HCC)    Depression 08/19/2017   Hepatitis C    Hypertension    Nasopharyngeal  cancer (HCC)    Seizures (HCC) 2010   Sleep apnea     Syncope    questionable vasovagal     Past Surgical History: Past Surgical History:  Procedure Laterality Date   COLONOSCOPY WITH PROPOFOL N/A 12/16/2016   Procedure: COLONOSCOPY WITH PROPOFOL;  Surgeon: Christena Deem, MD;  Location: [facility] ENDOSCOPY;  Service: Endoscopy;  Laterality: N/A;   COLONOSCOPY WITH PROPOFOL N/A 03/14/2017   Procedure: COLONOSCOPY WITH PROPOFOL;  Surgeon: Christena Deem, MD;  Location: [facility] ENDOSCOPY;  Service: Endoscopy;  Laterality: N/A;   COLONOSCOPY WITH PROPOFOL     FOOT SURGERY Right 03/12/2002   FOOT SURGERY     HEMORRHOID SURGERY     LITHOTRIPSY     MYRINGOTOMY     MYRINGOTOMY WITH TUBE PLACEMENT Left 04/28/2019   Procedure: MYRINGOTOMY WITH TUBE PLACEMENT;  Surgeon: Bud Face, MD;  Location: [facility] SURGERY CNTR;  Service: ENT;  Laterality: Left;   NASOPHARYNGOSCOPY N/A 04/28/2019   Procedure: NASOPHARYNGOSCOPY WITH BIOPSY;  Surgeon: Bud Face, MD;  Location: [facility] SURGERY [facility];  Service: ENT;  Laterality: N/A;   NASOPHARYNGOSCOPY     Port a cath Placement     PORTA CATH INSERTION N/A 05/20/2019   Procedure: PORTA CATH INSERTION;  Surgeon: Annice Needy, MD;  Location: [facility] INVASIVE CV LAB;  Service: Cardiovascular;  Laterality: N/A;   PORTA CATH REMOVAL N/A 11/29/2020   Procedure: PORTA CATH REMOVAL;  Surgeon: Annice Needy, MD;  Location: [facility] INVASIVE CV LAB;  Service: Cardiovascular;  Laterality: N/A;   TONSILLECTOMY       Family History: Family History  Problem Relation Age of Onset   Arthritis Mother    Hypertension Mother    Cancer Father    Kidney disease Father    Alcohol abuse Paternal Aunt    Depression Maternal Grandfather      Social History: Social History   Socioeconomic History   Marital status: Divorced    Spouse name: Not on file   Number of children: Not on file   Years of education: Not on file   Highest education level: Not on file  Occupational History   Not on file  Tobacco Use   Smoking  status: Former    Current packs/day: 0.00    Average packs/day: 0.5 packs/day for 50.0 years (25.0 ttl pk-yrs)    Types: Cigarettes    Start date: 04/04/1972    Quit date: 04/04/2022    Years since quitting: 1.2    Passive exposure: Past   Smokeless tobacco: Never  Vaping Use   Vaping status: Never Used  Substance and Sexual Activity   Alcohol use: Not Currently    Alcohol/week: 14.0 standard drinks of alcohol    Types: 14 Cans of beer per week   Drug use: Not Currently   Sexual activity: Not Currently  Other Topics Concern   Not on file  Social History Narrative   ** Merged History Encounter **       Social Drivers of Health   Financial Resource Strain: Low Risk  (10/22/2022)   Overall Financial Resource Strain (CARDIA)    Difficulty of Paying Living Expenses: Not hard at all  Food Insecurity: No Food Insecurity (02/16/2023)   Hunger Vital Sign    Worried About Running Out of Food in the Last Year: Never true    Ran Out of Food in the Last Year: Never true  Transportation Needs: No Transportation Needs (02/16/2023)   PRAPARE - Transportation    Lack  of Transportation (Medical): No    Lack of Transportation (Non-Medical): No  Physical Activity: Inactive (10/22/2022)   Exercise Vital Sign    Days of Exercise per Week: 0 days    Minutes of Exercise per Session: 0 min  Stress: Stress Concern Present (01/29/2023)   [employer] of Occupational Health - Occupational Stress Questionnaire    Feeling of Stress : To some extent  Social Connections: Socially Isolated (10/22/2022)   Social Connection and Isolation Panel [NHANES]    Frequency of Communication with Friends and Family: Three times a week    Frequency of Social Gatherings with Friends and Family: Once a week    Attends Religious Services: Never    Database administrator or Organizations: No    Attends Banker Meetings: Never    Marital Status: Divorced  Catering manager Violence: Not At Risk  (02/16/2023)   Humiliation, Afraid, Rape, and Kick questionnaire    Fear of Current or Ex-Partner: No    Emotionally Abused: No    Physically Abused: No    Sexually Abused: No     Review of Systems: Review of Systems  Constitutional:  Negative for chills, fever and malaise/fatigue.  HENT:  Negative for congestion, sore throat and tinnitus.   Eyes:  Negative for blurred vision and redness.  Respiratory:  Negative for cough, shortness of breath and wheezing.   Cardiovascular:  Negative for chest pain, palpitations, claudication and leg swelling.  Gastrointestinal:  Negative for abdominal pain, blood in stool, diarrhea, nausea and vomiting.  Genitourinary:  Negative for flank pain, frequency and hematuria.  Musculoskeletal:  Positive for falls. Negative for back pain and myalgias.  Skin:  Negative for rash.  Neurological:  Negative for dizziness, weakness and headaches.  Endo/Heme/Allergies:  Does not bruise/bleed easily.  Psychiatric/Behavioral:  Negative for depression. The patient is not nervous/anxious and does not have insomnia.     Vital Signs: Blood pressure (!) 89/53, pulse 68, temperature (!) 97.5 F (36.4 C), temperature source Oral, resp. rate 16, height 5\' 9"  (1.753 m), weight 94 kg, SpO2 100%.  Weight trends: Filed Weights   07/09/23 1912 07/10/23 0112 07/10/23 0352  Weight: 90.7 kg 93.1 kg 94 kg    Physical Exam: General: NAD  Head: Normocephalic, atraumatic.  Dry oral mucosal membranes  Eyes: Anicteric  Lungs:  Clear to auscultation, normal effort  Heart: Regular rate and rhythm  Abdomen:  Soft, nontender, nondistended  Extremities: No peripheral edema.  Neurologic: Nonfocal, moving all four extremities  Skin: No lesions  Access: None     Lab results: Basic Metabolic Panel: Recent Labs  Lab 07/09/23 1916 07/10/23 0155  NA 131* 128*  K 3.7 3.4*  CL 95* 94*  CO2 18* 18*  GLUCOSE 116* 94  BUN 38* 40*  CREATININE 8.29* 8.52*  CALCIUM 8.1* 7.6*     Liver Function Tests: Recent Labs  Lab 07/09/23 1916  AST 48*  ALT 20  ALKPHOS 64  BILITOT 1.1  PROT 7.2  ALBUMIN 3.8   No results for input(s): "LIPASE", "AMYLASE" in the last 168 hours. No results for input(s): "AMMONIA" in the last 168 hours.  CBC: Recent Labs  Lab 07/09/23 1916 07/10/23 0155  WBC 6.9  6.8 6.0  NEUTROABS 5.5  --   HGB 12.4*  12.5* 12.0*  HCT 36.7*  36.9* 35.1*  MCV 94.6  96.3 95.4  PLT 79*  76* 68*    Cardiac Enzymes: No results for input(s): "CKTOTAL", "CKMB", "CKMBINDEX", "TROPONINI" in  the last 168 hours.  BNP: Invalid input(s): "POCBNP"  CBG: Recent Labs  Lab 07/10/23 0124 07/10/23 0846 07/10/23 1137  GLUCAP 122* 105* 128*    Microbiology: Results for orders placed or performed during the hospital encounter of 02/15/23  Resp panel by RT-PCR (RSV, Flu A&B, Covid) Anterior Nasal Swab     Status: None   Collection Time: 02/15/23  3:41 PM   Specimen: Anterior Nasal Swab  Result Value Ref Range Status   SARS Coronavirus 2 by RT PCR NEGATIVE NEGATIVE Final    Comment: (NOTE) SARS-CoV-2 target nucleic acids are NOT DETECTED.  The SARS-CoV-2 RNA is generally detectable in upper respiratory specimens during the acute phase of infection. The lowest concentration of SARS-CoV-2 viral copies this assay can detect is 138 copies/mL. A negative result does not preclude SARS-Cov-2 infection and should not be used as the sole basis for treatment or other patient management decisions. A negative result may occur with  improper specimen collection/handling, submission of specimen other than nasopharyngeal swab, presence of viral mutation(s) within the areas targeted by this assay, and inadequate number of viral copies(<138 copies/mL). A negative result must be combined with clinical observations, patient history, and epidemiological information. The expected result is Negative.  Fact Sheet for Patients:   BloggerCourse.com  Fact Sheet for Healthcare Providers:  SeriousBroker.it  This test is no t yet approved or cleared by the [place] FDA and  has been authorized for detection and/or diagnosis of SARS-CoV-2 by FDA under an Emergency Use Authorization (EUA). This EUA will remain  in effect (meaning this test can be used) for the duration of the COVID-19 declaration under Section 564(b)(1) of the Act, 21 U.S.C.section 360bbb-3(b)(1), unless the authorization is terminated  or revoked sooner.       Influenza A by PCR NEGATIVE NEGATIVE Final   Influenza B by PCR NEGATIVE NEGATIVE Final    Comment: (NOTE) The Xpert Xpress SARS-CoV-2/FLU/RSV plus assay is intended as an aid in the diagnosis of influenza from Nasopharyngeal swab specimens and should not be used as a sole basis for treatment. Nasal washings and aspirates are unacceptable for Xpert Xpress SARS-CoV-2/FLU/RSV testing.  Fact Sheet for Patients: BloggerCourse.com  Fact Sheet for Healthcare Providers: SeriousBroker.it  This test is not yet approved or cleared by the [place] FDA and has been authorized for detection and/or diagnosis of SARS-CoV-2 by FDA under an Emergency Use Authorization (EUA). This EUA will remain in effect (meaning this test can be used) for the duration of the COVID-19 declaration under Section 564(b)(1) of the Act, 21 U.S.C. section 360bbb-3(b)(1), unless the authorization is terminated or revoked.     Resp Syncytial Virus by PCR NEGATIVE NEGATIVE Final    Comment: (NOTE) Fact Sheet for Patients: BloggerCourse.com  Fact Sheet for Healthcare Providers: SeriousBroker.it  This test is not yet approved or cleared by the [place] FDA and has been authorized for detection and/or diagnosis of SARS-CoV-2 by FDA under an Emergency Use  Authorization (EUA). This EUA will remain in effect (meaning this test can be used) for the duration of the COVID-19 declaration under Section 564(b)(1) of the Act, 21 U.S.C. section 360bbb-3(b)(1), unless the authorization is terminated or revoked.  Performed at [facility], [address] [place], [place] [address]   Blood Culture (routine x 2)     Status: None   Collection Time: 02/15/23  7:44 PM   Specimen: Right Antecubital; Blood  Result Value Ref Range Status   Specimen Description RIGHT ANTECUBITAL  Final   Special Requests   Final    BOTTLES DRAWN AEROBIC AND ANAEROBIC Blood Culture adequate volume   Culture   Final    NO GROWTH 5 DAYS Performed at [facility], [address] [place] [place], [place] [address]    Report Status 02/20/2023 FINAL  Final  Blood Culture (routine x 2)     Status: None   Collection Time: 02/16/23  4:17 PM   Specimen: BLOOD  Result Value Ref Range Status   Specimen Description BLOOD BLOOD RIGHT ARM  Final   Special Requests   Final    BOTTLES DRAWN AEROBIC AND ANAEROBIC Blood Culture adequate volume   Culture   Final    NO GROWTH 5 DAYS Performed at [facility], [address] [place], [place] [address]    Report Status 02/21/2023 FINAL  Final    Coagulation Studies: Recent Labs    07/09/23 2340  LABPROT 14.2  INR 1.1    Urinalysis: No results for input(s): "COLORURINE", "LABSPEC", "PHURINE", "GLUCOSEU", "HGBUR", "BILIRUBINUR", "KETONESUR", "PROTEINUR", "UROBILINOGEN", "NITRITE", "LEUKOCYTESUR" in the last 72 hours.  Invalid input(s): "APPERANCEUR"    Imaging: US RENAL Result Date: 07/10/2023 CLINICAL DATA:  [id number] AKI (acute kidney injury) (HCC) [id number] EXAM: RENAL / URINARY TRACT ULTRASOUND COMPLETE COMPARISON:  None Available. FINDINGS: Right Kidney: Renal measurements: 10.3 x 5.4 x 5.3 cm = volume: 155 mL. Echogenicity within normal limits. No mass or hydronephrosis visualized. Left Kidney: Renal  measurements: 11.3 x 6.8 x 4.3 cm = volume: 172 mL mL. Echogenicity within normal limits. No mass or hydronephrosis visualized. Bladder: Appears normal for degree of bladder distention. Other: None. IMPRESSION: No hydronephrosis or nephrolithiasis. Electronically Signed   By: Lorenza Cambridge M.D.   On: 07/10/2023 12:20   CT Cervical Spine Wo Contrast Result Date: 07/09/2023 CLINICAL DATA:  Larey Seat, hit head EXAM: CT CERVICAL SPINE WITHOUT CONTRAST TECHNIQUE: Multidetector CT imaging of the cervical spine was performed without intravenous contrast. Multiplanar CT image reconstructions were also generated. RADIATION DOSE REDUCTION: This exam was performed according to the departmental dose-optimization program which includes automated exposure control, adjustment of the mA and/or kV according to patient size and/or use of iterative reconstruction technique. COMPARISON:  11/03/2019 FINDINGS: Alignment: Straightening of the cervical spine is likely positional. Otherwise alignment is anatomic. Skull base and vertebrae: No acute fracture. No primary bone lesion or focal pathologic process. Soft tissues and spinal canal: No prevertebral fluid or swelling. No visible canal hematoma. Disc levels: There is progressive multilevel spondylosis most pronounced at the C5-6 and C6-7 levels. Mild diffuse facet hypertrophy. Upper chest: Airway is patent.  Lung apices are clear. Other: Reconstructed images demonstrate no additional findings. IMPRESSION: 1. No acute cervical spine fracture. 2. Progressive multilevel cervical spondylosis, greatest at C5-6 and C6-7. Electronically Signed   By: Sharlet Salina M.D.   On: 07/09/2023 20:08   CT HEAD WO CONTRAST ( ) Result Date: 07/09/2023 CLINICAL DATA:  Multiple falls, hit head EXAM: CT HEAD WITHOUT CONTRAST TECHNIQUE: Contiguous axial images were obtained from the base of the skull through the vertex without intravenous contrast. RADIATION DOSE REDUCTION: This exam was performed  according to the departmental dose-optimization program which includes automated exposure control, adjustment of the mA and/or kV according to patient size and/or use of iterative reconstruction technique. COMPARISON:  09/25/2022 FINDINGS: Brain: No acute infarct or hemorrhage. Lateral ventricles and midline structures are unremarkable. No acute extra-axial fluid collections. No mass effect. Vascular: No hyperdense vessel or unexpected calcification. Skull: Normal. Negative  for fracture or focal lesion. Sinuses/Orbits: No acute finding. Other: None. IMPRESSION: 1. No acute intracranial process. Electronically Signed   By: Sharlet Salina M.D.   On: 07/09/2023 20:06   DG Chest 2 View Result Date: 07/09/2023 CLINICAL DATA:  Multiple falls EXAM: CHEST - 2 VIEW COMPARISON:  02/15/2023 FINDINGS: Frontal and lateral views of the chest demonstrate an unremarkable cardiac silhouette. No acute airspace disease, effusion, or pneumothorax. No acute bony abnormalities. IMPRESSION: 1. No acute intrathoracic process. Electronically Signed   By: Sharlet Salina M.D.   On: 07/09/2023 20:04     Assessment & Plan: [name] is a 73 y.o.  male with past medical conditions including alcohol use disorder, COPD with 2 L nasal cannula, diet-controlled diabetes, hepatitis C, seizure disorder, hypertension, and vitamin B12 deficiency, who was admitted to [facility] on 07/09/2023 for Thrombocytopenia (HCC) [D69.6] AKI (acute kidney injury) (HCC) [N17.9] Frequent falls [R29.6] Increased anion gap metabolic acidosis [E87.29] Injury of head, initial encounter [S09.90XA]   Acute kidney injury likely secondary to dehydration with NSAID use.  Normal renal function noted 2 weeks prior.  Renal ultrasound negative for hydronephrosis.  No recent contrast exposure.  Continue holding ibuprofen and lisinopril.  No acute indication for dialysis but monitoring closely.  Will order gentle West hydration.  Discussed with the patient  that temporary dialysis may be required if renal function does not improve.  2.  Hypertension, essential.  Home regimen includes amlodipine and lisinopril.  Both currently held.  3. Diabetes mellitus type II with chronic kidney disease/renal manifestations: noninsulin dependent. Most recent hemoglobin A1c is 6.1 on 06/25/23.  Currently diet controlled  4. Anemia with thrombocytopenia, etiology unclear Lab Results  Component Value Date   HGB 12.0 (L) 07/10/2023  Iron studies ordered by primary team Workup in progress    LOS: 1 Odyssey Vasbinder 2/27/20251:38 PM

## 2023-07-10 NOTE — Assessment & Plan Note (Addendum)
 High anion gap metabolic acidosis Suspect secondary to ibuprofen use, possibly recent lisinopril Hold NSAIDs IV hydration Renal ultrasound, get UA Nephrology consult Monitor renal function and avoid nephrotoxins

## 2023-07-10 NOTE — Evaluation (Signed)
 Physical Therapy Evaluation Patient Details Name: [name] MRN: [id number] DOB: [birth year] Today's Date: 07/10/2023  History of Present Illness  73 y.o. 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  He feels like his knees just gives out and he falls.  He hit his head on 1 occasion but did not lose consciousness.  Clinical Impression  Pt was pleasant and willing to work with PT, did need some extra cuing and reinforcement but ultimately participated well.  He c/o general, non-specific pain t/o the session, O2 remained in the low 90s on room air most of the time with drop to 89% after prolonged (58ft) walk on room air.  He did not have any LOBs or overt unsteadiness but with both walker and cane (using hallway rail most of the time) he was guarded, reports being much slower than his baseline and did not have a lot of consistency with cadence.  Pt will benefit from continued PT to address functional limitations.        If plan is discharge home, recommend the following: A little help with walking and/or transfers;A little help with bathing/dressing/bathroom;Assistance with cooking/housework;Assist for transportation   Can travel by private vehicle   Yes    Equipment Recommendations Rolling walker (2 wheels)  Recommendations for Other Services       Functional Status Assessment Patient has had a recent decline in their functional status and demonstrates the ability to make significant improvements in function in a reasonable and predictable amount of time.     Precautions / Restrictions Precautions Precautions: Fall Restrictions Weight Bearing Restrictions Per Provider Order: No      Mobility  Bed  Mobility Overal bed mobility: Needs Assistance Bed Mobility: Supine to Sit     Supine to sit: Contact guard     General bed mobility comments: Pt was able to get himself to EOB, PT assisted with managing sheets/covers.  Using rails he was able to pull up to sitting EOB w/o need for direct assist.    Transfers Overall transfer level: Needs assistance Equipment used: Rolling walker (2 wheels), Straight cane Transfers: Sit to/from Stand Sit to Stand: Contact guard assist, Min assist           General transfer comment: Pt needed elevated bed and heavy UE use/forward lean to rise from bed.  In hallway needed UE on rail to pull up from when rising with SPC.  Pt clearly with somefunctional weakness in b/l quads    Ambulation/Gait Ambulation/Gait assistance: Contact guard assist, Min assist Gait Distance (Feet): 75 Feet Assistive device: Rolling walker (2 wheels), Straight cane         General Gait Details: Pt with slow and walker reliant gait, at ~75 ft needing to sit with subjective fatigue and SpO2 down to high 80s (on room air).  After seated rest break trial with just SPC (ostensibly his baseline) with decreased speed, increased unsteadiness and consistent need to use contralateral hand on rail, sat again at ~34ft.  Stairs            Wheelchair Mobility     Tilt Bed    Modified Rankin (Stroke Patients Only)       Balance Overall balance assessment: Needs assistance Sitting-balance support: No upper extremity  supported Sitting balance-Leahy Scale: Good Sitting balance - Comments: Pt was able to sit at EOB and don socks slowly, but w/o assist or LOBs while leaning over, twisting, etc   Standing balance support: Single extremity supported, Bilateral upper extremity supported Standing balance-Leahy Scale: Poor Standing balance comment: Pt with no overt LOBs during functional tasks using b/l UEs on RW but did not display great confidence.  Trial of standing/gait  with single UE support (cane) and despite PT being close and enouraging trial of baseline cane use he was consistently needing other hand on rail to stabilize                             Pertinent Vitals/Pain Pain Assessment Pain Assessment:  (reporting general pain from falls.  mostly indicates quads but variously pointing to neck, shoulders, knees, etc)    Home Living Family/patient expects to be discharged to:: Unsure                        Prior Function Prior Level of Function : Independent/Modified Independent             Mobility Comments: Pt reports that he is ou of the house every other day, drives and runs errands       Extremity/Trunk Assessment   Upper Extremity Assessment Upper Extremity Assessment: Overall WFL for tasks assessed;Generalized weakness    Lower Extremity Assessment Lower Extremity Assessment: Overall WFL for tasks assessed;Generalized weakness       Communication   Communication Communication: No apparent difficulties    Cognition Arousal: Alert Behavior During Therapy: Impulsive, WFL for tasks assessed/performed                           PT - Cognition Comments: Pt was able to follow simple cuing, maintain requested tasks (donning socks) but did not display full situational awareness Following commands: Intact       Cueing       General Comments General comments (skin integrity, edema, etc.): Pt reports frequent falls and feeling much slower and more limited than his baseline.    Exercises     Assessment/Plan    PT Assessment Patient needs continued PT services  PT Problem List Decreased strength;Decreased range of motion;Decreased activity tolerance;Decreased balance;Decreased mobility;Decreased knowledge of use of DME;Decreased safety awareness;Cardiopulmonary status limiting activity       PT Treatment Interventions DME instruction;Gait training;Functional mobility training;Therapeutic  activities;Therapeutic exercise;Balance training;Neuromuscular re-education;Cognitive remediation;Patient/family education    PT Goals (Current goals can be found in the Care Plan section)  Acute Rehab PT Goals Patient Stated Goal: Go home PT Goal Formulation: With patient Time For Goal Achievement: 07/23/23 Potential to Achieve Goals: Good    Frequency Min 1X/week     Co-evaluation               AM-PAC PT "6 Clicks" Mobility  Outcome Measure Help needed turning from your back to your side while in a flat bed without using bedrails?: None Help needed moving from lying on your back to sitting on the side of a flat bed without using bedrails?: None Help needed moving to and from a bed to a chair (including a wheelchair)?: A Little Help needed standing up from a chair using your arms (e.g., wheelchair or bedside chair)?: A Lot Help needed to walk in hospital room?: A Lot Help needed climbing 3-5 steps  with a railing? : A Lot 6 Click Score: 17    End of Session Equipment Utilized During Treatment: Gait belt Activity Tolerance: Patient tolerated treatment well Patient left: with chair alarm set;with call bell/phone within reach Nurse Communication: Mobility status PT Visit Diagnosis: Unsteadiness on feet (R26.81);Repeated falls (R29.6);Muscle weakness (generalized) (M62.81);Difficulty in walking, not elsewhere classified (R26.2)    Time: 1115-1140 PT Time Calculation (min) (ACUTE ONLY): 25 min   Charges:   PT Evaluation $PT Eval Low Complexity: 1 Low PT Treatments $Gait Training: 8-22 mins PT General Charges $$ ACUTE PT VISIT: 1 Visit         Malachi Pro, DPT 07/10/2023, 1:09 PM

## 2023-07-10 NOTE — Assessment & Plan Note (Addendum)
 Thrombocytopenia Hemoglobin 12 down from 14 just a couple weeks prior Platelet 76,000, down from 155 Etiology uncertain, possibly related to kidney disease, bone marrow suppression, hemolytic uremic syndrome, other -Will get anemia panel, peripheral smear, -SCD for DVT prophylaxis while platelets are in the 100,000

## 2023-07-10 NOTE — Assessment & Plan Note (Addendum)
 Not on specific AEDs Continue gabapentin 300 twice daily with monitoring of kidney function

## 2023-07-10 NOTE — Assessment & Plan Note (Signed)
 Given thrombocytopenia will get acute hepatitis panel

## 2023-07-11 ENCOUNTER — Encounter
Admission: EM | Disposition: A | Discharge status: Home Health | Payer: Self-pay | Source: Home / Self Care | Attending: Internal Medicine

## 2023-07-11 DIAGNOSIS — N179 Acute kidney failure, unspecified: Secondary | ICD-10-CM

## 2023-07-11 DIAGNOSIS — N189 Chronic kidney disease, unspecified: Secondary | ICD-10-CM

## 2023-07-11 DIAGNOSIS — R296 Repeated falls: Secondary | ICD-10-CM | POA: Diagnosis not present

## 2023-07-11 HISTORY — PX: TEMPORARY DIALYSIS CATHETER: CATH118312

## 2023-07-11 LAB — BASIC METABOLIC PANEL
Anion gap: 15 (ref 5–15)
BUN: 48 mg/dL — ABNORMAL HIGH (ref 8–23)
CO2: 19 mmol/L — ABNORMAL LOW (ref 22–32)
Calcium: 7.6 mg/dL — ABNORMAL LOW (ref 8.9–10.3)
Chloride: 96 mmol/L — ABNORMAL LOW (ref 98–111)
Creatinine, Ser: 9.76 mg/dL — ABNORMAL HIGH (ref 0.61–1.24)
GFR, Estimated: 5 mL/min — ABNORMAL LOW (ref >60)
Glucose, Bld: 91 mg/dL (ref 70–99)
Potassium: 3.4 mmol/L — ABNORMAL LOW (ref 3.5–5.1)
Sodium: 130 mmol/L — ABNORMAL LOW (ref 135–145)

## 2023-07-11 LAB — PHOSPHORUS: Phosphorus: 5.1 mg/dL — ABNORMAL HIGH (ref 2.5–4.6)

## 2023-07-11 LAB — GLUCOSE, CAPILLARY
Glucose-Capillary: 86 mg/dL (ref 70–99)
Glucose-Capillary: 100 mg/dL — ABNORMAL HIGH (ref 70–99)
Glucose-Capillary: 92 mg/dL (ref 70–99)
Glucose-Capillary: 99 mg/dL (ref 70–99)

## 2023-07-11 LAB — MAGNESIUM: Magnesium: 2 mg/dL (ref 1.7–2.4)

## 2023-07-11 LAB — HEPATITIS B SURFACE ANTIGEN: Hepatitis B Surface Ag: NONREACTIVE

## 2023-07-11 SURGERY — TEMPORARY DIALYSIS CATHETER
Anesthesia: Moderate Sedation

## 2023-07-11 MED ORDER — HEPARIN SODIUM (PORCINE) 5000 UNIT/ML IJ SOLN
5000.0000 [IU] | Freq: Three times a day (TID) | INTRAMUSCULAR | Status: DC
  Administered 2023-07-11 – 2023-07-22 (×32): 5000 [IU] via SUBCUTANEOUS
  Filled 2023-07-11 (×29): qty 1

## 2023-07-11 MED ORDER — CHLORHEXIDINE GLUCONATE CLOTH 2 % EX PADS
6.0000 | MEDICATED_PAD | Freq: Every day | CUTANEOUS | Status: DC
  Administered 2023-07-12 – 2023-07-21 (×7): 6 via TOPICAL

## 2023-07-11 MED ORDER — LIDOCAINE HCL (PF) 1 % IJ SOLN
INTRAMUSCULAR | Status: DC | PRN
  Administered 2023-07-11: 5 mL via INTRADERMAL

## 2023-07-11 SURGICAL SUPPLY — 8 items
BIOPATCH RED 1 DISK 7.0 (GAUZE/BANDAGES/DRESSINGS) IMPLANT
COVER PROBE ULTRASOUND 5X96 (MISCELLANEOUS) IMPLANT
GOWN STRL REUS W/ TWL LRG LVL3 (GOWN DISPOSABLE) ×2 IMPLANT
KIT DIALYSIS CATH TRI 30X13 (CATHETERS) IMPLANT
NDL ENTRY 21GA 7CM ECHOTIP (NEEDLE) IMPLANT
NEEDLE ENTRY 21GA 7CM ECHOTIP (NEEDLE) ×1 IMPLANT
PACK ANGIOGRAPHY (CUSTOM PROCEDURE TRAY) ×2 IMPLANT
SET INTRO CAPELLA COAXIAL (SET/KITS/TRAYS/PACK) IMPLANT

## 2023-07-11 NOTE — Progress Notes (Signed)
 Hospital Consult    Reason for Consult:  Temporary Dialysis Access Requesting Physician:  Malachi Carl NP  MRN #:  [id number]  History of Present Illness: This is a 73 y.o. 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  He feels like his knees just gives out and he falls.  He hit his head on 1 occasion but did not lose consciousness.   Upon work up for generalized weakness the patient was noted to have elevated of 8029 up from 1.2 and elevated BUN up to 38. Today's Bun + 48 and Creatinine up to 9.76. Nephrology recommends starting hemodialysis. Vascular surgery consulted to place temporary dialysis access.   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    COPD (chronic obstructive pulmonary disease) (HCC)    Depression 08/19/2017   Hepatitis C    Hypertension    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HCC)    Seizures (HCC) 2010   Sleep apnea    Syncope    questionable vasovagal    Past Surgical History:  Procedure Laterality Date   COLONOSCOPY WITH PROPOFOL N/A 12/16/2016   Procedure: COLONOSCOPY WITH PROPOFOL;  Surgeon: Christena Deem, MD;  Location: [facility] ENDOSCOPY;  Service: Endoscopy;  Laterality: N/A;   COLONOSCOPY WITH PROPOFOL N/A 03/14/2017   Procedure: COLONOSCOPY WITH PROPOFOL;  Surgeon: Christena Deem, MD;  Location: [facility] ENDOSCOPY;  Service: Endoscopy;  Laterality: N/A;   COLONOSCOPY WITH PROPOFOL     FOOT SURGERY Right 03/12/2002   FOOT SURGERY     HEMORRHOID SURGERY     LITHOTRIPSY     MYRINGOTOMY     MYRINGOTOMY WITH TUBE PLACEMENT Left 04/28/2019    Procedure: MYRINGOTOMY WITH TUBE PLACEMENT;  Surgeon: Bud Face, MD;  Location: [facility]ERY [facility];  Service: ENT;  Laterality: Left;   NASOPHARYNGOSCOPY N/A 04/28/2019   Procedure: NASOPHARYNGOSCOPY WITH BIOPSY;  Surgeon: Bud Face, MD;  Location: [facility] SURGERY [facility];  Service: ENT;  Laterality: N/A;   NASOPHARYNGOSCOPY     Port a cath Placement     PORTA CATH INSERTION N/A 05/20/2019   Procedure: PORTA CATH INSERTION;  Surgeon: Annice Needy, MD;  Location: [facility] INVASIVE CV LAB;  Service: Cardiovascular;  Laterality: N/A;   PORTA CATH REMOVAL N/A 11/29/2020   Procedure: PORTA CATH REMOVAL;  Surgeon: Annice Needy, MD;  Location: [facility] INVASIVE CV LAB;  Service: Cardiovascular;  Laterality: N/A;   TONSILLECTOMY      Allergies  Allergen Reactions   Opana [Oxymorphone Hcl]     Made him BLACKOUT   Elemental Sulfur     Childhood reaction    Sulfa Antibiotics Other (See Comments)   Sulfa Antibiotics     Prior to Admission medications   Medication Sig Start Date End Date Taking? Authorizing Provider  acetaminophen (TYLENOL) 500 MG tablet Take 2 tablets (1,000 mg total) by mouth every 6 (six) hours as needed for mild pain (pain score 1-3). 04/17/23   Henrene Dodge, MD  amitriptyline (ELAVIL) 75 MG tablet Take 1 tablet (75 mg total) by mouth at bedtime. 06/25/23   Sallee Provencal, FNP  amLODipine (NORVASC)  10 MG tablet Take 1 tablet (10 mg total) by mouth daily. 01/24/23   [name], FNP  antiseptic oral rinse (BIOTENE) LIQD 15 mLs by Mouth Rinse route as needed for dry mouth.    [provider]  aspirin EC 81 MG tablet Take 81 mg by mouth daily. Swallow whole.    [provider]  Blood Glucose Monitoring Suppl DEVI 1 each by Does not apply route in the morning, at noon, and at bedtime. May substitute to any manufacturer covered by patient's insurance. Pt's current glucometer is broken. 10/28/22   Alfredia Ferguson, PA-C  gabapentin (NEURONTIN) 300 MG capsule Take  1 capsule (300 mg total) by mouth 2 (two) times daily for 15 days. 04/17/23 05/02/23  Henrene Dodge, MD  ibuprofen (ADVIL) 600 MG tablet Take 1 tablet (600 mg total) by mouth every 8 (eight) hours as needed for moderate pain (pain score 4-6). 04/17/23   Henrene Dodge, MD  lisinopril (ZESTRIL) 10 MG tablet Take 1 tablet (10 mg total) by mouth daily. 06/25/23   Sallee Provencal, FNP  nystatin (MYCOSTATIN) 100000 UNIT/ML suspension Take 5 mLs by mouth daily as needed (thrush). 02/06/23   [provider]  omeprazole (PRILOSEC) 40 MG capsule TAKE 1 CAPSULE BY MOUTH ONCE DAILY 10/23/22   Ok Edwards, Lillia Abed, PA-C  rosuvastatin (CRESTOR) 40 MG tablet Take 1 tablet (40 mg total) by mouth daily. 01/24/23   [name], FNP    Social History   Socioeconomic History   Marital status: Divorced    Spouse name: Not on file   Number of children: Not on file   Years of education: Not on file   Highest education level: Not on file  Occupational History   Not on file  Tobacco Use   Smoking status: Former    Current packs/day: 0.00    Average packs/day: 0.5 packs/day for 50.0 years (25.0 ttl pk-yrs)    Types: Cigarettes    Start date: 04/04/1972    Quit date: 04/04/2022    Years since quitting: 1.2    Passive exposure: Past   Smokeless tobacco: Never  Vaping Use   Vaping status: Never Used  Substance and Sexual Activity   Alcohol use: Not Currently    Alcohol/week: 14.0 standard drinks of alcohol    Types: 14 Cans of beer per week   Drug use: Not Currently   Sexual activity: Not Currently  Other Topics Concern   Not on file  Social History Narrative   ** Merged History Encounter **       Social Drivers of Health   Financial Resource Strain: Low Risk  (10/22/2022)   Overall Financial Resource Strain (CARDIA)    Difficulty of Paying Living Expenses: Not hard at all  Food Insecurity: No Food Insecurity (07/10/2023)   Hunger Vital Sign    Worried About Running Out of Food in the Last Year:  Never true    Ran Out of Food in the Last Year: Never true  Transportation Needs: No Transportation Needs (07/10/2023)   PRAPARE - Administrator, Civil Service (Medical): No    Lack of Transportation (Non-Medical): No  Physical Activity: Inactive (10/22/2022)   Exercise Vital Sign    Days of Exercise per Week: 0 days    Minutes of Exercise per Session: 0 min  Stress: Stress Concern Present (01/29/2023)   [employer] of Occupational Health - Occupational Stress Questionnaire    Feeling of Stress : To some extent  Social  Connections: Socially Isolated (07/10/2023)   Social Connection and Isolation Panel [NHANES]    Frequency of Communication with Friends and Family: Three times a week    Frequency of Social Gatherings with Friends and Family: Once a week    Attends Religious Services: Never    Database administrator or Organizations: No    Attends Banker Meetings: Never    Marital Status: Divorced  Catering manager Violence: Not At Risk (07/10/2023)   Humiliation, Afraid, Rape, and Kick questionnaire    Fear of Current or Ex-Partner: No    Emotionally Abused: No    Physically Abused: No    Sexually Abused: No     Family History  Problem Relation Age of Onset   Arthritis Mother    Hypertension Mother    Cancer Father    Kidney disease Father    Alcohol abuse Paternal Aunt    Depression Maternal Grandfather     ROS: Otherwise negative unless mentioned in HPI  Physical Examination  Vitals:   07/11/23 0752 07/11/23 1150  BP: 133/60 (!) 103/54  Pulse: 80 96  Resp: 19 20  Temp: 98 F (36.7 C) 98.4 F (36.9 C)  SpO2: 98%    Body mass index is 30.6 kg/m.  General:  WDWN in NAD Gait: Not observed HENT: WNL, normocephalic Pulmonary: normal non-labored breathing, without Rales, rhonchi,  wheezing Cardiac: regular, without  Murmurs, rubs or gallops; without carotid bruits Abdomen: Positive bowel sounds throughout, soft, NT/ND, no  masses Skin: without rashes Vascular Exam/Pulses: Palpable pulses throughout.  Extremities: without ischemic changes, without Gangrene , without cellulitis; without open wounds;  Musculoskeletal: no muscle wasting or atrophy  Neurologic: A&O X 3;  No focal weakness or paresthesias are detected; speech is fluent/normal. Baseline Dementia Psychiatric:  The pt has Normal affect. Lymph:  Unremarkable  CBC    Component Value Date/Time   WBC 6.0 07/10/2023 0155   RBC 3.68 (L) 07/10/2023 0155   RBC 3.68 (L) 07/10/2023 0155   HGB 12.0 (L) 07/10/2023 0155   HGB 14.3 06/25/2023 1136   HCT 35.1 (L) 07/10/2023 0155   HCT 42.8 06/25/2023 1136   PLT 68 (L) 07/10/2023 0155   PLT 155 06/25/2023 1136   MCV 95.4 07/10/2023 0155   MCV 97 06/25/2023 1136   MCV 94 12/31/2013 1207   MCH 32.6 07/10/2023 0155   MCHC 34.2 07/10/2023 0155   RDW 16.1 (H) 07/10/2023 0155   RDW 15.8 (H) 06/25/2023 1136   RDW 14.3 12/31/2013 1207   LYMPHSABS 0.7 07/09/2023 1916   LYMPHSABS 1.3 01/24/2023 1435   LYMPHSABS 2.8 06/08/2013 0423   MONOABS 0.4 07/09/2023 1916   MONOABS 0.9 06/08/2013 0423   EOSABS 0.3 07/09/2023 1916   EOSABS 0.2 01/24/2023 1435   EOSABS 0.1 06/08/2013 0423   BASOSABS 0.0 07/09/2023 1916   BASOSABS 0.0 01/24/2023 1435   BASOSABS 0.0 06/08/2013 0423    BMET    Component Value Date/Time   NA 130 (L) 07/11/2023 0438   NA 140 06/25/2023 1136   NA 139 12/31/2013 1207   K 3.4 (L) 07/11/2023 0438   K 3.8 12/31/2013 1207   CL 96 (L) 07/11/2023 0438   CL 101 12/31/2013 1207   CO2 19 (L) 07/11/2023 0438   CO2 28 12/31/2013 1207   GLUCOSE 91 07/11/2023 0438   GLUCOSE 134 (H) 12/31/2013 1207   BUN 48 (H) 07/11/2023 0438   BUN 16 06/25/2023 1136   BUN 8 12/31/2013 1207  CREATININE 9.76 (H) 07/11/2023 0438   CREATININE 1.03 12/31/2013 1207   CALCIUM 7.6 (L) 07/11/2023 0438   CALCIUM 8.8 12/31/2013 1207   GFRNONAA 5 (L) 07/11/2023 0438   GFRNONAA >60 12/31/2013 1207   GFRAA >60  12/11/2019 0531   GFRAA >60 12/31/2013 1207    COAGS: Lab Results  Component Value Date   INR 1.1 07/09/2023   INR 1.0 09/25/2022   INR 1.2 05/04/2022     Non-Invasive Vascular Imaging:   EXAM:07/09/23 CT HEAD WITHOUT CONTRAST   TECHNIQUE: Contiguous axial images were obtained from the base of the skull through the vertex without intravenous contrast.   RADIATION DOSE REDUCTION: This exam was performed according to the departmental dose-optimization program which includes automated exposure control, adjustment of the mA and/or kV according to patient size and/or use of iterative reconstruction technique.   COMPARISON:  09/25/2022   FINDINGS: Brain: No acute infarct or hemorrhage. Lateral ventricles and midline structures are unremarkable. No acute extra-axial fluid collections. No mass effect.   Vascular: No hyperdense vessel or unexpected calcification.   Skull: Normal. Negative for fracture or focal lesion.   Sinuses/Orbits: No acute finding.   Other: None.   IMPRESSION: 1. No acute intracranial process.    Statin:  Yes.   Beta Blocker:  No. Aspirin:  Yes.   ACEI:  Yes.   ARB:  No. CCB use:  Yes Other antiplatelets/anticoagulants:  No.    ASSESSMENT/PLAN: This is a 73 y.o. male who presents to [facility] emergency department with generalized weakness and multiple falls over the past few days.  Patient endorses that his knees just give out.  He denies hitting his head with any loss of consciousness.  Upon workup he was noted to have an elevated BUN and creatinine and an acute kidney failure.  Vascular surgery was consulted for temporary dialysis catheter placement.  Vascular surgery plans on placing a temporary dialysis catheter in the vascular lab later today on 07/11/2023.  I discussed with the patient in detail the procedure, benefits, risk, and complications.  Patient verbalizes understanding and wishes to proceed to soon as possible.  I answered all the  patient's questions this afternoon.  Patient has not required to be n.p.o. at this time.  He endorses he did eat breakfast.   -I discussed the case in detail with Dr. Levora Dredge MD and he agrees with the plan   Marcie Bal [facility] 07/11/2023 12:20 PM

## 2023-07-11 NOTE — Plan of Care (Signed)

## 2023-07-11 NOTE — Discharge Instructions (Signed)

## 2023-07-11 NOTE — TOC Initial Note (Signed)
 Transition of Care [facility]) - Initial/Assessment Note    Patient Details  Name: [name] MRN: [id number] Date of Birth: March 24, 1951  [facility]) CM/SW Contact:    Chapman Fitch, RN Phone Number: 07/11/2023, 12:17 PM  Clinical Narrative:                    Met with patient at bedside.  His friend Mac was present. Patient gave permission for me to speak with him while Mac was present  Admitted for: AKI Admitted from: home alone PCP: Ephriam Knuckles Current home health/prior home health/DME: NA  Therapy recommending SNF Patient in agreement PASRR pending Bed search initiated  SA resources added to AVS      Patient Goals and CMS Choice            Expected Discharge Plan and Services                                              Prior Living Arrangements/Services                       Activities of Daily Living   ADL Screening (condition at time of admission) Independently performs ADLs?: Yes (appropriate for developmental age) Is the patient deaf or have difficulty hearing?: No Does the patient have difficulty seeing, even when wearing glasses/contacts?: No  Permission Sought/Granted                  Emotional Assessment              Admission diagnosis:  Thrombocytopenia (HCC) [D69.6] AKI (acute kidney injury) (HCC) [N17.9] Frequent falls [R29.6] Increased anion gap metabolic acidosis [E87.29] Injury of head, initial encounter [S09.90XA] Patient Active Problem List   Diagnosis Date Noted   Anemia 07/10/2023   High anion gap metabolic acidosis 07/10/2023   Frequent falls 07/10/2023   Alcohol use disorder 07/10/2023   AKI (acute kidney injury) (HCC) 07/09/2023   Diabetic polyneuropathy associated with type 2 diabetes mellitus (HCC) 06/25/2023   History of calculus of gallbladder 06/25/2023   Dementia without behavioral disturbance (HCC) 02/16/2023   Mood disorder (HCC) 01/26/2023   Adjustment  insomnia 01/26/2023   Infestation by bed bug 01/26/2023   Complex regional pain syndrome type 2 of right upper extremity 09/12/2022   Type 2 diabetes mellitus without complication, without long-term current use of insulin (HCC) 06/28/2022   Chronic respiratory failure with hypoxia (HCC) 05/04/2022   Thrombocytopenia (HCC)    Hypertension associated with diabetes (HCC)    Chemotherapy induced neutropenia (HCC) 07/20/2019   COPD (chronic obstructive pulmonary disease) (HCC) 06/03/2019   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   Vitamin B12 deficiency 10/24/2018   Hyperlipidemia associated with type 2 diabetes mellitus (HCC) 03/03/2018   History of hepatitis C 08/19/2017   Gastroesophageal reflux disease without esophagitis 08/19/2017   Complex regional pain syndrome of both lower extremities 01/05/2015   Seizure disorder (HCC) 11/29/2013   Essential hypertension 11/29/2013   Tobacco use disorder 11/29/2013   PCP:  Sallee Provencal, FNP Pharmacy:   [facility] - [place], [place] - [address] [address] Phone: [phone] Fax: [phone]  [facility] - [place], [place] - [address] [facility] [employer] [address] [place] [address] Phone: [phone]  Fax: [phone]     Social Drivers of Health (SDOH) Social History: SDOH Screenings   Food Insecurity: No Food Insecurity (07/10/2023)  Housing: Low Risk  (07/10/2023)  Transportation Needs: No Transportation Needs (07/10/2023)  Utilities: Not At Risk (07/10/2023)  Alcohol Screen: Low Risk  (10/28/2022)  Depression (PHQ2-9): Low Risk  (06/25/2023)  Financial Resource Strain: Low Risk  (10/22/2022)  Physical Activity: Inactive (10/22/2022)  Social Connections: Socially Isolated (07/10/2023)  Stress: Stress Concern Present (01/29/2023)  Tobacco Use: Medium Risk (07/09/2023)   SDOH Interventions:     Readmission Risk Interventions     No data to  display

## 2023-07-11 NOTE — Progress Notes (Signed)
  PROGRESS NOTE    [name]  WUJ:[id number] DOB: 09-08-1950 DOA: 07/09/2023 PCP: Sallee Provencal, FNP  206A/206A-AA  LOS: 2 days   Brief hospital course:   Assessment & Plan: [name] is a 73 y.o. 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    * AKI (acute kidney injury) (HCC) High anion gap metabolic acidosis Suspect secondary to ibuprofen use, possibly recent lisinopril.  Oliguric.   --nephrology consulted Plan: --cont MIVF per nephro --place temp dialysis cath  Anemia Thrombocytopenia Hemoglobin 12 down from 14 just a couple weeks prior Platelet 76,000, down from 155 Etiology uncertain, possibly related to kidney disease, bone marrow suppression, hemolytic uremic syndrome, other --monitor  Frequent falls Generalized weakness Vitamin B12 deficiency  --PT/OT rec SNF rehab  diabetic polyneuropathy Complex regional pain syndrome of both lower extremities --taper down home gabapentin due to kidney fxn decline  COPD (chronic obstructive pulmonary disease) (HCC) Chronic respiratory failure with hypoxia Not acutely exacerbated  Alcohol use disorder Thiamine folate and multivitamin --CIWA  Dementia without behavioral disturbance (HCC) Delirium precautions  Type 2 diabetes mellitus without complication, without long-term current use of insulin (HCC) --d/c BG checks and SSI  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  Followed by oncology and ENT  History of hepatitis C --hep C RNA  Tobacco use disorder Nicotine patch  Essential hypertension Blood pressure on  the soft side so hold lisinopril and amlodipine to resume as appropriate  Seizure disorder (HCC) Not on specific AEDs   DVT prophylaxis: Heparin SQ Code Status: DNR  Family Communication:  Level of care: Med-Surg Dispo:   The patient is from: home Anticipated d/c is to: SNF rehab Anticipated d/c date is: > 3 days   Subjective and Interval History:  Pt received temp dialysis cath today.  Reported some urine output today.   Objective: Vitals:   07/11/23 1423 07/11/23 1610 07/11/23 1625 07/11/23 1658  BP: 110/62 109/78 (!) 121/56 112/66  Pulse: 76 76 70 74  Resp: 15 16 16    Temp: 98.5 F (36.9 C)   98 F (36.7 C)  TempSrc: Oral   Oral  SpO2: 96% 95% 96% 96%  Weight:      Height:        Intake/Output Summary (Last 24 hours) at 07/11/2023 2032 Last data filed at 07/11/2023 1054 Gross per 24 hour  Intake 772.09 ml  Output --  Net 772.09 ml   Filed Weights   07/09/23 1912 07/10/23 0112 07/10/23 0352  Weight: 90.7 kg 93.1 kg 94 kg    Examination:   Constitutional: NAD, alert, oriented to person and place HEENT: conjunctivae and lids normal, EOMI CV: No cyanosis.   RESP: normal respiratory effort, on RA Neuro: II - XII grossly intact.     Data Reviewed: I have personally reviewed labs and imaging studies  Time spent: 50 minutes  Darlin Priestly, MD [facility] If 7PM-7AM, please contact night-coverage 07/11/2023, 8:32 PM

## 2023-07-11 NOTE — Op Note (Signed)
  OPERATIVE NOTE   PROCEDURE: Insertion of temporary dialysis catheter catheter right common femoral approach.  PRE-OPERATIVE DIAGNOSIS: Acute on chronic renal insufficiency requiring hemodialysis  POST-OPERATIVE DIAGNOSIS: Same  SURGEON: Renford Dills M.D.  ANESTHESIA: 1% lidocaine local infiltration  ESTIMATED BLOOD LOSS: Minimal cc  INDICATIONS:   [name] IV is a 73 y.o. male who presents with worsening of his renal function.  He now requires hemodialysis and therefore a temporary catheter is being recommended.  Risks and benefits have been reviewed all questions answered patient agrees to proceed.  DESCRIPTION: After obtaining full informed written consent, the patient was positioned supine. The right groin was prepped and draped in a sterile fashion. Ultrasound was placed in a sterile sleeve. Ultrasound was utilized to identify the right common femoral vein which is noted to be echolucent and compressible indicating patency. Images recorded for the permanent record. Under real-time visualization a Seldinger needle is inserted into the vein and the guidewires advanced without difficulty. Small counterincision was made at the wire insertion site. Dilator is passed over the wire and the temporary dialysis catheter catheter is fed over the wire without difficulty.  All lumens aspirate and flush easily and are packed with heparin saline. Catheter secured to the skin of the right thigh with 2-0 silk. A sterile dressing is applied with Biopatch.  COMPLICATIONS: None  CONDITION: Unchanged  Levora Dredge Office:  [phone] 07/11/2023, 4:11 PM

## 2023-07-11 NOTE — Progress Notes (Signed)
 [facility]  ROUNDING NOTE   Subjective:   Patient seen resting in bed Completed breakfast tray at bedside Alert and oriented Denies shortness of breath, room air No lower extremity edema Patient reports no urine output since yesterday afternoon  Creatinine 9.78 from 8.5 to  Patient seen later in morning with some confusion  Objective:  Vital signs in last 24 hours:  Temp:  [97.5 F (36.4 C)-98.7 F (37.1 C)] 98.4 F (36.9 C) (02/28 1150) Pulse Rate:  [72-96] 96 (02/28 1150) Resp:  [18-20] 20 (02/28 1150) BP: (103-133)/(52-118) 103/54 (02/28 1150) SpO2:  [92 %-100 %] 98 % (02/28 0752)  Weight change:  Filed Weights   07/09/23 1912 07/10/23 0112 07/10/23 0352  Weight: 90.7 kg 93.1 kg 94 kg    Intake/Output: I/O last 3 completed shifts: In: 3123.5 [P.O.:720; I.V.:1403.5; IV Piggyback:1000] Out: 0    Intake/Output this shift:  Total I/O In: 120 [P.O.:120] Out: -   Physical Exam: General: NAD  Head: Normocephalic, atraumatic. Moist oral mucosal membranes  Eyes: Anicteric  Lungs:  Clear to auscultation, normal effort  Heart: Regular rate and rhythm  Abdomen:  Soft, nontender, distention  Extremities: No peripheral edema.  Neurologic: Alert, moving all four extremities  Skin: No lesions  Access: None    Basic Metabolic Panel: Recent Labs  Lab 07/09/23 1916 07/10/23 0155 07/11/23 0438  NA 131* 128* 130*  K 3.7 3.4* 3.4*  CL 95* 94* 96*  CO2 18* 18* 19*  GLUCOSE 116* 94 91  BUN 38* 40* 48*  CREATININE 8.29* 8.52* 9.76*  CALCIUM 8.1* 7.6* 7.6*  MG  --   --  2.0  PHOS  --   --  5.1*    Liver Function Tests: Recent Labs  Lab 07/09/23 1916  AST 48*  ALT 20  ALKPHOS 64  BILITOT 1.1  PROT 7.2  ALBUMIN 3.8   No results for input(s): "LIPASE", "AMYLASE" in the last 168 hours. No results for input(s): "AMMONIA" in the last 168 hours.  CBC: Recent Labs  Lab 07/09/23 1916 07/10/23 0155  WBC 6.9  6.8 6.0  NEUTROABS 5.5  --    HGB 12.4*  12.5* 12.0*  HCT 36.7*  36.9* 35.1*  MCV 94.6  96.3 95.4  PLT 79*  76* 68*    Cardiac Enzymes: No results for input(s): "CKTOTAL", "CKMB", "CKMBINDEX", "TROPONINI" in the last 168 hours.  BNP: Invalid input(s): "POCBNP"  CBG: Recent Labs  Lab 07/10/23 1137 07/10/23 1709 07/10/23 2114 07/11/23 0745 07/11/23 1115  GLUCAP 128* 103* 86 86 100*    Microbiology: Results for orders placed or performed during the hospital encounter of 02/15/23  Resp panel by RT-PCR (RSV, Flu A&B, Covid) Anterior Nasal Swab     Status: None   Collection Time: 02/15/23  3:41 PM   Specimen: Anterior Nasal Swab  Result Value Ref Range Status   SARS Coronavirus 2 by RT PCR NEGATIVE NEGATIVE Final    Comment: (NOTE) SARS-CoV-2 target nucleic acids are NOT DETECTED.  The SARS-CoV-2 RNA is generally detectable in upper respiratory specimens during the acute phase of infection. The lowest concentration of SARS-CoV-2 viral copies this assay can detect is 138 copies/mL. A negative result does not preclude SARS-Cov-2 infection and should not be used as the sole basis for treatment or other patient management decisions. A negative result may occur with  improper specimen collection/handling, submission of specimen other than nasopharyngeal swab, presence of viral mutation(s) within the areas targeted by this assay, and inadequate  number of viral copies(<138 copies/mL). A negative result must be combined with clinical observations, patient history, and epidemiological information. The expected result is Negative.  Fact Sheet for Patients:  BloggerCourse.com  Fact Sheet for Healthcare Providers:  SeriousBroker.it  This test is no t yet approved or cleared by the [place] FDA and  has been authorized for detection and/or diagnosis of SARS-CoV-2 by FDA under an Emergency Use Authorization (EUA). This EUA will remain  in effect  (meaning this test can be used) for the duration of the COVID-19 declaration under Section 564(b)(1) of the Act, 21 U.S.C.section 360bbb-3(b)(1), unless the authorization is terminated  or revoked sooner.       Influenza A by PCR NEGATIVE NEGATIVE Final   Influenza B by PCR NEGATIVE NEGATIVE Final    Comment: (NOTE) The Xpert Xpress SARS-CoV-2/FLU/RSV plus assay is intended as an aid in the diagnosis of influenza from Nasopharyngeal swab specimens and should not be used as a sole basis for treatment. Nasal washings and aspirates are unacceptable for Xpert Xpress SARS-CoV-2/FLU/RSV testing.  Fact Sheet for Patients: BloggerCourse.com  Fact Sheet for Healthcare Providers: SeriousBroker.it  This test is not yet approved or cleared by the [place] FDA and has been authorized for detection and/or diagnosis of SARS-CoV-2 by FDA under an Emergency Use Authorization (EUA). This EUA will remain in effect (meaning this test can be used) for the duration of the COVID-19 declaration under Section 564(b)(1) of the Act, 21 U.S.C. section 360bbb-3(b)(1), unless the authorization is terminated or revoked.     Resp Syncytial Virus by PCR NEGATIVE NEGATIVE Final    Comment: (NOTE) Fact Sheet for Patients: BloggerCourse.com  Fact Sheet for Healthcare Providers: SeriousBroker.it  This test is not yet approved or cleared by the [place] FDA and has been authorized for detection and/or diagnosis of SARS-CoV-2 by FDA under an Emergency Use Authorization (EUA). This EUA will remain in effect (meaning this test can be used) for the duration of the COVID-19 declaration under Section 564(b)(1) of the Act, 21 U.S.C. section 360bbb-3(b)(1), unless the authorization is terminated or revoked.  Performed at [facility], [address] [place], [place] [address]   Blood Culture  (routine x 2)     Status: None   Collection Time: 02/15/23  7:44 PM   Specimen: Right Antecubital; Blood  Result Value Ref Range Status   Specimen Description RIGHT ANTECUBITAL  Final   Special Requests   Final    BOTTLES DRAWN AEROBIC AND ANAEROBIC Blood Culture adequate volume   Culture   Final    NO GROWTH 5 DAYS Performed at [facility], [address] [place], [place] [address]    Report Status 02/20/2023 FINAL  Final  Blood Culture (routine x 2)     Status: None   Collection Time: 02/16/23  4:17 PM   Specimen: BLOOD  Result Value Ref Range Status   Specimen Description BLOOD BLOOD RIGHT ARM  Final   Special Requests   Final    BOTTLES DRAWN AEROBIC AND ANAEROBIC Blood Culture adequate volume   Culture   Final    NO GROWTH 5 DAYS Performed at [facility], [address] [place], [place] [address]    Report Status 02/21/2023 FINAL  Final    Coagulation Studies: Recent Labs    07/09/23 2340  LABPROT 14.2  INR 1.1    Urinalysis: No results for input(s): "COLORURINE", "LABSPEC", "PHURINE", "GLUCOSEU", "HGBUR", "BILIRUBINUR", "KETONESUR", "PROTEINUR", "UROBILINOGEN", "NITRITE", "LEUKOCYTESUR" in the last  72 hours.  Invalid input(s): "APPERANCEUR"    Imaging: US RENAL Result Date: 07/10/2023 CLINICAL DATA:  [id number] AKI (acute kidney injury) (HCC) [id number] EXAM: RENAL / URINARY TRACT ULTRASOUND COMPLETE COMPARISON:  None Available. FINDINGS: Right Kidney: Renal measurements: 10.3 x 5.4 x 5.3 cm = volume: 155 mL. Echogenicity within normal limits. No mass or hydronephrosis visualized. Left Kidney: Renal measurements: 11.3 x 6.8 x 4.3 cm = volume: 172 mL mL. Echogenicity within normal limits. No mass or hydronephrosis visualized. Bladder: Appears normal for degree of bladder distention. Other: None. IMPRESSION: No hydronephrosis or nephrolithiasis. Electronically Signed   By: Lorenza Cambridge M.D.   On: 07/10/2023 12:20   CT Cervical Spine Wo Contrast Result  Date: 07/09/2023 CLINICAL DATA:  Larey Seat, hit head EXAM: CT CERVICAL SPINE WITHOUT CONTRAST TECHNIQUE: Multidetector CT imaging of the cervical spine was performed without intravenous contrast. Multiplanar CT image reconstructions were also generated. RADIATION DOSE REDUCTION: This exam was performed according to the departmental dose-optimization program which includes automated exposure control, adjustment of the mA and/or kV according to patient size and/or use of iterative reconstruction technique. COMPARISON:  11/03/2019 FINDINGS: Alignment: Straightening of the cervical spine is likely positional. Otherwise alignment is anatomic. Skull base and vertebrae: No acute fracture. No primary bone lesion or focal pathologic process. Soft tissues and spinal canal: No prevertebral fluid or swelling. No visible canal hematoma. Disc levels: There is progressive multilevel spondylosis most pronounced at the C5-6 and C6-7 levels. Mild diffuse facet hypertrophy. Upper chest: Airway is patent.  Lung apices are clear. Other: Reconstructed images demonstrate no additional findings. IMPRESSION: 1. No acute cervical spine fracture. 2. Progressive multilevel cervical spondylosis, greatest at C5-6 and C6-7. Electronically Signed   By: Sharlet Salina M.D.   On: 07/09/2023 20:08   CT HEAD WO CONTRAST ( ) Result Date: 07/09/2023 CLINICAL DATA:  Multiple falls, hit head EXAM: CT HEAD WITHOUT CONTRAST TECHNIQUE: Contiguous axial images were obtained from the base of the skull through the vertex without intravenous contrast. RADIATION DOSE REDUCTION: This exam was performed according to the departmental dose-optimization program which includes automated exposure control, adjustment of the mA and/or kV according to patient size and/or use of iterative reconstruction technique. COMPARISON:  09/25/2022 FINDINGS: Brain: No acute infarct or hemorrhage. Lateral ventricles and midline structures are unremarkable. No acute extra-axial fluid  collections. No mass effect. Vascular: No hyperdense vessel or unexpected calcification. Skull: Normal. Negative for fracture or focal lesion. Sinuses/Orbits: No acute finding. Other: None. IMPRESSION: 1. No acute intracranial process. Electronically Signed   By: Sharlet Salina M.D.   On: 07/09/2023 20:06   DG Chest 2 View Result Date: 07/09/2023 CLINICAL DATA:  Multiple falls EXAM: CHEST - 2 VIEW COMPARISON:  02/15/2023 FINDINGS: Frontal and lateral views of the chest demonstrate an unremarkable cardiac silhouette. No acute airspace disease, effusion, or pneumothorax. No acute bony abnormalities. IMPRESSION: 1. No acute intrathoracic process. Electronically Signed   By: Sharlet Salina M.D.   On: 07/09/2023 20:04     Medications:    sodium chloride 75 mL/hr at 07/11/23 0509    amitriptyline  75 mg Oral QHS   aspirin EC  81 mg Oral Daily   folic acid  1 mg Oral Daily   gabapentin  300 mg Oral Daily   Followed by   Melene Muller ON 07/13/2023] gabapentin  200 mg Oral Q1200   insulin aspart  0-15 Units Subcutaneous TID WC   insulin aspart  0-5 Units Subcutaneous QHS   multivitamin with minerals  1 tablet Oral Daily   nicotine  14 mg Transdermal Daily   rosuvastatin  40 mg Oral Daily   thiamine  100 mg Oral Daily   acetaminophen **OR** acetaminophen, albuterol, HYDROcodone-acetaminophen, LORazepam **OR** LORazepam, ondansetron **OR** ondansetron (ZOFRAN) IV, mouth rinse  Assessment/ Plan:  [name] IV is a 73 y.o.  male with past medical conditions including alcohol use disorder, COPD with 2 L nasal cannula, diet-controlled diabetes, hepatitis C, seizure disorder, hypertension, and vitamin B12 deficiency, who was admitted to [facility] on 07/09/2023 for Thrombocytopenia (HCC) [D69.6] AKI (acute kidney injury) (HCC) [N17.9] Frequent falls [R29.6] Increased anion gap metabolic acidosis [E87.29] Injury of head, initial encounter [S09.90XA]   Acute kidney injury likely secondary to  dehydration with NSAID use. Normal renal function noted 2 weeks prior. Renal ultrasound negative for hydronephrosis. No recent contrast exposure. Continue holding ibuprofen and lisinopril.  Renal function has not improved despite IV hydration.  No urine output recorded today.  Patient agreeable to proceed with temporary dialysis.  Vascular surgery consulted for placement of HD temp cath.  Will initiate dialysis once placed.  Hepatitis B panel ordered.  Lab Results  Component Value Date   CREATININE 9.76 (H) 07/11/2023   CREATININE 8.52 (H) 07/10/2023   CREATININE 8.29 (H) 07/09/2023    Intake/Output Summary (Last 24 hours) at 07/11/2023 1253 Last data filed at 07/11/2023 1054 Gross per 24 hour  Intake 1351.46 ml  Output --  Net 1351.46 ml   2.  Hypertension, essential.  Home regimen includes amlodipine and lisinopril.  Both currently held.   Blood pressure soft this morning, 103/54.   3. Diabetes mellitus type II, renal manifestations: noninsulin dependent. Most recent hemoglobin A1c is 6.1 on 06/25/23.  Currently diet controlled  4. Anemia with thrombocytopenia Lab Results  Component Value Date   HGB 12.0 (L) 07/10/2023    Hemoglobin within desired range.  No updated labs today.   LOS: 2 Roxann Vierra 2/28/202512:53 PM

## 2023-07-12 DIAGNOSIS — N179 Acute kidney failure, unspecified: Secondary | ICD-10-CM | POA: Diagnosis not present

## 2023-07-12 LAB — CBC
HCT: 33.8 % — ABNORMAL LOW (ref 39.0–52.0)
Hemoglobin: 11.3 g/dL — ABNORMAL LOW (ref 13.0–17.0)
MCH: 31.6 pg (ref 26.0–34.0)
MCHC: 33.4 g/dL (ref 30.0–36.0)
MCV: 94.4 fL (ref 80.0–100.0)
Platelets: 71 10*3/uL — ABNORMAL LOW (ref 150–400)
RBC: 3.58 MIL/uL — ABNORMAL LOW (ref 4.22–5.81)
RDW: 16.1 % — ABNORMAL HIGH (ref 11.5–15.5)
WBC: 5 10*3/uL (ref 4.0–10.5)
nRBC: 0 % (ref 0.0–0.2)

## 2023-07-12 LAB — BASIC METABOLIC PANEL
Anion gap: 13 (ref 5–15)
BUN: 51 mg/dL — ABNORMAL HIGH (ref 8–23)
CO2: 19 mmol/L — ABNORMAL LOW (ref 22–32)
Calcium: 7.9 mg/dL — ABNORMAL LOW (ref 8.9–10.3)
Chloride: 101 mmol/L (ref 98–111)
Creatinine, Ser: 10 mg/dL — ABNORMAL HIGH (ref 0.61–1.24)
GFR, Estimated: 5 mL/min — ABNORMAL LOW (ref >60)
Glucose, Bld: 96 mg/dL (ref 70–99)
Potassium: 4.1 mmol/L (ref 3.5–5.1)
Sodium: 133 mmol/L — ABNORMAL LOW (ref 135–145)

## 2023-07-12 LAB — PHOSPHORUS: Phosphorus: 5.7 mg/dL — ABNORMAL HIGH (ref 2.5–4.6)

## 2023-07-12 LAB — MAGNESIUM: Magnesium: 2 mg/dL (ref 1.7–2.4)

## 2023-07-12 LAB — HEPATITIS B SURFACE ANTIBODY, QUANTITATIVE: Hep B S AB Quant (Post): <3.5 m[IU]/mL — ABNORMAL LOW

## 2023-07-12 LAB — GLUCOSE, CAPILLARY: Glucose-Capillary: 94 mg/dL (ref 70–99)

## 2023-07-12 MED ORDER — HEPARIN SODIUM (PORCINE) 1000 UNIT/ML DIALYSIS: 1000.0000 [IU] | INTRAMUSCULAR | Status: DC | PRN

## 2023-07-12 MED ORDER — ALTEPLASE 2 MG IJ SOLR: 2.0000 mg | Freq: Once | INTRAMUSCULAR | Status: DC | PRN

## 2023-07-12 MED ORDER — HEPARIN SODIUM (PORCINE) 1000 UNIT/ML IJ SOLN
INTRAMUSCULAR | Status: AC
  Filled 2023-07-12: qty 10

## 2023-07-12 NOTE — Progress Notes (Signed)
 [facility]  ROUNDING NOTE   Subjective:  Mr. LAVOY BERNARDS IV is a 73 y.o.  male with past medical conditions including alcohol use disorder, COPD with 2 L nasal cannula, diet-controlled diabetes, hepatitis C, seizure disorder, hypertension, and vitamin B12 deficiency, who was admitted to [facility] on 07/09/2023 for Thrombocytopenia (HCC) [D69.6] AKI (acute kidney injury) (HCC) [N17.9] Frequent falls [R29.6] Increased anion gap metabolic acidosis [E87.29] Injury of head, initial encounter [S09.90XA] Update Patient seen on dialysis, tolerating treatment. First treatment today.  Hemodialysis dialysis treatment flowsheet  Blood flow rate (mL/min): 200 Arterial pressures (mmHg): -89.69 Venous pressures (mmHg): 51.51 TMP (mmHg): 12.52 Ultrafiltration rate (mL/min): 0 Dialysate (mL/min): 300   Objective:  Vital signs in last 24 hours:  Temp:  [97.8 F (36.6 C)-98.5 F (36.9 C)] 97.8 F (36.6 C) (03/01 1005) Pulse Rate:  [70-84] 81 (03/01 1005) Resp:  [11-20] 16 (03/01 1005) BP: (105-134)/(53-94) 120/62 (03/01 1005) SpO2:  [88 %-99 %] 94 % (03/01 1005) Weight:  [97.3 kg] 97.3 kg (03/01 1005)  Weight change:  Filed Weights   07/10/23 0352 07/12/23 0754 07/12/23 1005  Weight: 94 kg 97.3 kg 97.3 kg    Intake/Output: I/O last 3 completed shifts: In: 858.4 [P.O.:120; I.V.:738.4] Out: -    Intake/Output this shift:  Total I/O In: -  Out: 1050 [Urine:1050]  Physical Exam: General: NAD,   Head: Normocephalic, atraumatic. Moist oral mucosal membranes  Eyes: Anicteric, PERRL  Neck: Supple, trachea midline  Lungs:  Clear to auscultation  Heart: Regular rate and rhythm  Abdomen:  Soft, nontender,   Extremities:  No peripheral edema.  Neurologic: Nonfocal, moving all four extremities  Skin: No lesions  Access: Rt temp femoral catheter    Basic Metabolic Panel: Recent Labs  Lab 07/09/23 1916 07/10/23 0155 07/11/23 0438 07/12/23 0557  NA 131* 128* 130*  133*  K 3.7 3.4* 3.4* 4.1  CL 95* 94* 96* 101  CO2 18* 18* 19* 19*  GLUCOSE 116* 94 91 96  BUN 38* 40* 48* 51*  CREATININE 8.29* 8.52* 9.76* 10.00*  CALCIUM 8.1* 7.6* 7.6* 7.9*  MG  --   --  2.0 2.0  PHOS  --   --  5.1* 5.7*    Liver Function Tests: Recent Labs  Lab 07/09/23 1916  AST 48*  ALT 20  ALKPHOS 64  BILITOT 1.1  PROT 7.2  ALBUMIN 3.8   No results for input(s): "LIPASE", "AMYLASE" in the last 168 hours. No results for input(s): "AMMONIA" in the last 168 hours.  CBC: Recent Labs  Lab 07/09/23 1916 07/10/23 0155 07/12/23 0852  WBC 6.9  6.8 6.0 5.0  NEUTROABS 5.5  --   --   HGB 12.4*  12.5* 12.0* 11.3*  HCT 36.7*  36.9* 35.1* 33.8*  MCV 94.6  96.3 95.4 94.4  PLT 79*  76* 68* 71*    Cardiac Enzymes: No results for input(s): "CKTOTAL", "CKMB", "CKMBINDEX", "TROPONINI" in the last 168 hours.  BNP: Invalid input(s): "POCBNP"  CBG: Recent Labs  Lab 07/11/23 0745 07/11/23 1115 07/11/23 1700 07/11/23 2035 07/12/23 1118  GLUCAP 86 100* 92 99 94    Microbiology: Results for orders placed or performed during the hospital encounter of 02/15/23  Resp panel by RT-PCR (RSV, Flu A&B, Covid) Anterior Nasal Swab     Status: None   Collection Time: 02/15/23  3:41 PM   Specimen: Anterior Nasal Swab  Result Value Ref Range Status   SARS Coronavirus 2 by RT PCR NEGATIVE NEGATIVE  Final    Comment: (NOTE) SARS-CoV-2 target nucleic acids are NOT DETECTED.  The SARS-CoV-2 RNA is generally detectable in upper respiratory specimens during the acute phase of infection. The lowest concentration of SARS-CoV-2 viral copies this assay can detect is 138 copies/mL. A negative result does not preclude SARS-Cov-2 infection and should not be used as the sole basis for treatment or other patient management decisions. A negative result may occur with  improper specimen collection/handling, submission of specimen other than nasopharyngeal swab, presence of viral  mutation(s) within the areas targeted by this assay, and inadequate number of viral copies(<138 copies/mL). A negative result must be combined with clinical observations, patient history, and epidemiological information. The expected result is Negative.  Fact Sheet for Patients:  BloggerCourse.com  Fact Sheet for Healthcare Providers:  SeriousBroker.it  This test is no t yet approved or cleared by the [place] FDA and  has been authorized for detection and/or diagnosis of SARS-CoV-2 by FDA under an Emergency Use Authorization (EUA). This EUA will remain  in effect (meaning this test can be used) for the duration of the COVID-19 declaration under Section 564(b)(1) of the Act, 21 U.S.C.section 360bbb-3(b)(1), unless the authorization is terminated  or revoked sooner.       Influenza A by PCR NEGATIVE NEGATIVE Final   Influenza B by PCR NEGATIVE NEGATIVE Final    Comment: (NOTE) The Xpert Xpress SARS-CoV-2/FLU/RSV plus assay is intended as an aid in the diagnosis of influenza from Nasopharyngeal swab specimens and should not be used as a sole basis for treatment. Nasal washings and aspirates are unacceptable for Xpert Xpress SARS-CoV-2/FLU/RSV testing.  Fact Sheet for Patients: BloggerCourse.com  Fact Sheet for Healthcare Providers: SeriousBroker.it  This test is not yet approved or cleared by the [place] FDA and has been authorized for detection and/or diagnosis of SARS-CoV-2 by FDA under an Emergency Use Authorization (EUA). This EUA will remain in effect (meaning this test can be used) for the duration of the COVID-19 declaration under Section 564(b)(1) of the Act, 21 U.S.C. section 360bbb-3(b)(1), unless the authorization is terminated or revoked.     Resp Syncytial Virus by PCR NEGATIVE NEGATIVE Final    Comment: (NOTE) Fact Sheet for  Patients: BloggerCourse.com  Fact Sheet for Healthcare Providers: SeriousBroker.it  This test is not yet approved or cleared by the [place] FDA and has been authorized for detection and/or diagnosis of SARS-CoV-2 by FDA under an Emergency Use Authorization (EUA). This EUA will remain in effect (meaning this test can be used) for the duration of the COVID-19 declaration under Section 564(b)(1) of the Act, 21 U.S.C. section 360bbb-3(b)(1), unless the authorization is terminated or revoked.  Performed at [facility], [address] [place], [place] [address]   Blood Culture (routine x 2)     Status: None   Collection Time: 02/15/23  7:44 PM   Specimen: Right Antecubital; Blood  Result Value Ref Range Status   Specimen Description RIGHT ANTECUBITAL  Final   Special Requests   Final    BOTTLES DRAWN AEROBIC AND ANAEROBIC Blood Culture adequate volume   Culture   Final    NO GROWTH 5 DAYS Performed at [facility], [address], [place], [place] [address]    Report Status 02/20/2023 FINAL  Final  Blood Culture (routine x 2)     Status: None   Collection Time: 02/16/23  4:17 PM   Specimen: BLOOD  Result Value Ref Range Status   Specimen Description BLOOD  BLOOD RIGHT ARM  Final   Special Requests   Final    BOTTLES DRAWN AEROBIC AND ANAEROBIC Blood Culture adequate volume   Culture   Final    NO GROWTH 5 DAYS Performed at [facility], [address] [place], [place] [address]    Report Status 02/21/2023 FINAL  Final    Coagulation Studies: Recent Labs    07/09/23 2340  LABPROT 14.2  INR 1.1    Urinalysis: No results for input(s): "COLORURINE", "LABSPEC", "PHURINE", "GLUCOSEU", "HGBUR", "BILIRUBINUR", "KETONESUR", "PROTEINUR", "UROBILINOGEN", "NITRITE", "LEUKOCYTESUR" in the last 72 hours.  Invalid input(s): "APPERANCEUR"    Imaging: PERIPHERAL VASCULAR CATHETERIZATION Result  Date: 07/11/2023 See surgical note for result.    Medications:    sodium chloride 75 mL/hr at 07/11/23 1711    amitriptyline  75 mg Oral QHS   aspirin EC  81 mg Oral Daily   Chlorhexidine Gluconate Cloth  6 each Topical Q0600   folic acid  1 mg Oral Daily   [START ON 07/13/2023] gabapentin  200 mg Oral Q1200   heparin injection (subcutaneous)  5,000 Units Subcutaneous Q8H   multivitamin with minerals  1 tablet Oral Daily   nicotine  14 mg Transdermal Daily   rosuvastatin  40 mg Oral Daily   thiamine  100 mg Oral Daily   acetaminophen **OR** acetaminophen, albuterol, HYDROcodone-acetaminophen, LORazepam **OR** LORazepam, ondansetron **OR** ondansetron (ZOFRAN) IV, mouth rinse  Assessment/ Plan:  Mr. WYNDELL CARDIFF IV is a 73 y.o.  male with past medical conditions including alcohol use disorder, COPD with 2 L nasal cannula, diet-controlled diabetes, hepatitis C, seizure disorder, hypertension, and vitamin B12 deficiency, who was admitted to [facility] on 07/09/2023 for Thrombocytopenia (HCC) [D69.6] AKI (acute kidney injury) (HCC) [N17.9] Frequent falls [R29.6] Increased anion gap metabolic acidosis [E87.29] Injury of head, initial encounter [S09.90XA]     Acute kidney injury likely secondary to dehydration with NSAID use. Normal renal function noted 2 weeks prior. Renal ultrasound negative for hydronephrosis. No recent contrast exposure. Continue holding ibuprofen and lisinopril.  Renal function has not improved despite IV hydration. Lab work-up for potential causation in process.   Lab Results  Component Value Date   CREATININE 10.00 (H) 07/12/2023   CREATININE 9.76 (H) 07/11/2023   CREATININE 8.52 (H) 07/10/2023     Intake/Output Summary (Last 24 hours) at 07/12/2023 1310 Last data filed at 07/12/2023 1152 Gross per 24 hour  Intake --  Output 1050 ml  Net -1050 ml      2.  Hypertension, essential.  Home regimen includes amlodipine and lisinopril.  Both currently held.                          Blood pressure soft this morning, 120/62   3. Diabetes mellitus type II, renal manifestations: noninsulin dependent. Most recent hemoglobin A1c is 6.1 on 06/25/23.  Currently diet controlled   4. Anemia with thrombocytopenia Recent Labs       Lab Results  Component Value Date    HGB 11.3 (L) 07/12/2023      Hemoglobin within desired range.  No updated labs today.   LOS: 3 Rahshawn Remo Tonny Bollman 3/1/20251:08 PM

## 2023-07-12 NOTE — Plan of Care (Signed)

## 2023-07-12 NOTE — Progress Notes (Signed)
  PROGRESS NOTE    [name]  JWJ:[id number] DOB: Sep 14, 1950 DOA: 07/09/2023 PCP: Sallee Provencal, FNP  206A/206A-AA  LOS: 3 days   Brief hospital course:   Assessment & Plan: [name] is a 73 y.o. 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    * AKI (acute kidney injury) (HCC) High anion gap metabolic acidosis Suspect secondary to ibuprofen use, possibly recent lisinopril.  Oliguric.   --nephrology consulted Plan: --cont MIVF per nephro --start dialysis today  Anemia Thrombocytopenia Hemoglobin 12 down from 14 just a couple weeks prior Platelet 76,000, down from 155 Etiology uncertain, possibly related to kidney disease, bone marrow suppression, hemolytic uremic syndrome, other --monitor  Frequent falls Generalized weakness Vitamin B12 deficiency  --PT/OT rec SNF rehab  diabetic polyneuropathy Complex regional pain syndrome of both lower extremities --taper down home gabapentin due to kidney fxn decline  COPD (chronic obstructive pulmonary disease) (HCC) Chronic respiratory failure with hypoxia Not acutely exacerbated  Alcohol use disorder Thiamine folate and multivitamin --CIWA  Dementia without behavioral disturbance (HCC) Delirium precautions  Type 2 diabetes mellitus without complication, without long-term current use of insulin (HCC) --no need for BG checks and SSI  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  Followed by oncology and ENT  History of hepatitis C --hep C RNA  Tobacco use disorder Nicotine patch  Essential hypertension Blood pressure  on the soft side  --hold BP meds  Seizure disorder (HCC) Not on specific AEDs   DVT prophylaxis: Heparin SQ Code Status: DNR  Family Communication:  Level of care: Med-Surg Dispo:   The patient is from: home Anticipated d/c is to: SNF rehab Anticipated d/c date is: > 3 days   Subjective and Interval History:  Pt received dialysis today.  Making little bit of urine.   Objective: Vitals:   07/12/23 0930 07/12/23 1000 07/12/23 1005 07/12/23 1452  BP: 109/62 (!) 117/53 120/62 105/63  Pulse: 78 74 81 72  Resp: 11 14 16 14   Temp:   97.8 F (36.6 C)   TempSrc:   Oral   SpO2: 95%  94% 93%  Weight:   97.3 kg   Height:        Intake/Output Summary (Last 24 hours) at 07/12/2023 1934 Last data filed at 07/12/2023 1152 Gross per 24 hour  Intake --  Output 1050 ml  Net -1050 ml   Filed Weights   07/10/23 0352 07/12/23 0754 07/12/23 1005  Weight: 94 kg 97.3 kg 97.3 kg    Examination:   Constitutional: NAD, awake, seems a bit confused HEENT: conjunctivae and lids normal, EOMI CV: No cyanosis.   RESP: normal respiratory effort, on RA SKIN: warm, dry Neuro: II - XII grossly intact.     Data Reviewed: I have personally reviewed labs and imaging studies  Time spent: 35 minutes  Darlin Priestly, MD [facility] If 7PM-7AM, please contact night-coverage 07/12/2023, 7:34 PM

## 2023-07-12 NOTE — Progress Notes (Signed)
 Hemodialysis Note:  Received patient in bed to unit. Alert and oriented. Informed consent singed and in chart.  Treatment initiated: 0800 Treatment completed: 1005  Access used: Right femoral catheter Access issues: None  Patient tolerated well. Transported back to room, alert without acute distress. Report given to patient's RN.  Total UF removed: 0 Medications given: None  Post HD weight: 97.3 Kg  [facility]

## 2023-07-12 NOTE — Care Management Important Message (Signed)
 Important Message  Patient Details  Name: [name] MRN: [id number] Date of Birth: 05-09-1951   Important Message Given:  Yes - Medicare IM     Cristela Blue, CMA 07/12/2023, 9:41 AM

## 2023-07-13 DIAGNOSIS — N179 Acute kidney failure, unspecified: Secondary | ICD-10-CM | POA: Diagnosis not present

## 2023-07-13 LAB — URINALYSIS, COMPLETE (UACMP) WITH MICROSCOPIC
Bilirubin Urine: NEGATIVE
Glucose, UA: NEGATIVE mg/dL
Ketones, ur: NEGATIVE mg/dL
Leukocytes,Ua: NEGATIVE
Nitrite: NEGATIVE
Protein, ur: 30 mg/dL — AB
Specific Gravity, Urine: 1.005 (ref 1.005–1.030)
pH: 5 (ref 5.0–8.0)

## 2023-07-13 LAB — C3 COMPLEMENT: C3 Complement: 115 mg/dL (ref 82–167)

## 2023-07-13 LAB — GLUCOSE, CAPILLARY
Glucose-Capillary: 77 mg/dL (ref 70–99)
Glucose-Capillary: 165 mg/dL — ABNORMAL HIGH (ref 70–99)

## 2023-07-13 LAB — BASIC METABOLIC PANEL
Anion gap: 12 (ref 5–15)
BUN: 40 mg/dL — ABNORMAL HIGH (ref 8–23)
CO2: 19 mmol/L — ABNORMAL LOW (ref 22–32)
Calcium: 8.2 mg/dL — ABNORMAL LOW (ref 8.9–10.3)
Chloride: 103 mmol/L (ref 98–111)
Creatinine, Ser: 8.04 mg/dL — ABNORMAL HIGH (ref 0.61–1.24)
GFR, Estimated: 7 mL/min — ABNORMAL LOW (ref >60)
Glucose, Bld: 86 mg/dL (ref 70–99)
Potassium: 4 mmol/L (ref 3.5–5.1)
Sodium: 134 mmol/L — ABNORMAL LOW (ref 135–145)

## 2023-07-13 LAB — MAGNESIUM: Magnesium: 1.9 mg/dL (ref 1.7–2.4)

## 2023-07-13 LAB — C4 COMPLEMENT: Complement C4, Body Fluid: 26 mg/dL (ref 12–38)

## 2023-07-13 LAB — PROTEIN / CREATININE RATIO, URINE
Creatinine, Urine: 39 mg/dL
Protein Creatinine Ratio: 1.41 mg/mg{creat} — ABNORMAL HIGH (ref 0.00–0.15)
Total Protein, Urine: 55 mg/dL

## 2023-07-13 LAB — ANTISTREPTOLYSIN O TITER: ASO: 57 [IU]/mL (ref 0.0–200.0)

## 2023-07-13 LAB — ANA W/REFLEX: Anti Nuclear Antibody (ANA): NEGATIVE

## 2023-07-13 MED ORDER — LORAZEPAM 1 MG PO TABS: 1.0000 mg | ORAL_TABLET | ORAL | Status: DC | PRN

## 2023-07-13 MED ORDER — CHLORHEXIDINE GLUCONATE CLOTH 2 % EX PADS
6.0000 | MEDICATED_PAD | Freq: Every day | CUTANEOUS | Status: DC
  Administered 2023-07-14 – 2023-07-22 (×9): 6 via TOPICAL

## 2023-07-13 MED ORDER — HALOPERIDOL LACTATE 5 MG/ML IJ SOLN: 2.0000 mg | Freq: Four times a day (QID) | INTRAMUSCULAR | Status: DC | PRN

## 2023-07-13 MED ORDER — LORAZEPAM 2 MG/ML IJ SOLN
1.0000 mg | INTRAMUSCULAR | Status: DC | PRN
  Administered 2023-07-13: 3 mg via INTRAVENOUS
  Filled 2023-07-13: qty 2

## 2023-07-13 NOTE — Plan of Care (Signed)

## 2023-07-13 NOTE — Progress Notes (Addendum)
 [facility]  ROUNDING NOTE   Subjective:  [name] is a 73 y.o.  male with past medical conditions including alcohol use disorder, COPD with 2 L nasal cannula, diet-controlled diabetes, hepatitis C, seizure disorder, hypertension, and vitamin B12 deficiency, who was admitted to [facility] on 07/09/2023 for Thrombocytopenia (HCC) [D69.6] AKI (acute kidney injury) (HCC) [N17.9] Frequent falls [R29.6] Increased anion gap metabolic acidosis [E87.29] Injury of head, initial encounter [S09.90XA] Update Patient seen in bed resting. Patient very sleepy, no acute signs of distress.     Objective:  Vital signs in last 24 hours:  Temp:  [97.8 F (36.6 C)-98 F (36.7 C)] 97.8 F (36.6 C) (03/02 0837) Pulse Rate:  [65-80] 65 (03/02 0837) Resp:  [14-18] 18 (03/02 0837) BP: (105-151)/(57-109) 115/57 (03/02 0837) SpO2:  [81 %-97 %] 97 % (03/02 0837)  Weight change:  Filed Weights   07/10/23 0352 07/12/23 0754 07/12/23 1005  Weight: 94 kg 97.3 kg 97.3 kg    Intake/Output: I/O last 3 completed shifts: In: -  Out: 1600 [Urine:1600]   Intake/Output this shift:  Total I/O In: 2880.2 [P.O.:120; I.V.:2760.2] Out: 550 [Urine:550]  Physical Exam: General: NAD,   Head: Normocephalic, atraumatic. Moist oral mucosal membranes  Eyes: Anicteric, PERRL  Neck: Supple, trachea midline  Lungs:  Clear to auscultation  Heart: Regular rate and rhythm  Abdomen:  Soft, nontender,   Extremities:  No peripheral edema.  Neurologic: Nonfocal, moving all four extremities  Skin: No lesions  Access: Rt temp femoral catheter    Basic Metabolic Panel: Recent Labs  Lab 07/09/23 1916 07/10/23 0155 07/11/23 0438 07/12/23 0557 07/13/23 0722  NA 131* 128* 130* 133* 134*  K 3.7 3.4* 3.4* 4.1 4.0  CL 95* 94* 96* 101 103  CO2 18* 18* 19* 19* 19*  GLUCOSE 116* 94 91 96 86  BUN 38* 40* 48* 51* 40*  CREATININE 8.29* 8.52* 9.76* 10.00* 8.04*  CALCIUM 8.1* 7.6* 7.6* 7.9* 8.2*  MG   --   --  2.0 2.0 1.9  PHOS  --   --  5.1* 5.7*  --     Liver Function Tests: Recent Labs  Lab 07/09/23 1916  AST 48*  ALT 20  ALKPHOS 64  BILITOT 1.1  PROT 7.2  ALBUMIN 3.8   No results for input(s): "LIPASE", "AMYLASE" in the last 168 hours. No results for input(s): "AMMONIA" in the last 168 hours.  CBC: Recent Labs  Lab 07/09/23 1916 07/10/23 0155 07/12/23 0852  WBC 6.9  6.8 6.0 5.0  NEUTROABS 5.5  --   --   HGB 12.4*  12.5* 12.0* 11.3*  HCT 36.7*  36.9* 35.1* 33.8*  MCV 94.6  96.3 95.4 94.4  PLT 79*  76* 68* 71*    Cardiac Enzymes: No results for input(s): "CKTOTAL", "CKMB", "CKMBINDEX", "TROPONINI" in the last 168 hours.  BNP: Invalid input(s): "POCBNP"  CBG: Recent Labs  Lab 07/11/23 1700 07/11/23 2035 07/12/23 1118 07/13/23 0807 07/13/23 1126  GLUCAP 92 99 94 77 165*    Microbiology: Results for orders placed or performed during the hospital encounter of 02/15/23  Resp panel by RT-PCR (RSV, Flu A&B, Covid) Anterior Nasal Swab     Status: None   Collection Time: 02/15/23  3:41 PM   Specimen: Anterior Nasal Swab  Result Value Ref Range Status   SARS Coronavirus 2 by RT PCR NEGATIVE NEGATIVE Final    Comment: (NOTE) SARS-CoV-2 target nucleic acids are NOT DETECTED.  The SARS-CoV-2 RNA  is generally detectable in upper respiratory specimens during the acute phase of infection. The lowest concentration of SARS-CoV-2 viral copies this assay can detect is 138 copies/mL. A negative result does not preclude SARS-Cov-2 infection and should not be used as the sole basis for treatment or other patient management decisions. A negative result may occur with  improper specimen collection/handling, submission of specimen other than nasopharyngeal swab, presence of viral mutation(s) within the areas targeted by this assay, and inadequate number of viral copies(<138 copies/mL). A negative result must be combined with clinical observations, patient history,  and epidemiological information. The expected result is Negative.  Fact Sheet for Patients:  BloggerCourse.com  Fact Sheet for Healthcare Providers:  SeriousBroker.it  This test is no t yet approved or cleared by the [place] FDA and  has been authorized for detection and/or diagnosis of SARS-CoV-2 by FDA under an Emergency Use Authorization (EUA). This EUA will remain  in effect (meaning this test can be used) for the duration of the COVID-19 declaration under Section 564(b)(1) of the Act, 21 U.S.C.section 360bbb-3(b)(1), unless the authorization is terminated  or revoked sooner.       Influenza A by PCR NEGATIVE NEGATIVE Final   Influenza B by PCR NEGATIVE NEGATIVE Final    Comment: (NOTE) The Xpert Xpress SARS-CoV-2/FLU/RSV plus assay is intended as an aid in the diagnosis of influenza from Nasopharyngeal swab specimens and should not be used as a sole basis for treatment. Nasal washings and aspirates are unacceptable for Xpert Xpress SARS-CoV-2/FLU/RSV testing.  Fact Sheet for Patients: BloggerCourse.com  Fact Sheet for Healthcare Providers: SeriousBroker.it  This test is not yet approved or cleared by the [place] FDA and has been authorized for detection and/or diagnosis of SARS-CoV-2 by FDA under an Emergency Use Authorization (EUA). This EUA will remain in effect (meaning this test can be used) for the duration of the COVID-19 declaration under Section 564(b)(1) of the Act, 21 U.S.C. section 360bbb-3(b)(1), unless the authorization is terminated or revoked.     Resp Syncytial Virus by PCR NEGATIVE NEGATIVE Final    Comment: (NOTE) Fact Sheet for Patients: BloggerCourse.com  Fact Sheet for Healthcare Providers: SeriousBroker.it  This test is not yet approved or cleared by the [place] FDA and has  been authorized for detection and/or diagnosis of SARS-CoV-2 by FDA under an Emergency Use Authorization (EUA). This EUA will remain in effect (meaning this test can be used) for the duration of the COVID-19 declaration under Section 564(b)(1) of the Act, 21 U.S.C. section 360bbb-3(b)(1), unless the authorization is terminated or revoked.  Performed at [facility], [address] [place], [place] [address]   Blood Culture (routine x 2)     Status: None   Collection Time: 02/15/23  7:44 PM   Specimen: Right Antecubital; Blood  Result Value Ref Range Status   Specimen Description RIGHT ANTECUBITAL  Final   Special Requests   Final    BOTTLES DRAWN AEROBIC AND ANAEROBIC Blood Culture adequate volume   Culture   Final    NO GROWTH 5 DAYS Performed at [facility], [address] [place], [place] [address]    Report Status 02/20/2023 FINAL  Final  Blood Culture (routine x 2)     Status: None   Collection Time: 02/16/23  4:17 PM   Specimen: BLOOD  Result Value Ref Range Status   Specimen Description BLOOD BLOOD RIGHT ARM  Final   Special Requests   Final    BOTTLES DRAWN  AEROBIC AND ANAEROBIC Blood Culture adequate volume   Culture   Final    NO GROWTH 5 DAYS Performed at [facility], [address] [place], [place] [address]    Report Status 02/21/2023 FINAL  Final    Coagulation Studies: No results for input(s): "LABPROT", "INR" in the last 72 hours.  Urinalysis: Recent Labs    07/12/23 1104  COLORURINE YELLOW*  LABSPEC 1.005  PHURINE 5.0  GLUCOSEU NEGATIVE  HGBUR MODERATE*  BILIRUBINUR NEGATIVE  KETONESUR NEGATIVE  PROTEINUR 30*  NITRITE NEGATIVE  LEUKOCYTESUR NEGATIVE      Imaging: PERIPHERAL VASCULAR CATHETERIZATION Result Date: 07/11/2023 See surgical note for result.    Medications:     amitriptyline  75 mg Oral QHS   aspirin EC  81 mg Oral Daily   Chlorhexidine Gluconate Cloth  6 each Topical Q0600   folic acid   1 mg Oral Daily   gabapentin  200 mg Oral Q1200   heparin injection (subcutaneous)  5,000 Units Subcutaneous Q8H   multivitamin with minerals  1 tablet Oral Daily   nicotine  14 mg Transdermal Daily   rosuvastatin  40 mg Oral Daily   thiamine  100 mg Oral Daily   acetaminophen **OR** acetaminophen, albuterol, haloperidol lactate, HYDROcodone-acetaminophen, LORazepam **OR** LORazepam, ondansetron **OR** ondansetron (ZOFRAN) West, mouth rinse  Assessment/ Plan:  [name] is a 73 y.o.  male with past medical conditions including alcohol use disorder, COPD with 2 L nasal cannula, diet-controlled diabetes, hepatitis C, seizure disorder, hypertension, and vitamin B12 deficiency, who was admitted to [facility] on 07/09/2023 for Thrombocytopenia (HCC) [D69.6] AKI (acute kidney injury) (HCC) [N17.9] Frequent falls [R29.6] Increased anion gap metabolic acidosis [E87.29] Injury of head, initial encounter [S09.90XA]     Acute Kidney Injury requiring hemodialysis. Patient with no history of kidney disease.    Differential is large. Urine studies were not sent. No West contrast exposure. No obstruction on ultrasound. Possibly acute kidney injury from NSAIDs and ACE-I concurrently but baseline creatinine of 1.2 with normal range GFR on 06/25/23.  Sent glomerulonephropathy work up: ANA for lupus nephritis, ANCA and anti-GBM for pulmonary-renal syndrome, ASO for post infectious glomerulonephritis, SPEP/UPEP for blood cell dyscrasias, and complement levels for complement mediated glomerulonephritis.  Patient is aware that he may need a renal biopsy to complete work up.  Patient tolerated first dialysis treatment yesterday. Plan for second treatment tomorrow.  Lab Results  Component Value Date   CREATININE 8.04 (H) 07/13/2023   CREATININE 10.00 (H) 07/12/2023   CREATININE 9.76 (H) 07/11/2023     Intake/Output Summary (Last 24 hours) at 07/13/2023 1227 Last data filed at 07/13/2023 1100 Gross per 24  hour  Intake 2880.17 ml  Output 1100 ml  Net 1780.17 ml        2.  Hypertension, essential.  Home regimen includes amlodipine and lisinopril.  Both currently held.                         Blood pressure 115/57   3. Diabetes mellitus type II, renal manifestations: noninsulin dependent. Most recent hemoglobin A1c is 6.1 on 06/25/23.  Currently diet controlled   4. Anemia with thrombocytopenia Recent Labs           Lab Results  Component Value Date    HGB 11.3 (L) 07/12/2023      Hemoglobin within desired range.  No updated labs today.     LOS: 4 Elfrida Pixley Tonny Bollman 3/2/202512:24  PM

## 2023-07-13 NOTE — Progress Notes (Signed)
  PROGRESS NOTE    [name]  ZOX:[id number] DOB: January 16, 1951 DOA: 07/09/2023 PCP: Anthony Provencal, FNP  206A/206A-AA  LOS: 4 days   Brief hospital course:   Assessment & Plan: [name] is a 73 y.o. 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    * AKI (acute kidney injury) (HCC) High anion gap metabolic acidosis Suspect secondary to ibuprofen use, possibly recent lisinopril.  Oliguric.   --nephrology consulted, started on dialysis on 3/1 Plan: --d/c MIVF --iHD per nephro --workup for cause of AKI per nephro  Anemia Thrombocytopenia Hemoglobin 12 down from 14 just a couple weeks prior Platelet 76,000, down from 155 Etiology uncertain, possibly related to kidney disease, bone marrow suppression, hemolytic uremic syndrome, other --monitor  Frequent falls Generalized weakness Vitamin B12 deficiency  --PT/OT rec SNF rehab  diabetic polyneuropathy Complex regional pain syndrome of both lower extremities --taper down home gabapentin due to kidney fxn decline  COPD (chronic obstructive pulmonary disease) (HCC) Chronic respiratory failure with hypoxia Not acutely exacerbated  Alcohol use disorder Thiamine folate and multivitamin --CIWA  Dementia without behavioral disturbance (HCC) Hypoactive delirium --developed more confusion and lethargy today --sitter requested --West haldol PRN  Type 2 diabetes mellitus without complication, without long-term current use of insulin (HCC) --no need for BG checks and SSI  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  Followed by  oncology and ENT  History of hepatitis C --hep C RNA  Tobacco use disorder Nicotine patch  Essential hypertension Blood pressure on the soft side  --hold BP meds  Seizure disorder (HCC) Not on specific AEDs   DVT prophylaxis: Heparin SQ Code Status: DNR  Family Communication: friend updated at bedside today, pt agreed for friend to be added on the contact list Level of care: Med-Surg Dispo:   The patient is from: home Anticipated d/c is to: SNF rehab Anticipated d/c date is: > 3 days   Subjective and Interval History:  Pt was more confused and sleepy today.   Objective: Vitals:   07/13/23 0443 07/13/23 0803 07/13/23 0837 07/13/23 1624  BP: 136/88 (!) 151/109 (!) 115/57 126/84  Pulse: 80 78 65 60  Resp: 18 16 18 17   Temp: 98 F (36.7 C)  81.1 F (36.6 C) 98 F (36.7 C)  TempSrc: Oral  Oral Oral  SpO2: 93% (!) 81% 97% 97%  Weight:      Height:        Intake/Output Summary (Last 24 hours) at 07/13/2023 1821 Last data filed at 07/13/2023 1624 Gross per 24 hour  Intake 2880.17 ml  Output 1475 ml  Net 1405.17 ml   Filed Weights   07/10/23 0352 07/12/23 0754 07/12/23 1005  Weight: 94 kg 97.3 kg 97.3 kg    Examination:   Constitutional: NAD, sleepy but arousable HEENT: conjunctivae and lids normal, EOMI CV: No cyanosis.   RESP: normal respiratory effort, on RA   Data Reviewed: I have personally reviewed labs and imaging studies  Time spent: 35 minutes  Darlin Priestly, MD [facility] If 7PM-7AM, please contact night-coverage 07/13/2023, 6:21 PM

## 2023-07-13 NOTE — TOC Progression Note (Signed)
 Transition of Care [facility]) - Progression Note    Patient Details  Name: [name] MRN: [id number] Date of Birth: [birth year]  [facility]) CM/SW Contact  Bing Quarry, RN Phone Number: 07/13/2023, 11:09 AM  Clinical Narrative:  3/2: To inform patient of bed offers but patient is only oriented to self per RN am assessment.  \  Gabriel Cirri MSN RN CM  RN Case Manager [place]  Transitions of Care Direct Dial: [phone] (Weekends Only) [facility] Main Office Phone: [phone] [facility] Fax: [phone] [email or web address]          Expected Discharge Plan and Services                                               Social Determinants of Health (SDOH) Interventions SDOH Screenings   Food Insecurity: No Food Insecurity (07/10/2023)  Housing: Low Risk  (07/10/2023)  Transportation Needs: No Transportation Needs (07/10/2023)  Utilities: Not At Risk (07/10/2023)  Alcohol Screen: Low Risk  (10/28/2022)  Depression (PHQ2-9): Low Risk  (06/25/2023)  Financial Resource Strain: Low Risk  (10/22/2022)  Physical Activity: Inactive (10/22/2022)  Social Connections: Socially Isolated (07/10/2023)  Stress: Stress Concern Present (01/29/2023)  Tobacco Use: Medium Risk (07/09/2023)    Readmission Risk Interventions     No data to display

## 2023-07-13 NOTE — Plan of Care (Signed)

## 2023-07-14 ENCOUNTER — Encounter: Payer: Self-pay | Admitting: Vascular Surgery

## 2023-07-14 DIAGNOSIS — N179 Acute kidney failure, unspecified: Secondary | ICD-10-CM | POA: Diagnosis not present

## 2023-07-14 LAB — CBC
HCT: 32.2 % — ABNORMAL LOW (ref 39.0–52.0)
Hemoglobin: 10.9 g/dL — ABNORMAL LOW (ref 13.0–17.0)
MCH: 31.4 pg (ref 26.0–34.0)
MCHC: 33.9 g/dL (ref 30.0–36.0)
MCV: 92.8 fL (ref 80.0–100.0)
Platelets: 88 10*3/uL — ABNORMAL LOW (ref 150–400)
RBC: 3.47 MIL/uL — ABNORMAL LOW (ref 4.22–5.81)
RDW: 16.2 % — ABNORMAL HIGH (ref 11.5–15.5)
WBC: 3.1 10*3/uL — ABNORMAL LOW (ref 4.0–10.5)
nRBC: 0 % (ref 0.0–0.2)

## 2023-07-14 LAB — PROTEIN ELECTROPHORESIS, SERUM
A/G Ratio: 1.1 (ref 0.7–1.7)
Albumin ELP: 3.1 g/dL (ref 2.9–4.4)
Alpha-1-Globulin: 0.3 g/dL (ref 0.0–0.4)
Alpha-2-Globulin: 0.9 g/dL (ref 0.4–1.0)
Beta Globulin: 0.7 g/dL (ref 0.7–1.3)
Gamma Globulin: 1 g/dL (ref 0.4–1.8)
Globulin, Total: 2.8 g/dL (ref 2.2–3.9)
Total Protein ELP: 5.9 g/dL — ABNORMAL LOW (ref 6.0–8.5)

## 2023-07-14 LAB — BASIC METABOLIC PANEL
Anion gap: 11 (ref 5–15)
BUN: 46 mg/dL — ABNORMAL HIGH (ref 8–23)
CO2: 23 mmol/L (ref 22–32)
Calcium: 8.6 mg/dL — ABNORMAL LOW (ref 8.9–10.3)
Chloride: 102 mmol/L (ref 98–111)
Creatinine, Ser: 7.93 mg/dL — ABNORMAL HIGH (ref 0.61–1.24)
GFR, Estimated: 7 mL/min — ABNORMAL LOW (ref >60)
Glucose, Bld: 93 mg/dL (ref 70–99)
Potassium: 4 mmol/L (ref 3.5–5.1)
Sodium: 136 mmol/L (ref 135–145)

## 2023-07-14 LAB — KAPPA/LAMBDA LIGHT CHAINS
Kappa free light chain: 98.4 mg/L — ABNORMAL HIGH (ref 3.3–19.4)
Kappa, lambda light chain ratio: 1.71 — ABNORMAL HIGH (ref 0.26–1.65)
Lambda free light chains: 57.5 mg/L — ABNORMAL HIGH (ref 5.7–26.3)

## 2023-07-14 LAB — MAGNESIUM: Magnesium: 2.1 mg/dL (ref 1.7–2.4)

## 2023-07-14 LAB — GLOMERULAR BASEMENT MEMBRANE ANTIBODIES: GBM Ab: <0.2 U (ref 0.0–0.9)

## 2023-07-14 MED ORDER — ALTEPLASE 2 MG IJ SOLR: 2.0000 mg | Freq: Once | INTRAMUSCULAR | Status: DC | PRN

## 2023-07-14 MED ORDER — HEPARIN SODIUM (PORCINE) 1000 UNIT/ML DIALYSIS: 1000.0000 [IU] | INTRAMUSCULAR | Status: DC | PRN

## 2023-07-14 MED ORDER — ANTICOAGULANT SODIUM CITRATE 4% (200MG/5ML) IV SOLN: 5.0000 mL | Status: DC | PRN

## 2023-07-14 MED ORDER — POLYETHYLENE GLYCOL 3350 17 G PO PACK
34.0000 g | PACK | Freq: Two times a day (BID) | ORAL | Status: AC
  Administered 2023-07-14 – 2023-07-16 (×5): 34 g via ORAL
  Filled 2023-07-14 (×4): qty 2

## 2023-07-14 MED ORDER — HEPARIN SODIUM (PORCINE) 1000 UNIT/ML IJ SOLN
INTRAMUSCULAR | Status: AC
  Filled 2023-07-14: qty 10

## 2023-07-14 NOTE — Progress Notes (Signed)
  PROGRESS NOTE    [name]  MVH:[id number] DOB: [birth year] DOA: 07/09/2023 PCP: Sallee Provencal, FNP  206A/206A-AA  LOS: 5 days   Brief hospital course:   Assessment & Plan: [name] is a 73 y.o. 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    * AKI (acute kidney injury) (HCC) High anion gap metabolic acidosis Suspect secondary to ibuprofen use, possibly recent lisinopril.  Oliguric.   --nephrology consulted, started on dialysis on 3/1 Plan: --iHD per nephro --workup for cause of AKI per nephro  Anemia Thrombocytopenia Hemoglobin 12 down from 14 just a couple weeks prior Platelet 76,000, down from 155 Etiology uncertain, possibly related to kidney disease, bone marrow suppression, hemolytic uremic syndrome, other --monitor  Frequent falls Generalized weakness Vitamin B12 deficiency  --PT/OT rec SNF rehab  diabetic polyneuropathy Complex regional pain syndrome of both lower extremities --taper down home gabapentin due to kidney fxn decline  COPD (chronic obstructive pulmonary disease) (HCC) Chronic respiratory failure with hypoxia Not acutely exacerbated  Alcohol use disorder Thiamine folate and multivitamin  Dementia without behavioral disturbance (HCC) Hypoactive delirium --developed more confusion and lethargy on 3/2 --cont sitter --West haldol PRN  Type 2 diabetes mellitus without complication, without long-term current use of insulin (HCC) --no need for BG checks and SSI  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  Followed by oncology and ENT  History of  hepatitis C --hep C RNA pending  Tobacco use disorder Nicotine patch  Essential hypertension Blood pressure varied --hold BP meds  Seizure disorder (HCC) Not on specific AEDs   DVT prophylaxis: Heparin SQ Code Status: DNR  Family Communication:  Level of care: Med-Surg Dispo:   The patient is from: home Anticipated d/c is to: SNF rehab Anticipated d/c date is: > 3 days   Subjective and Interval History:  Responding appropriately today.  Reported no abdominal pain.  Said he hasn't had a BM since presentation.   Objective: Vitals:   07/14/23 1100 07/14/23 1130 07/14/23 1155 07/14/23 1214  BP: 138/69 (!) 142/82 (!) 152/71   Pulse: 80 67 82   Resp: 17 12 13    Temp:   97.9 F (36.6 C)   TempSrc:   Oral   SpO2: 91% 91% 94%   Weight:    94 kg  Height:        Intake/Output Summary (Last 24 hours) at 07/14/2023 1724 Last data filed at 07/14/2023 1300 Gross per 24 hour  Intake --  Output 1375 ml  Net -1375 ml   Filed Weights   07/12/23 1005 07/14/23 0855 07/14/23 1214  Weight: 97.3 kg 94.2 kg 94 kg    Examination:   Constitutional: NAD, AAOx3 HEENT: conjunctivae and lids normal, EOMI CV: No cyanosis.   RESP: normal respiratory effort, on RA Neuro: II - XII grossly intact.     Data Reviewed: I have personally reviewed labs and imaging studies  Time spent: 35 minutes  Darlin Priestly, MD [facility] If 7PM-7AM, please contact night-coverage 07/14/2023, 5:24 PM

## 2023-07-14 NOTE — Plan of Care (Signed)

## 2023-07-14 NOTE — Progress Notes (Signed)
 PT Cancellation Note  Patient Details Name: [name] MRN: [id number] DOB: 21-Nov-1950   Cancelled Treatment:    Reason Eval/Treat Not Completed: Patient at procedure or test/unavailable (Pt OTF for HD today. Will attempt PT again at later date/time.)  1:21 PM, 07/14/23 Rosamaria Lints, PT, DPT Physical Therapist - [facility]  [phone] (ASCOM)    [name] 07/14/2023, 1:21 PM

## 2023-07-14 NOTE — Progress Notes (Signed)
 [facility]  ROUNDING NOTE   Subjective:   Patient seen and evaluated during dialysis   HEMODIALYSIS FLOWSHEET:  Blood Flow Rate (mL/min): 199 mL/min Arterial Pressure (mmHg): -95.75 mmHg Venous Pressure (mmHg): 78.98 mmHg TMP (mmHg): 14.75 mmHg Ultrafiltration Rate (mL/min): 500 mL/min Dialysate Flow Rate (mL/min): 299 ml/min  Confused, safety sitter at bedside Requires redirection, pulling at lines.   Objective:  Vital signs in last 24 hours:  Temp:  [96 F (35.6 C)-98 F (36.7 C)] 97.9 F (36.6 C) (03/03 1155) Pulse Rate:  [60-82] 82 (03/03 1155) Resp:  [12-18] 13 (03/03 1155) BP: (93-157)/(55-84) 152/71 (03/03 1155) SpO2:  [88 %-99 %] 94 % (03/03 1155) Weight:  [94 kg-94.2 kg] 94 kg (03/03 1214)  Weight change:  Filed Weights   07/12/23 1005 07/14/23 0855 07/14/23 1214  Weight: 97.3 kg 94.2 kg 94 kg    Intake/Output: I/O last 3 completed shifts: In: 2880.2 [P.O.:120; I.V.:2760.2] Out: 1900 [Urine:1900]   Intake/Output this shift:  Total I/O In: -  Out: 300 [Other:300]  Physical Exam: General: NAD  Head: Normocephalic, atraumatic. Moist oral mucosal membranes  Eyes: Anicteric  Lungs:  Clear to auscultation, normal effort  Heart: Regular rate and rhythm  Abdomen:  Soft, nontender, distention  Extremities: No peripheral edema.  Neurologic: Alert, moving all four extremities  Skin: No lesions  Access: None    Basic Metabolic Panel: Recent Labs  Lab 07/10/23 0155 07/11/23 0438 07/12/23 0557 07/13/23 0722 07/14/23 0428  NA 128* 130* 133* 134* 136  K 3.4* 3.4* 4.1 4.0 4.0  CL 94* 96* 101 103 102  CO2 18* 19* 19* 19* 23  GLUCOSE 94 91 96 86 93  BUN 40* 48* 51* 40* 46*  CREATININE 8.52* 9.76* 10.00* 8.04* 7.93*  CALCIUM 7.6* 7.6* 7.9* 8.2* 8.6*  MG  --  2.0 2.0 1.9 2.1  PHOS  --  5.1* 5.7*  --   --     Liver Function Tests: Recent Labs  Lab 07/09/23 1916  AST 48*  ALT 20  ALKPHOS 64  BILITOT 1.1  PROT 7.2  ALBUMIN 3.8    No results for input(s): "LIPASE", "AMYLASE" in the last 168 hours. No results for input(s): "AMMONIA" in the last 168 hours.  CBC: Recent Labs  Lab 07/09/23 1916 07/10/23 0155 07/12/23 0852 07/14/23 0906  WBC 6.9  6.8 6.0 5.0 3.1*  NEUTROABS 5.5  --   --   --   HGB 12.4*  12.5* 12.0* 11.3* 10.9*  HCT 36.7*  36.9* 35.1* 33.8* 32.2*  MCV 94.6  96.3 95.4 94.4 92.8  PLT 79*  76* 68* 71* 88*    Cardiac Enzymes: No results for input(s): "CKTOTAL", "CKMB", "CKMBINDEX", "TROPONINI" in the last 168 hours.  BNP: Invalid input(s): "POCBNP"  CBG: Recent Labs  Lab 07/11/23 1700 07/11/23 2035 07/12/23 1118 07/13/23 0807 07/13/23 1126  GLUCAP 92 99 94 77 165*    Microbiology: Results for orders placed or performed during the hospital encounter of 02/15/23  Resp panel by RT-PCR (RSV, Flu A&B, Covid) Anterior Nasal Swab     Status: None   Collection Time: 02/15/23  3:41 PM   Specimen: Anterior Nasal Swab  Result Value Ref Range Status   SARS Coronavirus 2 by RT PCR NEGATIVE NEGATIVE Final    Comment: (NOTE) SARS-CoV-2 target nucleic acids are NOT DETECTED.  The SARS-CoV-2 RNA is generally detectable in upper respiratory specimens during the acute phase of infection. The lowest concentration of SARS-CoV-2 viral copies this assay  can detect is 138 copies/mL. A negative result does not preclude SARS-Cov-2 infection and should not be used as the sole basis for treatment or other patient management decisions. A negative result may occur with  improper specimen collection/handling, submission of specimen other than nasopharyngeal swab, presence of viral mutation(s) within the areas targeted by this assay, and inadequate number of viral copies(<138 copies/mL). A negative result must be combined with clinical observations, patient history, and epidemiological information. The expected result is Negative.  Fact Sheet for Patients:   BloggerCourse.com  Fact Sheet for Healthcare Providers:  SeriousBroker.it  This test is no t yet approved or cleared by the [place] FDA and  has been authorized for detection and/or diagnosis of SARS-CoV-2 by FDA under an Emergency Use Authorization (EUA). This EUA will remain  in effect (meaning this test can be used) for the duration of the COVID-19 declaration under Section 564(b)(1) of the Act, 21 U.S.C.section 360bbb-3(b)(1), unless the authorization is terminated  or revoked sooner.       Influenza A by PCR NEGATIVE NEGATIVE Final   Influenza B by PCR NEGATIVE NEGATIVE Final    Comment: (NOTE) The Xpert Xpress SARS-CoV-2/FLU/RSV plus assay is intended as an aid in the diagnosis of influenza from Nasopharyngeal swab specimens and should not be used as a sole basis for treatment. Nasal washings and aspirates are unacceptable for Xpert Xpress SARS-CoV-2/FLU/RSV testing.  Fact Sheet for Patients: BloggerCourse.com  Fact Sheet for Healthcare Providers: SeriousBroker.it  This test is not yet approved or cleared by the [place] FDA and has been authorized for detection and/or diagnosis of SARS-CoV-2 by FDA under an Emergency Use Authorization (EUA). This EUA will remain in effect (meaning this test can be used) for the duration of the COVID-19 declaration under Section 564(b)(1) of the Act, 21 U.S.C. section 360bbb-3(b)(1), unless the authorization is terminated or revoked.     Resp Syncytial Virus by PCR NEGATIVE NEGATIVE Final    Comment: (NOTE) Fact Sheet for Patients: BloggerCourse.com  Fact Sheet for Healthcare Providers: SeriousBroker.it  This test is not yet approved or cleared by the [place] FDA and has been authorized for detection and/or diagnosis of SARS-CoV-2 by FDA under an Emergency Use  Authorization (EUA). This EUA will remain in effect (meaning this test can be used) for the duration of the COVID-19 declaration under Section 564(b)(1) of the Act, 21 U.S.C. section 360bbb-3(b)(1), unless the authorization is terminated or revoked.  Performed at [facility], [address] [place], [place] [address]   Blood Culture (routine x 2)     Status: None   Collection Time: 02/15/23  7:44 PM   Specimen: Right Antecubital; Blood  Result Value Ref Range Status   Specimen Description RIGHT ANTECUBITAL  Final   Special Requests   Final    BOTTLES DRAWN AEROBIC AND ANAEROBIC Blood Culture adequate volume   Culture   Final    NO GROWTH 5 DAYS Performed at [facility], [address], [place], [place] [address]    Report Status 02/20/2023 FINAL  Final  Blood Culture (routine x 2)     Status: None   Collection Time: 02/16/23  4:17 PM   Specimen: BLOOD  Result Value Ref Range Status   Specimen Description BLOOD BLOOD RIGHT ARM  Final   Special Requests   Final    BOTTLES DRAWN AEROBIC AND ANAEROBIC Blood Culture adequate volume   Culture   Final    NO GROWTH 5 DAYS Performed at  [facility], [address] [place], [place] [address]    Report Status 02/21/2023 FINAL  Final    Coagulation Studies: No results for input(s): "LABPROT", "INR" in the last 72 hours.   Urinalysis: Recent Labs    07/12/23 1104  COLORURINE YELLOW*  LABSPEC 1.005  PHURINE 5.0  GLUCOSEU NEGATIVE  HGBUR MODERATE*  BILIRUBINUR NEGATIVE  KETONESUR NEGATIVE  PROTEINUR 30*  NITRITE NEGATIVE  LEUKOCYTESUR NEGATIVE      Imaging: No results found.    Medications:    anticoagulant sodium citrate      amitriptyline  75 mg Oral QHS   aspirin EC  81 mg Oral Daily   Chlorhexidine Gluconate Cloth  6 each Topical Q0600   Chlorhexidine Gluconate Cloth  6 each Topical Q0600   folic acid  1 mg Oral Daily   gabapentin  200 mg Oral Q1200   heparin injection  (subcutaneous)  5,000 Units Subcutaneous Q8H   multivitamin with minerals  1 tablet Oral Daily   nicotine  14 mg Transdermal Daily   rosuvastatin  40 mg Oral Daily   thiamine  100 mg Oral Daily   acetaminophen **OR** acetaminophen, albuterol, alteplase, anticoagulant sodium citrate, haloperidol lactate, heparin, HYDROcodone-acetaminophen, LORazepam **OR** LORazepam, ondansetron **OR** ondansetron (ZOFRAN) IV, mouth rinse  Assessment/ Plan:  [name] IV is a 73 y.o.  male with past medical conditions including alcohol use disorder, COPD with 2 L nasal cannula, diet-controlled diabetes, hepatitis C, seizure disorder, hypertension, and vitamin B12 deficiency, who was admitted to [facility] on 07/09/2023 for Thrombocytopenia (HCC) [D69.6] AKI (acute kidney injury) (HCC) [N17.9] Frequent falls [R29.6] Increased anion gap metabolic acidosis [E87.29] Injury of head, initial encounter [S09.90XA]   Acute kidney injury likely secondary to dehydration with NSAID use. Normal renal function noted 2 weeks prior. Renal ultrasound negative for hydronephrosis. No recent contrast exposure. Continue holding ibuprofen and lisinopril.  Serologies pending. Receiving second dialysis treatment today. Next treatment scheduled for Tuesday and then will hold to determine renal recovery. Has made adequate urine output.  Lab Results  Component Value Date   CREATININE 7.93 (H) 07/14/2023   CREATININE 8.04 (H) 07/13/2023   CREATININE 10.00 (H) 07/12/2023    Intake/Output Summary (Last 24 hours) at 07/14/2023 1227 Last data filed at 07/14/2023 1155 Gross per 24 hour  Intake 0 ml  Output 1100 ml  Net -1100 ml   2.  Hypertension, essential.  Home regimen includes amlodipine and lisinopril.  Both currently held.   Blood pressure 134/79 during dialysis.    3. Diabetes mellitus type II, renal manifestations: noninsulin dependent. Most recent hemoglobin A1c is 6.1 on 06/25/23.  Diet controlled  4. Anemia with  thrombocytopenia Lab Results  Component Value Date   HGB 10.9 (L) 07/14/2023    Hemoglobin at goal.  .   LOS: 5 Antrice Pal 3/3/202512:27 PM

## 2023-07-14 NOTE — TOC Progression Note (Addendum)
 Transition of Care [facility]) - Progression Note    Patient Details  Name: [name] MRN: [id number] Date of Birth: 06-11-1950  Transition of Care [facility]) CM/SW Contact  Chapman Fitch, RN Phone Number: 07/14/2023, 2:44 PM  Clinical Narrative:      Patient A&Ox2 with safety sitter in place Call  placed to sister Thurston Hole to discuss bed offers.  She accepted bed at [facility].  Accepted in HUB, and notified Debra at [facility]    Expected Discharge Plan and Services                                               Social Determinants of Health (SDOH) Interventions SDOH Screenings   Food Insecurity: No Food Insecurity (07/10/2023)  Housing: Low Risk  (07/10/2023)  Transportation Needs: No Transportation Needs (07/10/2023)  Utilities: Not At Risk (07/10/2023)  Alcohol Screen: Low Risk  (10/28/2022)  Depression (PHQ2-9): Low Risk  (06/25/2023)  Financial Resource Strain: Low Risk  (10/22/2022)  Physical Activity: Inactive (10/22/2022)  Social Connections: Socially Isolated (07/10/2023)  Stress: Stress Concern Present (01/29/2023)  Tobacco Use: Medium Risk (07/09/2023)    Readmission Risk Interventions     No data to display

## 2023-07-14 NOTE — Progress Notes (Signed)
  Received patient in bed to unit.   Informed consent signed and in chart.    TX duration: 2.5hrs     Transported back to floor  Hand-off given to patient's nurse. No c/o and no distress noted    Access used: R HD Femoral catheter Access issues: lines reversed    Total UF removed: 0.72ml Medication(s) given: none Post HD VS: WNL Post HD weight: 94.0kg     Lynann Beaver  [facility]

## 2023-07-15 DIAGNOSIS — N179 Acute kidney failure, unspecified: Secondary | ICD-10-CM | POA: Diagnosis not present

## 2023-07-15 LAB — BASIC METABOLIC PANEL
Anion gap: 14 (ref 5–15)
BUN: 33 mg/dL — ABNORMAL HIGH (ref 8–23)
CO2: 23 mmol/L (ref 22–32)
Calcium: 8.6 mg/dL — ABNORMAL LOW (ref 8.9–10.3)
Chloride: 99 mmol/L (ref 98–111)
Creatinine, Ser: 6.15 mg/dL — ABNORMAL HIGH (ref 0.61–1.24)
GFR, Estimated: 9 mL/min — ABNORMAL LOW (ref >60)
Glucose, Bld: 92 mg/dL (ref 70–99)
Potassium: 3.3 mmol/L — ABNORMAL LOW (ref 3.5–5.1)
Sodium: 136 mmol/L (ref 135–145)

## 2023-07-15 LAB — CBC
HCT: 31.3 % — ABNORMAL LOW (ref 39.0–52.0)
Hemoglobin: 10.8 g/dL — ABNORMAL LOW (ref 13.0–17.0)
MCH: 32.6 pg (ref 26.0–34.0)
MCHC: 34.5 g/dL (ref 30.0–36.0)
MCV: 94.6 fL (ref 80.0–100.0)
Platelets: 96 10*3/uL — ABNORMAL LOW (ref 150–400)
RBC: 3.31 MIL/uL — ABNORMAL LOW (ref 4.22–5.81)
RDW: 15.9 % — ABNORMAL HIGH (ref 11.5–15.5)
WBC: 6.3 10*3/uL (ref 4.0–10.5)
nRBC: 0 % (ref 0.0–0.2)

## 2023-07-15 LAB — GLUCOSE, CAPILLARY: Glucose-Capillary: 94 mg/dL (ref 70–99)

## 2023-07-15 LAB — MAGNESIUM: Magnesium: 1.9 mg/dL (ref 1.7–2.4)

## 2023-07-15 MED ORDER — ALTEPLASE 2 MG IJ SOLR: 2.0000 mg | Freq: Once | INTRAMUSCULAR | Status: DC | PRN

## 2023-07-15 MED ORDER — HEPARIN SODIUM (PORCINE) 1000 UNIT/ML DIALYSIS: 1000.0000 [IU] | INTRAMUSCULAR | Status: DC | PRN

## 2023-07-15 MED ORDER — QUETIAPINE FUMARATE 25 MG PO TABS
50.0000 mg | ORAL_TABLET | Freq: Every day | ORAL | Status: DC
  Administered 2023-07-15 – 2023-07-21 (×7): 50 mg via ORAL
  Filled 2023-07-15 (×7): qty 2

## 2023-07-15 NOTE — Progress Notes (Signed)
 PT Cancellation Note  Patient Details Name: [name] MRN: [id number] DOB: 1950/11/26   Cancelled Treatment:    Reason Eval/Treat Not Completed: Patient at procedure or test/unavailable Patient off unit at HD. Will re-attempt at later date/time when available.   Maylon Peppers, PT, DPT Physical Therapist - [facility]  [facility]    Edilberto Roosevelt A Dariush Mcnellis 07/15/2023, 9:58 AM

## 2023-07-15 NOTE — Progress Notes (Signed)
 PROGRESS NOTE    [name] West  ZOX:[id number] DOB: Nov 28, 1950 DOA: 07/09/2023 PCP: Sallee Provencal, FNP  206A/206A-AA  LOS: 6 days   Brief hospital course:   Assessment & Plan: [name] is a 73 y.o. 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    * AKI (acute kidney injury) (HCC), now needing dialysis High anion gap metabolic acidosis --unclear etiology.  Pos ibuprofen use, recent lisinopril.  Oliguric.   --nephrology consulted, started on dialysis on 3/1 Plan: --iHD per nephro --workup for cause of AKI per nephro --nephro now looking at outpatient dialysis chair --need to arrange for PermCath placement  Anemia Thrombocytopenia Hemoglobin 12 down from 14 just a couple weeks prior Platelet 76,000, down from 155 Etiology uncertain, possibly related to kidney disease, bone marrow suppression, hemolytic uremic syndrome, other --monitor  Frequent falls Generalized weakness Vitamin B12 deficiency  --PT/OT rec SNF rehab  diabetic polyneuropathy Complex regional pain syndrome of both lower extremities --taper down home gabapentin due to kidney fxn decline  COPD (chronic obstructive pulmonary disease) (HCC) Chronic respiratory failure with hypoxia Not acutely exacerbated  Alcohol use disorder Thiamine folate and multivitamin  Dementia without behavioral disturbance (HCC) Hypoactive delirium --developed more confusion and lethargy on 3/2 --cont sitter --start seroquel 50 mg nightly, can titrate up  Type 2 diabetes mellitus without complication, without long-term current use of insulin (HCC) --no need for BG checks and SSI  Nasopharyngeal carcinoma (HCC) history of  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  Followed by oncology and ENT  History of hepatitis C --hep C RNA pending  Tobacco use disorder Nicotine patch  Essential hypertension Blood pressure varied --hold BP meds  Seizure disorder (HCC) Not on specific AEDs  Fever --100.8 early morning of 3/4 --blood cx, resp panel   DVT prophylaxis: Heparin SQ Code Status: DNR  Family Communication:  Level of care: Med-Surg Dispo:   The patient is from: home Anticipated d/c is to: SNF rehab Anticipated d/c date is: > 3 days, need Permcath and outpatient dialysis set up   Subjective and Interval History:  Pt was lethargic today, denied dyspnea, no cough.  RN reported pt still needed sitter due to agitation and pulling at lines.   Objective: Vitals:   07/15/23 1159 07/15/23 1413 07/15/23 1640 07/15/23 1949  BP: (!) 142/78 138/65 (!) 144/75 136/77  Pulse: 85 85 82 73  Resp: 15 14 16 16   Temp:  (!) 97.3 F (36.3 C) 98.1 F (36.7 C) 98.3 F (36.8 C)  TempSrc:      SpO2: 100% 97% 97% 94%  Weight: 93.2 kg     Height:        Intake/Output Summary (Last 24 hours) at 07/15/2023 2058 Last data filed at 07/15/2023 1341 Gross per 24 hour  Intake --  Output 900 ml  Net -900 ml   Filed Weights   07/14/23 1214 07/15/23 0836 07/15/23 1159  Weight: 94 kg 93.2 kg 93.2 kg    Examination:   Constitutional: NAD, sleeping but arousable HEENT: conjunctivae and lids normal, EOMI CV: No cyanosis.   RESP: normal respiratory effort   Data Reviewed: I have personally reviewed labs and imaging studies  Time spent: 50  minutes  Darlin Priestly, MD [facility] If 7PM-7AM, please contact night-coverage 07/15/2023, 8:58 PM

## 2023-07-15 NOTE — TOC Progression Note (Signed)
 Transition of Care [facility]) - Progression Note    Patient Details  Name: [name] MRN: [id number] Date of Birth: Nov 06, 1950  [facility] CM/SW Contact  Chapman Fitch, RN Phone Number: 07/15/2023, 1:58 PM  Clinical Narrative:     Outpatient HD placement pending. Per MD not ready for auth for SNF to be initiated        Expected Discharge Plan and Services                                               Social Determinants of Health (SDOH) Interventions SDOH Screenings   Food Insecurity: No Food Insecurity (07/10/2023)  Housing: Low Risk  (07/10/2023)  Transportation Needs: No Transportation Needs (07/10/2023)  Utilities: Not At Risk (07/10/2023)  Alcohol Screen: Low Risk  (10/28/2022)  Depression (PHQ2-9): Low Risk  (06/25/2023)  Financial Resource Strain: Low Risk  (10/22/2022)  Physical Activity: Inactive (10/22/2022)  Social Connections: Socially Isolated (07/10/2023)  Stress: Stress Concern Present (01/29/2023)  Tobacco Use: Medium Risk (07/09/2023)    Readmission Risk Interventions     No data to display

## 2023-07-15 NOTE — Progress Notes (Signed)
 [facility]  ROUNDING NOTE   Subjective:   Patient seen and evaluated during dialysis   HEMODIALYSIS FLOWSHEET:  Blood Flow Rate (mL/min): 0 mL/min Arterial Pressure (mmHg): 39.19 mmHg Venous Pressure (mmHg): -31.31 mmHg TMP (mmHg): 15.15 mmHg Ultrafiltration Rate (mL/min): 434 mL/min Dialysate Flow Rate (mL/min): 299 ml/min  Remains confused, mitts on   Objective:  Vital signs in last 24 hours:  Temp:  [97.6 F (36.4 C)-100.8 F (38.2 C)] 97.9 F (36.6 C) (03/04 1157) Pulse Rate:  [81-99] 85 (03/04 1159) Resp:  [13-21] 15 (03/04 1159) BP: (107-173)/(59-92) 142/78 (03/04 1159) SpO2:  [91 %-100 %] 100 % (03/04 1159) Weight:  [93.2 kg] 93.2 kg (03/04 1159)  Weight change:  Filed Weights   07/14/23 1214 07/15/23 0836 07/15/23 1159  Weight: 94 kg 93.2 kg 93.2 kg    Intake/Output: I/O last 3 completed shifts: In: -  Out: 2575 [Urine:2275; Other:300]   Intake/Output this shift:  Total I/O In: -  Out: 150 [Urine:150]  Physical Exam: General: NAD  Head: Normocephalic, atraumatic. Moist oral mucosal membranes  Eyes: Anicteric  Lungs:  Clear to auscultation, normal effort  Heart: Regular rate and rhythm  Abdomen:  Soft, nontender, distention  Extremities: No peripheral edema.  Neurologic: Somnolent, moving all four extremities  Skin: No lesions  Access: None    Basic Metabolic Panel: Recent Labs  Lab 07/11/23 0438 07/12/23 0557 07/13/23 0722 07/14/23 0428 07/15/23 0411  NA 130* 133* 134* 136 136  K 3.4* 4.1 4.0 4.0 3.3*  CL 96* 101 103 102 99  CO2 19* 19* 19* 23 23  GLUCOSE 91 96 86 93 92  BUN 48* 51* 40* 46* 33*  CREATININE 9.76* 10.00* 8.04* 7.93* 6.15*  CALCIUM 7.6* 7.9* 8.2* 8.6* 8.6*  MG 2.0 2.0 1.9 2.1 1.9  PHOS 5.1* 5.7*  --   --   --     Liver Function Tests: Recent Labs  Lab 07/09/23 1916  AST 48*  ALT 20  ALKPHOS 64  BILITOT 1.1  PROT 7.2  ALBUMIN 3.8   No results for input(s): "LIPASE", "AMYLASE" in the last 168  hours. No results for input(s): "AMMONIA" in the last 168 hours.  CBC: Recent Labs  Lab 07/09/23 1916 07/10/23 0155 07/12/23 0852 07/14/23 0906 07/15/23 0853  WBC 6.9  6.8 6.0 5.0 3.1* 6.3  NEUTROABS 5.5  --   --   --   --   HGB 12.4*  12.5* 12.0* 11.3* 10.9* 10.8*  HCT 36.7*  36.9* 35.1* 33.8* 32.2* 31.3*  MCV 94.6  96.3 95.4 94.4 92.8 94.6  PLT 79*  76* 68* 71* 88* 96*    Cardiac Enzymes: No results for input(s): "CKTOTAL", "CKMB", "CKMBINDEX", "TROPONINI" in the last 168 hours.  BNP: Invalid input(s): "POCBNP"  CBG: Recent Labs  Lab 07/11/23 1700 07/11/23 2035 07/12/23 1118 07/13/23 0807 07/13/23 1126  GLUCAP 92 99 94 77 165*    Microbiology: Results for orders placed or performed during the hospital encounter of 02/15/23  Resp panel by RT-PCR (RSV, Flu A&B, Covid) Anterior Nasal Swab     Status: None   Collection Time: 02/15/23  3:41 PM   Specimen: Anterior Nasal Swab  Result Value Ref Range Status   SARS Coronavirus 2 by RT PCR NEGATIVE NEGATIVE Final    Comment: (NOTE) SARS-CoV-2 target nucleic acids are NOT DETECTED.  The SARS-CoV-2 RNA is generally detectable in upper respiratory specimens during the acute phase of infection. The lowest concentration of SARS-CoV-2 viral copies this  assay can detect is 138 copies/mL. A negative result does not preclude SARS-Cov-2 infection and should not be used as the sole basis for treatment or other patient management decisions. A negative result may occur with  improper specimen collection/handling, submission of specimen other than nasopharyngeal swab, presence of viral mutation(s) within the areas targeted by this assay, and inadequate number of viral copies(<138 copies/mL). A negative result must be combined with clinical observations, patient history, and epidemiological information. The expected result is Negative.  Fact Sheet for Patients:  BloggerCourse.com  Fact Sheet for  Healthcare Providers:  SeriousBroker.it  This test is no t yet approved or cleared by the [place] FDA and  has been authorized for detection and/or diagnosis of SARS-CoV-2 by FDA under an Emergency Use Authorization (EUA). This EUA will remain  in effect (meaning this test can be used) for the duration of the COVID-19 declaration under Section 564(b)(1) of the Act, 21 U.S.C.section 360bbb-3(b)(1), unless the authorization is terminated  or revoked sooner.       Influenza A by PCR NEGATIVE NEGATIVE Final   Influenza B by PCR NEGATIVE NEGATIVE Final    Comment: (NOTE) The Xpert Xpress SARS-CoV-2/FLU/RSV plus assay is intended as an aid in the diagnosis of influenza from Nasopharyngeal swab specimens and should not be used as a sole basis for treatment. Nasal washings and aspirates are unacceptable for Xpert Xpress SARS-CoV-2/FLU/RSV testing.  Fact Sheet for Patients: BloggerCourse.com  Fact Sheet for Healthcare Providers: SeriousBroker.it  This test is not yet approved or cleared by the [place] FDA and has been authorized for detection and/or diagnosis of SARS-CoV-2 by FDA under an Emergency Use Authorization (EUA). This EUA will remain in effect (meaning this test can be used) for the duration of the COVID-19 declaration under Section 564(b)(1) of the Act, 21 U.S.C. section 360bbb-3(b)(1), unless the authorization is terminated or revoked.     Resp Syncytial Virus by PCR NEGATIVE NEGATIVE Final    Comment: (NOTE) Fact Sheet for Patients: BloggerCourse.com  Fact Sheet for Healthcare Providers: SeriousBroker.it  This test is not yet approved or cleared by the [place] FDA and has been authorized for detection and/or diagnosis of SARS-CoV-2 by FDA under an Emergency Use Authorization (EUA). This EUA will remain in effect (meaning this  test can be used) for the duration of the COVID-19 declaration under Section 564(b)(1) of the Act, 21 U.S.C. section 360bbb-3(b)(1), unless the authorization is terminated or revoked.  Performed at [facility], [address] [place], [place] [address]   Blood Culture (routine x 2)     Status: None   Collection Time: 02/15/23  7:44 PM   Specimen: Right Antecubital; Blood  Result Value Ref Range Status   Specimen Description RIGHT ANTECUBITAL  Final   Special Requests   Final    BOTTLES DRAWN AEROBIC AND ANAEROBIC Blood Culture adequate volume   Culture   Final    NO GROWTH 5 DAYS Performed at [facility], [address], [place], [place] [address]    Report Status 02/20/2023 FINAL  Final  Blood Culture (routine x 2)     Status: None   Collection Time: 02/16/23  4:17 PM   Specimen: BLOOD  Result Value Ref Range Status   Specimen Description BLOOD BLOOD RIGHT ARM  Final   Special Requests   Final    BOTTLES DRAWN AEROBIC AND ANAEROBIC Blood Culture adequate volume   Culture   Final    NO GROWTH 5 DAYS Performed  at [facility], [address] [place], [place] [address]    Report Status 02/21/2023 FINAL  Final    Coagulation Studies: No results for input(s): "LABPROT", "INR" in the last 72 hours.   Urinalysis: No results for input(s): "COLORURINE", "LABSPEC", "PHURINE", "GLUCOSEU", "HGBUR", "BILIRUBINUR", "KETONESUR", "PROTEINUR", "UROBILINOGEN", "NITRITE", "LEUKOCYTESUR" in the last 72 hours.  Invalid input(s): "APPERANCEUR"     Imaging: No results found.    Medications:      amitriptyline  75 mg Oral QHS   aspirin EC  81 mg Oral Daily   Chlorhexidine Gluconate Cloth  6 each Topical Q0600   Chlorhexidine Gluconate Cloth  6 each Topical Q0600   folic acid  1 mg Oral Daily   heparin injection (subcutaneous)  5,000 Units Subcutaneous Q8H   multivitamin with minerals  1 tablet Oral Daily   nicotine  14 mg Transdermal Daily    polyethylene glycol  34 g Oral BID   rosuvastatin  40 mg Oral Daily   thiamine  100 mg Oral Daily   acetaminophen **OR** acetaminophen, albuterol, haloperidol lactate, HYDROcodone-acetaminophen, LORazepam **OR** LORazepam, ondansetron **OR** ondansetron (ZOFRAN) IV, mouth rinse  Assessment/ Plan:  Mr. FAIRLEY COPHER IV is a 73 y.o.  male with past medical conditions including alcohol use disorder, COPD with 2 L nasal cannula, diet-controlled diabetes, hepatitis C, seizure disorder, hypertension, and vitamin B12 deficiency, who was admitted to [facility] on 07/09/2023 for Thrombocytopenia (HCC) [D69.6] AKI (acute kidney injury) (HCC) [N17.9] Frequent falls [R29.6] Increased anion gap metabolic acidosis [E87.29] Injury of head, initial encounter [S09.90XA]   Acute kidney injury likely secondary to dehydration with NSAID use. Normal renal function noted 2 weeks prior. Renal ultrasound negative for hydronephrosis. No recent contrast exposure. Continue holding ibuprofen and lisinopril.  Autoimmune and complements within desired range. Kappa lambda ratio elevated.  Received third treatment today, no UF due to adequate urine output.   Vascular surgery to place permcath later this week.   Next treatment scheduled for Thursday  Renal navigator aware of patient, outpatient clinic search underway.    Lab Results  Component Value Date   CREATININE 6.15 (H) 07/15/2023   CREATININE 7.93 (H) 07/14/2023   CREATININE 8.04 (H) 07/13/2023    Intake/Output Summary (Last 24 hours) at 07/15/2023 1250 Last data filed at 07/15/2023 1159 Gross per 24 hour  Intake --  Output 2000 ml  Net -2000 ml   2.  Hypertension, essential.  Home regimen includes amlodipine and lisinopril.  Both currently held.   Blood pressure 142/78 during dialysis.    3. Diabetes mellitus type II, renal manifestations: noninsulin dependent. Most recent hemoglobin A1c is 6.1 on 06/25/23.    4. Anemia with thrombocytopenia Lab Results   Component Value Date   HGB 10.8 (L) 07/15/2023    Hemoglobin within desired goal.   LOS: 6 [name] 3/4/202512:50 PM

## 2023-07-15 NOTE — Progress Notes (Addendum)
 Physical Therapy Treatment Patient Details Name: [name] MRN: [id number] DOB: 01-Jun-1950 Today's Date: 07/15/2023   History of Present Illness 73 y.o. 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  He feels like his knees just gives out and he falls.  He hit his head on 1 occasion but did not lose consciousness.    PT Comments  Pt was long sitting in bed upon arrival. He was on 2 L o2 Cricket however removed throughout session with sao2 > 91%. Safety sitter in room upon arrival and endorses that pt did not eat breakfast or lunch today. Author encouraged increased intake for strengthening. Pt is alert, cooperative, and actually pleasant during session. He demonstrated safe abilities to exit bed and stand to RW however does remain extremely high fall risk. Pt attempted gait without +1 UE support only and has severe LOB. HIGHLY RECOMMEND use of RW at all times until balance and safe functional mobility improves. Due to pt's high likelihood of falls, DC recs remain appropriate to maximize pt's safe functional mobility while improve balance during all ADLs.    If plan is discharge home, recommend the following: A little help with walking and/or transfers;A little help with bathing/dressing/bathroom;Assistance with cooking/housework;Assist for transportation     Equipment Recommendations  Rolling walker (2 wheels)       Precautions / Restrictions Precautions Precautions: Fall Restrictions Weight Bearing Restrictions Per Provider Order: No     Mobility  Bed Mobility Overal bed mobility: Needs Assistance Bed Mobility: Supine to Sit  Supine to sit: Contact guard  General bed mobility comments: CGA for  safety  Transfers Overall transfer level: Needs assistance Equipment used: Rolling walker (2 wheels) Transfers: Sit to/from Stand Sit to Stand: Contact guard assist  General transfer comment: CGA for safety with vsc for technique improvements    Ambulation/Gait Ambulation/Gait assistance: Contact guard assist, Mod assist Gait Distance (Feet): 100 Feet Assistive device: Rolling walker (2 wheels), None Gait Pattern/deviations: Step-through pattern  General Gait Details: Pt requires CGA for safety during ambulation with RW however atmpted gait with single UE support and pt was very unsteady and unsafe. Pt's HR elevated to 150 bpm with gait training. sao2 > 91% on rm air   Balance Overall balance assessment: Needs assistance Sitting-balance support: No upper extremity supported Sitting balance-Leahy Scale: Good     Standing balance support: Single extremity supported, During functional activity Standing balance-Leahy Scale: Poor Standing balance comment: pt is extremely high fall risk without BUE support         Cognition Arousal: Alert Behavior During Therapy: WFL for tasks assessed/performed   PT - Cognitive impairments: No family/caregiver present to determine baseline    PT - Cognition Comments: Pt is alert and cooperative. did have +1 Mitt on and safety sitter in place. no difficulty/impulsivity or behaviors observed this session. He remained calm and pleasant but disoriented to situation Following commands: Intact      Cueing Cueing Techniques: Verbal cues         Pertinent Vitals/Pain Pain Assessment Pain Assessment: No/denies pain     PT Goals (current goals can now be found in the care plan section) Acute Rehab PT Goals Patient Stated Goal: Go home Progress towards PT goals: Progressing toward goals  Frequency    Min 1X/week       AM-PAC PT "6 Clicks" Mobility   Outcome Measure  Help needed turning from your back to your side while in a flat bed  without using bedrails?: A Little Help needed moving from lying on your back to sitting on the side of a flat bed without using bedrails?: A Little Help needed moving to and from a bed to a chair (including a wheelchair)?: A Lot Help needed standing up from a chair using your arms (e.g., wheelchair or bedside chair)?: A Little Help needed to walk in hospital room?: A Little Help needed climbing 3-5 steps with a railing? : A Lot 6 Click Score: 16    End of Session   Activity Tolerance: Patient tolerated treatment well;Patient limited by fatigue Patient left: in bed;with call bell/phone within reach;with nursing/sitter in room Nurse Communication: Mobility status PT Visit Diagnosis: Unsteadiness on feet (R26.81);Repeated falls (R29.6);Muscle weakness (generalized) (M62.81);Difficulty in walking, not elsewhere classified (R26.2)     Time: 7829-5621 PT Time Calculation (min) (ACUTE ONLY): 14 min  Charges:    $Gait Training: 8-22 mins PT General Charges $$ ACUTE PT VISIT: 1 Visit                     Jetta Lout PTA 07/15/23, 3:07 PM

## 2023-07-15 NOTE — Progress Notes (Signed)
 Hemodialysis Note:  Received patient in bed to unit. Alert and oriented. Informed consent singed and in chart.  Treatment initiated: 0847 Treatment completed: 1157  Access used: Right femoral catheter Access issues: None  Patient tolerated well. Transported back to room, alert without acute distress. Report given to patient's RN.  Total UF removed: 0 Medications given: None  Post HD weight: 93.2 Kg  [facility]

## 2023-07-16 ENCOUNTER — Inpatient Hospital Stay

## 2023-07-16 DIAGNOSIS — E66811 Obesity, class 1: Secondary | ICD-10-CM | POA: Insufficient documentation

## 2023-07-16 DIAGNOSIS — N179 Acute kidney failure, unspecified: Secondary | ICD-10-CM | POA: Diagnosis not present

## 2023-07-16 DIAGNOSIS — E8729 Other acidosis: Secondary | ICD-10-CM

## 2023-07-16 DIAGNOSIS — E119 Type 2 diabetes mellitus without complications: Secondary | ICD-10-CM

## 2023-07-16 LAB — BASIC METABOLIC PANEL
Anion gap: 10 (ref 5–15)
BUN: 24 mg/dL — ABNORMAL HIGH (ref 8–23)
CO2: 25 mmol/L (ref 22–32)
Calcium: 8.9 mg/dL (ref 8.9–10.3)
Chloride: 101 mmol/L (ref 98–111)
Creatinine, Ser: 4.08 mg/dL — ABNORMAL HIGH (ref 0.61–1.24)
GFR, Estimated: 15 mL/min — ABNORMAL LOW (ref >60)
Glucose, Bld: 97 mg/dL (ref 70–99)
Potassium: 3.1 mmol/L — ABNORMAL LOW (ref 3.5–5.1)
Sodium: 136 mmol/L (ref 135–145)

## 2023-07-16 LAB — ANCA PROFILE
Anti-MPO Antibodies: <0.2 U (ref 0.0–0.9)
Anti-PR3 Antibodies: <0.2 U (ref 0.0–0.9)
Atypical P-ANCA titer: <1:20 {titer}
C-ANCA: <1:20 {titer}
P-ANCA: <1:20 {titer}

## 2023-07-16 LAB — HCV RNA QUANT: HCV Quantitative: NOT DETECTED [IU]/mL (ref >50)

## 2023-07-16 LAB — RESP PANEL BY RT-PCR (RSV, FLU A&B, COVID)  RVPGX2
Influenza A by PCR: NEGATIVE
Influenza B by PCR: NEGATIVE
Resp Syncytial Virus by PCR: NEGATIVE
SARS Coronavirus 2 by RT PCR: NEGATIVE

## 2023-07-16 MED ORDER — VITAMIN B-12 1000 MCG PO TABS
1000.0000 ug | ORAL_TABLET | Freq: Every day | ORAL | Status: DC
  Administered 2023-07-16 – 2023-07-22 (×6): 1000 ug via ORAL
  Filled 2023-07-16 (×7): qty 1

## 2023-07-16 MED ORDER — POTASSIUM CHLORIDE CRYS ER 20 MEQ PO TBCR
20.0000 meq | EXTENDED_RELEASE_TABLET | Freq: Once | ORAL | Status: AC
  Administered 2023-07-16: 20 meq via ORAL
  Filled 2023-07-16: qty 1

## 2023-07-16 NOTE — Progress Notes (Signed)
 Progress Note   Patient: [name] ZOX:[id number] DOB: 1951/01/10 DOA: 07/09/2023     7 DOS: the patient was seen and examined on 07/16/2023   Brief hospital course: [name] is a 73 y.o. 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  Patient was found to have oliguric acute renal failure, renal function not improving after West fluids, dialysis was started on 3/1.   Principal Problem:   AKI (acute kidney injury) (HCC) Active Problems:   High anion gap metabolic acidosis   Thrombocytopenia (HCC)   Anemia   Frequent falls   Complex regional pain syndrome of both lower extremities   Vitamin B12 deficiency   COPD (chronic obstructive pulmonary disease) (HCC)   Chronic respiratory failure with hypoxia (HCC)   Seizure disorder (HCC)   Essential hypertension   Tobacco use disorder   History of hepatitis C   Nasopharyngeal carcinoma (HCC)   Type 2 diabetes mellitus without complication, without long-term current use of insulin (HCC)   Dementia without behavioral disturbance (HCC)   Alcohol use disorder   Class 1 obesity   Assessment and Plan:  * AKI (acute kidney injury) (HCC) secondary to ATN, now needing dialysis High anion gap metabolic acidosis Hypokalemia. --unclear etiology.  Pos ibuprofen use, recent lisinopril.  Oliguric.   --nephrology consulted, started on dialysis on 3/1 Renal function stabilizing/improving. Discussed with nephrology for outpatient dialysis plan and permacath. Potassium 3.1, given 20 mEq of oral potassium.   Vitamin B12 anemia Thrombocytopenia Condition is still stable, start oral B12.   Frequent falls Generalized weakness Nursing home placement.    diabetic polyneuropathy Complex regional pain syndrome of both lower extremities --taper down home gabapentin due to kidney fxn decline   COPD (chronic obstructive pulmonary disease) (HCC) Chronic respiratory failure with hypoxia Not acutely exacerbated   Alcohol use disorder Thiamine folate and multivitamin, no alcohol withdrawal while in the hospital.   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  Followed by oncology and ENT   History of hepatitis C --hep C RNA pending, follow-up with infect disease after discharge.   Tobacco use disorder Nicotine patch   Essential hypertension Blood pressure varied --hold BP meds   Seizure disorder (HCC) Not on specific AEDs   Fever --100.8 early morning of 3/4 Respiratory panel was negative, no fever over the last 24 hours. Recent UA negative.  I obtained a chest x-ray, no evidence of pneumonia. Blood culture obtained today has no growth so far. No need for antibiotics.        Subjective:  Patient doing well today, has some confusion without agitation.  Denies any short of breath or cough, no dysuria or hematuria.  No additional fever.  Physical Exam: Vitals:   07/15/23 1640 07/15/23 1949 07/16/23 0624 07/16/23 0700  BP: (!) 144/75 136/77 124/73 129/66  Pulse: 82 73 71 70  Resp: 16 16 16 18   Temp:  98.1 F (36.7 C) 98.3 F (36.8 C) 98.1 F (36.7 C) 98.3 F (36.8 C)  TempSrc:    Oral  SpO2: 97% 94% 95% 93%  Weight:      Height:       General exam: Appears calm and comfortable  Respiratory system: Clear to  auscultation. Respiratory effort normal. Cardiovascular system: S1 & S2 heard, RRR. No JVD, murmurs, rubs, gallops or clicks. No pedal edema. Gastrointestinal system: Abdomen is nondistended, soft and nontender. No organomegaly or masses felt. Normal bowel sounds heard. Central nervous system: Alert and oriented x2. No focal neurological deficits. Extremities: Symmetric 5 x 5 power. Skin: No rashes, lesions or ulcers Psychiatry: Judgement and insight appear normal. Mood & affect appropriate.    Data Reviewed:  Chest x-ray and lab results reviewed.  Family Communication: Sister updated over the phone.  Disposition: Status is: Inpatient Remains inpatient appropriate because: Severity of disease, unsafe discharge.     Time spent: 35 minutes  Author: Marrion Coy, MD 07/16/2023 11:58 AM  For on call review [email or web address].

## 2023-07-16 NOTE — Plan of Care (Signed)

## 2023-07-16 NOTE — TOC Progression Note (Signed)
 Transition of Care [facility]) - Progression Note    Patient Details  Name: JHOVANI GRISWOLD MRN: [id number] Date of Birth: [birth year]  Transition of Care [facility]) CM/SW Contact  Chapman Fitch, RN Phone Number: 07/16/2023, 11:01 AM  Clinical Narrative:    PASRR pending Outpatient HD pending Patient will require auth with medically appropriate for [facility].  MD updated        Expected Discharge Plan and Services                                               Social Determinants of Health (SDOH) Interventions SDOH Screenings   Food Insecurity: No Food Insecurity (07/10/2023)  Housing: Low Risk  (07/10/2023)  Transportation Needs: No Transportation Needs (07/10/2023)  Utilities: Not At Risk (07/10/2023)  Alcohol Screen: Low Risk  (10/28/2022)  Depression (PHQ2-9): Low Risk  (06/25/2023)  Financial Resource Strain: Low Risk  (10/22/2022)  Physical Activity: Inactive (10/22/2022)  Social Connections: Socially Isolated (07/10/2023)  Stress: Stress Concern Present (01/29/2023)  Tobacco Use: Medium Risk (07/09/2023)    Readmission Risk Interventions     No data to display

## 2023-07-16 NOTE — Hospital Course (Addendum)
 [name] is a 73 y.o. 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  Patient was found to have oliguric acute renal failure, renal function not improving after West fluids, dialysis was started on 3/1. Initially required nursing placement, but condition has improved.  No longer need SNF. Patient renal function also improving, discussed with nephrology, they will keep with dialysis chair, but will follow-up in the office to see if he still needs it.  Currently he is medically stable for discharge.

## 2023-07-16 NOTE — TOC Progression Note (Signed)
 Transition of Care [facility]) - Progression Note    Patient Details  Name: [name] MRN: [id number] Date of Birth: [birth year]  Transition [facility] Care [facility]) CM/SW Contact  Chapman Fitch, RN Phone Number: 07/16/2023, 3:18 PM  Clinical Narrative:      [phone] B. No expiration date.       Expected Discharge Plan and Services                                               Social Determinants of Health (SDOH) Interventions SDOH Screenings   Food Insecurity: No Food Insecurity (07/10/2023)  Housing: Low Risk  (07/10/2023)  Transportation Needs: No Transportation Needs (07/10/2023)  Utilities: Not At Risk (07/10/2023)  Alcohol Screen: Low Risk  (10/28/2022)  Depression (PHQ2-9): Low Risk  (06/25/2023)  Financial Resource Strain: Low Risk  (10/22/2022)  Physical Activity: Inactive (10/22/2022)  Social Connections: Socially Isolated (07/10/2023)  Stress: Stress Concern Present (01/29/2023)  Tobacco Use: Medium Risk (07/09/2023)    Readmission Risk Interventions     No data to display

## 2023-07-16 NOTE — Progress Notes (Signed)
 [facility]  ROUNDING NOTE   Subjective:   Patient seen resting in bed More alert and oriented x 2-3 Safety sitter remains at bedside Patient voices understanding of lack of appropriate renal function at this time and need to continue dialysis.   Objective:  Vital signs in last 24 hours:  Temp:  [97.3 F (36.3 C)-98.3 F (36.8 C)] 98.3 F (36.8 C) (03/05 0700) Pulse Rate:  [70-96] 70 (03/05 0700) Resp:  [13-21] 18 (03/05 0700) BP: (123-150)/(65-92) 129/66 (03/05 0700) SpO2:  [93 %-100 %] 93 % (03/05 0700) Weight:  [93.2 kg] 93.2 kg (03/04 1159)  Weight change: -1 kg Filed Weights   07/14/23 1214 07/15/23 0836 07/15/23 1159  Weight: 94 kg 93.2 kg 93.2 kg    Intake/Output: I/O last 3 completed shifts: In: -  Out: 1800 [Urine:1800]   Intake/Output this shift:  No intake/output data recorded.  Physical Exam: General: NAD  Head: Normocephalic, atraumatic. Moist oral mucosal membranes  Eyes: Anicteric  Lungs:  Clear to auscultation, normal effort  Heart: Regular rate and rhythm  Abdomen:  Soft, nontender, distention  Extremities: No peripheral edema.  Neurologic: Alert and awake, moving all four extremities  Skin: No lesions  Access: None    Basic Metabolic Panel: Recent Labs  Lab 07/11/23 0438 07/12/23 0557 07/13/23 0722 07/14/23 0428 07/15/23 0411 07/16/23 0427  NA 130* 133* 134* 136 136 136  K 3.4* 4.1 4.0 4.0 3.3* 3.1*  CL 96* 101 103 102 99 101  CO2 19* 19* 19* 23 23 25   GLUCOSE 91 96 86 93 92 97  BUN 48* 51* 40* 46* 33* 24*  CREATININE 9.76* 10.00* 8.04* 7.93* 6.15* 4.08*  CALCIUM 7.6* 7.9* 8.2* 8.6* 8.6* 8.9  MG 2.0 2.0 1.9 2.1 1.9  --   PHOS 5.1* 5.7*  --   --   --   --     Liver Function Tests: Recent Labs  Lab 07/09/23 1916  AST 48*  ALT 20  ALKPHOS 64  BILITOT 1.1  PROT 7.2  ALBUMIN 3.8   No results for input(s): "LIPASE", "AMYLASE" in the last 168 hours. No results for input(s): "AMMONIA" in the last 168  hours.  CBC: Recent Labs  Lab 07/09/23 1916 07/10/23 0155 07/12/23 0852 07/14/23 0906 07/15/23 0853  WBC 6.9  6.8 6.0 5.0 3.1* 6.3  NEUTROABS 5.5  --   --   --   --   HGB 12.4*  12.5* 12.0* 11.3* 10.9* 10.8*  HCT 36.7*  36.9* 35.1* 33.8* 32.2* 31.3*  MCV 94.6  96.3 95.4 94.4 92.8 94.6  PLT 79*  76* 68* 71* 88* 96*    Cardiac Enzymes: No results for input(s): "CKTOTAL", "CKMB", "CKMBINDEX", "TROPONINI" in the last 168 hours.  BNP: Invalid input(s): "POCBNP"  CBG: Recent Labs  Lab 07/11/23 2035 07/12/23 1118 07/13/23 0807 07/13/23 1126 07/15/23 1447  GLUCAP 99 94 77 165* 94    Microbiology: Results for orders placed or performed during the hospital encounter of 07/09/23  Culture, blood (Routine X 2) w Reflex to ID Panel     Status: None (Preliminary result)   Collection Time: 07/15/23 10:02 PM   Specimen: BLOOD  Result Value Ref Range Status   Specimen Description BLOOD LEFT ARM  Final   Special Requests   Final    BOTTLES DRAWN AEROBIC AND ANAEROBIC Blood Culture adequate volume   Culture   Final    NO GROWTH < 12 HOURS Performed at [facility], [address] [place], [place] [address]    Report Status PENDING  Incomplete  Culture, blood (Routine X 2) w Reflex to ID Panel     Status: None (Preliminary result)   Collection Time: 07/15/23 10:02 PM   Specimen: BLOOD  Result Value Ref Range Status   Specimen Description BLOOD RIGHT ARM  Final   Special Requests   Final    BOTTLES DRAWN AEROBIC AND ANAEROBIC Blood Culture adequate volume   Culture   Final    NO GROWTH < 12 HOURS Performed at [facility], [address], [place], [place] [address]    Report Status PENDING  Incomplete  Resp panel by RT-PCR (RSV, Flu A&B, Covid) Anterior Nasal Swab     Status: None   Collection Time: 07/15/23 10:24 PM   Specimen: Anterior Nasal Swab  Result Value Ref Range Status   SARS Coronavirus 2 by RT PCR NEGATIVE NEGATIVE Final    Comment:  (NOTE) SARS-CoV-2 target nucleic acids are NOT DETECTED.  The SARS-CoV-2 RNA is generally detectable in upper respiratory specimens during the acute phase of infection. The lowest concentration of SARS-CoV-2 viral copies this assay can detect is 138 copies/mL. A negative result does not preclude SARS-Cov-2 infection and should not be used as the sole basis for treatment or other patient management decisions. A negative result may occur with  improper specimen collection/handling, submission of specimen other than nasopharyngeal swab, presence of viral mutation(s) within the areas targeted by this assay, and inadequate number of viral copies(<138 copies/mL). A negative result must be combined with clinical observations, patient history, and epidemiological information. The expected result is Negative.  Fact Sheet for Patients:  BloggerCourse.com  Fact Sheet for Healthcare Providers:  SeriousBroker.it  This test is no t yet approved or cleared by the [place] FDA and  has been authorized for detection and/or diagnosis of SARS-CoV-2 by FDA under an Emergency Use Authorization (EUA). This EUA will remain  in effect (meaning this test can be used) for the duration of the COVID-19 declaration under Section 564(b)(1) of the Act, 21 U.S.C.section 360bbb-3(b)(1), unless the authorization is terminated  or revoked sooner.       Influenza A by PCR NEGATIVE NEGATIVE Final   Influenza B by PCR NEGATIVE NEGATIVE Final    Comment: (NOTE) The Xpert Xpress SARS-CoV-2/FLU/RSV plus assay is intended as an aid in the diagnosis of influenza from Nasopharyngeal swab specimens and should not be used as a sole basis for treatment. Nasal washings and aspirates are unacceptable for Xpert Xpress SARS-CoV-2/FLU/RSV testing.  Fact Sheet for Patients: BloggerCourse.com  Fact Sheet for Healthcare  Providers: SeriousBroker.it  This test is not yet approved or cleared by the [place] FDA and has been authorized for detection and/or diagnosis of SARS-CoV-2 by FDA under an Emergency Use Authorization (EUA). This EUA will remain in effect (meaning this test can be used) for the duration of the COVID-19 declaration under Section 564(b)(1) of the Act, 21 U.S.C. section 360bbb-3(b)(1), unless the authorization is terminated or revoked.     Resp Syncytial Virus by PCR NEGATIVE NEGATIVE Final    Comment: (NOTE) Fact Sheet for Patients: BloggerCourse.com  Fact Sheet for Healthcare Providers: SeriousBroker.it  This test is not yet approved or cleared by the [place] FDA and has been authorized for detection and/or diagnosis of SARS-CoV-2 by FDA under an Emergency Use Authorization (EUA). This EUA will remain in effect (meaning this test can be used) for the duration of the COVID-19 declaration under Section  564(b)(1) of the Act, 21 U.S.C. section 360bbb-3(b)(1), unless the authorization is terminated or revoked.  Performed at [facility], [address] [place], [place] [address]     Coagulation Studies: No results for input(s): "LABPROT", "INR" in the last 72 hours.   Urinalysis: No results for input(s): "COLORURINE", "LABSPEC", "PHURINE", "GLUCOSEU", "HGBUR", "BILIRUBINUR", "KETONESUR", "PROTEINUR", "UROBILINOGEN", "NITRITE", "LEUKOCYTESUR" in the last 72 hours.  Invalid input(s): "APPERANCEUR"     Imaging: No results found.    Medications:      amitriptyline  75 mg Oral QHS   aspirin EC  81 mg Oral Daily   Chlorhexidine Gluconate Cloth  6 each Topical Q0600   Chlorhexidine Gluconate Cloth  6 each Topical Q0600   folic acid  1 mg Oral Daily   heparin injection (subcutaneous)  5,000 Units Subcutaneous Q8H   multivitamin with minerals  1 tablet Oral Daily   nicotine   14 mg Transdermal Daily   polyethylene glycol  34 g Oral BID   potassium chloride  20 mEq Oral Once   QUEtiapine  50 mg Oral QHS   rosuvastatin  40 mg Oral Daily   thiamine  100 mg Oral Daily   acetaminophen **OR** acetaminophen, albuterol, haloperidol lactate, HYDROcodone-acetaminophen, ondansetron **OR** ondansetron (ZOFRAN) IV, mouth rinse  Assessment/ Plan:  [name] IV is a 73 y.o.  male with past medical conditions including alcohol use disorder, COPD with 2 L nasal cannula, diet-controlled diabetes, hepatitis C, seizure disorder, hypertension, and vitamin B12 deficiency, who was admitted to [facility] on 07/09/2023 for Thrombocytopenia (HCC) [D69.6] AKI (acute kidney injury) (HCC) [N17.9] Frequent falls [R29.6] Increased anion gap metabolic acidosis [E87.29] Injury of head, initial encounter [S09.90XA]   Acute kidney injury likely secondary to dehydration with NSAID use. Normal renal function noted 2 weeks prior. Renal ultrasound negative for hydronephrosis. No recent contrast exposure. Continue holding ibuprofen and lisinopril.  Autoimmune and complements within desired range. Kappa lambda ratio elevated, not uncommon with renal failure. Patient received third dialysis treatment yesterday, tolerated well.  Next treatment scheduled for Thursday  Patient will likely need to continue dialysis for short period.  Will consult vascular for PermCath placement later this week  Renal navigator aware of patient, outpatient clinic search underway.    Lab Results  Component Value Date   CREATININE 4.08 (H) 07/16/2023   CREATININE 6.15 (H) 07/15/2023   CREATININE 7.93 (H) 07/14/2023    Intake/Output Summary (Last 24 hours) at 07/16/2023 1016 Last data filed at 07/16/2023 0033 Gross per 24 hour  Intake --  Output 450 ml  Net -450 ml   2.  Hypertension, essential.  Home regimen includes amlodipine and lisinopril.  Both currently held.   Blood pressure stable, 129/66   3.  Diabetes mellitus type II, renal manifestations: noninsulin dependent. Most recent hemoglobin A1c is 6.1 on 06/25/23.    4. Anemia with thrombocytopenia Lab Results  Component Value Date   HGB 10.8 (L) 07/15/2023    Hemoglobin acceptable at 10.8.  No need for ESA's at this time   LOS: 7 Adia Crammer 3/5/202510:16 AM

## 2023-07-16 NOTE — Plan of Care (Signed)
  Problem: Education: Goal: Ability to describe self-care measures that may prevent or decrease complications (Diabetes Survival Skills Education) will improve Outcome: Progressing Goal: Individualized Educational Video(s) Outcome: Progressing   Problem: Coping: Goal: Ability to adjust to condition or change in health will improve Outcome: Progressing   Problem: Fluid Volume: Goal: Ability to maintain a balanced intake and output will improve Outcome: Progressing   Problem: Health Behavior/Discharge Planning: Goal: Ability to identify and utilize available resources and services will improve Outcome: Progressing Goal: Ability to manage health-related needs will improve Outcome: Progressing   Problem: Nutritional: Goal: Maintenance of adequate nutrition will improve Outcome: Progressing Goal: Progress toward achieving an optimal weight will improve Outcome: Progressing   Problem: Skin Integrity: Goal: Risk for impaired skin integrity will decrease Outcome: Progressing   Problem: Health Behavior/Discharge Planning: Goal: Ability to manage health-related needs will improve Outcome: Progressing   Problem: Clinical Measurements: Goal: Ability to maintain clinical measurements within normal limits will improve Outcome: Progressing Goal: Will remain free from infection Outcome: Progressing Goal: Diagnostic test results will improve Outcome: Progressing Goal: Respiratory complications will improve Outcome: Progressing Goal: Cardiovascular complication will be avoided Outcome: Progressing   Problem: Activity: Goal: Risk for activity intolerance will decrease Outcome: Progressing   Problem: Nutrition: Goal: Adequate nutrition will be maintained Outcome: Progressing   Problem: Pain Managment: Goal: General experience of comfort will improve and/or be controlled Outcome: Progressing

## 2023-07-16 NOTE — Plan of Care (Signed)
 Problem: Education: Goal: Ability to describe self-care measures that may prevent or decrease complications (Diabetes Survival Skills Education) will improve 07/16/2023 0330 by Claire Shown, RN Outcome: Progressing 07/16/2023 0330 by Claire Shown, RN Outcome: Progressing Goal: Individualized Educational Video(s) 07/16/2023 0330 by Claire Shown, RN Outcome: Progressing 07/16/2023 0330 by Claire Shown, RN Outcome: Progressing   Problem: Coping: Goal: Ability to adjust to condition or change in health will improve 07/16/2023 0330 by Claire Shown, RN Outcome: Progressing 07/16/2023 0330 by Claire Shown, RN Outcome: Progressing   Problem: Fluid Volume: Goal: Ability to maintain a balanced intake and output will improve 07/16/2023 0330 by Claire Shown, RN Outcome: Progressing 07/16/2023 0330 by Claire Shown, RN Outcome: Progressing   Problem: Health Behavior/Discharge Planning: Goal: Ability to identify and utilize available resources and services will improve 07/16/2023 0330 by Claire Shown, RN Outcome: Progressing 07/16/2023 0330 by Claire Shown, RN Outcome: Progressing Goal: Ability to manage health-related needs will improve 07/16/2023 0330 by Claire Shown, RN Outcome: Progressing 07/16/2023 0330 by Claire Shown, RN Outcome: Progressing   Problem: Metabolic: Goal: Ability to maintain appropriate glucose levels will improve 07/16/2023 0330 by Claire Shown, RN Outcome: Progressing 07/16/2023 0330 by Claire Shown, RN Outcome: Progressing   Problem: Nutritional: Goal: Maintenance of adequate nutrition will improve 07/16/2023 0330 by Claire Shown, RN Outcome: Progressing 07/16/2023 0330 by Claire Shown, RN Outcome: Progressing Goal: Progress toward achieving an optimal weight will improve 07/16/2023 0330 by Claire Shown, RN Outcome: Progressing 07/16/2023 0330 by Claire Shown, RN Outcome: Progressing   Problem: Skin Integrity: Goal: Risk for impaired skin integrity will decrease 07/16/2023 0330 by Claire Shown,  RN Outcome: Progressing 07/16/2023 0330 by Claire Shown, RN Outcome: Progressing   Problem: Tissue Perfusion: Goal: Adequacy of tissue perfusion will improve 07/16/2023 0330 by Claire Shown, RN Outcome: Progressing 07/16/2023 0330 by Claire Shown, RN Outcome: Progressing   Problem: Education: Goal: Knowledge of General Education information will improve Description: Including pain rating scale, medication(s)/side effects and non-pharmacologic comfort measures 07/16/2023 0330 by Claire Shown, RN Outcome: Progressing 07/16/2023 0330 by Claire Shown, RN Outcome: Progressing   Problem: Health Behavior/Discharge Planning: Goal: Ability to manage health-related needs will improve 07/16/2023 0330 by Claire Shown, RN Outcome: Progressing 07/16/2023 0330 by Claire Shown, RN Outcome: Progressing   Problem: Clinical Measurements: Goal: Ability to maintain clinical measurements within normal limits will improve 07/16/2023 0330 by Claire Shown, RN Outcome: Progressing 07/16/2023 0330 by Claire Shown, RN Outcome: Progressing Goal: Will remain free from infection 07/16/2023 0330 by Claire Shown, RN Outcome: Progressing 07/16/2023 0330 by Claire Shown, RN Outcome: Progressing Goal: Diagnostic test results will improve 07/16/2023 0330 by Claire Shown, RN Outcome: Progressing 07/16/2023 0330 by Claire Shown, RN Outcome: Progressing Goal: Respiratory complications will improve 07/16/2023 0330 by Claire Shown, RN Outcome: Progressing 07/16/2023 0330 by Claire Shown, RN Outcome: Progressing Goal: Cardiovascular complication will be avoided 07/16/2023 0330 by Claire Shown, RN Outcome: Progressing 07/16/2023 0330 by Claire Shown, RN Outcome: Progressing   Problem: Activity: Goal: Risk for activity intolerance will decrease 07/16/2023 0330 by Claire Shown, RN Outcome: Progressing 07/16/2023 0330 by Claire Shown, RN Outcome: Progressing   Problem: Nutrition: Goal: Adequate nutrition will be maintained 07/16/2023 0330 by Claire Shown,  RN Outcome: Progressing 07/16/2023 0330 by Claire Shown, RN Outcome: Progressing   Problem: Coping: Goal: Level of anxiety will decrease 07/16/2023 0330 by Claire Shown, RN Outcome: Progressing 07/16/2023 0330 by Claire Shown, RN Outcome: Progressing   Problem: Elimination: Goal: Will not experience complications related to bowel motility 07/16/2023 0330  by Claire Shown, RN Outcome: Progressing 07/16/2023 0330 by Claire Shown, RN Outcome: Progressing Goal: Will not experience complications related to urinary retention 07/16/2023 0330 by Claire Shown, RN Outcome: Progressing 07/16/2023 0330 by Claire Shown, RN Outcome: Progressing   Problem: Pain Managment: Goal: General experience of comfort will improve and/or be controlled 07/16/2023 0330 by Claire Shown, RN Outcome: Progressing 07/16/2023 0330 by Claire Shown, RN Outcome: Progressing   Problem: Safety: Goal: Ability to remain free from injury will improve 07/16/2023 0330 by Claire Shown, RN Outcome: Progressing 07/16/2023 0330 by Claire Shown, RN Outcome: Progressing   Problem: Skin Integrity: Goal: Risk for impaired skin integrity will decrease 07/16/2023 0330 by Claire Shown, RN Outcome: Progressing 07/16/2023 0330 by Claire Shown, RN Outcome: Progressing

## 2023-07-16 NOTE — Progress Notes (Signed)
 PT Cancellation Note  Patient Details Name: [name] MRN: [id number] DOB: December 19, 1950   Cancelled Treatment:     PT attempt. Pt sleeping soundly upon arrival. He does awake but was unwilling to participate. " I'm just tired today. Can you come back tomorrow?"     Rushie Chestnut 07/16/2023, 2:20 PM

## 2023-07-17 DIAGNOSIS — E538 Deficiency of other specified B group vitamins: Secondary | ICD-10-CM

## 2023-07-17 DIAGNOSIS — E8729 Other acidosis: Secondary | ICD-10-CM | POA: Diagnosis not present

## 2023-07-17 DIAGNOSIS — N179 Acute kidney failure, unspecified: Secondary | ICD-10-CM | POA: Diagnosis not present

## 2023-07-17 LAB — BASIC METABOLIC PANEL
Anion gap: 12 (ref 5–15)
BUN: 32 mg/dL — ABNORMAL HIGH (ref 8–23)
CO2: 27 mmol/L (ref 22–32)
Calcium: 9.2 mg/dL (ref 8.9–10.3)
Chloride: 99 mmol/L (ref 98–111)
Creatinine, Ser: 4.4 mg/dL — ABNORMAL HIGH (ref 0.61–1.24)
GFR, Estimated: 14 mL/min — ABNORMAL LOW (ref >60)
Glucose, Bld: 100 mg/dL — ABNORMAL HIGH (ref 70–99)
Potassium: 3.3 mmol/L — ABNORMAL LOW (ref 3.5–5.1)
Sodium: 138 mmol/L (ref 135–145)

## 2023-07-17 LAB — CBC
HCT: 32.3 % — ABNORMAL LOW (ref 39.0–52.0)
Hemoglobin: 11.1 g/dL — ABNORMAL LOW (ref 13.0–17.0)
MCH: 32.1 pg (ref 26.0–34.0)
MCHC: 34.4 g/dL (ref 30.0–36.0)
MCV: 93.4 fL (ref 80.0–100.0)
Platelets: 113 10*3/uL — ABNORMAL LOW (ref 150–400)
RBC: 3.46 MIL/uL — ABNORMAL LOW (ref 4.22–5.81)
RDW: 15.8 % — ABNORMAL HIGH (ref 11.5–15.5)
WBC: 5.7 10*3/uL (ref 4.0–10.5)
nRBC: 0 % (ref 0.0–0.2)

## 2023-07-17 MED ORDER — HEPARIN SODIUM (PORCINE) 1000 UNIT/ML DIALYSIS: 1000.0000 [IU] | INTRAMUSCULAR | Status: DC | PRN

## 2023-07-17 MED ORDER — ENSURE ENLIVE PO LIQD
237.0000 mL | Freq: Two times a day (BID) | ORAL | Status: DC
  Administered 2023-07-17 – 2023-07-22 (×8): 237 mL via ORAL

## 2023-07-17 MED ORDER — POTASSIUM CHLORIDE CRYS ER 20 MEQ PO TBCR
20.0000 meq | EXTENDED_RELEASE_TABLET | Freq: Once | ORAL | Status: DC
  Filled 2023-07-17: qty 1

## 2023-07-17 MED ORDER — ALTEPLASE 2 MG IJ SOLR: 2.0000 mg | Freq: Once | INTRAMUSCULAR | Status: DC | PRN

## 2023-07-17 MED ORDER — POTASSIUM CHLORIDE CRYS ER 20 MEQ PO TBCR
20.0000 meq | EXTENDED_RELEASE_TABLET | Freq: Once | ORAL | Status: AC
  Administered 2023-07-17: 20 meq via ORAL

## 2023-07-17 MED ORDER — HEPARIN SODIUM (PORCINE) 1000 UNIT/ML IJ SOLN
1000.0000 [IU] | INTRAMUSCULAR | Status: DC | PRN
  Administered 2023-07-17 (×2): 1000 [IU] via INTRAVENOUS

## 2023-07-17 NOTE — H&P (View-Only) (Signed)
 Progress Note    07/17/2023 7:54 AM 6 Days Post-Op  Subjective:  This is a 73 y.o. 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  He feels like his knees just gives out and he falls.  He hit his head on 1 occasion but did not lose consciousness.    Upon work up for generalized weakness the patient was noted to have elevated of 8029 up from 1.2 and elevated BUN up to 38. Today's Bun + 48 and Creatinine up to 9.76. Nephrology recommends starting hemodialysis. Vascular surgery now requested to place dialysis Perma catheter.    Vitals:   07/17/23 0327 07/17/23 0720  BP: 122/66 129/83  Pulse: 67 88  Resp: 20 18  Temp: 98.6 F (37 C) 98.5 F (36.9 C)  SpO2: 94% 97%   Physical Exam: Cardiac:  RRR, Normal S!, S2. No murmurs Lungs:  Clear on auscultation. Non labored breathing.  Incisions:  None Extremities:  Right femoral groin temp cath in place. Bilateral lower extremity pulses palpable. Warm to touch.  Abdomen:  Positive bowel sounds throughout, soft, non tender and non distended.  Neurologic: AAOX3 Answers all questions and follows commands   CBC    Component Value Date/Time   WBC 6.3 07/15/2023 0853   RBC 3.31 (L) 07/15/2023 0853   HGB 10.8 (L) 07/15/2023 0853   HGB 14.3 06/25/2023 1136   HCT 31.3 (L) 07/15/2023 0853   HCT 42.8 06/25/2023 1136   PLT 96 (L) 07/15/2023 0853   PLT 155 06/25/2023 1136   MCV 94.6 07/15/2023 0853   MCV 97 06/25/2023 1136   MCV 94 12/31/2013 1207   MCH 32.6 07/15/2023 0853   MCHC 34.5 07/15/2023 0853   RDW 15.9 (H) 07/15/2023 0853   RDW 15.8 (H) 06/25/2023 1136   RDW 14.3 12/31/2013 1207   LYMPHSABS 0.7 07/09/2023 1916   LYMPHSABS 1.3 01/24/2023 1435   LYMPHSABS 2.8  06/08/2013 0423   MONOABS 0.4 07/09/2023 1916   MONOABS 0.9 06/08/2013 0423   EOSABS 0.3 07/09/2023 1916   EOSABS 0.2 01/24/2023 1435   EOSABS 0.1 06/08/2013 0423   BASOSABS 0.0 07/09/2023 1916   BASOSABS 0.0 01/24/2023 1435   BASOSABS 0.0 06/08/2013 0423    BMET    Component Value Date/Time   NA 138 07/17/2023 0419   NA 140 06/25/2023 1136   NA 139 12/31/2013 1207   K 3.3 (L) 07/17/2023 0419   K 3.8 12/31/2013 1207   CL 99 07/17/2023 0419   CL 101 12/31/2013 1207   CO2 27 07/17/2023 0419   CO2 28 12/31/2013 1207   GLUCOSE 100 (H) 07/17/2023 0419   GLUCOSE 134 (H) 12/31/2013 1207   BUN 32 (H) 07/17/2023 0419   BUN 16 06/25/2023 1136   BUN 8 12/31/2013 1207   CREATININE 4.40 (H) 07/17/2023 0419   CREATININE 1.03 12/31/2013 1207   CALCIUM 9.2 07/17/2023 0419   CALCIUM 8.8 12/31/2013 1207   GFRNONAA 14 (L) 07/17/2023 0419   GFRNONAA >60 12/31/2013 1207   GFRAA >60 12/11/2019 0531   GFRAA >60 12/31/2013 1207    INR    Component Value Date/Time   INR 1.1 07/09/2023 2340    No intake or output data in the 24 hours ending 07/17/23 0754   Assessment/Plan:  73 y.o. male is s/p temporary dialysis catheter.  6 Days Post-Op   Plan Vascular Surgery plans on taking the patient to the vascular lab for placement of dialysis perma catheter for long term hemodialysis. I discussed in detail at the bedside this morning with the patient the procedure, benefits, risks and complications. Patient verbalizes his understanding and wishes to proceed. I answered all the patients questions this morning. Patient endorses he has been NPO since midnight.    DVT prophylaxis:  Heparin with Dialysis   Marcie Bal [facility] 07/17/2023 7:54 AM

## 2023-07-17 NOTE — Plan of Care (Signed)
  Problem: Fluid Volume: Goal: Ability to maintain a balanced intake and output will improve Outcome: Progressing   Problem: Health Behavior/Discharge Planning: Goal: Ability to manage health-related needs will improve Outcome: Progressing   Problem: Nutritional: Goal: Maintenance of adequate nutrition will improve Outcome: Progressing Goal: Progress toward achieving an optimal weight will improve Outcome: Progressing   Problem: Skin Integrity: Goal: Risk for impaired skin integrity will decrease Outcome: Progressing   Problem: Tissue Perfusion: Goal: Adequacy of tissue perfusion will improve Outcome: Progressing   Problem: Education: Goal: Knowledge of General Education information will improve Description: Including pain rating scale, medication(s)/side effects and non-pharmacologic comfort measures Outcome: Progressing   Problem: Clinical Measurements: Goal: Will remain free from infection Outcome: Progressing

## 2023-07-17 NOTE — Progress Notes (Signed)
 Progress Note   Patient: [name] DGU:[id number] DOB: 07-28-1950 DOA: 07/09/2023     8 DOS: the patient was seen and examined on 07/17/2023   Brief hospital course: SENICA CRALL West is a 73 y.o. 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  Patient was found to have oliguric acute renal failure, renal function not improving after West fluids, dialysis was started on 3/1.   Principal Problem:   AKI (acute kidney injury) (HCC) Active Problems:   High anion gap metabolic acidosis   Thrombocytopenia (HCC)   Anemia   Frequent falls   Complex regional pain syndrome of both lower extremities   Vitamin B12 deficiency   COPD (chronic obstructive pulmonary disease) (HCC)   Chronic respiratory failure with hypoxia (HCC)   Seizure disorder (HCC)   Essential hypertension   Tobacco use disorder   History of hepatitis C   Nasopharyngeal carcinoma (HCC)   Type 2 diabetes mellitus without complication, without long-term current use of insulin (HCC)   Dementia without behavioral disturbance (HCC)   Alcohol use disorder   Class 1 obesity   Assessment and Plan: * AKI (acute kidney injury) (HCC) secondary to ATN, now needing dialysis High anion gap metabolic acidosis Hypokalemia. --unclear etiology.  Pos ibuprofen use, recent lisinopril.  Oliguric.   --nephrology consulted, started on dialysis on 3/1 Renal function stabilizing/improving. Discussed with nephrology for outpatient dialysis plan and permacath. Patient will have a permacath placed today.   Vitamin B12 anemia Thrombocytopenia Condition is still stable, started oral B12.   Frequent falls Generalized weakness Nursing home placement.    diabetic polyneuropathy Complex regional pain syndrome of both lower extremities --taper down home gabapentin due to kidney fxn decline   COPD (chronic obstructive pulmonary disease) (HCC) Chronic respiratory failure with hypoxia Not acutely exacerbated   Alcohol use disorder Thiamine folate and multivitamin, no alcohol withdrawal while in the hospital.   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  Followed by oncology and ENT   History of hepatitis C --hep C RNA pending, follow-up with infect disease after discharge.   Tobacco use disorder Nicotine patch   Essential hypertension Blood pressure varied --hold BP meds   Seizure disorder (HCC) Not on specific AEDs   Fever --100.8 early morning of 3/4 Respiratory panel was negative, no fever over the last 24 hours. Recent UA negative.  I obtained a chest x-ray, no evidence of pneumonia. Repeat blood culture negative for 2 days. No additional fever, antibiotics not needed.        Subjective:  Feels well today, no confusion.  No short of breath.  Physical Exam: Vitals:   07/17/23 1145 07/17/23 1216 07/17/23 1223 07/17/23 1229  BP: (!) 143/88 (!) 142/73 (!) 145/73   Pulse: 75 68 76   Resp: 15 19 (!) 21   Temp:   97.8 F (36.6 C)   TempSrc:   Oral  SpO2: 100%  96%   Weight:    90.6 kg  Height:       General exam: Appears calm and comfortable  Respiratory system: Clear to auscultation. Respiratory effort normal. Cardiovascular system: S1 & S2 heard, RRR. No JVD, murmurs, rubs, gallops or clicks. No pedal  edema. Gastrointestinal system: Abdomen is nondistended, soft and nontender. No organomegaly or masses felt. Normal bowel sounds heard. Central nervous system: Alert and oriented. No focal neurological deficits. Extremities: Symmetric 5 x 5 power. Skin: No rashes, lesions or ulcers Psychiatry: Judgement and insight appear normal. Mood & affect appropriate.    Data Reviewed:  Lab results reviewed.  Family Communication: None  Disposition: Status is: Inpatient Remains inpatient appropriate because: Severity of disease.     Time spent: 35 minutes  Author: Marrion Coy, MD 07/17/2023 1:22 PM  For on call review [email or web address].

## 2023-07-17 NOTE — Progress Notes (Signed)
 PT Cancellation Note  Patient Details Name: [name] MRN: [id number] DOB: [birth year]   Cancelled Treatment:     PT attempt. Pt off floor for HD/ + has perm cath placement scheduled. Acute PT will continue to follow and progress per current POC.    Rushie Chestnut 07/17/2023, 9:42 AM

## 2023-07-17 NOTE — Plan of Care (Signed)

## 2023-07-17 NOTE — Progress Notes (Signed)
 [facility]  ROUNDING NOTE   Subjective:   Patient seen and evaluated during dialysis   HEMODIALYSIS FLOWSHEET:  Blood Flow Rate (mL/min): 399 mL/min Arterial Pressure (mmHg): -216.96 mmHg Venous Pressure (mmHg): 179.59 mmHg TMP (mmHg): 4.44 mmHg Ultrafiltration Rate (mL/min): 195 mL/min Dialysate Flow Rate (mL/min): 299 ml/min  Completely alert and oriented today Denies pain or discomfort Tolerating small meals   Objective:  Vital signs in last 24 hours:  Temp:  [97.6 F (36.4 C)-98.6 F (37 C)] 97.6 F (36.4 C) (03/06 0831) Pulse Rate:  [37-88] 74 (03/06 1045) Resp:  [14-20] 19 (03/06 1045) BP: (108-154)/(57-104) 121/104 (03/06 1045) SpO2:  [92 %-97 %] 97 % (03/06 1045) Weight:  [90.6 kg] 90.6 kg (03/06 0838)  Weight change:  Filed Weights   07/15/23 0836 07/15/23 1159 07/17/23 0838  Weight: 93.2 kg 93.2 kg 90.6 kg    Intake/Output: I/O last 3 completed shifts: In: -  Out: 100 [Urine:100]   Intake/Output this shift:  No intake/output data recorded.  Physical Exam: General: NAD  Head: Normocephalic, atraumatic. Moist oral mucosal membranes  Eyes: Anicteric  Lungs:  Clear to auscultation, normal effort  Heart: Regular rate and rhythm  Abdomen:  Soft, nontender, distention  Extremities: No peripheral edema.  Neurologic: Alert and awake, moving all four extremities  Skin: No lesions  Access: Rt femoral HD cath    Basic Metabolic Panel: Recent Labs  Lab 07/11/23 0438 07/12/23 0557 07/13/23 0722 07/14/23 0428 07/15/23 0411 07/16/23 0427 07/17/23 0419  NA 130* 133* 134* 136 136 136 138  K 3.4* 4.1 4.0 4.0 3.3* 3.1* 3.3*  CL 96* 101 103 102 99 101 99  CO2 19* 19* 19* 23 23 25 27   GLUCOSE 91 96 86 93 92 97 100*  BUN 48* 51* 40* 46* 33* 24* 32*  CREATININE 9.76* 10.00* 8.04* 7.93* 6.15* 4.08* 4.40*  CALCIUM 7.6* 7.9* 8.2* 8.6* 8.6* 8.9 9.2  MG 2.0 2.0 1.9 2.1 1.9  --   --   PHOS 5.1* 5.7*  --   --   --   --   --     Liver  Function Tests: No results for input(s): "AST", "ALT", "ALKPHOS", "BILITOT", "PROT", "ALBUMIN" in the last 168 hours.  No results for input(s): "LIPASE", "AMYLASE" in the last 168 hours. No results for input(s): "AMMONIA" in the last 168 hours.  CBC: Recent Labs  Lab 07/12/23 0852 07/14/23 0906 07/15/23 0853 07/17/23 0838  WBC 5.0 3.1* 6.3 5.7  HGB 11.3* 10.9* 10.8* 11.1*  HCT 33.8* 32.2* 31.3* 32.3*  MCV 94.4 92.8 94.6 93.4  PLT 71* 88* 96* 113*    Cardiac Enzymes: No results for input(s): "CKTOTAL", "CKMB", "CKMBINDEX", "TROPONINI" in the last 168 hours.  BNP: Invalid input(s): "POCBNP"  CBG: Recent Labs  Lab 07/11/23 2035 07/12/23 1118 07/13/23 0807 07/13/23 1126 07/15/23 1447  GLUCAP 99 94 77 165* 94    Microbiology: Results for orders placed or performed during the hospital encounter of 07/09/23  Culture, blood (Routine X 2) w Reflex to ID Panel     Status: None (Preliminary result)   Collection Time: 07/15/23 10:02 PM   Specimen: BLOOD  Result Value Ref Range Status   Specimen Description BLOOD LEFT ARM  Final   Special Requests   Final    BOTTLES DRAWN AEROBIC AND ANAEROBIC Blood Culture adequate volume   Culture   Final    NO GROWTH 2 DAYS Performed at [facility], [address], [place], [place]  [address]    Report Status PENDING  Incomplete  Culture, blood (Routine X 2) w Reflex to ID Panel     Status: None (Preliminary result)   Collection Time: 07/15/23 10:02 PM   Specimen: BLOOD  Result Value Ref Range Status   Specimen Description BLOOD RIGHT ARM  Final   Special Requests   Final    BOTTLES DRAWN AEROBIC AND ANAEROBIC Blood Culture adequate volume   Culture   Final    NO GROWTH 2 DAYS Performed at [facility], [address], [place], [place] [address]    Report Status PENDING  Incomplete  Resp panel by RT-PCR (RSV, Flu A&B, Covid) Anterior Nasal Swab     Status: None   Collection Time: 07/15/23 10:24 PM   Specimen:  Anterior Nasal Swab  Result Value Ref Range Status   SARS Coronavirus 2 by RT PCR NEGATIVE NEGATIVE Final    Comment: (NOTE) SARS-CoV-2 target nucleic acids are NOT DETECTED.  The SARS-CoV-2 RNA is generally detectable in upper respiratory specimens during the acute phase of infection. The lowest concentration of SARS-CoV-2 viral copies this assay can detect is 138 copies/mL. A negative result does not preclude SARS-Cov-2 infection and should not be used as the sole basis for treatment or other patient management decisions. A negative result may occur with  improper specimen collection/handling, submission of specimen other than nasopharyngeal swab, presence of viral mutation(s) within the areas targeted by this assay, and inadequate number of viral copies(<138 copies/mL). A negative result must be combined with clinical observations, patient history, and epidemiological information. The expected result is Negative.  Fact Sheet for Patients:  BloggerCourse.com  Fact Sheet for Healthcare Providers:  SeriousBroker.it  This test is no t yet approved or cleared by the [place] FDA and  has been authorized for detection and/or diagnosis of SARS-CoV-2 by FDA under an Emergency Use Authorization (EUA). This EUA will remain  in effect (meaning this test can be used) for the duration of the COVID-19 declaration under Section 564(b)(1) of the Act, 21 U.S.C.section 360bbb-3(b)(1), unless the authorization is terminated  or revoked sooner.       Influenza A by PCR NEGATIVE NEGATIVE Final   Influenza B by PCR NEGATIVE NEGATIVE Final    Comment: (NOTE) The Xpert Xpress SARS-CoV-2/FLU/RSV plus assay is intended as an aid in the diagnosis of influenza from Nasopharyngeal swab specimens and should not be used as a sole basis for treatment. Nasal washings and aspirates are unacceptable for Xpert Xpress SARS-CoV-2/FLU/RSV testing.  Fact  Sheet for Patients: BloggerCourse.com  Fact Sheet for Healthcare Providers: SeriousBroker.it  This test is not yet approved or cleared by the [place] FDA and has been authorized for detection and/or diagnosis of SARS-CoV-2 by FDA under an Emergency Use Authorization (EUA). This EUA will remain in effect (meaning this test can be used) for the duration of the COVID-19 declaration under Section 564(b)(1) of the Act, 21 U.S.C. section 360bbb-3(b)(1), unless the authorization is terminated or revoked.     Resp Syncytial Virus by PCR NEGATIVE NEGATIVE Final    Comment: (NOTE) Fact Sheet for Patients: BloggerCourse.com  Fact Sheet for Healthcare Providers: SeriousBroker.it  This test is not yet approved or cleared by the [place] FDA and has been authorized for detection and/or diagnosis of SARS-CoV-2 by FDA under an Emergency Use Authorization (EUA). This EUA will remain in effect (meaning this test can be used) for the duration of the COVID-19 declaration under Section 564(b)(1) of the Act,  21 U.S.C. section 360bbb-3(b)(1), unless the authorization is terminated or revoked.  Performed at [facility], [address] [place], [place] [address]     Coagulation Studies: No results for input(s): "LABPROT", "INR" in the last 72 hours.   Urinalysis: No results for input(s): "COLORURINE", "LABSPEC", "PHURINE", "GLUCOSEU", "HGBUR", "BILIRUBINUR", "KETONESUR", "PROTEINUR", "UROBILINOGEN", "NITRITE", "LEUKOCYTESUR" in the last 72 hours.  Invalid input(s): "APPERANCEUR"     Imaging: DG Chest 2 View Result Date: 07/16/2023 CLINICAL DATA:  Fever. EXAM: CHEST - 2 VIEW COMPARISON:  07/09/2023 FINDINGS: Right lung clear. Subtle retrocardiac left base atelectasis or infiltrate is new in the interval. No pulmonary edema or pleural effusion. The cardiopericardial silhouette  is within normal limits for size. No acute bony abnormality. IMPRESSION: Retrocardiac left base atelectasis or infiltrate is new in the interval. Electronically Signed   By: Kennith Center M.D.   On: 07/16/2023 12:16      Medications:      amitriptyline  75 mg Oral QHS   aspirin EC  81 mg Oral Daily   Chlorhexidine Gluconate Cloth  6 each Topical Q0600   Chlorhexidine Gluconate Cloth  6 each Topical Q0600   vitamin B-12  1,000 mcg Oral Daily   folic acid  1 mg Oral Daily   heparin injection (subcutaneous)  5,000 Units Subcutaneous Q8H   multivitamin with minerals  1 tablet Oral Daily   nicotine  14 mg Transdermal Daily   potassium chloride  20 mEq Oral Once   QUEtiapine  50 mg Oral QHS   rosuvastatin  40 mg Oral Daily   thiamine  100 mg Oral Daily   acetaminophen **OR** acetaminophen, albuterol, haloperidol lactate, HYDROcodone-acetaminophen, ondansetron **OR** ondansetron (ZOFRAN) West, mouth rinse  Assessment/ Plan:  [name] is a 73 y.o.  male with past medical conditions including alcohol use disorder, COPD with 2 L nasal cannula, diet-controlled diabetes, hepatitis C, seizure disorder, hypertension, and vitamin B12 deficiency, who was admitted to [facility] on 07/09/2023 for Thrombocytopenia (HCC) [D69.6] AKI (acute kidney injury) (HCC) [N17.9] Frequent falls [R29.6] Increased anion gap metabolic acidosis [E87.29] Injury of head, initial encounter [S09.90XA]   Acute kidney injury likely secondary to dehydration with NSAID use. Normal renal function noted 2 weeks prior. Renal ultrasound negative for hydronephrosis. No recent contrast exposure. Continue holding ibuprofen and lisinopril.  Autoimmune and complements within desired range. Kappa lambda ratio elevated, not uncommon with renal failure. Patient continues to require dialysis.  Vascular to place tunneled access later today.  Receiving dialysis with no UF. Has been producing more urine.   Renal navigator aware  of patient, outpatient clinic search underway.    Lab Results  Component Value Date   CREATININE 4.40 (H) 07/17/2023   CREATININE 4.08 (H) 07/16/2023   CREATININE 6.15 (H) 07/15/2023   No intake or output data in the 24 hours ending 07/17/23 1054  2.  Hypertension, essential.  Home regimen includes amlodipine and lisinopril.  Both currently held.   Blood pressure 118/72 during dialysis   3. Diabetes mellitus type II, renal manifestations: noninsulin dependent. Most recent hemoglobin A1c is 6.1 on 06/25/23.    4. Anemia with thrombocytopenia Lab Results  Component Value Date   HGB 11.1 (L) 07/17/2023    Hemoglobin 11.1.  Will continue to monitor and consider ESA below 10.    LOS: 8 [name] 3/6/202510:54 AM

## 2023-07-17 NOTE — TOC Progression Note (Signed)
 Transition of Care [facility]) - Progression Note    Patient Details  Name: [name] MRN: [id number] Date of Birth: [birth year]  [facility] CM/SW Contact  Chapman Fitch, RN Phone Number: 07/17/2023, 1:48 PM  Clinical Narrative:        Outpatient HD chair time pending  Patient will require auth.  MD is aware and I have requested he notify to [facility] when closer to being medically ready for auth to be initiated     Expected Discharge Plan and Services                                               Social Determinants of Health (SDOH) Interventions SDOH Screenings   Food Insecurity: No Food Insecurity (07/10/2023)  Housing: Low Risk  (07/10/2023)  Transportation Needs: No Transportation Needs (07/10/2023)  Utilities: Not At Risk (07/10/2023)  Alcohol Screen: Low Risk  (10/28/2022)  Depression (PHQ2-9): Low Risk  (06/25/2023)  Financial Resource Strain: Low Risk  (10/22/2022)  Physical Activity: Inactive (10/22/2022)  Social Connections: Socially Isolated (07/10/2023)  Stress: Stress Concern Present (01/29/2023)  Tobacco Use: Medium Risk (07/09/2023)    Readmission Risk Interventions     No data to display

## 2023-07-17 NOTE — Procedures (Signed)
 Received patient in bed to unit.  Alert and oriented.  Informed consent signed and in chart.   TX duration:3.5hrs  Patient tolerated well.  Transported back to the room  Alert, without acute distress.  Hand-off given to patient's nurse.   Access used: Right groin HD catheter.  Access issues: NONE  Total UF removed: 0  Medication(s) given: NONE  [facility]

## 2023-07-17 NOTE — Progress Notes (Signed)
 Progress Note    07/17/2023 7:54 AM 6 Days Post-Op  Subjective:  This is a 73 y.o. 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  He feels like his knees just gives out and he falls.  He hit his head on 1 occasion but did not lose consciousness.    Upon work up for generalized weakness the patient was noted to have elevated of 8029 up from 1.2 and elevated BUN up to 38. Today's Bun + 48 and Creatinine up to 9.76. Nephrology recommends starting hemodialysis. Vascular surgery now requested to place dialysis Perma catheter.    Vitals:   07/17/23 0327 07/17/23 0720  BP: 122/66 129/83  Pulse: 67 88  Resp: 20 18  Temp: 98.6 F (37 C) 98.5 F (36.9 C)  SpO2: 94% 97%   Physical Exam: Cardiac:  RRR, Normal S!, S2. No murmurs Lungs:  Clear on auscultation. Non labored breathing.  Incisions:  None Extremities:  Right femoral groin temp cath in place. Bilateral lower extremity pulses palpable. Warm to touch.  Abdomen:  Positive bowel sounds throughout, soft, non tender and non distended.  Neurologic: AAOX3 Answers all questions and follows commands   CBC    Component Value Date/Time   WBC 6.3 07/15/2023 0853   RBC 3.31 (L) 07/15/2023 0853   HGB 10.8 (L) 07/15/2023 0853   HGB 14.3 06/25/2023 1136   HCT 31.3 (L) 07/15/2023 0853   HCT 42.8 06/25/2023 1136   PLT 96 (L) 07/15/2023 0853   PLT 155 06/25/2023 1136   MCV 94.6 07/15/2023 0853   MCV 97 06/25/2023 1136   MCV 94 12/31/2013 1207   MCH 32.6 07/15/2023 0853   MCHC 34.5 07/15/2023 0853   RDW 15.9 (H) 07/15/2023 0853   RDW 15.8 (H) 06/25/2023 1136   RDW 14.3 12/31/2013 1207   LYMPHSABS 0.7 07/09/2023 1916   LYMPHSABS 1.3 01/24/2023 1435   LYMPHSABS 2.8  06/08/2013 0423   MONOABS 0.4 07/09/2023 1916   MONOABS 0.9 06/08/2013 0423   EOSABS 0.3 07/09/2023 1916   EOSABS 0.2 01/24/2023 1435   EOSABS 0.1 06/08/2013 0423   BASOSABS 0.0 07/09/2023 1916   BASOSABS 0.0 01/24/2023 1435   BASOSABS 0.0 06/08/2013 0423    BMET    Component Value Date/Time   NA 138 07/17/2023 0419   NA 140 06/25/2023 1136   NA 139 12/31/2013 1207   K 3.3 (L) 07/17/2023 0419   K 3.8 12/31/2013 1207   CL 99 07/17/2023 0419   CL 101 12/31/2013 1207   CO2 27 07/17/2023 0419   CO2 28 12/31/2013 1207   GLUCOSE 100 (H) 07/17/2023 0419   GLUCOSE 134 (H) 12/31/2013 1207   BUN 32 (H) 07/17/2023 0419   BUN 16 06/25/2023 1136   BUN 8 12/31/2013 1207   CREATININE 4.40 (H) 07/17/2023 0419   CREATININE 1.03 12/31/2013 1207   CALCIUM 9.2 07/17/2023 0419   CALCIUM 8.8 12/31/2013 1207   GFRNONAA 14 (L) 07/17/2023 0419   GFRNONAA >60 12/31/2013 1207   GFRAA >60 12/11/2019 0531   GFRAA >60 12/31/2013 1207    INR    Component Value Date/Time   INR 1.1 07/09/2023 2340    No intake or output data in the 24 hours ending 07/17/23 0754   Assessment/Plan:  73 y.o. male is s/p temporary dialysis catheter.  6 Days Post-Op   Plan Vascular Surgery plans on taking the patient to the vascular lab for placement of dialysis perma catheter for long term hemodialysis. I discussed in detail at the bedside this morning with the patient the procedure, benefits, risks and complications. Patient verbalizes his understanding and wishes to proceed. I answered all the patients questions this morning. Patient endorses he has been NPO since midnight.    DVT prophylaxis:  Heparin with Dialysis   Marcie Bal [facility] 07/17/2023 7:54 AM

## 2023-07-18 ENCOUNTER — Encounter: Payer: Self-pay | Admitting: Internal Medicine

## 2023-07-18 ENCOUNTER — Encounter
Admission: EM | Disposition: A | Discharge status: Home Health | Payer: Self-pay | Source: Home / Self Care | Attending: Internal Medicine

## 2023-07-18 DIAGNOSIS — N186 End stage renal disease: Secondary | ICD-10-CM

## 2023-07-18 DIAGNOSIS — E538 Deficiency of other specified B group vitamins: Secondary | ICD-10-CM | POA: Diagnosis not present

## 2023-07-18 DIAGNOSIS — E119 Type 2 diabetes mellitus without complications: Secondary | ICD-10-CM | POA: Diagnosis not present

## 2023-07-18 DIAGNOSIS — N179 Acute kidney failure, unspecified: Secondary | ICD-10-CM | POA: Diagnosis not present

## 2023-07-18 HISTORY — PX: DIALYSIS/PERMA CATHETER INSERTION: CATH118288

## 2023-07-18 LAB — BASIC METABOLIC PANEL
Anion gap: 12 (ref 5–15)
BUN: 21 mg/dL (ref 8–23)
CO2: 28 mmol/L (ref 22–32)
Calcium: 9.3 mg/dL (ref 8.9–10.3)
Chloride: 97 mmol/L — ABNORMAL LOW (ref 98–111)
Creatinine, Ser: 2.86 mg/dL — ABNORMAL HIGH (ref 0.61–1.24)
GFR, Estimated: 23 mL/min — ABNORMAL LOW (ref >60)
Glucose, Bld: 111 mg/dL — ABNORMAL HIGH (ref 70–99)
Potassium: 3.7 mmol/L (ref 3.5–5.1)
Sodium: 137 mmol/L (ref 135–145)

## 2023-07-18 SURGERY — DIALYSIS/PERMA CATHETER INSERTION
Anesthesia: Moderate Sedation

## 2023-07-18 MED ORDER — FENTANYL CITRATE (PF) 100 MCG/2ML IJ SOLN
INTRAMUSCULAR | Status: DC | PRN
  Administered 2023-07-18: 50 ug via INTRAVENOUS

## 2023-07-18 MED ORDER — CEFAZOLIN SODIUM-DEXTROSE 1-4 GM/50ML-% IV SOLN
1.0000 g | INTRAVENOUS | Status: AC
  Administered 2023-07-18: 1 g via INTRAVENOUS

## 2023-07-18 MED ORDER — SODIUM CHLORIDE 0.9 % IV SOLN: INTRAVENOUS | Status: DC

## 2023-07-18 MED ORDER — DIPHENHYDRAMINE HCL 50 MG/ML IJ SOLN: 50.0000 mg | Freq: Once | INTRAMUSCULAR | Status: DC | PRN

## 2023-07-18 MED ORDER — HEPARIN (PORCINE) IN NACL 1000-0.9 UT/500ML-% IV SOLN
INTRAVENOUS | Status: DC | PRN
  Administered 2023-07-18: 500 mL

## 2023-07-18 MED ORDER — MIDAZOLAM HCL 2 MG/2ML IJ SOLN
INTRAMUSCULAR | Status: DC | PRN
  Administered 2023-07-18: 1 mg via INTRAVENOUS

## 2023-07-18 MED ORDER — FENTANYL CITRATE PF 50 MCG/ML IJ SOSY: 12.5000 ug | PREFILLED_SYRINGE | Freq: Once | INTRAMUSCULAR | Status: DC | PRN

## 2023-07-18 MED ORDER — MIDAZOLAM HCL 2 MG/ML PO SYRP: 8.0000 mg | ORAL_SOLUTION | Freq: Once | ORAL | Status: DC | PRN

## 2023-07-18 MED ORDER — MIDAZOLAM HCL 5 MG/5ML IJ SOLN
INTRAMUSCULAR | Status: AC
  Filled 2023-07-18: qty 5

## 2023-07-18 MED ORDER — CEFAZOLIN SODIUM-DEXTROSE 1-4 GM/50ML-% IV SOLN
INTRAVENOUS | Status: AC
  Filled 2023-07-18: qty 50

## 2023-07-18 MED ORDER — LIDOCAINE-EPINEPHRINE (PF) 1 %-1:200000 IJ SOLN
INTRAMUSCULAR | Status: DC | PRN
Start: 2023-07-18 — End: 2023-07-18
  Administered 2023-07-18: 20 mL via INTRADERMAL

## 2023-07-18 MED ORDER — HEPARIN SODIUM (PORCINE) 10000 UNIT/ML IJ SOLN
INTRAMUSCULAR | Status: DC | PRN
  Administered 2023-07-18: 10000 [IU]

## 2023-07-18 MED ORDER — FENTANYL CITRATE (PF) 100 MCG/2ML IJ SOLN
INTRAMUSCULAR | Status: AC
  Filled 2023-07-18: qty 2

## 2023-07-18 MED ORDER — METHYLPREDNISOLONE SODIUM SUCC 125 MG IJ SOLR: 125.0000 mg | Freq: Once | INTRAMUSCULAR | Status: DC | PRN

## 2023-07-18 MED ORDER — FAMOTIDINE 20 MG PO TABS: 40.0000 mg | ORAL_TABLET | Freq: Once | ORAL | Status: DC | PRN

## 2023-07-18 SURGICAL SUPPLY — 6 items
CATH CANNON HEMO 15FR 19 (HEMODIALYSIS SUPPLIES) IMPLANT
COVER PROBE ULTRASOUND 5X96 (MISCELLANEOUS) IMPLANT
DERMABOND ADVANCED .7 DNX12 (GAUZE/BANDAGES/DRESSINGS) IMPLANT
GLIDEWIRE ADV .035X180CM (WIRE) IMPLANT
SUT MNCRL AB 4-0 PS2 18 (SUTURE) IMPLANT
SUT PROLENE 0 CT 1 30 (SUTURE) IMPLANT

## 2023-07-18 NOTE — Interval H&P Note (Signed)
 History and Physical Interval Note:  07/18/2023 8:13 AM  [name] IV  has presented today for surgery, with the diagnosis of ESRD.  The various methods of treatment have been discussed with the patient and family. After consideration of risks, benefits and other options for treatment, the patient has consented to  Procedure(s): DIALYSIS/PERMA CATHETER INSERTION (N/A) as a surgical intervention.  The patient's history has been reviewed, patient examined, no change in status, stable for surgery.  I have reviewed the patient's chart and labs.  Questions were answered to the patient's satisfaction.     Festus Barren

## 2023-07-18 NOTE — Progress Notes (Signed)
 [facility]  ROUNDING NOTE   Subjective:   Patient seen sitting up in bed Alert and oriented Reports he is urinating frequently Minimal documentation  NPO  for permcath placement    Objective:  Vital signs in last 24 hours:  Temp:  [97.8 F (36.6 C)-98.6 F (37 C)] 98.1 F (36.7 C) (03/07 0811) Pulse Rate:  [0-87] 73 (03/07 1015) Resp:  [15-21] 17 (03/07 1015) BP: (126-158)/(56-122) 140/65 (03/07 1015) SpO2:  [92 %-100 %] 93 % (03/07 1015) Weight:  [90.6 kg] 90.6 kg (03/07 0811)  Weight change:  Filed Weights   07/17/23 0838 07/17/23 1229 07/18/23 0811  Weight: 90.6 kg 90.6 kg 90.6 kg    Intake/Output: I/O last 3 completed shifts: In: -  Out: 150 [Urine:150]   Intake/Output this shift:  Total I/O In: 539.2 [P.O.:480; I.V.:12.5; IV Piggyback:46.7] Out: -   Physical Exam: General: NAD  Head: Normocephalic, atraumatic. Moist oral mucosal membranes  Eyes: Anicteric  Lungs:  Clear to auscultation, normal effort  Heart: Regular rate and rhythm  Abdomen:  Soft, nontender, distention  Extremities: No peripheral edema.  Neurologic: Alert and awake, moving all four extremities  Skin: No lesions  Access: Rt femoral HD cath, Rt internal jugular permcath    Basic Metabolic Panel: Recent Labs  Lab 07/12/23 0557 07/13/23 0722 07/14/23 0428 07/15/23 0411 07/16/23 0427 07/17/23 0419 07/18/23 0416  NA 133* 134* 136 136 136 138 137  K 4.1 4.0 4.0 3.3* 3.1* 3.3* 3.7  CL 101 103 102 99 101 99 97*  CO2 19* 19* 23 23 25 27 28   GLUCOSE 96 86 93 92 97 100* 111*  BUN 51* 40* 46* 33* 24* 32* 21  CREATININE 10.00* 8.04* 7.93* 6.15* 4.08* 4.40* 2.86*  CALCIUM 7.9* 8.2* 8.6* 8.6* 8.9 9.2 9.3  MG 2.0 1.9 2.1 1.9  --   --   --   PHOS 5.7*  --   --   --   --   --   --     Liver Function Tests: No results for input(s): "AST", "ALT", "ALKPHOS", "BILITOT", "PROT", "ALBUMIN" in the last 168 hours.  No results for input(s): "LIPASE", "AMYLASE" in the last 168  hours. No results for input(s): "AMMONIA" in the last 168 hours.  CBC: Recent Labs  Lab 07/12/23 0852 07/14/23 0906 07/15/23 0853 07/17/23 0838  WBC 5.0 3.1* 6.3 5.7  HGB 11.3* 10.9* 10.8* 11.1*  HCT 33.8* 32.2* 31.3* 32.3*  MCV 94.4 92.8 94.6 93.4  PLT 71* 88* 96* 113*    Cardiac Enzymes: No results for input(s): "CKTOTAL", "CKMB", "CKMBINDEX", "TROPONINI" in the last 168 hours.  BNP: Invalid input(s): "POCBNP"  CBG: Recent Labs  Lab 07/11/23 2035 07/12/23 1118 07/13/23 0807 07/13/23 1126 07/15/23 1447  GLUCAP 99 94 77 165* 94    Microbiology: Results for orders placed or performed during the hospital encounter of 07/09/23  Culture, blood (Routine X 2) w Reflex to ID Panel     Status: None (Preliminary result)   Collection Time: 07/15/23 10:02 PM   Specimen: BLOOD  Result Value Ref Range Status   Specimen Description BLOOD LEFT ARM  Final   Special Requests   Final    BOTTLES DRAWN AEROBIC AND ANAEROBIC Blood Culture adequate volume   Culture   Final    NO GROWTH 3 DAYS Performed at [facility], [address] [place], [place] [address]    Report Status PENDING  Incomplete  Culture, blood (Routine X 2) w Reflex  to ID Panel     Status: None (Preliminary result)   Collection Time: 07/15/23 10:02 PM   Specimen: BLOOD  Result Value Ref Range Status   Specimen Description BLOOD RIGHT ARM  Final   Special Requests   Final    BOTTLES DRAWN AEROBIC AND ANAEROBIC Blood Culture adequate volume   Culture   Final    NO GROWTH 3 DAYS Performed at [facility], [address] [place], [place] [address]    Report Status PENDING  Incomplete  Resp panel by RT-PCR (RSV, Flu A&B, Covid) Anterior Nasal Swab     Status: None   Collection Time: 07/15/23 10:24 PM   Specimen: Anterior Nasal Swab  Result Value Ref Range Status   SARS Coronavirus 2 by RT PCR NEGATIVE NEGATIVE Final    Comment: (NOTE) SARS-CoV-2 target nucleic acids are NOT  DETECTED.  The SARS-CoV-2 RNA is generally detectable in upper respiratory specimens during the acute phase of infection. The lowest concentration of SARS-CoV-2 viral copies this assay can detect is 138 copies/mL. A negative result does not preclude SARS-Cov-2 infection and should not be used as the sole basis for treatment or other patient management decisions. A negative result may occur with  improper specimen collection/handling, submission of specimen other than nasopharyngeal swab, presence of viral mutation(s) within the areas targeted by this assay, and inadequate number of viral copies(<138 copies/mL). A negative result must be combined with clinical observations, patient history, and epidemiological information. The expected result is Negative.  Fact Sheet for Patients:  BloggerCourse.com  Fact Sheet for Healthcare Providers:  SeriousBroker.it  This test is no t yet approved or cleared by the [place] FDA and  has been authorized for detection and/or diagnosis of SARS-CoV-2 by FDA under an Emergency Use Authorization (EUA). This EUA will remain  in effect (meaning this test can be used) for the duration of the COVID-19 declaration under Section 564(b)(1) of the Act, 21 U.S.C.section 360bbb-3(b)(1), unless the authorization is terminated  or revoked sooner.       Influenza A by PCR NEGATIVE NEGATIVE Final   Influenza B by PCR NEGATIVE NEGATIVE Final    Comment: (NOTE) The Xpert Xpress SARS-CoV-2/FLU/RSV plus assay is intended as an aid in the diagnosis of influenza from Nasopharyngeal swab specimens and should not be used as a sole basis for treatment. Nasal washings and aspirates are unacceptable for Xpert Xpress SARS-CoV-2/FLU/RSV testing.  Fact Sheet for Patients: BloggerCourse.com  Fact Sheet for Healthcare Providers: SeriousBroker.it  This test is not yet  approved or cleared by the [place] FDA and has been authorized for detection and/or diagnosis of SARS-CoV-2 by FDA under an Emergency Use Authorization (EUA). This EUA will remain in effect (meaning this test can be used) for the duration of the COVID-19 declaration under Section 564(b)(1) of the Act, 21 U.S.C. section 360bbb-3(b)(1), unless the authorization is terminated or revoked.     Resp Syncytial Virus by PCR NEGATIVE NEGATIVE Final    Comment: (NOTE) Fact Sheet for Patients: BloggerCourse.com  Fact Sheet for Healthcare Providers: SeriousBroker.it  This test is not yet approved or cleared by the [place] FDA and has been authorized for detection and/or diagnosis of SARS-CoV-2 by FDA under an Emergency Use Authorization (EUA). This EUA will remain in effect (meaning this test can be used) for the duration of the COVID-19 declaration under Section 564(b)(1) of the Act, 21 U.S.C. section 360bbb-3(b)(1), unless the authorization is terminated or revoked.  Performed at [facility],  [address], [place], [place] [address]     Coagulation Studies: No results for input(s): "LABPROT", "INR" in the last 72 hours.   Urinalysis: No results for input(s): "COLORURINE", "LABSPEC", "PHURINE", "GLUCOSEU", "HGBUR", "BILIRUBINUR", "KETONESUR", "PROTEINUR", "UROBILINOGEN", "NITRITE", "LEUKOCYTESUR" in the last 72 hours.  Invalid input(s): "APPERANCEUR"     Imaging: PERIPHERAL VASCULAR CATHETERIZATION Result Date: 07/18/2023 See surgical note for result.     Medications:      amitriptyline  75 mg Oral QHS   aspirin EC  81 mg Oral Daily   Chlorhexidine Gluconate Cloth  6 each Topical Q0600   Chlorhexidine Gluconate Cloth  6 each Topical Q0600   vitamin B-12  1,000 mcg Oral Daily   feeding supplement  237 mL Oral BID BM   folic acid  1 mg Oral Daily   heparin injection (subcutaneous)  5,000 Units  Subcutaneous Q8H   multivitamin with minerals  1 tablet Oral Daily   nicotine  14 mg Transdermal Daily   QUEtiapine  50 mg Oral QHS   rosuvastatin  40 mg Oral Daily   thiamine  100 mg Oral Daily   acetaminophen **OR** acetaminophen, albuterol, haloperidol lactate, heparin sodium (porcine), HYDROcodone-acetaminophen, ondansetron **OR** ondansetron (ZOFRAN) IV, mouth rinse  Assessment/ Plan:  Mr. MASON DIBIASIO IV is a 73 y.o.  male with past medical conditions including alcohol use disorder, COPD with 2 L nasal cannula, diet-controlled diabetes, hepatitis C, seizure disorder, hypertension, and vitamin B12 deficiency, who was admitted to [facility] on 07/09/2023 for Thrombocytopenia (HCC) [D69.6] AKI (acute kidney injury) (HCC) [N17.9] Frequent falls [R29.6] Increased anion gap metabolic acidosis [E87.29] Injury of head, initial encounter [S09.90XA]   Acute kidney injury likely secondary to dehydration with NSAID use. Normal renal function noted 2 weeks prior. Renal ultrasound negative for hydronephrosis. No recent contrast exposure. Continue holding ibuprofen and lisinopril.  Autoimmune and complements within desired range. Kappa lambda ratio elevated, not uncommon with renal failure. Appreciate vascular placing permcath today.  Will place order to remove Rt femoral  HD temp cath.  Next treatment scheduled for Saturday  Renal navigator aware of patient, outpatient clinic search underway.    Lab Results  Component Value Date   CREATININE 2.86 (H) 07/18/2023   CREATININE 4.40 (H) 07/17/2023   CREATININE 4.08 (H) 07/16/2023    Intake/Output Summary (Last 24 hours) at 07/18/2023 1135 Last data filed at 07/18/2023 1000 Gross per 24 hour  Intake 539.17 ml  Output 150 ml  Net 389.17 ml    2.  Hypertension, essential.  Home regimen includes amlodipine and lisinopril.  Both currently held.   Blood pressure stable   3. Diabetes mellitus type II, renal manifestations: noninsulin dependent.  Most recent hemoglobin A1c is 6.1 on 06/25/23.    Diet controlled  4. Anemia with thrombocytopenia Lab Results  Component Value Date   HGB 11.1 (L) 07/17/2023    Will continue to monitor and consider ESA below 10.    LOS: 9 Kinleigh Nault 3/7/202511:35 AM

## 2023-07-18 NOTE — Op Note (Signed)
 OPERATIVE NOTE    PRE-OPERATIVE DIAGNOSIS: 1. ESRD   POST-OPERATIVE DIAGNOSIS: same as above  PROCEDURE: Ultrasound guidance for vascular access to the right internal jugular vein Fluoroscopic guidance for placement of catheter Placement of a 19 cm tip to cuff tunneled hemodialysis catheter via the right internal jugular vein  SURGEON: Festus Barren, MD  ANESTHESIA:  Local with Moderate conscious sedation for approximately 17 minutes using 1 mg of Versed and 50 mcg of Fentanyl  ESTIMATED BLOOD LOSS: 5 cc  FLUORO TIME: less than one minute  CONTRAST: none  FINDING(S): 1.  Patent right internal jugular vein  SPECIMEN(S):  None  INDICATIONS:   [name] is a 73 y.o.male who presents with renal failure.  The patient needs long term dialysis access for their ESRD, and a Permcath is necessary.  Risks and benefits are discussed and informed consent is obtained.    DESCRIPTION: After obtaining full informed written consent, the patient was brought back to the vascular suited. The patient's right neck and chest were sterilely prepped and draped in a sterile surgical field was created. Moderate conscious sedation was administered during a face to face encounter with the patient throughout the procedure with my supervision of the RN administering medicines and monitoring the patient's vital signs, pulse oximetry, telemetry and mental status throughout from the start of the procedure until the patient was taken to the recovery room.  The right internal jugular vein was visualized with ultrasound and found to be patent. It was then accessed under direct ultrasound guidance and a permanent image was recorded. A wire was placed. After skin nick and dilatation, the peel-away sheath was placed over the wire. I then turned my attention to an area under the clavicle. Approximately 1-2 fingerbreadths below the clavicle a small counterincision was created and tunneled from the subclavicular  incision to the access site. Using fluoroscopic guidance, a 19 centimeter tip to cuff tunneled hemodialysis catheter was selected, and tunneled from the subclavicular incision to the access site. It was then placed through the peel-away sheath and the peel-away sheath was removed. Using fluoroscopic guidance the catheter tips were parked in the right atrium. The appropriate distal connectors were placed. It withdrew blood well and flushed easily with heparinized saline and a concentrated heparin solution was then placed. It was secured to the chest wall with 2 Prolene sutures. The access incision was closed single 4-0 Monocryl. A 4-0 Monocryl pursestring suture was placed around the exit site. Sterile dressings were placed. The patient tolerated the procedure well and was taken to the recovery room in stable condition.  COMPLICATIONS: None  CONDITION: Stable  Festus Barren, MD 07/18/2023 9:21 AM   This note was created with Dragon Medical transcription system. Any errors in dictation are purely unintentional.

## 2023-07-18 NOTE — Plan of Care (Signed)
  Problem: Education: Goal: Ability to describe self-care measures that may prevent or decrease complications (Diabetes Survival Skills Education) will improve Outcome: Progressing Goal: Individualized Educational Video(s) Outcome: Progressing   Problem: Coping: Goal: Ability to adjust to condition or change in health will improve Outcome: Progressing   Problem: Fluid Volume: Goal: Ability to maintain a balanced intake and output will improve Outcome: Progressing   Problem: Health Behavior/Discharge Planning: Goal: Ability to identify and utilize available resources and services will improve Outcome: Progressing Goal: Ability to manage health-related needs will improve Outcome: Progressing   Problem: Metabolic: Goal: Ability to maintain appropriate glucose levels will improve Outcome: Progressing   Problem: Nutritional: Goal: Maintenance of adequate nutrition will improve Outcome: Progressing Goal: Progress toward achieving an optimal weight will improve Outcome: Progressing   Problem: Skin Integrity: Goal: Risk for impaired skin integrity will decrease Outcome: Progressing   Problem: Tissue Perfusion: Goal: Adequacy of tissue perfusion will improve Outcome: Progressing   Problem: Health Behavior/Discharge Planning: Goal: Ability to manage health-related needs will improve Outcome: Progressing   Problem: Education: Goal: Knowledge of General Education information will improve Description: Including pain rating scale, medication(s)/side effects and non-pharmacologic comfort measures Outcome: Progressing   Problem: Health Behavior/Discharge Planning: Goal: Ability to manage health-related needs will improve Outcome: Progressing   Problem: Clinical Measurements: Goal: Ability to maintain clinical measurements within normal limits will improve Outcome: Progressing Goal: Will remain free from infection Outcome: Progressing Goal: Diagnostic test results will  improve Outcome: Progressing Goal: Respiratory complications will improve Outcome: Progressing Goal: Cardiovascular complication will be avoided Outcome: Progressing

## 2023-07-18 NOTE — Plan of Care (Signed)

## 2023-07-18 NOTE — Progress Notes (Signed)
 Progress Note   Patient: [name] FAO:[id number] DOB: 09/05/1950 DOA: 07/09/2023     9 DOS: the patient was seen and examined on 07/18/2023   Brief hospital course: [name] is a 73 y.o. 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  Patient was found to have oliguric acute renal failure, renal function not improving after West fluids, dialysis was started on 3/1.   Principal Problem:   AKI (acute kidney injury) (HCC) Active Problems:   High anion gap metabolic acidosis   Thrombocytopenia (HCC)   Anemia   Frequent falls   Complex regional pain syndrome of both lower extremities   Vitamin B12 deficiency   COPD (chronic obstructive pulmonary disease) (HCC)   Chronic respiratory failure with hypoxia (HCC)   Seizure disorder (HCC)   Essential hypertension   Tobacco use disorder   History of hepatitis C   Nasopharyngeal carcinoma (HCC)   Type 2 diabetes mellitus without complication, without long-term current use of insulin (HCC)   Dementia without behavioral disturbance (HCC)   Alcohol use disorder   Class 1 obesity   Assessment and Plan:  AKI (acute kidney injury) (HCC) secondary to ATN, now needing dialysis High anion gap metabolic acidosis Hypokalemia. --unclear etiology.  Pos ibuprofen use, recent lisinopril.  Oliguric.   --nephrology consulted, started on dialysis on 3/1 Renal function stabilizing/improving. Discussed with nephrology for outpatient dialysis plan and permacath. Permacath placed on 3/7.  Currently pending nursing home placement.   Vitamin B12 anemia Thrombocytopenia Condition is still stable, started oral B12.   Frequent falls Generalized weakness Nursing  home placement.   diabetic polyneuropathy Complex regional pain syndrome of both lower extremities --taper down home gabapentin due to kidney fxn decline   COPD (chronic obstructive pulmonary disease) (HCC) Chronic respiratory failure with hypoxia Not acutely exacerbated   Alcohol use disorder Thiamine folate and multivitamin, no alcohol withdrawal while in the hospital.   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  Followed by oncology and ENT   History of hepatitis C --hep C RNA pending, follow-up with infect disease after discharge.   Tobacco use disorder Nicotine patch   Essential hypertension Blood pressure varied --hold BP meds   Seizure disorder (HCC) Not on specific AEDs   Fever --100.8 early morning of 3/4 Respiratory panel was negative, no fever over the last 24 hours. Recent UA negative.  I obtained a chest x-ray, no evidence of pneumonia. Repeat blood culture negative for 2 days. No additional fever, antibiotics not needed.      Subjective:  Patient doing well today, no fever or chills.  No short of breath.  Physical Exam: Vitals:   07/18/23 1015 07/18/23 1206 07/18/23 1236 07/18/23 1411  BP: (!) 140/65 (!) 160/84 (!) 140/90 (!) 141/76  Pulse: 73 73 72 70  Resp: 17 16 16 16   Temp:  98 F (36.7 C) 98 F (36.7 C) 97.7  F (36.5 C)  TempSrc:  Oral Oral Oral  SpO2: 93% 95% 98% 96%  Weight:      Height:       General exam: Appears calm and comfortable  Respiratory system: Clear to auscultation. Respiratory effort normal. Cardiovascular system:  S1 & S2 heard, RRR. No JVD, murmurs, rubs, gallops or clicks. No pedal edema. Gastrointestinal system: Abdomen is nondistended, soft and nontender. No organomegaly or masses felt. Normal bowel sounds heard. Central nervous system: Alert and oriented. No focal neurological deficits. Extremities: Symmetric 5 x 5 power. Skin: No rashes, lesions or ulcers Psychiatry: Judgement and insight appear normal. Mood & affect appropriate.    Data Reviewed:  Lab results reviewed.  Family Communication: None  Disposition: Status is: Inpatient Remains inpatient appropriate because: Unsafe discharge option, pending nursing home placement.     Time spent: 35 minutes  Author: Marrion Coy, MD 07/18/2023 2:48 PM  For on call review [email or web address].

## 2023-07-19 DIAGNOSIS — N179 Acute kidney failure, unspecified: Secondary | ICD-10-CM | POA: Diagnosis not present

## 2023-07-19 DIAGNOSIS — E538 Deficiency of other specified B group vitamins: Secondary | ICD-10-CM | POA: Diagnosis not present

## 2023-07-19 DIAGNOSIS — E8729 Other acidosis: Secondary | ICD-10-CM | POA: Diagnosis not present

## 2023-07-19 LAB — CBC
HCT: 34.6 % — ABNORMAL LOW (ref 39.0–52.0)
Hemoglobin: 11.5 g/dL — ABNORMAL LOW (ref 13.0–17.0)
MCH: 31.3 pg (ref 26.0–34.0)
MCHC: 33.2 g/dL (ref 30.0–36.0)
MCV: 94.3 fL (ref 80.0–100.0)
Platelets: 139 10*3/uL — ABNORMAL LOW (ref 150–400)
RBC: 3.67 MIL/uL — ABNORMAL LOW (ref 4.22–5.81)
RDW: 15.9 % — ABNORMAL HIGH (ref 11.5–15.5)
WBC: 5.8 10*3/uL (ref 4.0–10.5)
nRBC: 0 % (ref 0.0–0.2)

## 2023-07-19 LAB — BASIC METABOLIC PANEL
Anion gap: 8 (ref 5–15)
BUN: 25 mg/dL — ABNORMAL HIGH (ref 8–23)
CO2: 31 mmol/L (ref 22–32)
Calcium: 9.1 mg/dL (ref 8.9–10.3)
Chloride: 98 mmol/L (ref 98–111)
Creatinine, Ser: 3.19 mg/dL — ABNORMAL HIGH (ref 0.61–1.24)
GFR, Estimated: 20 mL/min — ABNORMAL LOW (ref >60)
Glucose, Bld: 110 mg/dL — ABNORMAL HIGH (ref 70–99)
Potassium: 3.4 mmol/L — ABNORMAL LOW (ref 3.5–5.1)
Sodium: 137 mmol/L (ref 135–145)

## 2023-07-19 LAB — HEPATITIS B CORE ANTIBODY, TOTAL: HEP B CORE AB: POSITIVE — AB

## 2023-07-19 MED ORDER — HEPARIN SODIUM (PORCINE) 1000 UNIT/ML IJ SOLN
INTRAMUSCULAR | Status: AC
  Filled 2023-07-19: qty 10

## 2023-07-19 MED ORDER — STERILE WATER FOR INJECTION IJ SOLN
INTRAMUSCULAR | Status: AC
Start: 2023-07-19 — End: ?
  Filled 2023-07-19: qty 10

## 2023-07-19 MED ORDER — POTASSIUM CHLORIDE CRYS ER 20 MEQ PO TBCR
20.0000 meq | EXTENDED_RELEASE_TABLET | Freq: Once | ORAL | Status: AC
  Administered 2023-07-19: 20 meq via ORAL
  Filled 2023-07-19: qty 1

## 2023-07-19 MED ORDER — ACETAMINOPHEN 325 MG PO TABS
ORAL_TABLET | ORAL | Status: AC
  Filled 2023-07-19: qty 2

## 2023-07-19 MED ORDER — HEPARIN SODIUM (PORCINE) 1000 UNIT/ML DIALYSIS
1000.0000 [IU] | INTRAMUSCULAR | Status: DC | PRN
  Administered 2023-07-19: 2000 [IU]

## 2023-07-19 MED ORDER — ALTEPLASE 2 MG IJ SOLR
INTRAMUSCULAR | Status: AC
Start: 2023-07-19 — End: ?
  Filled 2023-07-19: qty 4

## 2023-07-19 MED ORDER — ALTEPLASE 2 MG IJ SOLR
2.0000 mg | Freq: Once | INTRAMUSCULAR | Status: AC | PRN
  Administered 2023-07-19: 2 mg

## 2023-07-19 NOTE — Progress Notes (Signed)
 Progress Note   Patient: [name] IV YNW:[id number] DOB: 1950-10-20 DOA: 07/09/2023     10 DOS: the patient was seen and examined on 07/19/2023   Brief hospital course: [name] IV is a 73 y.o. 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  Patient was found to have oliguric acute renal failure, renal function not improving after IV fluids, dialysis was started on 3/1.   Principal Problem:   AKI (acute kidney injury) (HCC) Active Problems:   High anion gap metabolic acidosis   Thrombocytopenia (HCC)   Anemia   Frequent falls   Complex regional pain syndrome of both lower extremities   Vitamin B12 deficiency   COPD (chronic obstructive pulmonary disease) (HCC)   Chronic respiratory failure with hypoxia (HCC)   Seizure disorder (HCC)   Essential hypertension   Tobacco use disorder   History of hepatitis C   Nasopharyngeal carcinoma (HCC)   Type 2 diabetes mellitus without complication, without long-term current use of insulin (HCC)   Dementia without behavioral disturbance (HCC)   Alcohol use disorder   Class 1 obesity   Assessment and Plan: AKI (acute kidney injury) (HCC) secondary to ATN, now needing dialysis High anion gap metabolic acidosis Hypokalemia. --unclear etiology.  Pos ibuprofen use, recent lisinopril.  Oliguric.   --nephrology consulted, started on dialysis on 3/1 Renal function stabilizing/improving. Discussed with nephrology for outpatient dialysis plan and permacath. Permacath placed on 3/7.  Currently pending nursing home placement.   Vitamin B12 anemia Thrombocytopenia Condition is still stable, started oral B12. CBC tomorrow.   Frequent falls Generalized  weakness Nursing home placement.   diabetic polyneuropathy Complex regional pain syndrome of both lower extremities --taper down home gabapentin due to kidney fxn decline   COPD (chronic obstructive pulmonary disease) (HCC) Chronic respiratory failure with hypoxia Not acutely exacerbated   Alcohol use disorder Thiamine folate and multivitamin, no alcohol withdrawal while in the hospital.   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  Followed by oncology and ENT   History of hepatitis C --hep C RNA pending, follow-up with infect disease after discharge.   Tobacco use disorder Nicotine patch   Essential hypertension Blood pressure varied --hold BP meds   Seizure disorder (HCC) Not on specific AEDs   Fever --100.8 early morning of 3/4 Respiratory panel was negative, no fever over the last 24 hours. Recent UA negative.  I obtained a chest x-ray, no evidence of pneumonia. Repeat blood culture negative for 2 days. No additional fever, antibiotics not needed.  No change of treatment plan today, Observation    Subjective:  Patient doing well today, no complaint.  Physical Exam: Vitals:   07/19/23 1100 07/19/23 1130 07/19/23 1200 07/19/23 1230  BP: 135/73 135/85 (!) 162/98 (!) 138/97  Pulse: 72 68 72 76  Resp: 16 14 13 16   Temp:      TempSrc:      SpO2:  95% 95% 97% 99%  Weight:      Height:       General exam: Appears calm and comfortable  Respiratory system: Clear to auscultation. Respiratory effort normal. Cardiovascular system: S1 & S2 heard, RRR. No JVD,  murmurs, rubs, gallops or clicks. No pedal edema. Gastrointestinal system: Abdomen is nondistended, soft and nontender. No organomegaly or masses felt. Normal bowel sounds heard. Central nervous system: Alert and oriented. No focal neurological deficits. Extremities: Symmetric 5 x 5 power. Skin: No rashes, lesions or ulcers Psychiatry: Judgement and insight appear normal. Mood & affect appropriate.    Data Reviewed:  Lab results reviewed.  Family Communication: none  Disposition: Status is: Inpatient Remains inpatient appropriate because: Unsafe discharge plan.     Time spent: 35 minutes  Author: Marrion Coy, MD 07/19/2023 1:02 PM  For on call review [email or web address].

## 2023-07-19 NOTE — Progress Notes (Signed)
 [facility]  ROUNDING NOTE   Subjective:   Patient seen and evaluated during dialysis   HEMODIALYSIS FLOWSHEET:  Blood Flow Rate (mL/min): 350 mL/min Arterial Pressure (mmHg): -154.74 mmHg Venous Pressure (mmHg): 155.75 mmHg TMP (mmHg): 10.7 mmHg Ultrafiltration Rate (mL/min): 568 mL/min Dialysate Flow Rate (mL/min): 299 ml/min  Tolerating treatment well  PermCath placed yesterday, required Cathflo dwell earlier in treatment due to multiple alarms   Objective:  Vital signs in last 24 hours:  Temp:  [97.7 F (36.5 C)-98.4 F (36.9 C)] 98 F (36.7 C) (03/08 0757) Pulse Rate:  [65-81] 76 (03/08 1230) Resp:  [11-19] 16 (03/08 1230) BP: (135-174)/(70-98) 138/97 (03/08 1230) SpO2:  [93 %-99 %] 99 % (03/08 1230) Weight:  [90.6 kg] 90.6 kg (03/08 0757)  Weight change: 0 kg Filed Weights   07/17/23 1229 07/18/23 0811 07/19/23 0757  Weight: 90.6 kg 90.6 kg 90.6 kg    Intake/Output: I/O last 3 completed shifts: In: 539.2 [P.O.:480; I.V.:12.5; IV Piggyback:46.7] Out: 150 [Urine:150]   Intake/Output this shift:  Total I/O In: -  Out: 250 [Urine:250]  Physical Exam: General: NAD  Head: Normocephalic, atraumatic. Moist oral mucosal membranes  Eyes: Anicteric  Lungs:  Clear to auscultation, normal effort  Heart: Regular rate and rhythm  Abdomen:  Soft, nontender, distention  Extremities: No peripheral edema.  Neurologic: Alert and awake, moving all four extremities  Skin: No lesions  Access:  Rt internal jugular permcath    Basic Metabolic Panel: Recent Labs  Lab 07/13/23 0722 07/14/23 0428 07/15/23 0411 07/16/23 0427 07/17/23 0419 07/18/23 0416 07/19/23 0408  NA 134* 136 136 136 138 137 137  K 4.0 4.0 3.3* 3.1* 3.3* 3.7 3.4*  CL 103 102 99 101 99 97* 98  CO2 19* 23 23 25 27 28 31   GLUCOSE 86 93 92 97 100* 111* 110*  BUN 40* 46* 33* 24* 32* 21 25*  CREATININE 8.04* 7.93* 6.15* 4.08* 4.40* 2.86* 3.19*  CALCIUM 8.2* 8.6* 8.6* 8.9 9.2 9.3 9.1   MG 1.9 2.1 1.9  --   --   --   --     Liver Function Tests: No results for input(s): "AST", "ALT", "ALKPHOS", "BILITOT", "PROT", "ALBUMIN" in the last 168 hours.  No results for input(s): "LIPASE", "AMYLASE" in the last 168 hours. No results for input(s): "AMMONIA" in the last 168 hours.  CBC: Recent Labs  Lab 07/14/23 0906 07/15/23 0853 07/17/23 0838 07/19/23 0811  WBC 3.1* 6.3 5.7 5.8  HGB 10.9* 10.8* 11.1* 11.5*  HCT 32.2* 31.3* 32.3* 34.6*  MCV 92.8 94.6 93.4 94.3  PLT 88* 96* 113* 139*    Cardiac Enzymes: No results for input(s): "CKTOTAL", "CKMB", "CKMBINDEX", "TROPONINI" in the last 168 hours.  BNP: Invalid input(s): "POCBNP"  CBG: Recent Labs  Lab 07/13/23 0807 07/13/23 1126 07/15/23 1447  GLUCAP 77 165* 94    Microbiology: Results for orders placed or performed during the hospital encounter of 07/09/23  Culture, blood (Routine X 2) w Reflex to ID Panel     Status: None (Preliminary result)   Collection Time: 07/15/23 10:02 PM   Specimen: BLOOD  Result Value Ref Range Status   Specimen Description BLOOD LEFT ARM  Final   Special Requests   Final    BOTTLES DRAWN AEROBIC AND ANAEROBIC Blood Culture adequate volume   Culture   Final    NO GROWTH 4 DAYS Performed at [facility], [address] [place], [place] [address]    Report Status PENDING  Incomplete  Culture, blood (Routine X 2) w Reflex to ID Panel     Status: None (Preliminary result)   Collection Time: 07/15/23 10:02 PM   Specimen: BLOOD  Result Value Ref Range Status   Specimen Description BLOOD RIGHT ARM  Final   Special Requests   Final    BOTTLES DRAWN AEROBIC AND ANAEROBIC Blood Culture adequate volume   Culture   Final    NO GROWTH 4 DAYS Performed at [facility], [address], [place], [place] [address]    Report Status PENDING  Incomplete  Resp panel by RT-PCR (RSV, Flu A&B, Covid) Anterior Nasal Swab     Status: None   Collection Time: 07/15/23 10:24  PM   Specimen: Anterior Nasal Swab  Result Value Ref Range Status   SARS Coronavirus 2 by RT PCR NEGATIVE NEGATIVE Final    Comment: (NOTE) SARS-CoV-2 target nucleic acids are NOT DETECTED.  The SARS-CoV-2 RNA is generally detectable in upper respiratory specimens during the acute phase of infection. The lowest concentration of SARS-CoV-2 viral copies this assay can detect is 138 copies/mL. A negative result does not preclude SARS-Cov-2 infection and should not be used as the sole basis for treatment or other patient management decisions. A negative result may occur with  improper specimen collection/handling, submission of specimen other than nasopharyngeal swab, presence of viral mutation(s) within the areas targeted by this assay, and inadequate number of viral copies(<138 copies/mL). A negative result must be combined with clinical observations, patient history, and epidemiological information. The expected result is Negative.  Fact Sheet for Patients:  BloggerCourse.com  Fact Sheet for Healthcare Providers:  SeriousBroker.it  This test is no t yet approved or cleared by the [place] FDA and  has been authorized for detection and/or diagnosis of SARS-CoV-2 by FDA under an Emergency Use Authorization (EUA). This EUA will remain  in effect (meaning this test can be used) for the duration of the COVID-19 declaration under Section 564(b)(1) of the Act, 21 U.S.C.section 360bbb-3(b)(1), unless the authorization is terminated  or revoked sooner.       Influenza A by PCR NEGATIVE NEGATIVE Final   Influenza B by PCR NEGATIVE NEGATIVE Final    Comment: (NOTE) The Xpert Xpress SARS-CoV-2/FLU/RSV plus assay is intended as an aid in the diagnosis of influenza from Nasopharyngeal swab specimens and should not be used as a sole basis for treatment. Nasal washings and aspirates are unacceptable for Xpert Xpress  SARS-CoV-2/FLU/RSV testing.  Fact Sheet for Patients: BloggerCourse.com  Fact Sheet for Healthcare Providers: SeriousBroker.it  This test is not yet approved or cleared by the [place] FDA and has been authorized for detection and/or diagnosis of SARS-CoV-2 by FDA under an Emergency Use Authorization (EUA). This EUA will remain in effect (meaning this test can be used) for the duration of the COVID-19 declaration under Section 564(b)(1) of the Act, 21 U.S.C. section 360bbb-3(b)(1), unless the authorization is terminated or revoked.     Resp Syncytial Virus by PCR NEGATIVE NEGATIVE Final    Comment: (NOTE) Fact Sheet for Patients: BloggerCourse.com  Fact Sheet for Healthcare Providers: SeriousBroker.it  This test is not yet approved or cleared by the [place] FDA and has been authorized for detection and/or diagnosis of SARS-CoV-2 by FDA under an Emergency Use Authorization (EUA). This EUA will remain in effect (meaning this test can be used) for the duration of the COVID-19 declaration under Section 564(b)(1) of the Act, 21 U.S.C. section 360bbb-3(b)(1), unless the authorization is  terminated or revoked.  Performed at [facility], [address] [place], [place] [address]     Coagulation Studies: No results for input(s): "LABPROT", "INR" in the last 72 hours.   Urinalysis: No results for input(s): "COLORURINE", "LABSPEC", "PHURINE", "GLUCOSEU", "HGBUR", "BILIRUBINUR", "KETONESUR", "PROTEINUR", "UROBILINOGEN", "NITRITE", "LEUKOCYTESUR" in the last 72 hours.  Invalid input(s): "APPERANCEUR"     Imaging: PERIPHERAL VASCULAR CATHETERIZATION Result Date: 07/18/2023 See surgical note for result.     Medications:      amitriptyline  75 mg Oral QHS   aspirin EC  81 mg Oral Daily   Chlorhexidine Gluconate Cloth  6 each Topical Q0600    Chlorhexidine Gluconate Cloth  6 each Topical Q0600   vitamin B-12  1,000 mcg Oral Daily   feeding supplement  237 mL Oral BID BM   folic acid  1 mg Oral Daily   heparin injection (subcutaneous)  5,000 Units Subcutaneous Q8H   multivitamin with minerals  1 tablet Oral Daily   nicotine  14 mg Transdermal Daily   potassium chloride  20 mEq Oral Once   QUEtiapine  50 mg Oral QHS   rosuvastatin  40 mg Oral Daily   thiamine  100 mg Oral Daily   acetaminophen **OR** acetaminophen, albuterol, haloperidol lactate, heparin, HYDROcodone-acetaminophen, ondansetron **OR** ondansetron (ZOFRAN) IV, mouth rinse  Assessment/ Plan:  [name] IV is a 73 y.o.  male with past medical conditions including alcohol use disorder, COPD with 2 L nasal cannula, diet-controlled diabetes, hepatitis C, seizure disorder, hypertension, and vitamin B12 deficiency, who was admitted to [facility] on 07/09/2023 for Thrombocytopenia (HCC) [D69.6] AKI (acute kidney injury) (HCC) [N17.9] Frequent falls [R29.6] Increased anion gap metabolic acidosis [E87.29] Injury of head, initial encounter [S09.90XA]   Acute kidney injury likely secondary to dehydration with NSAID use. Normal renal function noted 2 weeks prior. Renal ultrasound negative for hydronephrosis. No recent contrast exposure. Continue holding ibuprofen and lisinopril.  Autoimmune and complements within desired range. Kappa lambda ratio elevated, not uncommon with renal failure. Appreciate vascular placing permcath on 07/18/2023.  Patient receiving dialysis today, UF 0. Encourage nursing staff to monitor all urine output to assist in evaluation of renal recovery. - Next treatment scheduled for Tuesday  Renal navigator aware of patient, outpatient clinic search underway.    Lab Results  Component Value Date   CREATININE 3.19 (H) 07/19/2023   CREATININE 2.86 (H) 07/18/2023   CREATININE 4.40 (H) 07/17/2023    Intake/Output Summary (Last 24 hours) at  07/19/2023 1301 Last data filed at 07/19/2023 1039 Gross per 24 hour  Intake 0 ml  Output 250 ml  Net -250 ml    2.  Hypertension, essential.  Home regimen includes amlodipine and lisinopril.  Both currently held.   Blood pressure stable during dialysis   3. Diabetes mellitus type II, renal manifestations: noninsulin dependent. Most recent hemoglobin A1c is 6.1 on 06/25/23.    Diet controlled  4. Anemia with thrombocytopenia Lab Results  Component Value Date   HGB 11.5 (L) 07/19/2023    Will continue to monitor and consider ESA below 10.    LOS: 10 Caswell Alvillar 3/8/20251:01 PM

## 2023-07-19 NOTE — Progress Notes (Signed)
 PT Cancellation Note  Patient Details Name: [name] MRN: [id number] DOB: [birth year]   Cancelled Treatment:    Reason Eval/Treat Not Completed: Fatigue/lethargy limiting ability to participate Pt off unit majority of day in HD.  Offered PT to pt after HD and pt delined wanting to rest.  Acute PT will continue to follow and progress per current POC.    Hortencia Conradi, PTA  07/19/23, 3:03 PM

## 2023-07-19 NOTE — Plan of Care (Signed)
  Problem: Education: Goal: Ability to describe self-care measures that may prevent or decrease complications (Diabetes Survival Skills Education) will improve Outcome: Progressing Goal: Individualized Educational Video(s) Outcome: Progressing   Problem: Fluid Volume: Goal: Ability to maintain a balanced intake and output will improve Outcome: Progressing   Problem: Nutritional: Goal: Maintenance of adequate nutrition will improve Outcome: Progressing Goal: Progress toward achieving an optimal weight will improve Outcome: Progressing   Problem: Skin Integrity: Goal: Risk for impaired skin integrity will decrease Outcome: Progressing   Problem: Education: Goal: Knowledge of General Education information will improve Description: Including pain rating scale, medication(s)/side effects and non-pharmacologic comfort measures Outcome: Progressing

## 2023-07-19 NOTE — Progress Notes (Signed)
 Received patient in bed to unit.  Alert and oriented.  Informed consent signed and in chart.   TX duration: 3.5 hours  Patient tolerated well.  Transported back to the room  Alert, without acute distress.  Hand-off given to patient's nurse.   Access used: Dialysis Catheter Right IJ Access issues: Poor outflow from the arterial port. Alteplase dwell done. Dr. Thedore Mins and Trinna Balloon informed   Total UF removed: 0 Medication(s) given: Acetaminophen 650 mg Post HD VS: 140/78 Post HD weight: 89.8kg   Diahann Guajardo [facility]

## 2023-07-20 DIAGNOSIS — E8729 Other acidosis: Secondary | ICD-10-CM | POA: Diagnosis not present

## 2023-07-20 DIAGNOSIS — N179 Acute kidney failure, unspecified: Secondary | ICD-10-CM | POA: Diagnosis not present

## 2023-07-20 DIAGNOSIS — E538 Deficiency of other specified B group vitamins: Secondary | ICD-10-CM | POA: Diagnosis not present

## 2023-07-20 LAB — CBC
HCT: 33.3 % — ABNORMAL LOW (ref 39.0–52.0)
Hemoglobin: 11.5 g/dL — ABNORMAL LOW (ref 13.0–17.0)
MCH: 32.5 pg (ref 26.0–34.0)
MCHC: 34.5 g/dL (ref 30.0–36.0)
MCV: 94.1 fL (ref 80.0–100.0)
Platelets: 137 10*3/uL — ABNORMAL LOW (ref 150–400)
RBC: 3.54 MIL/uL — ABNORMAL LOW (ref 4.22–5.81)
RDW: 15.6 % — ABNORMAL HIGH (ref 11.5–15.5)
WBC: 5.8 10*3/uL (ref 4.0–10.5)
nRBC: 0 % (ref 0.0–0.2)

## 2023-07-20 LAB — BASIC METABOLIC PANEL
Anion gap: 8 (ref 5–15)
BUN: 18 mg/dL (ref 8–23)
CO2: 27 mmol/L (ref 22–32)
Calcium: 9.1 mg/dL (ref 8.9–10.3)
Chloride: 99 mmol/L (ref 98–111)
Creatinine, Ser: 2.56 mg/dL — ABNORMAL HIGH (ref 0.61–1.24)
GFR, Estimated: 26 mL/min — ABNORMAL LOW (ref >60)
Glucose, Bld: 113 mg/dL — ABNORMAL HIGH (ref 70–99)
Potassium: 3.8 mmol/L (ref 3.5–5.1)
Sodium: 134 mmol/L — ABNORMAL LOW (ref 135–145)

## 2023-07-20 LAB — CULTURE, BLOOD (ROUTINE X 2)
Culture: NO GROWTH
Culture: NO GROWTH
Special Requests: ADEQUATE
Special Requests: ADEQUATE

## 2023-07-20 NOTE — Plan of Care (Signed)

## 2023-07-20 NOTE — Plan of Care (Signed)

## 2023-07-20 NOTE — Progress Notes (Signed)
 [facility]  ROUNDING NOTE   Subjective:   Patient seen sitting at bedside Reports appropriate appetite Remains on room air with no lower extremity edema  Urine output 900 mL in preceding 24 hours   Objective:  Vital signs in last 24 hours:  Temp:  [97.8 F (36.6 C)-98.2 F (36.8 C)] 98.2 F (36.8 C) (03/09 1031) Pulse Rate:  [73-84] 78 (03/09 1035) Resp:  [12-20] 18 (03/09 0448) BP: (90-146)/(55-97) 114/58 (03/09 1035) SpO2:  [93 %-99 %] 93 % (03/09 1031) Weight:  [89.8 kg] 89.8 kg (03/08 1330)  Weight change: 0 kg Filed Weights   07/18/23 0811 07/19/23 0757 07/19/23 1330  Weight: 90.6 kg 90.6 kg 89.8 kg    Intake/Output: I/O last 3 completed shifts: In: 600 [P.O.:600] Out: 900 [Urine:900]   Intake/Output this shift:  Total I/O In: 480 [P.O.:480] Out: 200 [Urine:200]  Physical Exam: General: NAD  Head: Normocephalic, atraumatic. Moist oral mucosal membranes  Eyes: Anicteric  Lungs:  Clear to auscultation, normal effort  Heart: Regular rate and rhythm  Abdomen:  Soft, nontender, distention  Extremities: No peripheral edema.  Neurologic: Alert and awake, moving all four extremities  Skin: No lesions  Access:  Rt internal jugular permcath    Basic Metabolic Panel: Recent Labs  Lab 07/14/23 0428 07/15/23 0411 07/16/23 0427 07/17/23 0419 07/18/23 0416 07/19/23 0408 07/20/23 0614  NA 136 136 136 138 137 137 134*  K 4.0 3.3* 3.1* 3.3* 3.7 3.4* 3.8  CL 102 99 101 99 97* 98 99  CO2 23 23 25 27 28 31 27   GLUCOSE 93 92 97 100* 111* 110* 113*  BUN 46* 33* 24* 32* 21 25* 18  CREATININE 7.93* 6.15* 4.08* 4.40* 2.86* 3.19* 2.56*  CALCIUM 8.6* 8.6* 8.9 9.2 9.3 9.1 9.1  MG 2.1 1.9  --   --   --   --   --     Liver Function Tests: No results for input(s): "AST", "ALT", "ALKPHOS", "BILITOT", "PROT", "ALBUMIN" in the last 168 hours.  No results for input(s): "LIPASE", "AMYLASE" in the last 168 hours. No results for input(s): "AMMONIA" in the  last 168 hours.  CBC: Recent Labs  Lab 07/14/23 0906 07/15/23 0853 07/17/23 0838 07/19/23 0811 07/20/23 0614  WBC 3.1* 6.3 5.7 5.8 5.8  HGB 10.9* 10.8* 11.1* 11.5* 11.5*  HCT 32.2* 31.3* 32.3* 34.6* 33.3*  MCV 92.8 94.6 93.4 94.3 94.1  PLT 88* 96* 113* 139* 137*    Cardiac Enzymes: No results for input(s): "CKTOTAL", "CKMB", "CKMBINDEX", "TROPONINI" in the last 168 hours.  BNP: Invalid input(s): "POCBNP"  CBG: Recent Labs  Lab 07/13/23 1126 07/15/23 1447  GLUCAP 165* 94    Microbiology: Results for orders placed or performed during the hospital encounter of 07/09/23  Culture, blood (Routine X 2) w Reflex to ID Panel     Status: None   Collection Time: 07/15/23 10:02 PM   Specimen: BLOOD  Result Value Ref Range Status   Specimen Description BLOOD LEFT ARM  Final   Special Requests   Final    BOTTLES DRAWN AEROBIC AND ANAEROBIC Blood Culture adequate volume   Culture   Final    NO GROWTH 5 DAYS Performed at [facility], [address], [place], [place] [address]    Report Status 07/20/2023 FINAL  Final  Culture, blood (Routine X 2) w Reflex to ID Panel     Status: None   Collection Time: 07/15/23 10:02 PM   Specimen: BLOOD  Result  Value Ref Range Status   Specimen Description BLOOD RIGHT ARM  Final   Special Requests   Final    BOTTLES DRAWN AEROBIC AND ANAEROBIC Blood Culture adequate volume   Culture   Final    NO GROWTH 5 DAYS Performed at [facility] [address] [place], [place] [address]    Report Status 07/20/2023 FINAL  Final  Resp panel by RT-PCR (RSV, Flu A&B, Covid) Anterior Nasal Swab     Status: None   Collection Time: 07/15/23 10:24 PM   Specimen: Anterior Nasal Swab  Result Value Ref Range Status   SARS Coronavirus 2 by RT PCR NEGATIVE NEGATIVE Final    Comment: (NOTE) SARS-CoV-2 target nucleic acids are NOT DETECTED.  The SARS-CoV-2 RNA is generally detectable in upper respiratory specimens during the acute  phase of infection. The lowest concentration of SARS-CoV-2 viral copies this assay can detect is 138 copies/mL. A negative result does not preclude SARS-Cov-2 infection and should not be used as the sole basis for treatment or other patient management decisions. A negative result may occur with  improper specimen collection/handling, submission of specimen other than nasopharyngeal swab, presence of viral mutation(s) within the areas targeted by this assay, and inadequate number of viral copies(<138 copies/mL). A negative result must be combined with clinical observations, patient history, and epidemiological information. The expected result is Negative.  Fact Sheet for Patients:  BloggerCourse.com  Fact Sheet for Healthcare Providers:  SeriousBroker.it  This test is no t yet approved or cleared by the [place] FDA and  has been authorized for detection and/or diagnosis of SARS-CoV-2 by FDA under an Emergency Use Authorization (EUA). This EUA will remain  in effect (meaning this test can be used) for the duration of the COVID-19 declaration under Section 564(b)(1) of the Act, 21 U.S.C.section 360bbb-3(b)(1), unless the authorization is terminated  or revoked sooner.       Influenza A by PCR NEGATIVE NEGATIVE Final   Influenza B by PCR NEGATIVE NEGATIVE Final    Comment: (NOTE) The Xpert Xpress SARS-CoV-2/FLU/RSV plus assay is intended as an aid in the diagnosis of influenza from Nasopharyngeal swab specimens and should not be used as a sole basis for treatment. Nasal washings and aspirates are unacceptable for Xpert Xpress SARS-CoV-2/FLU/RSV testing.  Fact Sheet for Patients: BloggerCourse.com  Fact Sheet for Healthcare Providers: SeriousBroker.it  This test is not yet approved or cleared by the [place] FDA and has been authorized for detection and/or diagnosis of  SARS-CoV-2 by FDA under an Emergency Use Authorization (EUA). This EUA will remain in effect (meaning this test can be used) for the duration of the COVID-19 declaration under Section 564(b)(1) of the Act, 21 U.S.C. section 360bbb-3(b)(1), unless the authorization is terminated or revoked.     Resp Syncytial Virus by PCR NEGATIVE NEGATIVE Final    Comment: (NOTE) Fact Sheet for Patients: BloggerCourse.com  Fact Sheet for Healthcare Providers: SeriousBroker.it  This test is not yet approved or cleared by the [place] FDA and has been authorized for detection and/or diagnosis of SARS-CoV-2 by FDA under an Emergency Use Authorization (EUA). This EUA will remain in effect (meaning this test can be used) for the duration of the COVID-19 declaration under Section 564(b)(1) of the Act, 21 U.S.C. section 360bbb-3(b)(1), unless the authorization is terminated or revoked.  Performed at [facility], [address] [place], [place] [address]     Coagulation Studies: No results for input(s): "LABPROT", "INR" in the last 72  hours.   Urinalysis: No results for input(s): "COLORURINE", "LABSPEC", "PHURINE", "GLUCOSEU", "HGBUR", "BILIRUBINUR", "KETONESUR", "PROTEINUR", "UROBILINOGEN", "NITRITE", "LEUKOCYTESUR" in the last 72 hours.  Invalid input(s): "APPERANCEUR"     Imaging: No results found.     Medications:      amitriptyline  75 mg Oral QHS   aspirin EC  81 mg Oral Daily   Chlorhexidine Gluconate Cloth  6 each Topical Q0600   Chlorhexidine Gluconate Cloth  6 each Topical Q0600   vitamin B-12  1,000 mcg Oral Daily   feeding supplement  237 mL Oral BID BM   folic acid  1 mg Oral Daily   heparin injection (subcutaneous)  5,000 Units Subcutaneous Q8H   multivitamin with minerals  1 tablet Oral Daily   nicotine  14 mg Transdermal Daily   QUEtiapine  50 mg Oral QHS   rosuvastatin  40 mg Oral Daily   thiamine   100 mg Oral Daily   acetaminophen **OR** acetaminophen, albuterol, haloperidol lactate, HYDROcodone-acetaminophen, ondansetron **OR** ondansetron (ZOFRAN) West, mouth rinse  Assessment/ Plan:  [name] is a 73 y.o.  male with past medical conditions including alcohol use disorder, COPD with 2 L nasal cannula, diet-controlled diabetes, hepatitis C, seizure disorder, hypertension, and vitamin B12 deficiency, who was admitted to [facility] on 07/09/2023 for Thrombocytopenia (HCC) [D69.6] AKI (acute kidney injury) (HCC) [N17.9] Frequent falls [R29.6] Increased anion gap metabolic acidosis [E87.29] Injury of head, initial encounter [S09.90XA]   Acute kidney injury likely secondary to dehydration with NSAID use. Normal renal function noted 2 weeks prior. Renal ultrasound negative for hydronephrosis. No recent contrast exposure. Continue holding ibuprofen and lisinopril.  Autoimmune and complements within desired range. Kappa lambda ratio elevated, not uncommon with renal failure. Appreciate vascular placing permcath on 07/18/2023.  Encourage nursing staff to monitor all urine output to assist in evaluation of renal recovery. -Dialysis received yesterday, UF 0. - Next treatment scheduled for Tuesday  Renal navigator aware of patient, outpatient clinic search underway.    Lab Results  Component Value Date   CREATININE 2.56 (H) 07/20/2023   CREATININE 3.19 (H) 07/19/2023   CREATININE 2.86 (H) 07/18/2023    Intake/Output Summary (Last 24 hours) at 07/20/2023 1221 Last data filed at 07/20/2023 0915 Gross per 24 hour  Intake 1080 ml  Output 850 ml  Net 230 ml    2.  Hypertension, essential.  Home regimen includes amlodipine and lisinopril.  Both currently held.   Blood pressure stable   3. Diabetes mellitus type II, renal manifestations: noninsulin dependent. Most recent hemoglobin A1c is 6.1 on 06/25/23.    Diet controlled  4. Anemia with thrombocytopenia Lab Results  Component  Value Date   HGB 11.5 (L) 07/20/2023    Will continue to monitor and consider ESA below 10.  Hemoglobin remains acceptable   LOS: 11 [name] 3/9/202512:21 PM

## 2023-07-20 NOTE — Progress Notes (Signed)
 Progress Note   Patient: [name] ZOX:[id number] DOB: 1951/04/15 DOA: 07/09/2023     11 DOS: the patient was seen and examined on 07/20/2023   Brief hospital course: [name] is a 73 y.o. 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  Patient was found to have oliguric acute renal failure, renal function not improving after West fluids, dialysis was started on 3/1.   Principal Problem:   AKI (acute kidney injury) (HCC) Active Problems:   High anion gap metabolic acidosis   Thrombocytopenia (HCC)   Anemia   Frequent falls   Complex regional pain syndrome of both lower extremities   Vitamin B12 deficiency   COPD (chronic obstructive pulmonary disease) (HCC)   Chronic respiratory failure with hypoxia (HCC)   Seizure disorder (HCC)   Essential hypertension   Tobacco use disorder   History of hepatitis C   Nasopharyngeal carcinoma (HCC)   Type 2 diabetes mellitus without complication, without long-term current use of insulin (HCC)   Dementia without behavioral disturbance (HCC)   Alcohol use disorder   Class 1 obesity   Assessment and Plan: AKI (acute kidney injury) (HCC) secondary to ATN, now needing dialysis High anion gap metabolic acidosis Hypokalemia. Hyponatremia. --unclear etiology.  Pos ibuprofen use, recent lisinopril.  Oliguric.   --nephrology consulted, started on dialysis on 3/1 Renal function stabilizing/improving. Discussed with nephrology for outpatient dialysis plan and permacath. Permacath placed on 3/7.  Currently pending nursing home placement. Patient renal function appears to be improving, followed by nephrology for continued dialysis.   Vitamin B12  anemia Thrombocytopenia Condition is still stable, started oral B12. Condition is stable.   Frequent falls Generalized weakness Nursing home placement.   diabetic polyneuropathy Complex regional pain syndrome of both lower extremities --taper down home gabapentin due to kidney fxn decline   COPD (chronic obstructive pulmonary disease) (HCC) Chronic respiratory failure with hypoxia Not acutely exacerbated   Alcohol use disorder Thiamine folate and multivitamin, no alcohol withdrawal while in the hospital.   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  Followed by oncology and ENT   History of hepatitis C --hep C RNA pending, follow-up with infect disease after discharge.   Tobacco use disorder Nicotine patch   Essential hypertension Blood pressure varied --hold BP meds   Seizure disorder (HCC) Not on specific AEDs   Fever --100.8 early morning of 3/4 Respiratory panel was negative, no fever over the last 24 hours. Recent UA negative.  I obtained a chest x-ray, no evidence of pneumonia. Repeat blood culture negative for 2 days. No additional fever, antibiotics not needed.        Subjective:  He has no complaint today.  No shortness of breath or cough.  Physical Exam: Vitals:   07/19/23 1448 07/19/23 2141 07/20/23 0448 07/20/23 1031  BP: 117/72 104/65 117/67 (!) 90/55  Pulse: 80 75 77 78  Resp: 18 20  18   Temp: 98.1 F (36.7 C) 98 F (36.7 C) 97.8 F (36.6 C) 98.2 F (36.8 C)  TempSrc: Oral   Oral  SpO2: 97% 95% 95% 93%  Weight:      Height:        General exam: Appears calm and comfortable  Respiratory system: Clear to auscultation. Respiratory effort normal. Cardiovascular system: S1 & S2 heard, RRR. No JVD, murmurs, rubs, gallops or clicks. No pedal edema. Gastrointestinal system: Abdomen is nondistended, soft and nontender. No organomegaly or masses felt. Normal bowel sounds heard. Central nervous system: Alert and oriented. No focal neurological deficits. Extremities: Symmetric 5 x 5 power. Skin: No rashes, lesions or ulcers Psychiatry: Judgement and insight appear normal. Mood & affect appropriate.    Data Reviewed:  Lab results reviewed.  Family Communication: None  Disposition: Status is: Inpatient Remains inpatient appropriate because: Unsafe discharge.     Time spent: 35 minutes  Author: Marrion Coy, MD 07/20/2023 10:39 AM  For on call review [email or web address].

## 2023-07-21 DIAGNOSIS — N179 Acute kidney failure, unspecified: Secondary | ICD-10-CM | POA: Diagnosis not present

## 2023-07-21 DIAGNOSIS — J9611 Chronic respiratory failure with hypoxia: Secondary | ICD-10-CM | POA: Diagnosis not present

## 2023-07-21 DIAGNOSIS — E8729 Other acidosis: Secondary | ICD-10-CM | POA: Diagnosis not present

## 2023-07-21 LAB — RENAL FUNCTION PANEL
Albumin: 3.7 g/dL (ref 3.5–5.0)
Anion gap: 11 (ref 5–15)
BUN: 30 mg/dL — ABNORMAL HIGH (ref 8–23)
CO2: 28 mmol/L (ref 22–32)
Calcium: 9.5 mg/dL (ref 8.9–10.3)
Chloride: 96 mmol/L — ABNORMAL LOW (ref 98–111)
Creatinine, Ser: 3.11 mg/dL — ABNORMAL HIGH (ref 0.61–1.24)
GFR, Estimated: 21 mL/min — ABNORMAL LOW (ref >60)
Glucose, Bld: 115 mg/dL — ABNORMAL HIGH (ref 70–99)
Phosphorus: 3.4 mg/dL (ref 2.5–4.6)
Potassium: 3.7 mmol/L (ref 3.5–5.1)
Sodium: 135 mmol/L (ref 135–145)

## 2023-07-21 NOTE — Evaluation (Signed)
 Occupational Therapy Evaluation Patient Details Name: [name] MRN: [id number] DOB: 11/09/1950 Today's Date: 07/21/2023   History of Present Illness   73 y.o. 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  He feels like his knees just gives out and he falls.  He hit his head on 1 occasion but did not lose consciousness.    Clinical Impressions [name] was seen for OT evaluation this date. Prior to hospital admission, pt was MOD I using 4WW. Pt lives alone. Pt currently requires SUPERVISION for toilet t/f and standing grooming tasks, improves to MOD I with use of RW. MOD I don/doff B shoes in sitting. Pt would benefit from skilled OT to address noted impairments and functional limitations (see below for any additional details). Upon hospital discharge, recommend OT follow up and use of AD at all times on d/c.     If plan is discharge home, recommend the following:   A little help with walking and/or transfers;A little help with bathing/dressing/bathroom     Functional Status Assessment   Patient has had a recent decline in their functional status and demonstrates the ability to make significant improvements in function in a reasonable and predictable amount of time.     Equipment Recommendations   None recommended by OT     Recommendations for Other Services         Precautions/Restrictions   Precautions Precautions: Fall Recall of Precautions/Restrictions: Intact Restrictions Weight Bearing Restrictions Per Provider Order: No     Mobility Bed Mobility               General bed mobility comments: not tested    Transfers Overall transfer level: Needs assistance Equipment  used: None Transfers: Sit to/from Stand Sit to Stand: Supervision                  Balance Overall balance assessment: Needs assistance Sitting-balance support: No upper extremity supported Sitting balance-Leahy Scale: Normal     Standing balance support: No upper extremity supported, During functional activity Standing balance-Leahy Scale: Good Standing balance comment: stagger steps without AD during mobility                           ADL either performed or assessed with clinical judgement   ADL Overall ADL's : Needs assistance/impaired                                       General ADL Comments: SUPERVISION for toilet t/f and standing grooming tasks, improves to MOD I with use of RW. MOD I don/doff B shoes in sitting      Pertinent Vitals/Pain Pain Assessment Pain Assessment: No/denies pain     Extremity/Trunk Assessment Upper Extremity Assessment Upper Extremity Assessment: Overall WFL for tasks assessed   Lower Extremity Assessment Lower Extremity Assessment: Overall WFL for tasks assessed       Communication Communication Communication: No apparent difficulties   Cognition Arousal: Alert Behavior During Therapy: WFL for tasks assessed/performed Cognition: No apparent impairments  Following commands: Intact                  Home Living Family/patient expects to be discharged to:: Private residence Living Arrangements: Alone Available Help at Discharge: Friend(s);Available PRN/intermittently Type of Home: Apartment Home Access: Elevator     Home Layout: One level     Bathroom Shower/Tub: Tub/shower unit         Home Equipment: Rollator (4 wheels);Gilmer Mor - single point   Additional Comments: lives at [facility] senior apartments      Prior Functioning/Environment Prior Level of Function : Independent/Modified Independent;History of Falls (last six months)              Mobility Comments: drives, takes the stairs for exercise; endorses many falls when not using 4WW      OT Problem List: Decreased strength;Decreased activity tolerance;Impaired balance (sitting and/or standing)   OT Treatment/Interventions: Self-care/ADL training;Therapeutic exercise;Energy conservation;DME and/or AE instruction      OT Goals(Current goals can be found in the care plan section)   Acute Rehab OT Goals Patient Stated Goal: to go home OT Goal Formulation: With patient Time For Goal Achievement: 08/04/23 Potential to Achieve Goals: Good ADL Goals Pt Will Transfer to Toilet: Independently;ambulating;regular height toilet Additional ADL Goal #1: Pt will verbalize plan to implement x3 falls prevention strategies.   OT Frequency:  Min 1X/week    Co-evaluation              AM-PAC OT "6 Clicks" Daily Activity     Outcome Measure Help from another person eating meals?: None Help from another person taking care of personal grooming?: None Help from another person toileting, which includes using toliet, bedpan, or urinal?: A Little Help from another person bathing (including washing, rinsing, drying)?: A Little Help from another person to put on and taking off regular upper body clothing?: None Help from another person to put on and taking off regular lower body clothing?: None 6 Click Score: 22   End of Session Equipment Utilized During Treatment: Gait belt;Rolling walker (2 wheels)  Activity Tolerance: Patient tolerated treatment well Patient left: in bed;with call bell/phone within reach;with bed alarm set  OT Visit Diagnosis: Other abnormalities of gait and mobility (R26.89);Muscle weakness (generalized) (M62.81)                Time: 1610-9604 OT Time Calculation (min): 9 min Charges:  OT General Charges $OT Visit: 1 Visit OT Evaluation $OT Eval Low Complexity: 1 Low  Kathie Dike, M.S. OTR/L  07/21/23, 9:51 AM  ascom [phone]

## 2023-07-21 NOTE — Progress Notes (Signed)
 Mobility Specialist - Progress Note   07/21/23 1500  Mobility  Activity Ambulated with assistance in room;Ambulated with assistance to bathroom  Level of Assistance Contact guard assist, steadying assist  Assistive Device None  Distance Ambulated (ft) 20 ft  Activity Response Tolerated well  Mobility visit 1 Mobility     Author responded to bed alarm. Pt dangling EOB on arrival, requesting assistance to bathroom. STS and ambulation with CGA. 1 mild LOB, corrected with CGA. Pt returned to bed with alarm set, needs in reach.   Filiberto Pinks Mobility Specialist 07/21/23, 3:58 PM

## 2023-07-21 NOTE — TOC Progression Note (Signed)
 Transition of Care [facility]) - Progression Note    Patient Details  Name: [name] MRN: [id number] Date of Birth: 1950/08/26  [facility] CM/SW Contact  Chapman Fitch, RN Phone Number: 07/21/2023, 11:35 AM  Clinical Narrative:     Patient now A&O x4  Therapy has upgrade recs to home health.  Met with patient.  He is in agreement and states he does not have a preference of agency. Referral made to [place] with [facility]patient HD center pending Patient states that he will be driving himself to HD    Patient states that his friend Mac will be providing transport  Expected Discharge Plan and Services                                               Social Determinants of Health (SDOH) Interventions SDOH Screenings   Food Insecurity: No Food Insecurity (07/10/2023)  Housing: Low Risk  (07/10/2023)  Transportation Needs: No Transportation Needs (07/10/2023)  Utilities: Not At Risk (07/10/2023)  Alcohol Screen: Low Risk  (10/28/2022)  Depression (PHQ2-9): Low Risk  (06/25/2023)  Financial Resource Strain: Low Risk  (10/22/2022)  Physical Activity: Inactive (10/22/2022)  Social Connections: Socially Isolated (07/10/2023)  Stress: Stress Concern Present (01/29/2023)  Tobacco Use: Medium Risk (07/18/2023)    Readmission Risk Interventions     No data to display

## 2023-07-21 NOTE — Progress Notes (Signed)
 Progress Note   Patient: [name] JXB:[id number] DOB: December 26, 1950 DOA: 07/09/2023     12 DOS: the patient was seen and examined on 07/21/2023   Brief hospital course: [name] is a 73 y.o. 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  Patient was found to have oliguric acute renal failure, renal function not improving after West fluids, dialysis was started on 3/1.   Principal Problem:   AKI (acute kidney injury) (HCC) Active Problems:   High anion gap metabolic acidosis   Thrombocytopenia (HCC)   Anemia   Frequent falls   Complex regional pain syndrome of both lower extremities   Vitamin B12 deficiency   COPD (chronic obstructive pulmonary disease) (HCC)   Chronic respiratory failure with hypoxia (HCC)   Seizure disorder (HCC)   Essential hypertension   Tobacco use disorder   History of hepatitis C   Nasopharyngeal carcinoma (HCC)   Type 2 diabetes mellitus without complication, without long-term current use of insulin (HCC)   Dementia without behavioral disturbance (HCC)   Alcohol use disorder   Class 1 obesity   Assessment and Plan: AKI (acute kidney injury) (HCC) secondary to ATN, now needing dialysis High anion gap metabolic acidosis Hypokalemia. Hyponatremia. --unclear etiology.  Pos ibuprofen use, recent lisinopril.  Oliguric.   --nephrology consulted, started on dialysis on 3/1 Renal function stabilizing/improving. Discussed with nephrology for outpatient dialysis plan and permacath. Permacath placed on 3/7.  Currently pending nursing home placement. Patient renal function appears to be improving, followed by nephrology for continued dialysis.   Vitamin B12  anemia Thrombocytopenia Condition is still stable, started oral B12. Condition is stable.   Frequent falls Generalized weakness Nursing home placement.   diabetic polyneuropathy Complex regional pain syndrome of both lower extremities --taper down home gabapentin due to kidney fxn decline   COPD (chronic obstructive pulmonary disease) (HCC) Chronic respiratory failure with hypoxia Not acutely exacerbated   Alcohol use disorder Thiamine folate and multivitamin, no alcohol withdrawal while in the hospital.   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  Followed by oncology and ENT   History of hepatitis C --hep C RNA pending, follow-up with infect disease after discharge.   Tobacco use disorder Nicotine patch   Essential hypertension Blood pressure varied --hold BP meds   Seizure disorder (HCC) Not on specific AEDs   Fever --100.8 early morning of 3/4 Respiratory panel was negative, no fever over the last 24 hours. Recent UA negative.  I obtained a chest x-ray, no evidence of pneumonia. Repeat blood culture negative for 2 days. No additional fever, antibiotics not needed.    Patient condition had improved, seen by PT/OT, no longer recommended nursing home placement.  Discussed with TOC, working to set up outpatient dialysis.  Likely discharge today versus tomorrow.    Subjective:  Patient had no complaint today.  Physical Exam: Vitals:   07/20/23 1843 07/20/23 2054  07/21/23 0355 07/21/23 0900  BP: 125/69 (!) 115/92 105/63 127/77  Pulse: 74 72 72 95  Resp:   18 18 18   Temp: 98.2 F (36.8 C) 97.8 F (36.6 C) 98.7 F (37.1 C) (!) 97.3 F (36.3 C)  TempSrc: Oral Oral Oral Oral  SpO2: 96% 97% 94% 98%  Weight:      Height:       General exam: Appears calm and comfortable  Respiratory system: Clear to auscultation. Respiratory effort normal. Cardiovascular system: S1 & S2 heard, RRR. No JVD, murmurs, rubs, gallops or clicks. No pedal edema. Gastrointestinal system: Abdomen is nondistended, soft and nontender. No organomegaly or masses felt. Normal bowel sounds heard. Central nervous system: Alert and oriented. No focal neurological deficits. Extremities: Symmetric 5 x 5 power. Skin: No rashes, lesions or ulcers Psychiatry: Judgement and insight appear normal. Mood & affect appropriate.    Data Reviewed:  There are no new results to review at this time.  Family Communication: None  Disposition: Status is: Inpatient Remains inpatient appropriate because: Unsafe discharge pending HD setup     Time spent: 35 minutes  Author: Marrion Coy, MD 07/21/2023 3:10 PM  For on call review [email or web address].

## 2023-07-21 NOTE — Discharge Planning (Signed)
 Working on [employer] [facility] placement at [facility].  Referral in process.  Dimas Chyle Dialysis Coordinator II  [facility] Cell: [phone] eFax: [phone] [email or web address]

## 2023-07-21 NOTE — Progress Notes (Signed)
 [facility]  ROUNDING NOTE   Subjective:   Patient resting in bed Denies pain Appetite has improved States he is urinating more.    Objective:  Vital signs in last 24 hours:  Temp:  [97.3 F (36.3 C)-98.7 F (37.1 C)] 97.3 F (36.3 C) (03/10 0900) Pulse Rate:  [72-95] 95 (03/10 0900) Resp:  [18] 18 (03/10 0900) BP: (105-127)/(63-92) 127/77 (03/10 0900) SpO2:  [94 %-98 %] 98 % (03/10 0900)  Weight change:  Filed Weights   07/18/23 0811 07/19/23 0757 07/19/23 1330  Weight: 90.6 kg 90.6 kg 89.8 kg    Intake/Output: I/O last 3 completed shifts: In: 1080 [P.O.:1080] Out: 850 [Urine:850]   Intake/Output this shift:  Total I/O In: -  Out: 300 [Urine:300]  Physical Exam: General: NAD  Head: Normocephalic, atraumatic. Moist oral mucosal membranes  Eyes: Anicteric  Lungs:  Clear to auscultation, normal effort  Heart: Regular rate and rhythm  Abdomen:  Soft, nontender, distention  Extremities: No peripheral edema.  Neurologic: Alert and awake, moving all four extremities  Skin: No lesions  Access:  Rt internal jugular permcath    Basic Metabolic Panel: Recent Labs  Lab 07/15/23 0411 07/16/23 0427 07/17/23 0419 07/18/23 0416 07/19/23 0408 07/20/23 0614 07/21/23 0909  NA 136   < > 138 137 137 134* 135  K 3.3*   < > 3.3* 3.7 3.4* 3.8 3.7  CL 99   < > 99 97* 98 99 96*  CO2 23   < > 27 28 31 27 28   GLUCOSE 92   < > 100* 111* 110* 113* 115*  BUN 33*   < > 32* 21 25* 18 30*  CREATININE 6.15*   < > 4.40* 2.86* 3.19* 2.56* 3.11*  CALCIUM 8.6*   < > 9.2 9.3 9.1 9.1 9.5  MG 1.9  --   --   --   --   --   --   PHOS  --   --   --   --   --   --  3.4   < > = values in this interval not displayed.    Liver Function Tests: Recent Labs  Lab 07/21/23 0909  ALBUMIN 3.7    No results for input(s): "LIPASE", "AMYLASE" in the last 168 hours. No results for input(s): "AMMONIA" in the last 168 hours.  CBC: Recent Labs  Lab 07/15/23 0853 07/17/23 0838  07/19/23 0811 07/20/23 0614  WBC 6.3 5.7 5.8 5.8  HGB 10.8* 11.1* 11.5* 11.5*  HCT 31.3* 32.3* 34.6* 33.3*  MCV 94.6 93.4 94.3 94.1  PLT 96* 113* 139* 137*    Cardiac Enzymes: No results for input(s): "CKTOTAL", "CKMB", "CKMBINDEX", "TROPONINI" in the last 168 hours.  BNP: Invalid input(s): "POCBNP"  CBG: Recent Labs  Lab 07/15/23 1447  GLUCAP 94    Microbiology: Results for orders placed or performed during the hospital encounter of 07/09/23  Culture, blood (Routine X 2) w Reflex to ID Panel     Status: None   Collection Time: 07/15/23 10:02 PM   Specimen: BLOOD  Result Value Ref Range Status   Specimen Description BLOOD LEFT ARM  Final   Special Requests   Final    BOTTLES DRAWN AEROBIC AND ANAEROBIC Blood Culture adequate volume   Culture   Final    NO GROWTH 5 DAYS Performed at [facility], [address], [place], [place] [address]    Report Status 07/20/2023 FINAL  Final  Culture, blood (Routine X 2)  w Reflex to ID Panel     Status: None   Collection Time: 07/15/23 10:02 PM   Specimen: BLOOD  Result Value Ref Range Status   Specimen Description BLOOD RIGHT ARM  Final   Special Requests   Final    BOTTLES DRAWN AEROBIC AND ANAEROBIC Blood Culture adequate volume   Culture   Final    NO GROWTH 5 DAYS Performed at [facility], [address] [place], [place] [address]    Report Status 07/20/2023 FINAL  Final  Resp panel by RT-PCR (RSV, Flu A&B, Covid) Anterior Nasal Swab     Status: None   Collection Time: 07/15/23 10:24 PM   Specimen: Anterior Nasal Swab  Result Value Ref Range Status   SARS Coronavirus 2 by RT PCR NEGATIVE NEGATIVE Final    Comment: (NOTE) SARS-CoV-2 target nucleic acids are NOT DETECTED.  The SARS-CoV-2 RNA is generally detectable in upper respiratory specimens during the acute phase of infection. The lowest concentration of SARS-CoV-2 viral copies this assay can detect is 138 copies/mL. A negative result does  not preclude SARS-Cov-2 infection and should not be used as the sole basis for treatment or other patient management decisions. A negative result may occur with  improper specimen collection/handling, submission of specimen other than nasopharyngeal swab, presence of viral mutation(s) within the areas targeted by this assay, and inadequate number of viral copies(<138 copies/mL). A negative result must be combined with clinical observations, patient history, and epidemiological information. The expected result is Negative.  Fact Sheet for Patients:  BloggerCourse.com  Fact Sheet for Healthcare Providers:  SeriousBroker.it  This test is no t yet approved or cleared by the [place] FDA and  has been authorized for detection and/or diagnosis of SARS-CoV-2 by FDA under an Emergency Use Authorization (EUA). This EUA will remain  in effect (meaning this test can be used) for the duration of the COVID-19 declaration under Section 564(b)(1) of the Act, 21 U.S.C.section 360bbb-3(b)(1), unless the authorization is terminated  or revoked sooner.       Influenza A by PCR NEGATIVE NEGATIVE Final   Influenza B by PCR NEGATIVE NEGATIVE Final    Comment: (NOTE) The Xpert Xpress SARS-CoV-2/FLU/RSV plus assay is intended as an aid in the diagnosis of influenza from Nasopharyngeal swab specimens and should not be used as a sole basis for treatment. Nasal washings and aspirates are unacceptable for Xpert Xpress SARS-CoV-2/FLU/RSV testing.  Fact Sheet for Patients: BloggerCourse.com  Fact Sheet for Healthcare Providers: SeriousBroker.it  This test is not yet approved or cleared by the [place] FDA and has been authorized for detection and/or diagnosis of SARS-CoV-2 by FDA under an Emergency Use Authorization (EUA). This EUA will remain in effect (meaning this test can be used) for the  duration of the COVID-19 declaration under Section 564(b)(1) of the Act, 21 U.S.C. section 360bbb-3(b)(1), unless the authorization is terminated or revoked.     Resp Syncytial Virus by PCR NEGATIVE NEGATIVE Final    Comment: (NOTE) Fact Sheet for Patients: BloggerCourse.com  Fact Sheet for Healthcare Providers: SeriousBroker.it  This test is not yet approved or cleared by the [place] FDA and has been authorized for detection and/or diagnosis of SARS-CoV-2 by FDA under an Emergency Use Authorization (EUA). This EUA will remain in effect (meaning this test can be used) for the duration of the COVID-19 declaration under Section 564(b)(1) of the Act, 21 U.S.C. section 360bbb-3(b)(1), unless the authorization is terminated or revoked.  Performed at [facility], [address] [place], [place] [address]     Coagulation Studies: No results for input(s): "LABPROT", "INR" in the last 72 hours.   Urinalysis: No results for input(s): "COLORURINE", "LABSPEC", "PHURINE", "GLUCOSEU", "HGBUR", "BILIRUBINUR", "KETONESUR", "PROTEINUR", "UROBILINOGEN", "NITRITE", "LEUKOCYTESUR" in the last 72 hours.  Invalid input(s): "APPERANCEUR"     Imaging: No results found.     Medications:      amitriptyline  75 mg Oral QHS   aspirin EC  81 mg Oral Daily   Chlorhexidine Gluconate Cloth  6 each Topical Q0600   Chlorhexidine Gluconate Cloth  6 each Topical Q0600   vitamin B-12  1,000 mcg Oral Daily   feeding supplement  237 mL Oral BID BM   folic acid  1 mg Oral Daily   heparin injection (subcutaneous)  5,000 Units Subcutaneous Q8H   multivitamin with minerals  1 tablet Oral Daily   nicotine  14 mg Transdermal Daily   QUEtiapine  50 mg Oral QHS   rosuvastatin  40 mg Oral Daily   thiamine  100 mg Oral Daily   acetaminophen **OR** acetaminophen, albuterol, haloperidol lactate, HYDROcodone-acetaminophen, ondansetron  **OR** ondansetron (ZOFRAN) West, mouth rinse  Assessment/ Plan:  [name] is a 73 y.o.  male with past medical conditions including alcohol use disorder, COPD with 2 L nasal cannula, diet-controlled diabetes, hepatitis C, seizure disorder, hypertension, and vitamin B12 deficiency, who was admitted to [facility] on 07/09/2023 for Thrombocytopenia (HCC) [D69.6] AKI (acute kidney injury) (HCC) [N17.9] Frequent falls [R29.6] Increased anion gap metabolic acidosis [E87.29] Injury of head, initial encounter [S09.90XA]   Acute kidney injury likely secondary to dehydration with NSAID use. Normal renal function noted 2 weeks prior. Renal ultrasound negative for hydronephrosis. No recent contrast exposure. Continue holding ibuprofen and lisinopril.  Autoimmune and complements within desired range. Kappa lambda ratio elevated, not uncommon with renal failure. Appreciate vascular placing permcath on 07/18/2023.  -Will continue to monitor labs for renal recovery.  - Next treatment scheduled for Tuesday  Renal navigator aware of patient, outpatient clinic search in progress.    Lab Results  Component Value Date   CREATININE 3.11 (H) 07/21/2023   CREATININE 2.56 (H) 07/20/2023   CREATININE 3.19 (H) 07/19/2023    Intake/Output Summary (Last 24 hours) at 07/21/2023 1435 Last data filed at 07/21/2023 1222 Gross per 24 hour  Intake --  Output 300 ml  Net -300 ml    2.  Hypertension, essential.  Home regimen includes amlodipine and lisinopril.  Both currently held.   Blood pressure acceptable for this patient.    3. Diabetes mellitus type II, renal manifestations: noninsulin dependent. Most recent hemoglobin A1c is 6.1 on 06/25/23.    Diet controlled  4. Anemia with thrombocytopenia Lab Results  Component Value Date   HGB 11.5 (L) 07/20/2023    Will continue to monitor and consider ESA below 10.  Hemoglobin within desired range.   LOS: 12 [name] 3/10/20252:35 PM

## 2023-07-21 NOTE — Progress Notes (Signed)
 Physical Therapy Treatment Patient Details Name: [name] MRN: [id number] DOB: Dec 11, 1950 Today's Date: 07/21/2023   History of Present Illness 73 y.o. 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  He feels like his knees just gives out and he falls.  He hit his head on 1 occasion but did not lose consciousness.    PT Comments  Patient alert, oriented x4, denied pain. Able to transition to sitting EOB modI, and stood impulsively without AD, supervision. Able to ambulate to and from bathroom to void, don shoes in sitting, and use mouth wash all with supervision. Noted for some staggered steps. He was able to ambulate ~229ft with and without RW, but much improved steadiness with RW and pt stated he uses a rollator at home. Also able to perform 6 steps with supervision and use of rails. MD updated of pt progress in mobility. The patient would benefit from further skilled PT intervention to continue to progress towards goals.     If plan is discharge home, recommend the following: Assist for transportation;Help with stairs or ramp for entrance   Can travel by private vehicle     Yes  Equipment Recommendations  Rolling walker (2 wheels)    Recommendations for Other Services       Precautions / Restrictions Precautions Precautions: Fall Restrictions Weight Bearing Restrictions Per Provider Order: No     Mobility  Bed Mobility Overal bed mobility: Modified Independent                  Transfers Overall transfer level: Needs assistance Equipment used: Rolling walker (2 wheels), None Transfers: Sit to/from Stand Sit to Stand: Supervision                Ambulation/Gait Ambulation/Gait  assistance: Contact guard assist, Supervision Gait Distance (Feet): 260 Feet Assistive device: Rolling walker (2 wheels), None         General Gait Details: improved safety and balance with RW, pt stated he has a rollator at home he uses   Stairs Stairs: Yes Stairs assistance: Supervision Stair Management: Alternating pattern, Two rails, Forwards Number of Stairs: 6 General stair comments: supervision for safety, pt self selected alternating pattern   Wheelchair Mobility     Tilt Bed    Modified Rankin (Stroke Patients Only)       Balance Overall balance assessment: Needs assistance Sitting-balance support: No upper extremity supported Sitting balance-Leahy Scale: Good Sitting balance - Comments: donned shoes   Standing balance support: Single extremity supported, During functional activity Standing balance-Leahy Scale: Good Standing balance comment: most safe with RW, able to ambulate without it, but gait path deviations and some staggering noted                            Communication    Cognition Arousal: Alert Behavior During Therapy: WFL for tasks assessed/performed   PT - Cognitive impairments: No apparent impairments                       PT - Cognition Comments: oriented x4 Following commands: Intact      Cueing    Exercises      General Comments  Pertinent Vitals/Pain Pain Assessment Pain Assessment: No/denies pain    Home Living                          Prior Function            PT Goals (current goals can now be found in the care plan section) Progress towards PT goals: Progressing toward goals    Frequency    Min 1X/week      PT Plan      Co-evaluation              AM-PAC PT "6 Clicks" Mobility   Outcome Measure  Help needed turning from your back to your side while in a flat bed without using bedrails?: None Help needed moving from lying on your back to sitting on the side  of a flat bed without using bedrails?: None Help needed moving to and from a bed to a chair (including a wheelchair)?: None Help needed standing up from a chair using your arms (e.g., wheelchair or bedside chair)?: None Help needed to walk in hospital room?: None Help needed climbing 3-5 steps with a railing? : A Little 6 Click Score: 23    End of Session Equipment Utilized During Treatment: Gait belt Activity Tolerance: Patient tolerated treatment well Patient left: Other (comment) (with OT, sitting on EOB) Nurse Communication: Mobility status PT Visit Diagnosis: Unsteadiness on feet (R26.81);Repeated falls (R29.6);Muscle weakness (generalized) (M62.81);Difficulty in walking, not elsewhere classified (R26.2)     Time: 6440-3474 PT Time Calculation (min) (ACUTE ONLY): 14 min  Charges:    $Therapeutic Activity: 8-22 mins PT General Charges $$ ACUTE PT VISIT: 1 Visit                     Olga Coaster PT, DPT 9:24 AM,07/21/23

## 2023-07-21 NOTE — TOC Progression Note (Signed)
 Transition of Care [facility]) - Progression Note    Patient Details  Name: [name] MRN: [id number] Date of Birth: 11-10-1950  Transition of Care [facility]) CM/SW Contact  Chapman Fitch, RN Phone Number: 07/21/2023, 9:03 AM  Clinical Narrative:          Message sent to MD and HD coordinator to request update out patient HD chair time as patient will require insurance auth for SNF  Expected Discharge Plan and Services                                               Social Determinants of Health (SDOH) Interventions SDOH Screenings   Food Insecurity: No Food Insecurity (07/10/2023)  Housing: Low Risk  (07/10/2023)  Transportation Needs: No Transportation Needs (07/10/2023)  Utilities: Not At Risk (07/10/2023)  Alcohol Screen: Low Risk  (10/28/2022)  Depression (PHQ2-9): Low Risk  (06/25/2023)  Financial Resource Strain: Low Risk  (10/22/2022)  Physical Activity: Inactive (10/22/2022)  Social Connections: Socially Isolated (07/10/2023)  Stress: Stress Concern Present (01/29/2023)  Tobacco Use: Medium Risk (07/18/2023)    Readmission Risk Interventions     No data to display

## 2023-07-22 DIAGNOSIS — E538 Deficiency of other specified B group vitamins: Secondary | ICD-10-CM | POA: Diagnosis not present

## 2023-07-22 DIAGNOSIS — N179 Acute kidney failure, unspecified: Secondary | ICD-10-CM | POA: Diagnosis not present

## 2023-07-22 DIAGNOSIS — E8729 Other acidosis: Secondary | ICD-10-CM | POA: Diagnosis not present

## 2023-07-22 LAB — RENAL FUNCTION PANEL
Albumin: 3.4 g/dL — ABNORMAL LOW (ref 3.5–5.0)
Anion gap: 12 (ref 5–15)
BUN: 41 mg/dL — ABNORMAL HIGH (ref 8–23)
CO2: 25 mmol/L (ref 22–32)
Calcium: 9.2 mg/dL (ref 8.9–10.3)
Chloride: 97 mmol/L — ABNORMAL LOW (ref 98–111)
Creatinine, Ser: 3.05 mg/dL — ABNORMAL HIGH (ref 0.61–1.24)
GFR, Estimated: 21 mL/min — ABNORMAL LOW (ref >60)
Glucose, Bld: 96 mg/dL (ref 70–99)
Phosphorus: 4.5 mg/dL (ref 2.5–4.6)
Potassium: 3.5 mmol/L (ref 3.5–5.1)
Sodium: 134 mmol/L — ABNORMAL LOW (ref 135–145)

## 2023-07-22 MED ORDER — CYANOCOBALAMIN 1000 MCG PO TABS: 1000.0000 ug | ORAL_TABLET | Freq: Every day | ORAL | 0 refills | Status: AC

## 2023-07-22 NOTE — Plan of Care (Signed)
  Problem: Education: Goal: Ability to describe self-care measures that may prevent or decrease complications (Diabetes Survival Skills Education) will improve Outcome: Progressing Goal: Individualized Educational Video(s) Outcome: Progressing   Problem: Coping: Goal: Ability to adjust to condition or change in health will improve Outcome: Progressing   Problem: Education: Goal: Knowledge of General Education information will improve Description: Including pain rating scale, medication(s)/side effects and non-pharmacologic comfort measures Outcome: Progressing

## 2023-07-22 NOTE — Progress Notes (Signed)
 [facility]  ROUNDING NOTE   Subjective:   Patient seen resting in bed Alert and oriented Tolerating meals without nausea or vomiting Reports itching from permcath site.   Urine output 1.5L Creatinine 3.05  Objective:  Vital signs in last 24 hours:  Temp:  [98.1 F (36.7 C)-98.6 F (37 C)] 98.6 F (37 C) (03/11 0735) Pulse Rate:  [65-74] 65 (03/11 0735) Resp:  [18-20] 18 (03/11 0735) BP: (107-126)/(57-77) 126/68 (03/11 0735) SpO2:  [93 %-96 %] 93 % (03/11 0735)  Weight change:  Filed Weights   07/18/23 0811 07/19/23 0757 07/19/23 1330  Weight: 90.6 kg 90.6 kg 89.8 kg    Intake/Output: I/O last 3 completed shifts: In: -  Out: 1500 [Urine:1500]   Intake/Output this shift:  Total I/O In: 0  Out: 200 [Urine:200]  Physical Exam: General: NAD  Head: Normocephalic, atraumatic. Moist oral mucosal membranes  Eyes: Anicteric  Lungs:  Clear to auscultation, normal effort  Heart: Regular rate and rhythm  Abdomen:  Soft, nontender, distention  Extremities: No peripheral edema.  Neurologic: Alert and awake, moving all four extremities  Skin: No lesions  Access:  Rt internal jugular permcath    Basic Metabolic Panel: Recent Labs  Lab 07/18/23 0416 07/19/23 0408 07/20/23 0614 07/21/23 0909 07/22/23 0410  NA 137 137 134* 135 134*  K 3.7 3.4* 3.8 3.7 3.5  CL 97* 98 99 96* 97*  CO2 28 31 27 28 25   GLUCOSE 111* 110* 113* 115* 96  BUN 21 25* 18 30* 41*  CREATININE 2.86* 3.19* 2.56* 3.11* 3.05*  CALCIUM 9.3 9.1 9.1 9.5 9.2  PHOS  --   --   --  3.4 4.5    Liver Function Tests: Recent Labs  Lab 07/21/23 0909 07/22/23 0410  ALBUMIN 3.7 3.4*    No results for input(s): "LIPASE", "AMYLASE" in the last 168 hours. No results for input(s): "AMMONIA" in the last 168 hours.  CBC: Recent Labs  Lab 07/17/23 0838 07/19/23 0811 07/20/23 0614  WBC 5.7 5.8 5.8  HGB 11.1* 11.5* 11.5*  HCT 32.3* 34.6* 33.3*  MCV 93.4 94.3 94.1  PLT 113* 139* 137*     Cardiac Enzymes: No results for input(s): "CKTOTAL", "CKMB", "CKMBINDEX", "TROPONINI" in the last 168 hours.  BNP: Invalid input(s): "POCBNP"  CBG: Recent Labs  Lab 07/15/23 1447  GLUCAP 94    Microbiology: Results for orders placed or performed during the hospital encounter of 07/09/23  Culture, blood (Routine X 2) w Reflex to ID Panel     Status: None   Collection Time: 07/15/23 10:02 PM   Specimen: BLOOD  Result Value Ref Range Status   Specimen Description BLOOD LEFT ARM  Final   Special Requests   Final    BOTTLES DRAWN AEROBIC AND ANAEROBIC Blood Culture adequate volume   Culture   Final    NO GROWTH 5 DAYS Performed at [facility], [address] [place], [place] [address]    Report Status 07/20/2023 FINAL  Final  Culture, blood (Routine X 2) w Reflex to ID Panel     Status: None   Collection Time: 07/15/23 10:02 PM   Specimen: BLOOD  Result Value Ref Range Status   Specimen Description BLOOD RIGHT ARM  Final   Special Requests   Final    BOTTLES DRAWN AEROBIC AND ANAEROBIC Blood Culture adequate volume   Culture   Final    NO GROWTH 5 DAYS Performed at [facility], [address]  [place], [place] [address]    Report Status 07/20/2023 FINAL  Final  Resp panel by RT-PCR (RSV, Flu A&B, Covid) Anterior Nasal Swab     Status: None   Collection Time: 07/15/23 10:24 PM   Specimen: Anterior Nasal Swab  Result Value Ref Range Status   SARS Coronavirus 2 by RT PCR NEGATIVE NEGATIVE Final    Comment: (NOTE) SARS-CoV-2 target nucleic acids are NOT DETECTED.  The SARS-CoV-2 RNA is generally detectable in upper respiratory specimens during the acute phase of infection. The lowest concentration of SARS-CoV-2 viral copies this assay can detect is 138 copies/mL. A negative result does not preclude SARS-Cov-2 infection and should not be used as the sole basis for treatment or other patient management decisions. A negative result may occur with   improper specimen collection/handling, submission of specimen other than nasopharyngeal swab, presence of viral mutation(s) within the areas targeted by this assay, and inadequate number of viral copies(<138 copies/mL). A negative result must be combined with clinical observations, patient history, and epidemiological information. The expected result is Negative.  Fact Sheet for Patients:  BloggerCourse.com  Fact Sheet for Healthcare Providers:  SeriousBroker.it  This test is no t yet approved or cleared by the [place] FDA and  has been authorized for detection and/or diagnosis of SARS-CoV-2 by FDA under an Emergency Use Authorization (EUA). This EUA will remain  in effect (meaning this test can be used) for the duration of the COVID-19 declaration under Section 564(b)(1) of the Act, 21 U.S.C.section 360bbb-3(b)(1), unless the authorization is terminated  or revoked sooner.       Influenza A by PCR NEGATIVE NEGATIVE Final   Influenza B by PCR NEGATIVE NEGATIVE Final    Comment: (NOTE) The Xpert Xpress SARS-CoV-2/FLU/RSV plus assay is intended as an aid in the diagnosis of influenza from Nasopharyngeal swab specimens and should not be used as a sole basis for treatment. Nasal washings and aspirates are unacceptable for Xpert Xpress SARS-CoV-2/FLU/RSV testing.  Fact Sheet for Patients: BloggerCourse.com  Fact Sheet for Healthcare Providers: SeriousBroker.it  This test is not yet approved or cleared by the [place] FDA and has been authorized for detection and/or diagnosis of SARS-CoV-2 by FDA under an Emergency Use Authorization (EUA). This EUA will remain in effect (meaning this test can be used) for the duration of the COVID-19 declaration under Section 564(b)(1) of the Act, 21 U.S.C. section 360bbb-3(b)(1), unless the authorization is terminated or revoked.      Resp Syncytial Virus by PCR NEGATIVE NEGATIVE Final    Comment: (NOTE) Fact Sheet for Patients: BloggerCourse.com  Fact Sheet for Healthcare Providers: SeriousBroker.it  This test is not yet approved or cleared by the [place] FDA and has been authorized for detection and/or diagnosis of SARS-CoV-2 by FDA under an Emergency Use Authorization (EUA). This EUA will remain in effect (meaning this test can be used) for the duration of the COVID-19 declaration under Section 564(b)(1) of the Act, 21 U.S.C. section 360bbb-3(b)(1), unless the authorization is terminated or revoked.  Performed at [facility], [address] [place], [place] [address]     Coagulation Studies: No results for input(s): "LABPROT", "INR" in the last 72 hours.   Urinalysis: No results for input(s): "COLORURINE", "LABSPEC", "PHURINE", "GLUCOSEU", "HGBUR", "BILIRUBINUR", "KETONESUR", "PROTEINUR", "UROBILINOGEN", "NITRITE", "LEUKOCYTESUR" in the last 72 hours.  Invalid input(s): "APPERANCEUR"     Imaging: No results found.     Medications:      amitriptyline  75 mg Oral QHS  aspirin EC  81 mg Oral Daily   Chlorhexidine Gluconate Cloth  6 each Topical Q0600   Chlorhexidine Gluconate Cloth  6 each Topical Q0600   vitamin B-12  1,000 mcg Oral Daily   feeding supplement  237 mL Oral BID BM   folic acid  1 mg Oral Daily   heparin injection (subcutaneous)  5,000 Units Subcutaneous Q8H   multivitamin with minerals  1 tablet Oral Daily   nicotine  14 mg Transdermal Daily   QUEtiapine  50 mg Oral QHS   rosuvastatin  40 mg Oral Daily   thiamine  100 mg Oral Daily   acetaminophen **OR** acetaminophen, albuterol, haloperidol lactate, HYDROcodone-acetaminophen, ondansetron **OR** ondansetron (ZOFRAN) IV, mouth rinse  Assessment/ Plan:  [name] IV is a 73 y.o.  male with past medical conditions including alcohol use  disorder, COPD with 2 L nasal cannula, diet-controlled diabetes, hepatitis C, seizure disorder, hypertension, and vitamin B12 deficiency, who was admitted to [facility] on 07/09/2023 for Thrombocytopenia (HCC) [D69.6] AKI (acute kidney injury) (HCC) [N17.9] Frequent falls [R29.6] Increased anion gap metabolic acidosis [E87.29] Injury of head, initial encounter [S09.90XA]   Acute kidney injury likely secondary to dehydration with NSAID use. Normal renal function noted 2 weeks prior. Renal ultrasound negative for hydronephrosis. No recent contrast exposure. Continue holding ibuprofen and lisinopril.  Autoimmune and complements within desired range. Kappa lambda ratio elevated, not uncommon with renal failure. Appreciate vascular placing permcath on 07/18/2023.  - Renal function remained stable, last dialysis treatment was on Saturday - Will continue to hold dialysis and evaluate.  - Patient cleared ti discharge from renal stance. Will have labs drawn in office tomorrow.  - Labs will determine if further dialysis is needed.   Renal navigator has confirmed outpatient dialysis clinic at [facility] on a TTS schedule. Will hold this chair time until labs are reviewed.    Lab Results  Component Value Date   CREATININE 3.05 (H) 07/22/2023   CREATININE 3.11 (H) 07/21/2023   CREATININE 2.56 (H) 07/20/2023    Intake/Output Summary (Last 24 hours) at 07/22/2023 1419 Last data filed at 07/22/2023 1048 Gross per 24 hour  Intake 0 ml  Output 1400 ml  Net -1400 ml    2.  Hypertension, essential.  Home regimen includes amlodipine and lisinopril.  Both currently held.   Blood pressure stale   3. Diabetes mellitus type II, renal manifestations: noninsulin dependent. Most recent hemoglobin A1c is 6.1 on 06/25/23.    Diet controlled  4. Anemia with thrombocytopenia Lab Results  Component Value Date   HGB 11.5 (L) 07/20/2023    Will continue to monitor and consider ESA below 10.  Hemoglobin remains  within optimal range.    LOS: 13 Annah Jasko 3/11/20252:19 PM

## 2023-07-22 NOTE — Discharge Summary (Signed)
 Physician Discharge Summary   Patient: [name] MRN: [id number] DOB: [birth year]  Admit date:     07/09/2023  Discharge date: 07/22/23  Discharge Physician: Marrion Coy   PCP: Sallee Provencal, FNP   Recommendations at discharge:    Follow-up with PCP in 1 week. Follow-up with nephrology: Have lab work done tomorrow, decide if further dialysis is needed.   Discharge Diagnoses: Principal Problem:   AKI (acute kidney injury) (HCC) Active Problems:   High anion gap metabolic acidosis   Thrombocytopenia (HCC)   Anemia   Frequent falls   Complex regional pain syndrome of both lower extremities   Vitamin B12 deficiency   COPD (chronic obstructive pulmonary disease) (HCC)   Chronic respiratory failure with hypoxia (HCC)   Seizure disorder (HCC)   Essential hypertension   Tobacco use disorder   History of hepatitis C   Nasopharyngeal carcinoma (HCC)   Type 2 diabetes mellitus without complication, without long-term current use of insulin (HCC)   Dementia without behavioral disturbance (HCC)   Alcohol use disorder   Class 1 obesity  Resolved Problems:   * No resolved hospital problems. *  Hospital Course: MATTIE NOVOSEL IV is a 73 y.o. 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  Patient was found to have oliguric acute renal failure, renal function not improving after IV fluids, dialysis was started on 3/1. Initially required nursing placement, but condition has improved.  No longer need SNF. Patient renal function also improving, discussed with nephrology, they will keep with dialysis chair, but will follow-up in the office to see if he still needs it.  Currently he is medically stable  for discharge.  Assessment and Plan: AKI (acute kidney injury) (HCC) secondary to ATN, now needing dialysis High anion gap metabolic acidosis Hypokalemia. Hyponatremia. --unclear etiology.  Pos ibuprofen use, recent lisinopril.  Oliguric.   --nephrology consulted, started on dialysis on 3/1 Renal function stabilizing/improving. Discussed with nephrology for outpatient dialysis plan and permacath. Permacath placed on 3/7.  Patient renal function appears to be improving significantly, has a large amount of urine output.  Nephrology has decided that the patient can be discharged home, follow-up with them for lab work and decision about further dialysis need.     Vitamin B12 deficient anemia Thrombocytopenia Condition is still stable, started oral B12. Condition is stable.   Frequent falls Generalized weakness Condition has improved, currently no longer need nursing placement.  Will discharge with home care.   diabetic polyneuropathy Complex regional pain syndrome of both lower extremities Discontinue Neurontin due to renal failure.   COPD (chronic obstructive pulmonary disease) (HCC) Chronic respiratory failure with hypoxia Not acutely exacerbated   Alcohol use disorder Thiamine folate and multivitamin, no alcohol withdrawal while in the hospital.   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  Followed by oncology and ENT   History of hepatitis C Hep C RNA negative.  Tobacco use disorder Nicotine patch   Essential hypertension .  Hold blood pressure medicine, blood  pressure not significant elevated.   Seizure disorder [facility]) Not on specific AEDs   Fever --100.8 early morning of 3/4 Respiratory panel was negative, no fever over the last 24 hours. Recent UA negative.  I obtained a chest x-ray, no evidence of pneumonia. Repeat blood culture negative for 2 days. No additional fever, antibiotics not need           Consultants: Nephrology Procedures performed: HD and permacath. Disposition: Home health   Diet recommendation:  Discharge Diet Orders (From admission, onward)     Start     Ordered   07/22/23 0000  Diet - low sodium heart healthy        07/22/23 1307           Cardiac diet DISCHARGE MEDICATION: Allergies as of 07/22/2023       Reactions   Opana [oxymorphone Hcl]    Made him BLACKOUT   Elemental Sulfur    Childhood reaction    Sulfa Antibiotics Other (See Comments)   Sulfa Antibiotics         Medication List     STOP taking these medications    amLODipine 10 MG tablet Commonly known as: NORVASC   gabapentin 300 MG capsule Commonly known as: Neurontin   ibuprofen 600 MG tablet Commonly known as: ADVIL   lisinopril 10 MG tablet Commonly known as: ZESTRIL       TAKE these medications    acetaminophen 500 MG tablet Commonly known as: TYLENOL Take 2 tablets (1,000 mg total) by mouth every 6 (six) hours as needed for mild pain (pain score 1-3).   amitriptyline 75 MG tablet Commonly known as: ELAVIL Take 1 tablet (75 mg total) by mouth at bedtime.   antiseptic oral rinse Liqd 15 mLs by Mouth Rinse route as needed for dry mouth.   aspirin EC 81 MG tablet Take 81 mg by mouth daily. Swallow whole.   Blood Glucose Monitoring Suppl Devi 1 each by Does not apply route in the morning, at noon, and at bedtime. May substitute to any manufacturer covered by patient's insurance. Pt's current glucometer is broken.   cyanocobalamin 1000 MCG tablet Take 1 tablet (1,000 mcg total) by mouth daily. Start  taking on: July 23, 2023   nystatin 100000 UNIT/ML suspension Commonly known as: MYCOSTATIN Take 5 mLs by mouth daily as needed (thrush).   omeprazole 40 MG capsule Commonly known as: PRILOSEC TAKE 1 CAPSULE BY MOUTH ONCE DAILY   rosuvastatin 40 MG tablet Commonly known as: CRESTOR Take 1 tablet (40 mg total) by mouth daily.        Contact information for follow-up providers     Sallee Provencal, FNP Follow up in 1 week(s).   Specialty: Family Medicine Contact information: [address] [place] [address] [phone]         Mosetta Pigeon, MD Follow up.   Specialty: Nephrology Why: His office will arrange lab work, follow up, and determine if HD still needed. Contact information: [address] [place] [address] [phone]              Contact information for after-discharge care     Destination     [facility] [place] .   Service: Skilled Paramedic information: [address] [place] [address] [phone]  Discharge Exam: Filed Weights   07/18/23 1610 07/19/23 0757 07/19/23 1330  Weight: 90.6 kg 90.6 kg 89.8 kg   General exam: Appears calm and comfortable  Respiratory system: Clear to auscultation. Respiratory effort normal. Cardiovascular system: S1 & S2 heard, RRR. No JVD, murmurs, rubs, gallops or clicks. No pedal edema. Gastrointestinal system: Abdomen is nondistended, soft and nontender. No organomegaly or masses felt. Normal bowel sounds heard. Central nervous system: Alert and oriented. No focal neurological deficits. Extremities: Symmetric 5 x 5 power. Skin: No rashes, lesions or ulcers Psychiatry: Judgement and insight appear normal. Mood & affect appropriate.    Condition at discharge: good  The results of significant diagnostics from this hospitalization (including imaging, microbiology, ancillary and laboratory) are listed below  for reference.   Imaging Studies: PERIPHERAL VASCULAR CATHETERIZATION Result Date: 07/18/2023 See surgical note for result.  DG Chest 2 View Result Date: 07/16/2023 CLINICAL DATA:  Fever. EXAM: CHEST - 2 VIEW COMPARISON:  07/09/2023 FINDINGS: Right lung clear. Subtle retrocardiac left base atelectasis or infiltrate is new in the interval. No pulmonary edema or pleural effusion. The cardiopericardial silhouette is within normal limits for size. No acute bony abnormality. IMPRESSION: Retrocardiac left base atelectasis or infiltrate is new in the interval. Electronically Signed   By: Kennith Center M.D.   On: 07/16/2023 12:16   PERIPHERAL VASCULAR CATHETERIZATION Result Date: 07/11/2023 See surgical note for result.  US RENAL Result Date: 07/10/2023 CLINICAL DATA:  [id number] AKI (acute kidney injury) (HCC) [id number] EXAM: RENAL / URINARY TRACT ULTRASOUND COMPLETE COMPARISON:  None Available. FINDINGS: Right Kidney: Renal measurements: 10.3 x 5.4 x 5.3 cm = volume: 155 mL. Echogenicity within normal limits. No mass or hydronephrosis visualized. Left Kidney: Renal measurements: 11.3 x 6.8 x 4.3 cm = volume: 172 mL mL. Echogenicity within normal limits. No mass or hydronephrosis visualized. Bladder: Appears normal for degree of bladder distention. Other: None. IMPRESSION: No hydronephrosis or nephrolithiasis. Electronically Signed   By: Lorenza Cambridge M.D.   On: 07/10/2023 12:20   CT Cervical Spine Wo Contrast Result Date: 07/09/2023 CLINICAL DATA:  Larey Seat, hit head EXAM: CT CERVICAL SPINE WITHOUT CONTRAST TECHNIQUE: Multidetector CT imaging of the cervical spine was performed without intravenous contrast. Multiplanar CT image reconstructions were also generated. RADIATION DOSE REDUCTION: This exam was performed according to the departmental dose-optimization program which includes automated exposure control, adjustment of the mA and/or kV according to patient size and/or use of iterative reconstruction technique.  COMPARISON:  11/03/2019 FINDINGS: Alignment: Straightening of the cervical spine is likely positional. Otherwise alignment is anatomic. Skull base and vertebrae: No acute fracture. No primary bone lesion or focal pathologic process. Soft tissues and spinal canal: No prevertebral fluid or swelling. No visible canal hematoma. Disc levels: There is progressive multilevel spondylosis most pronounced at the C5-6 and C6-7 levels. Mild diffuse facet hypertrophy. Upper chest: Airway is patent.  Lung apices are clear. Other: Reconstructed images demonstrate no additional findings. IMPRESSION: 1. No acute cervical spine fracture. 2. Progressive multilevel cervical spondylosis, greatest at C5-6 and C6-7. Electronically Signed   By: Sharlet Salina M.D.   On: 07/09/2023 20:08   CT HEAD WO CONTRAST ( ) Result Date: 07/09/2023 CLINICAL DATA:  Multiple falls, hit head EXAM: CT HEAD WITHOUT CONTRAST TECHNIQUE: Contiguous axial images were obtained from the base of the skull through the vertex without intravenous contrast. RADIATION DOSE REDUCTION: This exam was performed according to the departmental dose-optimization program which includes automated exposure control, adjustment of the mA and/or kV according to  patient size and/or use of iterative reconstruction technique. COMPARISON:  09/25/2022 FINDINGS: Brain: No acute infarct or hemorrhage. Lateral ventricles and midline structures are unremarkable. No acute extra-axial fluid collections. No mass effect. Vascular: No hyperdense vessel or unexpected calcification. Skull: Normal. Negative for fracture or focal lesion. Sinuses/Orbits: No acute finding. Other: None. IMPRESSION: 1. No acute intracranial process. Electronically Signed   By: Sharlet Salina M.D.   On: 07/09/2023 20:06   DG Chest 2 View Result Date: 07/09/2023 CLINICAL DATA:  Multiple falls EXAM: CHEST - 2 VIEW COMPARISON:  02/15/2023 FINDINGS: Frontal and lateral views of the chest demonstrate an unremarkable  cardiac silhouette. No acute airspace disease, effusion, or pneumothorax. No acute bony abnormalities. IMPRESSION: 1. No acute intrathoracic process. Electronically Signed   By: Sharlet Salina M.D.   On: 07/09/2023 20:04    Microbiology: Results for orders placed or performed during the hospital encounter of 07/09/23  Culture, blood (Routine X 2) w Reflex to ID Panel     Status: None   Collection Time: 07/15/23 10:02 PM   Specimen: BLOOD  Result Value Ref Range Status   Specimen Description BLOOD LEFT ARM  Final   Special Requests   Final    BOTTLES DRAWN AEROBIC AND ANAEROBIC Blood Culture adequate volume   Culture   Final    NO GROWTH 5 DAYS Performed at [facility], [address] [place], [place] [address]    Report Status 07/20/2023 FINAL  Final  Culture, blood (Routine X 2) w Reflex to ID Panel     Status: None   Collection Time: 07/15/23 10:02 PM   Specimen: BLOOD  Result Value Ref Range Status   Specimen Description BLOOD RIGHT ARM  Final   Special Requests   Final    BOTTLES DRAWN AEROBIC AND ANAEROBIC Blood Culture adequate volume   Culture   Final    NO GROWTH 5 DAYS Performed at [facility], [address] [place], [place] [address]    Report Status 07/20/2023 FINAL  Final  Resp panel by RT-PCR (RSV, Flu A&B, Covid) Anterior Nasal Swab     Status: None   Collection Time: 07/15/23 10:24 PM   Specimen: Anterior Nasal Swab  Result Value Ref Range Status   SARS Coronavirus 2 by RT PCR NEGATIVE NEGATIVE Final    Comment: (NOTE) SARS-CoV-2 target nucleic acids are NOT DETECTED.  The SARS-CoV-2 RNA is generally detectable in upper respiratory specimens during the acute phase of infection. The lowest concentration of SARS-CoV-2 viral copies this assay can detect is 138 copies/mL. A negative result does not preclude SARS-Cov-2 infection and should not be used as the sole basis for treatment or other patient management decisions. A negative result  may occur with  improper specimen collection/handling, submission of specimen other than nasopharyngeal swab, presence of viral mutation(s) within the areas targeted by this assay, and inadequate number of viral copies(<138 copies/mL). A negative result must be combined with clinical observations, patient history, and epidemiological information. The expected result is Negative.  Fact Sheet for Patients:  BloggerCourse.com  Fact Sheet for Healthcare Providers:  SeriousBroker.it  This test is no t yet approved or cleared by the [place] FDA and  has been authorized for detection and/or diagnosis of SARS-CoV-2 by FDA under an Emergency Use Authorization (EUA). This EUA will remain  in effect (meaning this test can be used) for the duration of the COVID-19 declaration under Section 564(b)(1) of the Act, 21 U.S.C.section 360bbb-3(b)(1), unless the authorization  is terminated  or revoked sooner.       Influenza A by PCR NEGATIVE NEGATIVE Final   Influenza B by PCR NEGATIVE NEGATIVE Final    Comment: (NOTE) The Xpert Xpress SARS-CoV-2/FLU/RSV plus assay is intended as an aid in the diagnosis of influenza from Nasopharyngeal swab specimens and should not be used as a sole basis for treatment. Nasal washings and aspirates are unacceptable for Xpert Xpress SARS-CoV-2/FLU/RSV testing.  Fact Sheet for Patients: BloggerCourse.com  Fact Sheet for Healthcare Providers: SeriousBroker.it  This test is not yet approved or cleared by the [place] FDA and has been authorized for detection and/or diagnosis of SARS-CoV-2 by FDA under an Emergency Use Authorization (EUA). This EUA will remain in effect (meaning this test can be used) for the duration of the COVID-19 declaration under Section 564(b)(1) of the Act, 21 U.S.C. section 360bbb-3(b)(1), unless the authorization is terminated  or revoked.     Resp Syncytial Virus by PCR NEGATIVE NEGATIVE Final    Comment: (NOTE) Fact Sheet for Patients: BloggerCourse.com  Fact Sheet for Healthcare Providers: SeriousBroker.it  This test is not yet approved or cleared by the [place] FDA and has been authorized for detection and/or diagnosis of SARS-CoV-2 by FDA under an Emergency Use Authorization (EUA). This EUA will remain in effect (meaning this test can be used) for the duration of the COVID-19 declaration under Section 564(b)(1) of the Act, 21 U.S.C. section 360bbb-3(b)(1), unless the authorization is terminated or revoked.  Performed at [facility], [address] [place], [place] [address]     Labs: CBC: Recent Labs  Lab 07/17/23 [id number] 07/19/23 0811 07/20/23 0614  WBC 5.7 5.8 5.8  HGB 11.1* 11.5* 11.5*  HCT 32.3* 34.6* 33.3*  MCV 93.4 94.3 94.1  PLT 113* 139* 137*   Basic Metabolic Panel: Recent Labs  Lab 07/18/23 0416 07/19/23 0408 07/20/23 0614 07/21/23 0909 07/22/23 0410  NA 137 137 134* 135 134*  K 3.7 3.4* 3.8 3.7 3.5  CL 97* 98 99 96* 97*  CO2 28 31 27 28 25   GLUCOSE 111* 110* 113* 115* 96  BUN 21 25* 18 30* 41*  CREATININE 2.86* 3.19* 2.56* 3.11* 3.05*  CALCIUM 9.3 9.1 9.1 9.5 9.2  PHOS  --   --   --  3.4 4.5   Liver Function Tests: Recent Labs  Lab 07/21/23 0909 07/22/23 0410  ALBUMIN 3.7 3.4*   CBG: Recent Labs  Lab 07/15/23 1447  GLUCAP 94    Discharge time spent: greater than 30 minutes.  Signed: Marrion Coy, MD [facility] 07/22/2023

## 2023-07-22 NOTE — Discharge Planning (Signed)
 PLACEMENT RESOLVED Outpatient Facility [facility]  [address] [place], [place] [address] [phone]  Schedule: TTS 11:30am Start date: Thursday 3/13 11:00am  Dimas Chyle Dialysis Coordinator II  [facility] Cell: [phone] eFax: [phone] [email or web address]

## 2023-07-22 NOTE — Discharge Summary (Deleted)
 Physician Discharge Summary   Patient: [name] MRN: [id number] DOB: 16-Oct-1950  Admit date:     07/09/2023  Discharge date: 07/22/23  Discharge Physician: Marrion Coy   PCP: Sallee Provencal, FNP   Recommendations at discharge:   Follow-up with PCP in 1 week. Follow-up with nephrology: Have lab work done tomorrow, decide if further dialysis is needed.  Discharge Diagnoses: Principal Problem:   AKI (acute kidney injury) (HCC) Active Problems:   High anion gap metabolic acidosis   Thrombocytopenia (HCC)   Anemia   Frequent falls   Complex regional pain syndrome of both lower extremities   Vitamin B12 deficiency   COPD (chronic obstructive pulmonary disease) (HCC)   Chronic respiratory failure with hypoxia (HCC)   Seizure disorder (HCC)   Essential hypertension   Tobacco use disorder   History of hepatitis C   Nasopharyngeal carcinoma (HCC)   Type 2 diabetes mellitus without complication, without long-term current use of insulin (HCC)   Dementia without behavioral disturbance (HCC)   Alcohol use disorder   Class 1 obesity  Resolved Problems:   * No resolved hospital problems. *  Hospital Course: [name] is a 73 y.o. 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  Patient was found to have oliguric acute renal failure, renal function not improving after West fluids, dialysis was started on 3/1. Initially required nursing placement, but condition has improved.  No longer need SNF. Patient renal function also improving, discussed with nephrology, they will keep with dialysis chair, but will follow-up in the office to see if he still needs it.  Currently he is medically stable for  discharge.  Assessment and Plan: AKI (acute kidney injury) (HCC) secondary to ATN, now needing dialysis High anion gap metabolic acidosis Hypokalemia. Hyponatremia. --unclear etiology.  Pos ibuprofen use, recent lisinopril.  Oliguric.   --nephrology consulted, started on dialysis on 3/1 Renal function stabilizing/improving. Discussed with nephrology for outpatient dialysis plan and permacath. Permacath placed on 3/7.  Patient renal function appears to be improving significantly, has a large amount of urine output.  Nephrology has decided that the patient can be discharged home, follow-up with them for lab work and decision about further dialysis need.    Vitamin B12 deficient anemia Thrombocytopenia Condition is still stable, started oral B12. Condition is stable.   Frequent falls Generalized weakness Condition has improved, currently no longer need nursing placement.  Will discharge with home care.   diabetic polyneuropathy Complex regional pain syndrome of both lower extremities Home treatment.   COPD (chronic obstructive pulmonary disease) (HCC) Chronic respiratory failure with hypoxia Not acutely exacerbated   Alcohol use disorder Thiamine folate and multivitamin, no alcohol withdrawal while in the hospital.   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  Followed by oncology and ENT   History of hepatitis C Hep C RNA negative.   Tobacco use disorder Nicotine patch  Essential hypertension .  Hold blood pressure medicine, blood pressure not significant elevated.    Seizure disorder [name]) Not on specific AEDs   Fever --100.8 early morning of 3/4 Respiratory panel was negative, no fever over the last 24 hours. Recent UA negative.  I obtained a chest x-ray, no evidence of pneumonia. Repeat blood culture negative for 2 days. No additional fever, antibiotics not need       Consultants: Nephrology Procedures performed: HD and permacath. Disposition: Home health Diet recommendation:  Discharge Diet Orders (From admission, onward)     Start     Ordered   07/22/23 0000  Diet - low sodium heart healthy        07/22/23 1307           Cardiac diet DISCHARGE MEDICATION: Allergies as of 07/22/2023       Reactions   Opana [oxymorphone Hcl]    Made him BLACKOUT   Elemental Sulfur    Childhood reaction    Sulfa Antibiotics Other (See Comments)   Sulfa Antibiotics         Medication List     STOP taking these medications    amLODipine 10 MG tablet Commonly known as: NORVASC   ibuprofen 600 MG tablet Commonly known as: ADVIL   lisinopril 10 MG tablet Commonly known as: ZESTRIL       TAKE these medications    acetaminophen 500 MG tablet Commonly known as: TYLENOL Take 2 tablets (1,000 mg total) by mouth every 6 (six) hours as needed for mild pain (pain score 1-3).   amitriptyline 75 MG tablet Commonly known as: ELAVIL Take 1 tablet (75 mg total) by mouth at bedtime.   antiseptic oral rinse Liqd 15 mLs by Mouth Rinse route as needed for dry mouth.   aspirin EC 81 MG tablet Take 81 mg by mouth daily. Swallow whole.   Blood Glucose Monitoring Suppl Devi 1 each by Does not apply route in the morning, at noon, and at bedtime. May substitute to any manufacturer covered by patient's insurance. Pt's current glucometer is broken.   cyanocobalamin 1000 MCG tablet Take 1 tablet (1,000 mcg total) by mouth daily. Start taking on: July 23, 2023   gabapentin 300 MG capsule Commonly known as: Neurontin Take 1 capsule (300 mg  total) by mouth 2 (two) times daily for 15 days.   nystatin 100000 UNIT/ML suspension Commonly known as: MYCOSTATIN Take 5 mLs by mouth daily as needed (thrush).   omeprazole 40 MG capsule Commonly known as: PRILOSEC TAKE 1 CAPSULE BY MOUTH ONCE DAILY   rosuvastatin 40 MG tablet Commonly known as: CRESTOR Take 1 tablet (40 mg total) by mouth daily.        Contact information for follow-up providers     Sallee Provencal, FNP Follow up in 1 week(s).   Specialty: Family Medicine Contact information: [address] [place] [address] [phone]         Mosetta Pigeon, MD Follow up.   Specialty: Nephrology Why: His office will arrange lab work, follow up, and determine if HD still needed. Contact information: [address] [place] [address] [phone]              Contact information for after-discharge care     Destination     [facility] [place] .   Service: Skilled Paramedic information: [address] [place] [address] [phone]  Discharge Exam: Filed Weights   07/18/23 1610 07/19/23 0757 07/19/23 1330  Weight: 90.6 kg 90.6 kg 89.8 kg   General exam: Appears calm and comfortable  Respiratory system: Clear to auscultation. Respiratory effort normal. Cardiovascular system: S1 & S2 heard, RRR. No JVD, murmurs, rubs, gallops or clicks. No pedal edema. Gastrointestinal system: Abdomen is nondistended, soft and nontender. No organomegaly or masses felt. Normal bowel sounds heard. Central nervous system: Alert and oriented. No focal neurological deficits. Extremities: Symmetric 5 x 5 power. Skin: No rashes, lesions or ulcers Psychiatry: Judgement and insight appear normal. Mood & affect appropriate.    Condition at discharge: good  The results of significant diagnostics from this hospitalization (including imaging, microbiology, ancillary and  laboratory) are listed below for reference.   Imaging Studies: PERIPHERAL VASCULAR CATHETERIZATION Result Date: 07/18/2023 See surgical note for result.  DG Chest 2 View Result Date: 07/16/2023 CLINICAL DATA:  Fever. EXAM: CHEST - 2 VIEW COMPARISON:  07/09/2023 FINDINGS: Right lung clear. Subtle retrocardiac left base atelectasis or infiltrate is new in the interval. No pulmonary edema or pleural effusion. The cardiopericardial silhouette is within normal limits for size. No acute bony abnormality. IMPRESSION: Retrocardiac left base atelectasis or infiltrate is new in the interval. Electronically Signed   By: Kennith Center M.D.   On: 07/16/2023 12:16   PERIPHERAL VASCULAR CATHETERIZATION Result Date: 07/11/2023 See surgical note for result.  US RENAL Result Date: 07/10/2023 CLINICAL DATA:  [id number] AKI (acute kidney injury) (HCC) [id number] EXAM: RENAL / URINARY TRACT ULTRASOUND COMPLETE COMPARISON:  None Available. FINDINGS: Right Kidney: Renal measurements: 10.3 x 5.4 x 5.3 cm = volume: 155 mL. Echogenicity within normal limits. No mass or hydronephrosis visualized. Left Kidney: Renal measurements: 11.3 x 6.8 x 4.3 cm = volume: 172 mL mL. Echogenicity within normal limits. No mass or hydronephrosis visualized. Bladder: Appears normal for degree of bladder distention. Other: None. IMPRESSION: No hydronephrosis or nephrolithiasis. Electronically Signed   By: Lorenza Cambridge M.D.   On: 07/10/2023 12:20   CT Cervical Spine Wo Contrast Result Date: 07/09/2023 CLINICAL DATA:  Larey Seat, hit head EXAM: CT CERVICAL SPINE WITHOUT CONTRAST TECHNIQUE: Multidetector CT imaging of the cervical spine was performed without intravenous contrast. Multiplanar CT image reconstructions were also generated. RADIATION DOSE REDUCTION: This exam was performed according to the departmental dose-optimization program which includes automated exposure control, adjustment of the mA and/or kV according to patient size and/or use of iterative  reconstruction technique. COMPARISON:  11/03/2019 FINDINGS: Alignment: Straightening of the cervical spine is likely positional. Otherwise alignment is anatomic. Skull base and vertebrae: No acute fracture. No primary bone lesion or focal pathologic process. Soft tissues and spinal canal: No prevertebral fluid or swelling. No visible canal hematoma. Disc levels: There is progressive multilevel spondylosis most pronounced at the C5-6 and C6-7 levels. Mild diffuse facet hypertrophy. Upper chest: Airway is patent.  Lung apices are clear. Other: Reconstructed images demonstrate no additional findings. IMPRESSION: 1. No acute cervical spine fracture. 2. Progressive multilevel cervical spondylosis, greatest at C5-6 and C6-7. Electronically Signed   By: Sharlet Salina M.D.   On: 07/09/2023 20:08   CT HEAD WO CONTRAST ( ) Result Date: 07/09/2023 CLINICAL DATA:  Multiple falls, hit head EXAM: CT HEAD WITHOUT CONTRAST TECHNIQUE: Contiguous axial images were obtained from the base of the skull through the vertex without intravenous contrast. RADIATION DOSE REDUCTION: This exam was performed according to the departmental dose-optimization program which includes automated exposure control, adjustment of the mA and/or kV according to  patient size and/or use of iterative reconstruction technique. COMPARISON:  09/25/2022 FINDINGS: Brain: No acute infarct or hemorrhage. Lateral ventricles and midline structures are unremarkable. No acute extra-axial fluid collections. No mass effect. Vascular: No hyperdense vessel or unexpected calcification. Skull: Normal. Negative for fracture or focal lesion. Sinuses/Orbits: No acute finding. Other: None. IMPRESSION: 1. No acute intracranial process. Electronically Signed   By: Sharlet Salina M.D.   On: 07/09/2023 20:06   DG Chest 2 View Result Date: 07/09/2023 CLINICAL DATA:  Multiple falls EXAM: CHEST - 2 VIEW COMPARISON:  02/15/2023 FINDINGS: Frontal and lateral views of the chest  demonstrate an unremarkable cardiac silhouette. No acute airspace disease, effusion, or pneumothorax. No acute bony abnormalities. IMPRESSION: 1. No acute intrathoracic process. Electronically Signed   By: Sharlet Salina M.D.   On: 07/09/2023 20:04    Microbiology: Results for orders placed or performed during the hospital encounter of 07/09/23  Culture, blood (Routine X 2) w Reflex to ID Panel     Status: None   Collection Time: 07/15/23 10:02 PM   Specimen: BLOOD  Result Value Ref Range Status   Specimen Description BLOOD LEFT ARM  Final   Special Requests   Final    BOTTLES DRAWN AEROBIC AND ANAEROBIC Blood Culture adequate volume   Culture   Final    NO GROWTH 5 DAYS Performed at [facility], [address] [place], [place] [address]    Report Status 07/20/2023 FINAL  Final  Culture, blood (Routine X 2) w Reflex to ID Panel     Status: None   Collection Time: 07/15/23 10:02 PM   Specimen: BLOOD  Result Value Ref Range Status   Specimen Description BLOOD RIGHT ARM  Final   Special Requests   Final    BOTTLES DRAWN AEROBIC AND ANAEROBIC Blood Culture adequate volume   Culture   Final    NO GROWTH 5 DAYS Performed at [facility], [address] [place], [place] [address]    Report Status 07/20/2023 FINAL  Final  Resp panel by RT-PCR (RSV, Flu A&B, Covid) Anterior Nasal Swab     Status: None   Collection Time: 07/15/23 10:24 PM   Specimen: Anterior Nasal Swab  Result Value Ref Range Status   SARS Coronavirus 2 by RT PCR NEGATIVE NEGATIVE Final    Comment: (NOTE) SARS-CoV-2 target nucleic acids are NOT DETECTED.  The SARS-CoV-2 RNA is generally detectable in upper respiratory specimens during the acute phase of infection. The lowest concentration of SARS-CoV-2 viral copies this assay can detect is 138 copies/mL. A negative result does not preclude SARS-Cov-2 infection and should not be used as the sole basis for treatment or other patient management  decisions. A negative result may occur with  improper specimen collection/handling, submission of specimen other than nasopharyngeal swab, presence of viral mutation(s) within the areas targeted by this assay, and inadequate number of viral copies(<138 copies/mL). A negative result must be combined with clinical observations, patient history, and epidemiological information. The expected result is Negative.  Fact Sheet for Patients:  BloggerCourse.com  Fact Sheet for Healthcare Providers:  SeriousBroker.it  This test is no t yet approved or cleared by the [place] FDA and  has been authorized for detection and/or diagnosis of SARS-CoV-2 by FDA under an Emergency Use Authorization (EUA). This EUA will remain  in effect (meaning this test can be used) for the duration of the COVID-19 declaration under Section 564(b)(1) of the Act, 21 U.S.C.section 360bbb-3(b)(1), unless the authorization  is terminated  or revoked sooner.       Influenza A by PCR NEGATIVE NEGATIVE Final   Influenza B by PCR NEGATIVE NEGATIVE Final    Comment: (NOTE) The Xpert Xpress SARS-CoV-2/FLU/RSV plus assay is intended as an aid in the diagnosis of influenza from Nasopharyngeal swab specimens and should not be used as a sole basis for treatment. Nasal washings and aspirates are unacceptable for Xpert Xpress SARS-CoV-2/FLU/RSV testing.  Fact Sheet for Patients: BloggerCourse.com  Fact Sheet for Healthcare Providers: SeriousBroker.it  This test is not yet approved or cleared by the [place] FDA and has been authorized for detection and/or diagnosis of SARS-CoV-2 by FDA under an Emergency Use Authorization (EUA). This EUA will remain in effect (meaning this test can be used) for the duration of the COVID-19 declaration under Section 564(b)(1) of the Act, 21 U.S.C. section 360bbb-3(b)(1), unless the  authorization is terminated or revoked.     Resp Syncytial Virus by PCR NEGATIVE NEGATIVE Final    Comment: (NOTE) Fact Sheet for Patients: BloggerCourse.com  Fact Sheet for Healthcare Providers: SeriousBroker.it  This test is not yet approved or cleared by the [place] FDA and has been authorized for detection and/or diagnosis of SARS-CoV-2 by FDA under an Emergency Use Authorization (EUA). This EUA will remain in effect (meaning this test can be used) for the duration of the COVID-19 declaration under Section 564(b)(1) of the Act, 21 U.S.C. section 360bbb-3(b)(1), unless the authorization is terminated or revoked.  Performed at [facility], [address] [place], [place] [address]     Labs: CBC: Recent Labs  Lab 07/17/23 [phone] 07/19/23 0811 07/20/23 0614  WBC 5.7 5.8 5.8  HGB 11.1* 11.5* 11.5*  HCT 32.3* 34.6* 33.3*  MCV 93.4 94.3 94.1  PLT 113* 139* 137*   Basic Metabolic Panel: Recent Labs  Lab 07/18/23 0416 07/19/23 0408 07/20/23 0614 07/21/23 0909 07/22/23 0410  NA 137 137 134* 135 134*  K 3.7 3.4* 3.8 3.7 3.5  CL 97* 98 99 96* 97*  CO2 28 31 27 28 25   GLUCOSE 111* 110* 113* 115* 96  BUN 21 25* 18 30* 41*  CREATININE 2.86* 3.19* 2.56* 3.11* 3.05*  CALCIUM 9.3 9.1 9.1 9.5 9.2  PHOS  --   --   --  3.4 4.5   Liver Function Tests: Recent Labs  Lab 07/21/23 0909 07/22/23 0410  ALBUMIN 3.7 3.4*   CBG: Recent Labs  Lab 07/15/23 1447  GLUCAP 94    Discharge time spent: greater than 30 minutes.  Signed: Marrion Coy, MD [facility] 07/22/2023

## 2023-07-22 NOTE — TOC Transition Note (Signed)
 Transition of Care [facility]) - Discharge Note   Patient Details  Name: [name] MRN: [id number] Date of Birth: 11-08-1950  Transition of [facility]) CM/SW Contact:  Chapman Fitch, RN Phone Number: 07/22/2023, 1:42 PM   Clinical Narrative:      Patient to discharge today [place] with [facility] notified  At this time per nephrology patient will not require out patient HD and will follow up with them for lab work this week.   Patient does have an outpatient chair time should his repeat kidney function worsen  Sister Thurston Hole updated per patient request        Patient Goals and CMS Choice            Discharge Placement                       Discharge Plan and Services Additional resources added to the After Visit Summary for                                       Social Drivers of Health (SDOH) Interventions SDOH Screenings   Food Insecurity: No Food Insecurity (07/10/2023)  Housing: Low Risk  (07/10/2023)  Transportation Needs: No Transportation Needs (07/10/2023)  Utilities: Not At Risk (07/10/2023)  Alcohol Screen: Low Risk  (10/28/2022)  Depression (PHQ2-9): Low Risk  (06/25/2023)  Financial Resource Strain: Low Risk  (10/22/2022)  Physical Activity: Inactive (10/22/2022)  Social Connections: Socially Isolated (07/10/2023)  Stress: Stress Concern Present (01/29/2023)  Tobacco Use: Medium Risk (07/18/2023)     Readmission Risk Interventions     No data to display

## 2023-07-23 DIAGNOSIS — N179 Acute kidney failure, unspecified: Secondary | ICD-10-CM | POA: Diagnosis not present

## 2023-07-23 DIAGNOSIS — I10 Essential (primary) hypertension: Secondary | ICD-10-CM | POA: Diagnosis not present

## 2023-07-23 DIAGNOSIS — D649 Anemia, unspecified: Secondary | ICD-10-CM | POA: Diagnosis not present

## 2023-07-23 NOTE — Consult Note (Signed)
 [facility] Liaison Consult Note   07/23/2023  [name] Sep 25, 1950 [id number]  Primary Care Provider:  Charlcie Cradle, FNP [facility] [place])  Patient is currently active with Care Management for chronic disease management services.  Patient has been engaged by a community LCSW. Our community based plan of care has focused on disease management and community resource support.   Patient will receive a post hospital call and will be evaluated for assessments and disease process education.   Plan: Pt discharged home with [facility] through [facility]. Pt also received HD on TTS with [facility]. Liaison will collaborate with the [facility] LCSW concerning pt's discharge disposition for ongoing follow up.  Of note, Care Management services does not replace or interfere with any services that are needed or arranged by inpatient [facility] care management team.   For additional questions or referrals please contact:   Elliot Cousin, RN, BSN Hospital Liaison [place]   [facility], Population Health Office Hours MTWF  8:00 am-6:00 pm Direct Dial: [phone] mobile [email or web address]

## 2023-07-29 ENCOUNTER — Inpatient Hospital Stay: Admitting: Family Medicine

## 2023-08-04 ENCOUNTER — Other Ambulatory Visit: Payer: Self-pay | Admitting: Physician Assistant

## 2023-08-04 ENCOUNTER — Other Ambulatory Visit: Payer: Self-pay | Admitting: Family Medicine

## 2023-08-04 DIAGNOSIS — N2581 Secondary hyperparathyroidism of renal origin: Secondary | ICD-10-CM | POA: Diagnosis not present

## 2023-08-04 DIAGNOSIS — N179 Acute kidney failure, unspecified: Secondary | ICD-10-CM | POA: Diagnosis not present

## 2023-08-04 DIAGNOSIS — I152 Hypertension secondary to endocrine disorders: Secondary | ICD-10-CM

## 2023-08-04 DIAGNOSIS — K219 Gastro-esophageal reflux disease without esophagitis: Secondary | ICD-10-CM

## 2023-08-04 DIAGNOSIS — I10 Essential (primary) hypertension: Secondary | ICD-10-CM | POA: Diagnosis not present

## 2023-08-04 DIAGNOSIS — D649 Anemia, unspecified: Secondary | ICD-10-CM | POA: Diagnosis not present

## 2023-08-06 ENCOUNTER — Telehealth (INDEPENDENT_AMBULATORY_CARE_PROVIDER_SITE_OTHER): Payer: Self-pay

## 2023-08-06 NOTE — Telephone Encounter (Signed)
 Spoke with the patient and he is scheduled with Dr. Wyn Quaker for a permcath removal on 08/11/23 with a 2:15 pm arrival time. Pre-procedure instructions were discussed and will be sent to Mychart.

## 2023-08-11 ENCOUNTER — Ambulatory Visit
Admission: RE | Admit: 2023-08-11 | Discharge: 2023-08-11 | Disposition: A | Discharge status: Home / Self Care | Source: Ambulatory Visit | Attending: Vascular Surgery | Admitting: Vascular Surgery

## 2023-08-11 ENCOUNTER — Encounter
Admission: RE | Disposition: A | Discharge status: Home / Self Care | Payer: Self-pay | Source: Ambulatory Visit | Attending: Vascular Surgery

## 2023-08-11 DIAGNOSIS — I120 Hypertensive chronic kidney disease with stage 5 chronic kidney disease or end stage renal disease: Secondary | ICD-10-CM | POA: Diagnosis not present

## 2023-08-11 DIAGNOSIS — J449 Chronic obstructive pulmonary disease, unspecified: Secondary | ICD-10-CM | POA: Insufficient documentation

## 2023-08-11 DIAGNOSIS — E1122 Type 2 diabetes mellitus with diabetic chronic kidney disease: Secondary | ICD-10-CM | POA: Diagnosis not present

## 2023-08-11 DIAGNOSIS — N186 End stage renal disease: Secondary | ICD-10-CM | POA: Diagnosis not present

## 2023-08-11 DIAGNOSIS — G40909 Epilepsy, unspecified, not intractable, without status epilepticus: Secondary | ICD-10-CM | POA: Insufficient documentation

## 2023-08-11 DIAGNOSIS — J9610 Chronic respiratory failure, unspecified whether with hypoxia or hypercapnia: Secondary | ICD-10-CM | POA: Diagnosis not present

## 2023-08-11 DIAGNOSIS — E1142 Type 2 diabetes mellitus with diabetic polyneuropathy: Secondary | ICD-10-CM | POA: Diagnosis not present

## 2023-08-11 DIAGNOSIS — Z452 Encounter for adjustment and management of vascular access device: Secondary | ICD-10-CM | POA: Diagnosis not present

## 2023-08-11 DIAGNOSIS — F0390 Unspecified dementia without behavioral disturbance: Secondary | ICD-10-CM | POA: Diagnosis not present

## 2023-08-11 DIAGNOSIS — Z4901 Encounter for fitting and adjustment of extracorporeal dialysis catheter: Secondary | ICD-10-CM | POA: Diagnosis not present

## 2023-08-11 DIAGNOSIS — Z9981 Dependence on supplemental oxygen: Secondary | ICD-10-CM | POA: Diagnosis not present

## 2023-08-11 HISTORY — PX: DIALYSIS/PERMA CATHETER REMOVAL: CATH118289

## 2023-08-11 SURGERY — DIALYSIS/PERMA CATHETER REMOVAL
Anesthesia: LOCAL

## 2023-08-11 MED ORDER — LIDOCAINE-EPINEPHRINE (PF) 1 %-1:200000 IJ SOLN
INTRAMUSCULAR | Status: DC | PRN
  Administered 2023-08-11: 20 mL via INTRADERMAL

## 2023-08-11 SURGICAL SUPPLY — 3 items
CHLORAPREP W/TINT 26 (MISCELLANEOUS) IMPLANT
FORCEPS HALSTEAD CVD 5IN STRL (INSTRUMENTS) IMPLANT
TRAY LACERAT/PLASTIC (MISCELLANEOUS) IMPLANT

## 2023-08-11 NOTE — Op Note (Signed)
 Operative Note     Preoperative diagnosis:   1. Renal failure with return of renal function  Postoperative diagnosis:  1. same  Procedure:  Removal of right Permcath  Surgeon:  Festus Barren, MD  Anesthesia:  Local  EBL:  Minimal  Indication for the Procedure:  The patient has had return of his renal function and no longer needs their permcath.  This can be removed.  Risks and benefits are discussed and informed consent is obtained.  Description of the Procedure:  The patient's right neck, chest and existing catheter were sterilely prepped and draped. The area around the catheter was anesthetized copiously with 1% lidocaine. The catheter was dissected out with curved hemostats until the cuff was freed from the surrounding fibrous sheath. The fiber sheath was transected, and the catheter was then removed in its entirety using gentle traction. Pressure was held and sterile dressings were placed. The patient tolerated the procedure well and was taken to the recovery room in stable condition.     Festus Barren  08/11/2023, 4:50 PM This note was created with Dragon Medical transcription system. Any errors in dictation are purely unintentional.

## 2023-08-11 NOTE — Discharge Instructions (Signed)
Tunneled Catheter Removal, Care After Refer to this sheet in the next few weeks. These instructions provide you with information about caring for yourself after your procedure. Your health care provider may also give you more specific instructions. Your treatment has been planned according to current medical practices, but problems sometimes occur. Call your health care provider if you have any problems or questions after your procedure. What can I expect after the procedure? After the procedure, it is common to have: Some mild redness, swelling, and pain around your catheter site.   Follow these instructions at home: Incision care  Check your removal site  every day for signs of infection. Check for: More redness, swelling, or pain. More fluid or blood. Warmth. Pus or a bad smell. Remove your dressing in 48hrs leave open to air  Activity  Return to your normal activities as told by your health care provider. Ask your health care provider what activities are safe for you. Do not lift anything that is heavier than 10 lb (4.5 kg) for 3 days  You may shower tomorrow  Contact a health care provider if: You have more fluid or blood coming from your removal site You have more redness, swelling, or pain at your incisions or around the area where your catheter was removed Your removal site feel warm to the touch. You feel unusually weak. You feel nauseous.. Get help right away if You have swelling in your arm, shoulder, neck, or face. You develop chest pain. You have difficulty breathing. You feel dizzy or light-headed. You have pus or a bad smell coming from your removal site You have a fever. You develop bleeding from your removal site, and your bleeding does not stop. This information is not intended to replace advice given to you by your health care provider. Make sure you discuss any questions you have with your health care provider. Document Released: 04/15/2012 Document Revised:  12/31/2015 Document Reviewed: 01/23/2015 Elsevier Interactive Patient Education  2017 [employer]Tunneled Catheter Removal, Care After Refer to this sheet in the next few weeks. These instructions provide you with information about caring for yourself after your procedure. Your health care provider may also give you more specific instructions. Your treatment has been planned according to current medical practices, but problems sometimes occur. Call your health care provider if you have any problems or questions after your procedure. What can I expect after the procedure? After the procedure, it is common to have: Some mild redness, swelling, and pain around your catheter site.   Follow these instructions at home: Incision care  Check your removal site  every day for signs of infection. Check for: More redness, swelling, or pain. More fluid or blood. Warmth. Pus or a bad smell. Remove your dressing in 48hrs leave open to air  Activity  Return to your normal activities as told by your health care provider. Ask your health care provider what activities are safe for you. Do not lift anything that is heavier than 10 lb (4.5 kg) for 3 days  You may shower tomorrow  Contact a health care provider if: You have more fluid or blood coming from your removal site You have more redness, swelling, or pain at your incisions or around the area where your catheter was removed Your removal site feel warm to the touch. You feel unusually weak. You feel nauseous.. Get help right away if You have swelling in your arm, shoulder, neck, or face. You develop chest pain. You have difficulty breathing.  You feel dizzy or light-headed. You have pus or a bad smell coming from your removal site You have a fever. You develop bleeding from your removal site, and your bleeding does not stop. This information is not intended to replace advice given to you by your health care provider. Make sure you discuss any  questions you have with your health care provider. Document Released: 04/15/2012 Document Revised: 12/31/2015 Document Reviewed: 01/23/2015 Elsevier Interactive Patient Education  2017 [employer].

## 2023-08-11 NOTE — Interval H&P Note (Signed)
 History and Physical Interval Note:  08/11/2023 2:43 PM  [name]  has presented today for surgery, with the diagnosis of Perma Cath Removal   End Stage Renal.  The various methods of treatment have been discussed with the patient and family. After consideration of risks, benefits and other options for treatment, the patient has consented to  Procedure(s): DIALYSIS/PERMA CATHETER REMOVAL (N/A) as a surgical intervention.  The patient's history has been reviewed, patient examined, no change in status, stable for surgery.  I have reviewed the patient's chart and labs.  Questions were answered to the patient's satisfaction.     Festus Barren

## 2023-08-12 ENCOUNTER — Encounter: Payer: Self-pay | Admitting: Vascular Surgery

## 2023-08-20 DIAGNOSIS — C119 Malignant neoplasm of nasopharynx, unspecified: Secondary | ICD-10-CM | POA: Diagnosis not present

## 2023-08-20 DIAGNOSIS — K219 Gastro-esophageal reflux disease without esophagitis: Secondary | ICD-10-CM | POA: Diagnosis not present

## 2023-08-20 DIAGNOSIS — J441 Chronic obstructive pulmonary disease with (acute) exacerbation: Secondary | ICD-10-CM | POA: Diagnosis not present

## 2023-09-15 DIAGNOSIS — I10 Essential (primary) hypertension: Secondary | ICD-10-CM | POA: Diagnosis not present

## 2023-09-15 DIAGNOSIS — N2581 Secondary hyperparathyroidism of renal origin: Secondary | ICD-10-CM | POA: Diagnosis not present

## 2023-09-15 DIAGNOSIS — N179 Acute kidney failure, unspecified: Secondary | ICD-10-CM | POA: Diagnosis not present

## 2023-09-15 DIAGNOSIS — D649 Anemia, unspecified: Secondary | ICD-10-CM | POA: Diagnosis not present

## 2023-09-16 ENCOUNTER — Ambulatory Visit (INDEPENDENT_AMBULATORY_CARE_PROVIDER_SITE_OTHER): Admitting: Podiatry

## 2023-09-16 DIAGNOSIS — L853 Xerosis cutis: Secondary | ICD-10-CM

## 2023-09-16 MED ORDER — AMMONIUM LACTATE 12 % EX LOTN: 1.0000 | TOPICAL_LOTION | CUTANEOUS | 0 refills | Status: DC | PRN

## 2023-09-16 NOTE — Progress Notes (Signed)
 Subjective:  Patient ID: [name], male    DOB: Sep 18, 1950,  MRN: [id number]  Chief Complaint  Patient presents with   Foot Pain    Pt stated that he was told he had a bunion but he stated that it does not cause him any pain he stated that he has some peeling skin on his heels no itching or burning     73 y.o. male presents with the above complaint.  Patient presents with bilateral heel dry skin.  He states it has been present for quite some time is progressive gotten worse.  Is causing some peeling there is no itching associated with it.  Will need to discuss treatment options for this he does not have any pain.  He is tried over-the-counter options which has not helped   Review of Systems: Negative except as noted in the HPI. Denies N/V/F/Ch.  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    COPD (chronic obstructive pulmonary disease) (HCC)    Depression 08/19/2017   Hepatitis C    Hypertension    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HCC)    Seizures (HCC) 2010   Sleep apnea    Syncope    questionable vasovagal    Current Outpatient Medications:    amLODipine  (NORVASC ) 10 MG tablet, Take 10 mg by mouth daily., Disp: , Rfl:    ammonium lactate  (AMLACTIN DAILY) 12 % lotion, Apply 1 Application topically as needed., Disp: 400 g, Rfl: 0   acetaminophen  (TYLENOL ) 500 MG tablet, Take 2 tablets (1,000 mg total) by mouth every 6 (six) hours as needed for mild pain (pain score 1-3)., Disp: , Rfl:    amitriptyline  (ELAVIL ) 75 MG tablet, Take 1 tablet (75 mg total) by mouth at bedtime., Disp: 30 tablet, Rfl: 5   antiseptic oral rinse (BIOTENE) LIQD, 15 mLs by Mouth Rinse route as needed for dry mouth., Disp: , Rfl:    aspirin  EC 81 MG tablet, Take 81 mg by mouth daily. Swallow whole., Disp: , Rfl:    Blood Glucose Monitoring Suppl DEVI, 1  each by Does not apply route in the morning, at noon, and at bedtime. May substitute to any manufacturer covered by patient's insurance. Pt's current glucometer is broken., Disp: 1 each, Rfl: 0   cyanocobalamin  1000 MCG tablet, Take 1 tablet (1,000 mcg total) by mouth daily., Disp: 30 tablet, Rfl: 0   lisinopril  (ZESTRIL ) 10 MG tablet, TAKE ONE TABLET BY MOUTH ONCE DAILY, Disp: 90 tablet, Rfl: 1   nystatin  (MYCOSTATIN ) 100000 UNIT/ML suspension, Take 5 mLs by mouth daily as needed (thrush)., Disp: , Rfl:    omeprazole  (PRILOSEC) 40 MG capsule, TAKE 1 CAPSULE BY MOUTH ONCE DAILY, Disp: 90 capsule, Rfl: 1   rosuvastatin  (CRESTOR ) 40 MG tablet, Take 1 tablet (40 mg total) by mouth daily., Disp: 90 tablet, Rfl: 3 No current facility-administered medications for this visit.  Facility-Administered Medications Ordered in Other Visits:    heparin  lock flush 100 unit/mL, 500 Units, Intravenous, Once, Avonne Boettcher, MD  Social History   Tobacco Use  Smoking Status Former   Current packs/day: 0.00   Average packs/day: 0.5 packs/day for 50.0 years (25.0 ttl pk-yrs)   Types: Cigarettes   Start date: 04/04/1972   Quit date: 04/04/2022   Years since quitting: 1.4   Passive exposure:  Past  Smokeless Tobacco Never    Allergies  Allergen Reactions   Opana [Oxymorphone Hcl]     Made him BLACKOUT   Elemental Sulfur     Childhood reaction    Sulfa Antibiotics Other (See Comments)   Sulfa Antibiotics    Objective:  There were no vitals filed for this visit. There is no height or weight on file to calculate BMI. Constitutional Well developed. Well nourished.  Vascular Dorsalis pedis pulses palpable bilaterally. Posterior tibial pulses palpable bilaterally. Capillary refill normal to all digits.  No cyanosis or clubbing noted. Pedal hair growth normal.  Neurologic Normal speech. Oriented to person, place, and time. Epicritic sensation to light touch grossly present bilaterally.  Dermatologic  Bilateral heel xerosis with skin epidermolysis without subjective component of itching no open wounds or lesion noted  Orthopedic: Normal joint ROM without pain or crepitus bilaterally. No visible deformities. No bony tenderness.   Radiographs: None Assessment:   1. Xerosis of skin    Plan:  Patient was evaluated and treated and all questions answered.  Bilateral xerosis -I explained to the patient the etiology of xerosis and various treatment options were extensively discussed.  I explained to the patient the importance of maintaining moisturization of the skin with application of over-the-counter lotion such as Eucerin or Luciderm given that patient has failed over-the-counter options patient would benefit from ammonium lactate  ammonium lactate  was sent to the pharmacy I have asked him to apply twice a day he states understanding will do so immediately   No follow-ups on file.

## 2023-09-19 DIAGNOSIS — K219 Gastro-esophageal reflux disease without esophagitis: Secondary | ICD-10-CM | POA: Diagnosis not present

## 2023-09-19 DIAGNOSIS — J441 Chronic obstructive pulmonary disease with (acute) exacerbation: Secondary | ICD-10-CM | POA: Diagnosis not present

## 2023-09-19 DIAGNOSIS — C119 Malignant neoplasm of nasopharynx, unspecified: Secondary | ICD-10-CM | POA: Diagnosis not present

## 2023-09-29 ENCOUNTER — Ambulatory Visit: Payer: Self-pay

## 2023-09-29 NOTE — Telephone Encounter (Signed)
 Chief Complaint: frequent falls  Symptoms: falls, wounds to elbows, foot pain  Frequency: for 2 wks  Pertinent Negatives: Patient denies one-sided weakness, H/A, N/V, visual changes, difficulty concentrating, unsteady gait Disposition: [] ED /[] Urgent Care (no appt availability in office) / [x] Appointment(In office/virtual)/ []  [facility]/ [] Home Care/ [x] Refused Recommended Disposition /[] [place] [facility]/ []  Follow-up with PCP Additional Notes: Pt reports frequent falls over the last several weeks. Pt's last fall was 2 wks ago. He states he was standing at the toilet using the bathroom when he fell into the shower and landed on his shoulder and his head. Pt was not seen by a HCP after that fall. Pt states when he falls it feels like his legs are being "pushed out from under him." Pt denies CP, SOB, palpitations, LOC, dizziness, N/V, bleeding, diaphoresis. Pt states aside from episodes of falling his gait is steady. Pt endorses foot pain d/t neuropathy, states he can't take gabapentin  anymore. Pt does not think neuropathy and falls are related. After hitting his head in the shower 2 wks ago pt denies N/V, H/A, memory loss, difficulty concentrating, light sensitivity. States otherwise he feels normal. Denies fever. Pt endorses wounds to his elbows in various stages of healing from recent falls, states he will often use his elbows to catch himself. RN advised pt he should be seen within 4 hours for his symptoms. Pt states he is unable to be seen today as today is his birthday and he has plans. RN advised pt RN would relay symptoms to the office for follow-up. Pt thinks he might need a referral to a neurologist. RN advised the pt if he develops one-sided weakness, dizziness, unsteady gait, vomiting, H/A, hits his head, or experiences CP or SOB he should call 911. Pt verbalized understanding.   Copied from CRM [id number]. Topic: Clinical - Red Word Triage >> Sep 29, 2023 10:03 AM [name]  wrote: Red Word that prompted transfer to Nurse Triage: Patient has been having issues with dizziness and falling. Patient states that he fell on Friday he was using the restroom and and he just fell into the shower. Patient has reported wounding his elbows. Reason for Disposition  [1] MODERATE weakness (i.e., interferes with work, school, normal activities) AND [2] new-onset or worsening  Answer Assessment - Initial Assessment Questions 1. MECHANISM: "How did the fall happen?"     "My legs go right out from under me", "last time I fell was 2 wks ago when I was standing using the bathroom and I fell in the shower", "legs were dragging", endorses head strike in the shower. Endorses frequent falls over the last several months - unable to count the # of times he has fallen overall, but states he has not fallen in 2 wks. 2. DOMESTIC VIOLENCE AND ELDER ABUSE SCREENING: "Did you fall because someone pushed you or tried to hurt you?" If Yes, ask: "Are you safe now?"     No  3. ONSET: "When did the fall happen?" (e.g., minutes, hours, or days ago)     Last fall was 2 wks ago  4. LOCATION: "What part of the body hit the ground?" (e.g., back, buttocks, head, hips, knees, hands, head, stomach)     When pt fell in the shower, his head hit the wall, his shoulder hit the base 5. INJURY: "Did you hurt (injure) yourself when you fell?" If Yes, ask: "What did you injure? Tell me more about this?" (e.g., body area; type of injury; pain  severity)"     Endorses bilateral wounds to elbows, L elbow injury was sustained "a couple days before I fell into the shower, I got up from the commode and fell into the door jam and got a gash in my elbow, the other elbow wounds are from breaking my fall with my elbows" 6. PAIN: "Is there any pain?" If Yes, ask: "How bad is the pain?" (e.g., Scale 1-10; or mild,  moderate, severe)   - NONE (0): No pain   - MILD (1-3): Doesn't interfere with normal activities    - MODERATE (4-7):  Interferes with normal activities or awakens from sleep    - SEVERE (8-10): Excruciating pain, unable to do any normal activities      "Normal aches and pains of a 73 yr old man"; "it feels like I pulled a muscle in my back, it's better today than it was yesterday" 7. SIZE: For cuts, bruises, or swelling, ask: "How large is it?" (e.g., inches or centimeters)      "One of the wounds to my elbow might have needed stitches but I wrapped it up and it's almost completely healed" 9. OTHER SYMPTOMS: "Do you have any other symptoms?" (e.g., dizziness, fever, weakness; new onset or worsening).      Denies dizziness but states he got lightheaded Friday when he was cleaning out his fridge. Hit his head "a couple weeks ago" when he fell into the shower. Denies headaches. Denies nausea/vomiting. Denies memory loss or difficulty concentrating. Denies visual problems. Endorses chemo-induced neuropathy, gabapentin , pain clinic. Had a nerve block performed. States he is able to walk normally aside from the episodes in which he falls. Endorses foot pain. Can't take gabapentin  anymore and was told to take Tylenol . Pt does not think falls are related to neuropathy. Denies one-sided weakness or numbness. "Blood pressure is good." Denies slurred or difficulty with finding his words. Denies CP or SOB 10. CAUSE: "What do you think caused the fall (or falling)?" (e.g., tripped, dizzy spell)       Not sure  Protocols used: Falls and [facility]

## 2023-09-30 ENCOUNTER — Encounter (INDEPENDENT_AMBULATORY_CARE_PROVIDER_SITE_OTHER): Payer: Self-pay

## 2023-09-30 NOTE — Telephone Encounter (Signed)
Needs ov for evaluation

## 2023-09-30 NOTE — Telephone Encounter (Signed)
 Attempted to call pt back 1st: no answer left message.  2nd attempt made to call patient phone rang twice, stopped ringing, could hear noise in background the call disconnected. Will route chart to PCP to notify office attempt made to reach out to pt and message left.

## 2023-10-03 NOTE — Telephone Encounter (Signed)
 Spoke with patient. Reports that he is better after he called 05/19 and has not fallen. Patient requested to be seen next week. Offered appointment for Friday May 30th at 1 pm.

## 2023-10-10 ENCOUNTER — Encounter: Payer: Self-pay | Admitting: Family Medicine

## 2023-10-10 ENCOUNTER — Ambulatory Visit
Admission: RE | Admit: 2023-10-10 | Discharge: 2023-10-10 | Disposition: A | Discharge status: Home / Self Care | Source: Ambulatory Visit | Attending: Family Medicine | Admitting: Family Medicine

## 2023-10-10 ENCOUNTER — Ambulatory Visit (INDEPENDENT_AMBULATORY_CARE_PROVIDER_SITE_OTHER): Admitting: Family Medicine

## 2023-10-10 ENCOUNTER — Ambulatory Visit
Admission: RE | Admit: 2023-10-10 | Discharge: 2023-10-10 | Disposition: A | Discharge status: Home / Self Care | Attending: Family Medicine | Admitting: Family Medicine

## 2023-10-10 VITALS — BP 128/67 | HR 73 | Resp 16 | Ht 69.0 in | Wt 201.8 lb

## 2023-10-10 DIAGNOSIS — I6523 Occlusion and stenosis of bilateral carotid arteries: Secondary | ICD-10-CM | POA: Diagnosis not present

## 2023-10-10 DIAGNOSIS — R55 Syncope and collapse: Secondary | ICD-10-CM

## 2023-10-10 DIAGNOSIS — M545 Low back pain, unspecified: Secondary | ICD-10-CM | POA: Diagnosis not present

## 2023-10-10 DIAGNOSIS — R29898 Other symptoms and signs involving the musculoskeletal system: Secondary | ICD-10-CM | POA: Diagnosis not present

## 2023-10-10 DIAGNOSIS — M47816 Spondylosis without myelopathy or radiculopathy, lumbar region: Secondary | ICD-10-CM | POA: Diagnosis not present

## 2023-10-10 DIAGNOSIS — I700 Atherosclerosis of aorta: Secondary | ICD-10-CM | POA: Diagnosis not present

## 2023-10-10 NOTE — Progress Notes (Signed)
 Established patient visit   Patient: [name]   DOB: 1951-02-17   73 y.o. Male  MRN: [id number] Visit Date: 10/10/2023  Today's healthcare provider: Jeralene Mom, MD   Chief Complaint  Patient presents with   Fall    Legs feel weak and causes his falls   Referral    To Neurology   Subjective    Discussed the use of AI scribe software for clinical note transcription with the patient, who gave verbal consent to proceed.  History of Present Illness   [name] "[name] is a 73 year old male with kidney failure and chemotherapy-induced neuropathy who presents with recurrent falls.  He has been experiencing recurrent falls since February, with the most recent fall occurring a week ago. The falls are sudden, without any preceding dizziness or blackout, and he describes his legs as 'giving out'. He recalls an incident where he fell while reaching for a drink from the refrigerator, ending up in the hallway. During a recent hospital stay for kidney failure, he reported numerous falls, but no specific evaluation for the falls was conducted.  He has a history of kidney failure, for which he was hospitalized for two weeks but did not require dialysis. He also has a history of cancer treated in 2020-2021, resulting in chemotherapy-induced neuropathy. He is under the care of a pain doctor and was previously on 800 mg of gabapentin , which was discontinued. He currently uses a buprenorphine  patch weekly for pain management. He reports significant pain in his lower left back and feet, which he manages with a heating pad and by alternating between sleeping in a recliner and his bed.  No symptoms of lightheadedness, dizziness, shortness of breath, or palpitations. He monitors his blood pressure at home, noting it tends to run high, around 170/140. He recalls a 2022 ultrasound showing some blockage in the right carotid artery, but he has not had it rechecked recently.  He  has a family history of strokes, with his father having died from strokes at the age of 71. He expresses concern about potential mini-strokes or issues with his brain vessels.       Medications: Outpatient Medications Prior to Visit  Medication Sig   acetaminophen  (TYLENOL ) 500 MG tablet Take 2 tablets (1,000 mg total) by mouth every 6 (six) hours as needed for mild pain (pain score 1-3).   amitriptyline  (ELAVIL ) 75 MG tablet Take 1 tablet (75 mg total) by mouth at bedtime.   amLODipine  (NORVASC ) 10 MG tablet Take 10 mg by mouth daily.   ammonium lactate  (AMLACTIN DAILY) 12 % lotion Apply 1 Application topically as needed.   antiseptic oral rinse (BIOTENE) LIQD 15 mLs by Mouth Rinse route as needed for dry mouth.   aspirin  EC 81 MG tablet Take 81 mg by mouth daily. Swallow whole.   Blood Glucose Monitoring Suppl DEVI 1 each by Does not apply route in the morning, at noon, and at bedtime. May substitute to any manufacturer covered by patient's insurance. Pt's current glucometer is broken.   cyanocobalamin  1000 MCG tablet Take 1 tablet (1,000 mcg total) by mouth daily.   lisinopril  (ZESTRIL ) 10 MG tablet TAKE ONE TABLET BY MOUTH ONCE DAILY   nystatin  (MYCOSTATIN ) 100000 UNIT/ML suspension Take 5 mLs by mouth daily as needed (thrush).   omeprazole  (PRILOSEC) 40 MG capsule TAKE 1 CAPSULE BY MOUTH ONCE DAILY   rosuvastatin  (CRESTOR ) 40 MG tablet Take 1 tablet (40 mg total) by  mouth daily.   Facility-Administered Medications Prior to Visit  Medication Dose Route Frequency Provider   heparin  lock flush 100 unit/mL  500 Units Intravenous Once Rao, Archana C, MD   Review of Systems  Constitutional:  Negative for appetite change, chills and fever.  Respiratory:  Negative for chest tightness, shortness of breath and wheezing.   Cardiovascular:  Negative for chest pain and palpitations.  Gastrointestinal:  Negative for abdominal pain, nausea and vomiting.       Objective    BP 128/67 (BP  Location: Right Arm, Patient Position: Sitting, Cuff Size: Normal)   Pulse 73   Resp 16   Ht 5\' 9"  (1.753 m)   Wt 201 lb 12.8 oz (91.5 kg)   SpO2 96%   BMI 29.80 kg/m   Physical Exam   General: Appearance:    Well developed, well nourished male in no acute distress  Eyes:    PERRL, conjunctiva/corneas clear, EOM's intact       Lungs:     Clear to auscultation bilaterally, respirations unlabored  Heart:    Normal heart rate. Normal rhythm. No murmurs, rubs, or gallops.    MS:   All extremities are intact.    Neurologic:   Awake, alert, oriented x 3. No apparent focal neurological defect.          Assessment & Plan        Back pain, unspecified Chronic lower back pain, possibly neuropathic, affecting sleep and leg strength. No recent imaging since 2021-2022. - Order x-ray of the back at outpatient imaging center.  Chemotherapy-induced peripheral neuropathy Neuropathy from past chemotherapy with persistent pain. Managed with buprenorphine  patch. Past cannabis use complicates treatment options.  Chronic kidney disease, unspecified History of kidney failure, currently stable. No dialysis needed. Nephrologist follow-up in four months.  Hypertension, unspecified Blood pressure elevated at 170/140 mmHg. No recent management changes.  Cancer Cancer treated in 2020-2021. Under surveillance with oncology follow-up in July.  Carotid artery disease Possible carotid blockage from 2022 ultrasound. Concerns about circulation and mini-strokes. Family history of strokes. - Order ultrasound of carotid arteries. - Refer to neurologist for further evaluation.  Follow-up Plans for imaging and specialist consultations discussed. - Schedule x-ray of the back. - Schedule ultrasound of carotid arteries. - Schedule appointment with neurologist.    No follow-ups on file.   Anthony Mom, MD  [facility] [phone] (phone) [phone] (fax)  [facility]

## 2023-10-13 ENCOUNTER — Telehealth: Payer: Self-pay | Admitting: Family Medicine

## 2023-10-13 NOTE — Telephone Encounter (Signed)
 Patient stopped in asking for you to call in some type of muscle relaxer to be sent to [facility].

## 2023-10-14 MED ORDER — METHOCARBAMOL 500 MG PO TABS: 500.0000 mg | ORAL_TABLET | Freq: Three times a day (TID) | ORAL | 1 refills | Status: DC | PRN

## 2023-10-14 NOTE — Telephone Encounter (Signed)
 Spoke with patient and made aware. Verbalized understanding.

## 2023-10-14 NOTE — Addendum Note (Signed)
 Addended by: Lamon Pillow on: 10/14/2023 09:18 AM   Modules accepted: Orders

## 2023-10-14 NOTE — Telephone Encounter (Signed)
 Prescription robaxin  sent to [facility]

## 2023-10-16 ENCOUNTER — Ambulatory Visit
Admission: RE | Admit: 2023-10-16 | Discharge: 2023-10-16 | Disposition: A | Discharge status: Home / Self Care | Source: Ambulatory Visit | Attending: Family Medicine | Admitting: Family Medicine

## 2023-10-16 DIAGNOSIS — I6523 Occlusion and stenosis of bilateral carotid arteries: Secondary | ICD-10-CM | POA: Insufficient documentation

## 2023-10-17 ENCOUNTER — Ambulatory Visit: Payer: Self-pay | Admitting: Family Medicine

## 2023-10-17 DIAGNOSIS — I6523 Occlusion and stenosis of bilateral carotid arteries: Secondary | ICD-10-CM | POA: Insufficient documentation

## 2023-10-18 ENCOUNTER — Other Ambulatory Visit: Payer: Self-pay | Admitting: Physician Assistant

## 2023-10-18 DIAGNOSIS — K219 Gastro-esophageal reflux disease without esophagitis: Secondary | ICD-10-CM

## 2023-10-26 NOTE — Progress Notes (Signed)
 MRN : [id number]  [name] is a 73 y.o. (1951-03-04) male who presents with chief complaint of check carotid arteries.  History of Present Illness:   The patient is seen for evaluation of carotid stenosis. The carotid stenosis was identified after duplex ultrasound was obtained October 16, 2023.  This study is reviewed by me and demonstrates moderate 50 to 69% stenosis of the right internal carotid artery and 40 to 59% stenosis of the left internal carotid artery  The patient denies amaurosis fugax. There is no recent history of TIA symptoms or focal motor deficits. There is no prior documented CVA.  There is no history of migraine headaches. There is no history of seizures.  The patient is taking enteric-coated aspirin  81 mg daily.  No recent shortening of the patient's walking distance or new symptoms consistent with claudication.  No history of rest pain symptoms. No new ulcers or wounds of the lower extremities have occurred.  There is no history of DVT, PE or superficial thrombophlebitis. No recent episodes of angina or shortness of breath documented.   No outpatient medications have been marked as taking for the 10/27/23 encounter (Appointment) with Jama, Cordella MATSU, MD.    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    COPD (chronic obstructive pulmonary disease) (HCC)    Depression 08/19/2017   Hepatitis C    Hypertension    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HCC)    Seizures (HCC) 2010   Sleep apnea    Syncope    questionable vasovagal    Past Surgical History:  Procedure Laterality Date   COLONOSCOPY WITH PROPOFOL  N/A 12/16/2016   Procedure: COLONOSCOPY WITH PROPOFOL ;  Surgeon: Gaylyn Gladis PENNER, MD;  Location: [facility] ENDOSCOPY;  Service: Endoscopy;  Laterality: N/A;   COLONOSCOPY WITH  PROPOFOL  N/A 03/14/2017   Procedure: COLONOSCOPY WITH PROPOFOL ;  Surgeon: Gaylyn Gladis PENNER, MD;  Location: [facility] ENDOSCOPY;  Service: Endoscopy;  Laterality: N/A;   COLONOSCOPY WITH PROPOFOL      DIALYSIS/PERMA CATHETER INSERTION N/A 07/18/2023   Procedure: DIALYSIS/PERMA CATHETER INSERTION;  Surgeon: Marea Selinda RAMAN, MD;  Location: [facility] INVASIVE CV LAB;  Service: Cardiovascular;  Laterality: N/A;   DIALYSIS/PERMA CATHETER REMOVAL N/A 08/11/2023   Procedure: DIALYSIS/PERMA CATHETER REMOVAL;  Surgeon: Marea Selinda RAMAN, MD;  Location: [facility] INVASIVE CV LAB;  Service: Cardiovascular;  Laterality: N/A;   FOOT SURGERY Right 03/12/2002   FOOT SURGERY     HEMORRHOID SURGERY     LITHOTRIPSY     MYRINGOTOMY     MYRINGOTOMY WITH TUBE PLACEMENT Left 04/28/2019   Procedure: MYRINGOTOMY WITH TUBE PLACEMENT;  Surgeon: Milissa Hamming, MD;  Location: [facility];  Service: ENT;  Laterality: Left;   NASOPHARYNGOSCOPY N/A 04/28/2019   Procedure: NASOPHARYNGOSCOPY WITH BIOPSY;  Surgeon: Milissa Hamming, MD;  Location: [facility] SURGERY CNTR;  Service: ENT;  Laterality: N/A;   NASOPHARYNGOSCOPY     Port a cath Placement     PORTA CATH INSERTION N/A 05/20/2019   Procedure: PORTA CATH INSERTION;  Surgeon: Marea Selinda RAMAN, MD;  Location: [facility] INVASIVE CV LAB;  Service: Cardiovascular;  Laterality: N/A;   PORTA CATH REMOVAL N/A 11/29/2020   Procedure: PORTA CATH REMOVAL;  Surgeon:  Marea Selinda RAMAN, MD;  Location: [facility] INVASIVE CV LAB;  Service: Cardiovascular;  Laterality: N/A;   TEMPORARY DIALYSIS CATHETER N/A 07/11/2023   Procedure: TEMPORARY DIALYSIS CATHETER;  Surgeon: Jama Cordella MATSU, MD;  Location: [facility] INVASIVE CV LAB;  Service: Cardiovascular;  Laterality: N/A;   TONSILLECTOMY      Social History Social History   Tobacco Use   Smoking status: Former    Current packs/day: 0.00    Average packs/day: 0.5 packs/day for 50.0 years (25.0 ttl pk-yrs)    Types: Cigarettes    Start date: 04/04/1972    Quit date:  04/04/2022    Years since quitting: 1.5    Passive exposure: Past   Smokeless tobacco: Never  Vaping Use   Vaping status: Never Used  Substance Use Topics   Alcohol use: Not Currently    Alcohol/week: 14.0 standard drinks of alcohol    Types: 14 Cans of beer per week   Drug use: Not Currently    Family History Family History  Problem Relation Age of Onset   Arthritis Mother    Hypertension Mother    Cancer Father    Kidney disease Father    Alcohol abuse Paternal Aunt    Depression Maternal Grandfather     Allergies  Allergen Reactions   Opana [Oxymorphone Hcl]     Made him BLACKOUT   Elemental Sulfur     Childhood reaction    Sulfa Antibiotics Other (See Comments)   Sulfa Antibiotics      REVIEW OF SYSTEMS (Negative unless checked)  Constitutional: [] Weight loss  [] Fever  [] Chills Cardiac: [] Chest pain   [] Chest pressure   [] Palpitations   [] Shortness of breath when laying flat   [] Shortness of breath with exertion. Vascular:  [x] Pain in legs with walking   [] Pain in legs at rest  [] History of DVT   [] Phlebitis   [] Swelling in legs   [] Varicose veins   [] Non-healing ulcers Pulmonary:   [] Uses home oxygen    [] Productive cough   [] Hemoptysis   [] Wheeze  [] COPD   [] Asthma Neurologic:  [] Dizziness   [] Seizures   [] History of stroke   [] History of TIA  [] Aphasia   [] Vissual changes   [] Weakness or numbness in arm   [] Weakness or numbness in leg Musculoskeletal:   [] Joint swelling   [] Joint pain   [] Low back pain Hematologic:  [] Easy bruising  [] Easy bleeding   [] Hypercoagulable state   [] Anemic Gastrointestinal:  [] Diarrhea   [] Vomiting  [] Gastroesophageal reflux/heartburn   [] Difficulty swallowing. Genitourinary:  [] Chronic kidney disease   [] Difficult urination  [] Frequent urination   [] Blood in urine Skin:  [] Rashes   [] Ulcers  Psychological:  [] History of anxiety   []  History of major depression.  Physical Examination  There were no vitals filed for this  visit. There is no height or weight on file to calculate BMI. Gen: WD/WN, NAD Head: [place]/AT, No temporalis wasting.  Ear/Nose/Throat: Hearing grossly intact, nares w/o erythema or drainage Eyes: PER, EOMI, sclera nonicteric.  Neck: Supple, no masses.  No bruit or JVD.  Pulmonary:  Good air movement, no audible wheezing, no use of accessory muscles.  Cardiac: RRR, normal S1, S2, no Murmurs. Vascular:  carotid bruit noted Vessel Right Left  Radial Palpable Palpable  Carotid  Palpable  Palpable  Subclav  Palpable Palpable  Gastrointestinal: soft, non-distended. No guarding/no peritoneal signs.  Musculoskeletal: M/S 5/5 throughout.  No visible deformity.  Neurologic: CN 2-12 intact. Pain and light touch intact in extremities.  Symmetrical.  Speech is fluent. Motor exam as listed above. Psychiatric: Judgment intact, Mood & affect appropriate for pt's clinical situation. Dermatologic: No rashes or ulcers noted.  No changes consistent with cellulitis.   CBC Lab Results  Component Value Date   WBC 5.8 07/20/2023   HGB 11.5 (L) 07/20/2023   HCT 33.3 (L) 07/20/2023   MCV 94.1 07/20/2023   PLT 137 (L) 07/20/2023    BMET    Component Value Date/Time   NA 134 (L) 07/22/2023 0410   NA 140 06/25/2023 1136   NA 139 12/31/2013 1207   K 3.5 07/22/2023 0410   K 3.8 12/31/2013 1207   CL 97 (L) 07/22/2023 0410   CL 101 12/31/2013 1207   CO2 25 07/22/2023 0410   CO2 28 12/31/2013 1207   GLUCOSE 96 07/22/2023 0410   GLUCOSE 134 (H) 12/31/2013 1207   BUN 41 (H) 07/22/2023 0410   BUN 16 06/25/2023 1136   BUN 8 12/31/2013 1207   CREATININE 3.05 (H) 07/22/2023 0410   CREATININE 1.03 12/31/2013 1207   CALCIUM  9.2 07/22/2023 0410   CALCIUM  8.8 12/31/2013 1207   GFRNONAA 21 (L) 07/22/2023 0410   GFRNONAA >60 12/31/2013 1207   GFRAA >60 12/11/2019 0531   GFRAA >60 12/31/2013 1207   CrCl cannot be calculated (Patient's most recent lab result is older than the maximum 21 days  allowed.).  COAG Lab Results  Component Value Date   INR 1.1 07/09/2023   INR 1.0 09/25/2022   INR 1.2 05/04/2022    Radiology DG Lumbar Spine Complete Result Date: 10/18/2023 CLINICAL DATA:  Low back pain and bilateral leg weakness and history of recent falls, initial encounter EXAM: LUMBAR SPINE - COMPLETE 4+ VIEW COMPARISON:  04/09/2013 FINDINGS: Five lumbar type vertebral bodies are well visualized. Vertebral body height is well maintained. Mild osteophytic changes are seen. Facet hypertrophic changes are noted as well. No pars defects are seen. No soft tissue abnormality is seen. Aortic calcifications are noted. IMPRESSION: Degenerative change without acute abnormality. Electronically Signed   By: Oneil Devonshire M.D.   On: 10/18/2023 20:48   US  Carotid Duplex Bilateral Result Date: 10/17/2023 CLINICAL DATA:  Carotid stenosis EXAM: BILATERAL CAROTID DUPLEX ULTRASOUND TECHNIQUE: Elnor scale imaging, color Doppler and duplex ultrasound were performed of bilateral carotid and vertebral arteries in the neck. COMPARISON:  MRA neck performed May 03, 2022 FINDINGS: Criteria: Quantification of carotid stenosis is based on velocity parameters that correlate the residual internal carotid diameter with NASCET-based stenosis levels, using the diameter of the distal internal carotid lumen as the denominator for stenosis measurement. The following velocity measurements were obtained: RIGHT ICA: 136 cm/sec CCA: 97 cm/sec SYSTOLIC ICA/CCA RATIO:  1.4 ECA: 176 cm/sec LEFT ICA: 119 cm/sec CCA: 154 cm/sec SYSTOLIC ICA/CCA RATIO:  0.8 ECA: 142 cm/sec RIGHT CAROTID ARTERY: Moderate atherosclerotic changes are present in the proximal right internal carotid artery. RIGHT VERTEBRAL ARTERY:  Antegrade LEFT CAROTID ARTERY: Mild atherosclerotic changes are present in the proximal segment of the left internal carotid artery. LEFT VERTEBRAL ARTERY:  Antegrade IMPRESSION: 1. Right carotid system: Moderate stenosis in the  proximal right internal carotid artery estimated between 50 and 69%. 2. Left carotid system: Mild stenosis in the proximal left internal carotid artery estimated at less than 50%. Electronically Signed   By: Maude Naegeli M.D.   On: 10/17/2023 11:42     Assessment/Plan 1. Essential hypertension (Primary) Continue antihypertensive medications as already ordered, these medications have been reviewed and there are no changes at  this time.  2. Bilateral carotid artery stenosis Recommend:  Given the patient's asymptomatic subcritical stenosis no further invasive testing or surgery at this time.  Duplex ultrasound shows 50% stenosis bilaterally.  Continue antiplatelet therapy as prescribed Continue management of CAD, HTN and Hyperlipidemia Healthy heart diet,  encouraged exercise at least 4 times per week  Follow up in 6 months with duplex ultrasound and physical exam  - VAS US  CAROTID; Future  3. Panlobular emphysema (HCC) Continue pulmonary medications and aerosols as already ordered, these medications have been reviewed and there are no changes at this time.   4. Hyperlipidemia associated with type 2 diabetes mellitus (HCC) Continue statin as ordered and reviewed, no changes at this time  5. Type 2 diabetes mellitus without complication, without long-term current use of insulin  (HCC) Continue hypoglycemic medications as already ordered, these medications have been reviewed and there are no changes at this time.  Hgb A1C to be monitored as already arranged by primary service    Cordella Shawl, MD  10/26/2023 4:20 PM

## 2023-10-27 ENCOUNTER — Encounter (INDEPENDENT_AMBULATORY_CARE_PROVIDER_SITE_OTHER): Payer: Self-pay | Admitting: Vascular Surgery

## 2023-10-27 ENCOUNTER — Ambulatory Visit (INDEPENDENT_AMBULATORY_CARE_PROVIDER_SITE_OTHER): Admitting: Vascular Surgery

## 2023-10-27 VITALS — BP 156/84 | HR 85 | Resp 18 | Ht 69.0 in | Wt 201.6 lb

## 2023-10-27 DIAGNOSIS — E1169 Type 2 diabetes mellitus with other specified complication: Secondary | ICD-10-CM | POA: Diagnosis not present

## 2023-10-27 DIAGNOSIS — J431 Panlobular emphysema: Secondary | ICD-10-CM

## 2023-10-27 DIAGNOSIS — I6523 Occlusion and stenosis of bilateral carotid arteries: Secondary | ICD-10-CM

## 2023-10-27 DIAGNOSIS — E785 Hyperlipidemia, unspecified: Secondary | ICD-10-CM | POA: Diagnosis not present

## 2023-10-27 DIAGNOSIS — E119 Type 2 diabetes mellitus without complications: Secondary | ICD-10-CM | POA: Diagnosis not present

## 2023-10-27 DIAGNOSIS — I10 Essential (primary) hypertension: Secondary | ICD-10-CM | POA: Diagnosis not present

## 2023-10-29 ENCOUNTER — Ambulatory Visit

## 2023-11-01 ENCOUNTER — Encounter (INDEPENDENT_AMBULATORY_CARE_PROVIDER_SITE_OTHER): Payer: Self-pay | Admitting: Vascular Surgery

## 2023-11-19 ENCOUNTER — Ambulatory Visit: Payer: Medicare HMO | Admitting: Oncology

## 2023-11-20 ENCOUNTER — Inpatient Hospital Stay: Attending: Nurse Practitioner | Admitting: Nurse Practitioner

## 2023-11-20 ENCOUNTER — Encounter: Payer: Self-pay | Admitting: Nurse Practitioner

## 2023-11-20 VITALS — BP 98/69 | HR 60 | Temp 97.3°F | Resp 18 | Ht 69.0 in | Wt 201.0 lb

## 2023-11-20 DIAGNOSIS — Z9221 Personal history of antineoplastic chemotherapy: Secondary | ICD-10-CM | POA: Diagnosis not present

## 2023-11-20 DIAGNOSIS — R682 Dry mouth, unspecified: Secondary | ICD-10-CM | POA: Insufficient documentation

## 2023-11-20 DIAGNOSIS — Z08 Encounter for follow-up examination after completed treatment for malignant neoplasm: Secondary | ICD-10-CM | POA: Diagnosis not present

## 2023-11-20 DIAGNOSIS — Z923 Personal history of irradiation: Secondary | ICD-10-CM | POA: Diagnosis not present

## 2023-11-20 DIAGNOSIS — Z7982 Long term (current) use of aspirin: Secondary | ICD-10-CM | POA: Insufficient documentation

## 2023-11-20 DIAGNOSIS — Z87891 Personal history of nicotine dependence: Secondary | ICD-10-CM | POA: Diagnosis not present

## 2023-11-20 DIAGNOSIS — Z8589 Personal history of malignant neoplasm of other organs and systems: Secondary | ICD-10-CM | POA: Diagnosis not present

## 2023-11-20 DIAGNOSIS — Z85818 Personal history of malignant neoplasm of other sites of lip, oral cavity, and pharynx: Secondary | ICD-10-CM | POA: Diagnosis not present

## 2023-11-20 NOTE — Progress Notes (Signed)
 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  Milissa for symptoms of ear pain and discharge.  Patient also noticed associated hearing loss and tinnitus.  Patient was noted to have erythema and edema in the left sphenopalatine fossa as well as an exophytic mass on NPL exam.  CT soft tissue of the neck showed asymmetric soft tissue within the left nasopharynx measuring 2.4 x 2 x 3.2 cm.  There is lateral bulging with encroachment upon the left parapharyngeal space without frank infiltration.  No extension into oropharynx or nasal cavity.  No skull base infiltration.  No appreciable mass within the larynx.  No pathologically enlarged cervical lymph nodes.  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  He was only able to receive 6 doses of weekly cisplatin  40 mg due to cytopenias and AKI.  He remains in remission  Interval history- [name] is a 73 y.o. male  with history of head and neck cancer who returns to clinic for surveillance. He has been following up with ENT and self reports negative exams. Denies difficulty swallowing. Weight is stable. Complains of dry mouth since treatment. 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   ECOG PS- 1 Pain scale- 0  Review of systems- Review of Systems  Constitutional:  Negative for chills, fever, malaise/fatigue and weight loss.  HENT:  Negative for congestion, ear discharge, ear pain, nosebleeds, sinus pain and sore throat.   Eyes:  Negative for blurred vision.  Respiratory:  Negative for cough, hemoptysis, sputum production, shortness of breath and wheezing.   Cardiovascular:  Negative for chest pain, palpitations, orthopnea and claudication.  Gastrointestinal:  Negative for abdominal pain, blood in stool, constipation, diarrhea, heartburn, melena, nausea and vomiting.  Genitourinary:  Negative for dysuria, flank pain, frequency, hematuria and urgency.  Musculoskeletal:  Negative for back pain, joint pain and myalgias.  Skin:  Negative for rash.  Neurological:  Negative for dizziness, tingling, focal weakness, seizures, weakness and headaches.  Endo/Heme/Allergies:  Does not bruise/bleed easily.  Psychiatric/Behavioral:  Negative for depression and suicidal ideas. The patient does not have insomnia.       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   Chronic  pain in right foot    COPD (chronic obstructive pulmonary disease) (HCC)    Depression 08/19/2017   Hepatitis C    Hypertension    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HCC)    Seizures (HCC) 2010   Sleep apnea    Syncope     questionable vasovagal     Past Surgical History:  Procedure Laterality Date   COLONOSCOPY WITH PROPOFOL  N/A 12/16/2016   Procedure: COLONOSCOPY WITH PROPOFOL ;  Surgeon: Gaylyn Gladis PENNER, MD;  Location: [facility] ENDOSCOPY;  Service: Endoscopy;  Laterality: N/A;   COLONOSCOPY WITH PROPOFOL  N/A 03/14/2017   Procedure: COLONOSCOPY WITH PROPOFOL ;  Surgeon: Gaylyn Gladis PENNER, MD;  Location: [facility] ENDOSCOPY;  Service: Endoscopy;  Laterality: N/A;   COLONOSCOPY WITH PROPOFOL      DIALYSIS/PERMA CATHETER INSERTION N/A 07/18/2023   Procedure: DIALYSIS/PERMA CATHETER INSERTION;  Surgeon: Marea Selinda RAMAN, MD;  Location: [facility] INVASIVE CV LAB;  Service: Cardiovascular;  Laterality: N/A;   DIALYSIS/PERMA CATHETER REMOVAL N/A 08/11/2023   Procedure: DIALYSIS/PERMA CATHETER REMOVAL;  Surgeon: Marea Selinda RAMAN, MD;  Location: [facility] INVASIVE CV LAB;  Service: Cardiovascular;  Laterality: N/A;   FOOT SURGERY Right 03/12/2002   FOOT SURGERY     HEMORRHOID SURGERY     LITHOTRIPSY     MYRINGOTOMY     MYRINGOTOMY WITH TUBE PLACEMENT Left 04/28/2019   Procedure: MYRINGOTOMY WITH TUBE PLACEMENT;  Surgeon: Milissa Hamming, MD;  Location: [facility]ERY [facility];  Service: ENT;  Laterality: Left;   NASOPHARYNGOSCOPY N/A 04/28/2019   Procedure: NASOPHARYNGOSCOPY WITH BIOPSY;  Surgeon: Milissa Hamming, MD;  Location: [facility] SURGERY [facility];  Service: ENT;  Laterality: N/A;   NASOPHARYNGOSCOPY     Port a cath Placement     PORTA CATH INSERTION N/A 05/20/2019   Procedure: PORTA CATH INSERTION;  Surgeon: Marea Selinda RAMAN, MD;  Location: [facility] INVASIVE CV LAB;  Service: Cardiovascular;  Laterality: N/A;   PORTA CATH REMOVAL N/A 11/29/2020   Procedure: PORTA CATH REMOVAL;  Surgeon: Marea Selinda RAMAN, MD;  Location: [facility] INVASIVE CV LAB;  Service: Cardiovascular;  Laterality: N/A;   TEMPORARY DIALYSIS CATHETER N/A 07/11/2023   Procedure: TEMPORARY DIALYSIS CATHETER;  Surgeon: Jama Cordella MATSU, MD;  Location: [facility] INVASIVE CV LAB;  Service:  Cardiovascular;  Laterality: N/A;   TONSILLECTOMY      Social History   Socioeconomic History   Marital status: Divorced    Spouse name: Not on file   Number of children: Not on file   Years of education: Not on file   Highest education level: Not on file  Occupational History   Not on file  Tobacco Use   Smoking status: Former    Current packs/day: 0.00    Average packs/day: 0.5 packs/day for 50.0 years (25.0 ttl pk-yrs)    Types: Cigarettes    Start date: 04/04/1972    Quit date: 04/04/2022    Years since quitting: 1.6    Passive exposure: Past   Smokeless tobacco: Never  Vaping Use   Vaping status: Never Used  Substance and Sexual Activity   Alcohol use: Not Currently    Alcohol/week: 14.0 standard drinks of alcohol    Types: 14 Cans of beer per week   Drug use: Not Currently   Sexual activity: Not Currently  Other Topics Concern   Not on file  Social History Narrative   ** Merged History Encounter **       Social Drivers of Health   Financial Resource Strain: Low Risk  (10/22/2022)   Overall Physicist, medical Strain (  CARDIA)    Difficulty of Paying Living Expenses: Not hard at all  Food Insecurity: No Food Insecurity (07/10/2023)   Hunger Vital Sign    Worried About Running Out of Food in the Last Year: Never true    Ran Out of Food in the Last Year: Never true  Transportation Needs: No Transportation Needs (07/10/2023)   PRAPARE - Administrator, Civil Service (Medical): No    Lack of Transportation (Non-Medical): No  Physical Activity: Inactive (10/22/2022)   Exercise Vital Sign    Days of Exercise per Week: 0 days    Minutes of Exercise per Session: 0 min  Stress: Stress Concern Present (01/29/2023)   [employer] of Occupational Health - Occupational Stress Questionnaire    Feeling of Stress : To some extent  Social Connections: Socially Isolated (07/10/2023)   Social Connection and Isolation Panel    Frequency of Communication with  Friends and Family: Three times a week    Frequency of Social Gatherings with Friends and Family: Once a week    Attends Religious Services: Never    Database administrator or Organizations: No    Attends Banker Meetings: Never    Marital Status: Divorced  Catering manager Violence: Not At Risk (07/10/2023)   Humiliation, Afraid, Rape, and Kick questionnaire    Fear of Current or Ex-Partner: No    Emotionally Abused: No    Physically Abused: No    Sexually Abused: No    Family History  Problem Relation Age of Onset   Arthritis Mother    Hypertension Mother    Cancer Father    Kidney disease Father    Alcohol abuse Paternal Aunt    Depression Maternal Grandfather      Current Outpatient Medications:    acetaminophen  (TYLENOL ) 500 MG tablet, Take 2 tablets (1,000 mg total) by mouth every 6 (six) hours as needed for mild pain (pain score 1-3)., Disp: , Rfl:    amitriptyline  (ELAVIL ) 75 MG tablet, Take 1 tablet (75 mg total) by mouth at bedtime., Disp: 30 tablet, Rfl: 5   amLODipine  (NORVASC ) 10 MG tablet, Take 10 mg by mouth daily., Disp: , Rfl:    ammonium lactate  (AMLACTIN DAILY) 12 % lotion, Apply 1 Application topically as needed., Disp: 400 g, Rfl: 0   antiseptic oral rinse (BIOTENE) LIQD, 15 mLs by Mouth Rinse route as needed for dry mouth., Disp: , Rfl:    aspirin  EC 81 MG tablet, Take 81 mg by mouth daily. Swallow whole., Disp: , Rfl:    cyanocobalamin  1000 MCG tablet, Take 1 tablet (1,000 mcg total) by mouth daily., Disp: 30 tablet, Rfl: 0   lisinopril  (ZESTRIL ) 10 MG tablet, TAKE ONE TABLET BY MOUTH ONCE DAILY, Disp: 90 tablet, Rfl: 1   methocarbamol  (ROBAXIN ) 500 MG tablet, Take 1-2 tablets (500-1,000 mg total) by mouth every 8 (eight) hours as needed for muscle spasms., Disp: 30 tablet, Rfl: 1   nystatin  (MYCOSTATIN ) 100000 UNIT/ML suspension, Take 5 mLs by mouth daily as needed (thrush)., Disp: , Rfl:    omeprazole  (PRILOSEC) 40 MG capsule, TAKE 1 CAPSULE BY  MOUTH ONCE DAILY, Disp: 90 capsule, Rfl: 1   rosuvastatin  (CRESTOR ) 40 MG tablet, Take 1 tablet (40 mg total) by mouth daily., Disp: 90 tablet, Rfl: 3 No current facility-administered medications for this visit.  Facility-Administered Medications Ordered in Other Visits:    heparin  lock flush 100 unit/mL, 500 Units, Intravenous, Once, Melanee Annah BROCKS, MD  Physical  exam:  Vitals:   11/20/23 1329  BP: 98/69  Pulse: 60  Resp: 18  Temp: (!) 97.3 F (36.3 C)  TempSrc: Tympanic  SpO2: 98%  Weight: 201 lb (91.2 kg)  Height: 5' 9 (1.753 m)   Physical Exam HENT:     Mouth/Throat:     Mouth: Mucous membranes are moist.     Pharynx: Oropharynx is clear.  Cardiovascular:     Rate and Rhythm: Normal rate and regular rhythm.     Heart sounds: Normal heart sounds.  Pulmonary:     Effort: Pulmonary effort is normal.     Breath sounds: Normal breath sounds.  Abdominal:     General: Bowel sounds are normal.     Palpations: Abdomen is soft.  Lymphadenopathy:     Comments: No palpable cervical adenopathy  Skin:    General: Skin is warm and dry.  Neurological:     Mental Status: He is alert and oriented to person, place, and time.         Latest Ref Rng & Units 07/22/2023    4:10 AM  CMP  Glucose 70 - 99 mg/dL 96   BUN 8 - 23 mg/dL 41   Creatinine 9.38 - 1.24 mg/dL 6.94   Sodium 864 - 854 mmol/L 134   Potassium 3.5 - 5.1 mmol/L 3.5   Chloride 98 - 111 mmol/L 97   CO2 22 - 32 mmol/L 25   Calcium  8.9 - 10.3 mg/dL 9.2       Latest Ref Rng & Units 07/20/2023    6:14 AM  CBC  WBC 4.0 - 10.5 K/uL 5.8   Hemoglobin 13.0 - 17.0 g/dL 88.4   Hematocrit 60.9 - 52.0 % 33.3   Platelets 150 - 400 K/uL 137     Assessment and plan- Patient is a 73 y.o. male   History of nasopharyngeal carcinoma - stage II T2 N0 M0 s/p concurrent chemoradiation and currently in remission. Clinically NED. He will continue to follow up with ENT once a year.  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 No response to  biotene- using a bottle a week. Trial cevelimine 3 times a day.   Disposition: Phone follow up for dry mouth in 4-6 weeks 1 year- Dr Melanee for surveillance   Visit Diagnosis 1. Encounter for follow-up surveillance of head and neck cancer   2. Dry mouth     Tinnie Dawn, DNP, AGNP-C, [facility] at [facility] [phone] (clinic) 11/20/2023

## 2023-11-20 NOTE — Progress Notes (Signed)
 Patient has been doing fine, no new questions or concerns for today.

## 2023-11-21 ENCOUNTER — Encounter: Payer: Self-pay | Admitting: Oncology

## 2023-11-21 MED ORDER — CEVIMELINE HCL 30 MG PO CAPS
30.0000 mg | ORAL_CAPSULE | Freq: Three times a day (TID) | ORAL | 1 refills | Status: AC
Start: 2023-11-21 — End: ?

## 2023-11-24 DIAGNOSIS — G620 Drug-induced polyneuropathy: Secondary | ICD-10-CM | POA: Diagnosis not present

## 2023-11-24 DIAGNOSIS — T451X5A Adverse effect of antineoplastic and immunosuppressive drugs, initial encounter: Secondary | ICD-10-CM | POA: Diagnosis not present

## 2023-11-24 LAB — HEMOGLOBIN A1C: Hemoglobin A1C: 5.9

## 2023-11-25 ENCOUNTER — Other Ambulatory Visit: Payer: Self-pay | Admitting: Neurology

## 2023-11-25 DIAGNOSIS — R296 Repeated falls: Secondary | ICD-10-CM

## 2023-11-25 DIAGNOSIS — R569 Unspecified convulsions: Secondary | ICD-10-CM

## 2023-11-25 DIAGNOSIS — R531 Weakness: Secondary | ICD-10-CM

## 2023-12-16 ENCOUNTER — Ambulatory Visit: Admission: RE | Admit: 2023-12-16 | Source: Ambulatory Visit

## 2024-01-05 DIAGNOSIS — R200 Anesthesia of skin: Secondary | ICD-10-CM | POA: Diagnosis not present

## 2024-01-05 DIAGNOSIS — R202 Paresthesia of skin: Secondary | ICD-10-CM | POA: Diagnosis not present

## 2024-01-19 ENCOUNTER — Other Ambulatory Visit: Payer: Self-pay | Admitting: Surgery

## 2024-01-19 DIAGNOSIS — I10 Essential (primary) hypertension: Secondary | ICD-10-CM | POA: Diagnosis not present

## 2024-01-19 DIAGNOSIS — D631 Anemia in chronic kidney disease: Secondary | ICD-10-CM | POA: Diagnosis not present

## 2024-01-19 DIAGNOSIS — N2581 Secondary hyperparathyroidism of renal origin: Secondary | ICD-10-CM | POA: Diagnosis not present

## 2024-01-19 DIAGNOSIS — N182 Chronic kidney disease, stage 2 (mild): Secondary | ICD-10-CM | POA: Diagnosis not present

## 2024-01-19 LAB — MICROALBUMIN, URINE: Microalb, Ur: 0.6

## 2024-01-19 LAB — BASIC METABOLIC PANEL WITH GFR
BUN: 12 (ref 4–21)
Chloride: 99 (ref 99–108)
Creatinine: 1.2 (ref 0.6–1.3)
Glucose: 101
Potassium: 4.5 meq/L (ref 3.5–5.1)
Sodium: 136 — AB (ref 137–147)

## 2024-01-19 LAB — COMPREHENSIVE METABOLIC PANEL WITH GFR
Albumin: 4.6 (ref 3.5–5.0)
Calcium: 9.1 (ref 8.7–10.7)
eGFR: 64

## 2024-01-19 LAB — MICROALBUMIN / CREATININE URINE RATIO: Microalb Creat Ratio: 6

## 2024-01-19 LAB — CBC: RBC: 4.23 (ref 3.87–5.11)

## 2024-01-19 LAB — CBC AND DIFFERENTIAL
HCT: 43 (ref 41–53)
Hemoglobin: 14.2 (ref 13.5–17.5)
Platelets: 160 K/uL (ref 150–400)
WBC: 6.6

## 2024-01-19 LAB — PROTEIN / CREATININE RATIO, URINE: Creatinine, Urine: 99

## 2024-01-23 ENCOUNTER — Ambulatory Visit: Payer: Self-pay

## 2024-01-23 NOTE — Telephone Encounter (Signed)
 FYI Only or Action Required?: FYI only for provider.  Patient was last seen in primary care on 10/10/2023 by Gasper Nancyann BRAVO, MD.  Called Nurse Triage reporting Foot Pain.  Symptoms began several weeks ago.  Interventions attempted: OTC medications: Tylenol .  Symptoms are: gradually worsening.  Triage Disposition: See PCP When Office is Open (Within 3 Days)  Patient/caregiver understands and will follow disposition?: Yes  **Appt. Scheduled for 9/15**           Copied from CRM #[id number]. Topic: Clinical - Red Word Triage >> Jan 23, 2024 11:40 AM Terri MATSU wrote: [facility] that prompted transfer to Nurse Triage: Patient is in extreme pain in foot/hands Reason for Disposition  [1] MODERATE pain (e.g., interferes with normal activities, limping) AND [2] present > 3 days  Answer Assessment - Initial Assessment Questions 1. ONSET: When did the pain start?       Ongoing since 2021, the last x 3 weeks pain has worsened.   2. LOCATION: Where is the pain located?      BIL hands and feet, foot pain is worse  3. PAIN: How bad is the pain?    (Scale 1-10; or mild, moderate, severe)    Pain is moderate  4. WORK OR EXERCISE: Has there been any recent work or exercise that involved this part of the body?      No   5. CAUSE: What do you think is causing the foot pain?     He suspects Chemotherapy; not currently on Chemo since 2021, but was told by providers that may be causing the pain.   6. OTHER SYMPTOMS: Do you have any other symptoms? (e.g., leg pain, rash, fever, numbness)  Numbness   He is taking Tylenol  for the pain. Pt. Has TOC appt. For 9/26; however he wishes to be seen sooner with any avail. Provider. Appt. Was scheduled for 9/15 as well. He will also keep TOC appt.  Protocols used: Foot Pain-A-AH

## 2024-01-26 ENCOUNTER — Ambulatory Visit: Admitting: Family Medicine

## 2024-01-26 ENCOUNTER — Encounter: Payer: Self-pay | Admitting: Family Medicine

## 2024-01-26 VITALS — BP 123/58 | HR 65 | Ht 69.0 in | Wt 204.5 lb

## 2024-01-26 DIAGNOSIS — G620 Drug-induced polyneuropathy: Secondary | ICD-10-CM | POA: Diagnosis not present

## 2024-01-26 DIAGNOSIS — Z59819 Housing instability, housed unspecified: Secondary | ICD-10-CM | POA: Diagnosis not present

## 2024-01-26 DIAGNOSIS — T451X5A Adverse effect of antineoplastic and immunosuppressive drugs, initial encounter: Secondary | ICD-10-CM

## 2024-01-26 DIAGNOSIS — G5773 Causalgia of bilateral lower limbs: Secondary | ICD-10-CM | POA: Diagnosis not present

## 2024-01-26 DIAGNOSIS — Z5941 Food insecurity: Secondary | ICD-10-CM

## 2024-01-26 DIAGNOSIS — F5102 Adjustment insomnia: Secondary | ICD-10-CM

## 2024-01-26 DIAGNOSIS — E1142 Type 2 diabetes mellitus with diabetic polyneuropathy: Secondary | ICD-10-CM

## 2024-01-26 MED ORDER — AMITRIPTYLINE HCL 75 MG PO TABS
75.0000 mg | ORAL_TABLET | Freq: Every day | ORAL | 1 refills | Status: DC
Start: 2024-01-26 — End: 2024-04-06

## 2024-01-26 MED ORDER — NYSTATIN 100000 UNIT/ML MT SUSP: 5.0000 mL | Freq: Three times a day (TID) | OROMUCOSAL | 0 refills | Status: DC | PRN

## 2024-01-26 MED ORDER — GABAPENTIN 300 MG PO CAPS: 600.0000 mg | ORAL_CAPSULE | Freq: Three times a day (TID) | ORAL | 1 refills | Status: DC

## 2024-01-26 NOTE — Progress Notes (Signed)
 Acute visit   Patient: [name]   DOB: [birth year]   73 y.o. Male  MRN: [id number] PCP: Wellington Curtis LABOR, FNP   Chief Complaint  Patient presents with   Acute Visit    Patient is present due to BIL hand and foot pain (foot pain worse) X 3 weeks. Reports pain present for years due to chemo induced neuropathy but reports it has worsened in the last 3 weeks. Reports moderate pain. He takes tylenol     Subjective    Discussed the use of AI scribe software for clinical note transcription with the patient, who gave verbal consent to proceed.  History of Present Illness   [name] is a 73 year old male with chemotherapy-induced neuropathy who presents with worsening pain in his hands and feet.  He experiences worsening pain in his hands and feet, attributed to chemotherapy-induced neuropathy. The reduction in gabapentin  from 600 mg three times a day to 300 mg twice a day has led to increased pain, especially at night, causing frequent awakenings and leg cramps. He uses a buprenorphine  patch and has had a nerve block for pain management. He was previously on Elavil  75 mg at bedtime for sleep, with a higher dose administered during a hospital stay.  He has a history of throat cancer treated with chemotherapy and radiation, resulting in significant throat damage and weight fluctuations. He currently weighs 12 pounds more than his lowest weight after losing 40 pounds through dieting.  He resides in a motel following eviction and is on a waiting list for an income-based apartment. His diet is primarily microwave-based, affecting his salt intake due to reliance on frozen dinners. His kidney function has improved significantly, previously requiring hospitalization for kidney issues.        Review of Systems  Objective    BP (!) 123/58 (BP Location: Left Arm, Patient Position: Sitting, Cuff Size: Normal)   Pulse 65   Ht 5' 9 (1.753 m)   Wt 204 lb 8 oz  (92.8 kg)   SpO2 98%   BMI 30.20 kg/m  Physical Exam Constitutional:      General: He is not in acute distress.    Appearance: Normal appearance. He is not diaphoretic.  HENT:     Head: Normocephalic.  Eyes:     Conjunctiva/sclera: Conjunctivae normal.  Cardiovascular:     Rate and Rhythm: Normal rate and regular rhythm.  Pulmonary:     Effort: Pulmonary effort is normal. No respiratory distress.  Musculoskeletal:        General: No swelling or deformity.  Neurological:     Mental Status: He is alert and oriented to person, place, and time. Mental status is at baseline.       No results found for any visits on 01/26/24.  Assessment & Plan     Problem List Items Addressed This Visit       Endocrine   Diabetic polyneuropathy associated with type 2 diabetes mellitus (HCC) - Primary (Chronic)   Relevant Medications   gabapentin  (NEURONTIN ) 300 MG capsule   amitriptyline  (ELAVIL ) 75 MG tablet     Nervous and Auditory   Complex regional pain syndrome of both lower extremities   Relevant Medications   gabapentin  (NEURONTIN ) 300 MG capsule   amitriptyline  (ELAVIL ) 75 MG tablet     Other   Adjustment insomnia   Other Visit Diagnoses       Chemotherapy-induced peripheral neuropathy (HCC)  Relevant Medications   gabapentin  (NEURONTIN ) 300 MG capsule   amitriptyline  (ELAVIL ) 75 MG tablet     Housing insecurity         Food insecurity               Chemotherapy-induced peripheral neuropathy with chronic pain Chronic pain in hands and feet due to chemotherapy-induced peripheral neuropathy, worsened after reduction in gabapentin  dosage. Previous treatment included buprenorphine  patch and nerve block. Current pain management with gabapentin  was effective at higher doses. - Prescribe gabapentin  600 mg three times a day, total 1800 mg daily, and send prescription to [facility].  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 Previously managed with Elavil  at bedtime. - Prescribe Elavil  75 mg at bedtime for sleep.  Osteoarthritis of the hands Pain in the joints of the hands, likely due to osteoarthritis. Gabapentin  may have been providing some relief for this pain as well.  Chronic kidney disease, improved renal function Chronic kidney disease with improved renal function, currently over 60%. Previous gabapentin  dosage reduction may have been due to low kidney function, but function has since improved.  Oropharyngeal cancer, status post chemotherapy and radiation Oropharyngeal cancer treated with chemotherapy and 36 sessions of radiation. Weight loss after treatment was managed through dietary changes. Currently using magic mouthwash for symptom management. - Prescribe magic mouthwash and send prescription to pharmacy.  Hypertension, controlled Hypertension is well-controlled with current blood pressure readings within normal range.       Offered VBCI referral for SDOH, declined.  Given food bag today  Meds ordered this encounter  Medications   gabapentin  (NEURONTIN ) 300 MG capsule    Sig: Take 2 capsules (600 mg total) by mouth 3 (three) times daily.    Dispense:  180 capsule    Refill:  1   amitriptyline  (ELAVIL ) 75 MG tablet    Sig: Take 1 tablet (75 mg total) by mouth at bedtime.    Dispense:  30 tablet    Refill:  1   magic mouthwash (nystatin , diphenhydrAMINE , alum & mag hydroxide) suspension mixture    Sig: Swish and spit 5 mLs 3 (three) times daily as needed for mouth pain.    Dispense:  240 mL    Refill:  0     Return in about 2 weeks (around 02/09/2024) for as scheduled, With Dr Donzella.      I personally spent a total of 36 minutes in the care of the patient today including preparing to see the patient, getting/reviewing separately obtained history, performing a medically appropriate exam/evaluation, counseling and educating, placing orders,  documenting clinical information in the EHR, independently interpreting results, and coordinating care.   Jon Eva, MD  [facility] [phone] (phone) [phone] (fax)  [facility]

## 2024-02-06 ENCOUNTER — Ambulatory Visit: Admitting: Family Medicine

## 2024-02-06 ENCOUNTER — Encounter: Payer: Self-pay | Admitting: Family Medicine

## 2024-02-06 VITALS — BP 104/50 | HR 81 | Resp 16 | Wt 207.0 lb

## 2024-02-06 DIAGNOSIS — F17200 Nicotine dependence, unspecified, uncomplicated: Secondary | ICD-10-CM

## 2024-02-06 DIAGNOSIS — D631 Anemia in chronic kidney disease: Secondary | ICD-10-CM

## 2024-02-06 DIAGNOSIS — T451X5D Adverse effect of antineoplastic and immunosuppressive drugs, subsequent encounter: Secondary | ICD-10-CM

## 2024-02-06 DIAGNOSIS — J431 Panlobular emphysema: Secondary | ICD-10-CM | POA: Diagnosis not present

## 2024-02-06 DIAGNOSIS — D7589 Other specified diseases of blood and blood-forming organs: Secondary | ICD-10-CM

## 2024-02-06 DIAGNOSIS — G620 Drug-induced polyneuropathy: Secondary | ICD-10-CM

## 2024-02-06 DIAGNOSIS — G40909 Epilepsy, unspecified, not intractable, without status epilepticus: Secondary | ICD-10-CM

## 2024-02-06 DIAGNOSIS — N182 Chronic kidney disease, stage 2 (mild): Secondary | ICD-10-CM

## 2024-02-06 DIAGNOSIS — S6991XA Unspecified injury of right wrist, hand and finger(s), initial encounter: Secondary | ICD-10-CM

## 2024-02-06 DIAGNOSIS — E782 Mixed hyperlipidemia: Secondary | ICD-10-CM

## 2024-02-06 DIAGNOSIS — N2581 Secondary hyperparathyroidism of renal origin: Secondary | ICD-10-CM | POA: Diagnosis not present

## 2024-02-06 DIAGNOSIS — Z716 Tobacco abuse counseling: Secondary | ICD-10-CM

## 2024-02-06 DIAGNOSIS — I10 Essential (primary) hypertension: Secondary | ICD-10-CM

## 2024-02-06 DIAGNOSIS — Y842 Radiological procedure and radiotherapy as the cause of abnormal reaction of the patient, or of later complication, without mention of misadventure at the time of the procedure: Secondary | ICD-10-CM

## 2024-02-06 DIAGNOSIS — Z789 Other specified health status: Secondary | ICD-10-CM

## 2024-02-06 NOTE — Assessment & Plan Note (Signed)
 Last evaluated for seizures during hospitalization for seizure episode in 2015.  Seizures determined to be secondary to benzodiazepine withdrawal.  Patient reports no seizures since that time.  He reports he was started on gabapentin  within the past 5 years for his peripheral neuropathy, not for seizures.  Available documentation supports this.

## 2024-02-06 NOTE — Progress Notes (Addendum)
 Established patient visit   Patient: [name]   DOB: August 28, 1950   73 y.o. Male  MRN: [id number] Visit Date: 02/06/2024  Today's healthcare provider: LAURAINE LOISE BUOY, DO   Chief Complaint  Patient presents with   Transitions Of Care    Patient declined flu vaccine. Eye exam not up to date.   Subjective    HPI [name] is a 73 year old male with chemotherapy-induced neuropathy who presents for management of chronic pain and neuropathy.  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 The pain is persistent, and previous treatments, including a buprenorphine  patch and a nerve block, were ineffective. He is not currently using the buprenorphine  patch. He is taking gabapentin , which was started after consulting a neurologist, and believes his recently increased dose is helping some.  He has a history of acute kidney failure, which occurred in February 2025, leading to a two-week hospitalization. During this time, his eGFR decreased to 5 but has since improved to 60. He attributes the kidney issues to prescribed pain medications and has since stopped taking all medications temporarily. He reported that during the week he stopped his medications, he did not fall.  He has a history of gallbladder issues, including an attack that he initially thought was a heart attack due to severe pain. He was diagnosed with an infected gallbladder and bronchitis during a hospital visit.  He has a history of a fall from a building in 2000, resulting in three screws in his foot. He continues to experience significant pain in this area.  He is currently taking several medications, including Norvasc , lisinopril , omeprazole , Crestor , Tylenol  as needed, and a baby aspirin . He also uses Magic Mouthwash and takes medication (cevimeline )  for dry mouth due to radiation treatment effects.  He is on Elavil  75 mcg nightly for sleep.  He recently resumed smoking due to stress from housing instability but states he has a more stable place to move to soon.  He reports a remote history of seizures, with the last episode occurring ten years ago.  He mentions a recent fall in his motel room, resulting in a possible finger injury, which he describes as painful and limiting his finger movement.  He has a family history of dementia, which his mother had, and he reports experiencing some memory issues himself. He is currently living in a motel but plans to move into a new apartment soon. No chest pain, shortness of breath, dizziness, or lightheadedness.      Medications: Outpatient Medications Prior to Visit  Medication Sig   acetaminophen  (TYLENOL ) 500 MG tablet Take 2 tablets (1,000 mg total) by mouth every 6 (six) hours as needed for mild pain (pain score 1-3).   amitriptyline  (ELAVIL ) 75 MG tablet Take 1 tablet (75 mg total) by mouth at bedtime.   amLODipine  (NORVASC ) 10 MG tablet Take 10 mg by mouth daily.   ammonium lactate  (AMLACTIN DAILY) 12 % lotion Apply 1 Application topically as needed.   antiseptic oral rinse (BIOTENE) LIQD 15 mLs by Mouth Rinse route as needed for dry mouth.   aspirin  EC 81 MG tablet Take 81 mg by mouth daily. Swallow whole.   cevimeline  (EVOXAC ) 30 MG capsule Take 1 capsule (30 mg total) by mouth 3 (three) times daily. For dry mouth   Cholecalciferol 50 MCG (2000 UT) TABS Take 2,000  Units by mouth.   cyanocobalamin  1000 MCG tablet Take 1 tablet (1,000 mcg total) by mouth daily.   gabapentin  (NEURONTIN ) 300 MG capsule Take 2 capsules (600 mg total) by mouth 3 (three) times daily.   lisinopril  (ZESTRIL ) 10 MG tablet TAKE ONE TABLET BY MOUTH ONCE DAILY   magic mouthwash (nystatin , diphenhydrAMINE , alum & mag hydroxide) suspension mixture Swish and spit 5 mLs 3 (three) times daily as needed for mouth pain.    nystatin  (MYCOSTATIN ) 100000 UNIT/ML suspension Take by mouth.   omeprazole  (PRILOSEC) 40 MG capsule TAKE 1 CAPSULE BY MOUTH ONCE DAILY   rosuvastatin  (CRESTOR ) 40 MG tablet Take 1 tablet (40 mg total) by mouth daily.   [DISCONTINUED] methocarbamol  (ROBAXIN ) 500 MG tablet Take 1-2 tablets (500-1,000 mg total) by mouth every 8 (eight) hours as needed for muscle spasms. (Patient not taking: Reported on 01/26/2024)   Facility-Administered Medications Prior to Visit  Medication Dose Route Frequency Provider   heparin  lock flush 100 unit/mL  500 Units Intravenous Once Rao, Archana C, MD    Review of Systems  Respiratory: Negative.  Negative for cough, shortness of breath and wheezing.   Cardiovascular:  Negative for chest pain, palpitations and leg swelling.  Neurological:  Negative for dizziness, weakness, light-headedness and headaches.        Objective    BP (!) 104/50 (BP Location: Left Arm, Patient Position: Sitting, Cuff Size: Normal)   Pulse 81   Resp 16   Wt 207 lb (93.9 kg)   SpO2 98%   BMI 30.57 kg/m     Physical Exam Vitals reviewed.  Constitutional:      General: He is not in acute distress.    Appearance: Normal appearance. He is not diaphoretic.  HENT:     Head: Normocephalic and atraumatic.  Eyes:     General: No scleral icterus.    Conjunctiva/sclera: Conjunctivae normal.  Cardiovascular:     Rate and Rhythm: Normal rate and regular rhythm.     Pulses: Normal pulses.     Heart sounds: Normal heart sounds. No murmur heard. Pulmonary:     Effort: Pulmonary effort is normal. No respiratory distress.     Breath sounds: Normal breath sounds. No wheezing or rhonchi.  Abdominal:     General: Bowel sounds are normal. There is no distension.     Palpations: Abdomen is soft. There is no mass.     Tenderness: There is no abdominal tenderness. There is no guarding.     Hernia: No hernia is present.  Musculoskeletal:     Cervical back: Neck supple.     Right lower  leg: No edema.     Left lower leg: No edema.  Lymphadenopathy:     Cervical: No cervical adenopathy.  Skin:    General: Skin is warm and dry.     Findings: No rash.  Neurological:     Mental Status: He is alert and oriented to person, place, and time. Mental status is at baseline.  Psychiatric:        Mood and Affect: Mood normal.        Behavior: Behavior normal.      No results found for any visits on 02/06/24.  Assessment & Plan    Transition of care  Finger injury, right, initial encounter  Chemotherapy-induced peripheral neuropathy  Hyperparathyroidism due to renal insufficiency  Anemia in stage 2 chronic kidney disease  Nicotine  dependence with current use -     Ambulatory Referral for Lung Cancer  Scre  Encounter for smoking cessation counseling -     Ambulatory Referral for Lung Cancer Scre  Essential hypertension  Macrocytosis  Panlobular emphysema (HCC)  Xerostomia due to radiotherapy  Seizure disorder [facility]) Assessment & Plan: Last evaluated for seizures during hospitalization for seizure episode in 2015.  Seizures determined to be secondary to benzodiazepine withdrawal.  Patient reports no seizures since that time.  He reports he was started on gabapentin  within the past 5 years for his peripheral neuropathy, not for seizures.  Available documentation supports this.   Mixed hyperlipidemia     Transition of care  Recent right finger injury (possible tendon injury) Recent right finger injury with limited motion and tenderness, possible tendon injury. - Recommend finger splint for immobilization. - Advise gentle exercises to prevent stiffness. - Consider x-ray if symptoms persist or worsen.  Chemotherapy-induced peripheral neuropathy Chronic neuropathy in feet and hands due to previous chemotherapy.  Persistent pain present with benefit from current gabapentin  treatment. - Continue gabapentin  600 mg 3 times daily as needed.  Anemia in stage 2  chronic kidney disease Improved kidney function status post post-acute kidney failure in February 2025, currently stable at stage 2.  Currently stable hemoglobin was level 14.2 on 01/19/2024. - Recent nephrology and neurology lab results to be abstracted.  Essential hypertension Chronic, stable.  Hypertension with Norvasc  and lisinopril .  No changes today. - Continue Norvasc  and lisinopril .  Mixed hyperlipidemia Chronic, stable.  Hyperlipidemia managed with Crestor .  No changes today. - Continue Crestor  40 mg nightly.  Prediabetes Prediabetes with dietary challenges, recent A1c 6.1 on 06/25/2023.  Previously diagnosed as type 2 diabetes; however, upon review of the medical record, the diagnosis does not appear to be clear-cut and a confirmatory A1c >= 6.5% never occurred, as all subsequent A1c's were <6.5%, despite no treatment with medication.  Patient has history of mildly elevated glucoses.  However, I am unable to ascertain which of these were definitely drawn fasting and none are above 200. - Review recent A1c results. - Encourage dietary modifications and increase physical activity as tolerated. - Continue to monitor.  Chronic pain, status post foot injury and surgery Chronic foot pain post-injury and surgery, persistent discomfort. - Continue Tylenol  as needed. - Schedule follow-up with podiatry.  Xerostomia due to to radiotherapy Chronic dry mouth post-radiation, managed with Biotene and cevimeline . - Continue Biotene and cevimeline .  Panlobular emphysema Panlobular emphysema with recent bronchitis episode without typical symptoms, no current respiratory symptoms. - Consider inhaler if recurrent bronchitis or symptoms develop.  Counseled patient on symptoms to be aware of.  Nicotine  dependence with current use; smoking cessation counseling Resumed tobacco use due to stress, previous quit attempts. - Encourage smoking cessation, discuss quitting strategies. - Referred for  low-dose CT lung cancer screening.  Macrocytosis Macrocytosis noted on recent blood work checked by nephrology 01/19/2024, with recent normal vitamin B12 and folate levels.  Plan to reassess at follow-up.    Return in about 3 months (around 05/07/2024) for Chronic f/u and mAWV with AWV nurse.      I discussed the assessment and treatment plan with the patient  The patient was provided an opportunity to ask questions and all were answered. The patient agreed with the plan and demonstrated an understanding of the instructions.   The patient was advised to call back or seek an in-person evaluation if the symptoms worsen or if the condition fails to improve as anticipated.  Total time was 40 minutes. That includes chart review before the  visit, the actual patient visit, and time spent on documentation after the visit.     LAURAINE LOISE BUOY, DO  [facility] [phone] (phone) [phone] (fax)  [facility]

## 2024-02-06 NOTE — Patient Instructions (Addendum)
 Call podiatry, Dr. Tobie, Franky SQUIBB, DPM, to schedule a follow-up appointment.   Recommended vaccines: Tdap (tetanus, diphtheria and pertussis) and Shingrix (shingles).  Please call and schedule your eye exam at [facility]. [address], [place], [place] [address] Phone: [phone]

## 2024-02-07 DIAGNOSIS — D7589 Other specified diseases of blood and blood-forming organs: Secondary | ICD-10-CM | POA: Insufficient documentation

## 2024-02-07 DIAGNOSIS — Y842 Radiological procedure and radiotherapy as the cause of abnormal reaction of the patient, or of later complication, without mention of misadventure at the time of the procedure: Secondary | ICD-10-CM | POA: Insufficient documentation

## 2024-02-19 ENCOUNTER — Telehealth: Payer: Self-pay | Admitting: Family Medicine

## 2024-02-19 ENCOUNTER — Other Ambulatory Visit: Payer: Self-pay

## 2024-02-19 MED ORDER — AMLODIPINE BESYLATE 10 MG PO TABS: 10.0000 mg | ORAL_TABLET | Freq: Every day | ORAL | 1 refills | Status: AC

## 2024-02-19 NOTE — Telephone Encounter (Signed)
[facility] faxed refill request for the following medications:  amLODipine (NORVASC) 10 MG tablet   Please advise.

## 2024-02-19 NOTE — Telephone Encounter (Signed)
 Converted into refill request.

## 2024-02-26 ENCOUNTER — Ambulatory Visit (INDEPENDENT_AMBULATORY_CARE_PROVIDER_SITE_OTHER): Admitting: Podiatry

## 2024-02-26 DIAGNOSIS — Q828 Other specified congenital malformations of skin: Secondary | ICD-10-CM

## 2024-02-26 NOTE — Progress Notes (Signed)
 Subjective:  Patient ID: [name], male    DOB: 22-May-1950,  MRN: [id number]  Chief Complaint  Patient presents with   Callouses    Pt stated that he has this place on the bottom of his foot he wanted to have it checked     73 y.o. male presents with the above complaint.  Patient presents with right submetatarsal 5 porokeratotic lesion with central nucleated core noted pain on palpation to the lesion.  He wants to have it debrided down denies any acute complaints.  He denies seeing anyone else prior to seeing me he does have some neuropathy.  He is not a diabetic   Review of Systems: Negative except as noted in the HPI. Denies N/V/F/Ch.  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    COPD (chronic obstructive pulmonary disease) (HCC)    Dementia without behavioral disturbance (HCC) 02/16/2023   Depression 08/19/2017   Hepatitis C    Hypertension    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HCC)    Seizure disorder (HCC) 11/29/2013   Seizures (HCC) 2010   Sleep apnea    Syncope    questionable vasovagal    Current Outpatient Medications:    acetaminophen  (TYLENOL ) 500 MG tablet, Take 2 tablets (1,000 mg total) by mouth every 6 (six) hours as needed for mild pain (pain score 1-3)., Disp: , Rfl:    amitriptyline  (ELAVIL ) 75 MG tablet, Take 1 tablet (75 mg total) by mouth at bedtime., Disp: 30 tablet, Rfl: 1   amLODipine  (NORVASC ) 10 MG tablet, Take 1 tablet (10 mg total) by mouth daily., Disp: 90 tablet, Rfl: 1   ammonium lactate  (AMLACTIN DAILY) 12 % lotion, Apply 1 Application topically as needed., Disp: 400 g, Rfl: 0   antiseptic oral rinse (BIOTENE) LIQD, 15 mLs by Mouth Rinse route as needed for dry mouth., Disp: , Rfl:    aspirin  EC 81 MG tablet, Take 81 mg by mouth daily. Swallow whole., Disp: , Rfl:    cevimeline  (EVOXAC ) 30 MG  capsule, Take 1 capsule (30 mg total) by mouth 3 (three) times daily. For dry mouth, Disp: 90 capsule, Rfl: 1   Cholecalciferol 50 MCG (2000 UT) TABS, Take 2,000 Units by mouth., Disp: , Rfl:    cyanocobalamin  1000 MCG tablet, Take 1 tablet (1,000 mcg total) by mouth daily., Disp: 30 tablet, Rfl: 0   gabapentin  (NEURONTIN ) 300 MG capsule, Take 2 capsules (600 mg total) by mouth 3 (three) times daily., Disp: 180 capsule, Rfl: 1   lisinopril  (ZESTRIL ) 10 MG tablet, TAKE ONE TABLET BY MOUTH ONCE DAILY, Disp: 90 tablet, Rfl: 1   magic mouthwash (nystatin , diphenhydrAMINE , alum & mag hydroxide) suspension mixture, Swish and spit 5 mLs 3 (three) times daily as needed for mouth pain., Disp: 240 mL, Rfl: 0   nystatin  (MYCOSTATIN ) 100000 UNIT/ML suspension, Take by mouth., Disp: , Rfl:    omeprazole  (PRILOSEC) 40 MG capsule, TAKE 1 CAPSULE BY MOUTH ONCE DAILY, Disp: 90 capsule, Rfl: 1   rosuvastatin  (CRESTOR ) 40 MG tablet, Take 1 tablet (40 mg total) by mouth daily., Disp: 90 tablet, Rfl: 3 No current facility-administered medications for this visit.  Facility-Administered Medications Ordered in Other Visits:    heparin  lock flush 100 unit/mL, 500 Units, Intravenous, Once, Melanee Annah BROCKS, MD  Social History   Tobacco Use  Smoking Status Every Day   Current packs/day: 0.50   Average packs/day: 0.5 packs/day for 50.2 years (25.1 ttl pk-yrs)   Types: Cigarettes   Start date: 04/04/1972   Last attempt to quit: 04/04/2022   Passive exposure: Past  Smokeless Tobacco Never    Allergies  Allergen Reactions   Opana [Oxymorphone Hcl]     Made him BLACKOUT   Elemental Sulfur     Childhood reaction    Sulfa Antibiotics Other (See Comments)   Sulfa Antibiotics    Objective:  There were no vitals filed for this visit. There is no height or weight on file to calculate BMI. Constitutional Well developed. Well nourished.  Vascular Dorsalis pedis pulses palpable bilaterally. Posterior tibial pulses  palpable bilaterally. Capillary refill normal to all digits.  No cyanosis or clubbing noted. Pedal hair growth normal.  Neurologic Normal speech. Oriented to person, place, and time. Epicritic sensation to light touch grossly present bilaterally.  Dermatologic Right submetatarsal 5 porokeratotic lesion with central nucleated core noted pain on palpation to the lesion no open wounds or lesion noted no pinpoint bleeding noted  Orthopedic: Normal joint ROM without pain or crepitus bilaterally. No visible deformities. No bony tenderness.   Radiographs: None Assessment:   1. Porokeratosis    Plan:  Patient was evaluated and treated and all questions answered.  Right submetatarsal 5 porokeratosis - All questions and concerns were discussed with the patient extensive detail given the amount of porokeratotic lesion that is present patient will benefit from aggressive debridement of the lesion using chisel blade into the lesion was debride down to healthy stripe tissue as a courtesy no pinpoint bleeding noted.  No complication noted. - Care modification discussed  No follow-ups on file.

## 2024-03-01 ENCOUNTER — Encounter: Payer: Self-pay | Admitting: Emergency Medicine

## 2024-03-30 DIAGNOSIS — G620 Drug-induced polyneuropathy: Secondary | ICD-10-CM | POA: Diagnosis not present

## 2024-03-30 DIAGNOSIS — R413 Other amnesia: Secondary | ICD-10-CM | POA: Diagnosis not present

## 2024-03-30 DIAGNOSIS — Z23 Encounter for immunization: Secondary | ICD-10-CM | POA: Diagnosis not present

## 2024-03-30 DIAGNOSIS — T451X5A Adverse effect of antineoplastic and immunosuppressive drugs, initial encounter: Secondary | ICD-10-CM | POA: Diagnosis not present
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
## 2024-04-05 ENCOUNTER — Ambulatory Visit: Payer: Self-pay

## 2024-04-05 ENCOUNTER — Encounter: Payer: Self-pay | Admitting: Oncology
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
## 2024-04-06 ENCOUNTER — Ambulatory Visit (INDEPENDENT_AMBULATORY_CARE_PROVIDER_SITE_OTHER): Admitting: Physician Assistant

## 2024-04-06 ENCOUNTER — Encounter: Payer: Self-pay | Admitting: Physician Assistant

## 2024-04-06 VITALS — BP 131/65 | HR 89 | Resp 16 | Ht 69.0 in | Wt 206.3 lb

## 2024-04-06 DIAGNOSIS — R4189 Other symptoms and signs involving cognitive functions and awareness: Secondary | ICD-10-CM

## 2024-04-06 DIAGNOSIS — E1142 Type 2 diabetes mellitus with diabetic polyneuropathy: Secondary | ICD-10-CM | POA: Diagnosis not present

## 2024-04-06 DIAGNOSIS — E782 Mixed hyperlipidemia: Secondary | ICD-10-CM

## 2024-04-06 DIAGNOSIS — I152 Hypertension secondary to endocrine disorders: Secondary | ICD-10-CM

## 2024-04-06 DIAGNOSIS — G620 Drug-induced polyneuropathy: Secondary | ICD-10-CM | POA: Diagnosis not present

## 2024-04-06 DIAGNOSIS — E1159 Type 2 diabetes mellitus with other circulatory complications: Secondary | ICD-10-CM | POA: Diagnosis not present

## 2024-04-06 DIAGNOSIS — T451X5D Adverse effect of antineoplastic and immunosuppressive drugs, subsequent encounter: Secondary | ICD-10-CM | POA: Diagnosis not present

## 2024-04-06 DIAGNOSIS — K219 Gastro-esophageal reflux disease without esophagitis: Secondary | ICD-10-CM

## 2024-04-06 DIAGNOSIS — C119 Malignant neoplasm of nasopharynx, unspecified: Secondary | ICD-10-CM | POA: Diagnosis not present

## 2024-04-06 MED ORDER — ROSUVASTATIN CALCIUM 40 MG PO TABS: 40.0000 mg | ORAL_TABLET | Freq: Every day | ORAL | 3 refills | Status: AC

## 2024-04-06 MED ORDER — LISINOPRIL 10 MG PO TABS
10.0000 mg | ORAL_TABLET | Freq: Every day | ORAL | 1 refills | Status: AC
Start: 2024-04-06 — End: ?

## 2024-04-06 MED ORDER — AMITRIPTYLINE HCL 75 MG PO TABS: 75.0000 mg | ORAL_TABLET | Freq: Every day | ORAL | 1 refills | Status: DC

## 2024-04-06 NOTE — Progress Notes (Signed)
 Established patient visit  Patient: [name]   DOB: Oct 27, 1950   73 y.o. Male  MRN: [id number] Visit Date: 04/06/2024  Today's healthcare provider: Jolynn Spencer, PA-C   Chief Complaint  Patient presents with   Memory Loss    Worsening memory; saw neurologist 03/22/24.   Medication Refill   Subjective      Discussed the use of AI scribe software for clinical note transcription with the patient, who gave verbal consent to proceed.  History of Present Illness [name] is a 73 year old male who presents for medication refills.  He has severe burning neuropathic pain in his feet, which he attributes to prior chemotherapy, and takes gabapentin  900 mg twice daily and amitriptyline  for pain control.  He has Alzheimer's dementia confirmed by neurology and notes ongoing memory decline. He saw his neurologist on March 22, 2024 and takes prescribed vitamins.  He is here to refill Crestor  40 mg for hyperlipidemia, lisinopril  for hypertension, and omeprazole  for acid reflux.  He had throat cancer treated with radiation and has chronic dry mouth that he manages with Biotene mouthwash.  During review of systems he notes swelling in both feet and mentions prior foot hardware in one foot. He denies frequent headaches.       01/26/2024   10:33 AM 10/10/2023    1:04 PM 06/25/2023   11:45 AM  Depression screen PHQ 2/9  Decreased Interest 2 3 0  Down, Depressed, Hopeless 2 2 0  PHQ - 2 Score 4 5 0  Altered sleeping 3 2 2   Tired, decreased energy 0 2 0  Change in appetite 0 3 0  Feeling bad or failure about yourself  0 3 0  Trouble concentrating 0 3 0  Moving slowly or fidgety/restless 0 3 0  Suicidal thoughts 0 0 0  PHQ-9 Score 7  21  2    Difficult doing work/chores Somewhat difficult  Somewhat difficult     Data saved with a previous flowsheet row definition      01/26/2024   10:33 AM 10/10/2023    1:04 PM 06/25/2023   11:45 AM 01/24/2023     1:38 PM  GAD 7 : Generalized Anxiety Score  Nervous, Anxious, on Edge 3 2 1 1   Control/stop worrying 2 2 0 1  Worry too much - different things 2 2 0 0  Trouble relaxing 0 2 0 1  Restless 0 1 0 1  Easily annoyed or irritable 0 1 0 1  Afraid - awful might happen 0 0 0 2  Total GAD 7 Score 7 10 1 7   Anxiety Difficulty Somewhat difficult  Somewhat difficult Somewhat difficult    Medications: Outpatient Medications Prior to Visit  Medication Sig   acetaminophen  (TYLENOL ) 500 MG tablet Take 2 tablets (1,000 mg total) by mouth every 6 (six) hours as needed for mild pain (pain score 1-3).   amitriptyline  (ELAVIL ) 75 MG tablet Take 1 tablet (75 mg total) by mouth at bedtime.   amLODipine  (NORVASC ) 10 MG tablet Take 1 tablet (10 mg total) by mouth daily.   antiseptic oral rinse (BIOTENE) LIQD 15 mLs by Mouth Rinse route as needed for dry mouth.   aspirin  EC 81 MG tablet Take 81 mg by mouth daily. Swallow whole.   cevimeline  (EVOXAC ) 30 MG capsule Take 1 capsule (30 mg total) by mouth 3 (three) times daily. For dry mouth   Cholecalciferol 50 MCG (2000 UT) TABS Take 2,000 Units by mouth.  cyanocobalamin  1000 MCG tablet Take 1 tablet (1,000 mcg total) by mouth daily.   gabapentin  (NEURONTIN ) 300 MG capsule Take 2 capsules (600 mg total) by mouth 3 (three) times daily.   lisinopril  (ZESTRIL ) 10 MG tablet TAKE ONE TABLET BY MOUTH ONCE DAILY   nystatin  (MYCOSTATIN ) 100000 UNIT/ML suspension Take by mouth.   omeprazole  (PRILOSEC) 40 MG capsule TAKE 1 CAPSULE BY MOUTH ONCE DAILY   rosuvastatin  (CRESTOR ) 40 MG tablet Take 1 tablet (40 mg total) by mouth daily.   [DISCONTINUED] ammonium lactate  (AMLACTIN DAILY) 12 % lotion Apply 1 Application topically as needed.   [DISCONTINUED] magic mouthwash (nystatin , diphenhydrAMINE , alum & mag hydroxide) suspension mixture Swish and spit 5 mLs 3 (three) times daily as needed for mouth pain.   Facility-Administered Medications Prior to Visit  Medication Dose  Route Frequency Provider   heparin  lock flush 100 unit/mL  500 Units Intravenous Once Melanee Annah BROCKS, MD    Review of Systems All negative Except see HPI       Objective    BP 131/65   Pulse 89   Resp 16   Ht 5' 9 (1.753 m)   Wt 206 lb 4.8 oz (93.6 kg)   SpO2 97%   BMI 30.47 kg/m     Physical Exam Vitals reviewed.  Constitutional:      General: He is not in acute distress.    Appearance: Normal appearance. He is not diaphoretic.  HENT:     Head: Normocephalic and atraumatic.  Eyes:     General: No scleral icterus.    Conjunctiva/sclera: Conjunctivae normal.  Cardiovascular:     Rate and Rhythm: Normal rate and regular rhythm.     Pulses: Normal pulses.     Heart sounds: Normal heart sounds. No murmur heard. Pulmonary:     Effort: Pulmonary effort is normal. No respiratory distress.     Breath sounds: Normal breath sounds. No wheezing or rhonchi.  Musculoskeletal:     Cervical back: Neck supple.     Right lower leg: No edema.     Left lower leg: No edema.  Lymphadenopathy:     Cervical: No cervical adenopathy.  Skin:    General: Skin is warm and dry.     Findings: No rash.  Neurological:     Mental Status: He is alert and oriented to person, place, and time. Mental status is at baseline.  Psychiatric:        Mood and Affect: Mood normal.        Behavior: Behavior normal.      No results found for any visits on 04/06/24.      Assessment & Plan Hypertension associated with diabetes (HCC) Chronic and stable Continue current regimen including lisinopril  10mg  - lisinopril  (ZESTRIL ) 10 MG tablet; Take 1 tablet (10 mg total) by mouth daily.  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  Dispense: 30 tablet; Refill: 1  Diabetic polyneuropathy managed with gabapentin  and amitriptyline . - Continue gabapentin  900 mg twice daily for neuropathy. - Continue amitriptyline  for neuropathic pain. Might need a reassessment by Neurology. Per chart review, duloxetine  was recommended for consideration Will follow-up with pcp  Mixed hyperlipidemia (Primary) Chronic and stable, continue current regimen -  rosuvastatin  (CRESTOR ) 40 MG tablet; Take 1 tablet (40 mg total) by mouth daily.  Dispense: 90 tablet; Refill: 3 Continue lifestyle modifications Will follow-up  Cognitive decline due to early dementia and Alzheimer's disease 6 cit score was 8 today. Managed by neurology. Recent follow-up with Dr. Maree. - Continue follow-up with neurology as scheduled.  Gastroesophageal reflux disease Chronic Managed with omeprazole . - Continue omeprazole  for GERD. 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 Using Biotene for dry mouth secondary to radiation. - Continue Biotene mouthwash for dry mouth.  Hypertension associated with diabetes (HCC) - lisinopril  (ZESTRIL ) 10 MG tablet; Take 1 tablet (10 mg total) by mouth daily.  Dispense: 90 tablet; Refill: 1  Diabetic polyneuropathy associated with type 2 diabetes mellitus (HCC) - amitriptyline  (ELAVIL ) 75 MG tablet; Take 1 tablet (75 mg total) by mouth at bedtime.  Dispense: 30 tablet; Refill: 1  Mixed hyperlipidemia (Primary) - rosuvastatin  (CRESTOR ) 40 MG tablet; Take 1 tablet (40 mg total) by mouth daily.  Dispense: 90 tablet; Refill: 3  No orders of the defined types were placed in this encounter.   No follow-ups on file.   The patient was advised to call back or seek an in-person evaluation if the symptoms worsen or  if the condition fails to improve as anticipated.  I discussed the assessment and treatment plan with the patient. The patient was provided an opportunity to ask questions and all were answered. The patient agreed with the plan and demonstrated an understanding of the instructions.  I, Yanisa Goodgame, PA-C have reviewed all documentation for this visit. The documentation on 04/06/2024  for the exam, diagnosis, procedures, and orders are all accurate and complete.  Jolynn Spencer, [facility], MMS [facility] [phone] (phone) [phone] (fax)  [facility]

## 2024-04-22 NOTE — Progress Notes (Deleted)
 MRN : [id number]  [name] is a 73 y.o. ([birth year]) male who presents with chief complaint of check carotid arteries.  History of Present Illness:   The patient is seen for evaluation of carotid stenosis. The carotid stenosis was identified after duplex ultrasound was obtained October 16, 2023.   This study is reviewed by me and demonstrates moderate 50 to 69% stenosis of the right internal carotid artery and 40 to 59% stenosis of the left internal carotid artery   The patient denies amaurosis fugax. There is no recent history of TIA symptoms or focal motor deficits. There is no prior documented CVA.   There is no history of migraine headaches. There is no history of seizures.   The patient is taking enteric-coated aspirin  81 mg daily.   No recent shortening of the patient's walking distance or new symptoms consistent with claudication.  No history of rest pain symptoms. No new ulcers or wounds of the lower extremities have occurred.   There is no history of DVT, PE or superficial thrombophlebitis. No recent episodes of angina or shortness of breath documented.   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    COPD (chronic obstructive pulmonary disease) (HCC)    Dementia without behavioral disturbance (HCC) 02/16/2023   Depression 08/19/2017   Hepatitis C    Hypertension    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HCC)    Seizure disorder (HCC) 11/29/2013   Seizures (HCC) 2010   Sleep apnea    Syncope    questionable vasovagal    Past Surgical History:  Procedure Laterality Date   COLONOSCOPY WITH PROPOFOL  N/A 12/16/2016   Procedure: COLONOSCOPY WITH PROPOFOL ;  Surgeon: Gaylyn Gladis PENNER, MD;  Location: [facility] ENDOSCOPY;  Service: Endoscopy;  Laterality: N/A;   COLONOSCOPY WITH  PROPOFOL  N/A 03/14/2017   Procedure: COLONOSCOPY WITH PROPOFOL ;  Surgeon: Gaylyn Gladis PENNER, MD;  Location: [facility] ENDOSCOPY;  Service: Endoscopy;  Laterality: N/A;   COLONOSCOPY WITH PROPOFOL      DIALYSIS/PERMA CATHETER INSERTION N/A 07/18/2023   Procedure: DIALYSIS/PERMA CATHETER INSERTION;  Surgeon: Marea Selinda RAMAN, MD;  Location: [facility] INVASIVE CV LAB;  Service: Cardiovascular;  Laterality: N/A;   DIALYSIS/PERMA CATHETER REMOVAL N/A 08/11/2023   Procedure: DIALYSIS/PERMA CATHETER REMOVAL;  Surgeon: Marea Selinda RAMAN, MD;  Location: [facility] INVASIVE CV LAB;  Service: Cardiovascular;  Laterality: N/A;   FOOT SURGERY Right 03/12/2002   FOOT SURGERY     HEMORRHOID SURGERY     LITHOTRIPSY     MYRINGOTOMY     MYRINGOTOMY WITH TUBE PLACEMENT Left 04/28/2019   Procedure: MYRINGOTOMY WITH TUBE PLACEMENT;  Surgeon: Milissa Hamming, MD;  Location: [facility];  Service: ENT;  Laterality: Left;   NASOPHARYNGOSCOPY N/A 04/28/2019   Procedure: NASOPHARYNGOSCOPY WITH BIOPSY;  Surgeon: Milissa Hamming, MD;  Location: [facility]ERY [facility];  Service: ENT;  Laterality: N/A;   NASOPHARYNGOSCOPY     Port a cath Placement     PORTA CATH INSERTION N/A 05/20/2019   Procedure: PORTA CATH INSERTION;  Surgeon: Marea Selinda RAMAN, MD;  Location: [facility] INVASIVE CV LAB;  Service: Cardiovascular;  Laterality: N/A;   PORTA CATH REMOVAL N/A 11/29/2020   Procedure: PORTA CATH REMOVAL;  Surgeon: Marea Selinda RAMAN, MD;  Location: [facility] INVASIVE CV LAB;  Service: Cardiovascular;  Laterality: N/A;   TEMPORARY DIALYSIS CATHETER N/A 07/11/2023   Procedure: TEMPORARY DIALYSIS CATHETER;  Surgeon: Jama Cordella MATSU, MD;  Location: [facility] INVASIVE CV LAB;  Service: Cardiovascular;  Laterality: N/A;   TONSILLECTOMY      Social History Social History[2]  Family History Family History  Problem Relation Age of Onset   Arthritis Mother    Hypertension Mother    Cancer Father    Kidney disease Father    Alcohol abuse Paternal Aunt    Depression  Maternal Grandfather     Allergies[3]   REVIEW OF SYSTEMS (Negative unless checked)  Constitutional: [] Weight loss  [] Fever  [] Chills Cardiac: [] Chest pain   [] Chest pressure   [] Palpitations   [] Shortness of breath when laying flat   [] Shortness of breath with exertion. Vascular:  [x] Pain in legs with walking   [] Pain in legs at rest  [] History of DVT   [] Phlebitis   [] Swelling in legs   [] Varicose veins   [] Non-healing ulcers Pulmonary:   [] Uses home oxygen    [] Productive cough   [] Hemoptysis   [] Wheeze  [] COPD   [] Asthma Neurologic:  [] Dizziness   [] Seizures   [] History of stroke   [] History of TIA  [] Aphasia   [] Vissual changes   [] Weakness or numbness in arm   [] Weakness or numbness in leg Musculoskeletal:   [] Joint swelling   [] Joint pain   [] Low back pain Hematologic:  [] Easy bruising  [] Easy bleeding   [] Hypercoagulable state   [] Anemic Gastrointestinal:  [] Diarrhea   [] Vomiting  [] Gastroesophageal reflux/heartburn   [] Difficulty swallowing. Genitourinary:  [] Chronic kidney disease   [] Difficult urination  [] Frequent urination   [] Blood in urine Skin:  [] Rashes   [] Ulcers  Psychological:  [] History of anxiety   []  History of major depression.  Physical Examination  There were no vitals filed for this visit. There is no height or weight on file to calculate BMI. Gen: WD/WN, NAD Head: [place]/AT, No temporalis wasting.  Ear/Nose/Throat: Hearing grossly intact, nares w/o erythema or drainage Eyes: PER, EOMI, sclera nonicteric.  Neck: Supple, no masses.  No bruit or JVD.  Pulmonary:  Good air movement, no audible wheezing, no use of accessory muscles.  Cardiac: RRR, normal S1, S2, no Murmurs. Vascular:  carotid bruit noted Vessel Right Left  Radial Palpable Palpable  Carotid  Palpable  Palpable  Subclav  Palpable Palpable  Gastrointestinal: soft, non-distended. No guarding/no peritoneal signs.  Musculoskeletal: M/S 5/5 throughout.  No visible deformity.  Neurologic: CN 2-12  intact. Pain and light touch intact in extremities.  Symmetrical.  Speech is fluent. Motor exam as listed above. Psychiatric: Judgment intact, Mood & affect appropriate for pt's clinical situation. Dermatologic: No rashes or ulcers noted.  No changes consistent with cellulitis.   CBC Lab Results  Component Value Date   WBC 6.6 01/19/2024   HGB 14.2 01/19/2024   HCT 43 01/19/2024   MCV 94.1 07/20/2023   PLT 160 01/19/2024    BMET    Component Value Date/Time   NA 136 (A) 01/19/2024 0000   NA 139 12/31/2013 1207   K 4.5 01/19/2024 0000   K 3.8 12/31/2013 1207   CL 99 01/19/2024 0000   CL 101 12/31/2013 1207   CO2 25 07/22/2023 0410   CO2 28 12/31/2013 1207   GLUCOSE 96 07/22/2023 0410   GLUCOSE 134 (H) 12/31/2013 1207   BUN 12 01/19/2024 0000   BUN 8  12/31/2013 1207   CREATININE 1.2 01/19/2024 0000   CREATININE 3.05 (H) 07/22/2023 0410   CREATININE 1.03 12/31/2013 1207   CALCIUM  9.1 01/19/2024 0000   CALCIUM  8.8 12/31/2013 1207   GFRNONAA 21 (L) 07/22/2023 0410   GFRNONAA >60 12/31/2013 1207   GFRAA >60 12/11/2019 0531   GFRAA >60 12/31/2013 1207   CrCl cannot be calculated (Patient's most recent lab result is older than the maximum 21 days allowed.).  COAG Lab Results  Component Value Date   INR 1.1 07/09/2023   INR 1.0 09/25/2022   INR 1.2 05/04/2022    Radiology No results found.   Assessment/Plan There are no diagnoses linked to this encounter.   Cordella Shawl, MD  04/22/2024 1:05 PM      [1]  No outpatient medications have been marked as taking for the 04/26/24 encounter (Appointment) with Shawl, Cordella MATSU, MD.  [2]  Social History Tobacco Use   Smoking status: Every Day    Current packs/day: 0.50    Average packs/day: 0.5 packs/day for 50.4 years (25.2 ttl pk-yrs)    Types: Cigarettes    Start date: 04/04/1972    Last attempt to quit: 04/04/2022    Passive exposure: Past   Smokeless tobacco: Never  Vaping Use   Vaping status:  Never Used  Substance Use Topics   Alcohol use: Not Currently    Alcohol/week: 14.0 standard drinks of alcohol    Types: 14 Cans of beer per week   Drug use: Not Currently  [3]  Allergies Allergen Reactions   Opana [Oxymorphone Hcl]     Made him BLACKOUT   Elemental Sulfur     Childhood reaction    Sulfa Antibiotics Other (See Comments)   Sulfa Antibiotics

## 2024-04-23 ENCOUNTER — Telehealth (INDEPENDENT_AMBULATORY_CARE_PROVIDER_SITE_OTHER): Payer: Self-pay

## 2024-04-23 NOTE — Telephone Encounter (Signed)
 Patient called into nurse line wanting a call back to reschedule appointments. Please advise

## 2024-04-26 ENCOUNTER — Encounter (INDEPENDENT_AMBULATORY_CARE_PROVIDER_SITE_OTHER)

## 2024-04-26 ENCOUNTER — Ambulatory Visit (INDEPENDENT_AMBULATORY_CARE_PROVIDER_SITE_OTHER): Admitting: Vascular Surgery

## 2024-04-27 ENCOUNTER — Ambulatory Visit

## 2024-05-03 ENCOUNTER — Other Ambulatory Visit (INDEPENDENT_AMBULATORY_CARE_PROVIDER_SITE_OTHER): Payer: Self-pay | Admitting: Vascular Surgery

## 2024-05-03 DIAGNOSIS — I6523 Occlusion and stenosis of bilateral carotid arteries: Secondary | ICD-10-CM

## 2024-05-07 ENCOUNTER — Ambulatory Visit: Admitting: Family Medicine

## 2024-05-07 ENCOUNTER — Encounter: Payer: Self-pay | Admitting: Family Medicine

## 2024-05-07 VITALS — BP 122/75 | HR 89 | Temp 98.3°F | Ht 69.0 in | Wt 203.0 lb

## 2024-05-07 DIAGNOSIS — G620 Drug-induced polyneuropathy: Secondary | ICD-10-CM | POA: Diagnosis not present

## 2024-05-07 DIAGNOSIS — Y842 Radiological procedure and radiotherapy as the cause of abnormal reaction of the patient, or of later complication, without mention of misadventure at the time of the procedure: Secondary | ICD-10-CM | POA: Diagnosis not present

## 2024-05-07 DIAGNOSIS — E782 Mixed hyperlipidemia: Secondary | ICD-10-CM | POA: Diagnosis not present

## 2024-05-07 DIAGNOSIS — K117 Disturbances of salivary secretion: Secondary | ICD-10-CM

## 2024-05-07 DIAGNOSIS — F17200 Nicotine dependence, unspecified, uncomplicated: Secondary | ICD-10-CM

## 2024-05-07 DIAGNOSIS — T451X5D Adverse effect of antineoplastic and immunosuppressive drugs, subsequent encounter: Secondary | ICD-10-CM

## 2024-05-07 DIAGNOSIS — I152 Hypertension secondary to endocrine disorders: Secondary | ICD-10-CM | POA: Diagnosis not present

## 2024-05-07 DIAGNOSIS — E1159 Type 2 diabetes mellitus with other circulatory complications: Secondary | ICD-10-CM | POA: Diagnosis not present

## 2024-05-07 DIAGNOSIS — Z0000 Encounter for general adult medical examination without abnormal findings: Secondary | ICD-10-CM | POA: Diagnosis not present

## 2024-05-07 DIAGNOSIS — G4733 Obstructive sleep apnea (adult) (pediatric): Secondary | ICD-10-CM | POA: Insufficient documentation

## 2024-05-07 DIAGNOSIS — R413 Other amnesia: Secondary | ICD-10-CM

## 2024-05-07 MED ORDER — AMITRIPTYLINE HCL 75 MG PO TABS: 75.0000 mg | ORAL_TABLET | Freq: Every day | ORAL | 3 refills | Status: AC

## 2024-05-07 MED ORDER — GABAPENTIN 300 MG PO CAPS: 600.0000 mg | ORAL_CAPSULE | Freq: Four times a day (QID) | ORAL | 3 refills | Status: AC

## 2024-05-07 NOTE — Assessment & Plan Note (Signed)
 Chronic neuropathy in feet, worsening since May, likely caused by chemotherapy and radiation. Current gabapentin  dose is insufficient for symptom control; it was previously reduced due to an acute kidney injury. Kidney function has improved, allowing for dose adjustment. - Referred to podiatrist at previous visit for foot care. - Increase gabapentin  to 600 mg 4 times daily - Continue amitriptyline  unchanged

## 2024-05-07 NOTE — Addendum Note (Signed)
 Addended by: DONZELLA DOMINO on: 05/07/2024 12:18 PM   Modules accepted: Level of Service

## 2024-05-07 NOTE — Assessment & Plan Note (Signed)
 COPD with no current use of inhalers or home oxygen . Breathing is well-managed without nocturnal symptoms. - Referred for lung cancer screening.

## 2024-05-07 NOTE — Assessment & Plan Note (Signed)
 Chronic dry mouth secondary to radiation therapy. Current management includes Biotene and cevimeline , but symptoms persist. - Continue Biotene and cevimeline .

## 2024-05-07 NOTE — Assessment & Plan Note (Addendum)
 Blood pressure is well-controlled on current regimen of lisinopril  and amlodipine . - Continue lisinopril  10 mg daily. - Continue amlodipine  10 mg daily.

## 2024-05-07 NOTE — Assessment & Plan Note (Signed)
 Chronic, stable.  Continue rosuvastatin  40 mg daily.

## 2024-05-07 NOTE — Assessment & Plan Note (Signed)
 Memory and speech problems suggestive of possible dementia. Blood pressure, cholesterol, and blood sugar control are important for management.  Currently under evaluation by neurology. - Continue current medications for blood pressure and cholesterol control. - Follow-up with neurology in May 2026 as planned; defer to specialist management.

## 2024-05-07 NOTE — Patient Instructions (Addendum)
 [name],  Thank you for taking the time for your Medicare Wellness Visit. I appreciate your continued commitment to your health goals. Please review the care plan we discussed, and feel free to reach out if I can assist you further.  Please note that Annual Wellness Visits do not include a physical exam. Some assessments may be limited, especially if the visit was conducted virtually. If needed, we may recommend an in-person follow-up with your provider.  Ongoing Care Seeing your primary care provider every 3 to 6 months helps us  monitor your health and provide consistent, personalized care.   Referrals If a referral was made during today's visit and you haven't received any updates within two weeks, please contact the referred provider directly to check on the status.  Recommended Screenings:  Health Maintenance  Topic Date Due   Complete foot exam   Never done   Eye exam for diabetics  02/06/2018   Screening for Lung Cancer  12/14/2022   Medicare Annual Wellness Visit  10/22/2023   Hemoglobin A1C  12/23/2023   Zoster (Shingles) Vaccine (1 of 2) 05/07/2024*   Flu Shot  08/10/2024*   COVID-19 Vaccine (3 - Moderna risk series) 02/02/2025*   DTaP/Tdap/Td vaccine (1 - Tdap) 02/05/2025*   Colon Cancer Screening  03/15/2027   Pneumococcal Vaccine for age over 29  Completed   Hepatitis C Screening  Completed   Meningitis B Vaccine  Aged Out   Hepatitis B Vaccine  Discontinued  *Topic was postponed. The date shown is not the original due date.       11/20/2023    1:21 PM  Advanced Directives  Does Patient Have a Medical Advance Directive? No  Type of Estate Agent of [place];Living will  Copy of Healthcare Power of Attorney in Chart? No - copy requested    Vision: Your vision exam is scheduled at [facility]  on 07/23/23 at 10:30. Please contact them at [phone] if you have question or concerns in regards to this appointment. Annual vision  screenings are recommended for early detection of glaucoma, cataracts, and diabetic retinopathy. These exams can also reveal signs of chronic conditions such as diabetes and high blood pressure.   Dental: Annual dental screenings help detect early signs of oral cancer, gum disease, and other conditions linked to overall health, including heart disease and diabetes.  Please see the attached documents for additional preventive care recommendations.

## 2024-05-07 NOTE — Progress Notes (Signed)
 "     Established patient visit   Patient: [name]   DOB: 1950/07/12   73 y.o. Male  MRN: [id number] Visit Date: 05/07/2024  Today's healthcare provider: LAURAINE LOISE BUOY, DO   Chief Complaint  Patient presents with   Care Management    Ophthalmology Exam - [facility] 07/23/23 at 10:30 sent letter requesting information   Medical Management of Chronic Issues    Patient is here today for a 3 month follow up for chronic management.  Also states that he needs to get refills on some medications.   Subjective    HPI [name] is a 73 year old male with a history of cancer treated with chemotherapy and radiation in 2021 who presents with worsening foot pain and dry mouth.  He has been experiencing worsening neuropathy in his feet, which he attributes to chemotherapy received for cancer treatment in 2021. The foot pain has intensified since May 2025. He is currently taking gabapentin , three 300 mg capsules daily. He also takes amitriptyline  75 mg nightly, which aids sleep but not as effectively as desired. He recalls a significant fall in 1998, resulting in metal implants in his foot, which may contribute to his current symptoms.  He experiences significant dry mouth, which he attributes to radiation treatment that damaged his salivary glands. He uses Biotene gel, mouthwash and spray, and cevimeline  (Evoxac ) to manage these symptoms, although he feels these treatments are not fully effective.   He mentions a history of memory and speech problems and recalls being told he has onset of dementia. He is not currently on any medication for diabetes and takes blood pressure medications including lisinopril  and amlodipine . He also takes Norvasc , Crestor , vitamin D, and B12 supplements.  He has a history of COPD but does not use inhalers regularly and has not experienced recent breathing issues. He previously used oxygen  but no longer requires it. No swelling in  his legs, although he did experience swelling and discomfort in his right foot after a recent fall.  He notes the symptoms are improving. He uses a cane and, occasionally, a rollator for mobility.      Medications: Show/hide medication list[1]       Objective    BP 122/75 (BP Location: Right Arm, Patient Position: Sitting, Cuff Size: Large)   Pulse 89   Temp 98.3 F (36.8 C) (Oral)   Ht 5' 9 (1.753 m)   Wt 203 lb (92.1 kg)   SpO2 99%   BMI 29.98 kg/m     Physical Exam Vitals and nursing note reviewed.  Constitutional:      General: He is not in acute distress.    Appearance: Normal appearance.  HENT:     Head: Normocephalic and atraumatic.  Eyes:     General: No scleral icterus.    Conjunctiva/sclera: Conjunctivae normal.  Cardiovascular:     Rate and Rhythm: Normal rate.  Pulmonary:     Effort: Pulmonary effort is normal.  Neurological:     Mental Status: He is alert and oriented to person, place, and time. Mental status is at baseline.  Psychiatric:        Mood and Affect: Mood normal.        Behavior: Behavior normal.      Results for orders placed or performed in visit on 05/07/24  Comprehensive metabolic panel with GFR  Result Value Ref Range   eGFR 64     Assessment & Plan  Encounter for Medicare annual wellness exam  Chemotherapy-induced peripheral neuropathy Assessment & Plan: Chronic neuropathy in feet, worsening since May, likely caused by chemotherapy and radiation. Current gabapentin  dose is insufficient for symptom control; it was previously reduced due to an acute kidney injury. Kidney function has improved, allowing for dose adjustment. - Referred to podiatrist at previous visit for foot care. - Increase gabapentin  to 600 mg 4 times daily - Continue amitriptyline  unchanged  Orders: -     Gabapentin ; Take 2 capsules (600 mg total) by mouth 4 (four) times daily.  Dispense: 240 capsule; Refill: 3 -     Amitriptyline  HCl; Take 1 tablet  (75 mg total) by mouth at bedtime.  Dispense: 30 tablet; Refill: 3  Hypertension associated with diabetes (HCC) Assessment & Plan: Blood pressure is well-controlled on current regimen of lisinopril  and amlodipine . - Continue lisinopril  10 mg daily. - Continue amlodipine  10 mg daily.   Nicotine  dependence with current use Assessment & Plan: COPD with no current use of inhalers or home oxygen . Breathing is well-managed without nocturnal symptoms. - Referred for lung cancer screening.   Orders: -     Ambulatory Referral for Lung Cancer Scre  Mixed hyperlipidemia Assessment & Plan: Chronic, stable.  Continue rosuvastatin  40 mg daily.    Xerostomia due to radiotherapy Assessment & Plan: Chronic dry mouth secondary to radiation therapy. Current management includes Biotene and cevimeline , but symptoms persist. - Continue Biotene and cevimeline .   Memory changes Assessment & Plan: Memory and speech problems suggestive of possible dementia. Blood pressure, cholesterol, and blood sugar control are important for management.  Currently under evaluation by neurology. - Continue current medications for blood pressure and cholesterol control. - Follow-up with neurology in May 2026 as planned; defer to specialist management.    History of nasopharyngeal carcinoma  Cancer treated in 2021 with chemotherapy and radiation. Currently cancer-free with annual oncology follow-up.  No acute concerns. - Continue annual oncology follow-up.  Defer to specialist management.  General Health Maintenance Routine health maintenance discussed, including eye exams and weight management. - Schedule eye exam. - Encouraged continued weight management.    Return in about 3 months (around 08/05/2024) for Chronic f/u and in 6 months for [facility] w/next provider Pecola or Simmons-Robinson).      I discussed the assessment and treatment plan with the patient  The patient was provided an opportunity to ask  questions and all were answered. The patient agreed with the plan and demonstrated an understanding of the instructions.   The patient was advised to call back or seek an in-person evaluation if the symptoms worsen or if the condition fails to improve as anticipated.    LAURAINE LOISE BUOY, DO  [facility] [phone] (phone) [phone] (fax)  [facility]   __________________________________________________________________________    Chief Complaint  Patient presents with   Care Management    Ophthalmology Exam - [facility] 07/23/23 at 10:30 sent letter requesting information   Medical Management of Chronic Issues    Patient is here today for a 3 month follow up for chronic management.  Also states that he needs to get refills on some medications.     Subjective:   [name] is a 73 y.o. male who presents for a Medicare Annual Wellness Visit.  Visit info / Clinical Intake: Medicare Wellness Visit Type:: Subsequent Annual Wellness Visit Persons participating in visit and providing information:: patient Medicare Wellness Visit Mode:: In-person (required for [facility]) Interpreter  Needed?: No Pre-visit prep was completed: no AWV questionnaire completed by patient prior to visit?: no Living arrangements:: with family/others Typical amount of pain: (!) a lot Does pain affect daily life?: (!) yes Are you currently prescribed opioids?: no  Dietary Habits and Nutritional Risks How many meals a day?: 2 Eats fruit and vegetables daily?: (!) no Most meals are obtained by: preparing own meals; eating out In the last 2 weeks, have you had any of the following?: none Diabetic:: (!) yes Any non-healing wounds?: no How often do you check your BS?: 0 Would you like to be referred to a Nutritionist or for Diabetic Management? : no  Functional Status Activities of Daily Living (to include ambulation/medication):  Independent Ambulation: Independent Home Assistive Devices/Equipment: Cane Medication Administration: Independent Home Management (perform basic housework or laundry): Independent Manage your own finances?: yes Primary transportation is: driving Concerns about vision?: no *vision screening is required for WTM* Concerns about hearing?: no  Fall Screening Falls in the past year?: 1 Number of falls in past year: 1 Was there an injury with Fall?: 0 Fall Risk Category Calculator: 2 Patient Fall Risk Level: Moderate Fall Risk  Fall Risk Patient at Risk for Falls Due to: No Fall Risks Fall risk Follow up: Falls evaluation completed  Home and Transportation Safety: All rugs have non-skid backing?: yes All stairs or steps have railings?: yes Grab bars in the bathtub or shower?: (!) no Have non-skid surface in bathtub or shower?: yes Good home lighting?: yes Regular seat belt use?: yes Hospital stays in the last year:: (!) yes How many hospital stays:: 2 Reason: Kidneys and gallbladder, bronchitis  Cognitive Assessment Difficulty concentrating, remembering, or making decisions? : yes (States that he was told he may have some dementia, tends to not be able to get his words out.) Will 6CIT or Mini Cog be Completed: yes What year is it?: 0 points What month is it?: 0 points Give patient an address phrase to remember (5 components): [employer] [address] About what time is it?: 0 points Count backwards from 20 to 1: 0 points Say the months of the year in reverse: 0 points Repeat the address phrase from earlier: 2 points 6 CIT Score: 2 points  Advance Directives (For Healthcare) Does Patient Have a Medical Advance Directive?: Yes Type of Advance Directive: Healthcare Power of Attorney Copy of Healthcare Power of Attorney in Chart?: No - copy requested Copy of Living Will in Chart?: No - copy requested Would patient like information on creating a medical advance directive?: No  - Patient declined  Reviewed/Updated  Reviewed/Updated: Reviewed All (Medical, Surgical, Family, Medications, Allergies, Care Teams, Patient Goals)    Allergies (verified) Opana [oxymorphone hcl], Elemental sulfur, Sulfa antibiotics, and Sulfa antibiotics   Current Medications (verified) Outpatient Encounter Medications as of 05/07/2024  Medication Sig   acetaminophen  (TYLENOL ) 500 MG tablet Take 2 tablets (1,000 mg total) by mouth every 6 (six) hours as needed for mild pain (pain score 1-3).   amLODipine  (NORVASC ) 10 MG tablet Take 1 tablet (10 mg total) by mouth daily.   antiseptic oral rinse (BIOTENE) LIQD 15 mLs by Mouth Rinse route as needed for dry mouth.   aspirin  EC 81 MG tablet Take 81 mg by mouth daily. Swallow whole.   cevimeline  (EVOXAC ) 30 MG capsule Take 1 capsule (30 mg total) by mouth 3 (three) times daily. For dry mouth   Cholecalciferol 50 MCG (2000 UT) TABS Take 2,000 Units by mouth.   cyanocobalamin   1000 MCG tablet Take 1 tablet (1,000 mcg total) by mouth daily.   lisinopril  (ZESTRIL ) 10 MG tablet Take 1 tablet (10 mg total) by mouth daily.   nystatin  (MYCOSTATIN ) 100000 UNIT/ML suspension Take by mouth.   omeprazole  (PRILOSEC) 40 MG capsule TAKE 1 CAPSULE BY MOUTH ONCE DAILY   rosuvastatin  (CRESTOR ) 40 MG tablet Take 1 tablet (40 mg total) by mouth daily.   [DISCONTINUED] amitriptyline  (ELAVIL ) 75 MG tablet Take 1 tablet (75 mg total) by mouth at bedtime.   [DISCONTINUED] gabapentin  (NEURONTIN ) 300 MG capsule Take 2 capsules (600 mg total) by mouth 3 (three) times daily.   amitriptyline  (ELAVIL ) 75 MG tablet Take 1 tablet (75 mg total) by mouth at bedtime.   gabapentin  (NEURONTIN ) 300 MG capsule Take 2 capsules (600 mg total) by mouth 4 (four) times daily.   Facility-Administered Encounter Medications as of 05/07/2024  Medication   heparin  lock flush 100 unit/mL    History: Past Medical History:  Diagnosis Date   Alcohol abuse    Benzodiazepine dependence  (HCC)    Benzodiazepine withdrawal (HCC)    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   Chronic pain in right foot    Chronic respiratory failure with hypoxia (HCC) 05/04/2022   COPD (chronic obstructive pulmonary disease) (HCC)    Dementia without behavioral disturbance (HCC) 02/16/2023   Depression 08/19/2017   Hepatitis C    Hypertension    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HCC)    Seizure disorder (HCC) 11/29/2013   Seizures (HCC) 2010   Sleep apnea    Syncope    questionable vasovagal   Past Surgical History:  Procedure Laterality Date   COLONOSCOPY WITH PROPOFOL  N/A 12/16/2016   Procedure: COLONOSCOPY WITH PROPOFOL ;  Surgeon: Gaylyn Gladis PENNER, MD;  Location: [facility] ENDOSCOPY;  Service: Endoscopy;  Laterality: N/A;   COLONOSCOPY WITH PROPOFOL  N/A 03/14/2017   Procedure: COLONOSCOPY WITH PROPOFOL ;  Surgeon: Gaylyn Gladis PENNER, MD;  Location[facility] ENDOSCOPY;  Service: Endoscopy;  Laterality: N/A;   COLONOSCOPY WITH PROPOFOL      DIALYSIS/PERMA CATHETER INSERTION N/A 07/18/2023   Procedure: DIALYSIS/PERMA CATHETER INSERTION;  Surgeon: Marea Selinda RAMAN, MD;  Location: [facility] INVASIVE CV LAB;  Service: Cardiovascular;  Laterality: N/A;   DIALYSIS/PERMA CATHETER REMOVAL N/A 08/11/2023   Procedure: DIALYSIS/PERMA CATHETER REMOVAL;  Surgeon: Marea Selinda RAMAN, MD;  Location: [facility] INVASIVE CV LAB;  Service: Cardiovascular;  Laterality: N/A;   FOOT SURGERY Right 03/12/2002   FOOT SURGERY     HEMORRHOID SURGERY     LITHOTRIPSY     MYRINGOTOMY     MYRINGOTOMY WITH TUBE PLACEMENT Left 04/28/2019   Procedure: MYRINGOTOMY WITH TUBE PLACEMENT;  Surgeon: Milissa Hamming, MD;  Location: [facility] SURGERY [facility];  Service: ENT;  Laterality: Left;   NASOPHARYNGOSCOPY N/A 04/28/2019   Procedure: NASOPHARYNGOSCOPY WITH BIOPSY;  Surgeon: Milissa Hamming, MD;  Location: [facility] SURGERY CNTR;  Service: ENT;  Laterality: N/A;   NASOPHARYNGOSCOPY     Port a cath Placement     PORTA CATH INSERTION N/A 05/20/2019   Procedure: PORTA CATH  INSERTION;  Surgeon: Marea Selinda RAMAN, MD;  Location: [facility] INVASIVE CV LAB;  Service: Cardiovascular;  Laterality: N/A;   PORTA CATH REMOVAL N/A 11/29/2020   Procedure: PORTA CATH REMOVAL;  Surgeon: Marea Selinda RAMAN, MD;  Location: [facility] INVASIVE CV LAB;  Service: Cardiovascular;  Laterality: N/A;   TEMPORARY DIALYSIS CATHETER N/A 07/11/2023   Procedure: TEMPORARY DIALYSIS CATHETER;  Surgeon: Jama Cordella MATSU, MD;  Location: [facility] INVASIVE CV LAB;  Service: Cardiovascular;  Laterality:  N/A;   TONSILLECTOMY     Family History  Problem Relation Age of Onset   Arthritis Mother    Hypertension Mother    Cancer Father    Kidney disease Father    Alcohol abuse Paternal Aunt    Depression Maternal Grandfather    Social History   Occupational History   Not on file  Tobacco Use   Smoking status: Every Day    Current packs/day: 0.50    Average packs/day: 0.5 packs/day for 50.4 years (25.2 ttl pk-yrs)    Types: Cigarettes    Start date: 04/04/1972    Last attempt to quit: 04/04/2022    Passive exposure: Past   Smokeless tobacco: Never  Vaping Use   Vaping status: Never Used  Substance and Sexual Activity   Alcohol use: Not Currently    Alcohol/week: 14.0 standard drinks of alcohol    Types: 14 Cans of beer per week   Drug use: Not Currently   Sexual activity: Not Currently   Tobacco Counseling Ready to quit: Not Answered Counseling given: Not Answered  SDOH Screenings   Food Insecurity: No Food Insecurity (11/24/2023)   Received from [facility]  Housing: Low Risk  (11/24/2023)   Received from [facility]  Transportation Needs: No Transportation Needs (11/24/2023)   Received from [facility]  Utilities: Not At Risk (11/24/2023)   Received from [facility]  Alcohol Screen: Low Risk (10/28/2022)  Depression (PHQ2-9): Medium Risk (05/07/2024)  Financial Resource Strain: Low Risk  (11/24/2023)   Received from [facility]  Physical Activity: Inactive (10/22/2022)  Social Connections: Socially Isolated (07/10/2023)  Stress: Stress Concern Present (01/29/2023)  Tobacco Use: High Risk (05/07/2024)  Health Literacy: Adequate Health Literacy (10/10/2023)   See flowsheets for full screening details  Depression Screen PHQ 2 & 9 Depression Scale- Over the past 2 weeks, how often have you been bothered by any of the following problems? Little interest or pleasure in doing things: 1 Feeling down, depressed, or hopeless (PHQ Adolescent also includes...irritable): 0 PHQ-2 Total Score: 1 Trouble falling or staying asleep, or sleeping too much: 2 Feeling tired or having little energy: 0 Poor appetite or overeating (PHQ Adolescent also includes...weight loss): 0 Feeling bad about yourself - or that you are a failure or have let yourself or your family down: 0 Trouble concentrating on things, such as reading the newspaper or watching television (PHQ Adolescent also includes...like school work): 2 Moving or speaking so slowly that other people could have noticed. Or the opposite - being so fidgety or restless that you have been moving around a lot more than usual: 0 Thoughts that you would be better off dead, or of hurting yourself in some way: 0 PHQ-9 Total Score: 5 If you checked off any problems, how difficult have these problems made it for you to do your work, take care of things at home, or get along with other people?: Somewhat difficult     Goals Addressed   None        Objective:    Today's Vitals   05/07/24 1057  BP: 122/75  Pulse: 89  Temp: 98.3 F (36.8 C)  TempSrc: Oral  SpO2: 99%  Weight: 203 lb (92.1 kg)  Height: 5' 9 (1.753 m)   Body mass index is 29.98 kg/m.  Hearing/Vision screen No results found. Immunizations and Health Maintenance Health Maintenance  Topic Date Due   OPHTHALMOLOGY EXAM  02/06/2018  Lung Cancer Screening  12/14/2022   Medicare Annual  Wellness (AWV)  10/22/2023   HEMOGLOBIN A1C  12/23/2023   Zoster Vaccines- Shingrix (1 of 2) 05/07/2024 (Originally 09/29/1969)   Influenza Vaccine  08/10/2024 (Originally 12/12/2023)   COVID-19 Vaccine (3 - Moderna risk series) 02/02/2025 (Originally 01/08/2021)   DTaP/Tdap/Td (1 - Tdap) 02/05/2025 (Originally 09/29/1969)   FOOT EXAM  05/07/2025   Colonoscopy  03/15/2027   Pneumococcal Vaccine: 50+ Years  Completed   Hepatitis C Screening  Completed   Meningococcal B Vaccine  Aged Out   Hepatitis B Vaccines 19-59 Average Risk  Discontinued        Assessment/Plan:  This is a routine wellness examination for [name].  Patient Care Team: Donzella Lauraine SAILOR, DO as PCP - General (Family Medicine) Melanee Annah BROCKS, MD as Consulting Physician (Hematology and Oncology) Lenn Aran, MD as Radiation Oncologist (Radiation Oncology) Fernand Sigrid HERO, MD (Internal Medicine) Ermalinda Lenn HERO, LCSW as Social Worker Schnier, Cordella MATSU, MD (Vascular Surgery) Pa, [facility]  I have personally reviewed and noted the following in the patients chart:   Medical and social history Use of alcohol, tobacco or illicit drugs  Current medications and supplements including opioid prescriptions. Functional ability and status Nutritional status Physical activity Advanced directives List of other physicians Hospitalizations, surgeries, and ER visits in previous 12 months Vitals Screenings to include cognitive, depression, and falls Referrals and appointments Patient is not currently on any opioid medications.   Orders Placed This Encounter  Procedures   Comprehensive metabolic panel with GFR    This external order was created through the Results Console.   Ambulatory Referral for Lung Cancer Screening [[id number]]    Referral Priority:   Routine    Referral Type:   Consultation    Referral Reason:   Specialty Services Required    Number of Visits Requested:   1   In addition, I have reviewed  and discussed with patient certain preventive protocols, quality metrics, and best practice recommendations. A written personalized care plan for preventive services as well as general preventive health recommendations were provided to patient.   Zenon Leaf N Calleigh Lafontant, DO   05/07/2024   Return in about 3 months (around 08/05/2024) for Chronic f/u and in 6 months for [facility] w/next provider Pecola or Simmons-Robinson).  After Visit Summary: (In Person-Declined) Patient declined AVS at this time.     [1]  Outpatient Medications Prior to Visit  Medication Sig   acetaminophen  (TYLENOL ) 500 MG tablet Take 2 tablets (1,000 mg total) by mouth every 6 (six) hours as needed for mild pain (pain score 1-3).   amLODipine  (NORVASC ) 10 MG tablet Take 1 tablet (10 mg total) by mouth daily.   antiseptic oral rinse (BIOTENE) LIQD 15 mLs by Mouth Rinse route as needed for dry mouth.   aspirin  EC 81 MG tablet Take 81 mg by mouth daily. Swallow whole.   cevimeline  (EVOXAC ) 30 MG capsule Take 1 capsule (30 mg total) by mouth 3 (three) times daily. For dry mouth   Cholecalciferol 50 MCG (2000 UT) TABS Take 2,000 Units by mouth.   cyanocobalamin  1000 MCG tablet Take 1 tablet (1,000 mcg total) by mouth daily.   lisinopril  (ZESTRIL ) 10 MG tablet Take 1 tablet (10 mg total) by mouth daily.   nystatin  (MYCOSTATIN ) 100000 UNIT/ML suspension Take by mouth.   omeprazole  (PRILOSEC) 40 MG capsule TAKE 1 CAPSULE BY MOUTH ONCE DAILY   rosuvastatin  (CRESTOR ) 40 MG tablet Take 1 tablet (40  mg total) by mouth daily.   [DISCONTINUED] amitriptyline  (ELAVIL ) 75 MG tablet Take 1 tablet (75 mg total) by mouth at bedtime.   [DISCONTINUED] gabapentin  (NEURONTIN ) 300 MG capsule Take 2 capsules (600 mg total) by mouth 3 (three) times daily.   Facility-Administered Medications Prior to Visit  Medication Dose Route Frequency Provider   heparin  lock flush 100 unit/mL  500 Units Intravenous Once Rao, Archana C, MD   "

## 2024-05-10 ENCOUNTER — Ambulatory Visit (INDEPENDENT_AMBULATORY_CARE_PROVIDER_SITE_OTHER)

## 2024-05-10 ENCOUNTER — Encounter (INDEPENDENT_AMBULATORY_CARE_PROVIDER_SITE_OTHER): Payer: Self-pay | Admitting: Vascular Surgery

## 2024-05-10 ENCOUNTER — Ambulatory Visit (INDEPENDENT_AMBULATORY_CARE_PROVIDER_SITE_OTHER): Admitting: Vascular Surgery

## 2024-05-10 VITALS — BP 121/77 | HR 89 | Resp 18 | Wt 204.0 lb

## 2024-05-10 DIAGNOSIS — I10 Essential (primary) hypertension: Secondary | ICD-10-CM | POA: Diagnosis not present

## 2024-05-10 DIAGNOSIS — I6523 Occlusion and stenosis of bilateral carotid arteries: Secondary | ICD-10-CM

## 2024-05-10 DIAGNOSIS — J431 Panlobular emphysema: Secondary | ICD-10-CM

## 2024-05-10 DIAGNOSIS — E1159 Type 2 diabetes mellitus with other circulatory complications: Secondary | ICD-10-CM

## 2024-05-22 ENCOUNTER — Encounter (INDEPENDENT_AMBULATORY_CARE_PROVIDER_SITE_OTHER): Payer: Self-pay | Admitting: Vascular Surgery

## 2024-05-22 DIAGNOSIS — E119 Type 2 diabetes mellitus without complications: Secondary | ICD-10-CM | POA: Insufficient documentation

## 2024-05-22 NOTE — Progress Notes (Signed)
 "                         MRN : [id number]  [name] is a 74 y.o. (16-Sep-1950) male who presents with chief complaint of check carotid arteries.  History of Present Illness:   The patient is seen for evaluation of carotid stenosis. The carotid stenosis was initially identified after duplex ultrasound was obtained October 16, 2023.   The patient denies amaurosis fugax. There is no recent history of TIA symptoms or focal motor deficits. There is no prior documented CVA.   The patient is taking enteric-coated aspirin  81 mg daily.  He has raised concerns regarding his heart and whether he should have an evaluation.   No recent shortening of the patient's walking distance or new symptoms consistent with claudication.  No history of rest pain symptoms. No new ulcers or wounds of the lower extremities have occurred.   There is no history of DVT, PE or superficial thrombophlebitis.  Duplex ultrasound obtained today demonstrates 1 to 39% stenosis of the origins of the internal carotid arteries bilaterally.  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    Chronic respiratory failure with hypoxia (HCC) 05/04/2022   COPD (chronic obstructive pulmonary disease) (HCC)    Dementia without behavioral disturbance (HCC) 02/16/2023   Depression 08/19/2017   Hepatitis C    Hypertension    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HCC)    Seizure disorder (HCC) 11/29/2013   Seizures (HCC) 2010   Sleep apnea    Syncope    questionable vasovagal    Past Surgical History:  Procedure Laterality Date   COLONOSCOPY WITH PROPOFOL  N/A 12/16/2016   Procedure: COLONOSCOPY WITH PROPOFOL ;  Surgeon: Gaylyn Gladis PENNER, MD;  Location: [facility] ENDOSCOPY;  Service: Endoscopy;  Laterality: N/A;   COLONOSCOPY WITH PROPOFOL  N/A 03/14/2017   Procedure:  COLONOSCOPY WITH PROPOFOL ;  Surgeon: Gaylyn Gladis PENNER, MD;  Location: [facility] ENDOSCOPY;  Service: Endoscopy;  Laterality: N/A;   COLONOSCOPY WITH PROPOFOL      DIALYSIS/PERMA CATHETER INSERTION N/A 07/18/2023   Procedure: DIALYSIS/PERMA CATHETER INSERTION;  Surgeon: Marea Selinda RAMAN, MD;  Location: [facility] INVASIVE CV LAB;  Service: Cardiovascular;  Laterality: N/A;   DIALYSIS/PERMA CATHETER REMOVAL N/A 08/11/2023   Procedure: DIALYSIS/PERMA CATHETER REMOVAL;  Surgeon: Marea Selinda RAMAN, MD;  Location: [facility] INVASIVE CV LAB;  Service: Cardiovascular;  Laterality: N/A;   FOOT SURGERY Right 03/12/2002   FOOT SURGERY     HEMORRHOID SURGERY     LITHOTRIPSY     MYRINGOTOMY     MYRINGOTOMY WITH TUBE PLACEMENT Left 04/28/2019   Procedure: MYRINGOTOMY WITH TUBE PLACEMENT;  Surgeon: Milissa Hamming, MD;  Location: [facility];  Service: ENT;  Laterality: Left;   NASOPHARYNGOSCOPY N/A 04/28/2019   Procedure: NASOPHARYNGOSCOPY WITH BIOPSY;  Surgeon: Milissa Hamming, MD;  Location: [facility];  Service: ENT;  Laterality: N/A;   NASOPHARYNGOSCOPY     Port a cath Placement     PORTA CATH INSERTION N/A 05/20/2019   Procedure: PORTA CATH INSERTION;  Surgeon: Marea Selinda RAMAN, MD;  Location: [facility] INV[facility];  Service: Cardiovascular;  Laterality: N/A;   PORTA CATH REMOVAL N/A 11/29/2020   Procedure: PORTA CATH REMOVAL;  Surgeon: Marea Selinda RAMAN, MD;  Location: [facility] INV[facility] CV [facility];  Service: Cardiovascular;  Laterality: N/A;   TEMPORARY DIALYSIS CATHETER N/A 07/11/2023   Procedure: TEMPORARY DIALYSIS CATHETER;  Surgeon: Jama Cordella MATSU, MD;  Location: [facility] INVASIVE CV LAB;  Service: Cardiovascular;  Laterality: N/A;   TONSILLECTOMY      Social History Social History[2]  Family History Family History  Problem Relation Age of Onset   Arthritis Mother    Hypertension Mother    Cancer Father    Kidney disease Father    Alcohol abuse Paternal Aunt    Depression Maternal Grandfather      Allergies[3]   REVIEW OF SYSTEMS (Negative unless checked)  Constitutional: [] Weight loss  [] Fever  [] Chills Cardiac: [] Chest pain   [] Chest pressure   [] Palpitations   [] Shortness of breath when laying flat   [] Shortness of breath with exertion. Vascular:  [x] Pain in legs with walking   [] Pain in legs at rest  [] History of DVT   [] Phlebitis   [] Swelling in legs   [] Varicose veins   [] Non-healing ulcers Pulmonary:   [] Uses home oxygen    [] Productive cough   [] Hemoptysis   [] Wheeze  [] COPD   [] Asthma Neurologic:  [] Dizziness   [] Seizures   [] History of stroke   [] History of TIA  [] Aphasia   [] Vissual changes   [] Weakness or numbness in arm   [] Weakness or numbness in leg Musculoskeletal:   [] Joint swelling   [] Joint pain   [] Low back pain Hematologic:  [] Easy bruising  [] Easy bleeding   [] Hypercoagulable state   [] Anemic Gastrointestinal:  [] Diarrhea   [] Vomiting  [] Gastroesophageal reflux/heartburn   [] Difficulty swallowing. Genitourinary:  [] Chronic kidney disease   [] Difficult urination  [] Frequent urination   [] Blood in urine Skin:  [] Rashes   [] Ulcers  Psychological:  [] History of anxiety   []  History of major depression.  Physical Examination  Vitals:   05/10/24 1523  BP: 121/77  Pulse: 89  Resp: 18  Weight: 204 lb (92.5 kg)   Body mass index is 30.13 kg/m. Gen: WD/WN, NAD Head: [place]/AT, No temporalis wasting.  Ear/Nose/Throat: Hearing grossly intact, nares w/o erythema or drainage Eyes: PER, EOMI, sclera nonicteric.  Neck: Supple, no masses.  No bruit or JVD.  Pulmonary:  Good air movement, no audible wheezing, no use of accessory muscles.  Cardiac: RRR, normal S1, S2, no Murmurs. Vascular:  carotid bruit noted Vessel Right Left  Radial Palpable Palpable  Carotid  Palpable  Palpable  Gastrointestinal: soft, non-distended. No guarding/no peritoneal signs.  Musculoskeletal: M/S 5/5 throughout.  No visible deformity.  Neurologic: CN 2-12 intact. Pain and light touch  intact in extremities.  Symmetrical.  Speech is fluent. Motor exam as listed above. Psychiatric: Judgment intact, Mood & affect appropriate for pt's clinical situation. Dermatologic: No rashes or ulcers noted.  No changes consistent with cellulitis.   CBC Lab Results  Component Value Date   WBC 6.6 01/19/2024   HGB 14.2 01/19/2024   HCT 43 01/19/2024   MCV 94.1 07/20/2023   PLT 160 01/19/2024    BMET    Component Value Date/Time   NA 136 (A) 01/19/2024 0000   NA 139 12/31/2013 1207   K 4.5 01/19/2024 0000   K 3.8 12/31/2013 1207   CL 99 01/19/2024 0000   CL 101 12/31/2013 1207   CO2 25 07/22/2023 0410   CO2 28 12/31/2013 1207   GLUCOSE 96 07/22/2023 0410   GLUCOSE 134 (H) 12/31/2013 1207   BUN 12 01/19/2024 0000   BUN 8 12/31/2013 1207   CREATININE 1.2 01/19/2024 0000  CREATININE 3.05 (H) 07/22/2023 0410   CREATININE 1.03 12/31/2013 1207   CALCIUM  9.1 01/19/2024 0000   CALCIUM  8.8 12/31/2013 1207   GFRNONAA 21 (L) 07/22/2023 0410   GFRNONAA >60 12/31/2013 1207   GFRAA >60 12/11/2019 0531   GFRAA >60 12/31/2013 1207   CrCl cannot be calculated (Patient's most recent lab result is older than the maximum 21 days allowed.).  COAG Lab Results  Component Value Date   INR 1.1 07/09/2023   INR 1.0 09/25/2022   INR 1.2 05/04/2022    Radiology VAS US  CAROTID Result Date: 05/12/2024 Carotid Arterial Duplex Study Patient Name:  [name]  Date of Exam:   05/10/2024 Medical Rec #: [id number]                Accession #:    [id number] Date of Birth: 1951-01-19                Patient Gender: M Patient Age:   57 years Exam Location:  [facility] Procedure:      VAS US  CAROTID Referring Phys: CORDELLA SHAWL --------------------------------------------------------------------------------  Indications:  Carotid artery disease. Risk Factors: Hypertension, hyperlipidemia, current smoker. Performing Technologist: Donnice Charnley RVT  Examination Guidelines: A  complete evaluation includes B-mode imaging, spectral Doppler, color Doppler, and power Doppler as needed of all accessible portions of each vessel. Bilateral testing is considered an integral part of a complete examination. Limited examinations for reoccurring indications may be performed as noted.  Right Carotid Findings: +----------+--------+--------+--------+----------------------+--------+           PSV cm/sEDV cm/sStenosisPlaque Description    Comments +----------+--------+--------+--------+----------------------+--------+ CCA Prox  103     18                                             +----------+--------+--------+--------+----------------------+--------+ CCA Mid   114     28                                             +----------+--------+--------+--------+----------------------+--------+ CCA Distal110     24                                             +----------+--------+--------+--------+----------------------+--------+ ICA Prox  132     25      1-39%   calcific and irregular         +----------+--------+--------+--------+----------------------+--------+ ICA Mid   112     29                                             +----------+--------+--------+--------+----------------------+--------+ ICA Distal92      29                                             +----------+--------+--------+--------+----------------------+--------+ ECA       250             >50%  calcific and irregular         +----------+--------+--------+--------+----------------------+--------+ +----------+--------+-------+----------------+-------------------+           PSV cm/sEDV cmsDescribe        Arm Pressure (mmHG) +----------+--------+-------+----------------+-------------------+ Subclavian120            Multiphasic, WNL                    +----------+--------+-------+----------------+-------------------+ +---------+--------+--+--------+---------+ VertebralPSV  cm/s65EDV cm/sAntegrade +---------+--------+--+--------+---------+  Left Carotid Findings: +----------+--------+--------+--------+------------------+--------+           PSV cm/sEDV cm/sStenosisPlaque DescriptionComments +----------+--------+--------+--------+------------------+--------+ CCA Prox  138     35                                         +----------+--------+--------+--------+------------------+--------+ CCA Mid   173     37                                         +----------+--------+--------+--------+------------------+--------+ CCA Distal157     31                                         +----------+--------+--------+--------+------------------+--------+ ICA Prox  99      25      1-39%   calcific                   +----------+--------+--------+--------+------------------+--------+ ICA Mid   106     30                                         +----------+--------+--------+--------+------------------+--------+ ICA Distal116     24                                         +----------+--------+--------+--------+------------------+--------+ ECA       130                                                +----------+--------+--------+--------+------------------+--------+ +----------+--------+--------+----------------+-------------------+           PSV cm/sEDV cm/sDescribe        Arm Pressure (mmHG) +----------+--------+--------+----------------+-------------------+ [id number]             Multiphasic, WNL                    +----------+--------+--------+----------------+-------------------+ +---------+--------+--+--------+-+---------+ VertebralPSV cm/s53EDV cm/s9Antegrade +---------+--------+--+--------+-+---------+   Summary: Right Carotid: Velocities in the right ICA are consistent with a 1-39% stenosis.                The ECA appears >50% stenosed. Left Carotid: Velocities in the left ICA are consistent with a 1-39% stenosis.  Vertebrals:  Bilateral vertebral arteries demonstrate antegrade flow. Subclavians: Normal flow hemodynamics were seen in bilateral subclavian              arteries. *See table(s) above for measurements and observations.  Electronically signed by Cordella Shawl MD on  05/12/2024 at 7:58:50 AM.    Final      Assessment/Plan 1. Essential hypertension (Primary) Continue antihypertensive medications as already ordered, these medications have been reviewed and there are no changes at this time.Given his concerns regarding cardiac status I will place an ambulatory referral for cardiology. - Ambulatory referral to Cardiology  2. Bilateral carotid artery stenosis Recommend:   Given the patient's asymptomatic subcritical stenosis no further invasive testing or surgery at this time.   Duplex ultrasound shows 50% stenosis bilaterally.   Continue antiplatelet therapy as prescribed Continue management of CAD, HTN and Hyperlipidemia Healthy heart diet,  encouraged exercise at least 4 times per week   Follow up in 6 months with duplex ultrasound and physical exam  - VAS US  CAROTID; Future  3. Panlobular emphysema (HCC) Continue pulmonary medications and aerosols as already ordered, these medications have been reviewed and there are no changes at this time.   4. Type 2 diabetes mellitus with other circulatory complication, without long-term current use of insulin  (HCC) Continue hypoglycemic medications as already ordered, these medications have been reviewed and there are no changes at this time.  Hgb A1C to be monitored as already arranged by primary service     Cordella Shawl, MD  05/22/2024 12:11 PM      [1]  Current Meds  Medication Sig   acetaminophen  (TYLENOL ) 500 MG tablet Take 2 tablets (1,000 mg total) by mouth every 6 (six) hours as needed for mild pain (pain score 1-3).   amitriptyline  (ELAVIL ) 75 MG tablet Take 1 tablet (75 mg total) by mouth at bedtime.   amLODipine   (NORVASC ) 10 MG tablet Take 1 tablet (10 mg total) by mouth daily.   antiseptic oral rinse (BIOTENE) LIQD 15 mLs by Mouth Rinse route as needed for dry mouth.   aspirin  EC 81 MG tablet Take 81 mg by mouth daily. Swallow whole.   cevimeline  (EVOXAC ) 30 MG capsule Take 1 capsule (30 mg total) by mouth 3 (three) times daily. For dry mouth   Cholecalciferol 50 MCG (2000 UT) TABS Take 2,000 Units by mouth.   cyanocobalamin  1000 MCG tablet Take 1 tablet (1,000 mcg total) by mouth daily.   gabapentin  (NEURONTIN ) 300 MG capsule Take 2 capsules (600 mg total) by mouth 4 (four) times daily.   lisinopril  (ZESTRIL ) 10 MG tablet Take 1 tablet (10 mg total) by mouth daily.   nystatin  (MYCOSTATIN ) 100000 UNIT/ML suspension Take by mouth.   omeprazole  (PRILOSEC) 40 MG capsule TAKE 1 CAPSULE BY MOUTH ONCE DAILY   rosuvastatin  (CRESTOR ) 40 MG tablet Take 1 tablet (40 mg total) by mouth daily.  [2]  Social History Tobacco Use   Smoking status: Every Day    Current packs/day: 0.50    Average packs/day: 0.5 packs/day for 50.4 years (25.2 ttl pk-yrs)    Types: Cigarettes    Start date: 04/04/1972    Last attempt to quit: 04/04/2022    Passive exposure: Past   Smokeless tobacco: Never  Vaping Use   Vaping status: Never Used  Substance Use Topics   Alcohol use: Not Currently    Alcohol/week: 14.0 standard drinks of alcohol    Types: 14 Cans of beer per week   Drug use: Not Currently  [3]  Allergies Allergen Reactions   Opana [Oxymorphone Hcl]     Made him BLACKOUT   Elemental Sulfur     Childhood reaction    Sulfa Antibiotics Other (See Comments)   Sulfa Antibiotics    "

## 2024-06-21 ENCOUNTER — Ambulatory Visit: Admitting: Family Medicine

## 2024-08-11 ENCOUNTER — Ambulatory Visit: Admitting: Family Medicine

## 2024-11-08 ENCOUNTER — Encounter (INDEPENDENT_AMBULATORY_CARE_PROVIDER_SITE_OTHER)

## 2024-11-08 ENCOUNTER — Ambulatory Visit (INDEPENDENT_AMBULATORY_CARE_PROVIDER_SITE_OTHER): Admitting: Vascular Surgery

## 2024-11-10 ENCOUNTER — Ambulatory Visit
